# Patient Record
Sex: Male | Born: 1965 | Race: White | Hispanic: No | Marital: Married | Smoking: Never smoker
Health system: Southern US, Community
[De-identification: ages and names within clinical notes are randomized; demographics above are authoritative.]

## PROBLEM LIST (undated history)

## (undated) DIAGNOSIS — Z9842 Cataract extraction status, left eye: Secondary | ICD-10-CM

## (undated) DIAGNOSIS — F329 Major depressive disorder, single episode, unspecified: Secondary | ICD-10-CM

## (undated) DIAGNOSIS — M545 Low back pain, unspecified: Secondary | ICD-10-CM

## (undated) DIAGNOSIS — F419 Anxiety disorder, unspecified: Secondary | ICD-10-CM

## (undated) DIAGNOSIS — H3320 Serous retinal detachment, unspecified eye: Secondary | ICD-10-CM

## (undated) DIAGNOSIS — T7840XA Allergy, unspecified, initial encounter: Secondary | ICD-10-CM

## (undated) DIAGNOSIS — E785 Hyperlipidemia, unspecified: Secondary | ICD-10-CM

## (undated) DIAGNOSIS — F32A Depression, unspecified: Secondary | ICD-10-CM

## (undated) DIAGNOSIS — E039 Hypothyroidism, unspecified: Secondary | ICD-10-CM

## (undated) DIAGNOSIS — F410 Panic disorder [episodic paroxysmal anxiety] without agoraphobia: Secondary | ICD-10-CM

## (undated) DIAGNOSIS — Z9841 Cataract extraction status, right eye: Secondary | ICD-10-CM

## (undated) DIAGNOSIS — H269 Unspecified cataract: Secondary | ICD-10-CM

## (undated) DIAGNOSIS — M1990 Unspecified osteoarthritis, unspecified site: Secondary | ICD-10-CM

## (undated) DIAGNOSIS — R011 Cardiac murmur, unspecified: Secondary | ICD-10-CM

## (undated) HISTORY — DX: Allergy, unspecified, initial encounter: T78.40XA

## (undated) HISTORY — DX: Unspecified osteoarthritis, unspecified site: M19.90

## (undated) HISTORY — DX: Major depressive disorder, single episode, unspecified: F32.9

## (undated) HISTORY — DX: Anxiety disorder, unspecified: F41.9

## (undated) HISTORY — DX: Depression, unspecified: F32.A

## (undated) HISTORY — PX: CATARACT EXTRACTION: SUR2

## (undated) HISTORY — DX: Hyperlipidemia, unspecified: E78.5

## (undated) HISTORY — DX: Low back pain: M54.5

## (undated) HISTORY — PX: EYE SURGERY: SHX253

## (undated) HISTORY — DX: Cardiac murmur, unspecified: R01.1

## (undated) HISTORY — DX: Cataract extraction status, right eye: Z98.41

## (undated) HISTORY — DX: Unspecified cataract: H26.9

## (undated) HISTORY — DX: Hypothyroidism, unspecified: E03.9

## (undated) HISTORY — DX: Low back pain, unspecified: M54.50

## (undated) HISTORY — DX: Serous retinal detachment, unspecified eye: H33.20

## (undated) HISTORY — DX: Panic disorder (episodic paroxysmal anxiety): F41.0

## (undated) HISTORY — DX: Cataract extraction status, right eye: Z98.42

---

## 1998-05-22 ENCOUNTER — Emergency Department (HOSPITAL_COMMUNITY): Admission: EM | Admit: 1998-05-22 | Discharge: 1998-05-23 | Payer: Self-pay | Admitting: Emergency Medicine

## 2000-04-28 ENCOUNTER — Ambulatory Visit (HOSPITAL_COMMUNITY): Admission: RE | Admit: 2000-04-28 | Discharge: 2000-04-28 | Payer: Self-pay | Admitting: Internal Medicine

## 2000-04-28 ENCOUNTER — Encounter: Payer: Self-pay | Admitting: Internal Medicine

## 2005-06-12 ENCOUNTER — Ambulatory Visit: Payer: Self-pay | Admitting: Family Medicine

## 2006-07-09 ENCOUNTER — Ambulatory Visit: Payer: Self-pay | Admitting: Family Medicine

## 2007-04-13 ENCOUNTER — Ambulatory Visit: Payer: Self-pay | Admitting: Family Medicine

## 2007-04-13 LAB — CONVERTED CEMR LAB
AST: 23 units/L (ref 0–37)
Albumin: 4.3 g/dL (ref 3.5–5.2)
Alkaline Phosphatase: 55 units/L (ref 39–117)
BUN: 14 mg/dL (ref 6–23)
Basophils Absolute: 0.1 10*3/uL (ref 0.0–0.1)
Basophils Relative: 1.2 % — ABNORMAL HIGH (ref 0.0–1.0)
CO2: 28 meq/L (ref 19–32)
Chloride: 112 meq/L (ref 96–112)
Creatinine, Ser: 1.2 mg/dL (ref 0.4–1.5)
HCT: 45.7 % (ref 39.0–52.0)
Hemoglobin: 15.8 g/dL (ref 13.0–17.0)
Monocytes Absolute: 0.7 10*3/uL (ref 0.2–0.7)
Neutrophils Relative %: 59.6 % (ref 43.0–77.0)
Potassium: 4 meq/L (ref 3.5–5.1)
RBC: 4.95 M/uL (ref 4.22–5.81)
RDW: 12.3 % (ref 11.5–14.6)
Sodium: 142 meq/L (ref 135–145)
TSH: 15.05 microintl units/mL — ABNORMAL HIGH (ref 0.35–5.50)
Total Bilirubin: 0.9 mg/dL (ref 0.3–1.2)
Total CHOL/HDL Ratio: 5.5
Total Protein: 7.6 g/dL (ref 6.0–8.3)
Triglycerides: 92 mg/dL (ref 0–149)
VLDL: 18 mg/dL (ref 0–40)

## 2007-04-14 ENCOUNTER — Telehealth: Payer: Self-pay | Admitting: Family Medicine

## 2007-04-15 ENCOUNTER — Observation Stay (HOSPITAL_COMMUNITY): Admission: EM | Admit: 2007-04-15 | Discharge: 2007-04-16 | Payer: Self-pay | Admitting: Emergency Medicine

## 2007-04-20 ENCOUNTER — Telehealth (INDEPENDENT_AMBULATORY_CARE_PROVIDER_SITE_OTHER): Payer: Self-pay | Admitting: *Deleted

## 2007-04-29 ENCOUNTER — Encounter: Payer: Self-pay | Admitting: Family Medicine

## 2007-04-29 ENCOUNTER — Ambulatory Visit: Payer: Self-pay | Admitting: Family Medicine

## 2007-04-29 DIAGNOSIS — F411 Generalized anxiety disorder: Secondary | ICD-10-CM | POA: Insufficient documentation

## 2007-05-11 ENCOUNTER — Ambulatory Visit: Payer: Self-pay | Admitting: Family Medicine

## 2007-05-25 ENCOUNTER — Ambulatory Visit: Payer: Self-pay | Admitting: Family Medicine

## 2007-07-01 ENCOUNTER — Telehealth (INDEPENDENT_AMBULATORY_CARE_PROVIDER_SITE_OTHER): Payer: Self-pay | Admitting: *Deleted

## 2007-07-08 ENCOUNTER — Ambulatory Visit: Payer: Self-pay | Admitting: Family Medicine

## 2007-07-12 ENCOUNTER — Telehealth: Payer: Self-pay | Admitting: Family Medicine

## 2007-07-14 ENCOUNTER — Encounter: Payer: Self-pay | Admitting: Family Medicine

## 2007-07-14 LAB — CONVERTED CEMR LAB
Direct LDL: 156.1 mg/dL
TSH: 7.33 microintl units/mL — ABNORMAL HIGH (ref 0.35–5.50)
Total CHOL/HDL Ratio: 6.2
Triglycerides: 130 mg/dL (ref 0–149)

## 2008-01-11 ENCOUNTER — Telehealth: Payer: Self-pay | Admitting: Family Medicine

## 2008-02-07 ENCOUNTER — Telehealth: Payer: Self-pay | Admitting: Family Medicine

## 2008-07-26 ENCOUNTER — Telehealth: Payer: Self-pay | Admitting: Family Medicine

## 2008-08-16 ENCOUNTER — Ambulatory Visit: Payer: Self-pay | Admitting: Family Medicine

## 2008-08-16 LAB — CONVERTED CEMR LAB
Glucose, Urine, Semiquant: NEGATIVE
Nitrite: NEGATIVE
Specific Gravity, Urine: 1.015
WBC Urine, dipstick: NEGATIVE
pH: 8.5

## 2008-08-23 ENCOUNTER — Ambulatory Visit: Payer: Self-pay | Admitting: Family Medicine

## 2008-08-23 DIAGNOSIS — F419 Anxiety disorder, unspecified: Secondary | ICD-10-CM | POA: Insufficient documentation

## 2008-08-23 DIAGNOSIS — F32A Depression, unspecified: Secondary | ICD-10-CM | POA: Insufficient documentation

## 2008-08-23 DIAGNOSIS — F329 Major depressive disorder, single episode, unspecified: Secondary | ICD-10-CM

## 2008-08-23 LAB — CONVERTED CEMR LAB
AST: 22 units/L (ref 0–37)
Albumin: 4.4 g/dL (ref 3.5–5.2)
Alkaline Phosphatase: 48 units/L (ref 39–117)
BUN: 11 mg/dL (ref 6–23)
Basophils Relative: 0.1 % (ref 0.0–3.0)
Creatinine, Ser: 1.1 mg/dL (ref 0.4–1.5)
Eosinophils Relative: 1.9 % (ref 0.0–5.0)
GFR calc Af Amer: 94 mL/min
Glucose, Bld: 89 mg/dL (ref 70–99)
HCT: 45.1 % (ref 39.0–52.0)
HDL: 39 mg/dL (ref >39.0)
Hemoglobin: 16 g/dL (ref 13.0–17.0)
MCV: 91.4 fL (ref 78.0–100.0)
Monocytes Absolute: 0.6 10*3/uL (ref 0.1–1.0)
Monocytes Relative: 8.2 % (ref 3.0–12.0)
Platelets: 249 10*3/uL (ref 150–400)
Potassium: 4.2 meq/L (ref 3.5–5.1)
RBC: 4.94 M/uL (ref 4.22–5.81)
TSH: 6.94 microintl units/mL — ABNORMAL HIGH (ref 0.35–5.50)
Total CHOL/HDL Ratio: 5.1
Total Protein: 7.6 g/dL (ref 6.0–8.3)
WBC: 7.1 10*3/uL (ref 4.5–10.5)

## 2008-10-31 ENCOUNTER — Telehealth: Payer: Self-pay | Admitting: Family Medicine

## 2009-05-02 ENCOUNTER — Telehealth: Payer: Self-pay | Admitting: Family Medicine

## 2009-05-03 ENCOUNTER — Ambulatory Visit: Payer: Self-pay | Admitting: Family Medicine

## 2009-05-03 DIAGNOSIS — E785 Hyperlipidemia, unspecified: Secondary | ICD-10-CM | POA: Insufficient documentation

## 2009-05-03 DIAGNOSIS — F410 Panic disorder [episodic paroxysmal anxiety] without agoraphobia: Secondary | ICD-10-CM | POA: Insufficient documentation

## 2009-05-03 DIAGNOSIS — E039 Hypothyroidism, unspecified: Secondary | ICD-10-CM | POA: Insufficient documentation

## 2009-05-04 ENCOUNTER — Telehealth: Payer: Self-pay | Admitting: Family Medicine

## 2009-05-16 ENCOUNTER — Ambulatory Visit: Payer: Self-pay | Admitting: Family Medicine

## 2009-05-16 DIAGNOSIS — R519 Headache, unspecified: Secondary | ICD-10-CM | POA: Insufficient documentation

## 2009-05-16 DIAGNOSIS — R51 Headache: Secondary | ICD-10-CM | POA: Insufficient documentation

## 2009-05-16 DIAGNOSIS — S139XXA Sprain of joints and ligaments of unspecified parts of neck, initial encounter: Secondary | ICD-10-CM | POA: Insufficient documentation

## 2009-05-16 LAB — CONVERTED CEMR LAB: TSH: 2.69 microintl units/mL (ref 0.35–5.50)

## 2009-10-11 ENCOUNTER — Telehealth: Payer: Self-pay | Admitting: Family Medicine

## 2009-10-11 ENCOUNTER — Ambulatory Visit: Payer: Self-pay | Admitting: Family Medicine

## 2009-10-11 DIAGNOSIS — R413 Other amnesia: Secondary | ICD-10-CM | POA: Insufficient documentation

## 2009-11-13 ENCOUNTER — Telehealth: Payer: Self-pay | Admitting: Family Medicine

## 2009-12-19 ENCOUNTER — Telehealth: Payer: Self-pay | Admitting: Family Medicine

## 2010-03-28 ENCOUNTER — Telehealth: Payer: Self-pay | Admitting: Family Medicine

## 2010-04-02 ENCOUNTER — Ambulatory Visit: Payer: Self-pay | Admitting: Family Medicine

## 2010-04-09 ENCOUNTER — Ambulatory Visit: Payer: Self-pay | Admitting: Family Medicine

## 2010-04-09 DIAGNOSIS — M5136 Other intervertebral disc degeneration, lumbar region: Secondary | ICD-10-CM | POA: Insufficient documentation

## 2010-04-09 LAB — CONVERTED CEMR LAB
Albumin: 4.1 g/dL (ref 3.5–5.2)
Alkaline Phosphatase: 47 units/L (ref 39–117)
Basophils Absolute: 0 10*3/uL (ref 0.0–0.1)
Bilirubin, Direct: 0.1 mg/dL (ref 0.0–0.3)
CO2: 26 meq/L (ref 19–32)
Calcium: 9.2 mg/dL (ref 8.4–10.5)
Creatinine, Ser: 0.9 mg/dL (ref 0.4–1.5)
Eosinophils Absolute: 0.1 10*3/uL (ref 0.0–0.7)
Glucose, Bld: 86 mg/dL (ref 70–99)
Lymphocytes Relative: 26.3 % (ref 12.0–46.0)
MCHC: 34.4 g/dL (ref 30.0–36.0)
Neutrophils Relative %: 63.1 % (ref 43.0–77.0)
Platelets: 247 10*3/uL (ref 150.0–400.0)
RDW: 12.5 % (ref 11.5–14.6)
Total Bilirubin: 0.8 mg/dL (ref 0.3–1.2)

## 2010-04-11 ENCOUNTER — Telehealth: Payer: Self-pay | Admitting: Family Medicine

## 2010-04-18 ENCOUNTER — Ambulatory Visit: Payer: Self-pay | Admitting: Internal Medicine

## 2010-05-10 LAB — CONVERTED CEMR LAB
Glucose, Urine, Semiquant: NEGATIVE
Urobilinogen, UA: 0.2
WBC Urine, dipstick: NEGATIVE

## 2010-10-04 ENCOUNTER — Telehealth: Payer: Self-pay | Admitting: Family Medicine

## 2010-10-16 ENCOUNTER — Ambulatory Visit
Admission: RE | Admit: 2010-10-16 | Discharge: 2010-10-16 | Payer: Self-pay | Source: Home / Self Care | Attending: Family Medicine | Admitting: Family Medicine

## 2010-10-16 ENCOUNTER — Other Ambulatory Visit: Payer: Self-pay | Admitting: Family Medicine

## 2010-10-16 DIAGNOSIS — K589 Irritable bowel syndrome without diarrhea: Secondary | ICD-10-CM | POA: Insufficient documentation

## 2010-10-16 LAB — TSH: TSH: 11.67 u[IU]/mL — ABNORMAL HIGH (ref 0.35–5.50)

## 2010-10-25 ENCOUNTER — Telehealth: Payer: Self-pay | Admitting: Family Medicine

## 2010-11-14 NOTE — Progress Notes (Signed)
Summary: ? about labs  Phone Note Call from Patient Call back at Home Phone [phone]   Caller: Patient--live call Summary of Call: wants nurse to return call about his labwork. has questions. Initial call taken by: Warnell Forester,  October 25, 2010 4:16 PM  Follow-up for Phone Call        Will call results to patient Follow-up by: Judithann Sheen MD,  October 25, 2010 5:38 PM

## 2010-11-14 NOTE — Progress Notes (Signed)
Summary: [facility] CT SCAN OF HEAD  Phone Note Call from Patient Call back at Home Phone [phone]   Caller: Patient Call For: Judithann Sheen MD Summary of Call: PT STATED HE WAS SUPPOSE TO BE [facility] FOR   CT SCAN OF HEAD  DX INCREASE HEADACHES. Initial call taken by: Heron Sabins,  April 11, 2010 10:56 AM  Follow-up for Phone Call        Pt called and said that they just missed a call from someone at [facility], so he was returning the call.  Follow-up by: Lucy Antigua,  April 11, 2010 11:08 AM  Additional Follow-up for Phone Call Additional follow up Details #1::        Pt called and back and Almira Coaster said to tell pt to come by and pick up the script so that pt can mail it himself.  Additional Follow-up by: Lucy Antigua,  April 11, 2010 1:23 PM

## 2010-11-14 NOTE — Assessment & Plan Note (Signed)
Summary: med check/ok per Dr. Marlon Pel   Vital Signs:  Patient profile:   45 year old male Weight:      192 pounds O2 Sat:      96 % on Room air Temp:     100.4 degrees F oral Pulse rate:   92 / minute Pulse rhythm:   regular BP sitting:   140 / 98  (left arm) Cuff size:   regular  Vitals Entered By: Kern Reap CMA Duncan Dull) (October 16, 2010 1:04 PM)  O2 Flow:  Room air CC: follow-up visit, depression Is Patient Diabetic? No   History of Present Illness: repeat blood pressure 18/43 This 45 year old white ORIF male is in today very depressed also anxiety. 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 Would like to have his medications refilled that her due He continues to have back and leg pain and needs his Percocet refilled Spent approximately 30 minutes talking to him about his life and job situation as well as his girlfriend the patient over the last 2 weeks has had some increase bowel activity no true diarrhea but some discomfort over the lower abdomen  Allergies: 1)  ! Prozac 2)  ! * Lexpro 3)  ! * Antidepressants 4)  ! Effexor  Past History:  Past Surgical History: Last updated: 04/29/2007 Cataract extraction  Risk Factors: Smoking Status: never (04/09/2010)  Past Medical History: Anxiety panic attacks Low back pain Past retinal left eye at age 51 History of bilateral cataract hypothyroidism  Review of Systems      See HPI  The patient denies anorexia, fever, weight loss, weight gain, vision loss, decreased hearing, hoarseness, chest pain, syncope, dyspnea on exertion, peripheral edema, prolonged cough, headaches, hemoptysis, abdominal pain, melena, hematochezia, severe indigestion/heartburn, hematuria, incontinence, genital sores, muscle weakness, suspicious  skin lesions, transient blindness, difficulty walking, depression, unusual weight change, abnormal bleeding, enlarged lymph nodes, angioedema, breast masses, and testicular masses.    Physical Exam  General:  Well-developed,well-nourished,in no acute distress; alert,appropriate and cooperative throughout examination Head:  Normocephalic and atraumatic without obvious abnormalities. No apparent alopecia or balding. Eyes:  has had bilateral cataracts removed does continue to have some visual problem of long-standing Ears:  External ear exam shows no significant lesions or deformities.  Otoscopic examination reveals clear canals, tympanic membranes are intact bilaterally without bulging, retraction, inflammation or discharge. Hearing is grossly normal bilaterally. Nose:  External nasal examination shows no deformity or inflammation. Nasal mucosa are pink and moist without lesions or exudates. Mouth:  Oral mucosa and oropharynx without lesions or exudates.  Teeth in good repair. Neck:  No deformities, masses, or tenderness noted. Chest Wall:  No deformities, masses, tenderness or gynecomastia noted. Lungs:  Normal respiratory effort, chest expands symmetrically. Lungs are clear to auscultation, no crackles or wheezes. Heart:  Normal rate and regular rhythm. S1 and S2 normal without gallop, murmur, click, rub or other extra sounds. Abdomen:  increased bowel sounds minimal abdominal tenderness no masses liver spleen and kidneys are normal   Impression & Recommendations:  Problem # 1:  BACK PAIN, LUMBAR, CHRONIC (ICD-724.2) Assessment Unchanged  His updated medication list for this problem includes:    Adult Aspirin Ec Low Strength 81 Mg Tbec (Aspirin)    Endocet  10-650 Mg Tabs (Oxycodone-acetaminophen) .Marland Kitchen... 1 by mouth every 4-6 hrs as needed pain not to exceed 4 per day for back pain.  Problem # 2:  MEMORY LOSS (ICD-780.93) Assessment: Unchanged  Problem # 3:  HEADACHE  (ICD-784.0) Assessment: Unchanged  His updated medication list for this problem includes:    Adult Aspirin Ec Low Strength 81 Mg Tbec (Aspirin)    Endocet 10-650 Mg Tabs (Oxycodone-acetaminophen) .Marland Kitchen... 1 by mouth every 4-6 hrs as needed pain not to exceed 4 per day for back pain.  Problem # 4:  PANIC DISORDER (ICD-300.01) Assessment: Improved  His updated medication list for this problem includes:    Clonazepam 2 Mg Tabs (Clonazepam) .Marland Kitchen... 1 three times a day  Problem # 5:  HYPOTHYROIDISM (ICD-244.9) Assessment: Deteriorated  The following medications were removed from the medication list:    Synthroid 150 Mcg Tabs (Levothyroxine sodium) .Marland Kitchen... 1 qd His updated medication list for this problem includes:    Synthroid 175 Mcg Tabs (Levothyroxine sodium) ..... One tablet q.d.  Orders: Venipuncture (16109) Specimen Handling (60454) Venipuncture (09811) TLB-TSH (Thyroid Stimulating Hormone) (84443-TSH)  Problem # 6:  HYPERLIPIDEMIA (ICD-272.4) Assessment: Improved  His updated medication list for this problem includes:    Pravastatin Sodium 40 Mg Tabs (Pravastatin sodium) .Marland Kitchen... Take 1 tablet by mouth once a day  Problem # 7:  DEPRESSION (ICD-311) Assessment: Deteriorated  His updated medication list for this problem includes:    Clonazepam 2 Mg Tabs (Clonazepam) .Marland Kitchen... 1 three times a day  Problem # 8:  ANXIETY (ICD-300.00) Assessment: Unchanged  His updated medication list for this problem includes:    Clonazepam 2 Mg Tabs (Clonazepam) .Marland Kitchen... 1 three times a day  Problem # 9:  IRRITABLE BOWEL SYNDROME (ICD-564.1) Assessment: New Align 1 qd  Complete Medication List: 1)  Pravastatin Sodium 40 Mg Tabs (Pravastatin sodium) .... Take 1 tablet by mouth once a day 2)  Adult Aspirin Ec Low Strength 81 Mg Tbec (Aspirin) 3)  Clonazepam 2 Mg Tabs (Clonazepam) .Marland Kitchen.. 1 three times a day 4)  Endocet 10-650 Mg Tabs (Oxycodone-acetaminophen) .Marland Kitchen.. 1 by mouth every 4-6 hrs as needed pain  not to exceed 4 per day for back pain. 5)  Daily-vitamin Tabs (Multiple vitamin) .... Once daily 6)  Cialis 5 Mg Tabs (Tadalafil) .Marland Kitchen.. 1 tab qd 7)  Viagra 100 Mg Tabs (Sildenafil citrate) .Marland Kitchen.. 1  tab 1 hr before loving 8)  Synthroid 175 Mcg Tabs (Levothyroxine sodium) .... One tablet q.d.  Patient Instructions: 1)  2 increase Synthroid 175 mark round q.d. 2)  4 IVS decreased roughage in diet as well as start taking a lign 3)  Continue regular medications Prescriptions: SYNTHROID 175 MCG TABS (LEVOTHYROXINE SODIUM) one tablet q.d.  #90 x 3   Entered and Authorized by:   Judithann Sheen MD   Signed by:   Judithann Sheen MD on 10/25/2010   Method used:   Faxed to ...       [facility] (mail-order)             , [place]         Ph:        Fax: [phone]   RxID:   [id number] VIAGRA 100 MG TABS (SILDENAFIL CITRATE) 1  tab 1 hr before loving  #15 x 3   Entered and Authorized by:   Judithann Sheen MD   Signed by:   Judithann Sheen MD on 10/16/2010   Method  used:   Faxed to ...       [facility] (mail-order)             , [place]         Ph:        Fax: [phone]   RxID:   [id number] CIALIS 5 MG TABS (TADALAFIL) 1 tab qd  #30 x 11   Entered and Authorized by:   Judithann Sheen MD   Signed by:   Judithann Sheen MD on 10/16/2010   Method used:   Print then Give to Patient   RxID:   [id number] ENDOCET 10-650 MG TABS (OXYCODONE-ACETAMINOPHEN) 1 by mouth every 4-6 hrs as needed pain not to exceed 4 per day for back pain.  #360 x 0   Entered and Authorized by:   Judithann Sheen MD   Signed by:   Judithann Sheen MD on 10/16/2010   Method used:   Print then Give to Patient   RxID:   [id number] CLONAZEPAM 2 MG TABS (CLONAZEPAM) 1 three times a day  #270 x 0   Entered and Authorized by:   Judithann Sheen MD   Signed by:   Judithann Sheen MD on 10/16/2010   Method used:   Print then Give to Patient   RxID:    [id number]    Orders Added: 1)  Venipuncture [64332] 2)  Specimen Handling [99000] 3)  Venipuncture [36415] 4)  TLB-TSH (Thyroid Stimulating Hormone) [84443-TSH] 5)  Est. Patient Level IV [95188]

## 2010-11-14 NOTE — Progress Notes (Signed)
Summary: [facility] RX MAILED  Phone Note Call from Patient Call back at Home Phone [phone]   Caller: Patient Call For: Judithann Sheen MD Summary of Call: pt states endocet must be mail to [facility]  address [address] [place] , [place] [address]. PT WOULD LIKE A CALL BACK ONCE RX HAS BEEN MAILED.  Initial call taken by: Heron Sabins,  April 11, 2010 10:49 AM  Follow-up for Phone Call        ok pt to pick up rx  Follow-up by: Pura Spice, RN,  April 11, 2010 1:22 PM

## 2010-11-14 NOTE — Progress Notes (Signed)
Summary: new med  endocet x 1 only  Phone Note Call from Patient Call back at Home Phone [phone]   Caller: Patient Call For: Judithann Sheen MD Summary of Call: pt no longer want lorcet 10-650. Pt would  like endocet 10-650 same directions  as lorcet #300 for 90 day supply for [facility]. Pt will mail rx also pt is requesting to pick up rx today,pt is aware it may not be ready today. Initial call taken by: Heron Sabins,  December 19, 2009 2:40 PM  Follow-up for Phone Call        was the lorcet mail order for him and if so [facility] or [facility]  Follow-up by: Pura Spice, RN,  December 19, 2009 3:06 PM  Additional Follow-up for Phone Call Additional follow up Details #1::        The lorcet, was [facility], is not taking away the pain.  Is requesting endocet instead, which would also be [facility].  Says had hard copy for the lorcet, but he never got it filled, and when he was here, Dr. Scotty Court tried to send, but may have had wrong address.  He will come by for Rx or send to [facility], [address], [place], [place] [address].  Member ID [id number], his address.  [address] [place], [place] [address], DOB May 19, 2066, Medical Reason Back Pain must all be on Rx Oxycodone generic endocet 10-650mg  #300 for 90 day supply.  It will take him about 25 minutes to get here if you want him to send the Rx himself.  Rx cannot be faxed, it has to be mailed. Additional Follow-up by: Rudy Jew, RN,  December 19, 2009 3:30 PM    Additional Follow-up for Phone Call Additional follow up Details #2::    ok and can pick up today  Follow-up by: Pura Spice, RN,  December 20, 2009 8:10 AM  New/Updated Medications: CYMBALTA 60 MG CPEP (DULOXETINE HCL) 2 per day for depression ENDOCET 10-650 MG TABS (OXYCODONE-ACETAMINOPHEN) 1 by mouth every 4-6 hrs as needed pain not to exceed 4 per day Prescriptions: ENDOCET 10-650 MG TABS (OXYCODONE-ACETAMINOPHEN) 1 by mouth every 4-6 hrs as needed pain not to exceed 4  per day  #120 x 0   Entered by:   Pura Spice, RN   Authorized by:   Judithann Sheen MD   Signed by:   Pura Spice, RN on 12/20/2009   Method used:   Print then Give to Patient   RxID:   [id number]   Appended Document: new med  endocet x 1 only pt requested that this needs to be 90 day supply  rx reprinted for quanity 360.

## 2010-11-14 NOTE — Assessment & Plan Note (Signed)
Summary: CPX/CJR   Vital Signs:  Patient profile:   45 year old male Weight:      191 pounds O2 Sat:      98 % Temp:     98.8 degrees F Pulse rate:   95 / minute Pulse rhythm:   regular BP sitting:   120 / 80  (left arm) Cuff size:   large  Vitals Entered By: Pura Spice, RN (April 09, 2010 4:44 PM) CC: cpx bleeds with BM's    History of Present Illness: This 45 year old white male was in for complete physical examination planes of a stressful job\par Plan his main complaint is short-term memory loss which has increased in severity in addition he has had headaches for some time but they have HEENT crease in frequency and severity needing oxycodone to relieve his pain Past pain no lower back is stiff in the morning requiring him to set to function later passed out lifting heavy weights which he did in the past year he occasionally goes to a chiropractor who realized his spine or pops veins back in place according to the patient Clonazepam helps him very much as far as his stress since he is a very anxious and stressful individual He has continued to take his pravastatin and Synthroid but stopped Cymbalta  Preventive Screening-Counseling & Management  Alcohol-Tobacco     Smoking Status: never  Allergies: 1)  ! Prozac 2)  ! * Lexpro 3)  ! * Antidepressants 4)  ! Effexor  Past History:  Past Surgical History: Last updated: 04/29/2007 Cataract extraction  Risk Factors: Smoking Status: never (04/09/2010)  Past Medical History: Anxiety panic attacks Low back pain Past retinal left eye at age 79 History of bilateral cataract  Social History: Smoking Status:  never  Review of Systems      See HPI  The patient denies anorexia, fever, weight loss, weight gain, vision loss, decreased hearing, hoarseness, chest pain, syncope, dyspnea on exertion, peripheral edema, prolonged cough, headaches, hemoptysis, abdominal pain, melena, hematochezia, severe indigestion/heartburn,  hematuria, incontinence, genital sores, muscle weakness, suspicious skin lesions, transient blindness, difficulty walking, depression, unusual weight change, abnormal bleeding, enlarged lymph nodes, angioedema, breast masses, and testicular masses.    Physical Exam  General:  Well-developed,well-nourished,in no acute distress; alert,appropriate and cooperative throughout examination Head:  Normocephalic and atraumatic without obvious abnormalities. No apparent alopecia or balding. Eyes:  slight strabismus left pupil irregular due to previous surgery and also cataract removal years ago resulting from injury at one time Ears:  External ear exam shows no significant lesions or deformities.  Otoscopic examination reveals clear canals, tympanic membranes are intact bilaterally without bulging, retraction, inflammation or discharge. Hearing is grossly normal bilaterally. Nose:  External nasal examination shows no deformity or inflammation. Nasal mucosa are pink and moist without lesions or exudates. Mouth:  Oral mucosa and oropharynx without lesions or exudates.  Teeth in good repair. Neck:  No deformities, masses, or tenderness noted. Chest Wall:  No deformities, masses, tenderness or gynecomastia noted. Breasts:  No masses or gynecomastia noted Lungs:  Normal respiratory effort, chest expands symmetrically. Lungs are clear to auscultation, no crackles or wheezes. Heart:  Normal rate and regular rhythm. S1 and S2 normal without gallop, murmur, click, rub or other extra sounds. Abdomen:  Bowel sounds positive,abdomen soft and non-tender without masses, organomegaly or hernias noted. Rectal:  No external abnormalities noted. Normal sphincter tone. No rectal masses or tenderness. Genitalia:  Testes bilaterally descended without nodularity, tenderness or masses. No  scrotal masses or lesions. No penis lesions or urethral discharge. Prostate:  Prostate gland firm and smooth, no enlargement, nodularity,  tenderness, mass, asymmetry or induration. Msk:  No deformity or scoliosis noted of thoracic or lumbar spine.   Pulses:  R and L carotid,radial,femoral,dorsalis pedis and posterior tibial pulses are full and equal bilaterally Extremities:  No clubbing, cyanosis, edema, or deformity noted with normal full range of motion of all joints.   Neurologic:  No cranial nerve deficits noted. Station and gait are normal. Plantar reflexes are down-going bilaterally. DTRs are symmetrical throughout. Sensory, motor and coordinative functions appear intact. Skin:  Intact without suspicious lesions or rashes Cervical Nodes:  No lymphadenopathy noted Axillary Nodes:  No palpable lymphadenopathy Inguinal Nodes:  No significant adenopathy Psych:  Cognition and judgment appear intact. Alert and cooperative with normal attention span and concentration. No apparent delusions, illusions, hallucinations   Impression & Recommendations:  Problem # 1:  MEMORY LOSS (ICD-780.93) Assessment Deteriorated  Orders: Radiology Referral (Radiology)  Problem # 2:  HEADACHE (ICD-784.0) Assessment: Deteriorated  His updated medication list for this problem includes:    Adult Aspirin Ec Low Strength 81 Mg Tbec (Aspirin)    Endocet 10-650 Mg Tabs (Oxycodone-acetaminophen) .Marland Kitchen... 1 by mouth every 4-6 hrs as needed pain not to exceed 4 per day for back pain.  Orders: Radiology Referral (Radiology)  Problem # 3:  HYPERLIPIDEMIA (ICD-272.4) Assessment: Improved  His updated medication list for this problem includes:    Pravastatin Sodium 40 Mg Tabs (Pravastatin sodium) .Marland Kitchen... Take 1 tablet by mouth once a day  Problem # 4:  PANIC DISORDER (ICD-300.01) Assessment: Improved  The following medications were removed from the medication list:    Cymbalta 60 Mg Cpep (Duloxetine hcl) .Marland Kitchen... 2 per day for depression His updated medication list for this problem includes:    Clonazepam 2 Mg Tabs (Clonazepam) .Marland Kitchen... 1 three times a  day  Problem # 5:  PHYSICAL EXAMINATION (ICD-V70.0) Assessment: Unchanged  Problem # 6:  BACK PAIN, LUMBAR, CHRONIC (ICD-724.2) Assessment: Unchanged  His updated medication list for this problem includes:    Adult Aspirin Ec Low Strength 81 Mg Tbec (Aspirin)    Endocet 10-650 Mg Tabs (Oxycodone-acetaminophen) .Marland Kitchen... 1 by mouth every 4-6 hrs as needed pain not to exceed 4 per day for back pain.  Complete Medication List: 1)  Pravastatin Sodium 40 Mg Tabs (Pravastatin sodium) .... Take 1 tablet by mouth once a day 2)  Adult Aspirin Ec Low Strength 81 Mg Tbec (Aspirin) 3)  Synthroid 150 Mcg Tabs (Levothyroxine sodium) .Marland Kitchen.. 1 qd 4)  Clonazepam 2 Mg Tabs (Clonazepam) .Marland Kitchen.. 1 three times a day 5)  Endocet 10-650 Mg Tabs (Oxycodone-acetaminophen) .Marland Kitchen.. 1 by mouth every 4-6 hrs as needed pain not to exceed 4 per day for back pain.  Patient Instructions: 1)  impression is that since you're having increased memory loss and increased her headaches we should do it CT scan for full evaluation 2)  Continue her other medications as prescribed Prescriptions: ENDOCET 10-650 MG TABS (OXYCODONE-ACETAMINOPHEN) 1 by mouth every 4-6 hrs as needed pain not to exceed 4 per day for back pain.  #360 x 0   Entered by:   Pura Spice, RN   Authorized by:   Judithann Sheen MD   Signed by:   Pura Spice, RN on 04/11/2010   Method used:   Print then Give to Patient   RxID:   [id number] PRAVASTATIN SODIUM 40 MG TABS (PRAVASTATIN  SODIUM) Take 1 tablet by mouth once a day  #90 x 3   Entered and Authorized by:   Judithann Sheen MD   Signed by:   Judithann Sheen MD on 04/09/2010   Method used:   Electronically to        [facility] [address] (retail)       [phone]       [place], [place]  [address]       Ph: [phone]       Fax: [phone]   RxID:   [id number] SYNTHROID 150 MCG TABS (LEVOTHYROXINE SODIUM) 1 QD  #90 x 3   Entered and  Authorized by:   Judithann Sheen MD   Signed by:   Judithann Sheen MD on 04/09/2010   Method used:   Printed then faxed to ...       [facility] (mail-order)             , [place]         Ph:        Fax: [phone]   RxID:   [id number] CLONAZEPAM 2 MG TABS (CLONAZEPAM) 1 three times a day  #270 x 3   Entered and Authorized by:   Judithann Sheen MD   Signed by:   Judithann Sheen MD on 04/09/2010   Method used:   Printed then faxed to ...       [facility] (mail-order)             , [place]         Ph:        Fax: [phone]   RxID:   [id number] ENDOCET 10-650 MG TABS (OXYCODONE-ACETAMINOPHEN) 1 by mouth every 4-6 hrs as needed pain not to exceed 4 per day for back pain.  #360 x 0   Entered and Authorized by:   Judithann Sheen MD   Signed by:   Judithann Sheen MD on 04/09/2010   Method used:   Printed then faxed to ...       [facility] (mail-order)             , [place]         Ph:        Fax: [phone]   RxID:   [id number] CLONAZEPAM 2 MG TABS (CLONAZEPAM) 1 three times a day  #270 x 3   Entered and Authorized by:   Judithann Sheen MD   Signed by:   Judithann Sheen MD on 04/09/2010   Method used:   Printed then faxed to ...       [facility] (mail-order)             , [place]         Ph:        Fax: [phone]   RxID:   [id number] SYNTHROID 150 MCG TABS (LEVOTHYROXINE SODIUM) 1 QD  #90 x 3   Entered and Authorized by:   Judithann Sheen MD   Signed by:   Judithann Sheen MD on 04/09/2010   Method used:   F[facility]ed to ...       [facility]             , [place]         Ph:        Fax:  [phone]   RxID:   [id number]

## 2010-11-14 NOTE — Progress Notes (Signed)
Summary: Pt req Prevastatin-[facility],Endocet,Clonazepam,Synthroid-[facility]  Phone Note Call from Patient Call back at Home Phone [phone]   Caller: Patient Summary of Call: Pt called is req script for Prevastatin to [facility] on [address], then Edocet ,Clonazepam,  and Synthroid to [facility]. Pls notify pt when this has been done.  Initial call taken by: Lucy Antigua,  March 28, 2010 1:28 PM  Follow-up for Phone Call        this will need to be done by Dr. Scotty Court next week Follow-up by: Nelwyn Salisbury MD,  March 29, 2010 8:51 AM  Additional Follow-up for Phone Call Additional follow up Details #1::        jpt needs cpx in july yearly with lab studies to be done so we will do his rx at time of  cpx  if need them now will  do 1 month refill Additional Follow-up by: Pura Spice, RN,  April 01, 2010 9:39 AM    Additional Follow-up for Phone Call Additional follow up Details #2::    Pt has sch cpx labs for 04/02/10 and cpx on 04/09/10. Pt req a 3 month refill on these meds because just getting 1 month supply is too expensive. Getting 3 months saves money.    Follow-up by: Lucy Antigua,  April 01, 2010 10:24 AM  Additional Follow-up for Phone Call Additional follow up Details #3:: Details for Additional Follow-up Action Taken: spoke with pt having labs in am.  pt thinks has enough pills to last til cpx on june 28 11 and will call in pravastatin to [facility].  Additional Follow-up by: Pura Spice, RN,  April 01, 2010 10:59 AM  New/Updated Medications: PRAVASTATIN SODIUM 40 MG TABS (PRAVASTATIN SODIUM) Take 1 tablet by mouth once a day Prescriptions: PRAVASTATIN SODIUM 40 MG TABS (PRAVASTATIN SODIUM) Take 1 tablet by mouth once a day  #90 x 3   Entered by:   Pura Spice, RN   Authorized by:   Judithann Sheen MD   Signed by:   Pura Spice, RN on 04/01/2010   Method used:   Electronically to        [facility] [address] (retail)       [phone]       [place], [place]  [address]       Ph: [phone]       Fax: [phone]   RxID:   [id number]

## 2010-11-14 NOTE — Progress Notes (Signed)
Summary: Pt req higher dose of Cymbalta. Pt wants a return call asap  Phone Note Call from Patient Call back at Home Phone [phone]   Caller: Patient Summary of Call: Pt said that he would like a higher dose of Cymbalta. Pt would like Dr. Scotty Court to call him.  Initial call taken by: Lucy Antigua,  November 13, 2009 3:08 PM  Follow-up for Phone Call        Pt called to ck on status of phone call he made to [facility] on Tuesday, 11/13/2009..... Pt now advising that he would like to have some Cymbalta samples left up front for him because he is currently out of medicine..... Pt adv that he is currently OOT and would like to have Dr Scotty Court call him if possible.  Follow-up by: Debbra Riding,  November 15, 2009 2:19 PM     Appended Document: Pt req higher dose of Cymbalta. Pt wants a return call asap increase Cymbalta 120 mg per day, will fax a prescription to [facility]

## 2010-11-14 NOTE — Progress Notes (Signed)
Summary: REQUEST FOR APPT / PLEASE RETURN CALL  Phone Note Call from Patient Call back at Home Phone [phone]   Caller: Patient  [phone] Summary of Call: Pt called to adv that he needs to come in for a med ck /  refill appt before Jan. 20, 2012 (that is when his insurance will expire due to being layed off of work).... Pt would like to be worked into schedule before 11/01/10.... Pt would also like a return call to  #  [phone] - adv he needs to speak with Dr Scotty Court, would not elaborate further... Declined OV with any other physician for any acute needs?   Initial call taken by: Debbra Riding,  October 04, 2010 3:05 PM  Follow-up for Phone Call        ok to work in but he will need to ask questions at OV Follow-up by: Alfred Levins, CMA,  October 09, 2010 11:36 AM  Additional Follow-up for Phone Call Additional follow up Details #1::        I called and lft vm for pt to cb to sch work in ov with Dr Scotty Court. Waiting on call back.  Additional Follow-up by: Lucy Antigua,  October 09, 2010 12:00 PM    Additional Follow-up for Phone Call Additional follow up Details #2::    Pt returned my call and has been sch for rov med check appt for 10/16/10 at 1pm, as noted.  Follow-up by: Lucy Antigua,  October 09, 2010 1:22 PM

## 2010-12-11 ENCOUNTER — Telehealth: Payer: Self-pay | Admitting: Family Medicine

## 2010-12-11 NOTE — Telephone Encounter (Signed)
Triage vm-----checking status of forms to be completed for [facility] [employer]. Wants Dr Scotty Court to retun call.

## 2010-12-17 ENCOUNTER — Telehealth: Payer: Self-pay | Admitting: Family Medicine

## 2010-12-17 NOTE — Telephone Encounter (Signed)
Pt called to ck on paperwork ([employer]) that he dropped off week before last.... He called to ck on paperwork last week but no one returned his call .... Wants to know if it is ready and when he can come by to p/u the paperwork...Marland KitchenMarland Kitchen pts # [phone].

## 2011-02-13 ENCOUNTER — Other Ambulatory Visit: Payer: Self-pay

## 2011-02-13 MED ORDER — CLONAZEPAM 2 MG PO TABS: ORAL_TABLET | ORAL | Status: DC

## 2011-02-13 MED ORDER — LEVOTHYROXINE SODIUM 175 MCG PO TABS: 175.0000 ug | ORAL_TABLET | Freq: Every day | ORAL | Status: DC

## 2011-02-13 NOTE — Telephone Encounter (Signed)
Pt called and stated that he does not have insurance any more and that he needed for synthroid 175 #120 and Clonazepam 2mg  #270 ok per Dr. Scotty Court to call in to [facility]

## 2011-02-22 LAB — HM DIABETES EYE EXAM
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
## 2011-04-14 ENCOUNTER — Other Ambulatory Visit: Payer: Self-pay | Admitting: Family Medicine

## 2011-04-14 NOTE — Telephone Encounter (Signed)
Pt would like a call when his refill from [facility] is done.

## 2011-07-30 ENCOUNTER — Other Ambulatory Visit: Payer: Self-pay | Admitting: Family Medicine

## 2011-10-08 ENCOUNTER — Ambulatory Visit (INDEPENDENT_AMBULATORY_CARE_PROVIDER_SITE_OTHER): Payer: BC Managed Care – PPO | Admitting: Internal Medicine

## 2011-10-08 ENCOUNTER — Other Ambulatory Visit (INDEPENDENT_AMBULATORY_CARE_PROVIDER_SITE_OTHER): Payer: BC Managed Care – PPO

## 2011-10-08 ENCOUNTER — Encounter: Payer: Self-pay | Admitting: Internal Medicine

## 2011-10-08 DIAGNOSIS — M545 Low back pain, unspecified: Secondary | ICD-10-CM

## 2011-10-08 DIAGNOSIS — F410 Panic disorder [episodic paroxysmal anxiety] without agoraphobia: Secondary | ICD-10-CM

## 2011-10-08 DIAGNOSIS — E785 Hyperlipidemia, unspecified: Secondary | ICD-10-CM

## 2011-10-08 DIAGNOSIS — Z0000 Encounter for general adult medical examination without abnormal findings: Secondary | ICD-10-CM

## 2011-10-08 LAB — URINALYSIS, ROUTINE W REFLEX MICROSCOPIC
Leukocytes, UA: NEGATIVE
Nitrite: NEGATIVE
Specific Gravity, Urine: 1.025 (ref 1.000–1.030)
pH: 6 (ref 5.0–8.0)

## 2011-10-08 LAB — COMPREHENSIVE METABOLIC PANEL
Alkaline Phosphatase: 54 U/L (ref 39–117)
BUN: 13 mg/dL (ref 6–23)
Creatinine, Ser: 0.9 mg/dL (ref 0.4–1.5)
Glucose, Bld: 93 mg/dL (ref 70–99)
Sodium: 141 mEq/L (ref 135–145)
Total Bilirubin: 0.6 mg/dL (ref 0.3–1.2)

## 2011-10-08 LAB — TSH: TSH: 0.18 u[IU]/mL — ABNORMAL LOW (ref 0.35–5.50)

## 2011-10-08 LAB — HIV ANTIBODY (ROUTINE TESTING W REFLEX): HIV: NONREACTIVE

## 2011-10-08 LAB — CBC WITH DIFFERENTIAL/PLATELET
Basophils Relative: 0.3 % (ref 0.0–3.0)
Eosinophils Relative: 1.5 % (ref 0.0–5.0)
HCT: 44.5 % (ref 39.0–52.0)
Lymphs Abs: 2.1 10*3/uL (ref 0.7–4.0)
MCV: 91.6 fl (ref 78.0–100.0)
Monocytes Absolute: 0.6 10*3/uL (ref 0.1–1.0)
Platelets: 292 10*3/uL (ref 150.0–400.0)
WBC: 8.8 10*3/uL (ref 4.5–10.5)

## 2011-10-08 LAB — LIPID PANEL: HDL: 43.4 mg/dL (ref >39.00)

## 2011-10-08 MED ORDER — CLONAZEPAM 2 MG PO TABS: ORAL_TABLET | ORAL | Status: DC

## 2011-10-08 MED ORDER — PRAVASTATIN SODIUM 40 MG PO TABS: 40.0000 mg | ORAL_TABLET | Freq: Every day | ORAL | Status: DC

## 2011-10-08 MED ORDER — OXYCODONE-ACETAMINOPHEN 10-650 MG PO TABS: 1.0000 | ORAL_TABLET | Freq: Four times a day (QID) | ORAL | Status: DC | PRN

## 2011-10-08 NOTE — Patient Instructions (Signed)
Health Maintenance, Males A healthy lifestyle and preventative care can promote health and wellness.  Maintain regular health, dental, and eye exams.   Eat a healthy diet. Foods like vegetables, fruits, whole grains, low-fat dairy products, and lean protein foods contain the nutrients you need without too many calories. Decrease your intake of foods high in solid fats, added sugars, and salt. Get information about a proper diet from your caregiver, if necessary.   Regular physical exercise is one of the most important things you can do for your health. Most adults should get at least 150 minutes of moderate-intensity exercise (any activity that increases your heart rate and causes you to sweat) each week. In addition, most adults need muscle-strengthening exercises on 2 or more days a week.    Maintain a healthy weight. The body mass index (BMI) is a screening tool to identify possible weight problems. It provides an estimate of body fat based on height and weight. Your caregiver can help determine your BMI, and can help you achieve or maintain a healthy weight. For adults 20 years and older:   A BMI below 18.5 is considered underweight.   A BMI of 18.5 to 24.9 is normal.   A BMI of 25 to 29.9 is considered overweight.   A BMI of 30 and above is considered obese.   Maintain normal blood lipids and cholesterol by exercising and minimizing your intake of saturated fat. Eat a balanced diet with plenty of fruits and vegetables. Blood tests for lipids and cholesterol should begin at age 20 and be repeated every 5 years. If your lipid or cholesterol levels are high, you are over 50, or you are a high risk for heart disease, you may need your cholesterol levels checked more frequently.Ongoing high lipid and cholesterol levels should be treated with medicines, if diet and exercise are not effective.   If you smoke, find out from your caregiver how to quit. If you do not use tobacco, do not start.    If you choose to drink alcohol, do not exceed 2 drinks per day. One drink is considered to be 12 ounces (355 mL) of beer, 5 ounces (148 mL) of wine, or 1.5 ounces (44 mL) of liquor.   Avoid use of street drugs. Do not share needles with anyone. Ask for help if you need support or instructions about stopping the use of drugs.   High blood pressure causes heart disease and increases the risk of stroke. Blood pressure should be checked at least every 1 to 2 years. Ongoing high blood pressure should be treated with medicines if weight loss and exercise are not effective.   If you are 45 to 45 years old, ask your caregiver if you should take aspirin to prevent heart disease.   Diabetes screening involves taking a blood sample to check your fasting blood sugar level. This should be done once every 3 years, after age 45, if you are within normal weight and without risk factors for diabetes. Testing should be considered at a younger age or be carried out more frequently if you are overweight and have at least 1 risk factor for diabetes.   Colorectal cancer can be detected and often prevented. Most routine colorectal cancer screening begins at the age of 50 and continues through age 75. However, your caregiver may recommend screening at an earlier age if you have risk factors for colon cancer. On a yearly basis, your caregiver may provide home test kits to check for hidden   blood in the stool. Use of a small camera at the end of a tube, to directly examine the colon (sigmoidoscopy or colonoscopy), can detect the earliest forms of colorectal cancer. Talk to your caregiver about this at age 50, when routine screening begins. Direct examination of the colon should be repeated every 5 to 10 years through age 75, unless early forms of pre-cancerous polyps or small growths are found.   Healthy men should no longer receive prostate-specific antigen (PSA) blood tests as part of routine cancer screening. Consult with  your caregiver about prostate cancer screening.   Practice safe sex. Use condoms and avoid high-risk sexual practices to reduce the spread of sexually transmitted infections (STIs).   Use sunscreen with a sun protection factor (SPF) of 30 or greater. Apply sunscreen liberally and repeatedly throughout the day. You should seek shade when your shadow is shorter than you. Protect yourself by wearing long sleeves, pants, a wide-brimmed hat, and sunglasses year round, whenever you are outdoors.   Notify your caregiver of new moles or changes in moles, especially if there is a change in shape or color. Also notify your caregiver if a mole is larger than the size of a pencil eraser.   A one-time screening for abdominal aortic aneurysm (AAA) and surgical repair of large AAAs by sound wave imaging (ultrasonography) is recommended for ages 65 to 75 years who are current or former smokers.   Stay current with your immunizations.  Document Released: 03/27/2008 Document Revised: 06/11/2011 Document Reviewed: 02/24/2011 [employer] Patient Information 2012 [employer]. 

## 2011-10-08 NOTE — Progress Notes (Signed)
Subjective:    Patient ID: Jonathan Luna, male    DOB: 1966/08/18, 45 y.o.   MRN: [id number]  HPI  New to me for a complete physical, he wants an HIV test done. He needs meds refilled but states his medical issues are stable with no recent changes or worsening. He does not want any vaccines today b/c his insurance does not cover them.  Review of Systems  Constitutional: Negative for fever, chills, diaphoresis, activity change, appetite change, fatigue and unexpected weight change.  HENT: Negative.   Eyes: Negative.   Respiratory: Negative for cough, chest tightness, shortness of breath, wheezing and stridor.   Cardiovascular: Negative for chest pain, palpitations and leg swelling.  Gastrointestinal: Negative.   Genitourinary: Negative for dysuria, urgency, frequency, hematuria, flank pain, decreased urine volume, discharge, penile swelling, scrotal swelling, enuresis, difficulty urinating, genital sores, penile pain and testicular pain.  Musculoskeletal: Positive for back pain (chronic, unchanged). Negative for myalgias, joint swelling, arthralgias and gait problem.  Skin: Negative for color change, pallor, rash and wound.  Neurological: Negative.   Hematological: Negative for adenopathy. Does not bruise/bleed easily.  Psychiatric/Behavioral: Negative for suicidal ideas, hallucinations, behavioral problems, confusion, sleep disturbance, self-injury, dysphoric mood, decreased concentration and agitation. The patient is nervous/anxious (chronic anxiety and panic, no recent changes). The patient is not hyperactive.        Objective:   Physical Exam  Vitals reviewed. Constitutional: He appears well-developed and well-nourished. No distress.  HENT:  Head: Normocephalic and atraumatic.  Mouth/Throat: Oropharynx is clear and moist. No oropharyngeal exudate.  Eyes: Conjunctivae are normal. Right eye exhibits no discharge. Left eye exhibits no discharge. No scleral icterus.  Neck: Normal range of  motion. Neck supple. No JVD present. No tracheal deviation present. No thyromegaly present.  Cardiovascular: Normal rate, regular rhythm, normal heart sounds and intact distal pulses.  Exam reveals no gallop and no friction rub.   No murmur heard. Pulmonary/Chest: Effort normal and breath sounds normal. No stridor. No respiratory distress. He has no wheezes. He has no rales. He exhibits no tenderness.  Abdominal: Soft. Bowel sounds are normal. He exhibits no distension and no mass. There is no tenderness. There is no rebound and no guarding. Hernia confirmed negative in the right inguinal area and confirmed negative in the left inguinal area.  Genitourinary: Testes normal and penis normal. Right testis shows no mass, no swelling and no tenderness. Right testis is descended. Left testis shows no mass, no swelling and no tenderness. Left testis is descended. Circumcised. No penile tenderness. No discharge found.  Musculoskeletal: Normal range of motion. He exhibits no edema and no tenderness.       Lumbar back: Normal. He exhibits normal range of motion, no tenderness, no bony tenderness, no swelling, no edema, no deformity, no laceration, no pain, no spasm and normal pulse.  Lymphadenopathy:    He has no cervical adenopathy.       Right: No inguinal adenopathy present.       Left: No inguinal adenopathy present.  Neurological: He is alert. He has normal strength. He displays no atrophy, no tremor and normal reflexes. No cranial nerve deficit or sensory deficit. He exhibits normal muscle tone. He displays a negative Romberg sign. He displays no seizure activity. Coordination and gait normal. He displays no Babinski's sign on the right side. He displays no Babinski's sign on the left side.  Reflex Scores:      Tricep reflexes are 1+ on the right side and 1+ on  the left side.      Bicep reflexes are 1+ on the right side and 1+ on the left side.      Brachioradialis reflexes are 1+ on the right side and  1+ on the left side.      Patellar reflexes are 1+ on the right side and 1+ on the left side.      Achilles reflexes are 1+ on the right side and 1+ on the left side. Skin: Skin is warm and dry. No rash noted. He is not diaphoretic. No erythema. No pallor.  Psychiatric: He has a normal mood and affect. His behavior is normal. Judgment and thought content normal.      Lab Results  Component Value Date   WBC 8.8 10/08/2011   HGB 15.3 10/08/2011   HCT 44.5 10/08/2011   PLT 292.0 10/08/2011   GLUCOSE 93 10/08/2011   CHOL 181 10/08/2011   TRIG 109.0 10/08/2011   HDL 43.40 10/08/2011   LDLDIRECT 156.1 07/08/2007   LDLCALC 116* 10/08/2011   ALT 35 10/08/2011   AST 24 10/08/2011   NA 141 10/08/2011   K 4.6 10/08/2011   CL 104 10/08/2011   CREATININE 0.9 10/08/2011   BUN 13 10/08/2011   CO2 30 10/08/2011   TSH 0.18* 10/08/2011     Assessment & Plan:

## 2011-10-09 ENCOUNTER — Encounter: Payer: Self-pay | Admitting: Internal Medicine

## 2011-10-09 ENCOUNTER — Other Ambulatory Visit: Payer: Self-pay | Admitting: Internal Medicine

## 2011-10-09 DIAGNOSIS — E039 Hypothyroidism, unspecified: Secondary | ICD-10-CM

## 2011-10-09 MED ORDER — LEVOTHYROXINE SODIUM 125 MCG PO TABS: 125.0000 ug | ORAL_TABLET | Freq: Every day | ORAL | Status: DC

## 2011-10-09 NOTE — Assessment & Plan Note (Signed)
Exam done, vaccines not given at his request due to $ concerns, labs ordered, pt ed material was given

## 2012-01-15 ENCOUNTER — Encounter: Payer: Self-pay | Admitting: Internal Medicine

## 2012-01-15 ENCOUNTER — Other Ambulatory Visit (INDEPENDENT_AMBULATORY_CARE_PROVIDER_SITE_OTHER): Payer: BC Managed Care – PPO

## 2012-01-15 ENCOUNTER — Ambulatory Visit (INDEPENDENT_AMBULATORY_CARE_PROVIDER_SITE_OTHER): Payer: BC Managed Care – PPO | Admitting: Internal Medicine

## 2012-01-15 VITALS — BP 112/72 | HR 76 | Temp 98.2°F | Resp 16 | Ht 67.0 in | Wt 177.8 lb

## 2012-01-15 DIAGNOSIS — E039 Hypothyroidism, unspecified: Secondary | ICD-10-CM

## 2012-01-15 DIAGNOSIS — E785 Hyperlipidemia, unspecified: Secondary | ICD-10-CM

## 2012-01-15 DIAGNOSIS — M545 Low back pain, unspecified: Secondary | ICD-10-CM

## 2012-01-15 DIAGNOSIS — F410 Panic disorder [episodic paroxysmal anxiety] without agoraphobia: Secondary | ICD-10-CM

## 2012-01-15 DIAGNOSIS — F411 Generalized anxiety disorder: Secondary | ICD-10-CM

## 2012-01-15 LAB — COMPREHENSIVE METABOLIC PANEL
AST: 19 U/L (ref 0–37)
Alkaline Phosphatase: 50 U/L (ref 39–117)
BUN: 16 mg/dL (ref 6–23)
Calcium: 9.4 mg/dL (ref 8.4–10.5)
Creatinine, Ser: 0.9 mg/dL (ref 0.4–1.5)
Glucose, Bld: 87 mg/dL (ref 70–99)

## 2012-01-15 MED ORDER — LEVOTHYROXINE SODIUM 137 MCG PO TABS: 137.0000 ug | ORAL_TABLET | Freq: Every day | ORAL | Status: DC

## 2012-01-15 MED ORDER — OXYCODONE-ACETAMINOPHEN 10-650 MG PO TABS: 1.0000 | ORAL_TABLET | Freq: Four times a day (QID) | ORAL | Status: DC | PRN

## 2012-01-15 MED ORDER — CLONAZEPAM 2 MG PO TABS: ORAL_TABLET | ORAL | Status: DC

## 2012-01-15 MED ORDER — PRAVASTATIN SODIUM 40 MG PO TABS: 40.0000 mg | ORAL_TABLET | Freq: Every day | ORAL | Status: DC

## 2012-01-15 MED ORDER — LEVOTHYROXINE SODIUM 125 MCG PO TABS: 125.0000 ug | ORAL_TABLET | Freq: Every day | ORAL | Status: DC

## 2012-01-15 NOTE — Assessment & Plan Note (Signed)
Continue current meds 

## 2012-01-15 NOTE — Assessment & Plan Note (Signed)
Continue percocet as needed 

## 2012-01-15 NOTE — Assessment & Plan Note (Signed)
He is doing well on pravachol 

## 2012-01-15 NOTE — Patient Instructions (Signed)

## 2012-01-15 NOTE — Assessment & Plan Note (Signed)
His TSH is a little high so I made an increase in his synthroid dose
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
## 2012-01-22 ENCOUNTER — Telehealth: Payer: Self-pay

## 2012-01-22 DIAGNOSIS — F410 Panic disorder [episodic paroxysmal anxiety] without agoraphobia: Secondary | ICD-10-CM

## 2012-01-22 MED ORDER — CLONAZEPAM 2 MG PO TABS: ORAL_TABLET | ORAL | Status: DC

## 2012-01-22 NOTE — Telephone Encounter (Signed)
[facility] Triage Call Report Triage Record Num: [id number] Operator: Remonia Richter Patient Name: Jonathan Luna Call Date & Time: 01/21/2012 5:19:28PM Patient Phone: [phone] PCP: Sanda Linger Patient Gender: Male PCP Fax : Patient DOB: [birth year] Practice Name: [facility] Reason for Call: Caller: Maralyn Sago; PCP: Sanda Linger; CB#: [phone]; Call regarding ; [name] was written for 60 and he has been getting 90, she also has a question on his Percocet script and asking for clarification on the wording of the Percocet script,this was gone over with pharmacist per Epic record with voiced understanding, they will contact the office in am about the amount on the Klonapin Protocol(s) Used: Medication Questions - Adult Recommended Outcome per Protocol: Provided Health Information Reason for Outcome: Caller has medication question(s) that was answered with available resources Care Advice: ~ 01/21/2012 5:32:12PM Page 1 of 1 [email or web address]

## 2012-01-22 NOTE — Telephone Encounter (Signed)
Jonathan Luna w/ [facility] called regarding clonazepam  TID #60. Need to know if ok for 90tabs, per MD ok to dispense 90 tabs

## 2012-04-12 ENCOUNTER — Other Ambulatory Visit (INDEPENDENT_AMBULATORY_CARE_PROVIDER_SITE_OTHER): Payer: BC Managed Care – PPO

## 2012-04-12 ENCOUNTER — Ambulatory Visit (INDEPENDENT_AMBULATORY_CARE_PROVIDER_SITE_OTHER): Payer: BC Managed Care – PPO | Admitting: Internal Medicine

## 2012-04-12 ENCOUNTER — Encounter: Payer: Self-pay | Admitting: Internal Medicine

## 2012-04-12 VITALS — BP 122/72 | HR 76 | Temp 98.1°F | Resp 16 | Wt 177.0 lb

## 2012-04-12 DIAGNOSIS — F329 Major depressive disorder, single episode, unspecified: Secondary | ICD-10-CM

## 2012-04-12 DIAGNOSIS — R5383 Other fatigue: Secondary | ICD-10-CM

## 2012-04-12 DIAGNOSIS — E039 Hypothyroidism, unspecified: Secondary | ICD-10-CM

## 2012-04-12 DIAGNOSIS — E785 Hyperlipidemia, unspecified: Secondary | ICD-10-CM

## 2012-04-12 DIAGNOSIS — M545 Low back pain, unspecified: Secondary | ICD-10-CM

## 2012-04-12 DIAGNOSIS — F410 Panic disorder [episodic paroxysmal anxiety] without agoraphobia: Secondary | ICD-10-CM

## 2012-04-12 DIAGNOSIS — F3289 Other specified depressive episodes: Secondary | ICD-10-CM

## 2012-04-12 DIAGNOSIS — R5381 Other malaise: Secondary | ICD-10-CM

## 2012-04-12 DIAGNOSIS — F411 Generalized anxiety disorder: Secondary | ICD-10-CM

## 2012-04-12 LAB — CBC WITH DIFFERENTIAL/PLATELET
Basophils Absolute: 0 10*3/uL (ref 0.0–0.1)
Eosinophils Absolute: 0.1 10*3/uL (ref 0.0–0.7)
Hemoglobin: 14.7 g/dL (ref 13.0–17.0)
Lymphocytes Relative: 17.1 % (ref 12.0–46.0)
MCHC: 34 g/dL (ref 30.0–36.0)
Neutro Abs: 8.2 10*3/uL — ABNORMAL HIGH (ref 1.4–7.7)
Neutrophils Relative %: 76.2 % (ref 43.0–77.0)
RDW: 13.4 % (ref 11.5–14.6)

## 2012-04-12 LAB — COMPREHENSIVE METABOLIC PANEL
ALT: 16 U/L (ref 0–53)
AST: 19 U/L (ref 0–37)
Albumin: 4.2 g/dL (ref 3.5–5.2)
Calcium: 9.4 mg/dL (ref 8.4–10.5)
Chloride: 103 mEq/L (ref 96–112)
Potassium: 5.1 mEq/L (ref 3.5–5.1)
Sodium: 139 mEq/L (ref 135–145)

## 2012-04-12 LAB — LIPID PANEL
Total CHOL/HDL Ratio: 3
Triglycerides: 78 mg/dL (ref 0.0–149.0)

## 2012-04-12 MED ORDER — LEVOTHYROXINE SODIUM 137 MCG PO TABS: 137.0000 ug | ORAL_TABLET | Freq: Every day | ORAL | Status: DC

## 2012-04-12 MED ORDER — CLONAZEPAM 2 MG PO TABS: ORAL_TABLET | ORAL | Status: DC

## 2012-04-12 MED ORDER — OXYCODONE-ACETAMINOPHEN 10-650 MG PO TABS: ORAL_TABLET | ORAL | Status: DC

## 2012-04-12 MED ORDER — VILAZODONE HCL 10 & 20 & 40 MG PO KIT: 1.0000 | PACK | Freq: Every day | ORAL | Status: DC

## 2012-04-12 MED ORDER — OXYCODONE-ACETAMINOPHEN 10-650 MG PO TABS: 1.0000 | ORAL_TABLET | Freq: Four times a day (QID) | ORAL | Status: DC | PRN

## 2012-04-12 MED ORDER — PRAVASTATIN SODIUM 40 MG PO TABS: 40.0000 mg | ORAL_TABLET | Freq: Every day | ORAL | Status: DC

## 2012-04-12 NOTE — Progress Notes (Signed)
Subjective:    Patient ID: Jonathan Luna, male    DOB: 01/25/1966, 46 y.o.   MRN: [id number]  Thyroid Problem Presents for follow-up visit. Symptoms include anxiety, depressed mood and fatigue. Patient reports no cold intolerance, constipation, diaphoresis, diarrhea, dry skin, hair loss, heat intolerance, hoarse voice, leg swelling, nail problem, palpitations, tremors, visual change, weight gain or weight loss. The symptoms have been worsening.      Review of Systems  Constitutional: Positive for fatigue. Negative for fever, chills, weight loss, weight gain, diaphoresis, activity change, appetite change and unexpected weight change.  HENT: Negative.  Negative for hoarse voice.   Eyes: Negative.   Respiratory: Negative for cough, chest tightness, shortness of breath, wheezing and stridor.   Cardiovascular: Negative for chest pain, palpitations and leg swelling.  Gastrointestinal: Negative for nausea, vomiting, abdominal pain, diarrhea, constipation, blood in stool and anal bleeding.  Genitourinary: Negative for dysuria, urgency, frequency, hematuria, flank pain, decreased urine volume, enuresis and difficulty urinating.  Musculoskeletal: Positive for back pain (chronic, unchanged). Negative for myalgias, joint swelling, arthralgias and gait problem.  Skin: Negative for color change, pallor, rash and wound.  Neurological: Negative for tremors.  Hematological: Negative for cold intolerance, heat intolerance and adenopathy. Does not bruise/bleed easily.  Psychiatric/Behavioral: Positive for disturbed wake/sleep cycle (DFA and FA's) and dysphoric mood (sadness, anhedonia, ruminations). Negative for suicidal ideas, hallucinations, behavioral problems, confusion, self-injury, decreased concentration and agitation. The patient is nervous/anxious. The patient is not hyperactive.        Objective:   Physical Exam  Vitals reviewed. Constitutional: He is oriented to person, place, and time. He appears  well-developed and well-nourished. No distress.  HENT:  Head: Normocephalic and atraumatic.  Mouth/Throat: Oropharynx is clear and moist. No oropharyngeal exudate.  Eyes: Conjunctivae are normal. Right eye exhibits no discharge. Left eye exhibits no discharge. No scleral icterus.  Neck: Normal range of motion. Neck supple. No JVD present. No tracheal deviation present. No thyromegaly present.  Cardiovascular: Normal rate, regular rhythm, normal heart sounds and intact distal pulses.  Exam reveals no gallop and no friction rub.   No murmur heard. Pulmonary/Chest: Effort normal and breath sounds normal. No stridor. No respiratory distress. He has no wheezes. He has no rales. He exhibits no tenderness.  Abdominal: Soft. Bowel sounds are normal. He exhibits no distension and no mass. There is no tenderness. There is no rebound and no guarding.  Musculoskeletal: Normal range of motion. He exhibits no edema and no tenderness.  Lymphadenopathy:    He has no cervical adenopathy.  Neurological: He is alert and oriented to person, place, and time. He has normal strength. He is not disoriented. He displays no atrophy, no tremor and normal reflexes. No cranial nerve deficit or sensory deficit. He exhibits normal muscle tone. He displays a negative Romberg sign. He displays no seizure activity. Coordination and gait normal. He displays no Babinski's sign on the right side. He displays no Babinski's sign on the left side.  Reflex Scores:      Tricep reflexes are 1+ on the right side and 1+ on the left side.      Bicep reflexes are 1+ on the right side and 1+ on the left side.      Brachioradialis reflexes are 1+ on the right side and 1+ on the left side.      Patellar reflexes are 1+ on the right side and 1+ on the left side.      Achilles reflexes are 1+ on  the right side and 1+ on the left side. Skin: Skin is warm and dry. No rash noted. He is not diaphoretic. No erythema. No pallor.  Psychiatric: His  speech is normal and behavior is normal. Judgment and thought content normal. His mood appears anxious. His affect is not angry, not blunt, not labile and not inappropriate. Cognition and memory are normal. He exhibits a depressed mood.     Lab Results  Component Value Date   WBC 8.8 10/08/2011   HGB 15.3 10/08/2011   HCT 44.5 10/08/2011   PLT 292.0 10/08/2011   GLUCOSE 87 01/15/2012   CHOL 181 10/08/2011   TRIG 109.0 10/08/2011   HDL 43.40 10/08/2011   LDLDIRECT 156.1 07/08/2007   LDLCALC 116* 10/08/2011   ALT 19 01/15/2012   AST 19 01/15/2012   NA 139 01/15/2012   K 4.6 01/15/2012   CL 103 01/15/2012   CREATININE 0.9 01/15/2012   BUN 16 01/15/2012   CO2 29 01/15/2012   TSH 6.79* 01/15/2012       Assessment & Plan:

## 2012-04-12 NOTE — Patient Instructions (Signed)

## 2012-04-12 NOTE — Assessment & Plan Note (Signed)
I will check his labs today to look for secondary causes 

## 2012-04-12 NOTE — Assessment & Plan Note (Signed)
No changes noted today, he will continue taking Oxycodone as needed

## 2012-04-12 NOTE — Assessment & Plan Note (Signed)
I will check his TSH today and will adjust the dose if needed 

## 2012-04-12 NOTE — Assessment & Plan Note (Signed)
Start viibryd, he will continue klonopin as needed

## 2012-04-12 NOTE — Assessment & Plan Note (Signed)
He will try viibryd and if that does not help I will refer to psych

## 2012-04-12 NOTE — Assessment & Plan Note (Signed)
He is doing well on pravastatin, I will check his FLP today 

## 2012-04-13 ENCOUNTER — Encounter: Payer: Self-pay | Admitting: Internal Medicine

## 2012-04-13 LAB — TESTOSTERONE, FREE, TOTAL, SHBG
Sex Hormone Binding: 27 nmol/L (ref 13–71)
Testosterone, Free: 121.6 pg/mL (ref 47.0–244.0)
Testosterone-% Free: 2.4 % (ref 1.6–2.9)
Testosterone: 516.2 ng/dL (ref 300–890)

## 2012-05-11 ENCOUNTER — Telehealth: Payer: Self-pay

## 2012-05-11 ENCOUNTER — Other Ambulatory Visit: Payer: Self-pay

## 2012-05-11 DIAGNOSIS — F411 Generalized anxiety disorder: Secondary | ICD-10-CM

## 2012-05-11 DIAGNOSIS — F329 Major depressive disorder, single episode, unspecified: Secondary | ICD-10-CM

## 2012-05-11 DIAGNOSIS — F410 Panic disorder [episodic paroxysmal anxiety] without agoraphobia: Secondary | ICD-10-CM

## 2012-05-11 MED ORDER — VILAZODONE HCL 40 MG PO TABS: 40.0000 mg | ORAL_TABLET | Freq: Every day | ORAL | Status: DC

## 2012-05-11 NOTE — Telephone Encounter (Signed)
Pt called requesting Rx to pharmacy for Viibryd. Pt is requesting a 90 day supply, please verify dosage.

## 2012-05-11 NOTE — Telephone Encounter (Signed)
done

## 2012-05-12 ENCOUNTER — Telehealth: Payer: Self-pay

## 2012-05-12 DIAGNOSIS — F329 Major depressive disorder, single episode, unspecified: Secondary | ICD-10-CM

## 2012-05-12 MED ORDER — SERTRALINE HCL 50 MG PO TABS: 50.0000 mg | ORAL_TABLET | Freq: Every day | ORAL | Status: DC

## 2012-05-12 NOTE — Telephone Encounter (Signed)
Pt advised about referral and is requesting advisement about medication until appt date.

## 2012-05-12 NOTE — Telephone Encounter (Signed)
He has tried so many in the past that I do not know what to try so I have referred him to psychiatry

## 2012-05-12 NOTE — Telephone Encounter (Signed)
Try zoloft 

## 2012-05-12 NOTE — Telephone Encounter (Signed)
Pt called stating that Viibryd is too expensive $150/month. Pt is requesting alternative medication.

## 2012-05-12 NOTE — Telephone Encounter (Signed)
Patient notified

## 2012-07-28 ENCOUNTER — Other Ambulatory Visit (INDEPENDENT_AMBULATORY_CARE_PROVIDER_SITE_OTHER): Payer: BC Managed Care – PPO

## 2012-07-28 ENCOUNTER — Ambulatory Visit (INDEPENDENT_AMBULATORY_CARE_PROVIDER_SITE_OTHER): Payer: BC Managed Care – PPO | Admitting: Internal Medicine

## 2012-07-28 ENCOUNTER — Encounter: Payer: Self-pay | Admitting: Internal Medicine

## 2012-07-28 VITALS — BP 126/84 | HR 84 | Temp 98.5°F | Resp 16 | Ht 67.0 in | Wt 187.8 lb

## 2012-07-28 DIAGNOSIS — N529 Male erectile dysfunction, unspecified: Secondary | ICD-10-CM | POA: Insufficient documentation

## 2012-07-28 DIAGNOSIS — M545 Low back pain, unspecified: Secondary | ICD-10-CM

## 2012-07-28 DIAGNOSIS — R7309 Other abnormal glucose: Secondary | ICD-10-CM

## 2012-07-28 DIAGNOSIS — E785 Hyperlipidemia, unspecified: Secondary | ICD-10-CM

## 2012-07-28 DIAGNOSIS — F410 Panic disorder [episodic paroxysmal anxiety] without agoraphobia: Secondary | ICD-10-CM

## 2012-07-28 DIAGNOSIS — F411 Generalized anxiety disorder: Secondary | ICD-10-CM

## 2012-07-28 DIAGNOSIS — E039 Hypothyroidism, unspecified: Secondary | ICD-10-CM

## 2012-07-28 DIAGNOSIS — F329 Major depressive disorder, single episode, unspecified: Secondary | ICD-10-CM

## 2012-07-28 LAB — CBC WITH DIFFERENTIAL/PLATELET
Basophils Relative: 0.2 % (ref 0.0–3.0)
Eosinophils Relative: 1.2 % (ref 0.0–5.0)
Hemoglobin: 13.3 g/dL (ref 13.0–17.0)
Lymphocytes Relative: 25.4 % (ref 12.0–46.0)
MCV: 94.3 fl (ref 78.0–100.0)
Neutro Abs: 4.9 10*3/uL (ref 1.4–7.7)
Neutrophils Relative %: 64.1 % (ref 43.0–77.0)
RBC: 4.24 Mil/uL (ref 4.22–5.81)
WBC: 7.7 10*3/uL (ref 4.5–10.5)

## 2012-07-28 LAB — COMPREHENSIVE METABOLIC PANEL
Albumin: 3.8 g/dL (ref 3.5–5.2)
BUN: 17 mg/dL (ref 6–23)
CO2: 27 mEq/L (ref 19–32)
Calcium: 9.1 mg/dL (ref 8.4–10.5)
Chloride: 107 mEq/L (ref 96–112)
GFR: 101.64 mL/min (ref >60.00)
Glucose, Bld: 89 mg/dL (ref 70–99)
Potassium: 4.9 mEq/L (ref 3.5–5.1)

## 2012-07-28 LAB — URINALYSIS, ROUTINE W REFLEX MICROSCOPIC
Hgb urine dipstick: NEGATIVE
Leukocytes, UA: NEGATIVE
Specific Gravity, Urine: 1.02 (ref 1.000–1.030)
Urine Glucose: NEGATIVE
Urobilinogen, UA: 0.2 (ref 0.0–1.0)
WBC, UA: NONE SEEN (ref 0–?)

## 2012-07-28 LAB — TSH: TSH: 5.52 u[IU]/mL — ABNORMAL HIGH (ref 0.35–5.50)

## 2012-07-28 LAB — HEMOGLOBIN A1C: Hgb A1c MFr Bld: 5.2 % (ref 4.6–6.5)

## 2012-07-28 MED ORDER — TRAZODONE HCL 100 MG PO TABS: 100.0000 mg | ORAL_TABLET | Freq: Every day | ORAL | Status: DC

## 2012-07-28 MED ORDER — OXYCODONE-ACETAMINOPHEN 10-650 MG PO TABS: ORAL_TABLET | ORAL | Status: DC

## 2012-07-28 MED ORDER — LEVOTHYROXINE SODIUM 150 MCG PO TABS: 150.0000 ug | ORAL_TABLET | Freq: Every day | ORAL | Status: DC

## 2012-07-28 MED ORDER — TADALAFIL 20 MG PO TABS: 20.0000 mg | ORAL_TABLET | Freq: Every day | ORAL | Status: DC | PRN

## 2012-07-28 MED ORDER — CLONAZEPAM 2 MG PO TABS: ORAL_TABLET | ORAL | Status: DC

## 2012-07-28 MED ORDER — LEVOTHYROXINE SODIUM 137 MCG PO TABS: 137.0000 ug | ORAL_TABLET | Freq: Every day | ORAL | Status: DC

## 2012-07-28 NOTE — Progress Notes (Signed)
Subjective:    Patient ID: Jonathan Luna, male    DOB: 10/02/1966, 46 y.o.   MRN: [id number]  Erectile Dysfunction This is a new problem. The current episode started more than 1 year ago. The problem is unchanged. The nature of his difficulty is maintaining erection and penetration. Non-physiologic factors contributing to erectile dysfunction are anxiety and performance anxiety. He reports no decreased libido. He reports his erection duration to be 1 to 5 minutes. Irritative symptoms do not include frequency, nocturia or urgency. Obstructive symptoms do not include dribbling, incomplete emptying, an intermittent stream, a slower stream, straining or a weak stream. Pertinent negatives include no chills, dysuria, genital pain, hematuria, hesitancy or inability to urinate. The symptoms are aggravated by medications. Past treatments include tadalafil. The treatment provided mild relief. He has been using treatment for 1 to 2 years. He has had no adverse reactions caused by medications.      Review of Systems  Constitutional: Negative.  Negative for chills.  HENT: Negative.   Eyes: Negative.   Respiratory: Negative for cough, choking, chest tightness, shortness of breath, wheezing and stridor.   Cardiovascular: Negative for chest pain, palpitations and leg swelling.  Gastrointestinal: Negative for nausea, abdominal pain, diarrhea, constipation, anal bleeding and rectal pain.  Genitourinary: Negative.  Negative for dysuria, hesitancy, urgency, frequency, hematuria, flank pain, decreased urine volume, discharge, penile swelling, scrotal swelling, enuresis, difficulty urinating, genital sores, penile pain, testicular pain, decreased libido, incomplete emptying and nocturia.  Musculoskeletal: Positive for back pain (chronic, unchanged). Negative for myalgias, joint swelling, arthralgias and gait problem.  Skin: Negative for color change, pallor, rash and wound.  Neurological: Negative.   Hematological:  Negative for adenopathy. Does not bruise/bleed easily.  Psychiatric/Behavioral: Positive for disturbed wake/sleep cycle and dysphoric mood. Negative for suicidal ideas, hallucinations, behavioral problems, confusion, self-injury, decreased concentration and agitation. The patient is nervous/anxious. The patient is not hyperactive.        Objective:   Physical Exam  Vitals reviewed. Constitutional: He is oriented to person, place, and time. He appears well-developed and well-nourished. No distress.  HENT:  Head: Normocephalic and atraumatic.  Mouth/Throat: Oropharynx is clear and moist. No oropharyngeal exudate.  Eyes: Conjunctivae normal are normal. Right eye exhibits no discharge. Left eye exhibits no discharge. No scleral icterus.  Neck: Normal range of motion. Neck supple. No JVD present. No tracheal deviation present. No thyromegaly present.  Cardiovascular: Normal rate, regular rhythm, normal heart sounds and intact distal pulses.  Exam reveals no gallop and no friction rub.   No murmur heard. Pulmonary/Chest: Effort normal and breath sounds normal. No stridor. No respiratory distress. He has no wheezes. He has no rales. He exhibits no tenderness.  Abdominal: Soft. Bowel sounds are normal. He exhibits no distension and no mass. There is no tenderness. There is no rebound and no guarding. Hernia confirmed negative in the right inguinal area and confirmed negative in the left inguinal area.  Genitourinary: Rectum normal, prostate normal, testes normal and penis normal. Rectal exam shows no external hemorrhoid, no internal hemorrhoid, no fissure, no mass, no tenderness and anal tone normal. Guaiac negative stool. Prostate is not enlarged and not tender. Right testis shows no mass, no swelling and no tenderness. Right testis is descended. Left testis shows no mass, no swelling and no tenderness. Left testis is descended. Circumcised. No penile erythema or penile tenderness. No discharge found.    Musculoskeletal: Normal range of motion. He exhibits no edema and no tenderness.  Lymphadenopathy:  He has no cervical adenopathy.       Right: No inguinal adenopathy present.       Left: No inguinal adenopathy present.  Neurological: He is oriented to person, place, and time.  Skin: Skin is warm and dry. No rash noted. He is not diaphoretic. No erythema. No pallor.  Psychiatric: He has a normal mood and affect. His behavior is normal. Judgment and thought content normal.      Lab Results  Component Value Date   WBC 10.7* 04/12/2012   HGB 14.7 04/12/2012   HCT 43.4 04/12/2012   PLT 255.0 04/12/2012   GLUCOSE 107* 04/12/2012   CHOL 151 04/12/2012   TRIG 78.0 04/12/2012   HDL 57.80 04/12/2012   LDLDIRECT 156.1 07/08/2007   LDLCALC 78 04/12/2012   ALT 16 04/12/2012   AST 19 04/12/2012   NA 139 04/12/2012   K 5.1 04/12/2012   CL 103 04/12/2012   CREATININE 1.0 04/12/2012   BUN 12 04/12/2012   CO2 27 04/12/2012   TSH 1.24 04/12/2012      Assessment & Plan:

## 2012-07-28 NOTE — Assessment & Plan Note (Addendum)
Continue klonopin as needed, start trazodone

## 2012-07-28 NOTE — Assessment & Plan Note (Signed)
I will check his a1c to see if he has developed DM II 

## 2012-07-28 NOTE — Addendum Note (Signed)
Addended by: [name] on: 07/28/2012 12:57 PM   Modules accepted: Orders

## 2012-07-28 NOTE — Assessment & Plan Note (Signed)
I will check his TSH and will adjust the dose if needed 

## 2012-07-28 NOTE — Patient Instructions (Signed)

## 2012-07-28 NOTE — Assessment & Plan Note (Signed)
He is doing well on pravachol 

## 2012-07-28 NOTE — Assessment & Plan Note (Addendum)
Continue klonopin as needed, start trazodone 

## 2012-07-28 NOTE — Assessment & Plan Note (Signed)
He will try cialis, I will check his labs today to look for secondary causes

## 2012-07-28 NOTE — Assessment & Plan Note (Addendum)
He will not take an SSRI, will try trazodone

## 2012-07-28 NOTE — Assessment & Plan Note (Signed)
He is getting relief from his pain with percocet, there are no warning signs wrt his low back pain

## 2012-07-29 ENCOUNTER — Encounter: Payer: Self-pay | Admitting: Internal Medicine

## 2012-07-29 LAB — TESTOSTERONE, FREE, TOTAL, SHBG
Testosterone, Free: 105.5 pg/mL (ref 47.0–244.0)
Testosterone-% Free: 2.4 % (ref 1.6–2.9)

## 2012-09-29 ENCOUNTER — Encounter: Payer: Self-pay | Admitting: Internal Medicine

## 2012-09-29 ENCOUNTER — Ambulatory Visit (INDEPENDENT_AMBULATORY_CARE_PROVIDER_SITE_OTHER): Payer: BC Managed Care – PPO | Admitting: Internal Medicine

## 2012-09-29 ENCOUNTER — Other Ambulatory Visit (INDEPENDENT_AMBULATORY_CARE_PROVIDER_SITE_OTHER): Payer: BC Managed Care – PPO

## 2012-09-29 VITALS — BP 114/76 | HR 73 | Temp 98.1°F | Resp 16 | Wt 192.0 lb

## 2012-09-29 DIAGNOSIS — G894 Chronic pain syndrome: Secondary | ICD-10-CM

## 2012-09-29 DIAGNOSIS — F410 Panic disorder [episodic paroxysmal anxiety] without agoraphobia: Secondary | ICD-10-CM

## 2012-09-29 DIAGNOSIS — E039 Hypothyroidism, unspecified: Secondary | ICD-10-CM

## 2012-09-29 DIAGNOSIS — M545 Low back pain, unspecified: Secondary | ICD-10-CM

## 2012-09-29 DIAGNOSIS — F411 Generalized anxiety disorder: Secondary | ICD-10-CM

## 2012-09-29 MED ORDER — CLONAZEPAM 2 MG PO TABS: ORAL_TABLET | ORAL | Status: DC

## 2012-09-29 MED ORDER — OXYCODONE-ACETAMINOPHEN 10-650 MG PO TABS: ORAL_TABLET | ORAL | Status: DC

## 2012-09-29 NOTE — Assessment & Plan Note (Signed)
Continue percocet as needed I will check his UDS today

## 2012-09-29 NOTE — Assessment & Plan Note (Signed)
Continue klonopin as needed I will check his UDS today to see that he is compliant with the listed meds

## 2012-09-29 NOTE — Patient Instructions (Addendum)

## 2012-09-29 NOTE — Assessment & Plan Note (Signed)
He will continue percocet as needed I will check his UDS today to see that he is compliant with the stated meds

## 2012-09-29 NOTE — Assessment & Plan Note (Signed)
I will recheck his TSH today and will adjust his dose if needed 

## 2012-09-29 NOTE — Progress Notes (Signed)
Subjective:    Patient ID: Jonathan Luna, male    DOB: 1966-04-29, 46 y.o.   MRN: [id number]  Thyroid Problem Presents for follow-up visit. Symptoms include anxiety, fatigue and weight gain. Patient reports no cold intolerance, constipation, depressed mood, diaphoresis, diarrhea, dry skin, hair loss, heat intolerance, hoarse voice, leg swelling, menstrual problem, nail problem, palpitations, tremors, visual change or weight loss. The symptoms have been stable.      Review of Systems  Constitutional: Positive for weight gain and fatigue. Negative for fever, chills, weight loss, diaphoresis, activity change, appetite change and unexpected weight change.  HENT: Negative.  Negative for hoarse voice.   Eyes: Negative.   Respiratory: Negative for cough, chest tightness, shortness of breath, wheezing and stridor.   Cardiovascular: Negative for chest pain, palpitations and leg swelling.  Gastrointestinal: Negative for nausea, vomiting, abdominal pain, diarrhea, constipation and abdominal distention.  Genitourinary: Negative.  Negative for menstrual problem.  Musculoskeletal: Positive for back pain (chronic,unchanged). Negative for myalgias, joint swelling and gait problem.  Skin: Negative for color change, pallor, rash and wound.  Neurological: Negative.  Negative for tremors.  Hematological: Negative for cold intolerance, heat intolerance and adenopathy. Does not bruise/bleed easily.  Psychiatric/Behavioral: Positive for sleep disturbance. Negative for suicidal ideas, hallucinations, behavioral problems, confusion, self-injury, dysphoric mood, decreased concentration and agitation. The patient is nervous/anxious. The patient is not hyperactive.        Objective:   Physical Exam  Vitals reviewed. Constitutional: He is oriented to person, place, and time. He appears well-developed and well-nourished. No distress.  HENT:  Head: Normocephalic and atraumatic.  Mouth/Throat: Oropharynx is clear and  moist. No oropharyngeal exudate.  Eyes: Conjunctivae normal are normal. Right eye exhibits no discharge. Left eye exhibits no discharge. No scleral icterus.  Neck: Normal range of motion. Neck supple. No JVD present. No tracheal deviation present. No thyromegaly present.  Cardiovascular: Normal rate, regular rhythm and intact distal pulses.  Exam reveals no gallop and no friction rub.   No murmur heard. Pulmonary/Chest: Effort normal and breath sounds normal. No stridor. No respiratory distress. He has no wheezes. He has no rales. He exhibits no tenderness.  Abdominal: Soft. Bowel sounds are normal. He exhibits no distension and no mass. There is no tenderness. There is no rebound and no guarding.  Musculoskeletal: Normal range of motion. He exhibits no edema and no tenderness.  Lymphadenopathy:    He has no cervical adenopathy.  Neurological: He is oriented to person, place, and time.  Skin: Skin is warm and dry. No rash noted. He is not diaphoretic. No erythema. No pallor.  Psychiatric: He has a normal mood and affect. His behavior is normal. Judgment and thought content normal. His mood appears not anxious. His affect is not angry, not blunt, not labile and not inappropriate. His speech is not rapid and/or pressured, not delayed, not tangential and not slurred. He is not agitated, not aggressive, is not hyperactive, not slowed, not withdrawn, not actively hallucinating and not combative. Thought content is not paranoid and not delusional. Cognition and memory are normal. Cognition and memory are not impaired. He does not express impulsivity or inappropriate judgment. He does not exhibit a depressed mood. He expresses no homicidal and no suicidal ideation. He expresses no suicidal plans and no homicidal plans. He is communicative. He exhibits normal recent memory and normal remote memory. He is attentive.      Lab Results  Component Value Date   WBC 7.7 07/28/2012   HGB 13.3  07/28/2012   HCT  40.0 07/28/2012   PLT 275.0 07/28/2012   GLUCOSE 89 07/28/2012   CHOL 151 04/12/2012   TRIG 78.0 04/12/2012   HDL 57.80 04/12/2012   LDLDIRECT 156.1 07/08/2007   LDLCALC 78 04/12/2012   ALT 19 07/28/2012   AST 19 07/28/2012   NA 139 07/28/2012   K 4.9 07/28/2012   CL 107 07/28/2012   CREATININE 0.9 07/28/2012   BUN 17 07/28/2012   CO2 27 07/28/2012   TSH 5.52* 07/28/2012   PSA 0.85 07/28/2012   HGBA1C 5.2 07/28/2012      Assessment & Plan:

## 2012-09-30 LAB — DRUGS OF ABUSE SCREEN W/O ALC, ROUTINE URINE
Barbiturate Quant, Ur: NEGATIVE
Marijuana Metabolite: NEGATIVE
Methadone: NEGATIVE
Opiate Screen, Urine: NEGATIVE
Propoxyphene: NEGATIVE

## 2012-10-01 LAB — BENZODIAZEPINES (GC/LC/MS), URINE
Alprazolam (GC/LC/MS), ur confirm: 97 ng/mL
Alprazolam metabolite (GC/LC/MS), ur confirm: 347 ng/mL
Clonazepam metabolite (GC/LC/MS), ur confirm: 143 ng/mL
Estazolam (GC/LC/MS), ur confirm: NEGATIVE ng/mL
Flunitrazepam metabolite (GC/LC/MS), ur confirm: NEGATIVE ng/mL
Lorazepam (GC/LC/MS), ur confirm: NEGATIVE ng/mL

## 2012-10-01 LAB — OPIATES/OPIOIDS (LC/MS-MS)
Heroin (6-AM), UR: NEGATIVE ng/mL
Morphine Urine: NEGATIVE ng/mL
Oxycodone, ur: 889 ng/mL
Oxymorphone: 298 ng/mL

## 2012-11-16 ENCOUNTER — Ambulatory Visit: Payer: BC Managed Care – PPO

## 2012-11-18 ENCOUNTER — Ambulatory Visit (INDEPENDENT_AMBULATORY_CARE_PROVIDER_SITE_OTHER): Payer: BC Managed Care – PPO | Admitting: Emergency Medicine

## 2012-11-18 VITALS — BP 145/99 | HR 72 | Temp 97.5°F | Resp 16 | Ht 68.38 in | Wt 189.6 lb

## 2012-11-18 DIAGNOSIS — J018 Other acute sinusitis: Secondary | ICD-10-CM

## 2012-11-18 DIAGNOSIS — J209 Acute bronchitis, unspecified: Secondary | ICD-10-CM

## 2012-11-18 MED ORDER — PSEUDOEPHEDRINE-GUAIFENESIN ER 60-600 MG PO TB12: 1.0000 | ORAL_TABLET | Freq: Two times a day (BID) | ORAL | Status: DC

## 2012-11-18 MED ORDER — HYDROCOD POLST-CHLORPHEN POLST 10-8 MG/5ML PO LQCR: 5.0000 mL | Freq: Two times a day (BID) | ORAL | Status: DC | PRN

## 2012-11-18 MED ORDER — AMOXICILLIN-POT CLAVULANATE 875-125 MG PO TABS: 1.0000 | ORAL_TABLET | Freq: Two times a day (BID) | ORAL | Status: DC

## 2012-11-18 NOTE — Patient Instructions (Addendum)

## 2012-11-18 NOTE — Progress Notes (Signed)
[facility] [address], [place] [address] [phone]  Date:  11/18/2012   Name:  Jonathan Luna   DOB:  [birth year]   MRN:  [id number]  PCP:  Sanda Linger, MD    Chief Complaint: Sinusitis   History of Present Illness:  Jonathan Luna is a 47 y.o. very pleasant male patient who presents with the following:  Ill with nasal congestion and drainage on Sunday.  Worse on Monday with malaise, fatigue and chills. Has a dark purulent nasal drainage. Felt hot but no documented temperature elevation.   Sore throat.  Now has a cough started today.  Worse at night.  Scant sputum production.  No wheezing or shortness of breath.  Patient Active Problem List  Diagnosis  . HYPOTHYROIDISM  . HYPERLIPIDEMIA  . ANXIETY  . PANIC DISORDER  . DEPRESSION  . BACK PAIN, LUMBAR, CHRONIC  . Irritable bowel syndrome  . Routine general medical examination at a health care facility  . ED (erectile dysfunction)  . Chronic pain disorder    Past Medical History  Diagnosis Date  . Anxiety   . Panic attacks   . Low back pain   . History of bilateral cataract extraction   . Hypothyroidism   . Depression   . Hyperlipidemia   . Panic attack   . Heart murmur   . Allergy   . Cataract     Past Surgical History  Procedure Date  . Cataract extraction   . Eye surgery     History  Substance Use Topics  . Smoking status: Never Smoker   . Smokeless tobacco: Not on file  . Alcohol Use: 1.2 oz/week    2 Cans of beer per week    Family History  Problem Relation Age of Onset  . Hypertension Father   . Breast cancer Mother     Allergies  Allergen Reactions  . Fluoxetine Hcl   . Venlafaxine     Medication list has been reviewed and updated.  Current Outpatient Prescriptions on File Prior to Visit  Medication Sig Dispense Refill  . aspirin 81 MG tablet Take 81 mg by mouth daily.      . clonazePAM (KLONOPIN) 2 MG tablet 2 (two) times daily as needed. Take 1 tablet tid as  needed for anxiety      . Multiple Vitamin (MULTIVITAMIN) tablet Take 1 tablet by mouth daily.        Marland Kitchen oxyCODONE-acetaminophen (PERCOCET) 10-650 MG per tablet Take 1 tab every 4-6hrs as needed, not to exceed 4 per day  100 tablet  0  . pravastatin (PRAVACHOL) 40 MG tablet Take 1 tablet (40 mg total) by mouth daily.  90 tablet  3  . tadalafil (CIALIS) 20 MG tablet Take 1 tablet (20 mg total) by mouth daily as needed for erectile dysfunction.  6 tablet  11  . traZODone (DESYREL) 100 MG tablet Take 1 tablet (100 mg total) by mouth at bedtime.  90 tablet  3    Review of Systems:  As per HPI, otherwise negative.   Physical Examination: Filed Vitals:   11/18/12 1636  BP: 145/99  Pulse: 72  Temp: 97.5 F (36.4 C)  Resp: 16   Filed Vitals:   11/18/12 1636  Height: 5' 8.38" (1.737 m)  Weight: 189 lb 9.6 oz (86.002 kg)   Body mass index is 28.51 kg/(m^2). Ideal Body Weight: Weight in (lb) to have BMI = 25: 165.9   GEN: WDWN,  NAD, Non-toxic, A & O x 3.  No rash HEENT: Atraumatic, Normocephalic. Neck supple. No masses, No LAD. Ears and Nose: No external deformity.  TM negative.  Green nasal drainage CV: RRR, No M/G/R. No JVD. No thrill. No extra heart sounds. PULM: CTA B, no wheezes, crackles, rhonchi. No retractions. No resp. distress. No accessory muscle use. ABD: S, NT, ND, +BS. No rebound. No HSM. EXTR: No c/c/e NEURO Normal gait.  PSYCH: Normally interactive. Conversant. Not depressed or anxious appearing.  Calm demeanor.    Assessment and Plan: Sinusitis Bronchitis mucinex tussionex augmentin Follow up as needed  Carmelina Dane, MD

## 2012-12-13 ENCOUNTER — Ambulatory Visit (INDEPENDENT_AMBULATORY_CARE_PROVIDER_SITE_OTHER): Payer: Self-pay | Admitting: Internal Medicine

## 2012-12-13 ENCOUNTER — Encounter: Payer: Self-pay | Admitting: Internal Medicine

## 2012-12-13 ENCOUNTER — Other Ambulatory Visit (INDEPENDENT_AMBULATORY_CARE_PROVIDER_SITE_OTHER): Payer: BC Managed Care – PPO

## 2012-12-13 VITALS — BP 120/82 | HR 102 | Temp 97.3°F | Resp 16 | Wt 187.0 lb

## 2012-12-13 DIAGNOSIS — M545 Low back pain, unspecified: Secondary | ICD-10-CM

## 2012-12-13 DIAGNOSIS — G894 Chronic pain syndrome: Secondary | ICD-10-CM

## 2012-12-13 DIAGNOSIS — J309 Allergic rhinitis, unspecified: Secondary | ICD-10-CM

## 2012-12-13 DIAGNOSIS — E039 Hypothyroidism, unspecified: Secondary | ICD-10-CM

## 2012-12-13 DIAGNOSIS — F411 Generalized anxiety disorder: Secondary | ICD-10-CM

## 2012-12-13 DIAGNOSIS — E785 Hyperlipidemia, unspecified: Secondary | ICD-10-CM

## 2012-12-13 DIAGNOSIS — R059 Cough, unspecified: Secondary | ICD-10-CM | POA: Insufficient documentation

## 2012-12-13 DIAGNOSIS — R05 Cough: Secondary | ICD-10-CM

## 2012-12-13 LAB — TSH: TSH: 0.81 u[IU]/mL (ref 0.35–5.50)

## 2012-12-13 MED ORDER — METHYLPREDNISOLONE ACETATE 40 MG/ML IJ SUSP
40.0000 mg | Freq: Once | INTRAMUSCULAR | Status: AC
  Administered 2012-12-13: 40 mg via INTRAMUSCULAR

## 2012-12-13 MED ORDER — PRAVASTATIN SODIUM 40 MG PO TABS: 40.0000 mg | ORAL_TABLET | Freq: Every day | ORAL | Status: DC

## 2012-12-13 MED ORDER — CETIRIZINE HCL 10 MG PO TABS: 10.0000 mg | ORAL_TABLET | Freq: Every day | ORAL | Status: DC

## 2012-12-13 MED ORDER — OXYCODONE-ACETAMINOPHEN 10-650 MG PO TABS: ORAL_TABLET | ORAL | Status: DC

## 2012-12-13 MED ORDER — MOMETASONE FUROATE 50 MCG/ACT NA SUSP: 2.0000 | Freq: Every day | NASAL | Status: DC

## 2012-12-13 MED ORDER — CLONAZEPAM 2 MG PO TABS: ORAL_TABLET | ORAL | Status: DC

## 2012-12-13 MED ORDER — METHYLPREDNISOLONE ACETATE 80 MG/ML IJ SUSP
80.0000 mg | Freq: Once | INTRAMUSCULAR | Status: AC
  Administered 2012-12-13: 80 mg via INTRAMUSCULAR

## 2012-12-13 NOTE — Progress Notes (Signed)
Subjective:    Patient ID: Jonathan Luna, male    DOB: 1966-03-06, 47 y.o.   MRN: [id number]  Cough This is a recurrent problem. The current episode started more than 1 month ago. The problem has been unchanged. The problem occurs every few hours. The cough is productive of sputum. Associated symptoms include nasal congestion, postnasal drip and rhinorrhea. Pertinent negatives include no chest pain, chills, ear congestion, ear pain, fever, headaches, heartburn, hemoptysis, myalgias, rash, sore throat, shortness of breath, sweats, weight loss or wheezing. Nothing aggravates the symptoms. Treatments tried: antibiotics. The treatment provided mild relief.      Review of Systems  Constitutional: Negative for fever, chills, weight loss, diaphoresis, activity change, appetite change, fatigue and unexpected weight change.  HENT: Positive for congestion, rhinorrhea and postnasal drip. Negative for ear pain, nosebleeds, sore throat, facial swelling, sneezing, trouble swallowing, neck pain, dental problem, voice change, sinus pressure and tinnitus.   Eyes: Negative.   Respiratory: Positive for cough. Negative for apnea, hemoptysis, choking, chest tightness, shortness of breath, wheezing and stridor.   Cardiovascular: Negative for chest pain, palpitations and leg swelling.  Gastrointestinal: Negative for heartburn, nausea, vomiting, abdominal pain, diarrhea, constipation and blood in stool.  Endocrine: Negative.   Genitourinary: Negative.   Musculoskeletal: Positive for back pain (chronic,unchanged). Negative for myalgias, joint swelling, arthralgias and gait problem.  Skin: Negative for color change, pallor, rash and wound.  Allergic/Immunologic: Negative.   Neurological: Negative for dizziness, tremors, weakness, light-headedness and headaches.  Hematological: Negative for adenopathy. Does not bruise/bleed easily.  Psychiatric/Behavioral: Negative for suicidal ideas, hallucinations, behavioral problems,  confusion, sleep disturbance, self-injury, dysphoric mood, decreased concentration and agitation. The patient is nervous/anxious. The patient is not hyperactive.        Objective:   Physical Exam  Vitals reviewed. Constitutional: He is oriented to person, place, and time. He appears well-developed and well-nourished.  Non-toxic appearance. He does not have a sickly appearance. He does not appear ill. No distress.  HENT:  Head: Normocephalic and atraumatic. No trismus in the jaw.  Right Ear: Hearing, tympanic membrane, external ear and ear canal normal.  Left Ear: Hearing, tympanic membrane, external ear and ear canal normal.  Nose: Mucosal edema present. No rhinorrhea, sinus tenderness or nasal deformity. No epistaxis.  No foreign bodies. Right sinus exhibits no maxillary sinus tenderness and no frontal sinus tenderness. Left sinus exhibits no maxillary sinus tenderness and no frontal sinus tenderness.  Mouth/Throat: Oropharynx is clear and moist and mucous membranes are normal. Mucous membranes are not pale, not dry and not cyanotic. No oral lesions. No edematous. No oropharyngeal exudate, posterior oropharyngeal edema, posterior oropharyngeal erythema or tonsillar abscesses.  Eyes: Conjunctivae are normal. Right eye exhibits no discharge. Left eye exhibits no discharge. No scleral icterus.  Neck: Normal range of motion. Neck supple. No JVD present. No tracheal deviation present. No thyromegaly present.  Cardiovascular: Normal rate, regular rhythm, normal heart sounds and intact distal pulses.  Exam reveals no gallop and no friction rub.   No murmur heard. Pulmonary/Chest: Effort normal and breath sounds normal. No stridor. No respiratory distress. He has no wheezes. He has no rales. He exhibits no tenderness.  Abdominal: Soft. Bowel sounds are normal. He exhibits no distension and no mass. There is no tenderness. There is no rebound and no guarding.  Musculoskeletal: Normal range of motion. He  exhibits no edema and no tenderness.  Lymphadenopathy:    He has no cervical adenopathy.  Neurological: He is oriented to person,  place, and time.  Skin: Skin is warm and dry. No rash noted. He is not diaphoretic. No erythema. No pallor.  Psychiatric: He has a normal mood and affect. His behavior is normal. Judgment and thought content normal.     Lab Results  Component Value Date   WBC 7.7 07/28/2012   HGB 13.3 07/28/2012   HCT 40.0 07/28/2012   PLT 275.0 07/28/2012   GLUCOSE 89 07/28/2012   CHOL 151 04/12/2012   TRIG 78.0 04/12/2012   HDL 57.80 04/12/2012   LDLDIRECT 156.1 07/08/2007   LDLCALC 78 04/12/2012   ALT 19 07/28/2012   AST 19 07/28/2012   NA 139 07/28/2012   K 4.9 07/28/2012   CL 107 07/28/2012   CREATININE 0.9 07/28/2012   BUN 17 07/28/2012   CO2 27 07/28/2012   TSH 2.01 09/29/2012   PSA 0.85 07/28/2012   HGBA1C 5.2 07/28/2012       Assessment & Plan:

## 2012-12-13 NOTE — Patient Instructions (Signed)

## 2012-12-14 ENCOUNTER — Telehealth: Payer: Self-pay | Admitting: Internal Medicine

## 2012-12-14 NOTE — Assessment & Plan Note (Signed)
He is having a flare so I gave him an injection of depo-medrol IM and I have asked him to start nasonex ns

## 2012-12-14 NOTE — Assessment & Plan Note (Signed)
He will continue percocet as needed

## 2012-12-14 NOTE — Assessment & Plan Note (Signed)
He will continue klonopin as needed

## 2012-12-14 NOTE — Assessment & Plan Note (Signed)
I will check his TSH today 

## 2012-12-14 NOTE — Assessment & Plan Note (Signed)
I will check his CXR to see if he has PNA, I am also concerned about asthma so I gave him an injection of depo-medrol IM

## 2012-12-14 NOTE — Telephone Encounter (Signed)
[facility] Triage Call Report Triage Record Num: [id number] Operator: [employer] Patient Name: Jonathan Luna Call Date & Time: 12/13/2012 9:35:36PM Patient Phone: [phone] PCP: Sanda Linger Patient Gender: Male PCP Fax : Patient DOB: 01-29-1966 Practice Name: [facility] Reason for Call: Caller: [name]/Patient; PCP: Sanda Linger (Adults only); CB#: [phone]; Call regarding sinus pressure with onset 12/11/12; Pt was seen in office today (12/13/12) and diagnosed with Sinusitis and was given a Steroid shot and Nasonex; cough is better, but sinus pressure is not better; ears are popping; Afebrile; Pt states that the mucous he blows out is clear; All emergent sx r/o per Upper Respiratory Infection Protocol; Home care advice given and caller advised to call back with any further conerns or worsening sx. Protocol(s) Used: Upper Respiratory Infection (URI) Recommended Outcome per Protocol: Provide Home/Self Care Reason for Outcome: Ear congestion, pressure, fullness Dry cough following a cold Care Advice: ~ Use a cool mist humidifier to moisten air. Be sure to clean according to manufacturer's instructions. ~ Call provider if symptoms do not resolve in 3 days or if new symptoms develop. Resting or sleeping with head elevated, such as semi-reclining in a recliner, may help reduce inner ear pressure and discomfort. ~ Drink more fluids -- water, low-sugar juices, tea and warm soup, especially chicken broth, are options. Avoid caffeinated or alcoholic beverages because they can increase the chance of dehydration. ~ ~ Call provider if symptoms become severe, or if treatment that relieved the symptoms in the past is no longer working. Dry Cough: Most cough medicines, especially those containing dextromethorphan (DM) should be avoided, as they are not effective. Drinking lots of fluids may help, especially warm fluids. Try a warm mixture of lemon juice and honey in warm  water. ~Analgesic/Antipyretic Advice - NSAIDs: Consider aspirin, ibuprofen, naproxen or ketoprofen for pain or fever as directed on label or by pharmacist/provider. PRECAUTIONS: - If over 91 years of age, should not take longer than 1 week without consulting provider. EXCEPTIONS: - Should not be used if taking blood thinners or have bleeding problems. - Do not use if have history of sensitivity/allergy to any of these medications; or history of cardiovascular, ulcer, kidney, liver disease or diabetes unless approved by provider. - Do not exceed recommended dose or frequency.

## 2012-12-15 ENCOUNTER — Other Ambulatory Visit: Payer: Self-pay | Admitting: Internal Medicine

## 2012-12-15 ENCOUNTER — Telehealth: Payer: Self-pay

## 2012-12-15 DIAGNOSIS — J019 Acute sinusitis, unspecified: Secondary | ICD-10-CM

## 2012-12-15 MED ORDER — AMOXICILLIN 875 MG PO TABS: 875.0000 mg | ORAL_TABLET | Freq: Two times a day (BID) | ORAL | Status: DC

## 2012-12-15 MED ORDER — OXYCODONE-ACETAMINOPHEN 10-325 MG PO TABS: 1.0000 | ORAL_TABLET | ORAL | Status: DC | PRN

## 2012-12-15 NOTE — Telephone Encounter (Signed)
Caller: Jonathan Luna/Patient; PCP: Sanda Linger (Adults only); CB#: [phone]; Call regarding  sinus pressure with onset 12/11/12; Pt was seen in office today (12/13/12) and diagnosed with  Sinusitis and was given a Steroid shot and Nasonex; cough is better, but sinus pressure is not  better; ears are popping; Afebrile; Pt states that the mucous he blows out is clear; All  emergent sx r/o per Upper Respiratory Infection Protocol; Home care advice given and caller  advised to call back with any further conerns or worsening sx.  Protocol(s) Used: Upper Respiratory Infection (URI)  Recommended Outcome per Protocol: Provide Home/Self Care  Reason for Outcome: Ear congestion, pressure, fullness  Dry cough following a cold

## 2012-12-15 NOTE — Telephone Encounter (Signed)
Patient states that he does not feel any better since depo injection and nasonex. 1) Would like to know if abx is needed or what else to try. 2) Also pt given rx for oxy 10-650 but due to the acetaminophen warning he has not been able to get fill. He is here to turn in old script for a new one that can be filled. Please advise Thanks

## 2012-12-15 NOTE — Addendum Note (Signed)
Addended by: [name] on: 12/15/2012 04:43 PM   Modules accepted: Orders, Medications

## 2012-12-16 ENCOUNTER — Encounter: Payer: Self-pay | Admitting: Internal Medicine

## 2012-12-16 LAB — ~~LOC~~ ALLERGY PANEL
Allergen, Mulberry, t76: <0.1 kU/l
Bahia Grass: <0.1 kU/L
Cladosporium Herbarum: <0.1 kU/L
Cockroach: <0.1 kU/L
Dog Dander: <0.1 kU/L
Elm IgE: <0.1 kU/L
Johnson Grass: <0.1 kU/L
Mugwort: <0.1 kU/L
Oak: <0.1 kU/L
Pecan/Hickory Tree IgE: <0.1 kU/L
Penicillium Notatum: <0.1 kU/L
Rough Pigweed  IgE: <0.1 kU/L
Stemphylium Botryosum: <0.1 kU/L

## 2013-03-08 ENCOUNTER — Telehealth: Payer: Self-pay | Admitting: Internal Medicine

## 2013-03-08 DIAGNOSIS — M545 Low back pain, unspecified: Secondary | ICD-10-CM

## 2013-03-08 DIAGNOSIS — G894 Chronic pain syndrome: Secondary | ICD-10-CM

## 2013-03-08 MED ORDER — OXYCODONE-ACETAMINOPHEN 10-325 MG PO TABS: 1.0000 | ORAL_TABLET | ORAL | Status: DC | PRN

## 2013-03-08 NOTE — Telephone Encounter (Signed)
Pt notified to pick up 

## 2013-03-08 NOTE — Telephone Encounter (Signed)
Pt called req refill for Oxycodone 10/325mg . Please advise

## 2013-03-08 NOTE — Telephone Encounter (Signed)
done

## 2013-03-19 ENCOUNTER — Other Ambulatory Visit: Payer: Self-pay | Admitting: Internal Medicine

## 2013-05-17 ENCOUNTER — Telehealth: Payer: Self-pay

## 2013-05-17 NOTE — Telephone Encounter (Signed)
Please advise if ok to refill oxy for this pt. Thanks

## 2013-05-18 NOTE — Telephone Encounter (Signed)
Message routed to scheduler to set up Thanks

## 2013-05-18 NOTE — Telephone Encounter (Signed)
He needs to be seen

## 2013-05-18 NOTE — Telephone Encounter (Signed)
Called pt, left vm for pt to call back and schedule an appt with Dr. Yetta Barre.

## 2013-05-20 ENCOUNTER — Encounter: Payer: Self-pay | Admitting: Internal Medicine

## 2013-05-20 ENCOUNTER — Other Ambulatory Visit (INDEPENDENT_AMBULATORY_CARE_PROVIDER_SITE_OTHER): Payer: BC Managed Care – PPO

## 2013-05-20 ENCOUNTER — Ambulatory Visit (INDEPENDENT_AMBULATORY_CARE_PROVIDER_SITE_OTHER): Payer: BC Managed Care – PPO | Admitting: Internal Medicine

## 2013-05-20 VITALS — BP 120/80 | HR 78 | Temp 97.5°F | Resp 16 | Wt 206.0 lb

## 2013-05-20 DIAGNOSIS — M545 Low back pain, unspecified: Secondary | ICD-10-CM

## 2013-05-20 DIAGNOSIS — G894 Chronic pain syndrome: Secondary | ICD-10-CM

## 2013-05-20 DIAGNOSIS — E785 Hyperlipidemia, unspecified: Secondary | ICD-10-CM

## 2013-05-20 DIAGNOSIS — E039 Hypothyroidism, unspecified: Secondary | ICD-10-CM

## 2013-05-20 DIAGNOSIS — F329 Major depressive disorder, single episode, unspecified: Secondary | ICD-10-CM

## 2013-05-20 DIAGNOSIS — F410 Panic disorder [episodic paroxysmal anxiety] without agoraphobia: Secondary | ICD-10-CM

## 2013-05-20 DIAGNOSIS — F411 Generalized anxiety disorder: Secondary | ICD-10-CM

## 2013-05-20 LAB — LIPID PANEL
Cholesterol: 150 mg/dL (ref 0–200)
HDL: 39.3 mg/dL (ref >39.00)
LDL Cholesterol: 99 mg/dL (ref 0–99)
Triglycerides: 60 mg/dL (ref 0.0–149.0)
VLDL: 12 mg/dL (ref 0.0–40.0)

## 2013-05-20 MED ORDER — TRAZODONE HCL 100 MG PO TABS: 100.0000 mg | ORAL_TABLET | Freq: Every day | ORAL | Status: DC

## 2013-05-20 MED ORDER — LEVOTHYROXINE SODIUM 150 MCG PO TABS: 137.0000 ug | ORAL_TABLET | Freq: Every day | ORAL | Status: DC

## 2013-05-20 MED ORDER — OXYCODONE-ACETAMINOPHEN 10-325 MG PO TABS: 1.0000 | ORAL_TABLET | ORAL | Status: DC | PRN

## 2013-05-20 MED ORDER — CLONAZEPAM 2 MG PO TABS: ORAL_TABLET | ORAL | Status: DC

## 2013-05-20 NOTE — Patient Instructions (Signed)

## 2013-05-20 NOTE — Progress Notes (Signed)
Subjective:    Patient ID: Jonathan Luna, male    DOB: 05/22/1966, 47 y.o.   MRN: [id number]  Thyroid Problem Presents for follow-up visit. Symptoms include weight gain. Patient reports no anxiety, cold intolerance, constipation, depressed mood, diaphoresis, diarrhea, dry skin, fatigue, hair loss, heat intolerance, hoarse voice, leg swelling, nail problem, palpitations, tremors, visual change or weight loss. The symptoms have been stable.      Review of Systems  Constitutional: Positive for weight gain and unexpected weight change (weight gain). Negative for fever, chills, weight loss, diaphoresis, activity change, appetite change and fatigue.  HENT: Negative.  Negative for hoarse voice.   Eyes: Negative.   Respiratory: Negative.  Negative for apnea, cough, choking, shortness of breath, wheezing and stridor.   Cardiovascular: Negative.  Negative for chest pain, palpitations and leg swelling.  Gastrointestinal: Negative.  Negative for nausea, vomiting, abdominal pain, diarrhea, constipation and blood in stool.  Endocrine: Negative.  Negative for cold intolerance and heat intolerance.  Genitourinary: Negative.   Musculoskeletal: Positive for back pain (chronic, unchanged, controlled by percocet 1-2 dose per day). Negative for myalgias, joint swelling, arthralgias and gait problem.  Skin: Negative for color change, pallor, rash and wound.  Allergic/Immunologic: Negative.   Neurological: Negative.  Negative for tremors.  Hematological: Negative.  Negative for adenopathy. Does not bruise/bleed easily.  Psychiatric/Behavioral: Positive for sleep disturbance. Negative for suicidal ideas, hallucinations, behavioral problems, confusion, self-injury, dysphoric mood, decreased concentration and agitation. The patient is nervous/anxious. The patient is not hyperactive.        Objective:   Physical Exam  Vitals reviewed. Constitutional: He is oriented to person, place, and time. He appears  well-developed and well-nourished. No distress.  HENT:  Head: Normocephalic and atraumatic.  Mouth/Throat: Oropharynx is clear and moist. No oropharyngeal exudate.  Eyes: Conjunctivae are normal. Right eye exhibits no discharge. Left eye exhibits no discharge. No scleral icterus.  Neck: Normal range of motion. Neck supple. No JVD present. No tracheal deviation present. No thyromegaly present.  Cardiovascular: Normal rate, regular rhythm, normal heart sounds and intact distal pulses.  Exam reveals no gallop and no friction rub.   No murmur heard. Pulmonary/Chest: Effort normal and breath sounds normal. No stridor. No respiratory distress. He has no wheezes. He has no rales. He exhibits no tenderness.  Abdominal: Soft. Bowel sounds are normal. He exhibits no distension and no mass. There is no tenderness. There is no rebound and no guarding.  Musculoskeletal: Normal range of motion. He exhibits no edema and no tenderness.       Lumbar back: Normal. He exhibits normal range of motion, no tenderness, no bony tenderness, no swelling, no edema, no deformity, no laceration, no pain, no spasm and normal pulse.  Lymphadenopathy:    He has no cervical adenopathy.  Neurological: He is alert and oriented to person, place, and time. He has normal strength. He displays no atrophy, no tremor and normal reflexes. No cranial nerve deficit or sensory deficit. He exhibits normal muscle tone. He displays a negative Romberg sign. He displays no seizure activity. Coordination and gait normal.  Reflex Scores:      Tricep reflexes are 1+ on the right side and 1+ on the left side.      Bicep reflexes are 1+ on the right side and 1+ on the left side.      Brachioradialis reflexes are 1+ on the right side.      Patellar reflexes are 1+ on the right side and 1+ on  the left side.      Achilles reflexes are 1+ on the right side and 1+ on the left side. Neg SLR in BLE  Skin: Skin is warm and dry. No rash noted. He is not  diaphoretic. No erythema. No pallor.  Psychiatric: He has a normal mood and affect. His behavior is normal. Judgment and thought content normal.     Lab Results  Component Value Date   WBC 7.7 07/28/2012   HGB 13.3 07/28/2012   HCT 40.0 07/28/2012   PLT 275.0 07/28/2012   GLUCOSE 89 07/28/2012   CHOL 151 04/12/2012   TRIG 78.0 04/12/2012   HDL 57.80 04/12/2012   LDLDIRECT 156.1 07/08/2007   LDLCALC 78 04/12/2012   ALT 19 07/28/2012   AST 19 07/28/2012   NA 139 07/28/2012   K 4.9 07/28/2012   CL 107 07/28/2012   CREATININE 0.9 07/28/2012   BUN 17 07/28/2012   CO2 27 07/28/2012   TSH 0.81 12/13/2012   PSA 0.85 07/28/2012   HGBA1C 5.2 07/28/2012        Assessment & Plan:

## 2013-05-22 NOTE — Assessment & Plan Note (Signed)
There is no change in his clinical findings today He will continue percocet as needed

## 2013-05-22 NOTE — Assessment & Plan Note (Signed)
Will continue clonazepm as needed

## 2013-05-22 NOTE — Assessment & Plan Note (Signed)
His TSH is in the normal range No change in the dose

## 2013-05-22 NOTE — Assessment & Plan Note (Signed)
continue clonazepam as needed

## 2013-05-22 NOTE — Assessment & Plan Note (Signed)
FLP today 

## 2013-06-30 ENCOUNTER — Other Ambulatory Visit: Payer: Self-pay | Admitting: Internal Medicine

## 2013-07-01 ENCOUNTER — Telehealth: Payer: Self-pay

## 2013-07-01 DIAGNOSIS — M545 Low back pain, unspecified: Secondary | ICD-10-CM

## 2013-07-01 DIAGNOSIS — G894 Chronic pain syndrome: Secondary | ICD-10-CM

## 2013-07-01 MED ORDER — OXYCODONE-ACETAMINOPHEN 10-325 MG PO TABS: 1.0000 | ORAL_TABLET | ORAL | Status: DC | PRN

## 2013-07-01 NOTE — Telephone Encounter (Signed)
Pt called [facility] request rx for Oxy, last filled 05/20/13 and pt last seen 05/20/13. Thanks

## 2013-07-01 NOTE — Telephone Encounter (Signed)
Pt picked up.

## 2013-08-19 ENCOUNTER — Other Ambulatory Visit: Payer: Self-pay

## 2013-08-19 NOTE — Telephone Encounter (Signed)
Please advise if ok to refill Oxy for pt, time for monthly RX   

## 2013-08-22 ENCOUNTER — Telehealth: Payer: Self-pay

## 2013-08-22 DIAGNOSIS — G894 Chronic pain syndrome: Secondary | ICD-10-CM

## 2013-08-22 DIAGNOSIS — M545 Low back pain, unspecified: Secondary | ICD-10-CM

## 2013-08-22 NOTE — Telephone Encounter (Signed)
ok 

## 2013-08-22 NOTE — Telephone Encounter (Signed)
Please advise if ok to refill Oxy for pt, time for monthly RX   

## 2013-08-23 MED ORDER — OXYCODONE-ACETAMINOPHEN 10-325 MG PO TABS: 1.0000 | ORAL_TABLET | ORAL | Status: DC | PRN

## 2013-08-23 NOTE — Telephone Encounter (Signed)
Returned call to patient//[facility] to stop by office and pick up Rx this morning around 10 am.

## 2013-10-05 ENCOUNTER — Telehealth: Payer: Self-pay

## 2013-10-05 DIAGNOSIS — G894 Chronic pain syndrome: Secondary | ICD-10-CM

## 2013-10-05 DIAGNOSIS — M545 Low back pain, unspecified: Secondary | ICD-10-CM

## 2013-10-05 MED ORDER — OXYCODONE-ACETAMINOPHEN 10-325 MG PO TABS: 1.0000 | ORAL_TABLET | ORAL | Status: DC | PRN

## 2013-10-05 NOTE — Telephone Encounter (Signed)
Please advise if ok to refill Oxy for pt, time for monthly RX

## 2013-10-05 NOTE — Telephone Encounter (Signed)
done

## 2013-11-17 ENCOUNTER — Telehealth: Payer: Self-pay

## 2013-11-17 NOTE — Telephone Encounter (Signed)
Patient transferred [facility] requesting medication refill for Oxycodone, he requesting to pick up today. Pt has not been seen since 05/20/13, must be seen every three months for controlled.

## 2013-11-17 NOTE — Telephone Encounter (Signed)
Appt Monday for a follow up and refills.

## 2013-11-21 ENCOUNTER — Ambulatory Visit (INDEPENDENT_AMBULATORY_CARE_PROVIDER_SITE_OTHER): Payer: BC Managed Care – PPO | Admitting: Internal Medicine

## 2013-11-21 ENCOUNTER — Other Ambulatory Visit (INDEPENDENT_AMBULATORY_CARE_PROVIDER_SITE_OTHER): Payer: BC Managed Care – PPO

## 2013-11-21 ENCOUNTER — Encounter: Payer: Self-pay | Admitting: Internal Medicine

## 2013-11-21 VITALS — BP 120/68 | HR 84 | Temp 97.6°F | Resp 16 | Ht 67.0 in | Wt 220.0 lb

## 2013-11-21 DIAGNOSIS — M545 Low back pain, unspecified: Secondary | ICD-10-CM

## 2013-11-21 DIAGNOSIS — E039 Hypothyroidism, unspecified: Secondary | ICD-10-CM

## 2013-11-21 DIAGNOSIS — L301 Dyshidrosis [pompholyx]: Secondary | ICD-10-CM

## 2013-11-21 DIAGNOSIS — G894 Chronic pain syndrome: Secondary | ICD-10-CM

## 2013-11-21 LAB — TSH: TSH: 1.06 u[IU]/mL (ref 0.35–5.50)

## 2013-11-21 MED ORDER — TRIAMCINOLONE ACETONIDE 0.5 % EX CREA: 1.0000 "application " | TOPICAL_CREAM | Freq: Three times a day (TID) | CUTANEOUS | Status: DC

## 2013-11-21 MED ORDER — OXYCODONE-ACETAMINOPHEN 10-325 MG PO TABS: 1.0000 | ORAL_TABLET | ORAL | Status: DC | PRN

## 2013-11-21 NOTE — Patient Instructions (Signed)

## 2013-11-21 NOTE — Assessment & Plan Note (Signed)
Will treat with TAC cream 

## 2013-11-21 NOTE — Assessment & Plan Note (Signed)
I will recheck his TSH and will adjust his dose if needed 

## 2013-11-21 NOTE — Progress Notes (Signed)
Subjective:    Patient ID: Jonathan Luna, male    DOB: July 21, 1966, 48 y.o.   MRN: [id number]  Thyroid Problem Presents for follow-up visit. Symptoms include anxiety, dry skin, fatigue and weight gain. Patient reports no cold intolerance, constipation, depressed mood, diaphoresis, diarrhea, hair loss, heat intolerance, hoarse voice, leg swelling, nail problem, palpitations, tremors, visual change or weight loss. The symptoms have been worsening. Past treatments include levothyroxine. The treatment provided mild relief.      Review of Systems  Constitutional: Positive for weight gain, fatigue and unexpected weight change. Negative for fever, chills, weight loss, diaphoresis, activity change and appetite change.  HENT: Negative.  Negative for hoarse voice.   Eyes: Negative.   Respiratory: Negative.  Negative for apnea, cough, choking, chest tightness, shortness of breath, wheezing and stridor.   Cardiovascular: Negative.  Negative for chest pain, palpitations and leg swelling.  Gastrointestinal: Negative.  Negative for nausea, vomiting, abdominal pain, diarrhea and constipation.  Endocrine: Negative.  Negative for cold intolerance and heat intolerance.  Genitourinary: Negative.   Musculoskeletal: Positive for back pain. Negative for arthralgias, gait problem, joint swelling, myalgias, neck pain and neck stiffness.       He has chronic/stable aching low back pain that responds well to the current meds for pain.  Skin: Positive for rash. Negative for color change, pallor and wound.       He has scattered areas of dry/itchy skin over his legs and arms.  Allergic/Immunologic: Negative.   Neurological: Negative.  Negative for tremors.  Hematological: Negative.  Negative for adenopathy. Does not bruise/bleed easily.  Psychiatric/Behavioral: Negative for suicidal ideas, hallucinations, behavioral problems, confusion, sleep disturbance, self-injury, dysphoric mood, decreased concentration and  agitation. The patient is nervous/anxious. The patient is not hyperactive.        Objective:   Physical Exam  Vitals reviewed. Constitutional: He is oriented to person, place, and time. He appears well-developed and well-nourished. No distress.  HENT:  Head: Normocephalic and atraumatic.  Mouth/Throat: Oropharynx is clear and moist. No oropharyngeal exudate.  Eyes: Conjunctivae are normal. Right eye exhibits no discharge. Left eye exhibits no discharge. No scleral icterus.  Neck: Normal range of motion. Neck supple. No JVD present. No tracheal deviation present. No thyromegaly present.  Cardiovascular: Normal rate, regular rhythm, normal heart sounds and intact distal pulses.  Exam reveals no gallop and no friction rub.   No murmur heard. Pulmonary/Chest: Effort normal and breath sounds normal. No stridor. No respiratory distress. He has no wheezes. He has no rales. He exhibits no tenderness.  Abdominal: Soft. Bowel sounds are normal. He exhibits no distension and no mass. There is no tenderness. There is no rebound and no guarding.  Musculoskeletal: Normal range of motion. He exhibits no edema and no tenderness.       Lumbar back: Normal. He exhibits normal range of motion, no tenderness, no bony tenderness, no swelling, no edema, no deformity, no laceration, no pain, no spasm and normal pulse.  Lymphadenopathy:    He has no cervical adenopathy.  Neurological: He is alert and oriented to person, place, and time. He has normal strength. He displays no atrophy, no tremor and normal reflexes. No cranial nerve deficit or sensory deficit. He exhibits normal muscle tone. He displays a negative Romberg sign. He displays no seizure activity. Coordination and gait normal.  Reflex Scores:      Tricep reflexes are 1+ on the right side and 1+ on the left side.  Bicep reflexes are 1+ on the right side and 1+ on the left side.      Brachioradialis reflexes are 1+ on the right side and 1+ on the left  side.      Patellar reflexes are 1+ on the right side and 1+ on the left side.      Achilles reflexes are 1+ on the right side and 1+ on the left side. Skin: Skin is warm, dry and intact. Rash noted. No purpura noted. Rash is papular. Rash is not macular, not maculopapular, not nodular, not pustular, not vesicular and not urticarial. He is not diaphoretic. No erythema. No pallor.  There are scattered patches of severe xerosis with slight erythema and scale.  Psychiatric: He has a normal mood and affect. His behavior is normal. Judgment and thought content normal.      Lab Results  Component Value Date   WBC 7.7 07/28/2012   HGB 13.3 07/28/2012   HCT 40.0 07/28/2012   PLT 275.0 07/28/2012   GLUCOSE 89 07/28/2012   CHOL 150 05/20/2013   TRIG 60.0 05/20/2013   HDL 39.30 05/20/2013   LDLDIRECT 156.1 07/08/2007   LDLCALC 99 05/20/2013   ALT 19 07/28/2012   AST 19 07/28/2012   NA 139 07/28/2012   K 4.9 07/28/2012   CL 107 07/28/2012   CREATININE 0.9 07/28/2012   BUN 17 07/28/2012   CO2 27 07/28/2012   TSH 2.80 05/20/2013   PSA 0.85 07/28/2012   HGBA1C 5.2 07/28/2012      Assessment & Plan:

## 2013-11-21 NOTE — Progress Notes (Signed)
Pre visit review using our clinic review tool, if applicable. No additional management support is needed unless otherwise documented below in the visit note. 

## 2013-11-21 NOTE — Assessment & Plan Note (Signed)
He will cont the current meds for pain 

## 2013-11-30 ENCOUNTER — Telehealth: Payer: Self-pay

## 2013-11-30 NOTE — Telephone Encounter (Signed)
RX called in to [facility]

## 2013-11-30 NOTE — Telephone Encounter (Signed)
yes

## 2013-11-30 NOTE — Telephone Encounter (Signed)
Please advise if ok to refill clonazepam for this pt. Thanks

## 2013-12-01 ENCOUNTER — Other Ambulatory Visit: Payer: Self-pay | Admitting: *Deleted

## 2013-12-01 DIAGNOSIS — F411 Generalized anxiety disorder: Secondary | ICD-10-CM

## 2013-12-01 DIAGNOSIS — F410 Panic disorder [episodic paroxysmal anxiety] without agoraphobia: Secondary | ICD-10-CM

## 2013-12-01 MED ORDER — CLONAZEPAM 2 MG PO TABS: ORAL_TABLET | ORAL | Status: DC

## 2013-12-15 ENCOUNTER — Encounter: Payer: Self-pay | Admitting: Internal Medicine

## 2014-02-08 ENCOUNTER — Telehealth: Payer: Self-pay | Admitting: Internal Medicine

## 2014-02-08 DIAGNOSIS — F410 Panic disorder [episodic paroxysmal anxiety] without agoraphobia: Secondary | ICD-10-CM

## 2014-02-08 DIAGNOSIS — F411 Generalized anxiety disorder: Secondary | ICD-10-CM

## 2014-02-08 MED ORDER — CLONAZEPAM 2 MG PO TABS: ORAL_TABLET | ORAL | Status: DC

## 2014-02-08 NOTE — Telephone Encounter (Signed)
Refill request for Clonazepam 2 mg tablet. Last OV 11/21/13. Please advise.

## 2014-02-08 NOTE — Telephone Encounter (Signed)
ok 

## 2014-02-08 NOTE — Addendum Note (Signed)
Addended by: Estell Harpin T on: 02/08/2014 02:39 PM   Modules accepted: Orders

## 2014-02-08 NOTE — Telephone Encounter (Signed)
Dr. Ronnald Ramp said OK to refill.

## 2014-02-10 ENCOUNTER — Other Ambulatory Visit: Payer: Self-pay | Admitting: Internal Medicine

## 2014-02-22 ENCOUNTER — Ambulatory Visit (INDEPENDENT_AMBULATORY_CARE_PROVIDER_SITE_OTHER)
Admission: RE | Admit: 2014-02-22 | Discharge: 2014-02-22 | Disposition: A | Discharge status: Home / Self Care | Payer: BC Managed Care – PPO | Source: Ambulatory Visit | Attending: Internal Medicine | Admitting: Internal Medicine

## 2014-02-22 ENCOUNTER — Telehealth: Payer: Self-pay

## 2014-02-22 ENCOUNTER — Ambulatory Visit (INDEPENDENT_AMBULATORY_CARE_PROVIDER_SITE_OTHER): Payer: BC Managed Care – PPO | Admitting: Internal Medicine

## 2014-02-22 ENCOUNTER — Encounter: Payer: Self-pay | Admitting: Internal Medicine

## 2014-02-22 ENCOUNTER — Telehealth: Payer: Self-pay | Admitting: Internal Medicine

## 2014-02-22 VITALS — BP 132/86 | HR 93 | Temp 98.1°F | Resp 16 | Ht 67.0 in | Wt 224.8 lb

## 2014-02-22 DIAGNOSIS — R059 Cough, unspecified: Secondary | ICD-10-CM

## 2014-02-22 DIAGNOSIS — G894 Chronic pain syndrome: Secondary | ICD-10-CM

## 2014-02-22 DIAGNOSIS — R05 Cough: Secondary | ICD-10-CM

## 2014-02-22 DIAGNOSIS — J189 Pneumonia, unspecified organism: Secondary | ICD-10-CM

## 2014-02-22 DIAGNOSIS — M545 Low back pain, unspecified: Secondary | ICD-10-CM

## 2014-02-22 DIAGNOSIS — K589 Irritable bowel syndrome without diarrhea: Secondary | ICD-10-CM

## 2014-02-22 DIAGNOSIS — F411 Generalized anxiety disorder: Secondary | ICD-10-CM

## 2014-02-22 DIAGNOSIS — E039 Hypothyroidism, unspecified: Secondary | ICD-10-CM

## 2014-02-22 DIAGNOSIS — F410 Panic disorder [episodic paroxysmal anxiety] without agoraphobia: Secondary | ICD-10-CM

## 2014-02-22 DIAGNOSIS — R052 Subacute cough: Secondary | ICD-10-CM | POA: Insufficient documentation

## 2014-02-22 MED ORDER — OXYCODONE-ACETAMINOPHEN 10-325 MG PO TABS: 1.0000 | ORAL_TABLET | ORAL | Status: DC | PRN

## 2014-02-22 MED ORDER — MOXIFLOXACIN HCL 400 MG PO TABS: 400.0000 mg | ORAL_TABLET | Freq: Every day | ORAL | Status: AC

## 2014-02-22 MED ORDER — PROMETHAZINE-DM 6.25-15 MG/5ML PO SYRP: 5.0000 mL | ORAL_SOLUTION | Freq: Four times a day (QID) | ORAL | Status: DC | PRN

## 2014-02-22 NOTE — Assessment & Plan Note (Signed)
I will treat the infection with avelox and will control the cough with phenergan-dm

## 2014-02-22 NOTE — Telephone Encounter (Signed)
The patient called and is hoping to get a different cough medicine called in. He states the one he was prescribed makes him too sleepy to work.   Thanks!

## 2014-02-22 NOTE — Telephone Encounter (Signed)
Then he should only take it when he can rest

## 2014-02-22 NOTE — Patient Instructions (Signed)

## 2014-02-22 NOTE — Assessment & Plan Note (Signed)
He will cont klonopin as needed

## 2014-02-22 NOTE — Telephone Encounter (Signed)
Called in 90 day supply of rx Provastatin to [facility].

## 2014-02-22 NOTE — Progress Notes (Signed)
Pre visit review using our clinic review tool, if applicable. No additional management support is needed unless otherwise documented below in the visit note. 

## 2014-02-22 NOTE — Assessment & Plan Note (Signed)
He is doing on percocet for low back pain Will cont that for now

## 2014-02-22 NOTE — Progress Notes (Signed)
Subjective:    Patient ID: Jonathan Luna, male    DOB: August 22, 1966, 48 y.o.   MRN: [id number]  Cough This is a new problem. The current episode started in the past 7 days. The problem has been gradually worsening. The problem occurs every few hours. The cough is productive of purulent sputum. Associated symptoms include chills, a sore throat and sweats. Pertinent negatives include no chest pain, ear congestion, ear pain, fever, headaches, heartburn, hemoptysis, myalgias, nasal congestion, postnasal drip, rash, rhinorrhea, shortness of breath, weight loss or wheezing. He has tried OTC cough suppressant for the symptoms. The treatment provided mild relief. There is no history of asthma, bronchiectasis, bronchitis, COPD, emphysema, environmental allergies or pneumonia.      Review of Systems  Constitutional: Positive for chills. Negative for fever, weight loss, diaphoresis, activity change, appetite change, fatigue and unexpected weight change.  HENT: Positive for sinus pressure and sore throat. Negative for congestion, ear pain, facial swelling, postnasal drip, rhinorrhea, sneezing, tinnitus and trouble swallowing.   Eyes: Negative.   Respiratory: Positive for cough. Negative for apnea, hemoptysis, choking, chest tightness, shortness of breath, wheezing and stridor.   Cardiovascular: Negative.  Negative for chest pain.  Gastrointestinal: Negative.  Negative for heartburn, nausea, vomiting, abdominal pain, diarrhea, constipation and blood in stool.  Endocrine: Negative.   Genitourinary: Negative.   Musculoskeletal: Positive for back pain. Negative for arthralgias, gait problem, joint swelling, myalgias, neck pain and neck stiffness.  Skin: Negative.  Negative for rash.  Allergic/Immunologic: Negative.  Negative for environmental allergies.  Neurological: Negative.  Negative for dizziness, tremors, weakness and headaches.  Hematological: Negative.  Negative for adenopathy. Does not bruise/bleed  easily.  Psychiatric/Behavioral: Negative for suicidal ideas, hallucinations, behavioral problems, confusion, sleep disturbance, self-injury, dysphoric mood, decreased concentration and agitation. The patient is nervous/anxious. The patient is not hyperactive.        Objective:   Physical Exam  Vitals reviewed. Constitutional: He is oriented to person, place, and time. He appears well-developed and well-nourished.  Non-toxic appearance. He does not have a sickly appearance. He does not appear ill. No distress.  HENT:  Head: Normocephalic and atraumatic.  Mouth/Throat: Oropharynx is clear and moist. No oropharyngeal exudate.  Eyes: Conjunctivae are normal. Right eye exhibits no discharge. Left eye exhibits no discharge. No scleral icterus.  Neck: Normal range of motion. Neck supple. No JVD present. No tracheal deviation present. No thyromegaly present.  Cardiovascular: Normal rate, regular rhythm, normal heart sounds and intact distal pulses.  Exam reveals no gallop and no friction rub.   No murmur heard. Pulmonary/Chest: Effort normal and breath sounds normal. No stridor. No respiratory distress. He has no wheezes. He has no rales. He exhibits no tenderness.  Abdominal: Soft. Bowel sounds are normal. He exhibits no distension and no mass. There is no tenderness. There is no rebound and no guarding.  Musculoskeletal: Normal range of motion. He exhibits no edema and no tenderness.       Lumbar back: Normal. He exhibits normal range of motion, no tenderness, no bony tenderness, no swelling, no edema, no deformity, no laceration, no pain, no spasm and normal pulse.  Lymphadenopathy:    He has no cervical adenopathy.  Neurological: He is oriented to person, place, and time.  Skin: Skin is warm and dry. No rash noted. He is not diaphoretic. No erythema. No pallor.  Psychiatric: He has a normal mood and affect. His behavior is normal. Judgment and thought content normal.     Lab  Results    Component Value Date   WBC 7.7 07/28/2012   HGB 13.3 07/28/2012   HCT 40.0 07/28/2012   PLT 275.0 07/28/2012   GLUCOSE 89 07/28/2012   CHOL 150 05/20/2013   TRIG 60.0 05/20/2013   HDL 39.30 05/20/2013   LDLDIRECT 156.1 07/08/2007   LDLCALC 99 05/20/2013   ALT 19 07/28/2012   AST 19 07/28/2012   NA 139 07/28/2012   K 4.9 07/28/2012   CL 107 07/28/2012   CREATININE 0.9 07/28/2012   BUN 17 07/28/2012   CO2 27 07/28/2012   TSH 1.06 11/21/2013   PSA 0.85 07/28/2012   HGBA1C 5.2 07/28/2012       Assessment & Plan:

## 2014-02-22 NOTE — Assessment & Plan Note (Signed)
I will check his CXR to look for mass, edema, PNA

## 2014-02-22 NOTE — Assessment & Plan Note (Signed)
I will check his TSH and will adjust his dose if needed 

## 2014-02-23 NOTE — Telephone Encounter (Signed)
Called and voicemail was full and not able to LVMM

## 2014-03-05 ENCOUNTER — Ambulatory Visit (INDEPENDENT_AMBULATORY_CARE_PROVIDER_SITE_OTHER): Payer: BC Managed Care – PPO | Admitting: Emergency Medicine

## 2014-03-05 ENCOUNTER — Encounter: Payer: Self-pay | Admitting: Emergency Medicine

## 2014-03-05 VITALS — BP 134/84 | HR 130 | Resp 18

## 2014-03-05 DIAGNOSIS — S61209A Unspecified open wound of unspecified finger without damage to nail, initial encounter: Secondary | ICD-10-CM

## 2014-03-05 DIAGNOSIS — S61218A Laceration without foreign body of other finger without damage to nail, initial encounter: Secondary | ICD-10-CM

## 2014-03-05 DIAGNOSIS — J329 Chronic sinusitis, unspecified: Secondary | ICD-10-CM

## 2014-03-05 MED ORDER — MOXIFLOXACIN HCL 400 MG PO TABS
400.0000 mg | ORAL_TABLET | Freq: Every day | ORAL | Status: DC
Start: 2014-03-05 — End: 2014-05-24

## 2014-03-05 NOTE — Progress Notes (Signed)
Procedure Note: Verbal consent obtained from the patient.  Metacarpal block with 2 cc Lidocaine 2% without epinephrine.  Wound scrubbed with soap and water.  Wound explored.  No foreign bodies or deep structure injury noted.  Wound closed with #3 simple interrupted sutures of 5-0 ethilon.  Area cleansed and dressed.  Pt tolerated very well.  Discussed wound care.  Anticipate suture removal in 7-10 days

## 2014-03-05 NOTE — Patient Instructions (Signed)

## 2014-03-05 NOTE — Progress Notes (Signed)
   Subjective:    Patient ID: Jonathan Luna, male    DOB: 09-10-1966, 48 y.o.   MRN: [id number]  HPI patient states he was using his hedge trimmer and a twig became lodged in the blades. He removed it but the hedge trimmer was still running and he suffered a laceration of his right index finger. His tetanus immunizations are up-to-date    Review of Systems patient is also currently under treatment for sinus infection. He is currently on Avelox one a day.     Objective:   Physical Exam there is a 1 cm flap-like laceration over the flexor surface middle phalanx radial side right hand. Neurovascular is intact. Sensation is intact. He has normal flexion and extension of the finger        Assessment & Plan:  Patient here with a laceration to his right index finger. He has normal neurovascular function. He is up-to-date on tetanus. 1 repair will be performed . Have added Avelox for 3 more days to finish a course for sinusitis

## 2014-03-17 ENCOUNTER — Encounter: Payer: Self-pay | Admitting: Physician Assistant

## 2014-03-17 ENCOUNTER — Ambulatory Visit (INDEPENDENT_AMBULATORY_CARE_PROVIDER_SITE_OTHER): Payer: BC Managed Care – PPO | Admitting: Physician Assistant

## 2014-03-17 VITALS — BP 118/80 | HR 106 | Temp 98.3°F | Resp 16 | Ht 67.5 in | Wt 220.0 lb

## 2014-03-17 DIAGNOSIS — L237 Allergic contact dermatitis due to plants, except food: Secondary | ICD-10-CM

## 2014-03-17 DIAGNOSIS — S61219A Laceration without foreign body of unspecified finger without damage to nail, initial encounter: Secondary | ICD-10-CM

## 2014-03-17 DIAGNOSIS — Z4802 Encounter for removal of sutures: Secondary | ICD-10-CM

## 2014-03-17 DIAGNOSIS — L255 Unspecified contact dermatitis due to plants, except food: Secondary | ICD-10-CM

## 2014-03-17 MED ORDER — MUPIROCIN 2 % EX OINT: 1.0000 "application " | TOPICAL_OINTMENT | Freq: Two times a day (BID) | CUTANEOUS | Status: DC

## 2014-03-17 MED ORDER — PREDNISONE 20 MG PO TABS: ORAL_TABLET | ORAL | Status: DC

## 2014-03-17 NOTE — Progress Notes (Signed)
Patient ID: Jonathan Luna MRN: [id number], DOB: [birth year] 48 y.o. Date of Encounter: 03/17/2014, 8:35 AM  Primary Physician: Scarlette Calico, MD  Chief Complaint: Suture removal    See note from 03/05/14  HPI: 48 y.o. male with injury to right 2nd digit. Here for suture removal s/p placement on 03/05/14 Had 3 simple interrupted sutures of 5-0 Ethilon placed.  Doing well No issues/complaints Afebrile/ No chills No erythema Minimal pain along medial aspect of wound Able to move without difficulty Normal sensation  He also mentions poison ivy along his bilateral forearms since his initial visit. Rash has spread to his abdomen. Has tried hydrocortisone cream with minimal relief.   Past Medical History  Diagnosis Date  . Anxiety   . Panic attacks   . Low back pain   . History of bilateral cataract extraction   . Hypothyroidism   . Depression   . Hyperlipidemia   . Panic attack   . Heart murmur   . Allergy   . Cataract      Home Meds: Prior to Admission medications   Medication Sig Start Date End Date Taking? Authorizing Provider  aspirin 81 MG tablet Take 81 mg by mouth daily.    Historical Provider, MD  clonazePAM Bobbye Charleston) 2 MG tablet TID as needed for anxiety 02/08/14   Janith Lima, MD  levothyroxine (SYNTHROID, LEVOTHROID) 150 MCG tablet TAKE 1 TABLET BY MOUTH ONCE DAILY 02/10/14   Janith Lima, MD  mometasone (NASONEX) 50 MCG/ACT nasal spray Place 2 sprays into the nose daily. 12/13/12   Janith Lima, MD  moxifloxacin (AVELOX) 400 MG tablet Take 1 tablet (400 mg total) by mouth daily. 03/05/14   Darlyne Russian, MD  Multiple Vitamin (MULTIVITAMIN) tablet Take 1 tablet by mouth daily.      Historical Provider, MD  oxyCODONE-acetaminophen (PERCOCET) 10-325 MG per tablet Take 1 tablet by mouth every 4 (four) hours as needed for pain. 02/22/14   Janith Lima, MD  pravastatin (PRAVACHOL) 40 MG tablet TAKE ONE TABLET BY MOUTH ONCE DAILY 06/30/13   Janith Lima, MD  tadalafil  (CIALIS) 20 MG tablet Take 1 tablet (20 mg total) by mouth daily as needed for erectile dysfunction. 07/28/12   Janith Lima, MD  traZODone (DESYREL) 100 MG tablet Take 1 tablet (100 mg total) by mouth at bedtime. 05/20/13   Janith Lima, MD    Allergies:  Allergies  Allergen Reactions  . Fluoxetine Hcl   . Venlafaxine     Physical Exam: Blood pressure 118/80, pulse 106, temperature 98.3 F (36.8 C), temperature source Oral, resp. rate 16, height 5' 7.5" (1.715 m), weight 220 lb (99.791 kg), SpO2 96.00%., Body mass index is 33.93 kg/(m^2). General: Well developed, well nourished, in no acute distress. Head: Normocephalic, atraumatic, sclera non-icteric, no xanthomas, nares are without discharge.  Neck: Supple. Lungs: Breathing is unlabored. Heart: Normal rate. Msk:  Strength and tone appear normal for age. Wound: Wound well healed without erythema, or swelling. Mild TTP along the medial aspect. FROM and 5/5 strength with normal sensation throughout including 2 point discrimination. No drainage.  Skin: See above, Mildly vesicular rash along the bilateral forearms, left greater than right. Rash along extends to the abdomen. No secondary infection.  Extremities: No clubbing or cyanosis. No edema. Neuro: Alert and oriented X 3. Moves all extremities spontaneously.  Psych:  Responds to questions appropriately with a normal affect.   PROCEDURE: Verbal consent obtained. 3 sutures removed without  difficulty.  Assessment and Plan: 48 y.o. male here for suture removal for wound described above and poison ivy  1) Suture removal -Sutures removed per above -Wound resolved -Bactroban 2% ointment Apply bid #15 grams no RF -RTC if with discomfort first of the week  2) Poison ivy -Prednisone 20 mg #12 3x2, 2x2, 1x2 no RF   Signed, Christell Faith, MHS, PA-C [facility] [place], [place] [id number] [facility] 03/17/2014 8:35 AM

## 2014-05-24 ENCOUNTER — Encounter: Payer: Self-pay | Admitting: Internal Medicine

## 2014-05-24 ENCOUNTER — Ambulatory Visit (INDEPENDENT_AMBULATORY_CARE_PROVIDER_SITE_OTHER): Payer: BC Managed Care – PPO | Admitting: Internal Medicine

## 2014-05-24 ENCOUNTER — Other Ambulatory Visit (INDEPENDENT_AMBULATORY_CARE_PROVIDER_SITE_OTHER): Payer: BC Managed Care – PPO

## 2014-05-24 VITALS — BP 130/80 | HR 70 | Temp 98.3°F | Resp 16 | Ht 67.0 in | Wt 202.0 lb

## 2014-05-24 DIAGNOSIS — E039 Hypothyroidism, unspecified: Secondary | ICD-10-CM

## 2014-05-24 DIAGNOSIS — J309 Allergic rhinitis, unspecified: Secondary | ICD-10-CM

## 2014-05-24 DIAGNOSIS — E785 Hyperlipidemia, unspecified: Secondary | ICD-10-CM

## 2014-05-24 DIAGNOSIS — G894 Chronic pain syndrome: Secondary | ICD-10-CM

## 2014-05-24 DIAGNOSIS — M5442 Lumbago with sciatica, left side: Secondary | ICD-10-CM

## 2014-05-24 DIAGNOSIS — M5430 Sciatica, unspecified side: Secondary | ICD-10-CM

## 2014-05-24 DIAGNOSIS — F410 Panic disorder [episodic paroxysmal anxiety] without agoraphobia: Secondary | ICD-10-CM

## 2014-05-24 DIAGNOSIS — F411 Generalized anxiety disorder: Secondary | ICD-10-CM

## 2014-05-24 LAB — LIPID PANEL
CHOL/HDL RATIO: 5
Cholesterol: 199 mg/dL (ref 0–200)
HDL: 39.6 mg/dL (ref >39.00)
LDL CALC: 125 mg/dL — AB (ref 0–99)
NONHDL: 159.4
Triglycerides: 173 mg/dL — ABNORMAL HIGH (ref 0.0–149.0)
VLDL: 34.6 mg/dL (ref 0.0–40.0)

## 2014-05-24 LAB — COMPREHENSIVE METABOLIC PANEL
ALBUMIN: 4.5 g/dL (ref 3.5–5.2)
ALT: 26 U/L (ref 0–53)
AST: 20 U/L (ref 0–37)
Alkaline Phosphatase: 56 U/L (ref 39–117)
BUN: 15 mg/dL (ref 6–23)
CALCIUM: 9.6 mg/dL (ref 8.4–10.5)
CHLORIDE: 99 meq/L (ref 96–112)
CO2: 24 mEq/L (ref 19–32)
Creatinine, Ser: 1 mg/dL (ref 0.4–1.5)
GFR: 82.82 mL/min (ref >60.00)
Glucose, Bld: 91 mg/dL (ref 70–99)
Potassium: 4.3 mEq/L (ref 3.5–5.1)
SODIUM: 134 meq/L — AB (ref 135–145)
Total Bilirubin: 1.2 mg/dL (ref 0.2–1.2)
Total Protein: 7.8 g/dL (ref 6.0–8.3)

## 2014-05-24 LAB — CBC WITH DIFFERENTIAL/PLATELET
BASOS PCT: 0.6 % (ref 0.0–3.0)
Basophils Absolute: 0 10*3/uL (ref 0.0–0.1)
EOS ABS: 0.2 10*3/uL (ref 0.0–0.7)
Eosinophils Relative: 2 % (ref 0.0–5.0)
HCT: 44.8 % (ref 39.0–52.0)
Hemoglobin: 14.9 g/dL (ref 13.0–17.0)
LYMPHS PCT: 32.7 % (ref 12.0–46.0)
Lymphs Abs: 2.5 10*3/uL (ref 0.7–4.0)
MCHC: 33.4 g/dL (ref 30.0–36.0)
MCV: 93.8 fl (ref 78.0–100.0)
Monocytes Absolute: 0.9 10*3/uL (ref 0.1–1.0)
Monocytes Relative: 11.5 % (ref 3.0–12.0)
Neutro Abs: 4 10*3/uL (ref 1.4–7.7)
Neutrophils Relative %: 53.2 % (ref 43.0–77.0)
PLATELETS: 273 10*3/uL (ref 150.0–400.0)
RBC: 4.77 Mil/uL (ref 4.22–5.81)
RDW: 13.1 % (ref 11.5–15.5)
WBC: 7.6 10*3/uL (ref 4.0–10.5)

## 2014-05-24 LAB — TSH: TSH: 3.7 u[IU]/mL (ref 0.35–4.50)

## 2014-05-24 MED ORDER — CLONAZEPAM 2 MG PO TABS: ORAL_TABLET | ORAL | Status: DC

## 2014-05-24 MED ORDER — OXYCODONE-ACETAMINOPHEN 10-325 MG PO TABS: 1.0000 | ORAL_TABLET | ORAL | Status: DC | PRN

## 2014-05-24 NOTE — Patient Instructions (Signed)
Back Pain, Adult Low back pain is very common. About 1 in 5 people have back pain.The cause of low back pain is rarely dangerous. The pain often gets better over time.About half of people with a sudden onset of back pain feel better in just 2 weeks. About 8 in 10 people feel better by 6 weeks.  CAUSES Some common causes of back pain include:  Strain of the muscles or ligaments supporting the spine.  Wear and tear (degeneration) of the spinal discs.  Arthritis.  Direct injury to the back. DIAGNOSIS Most of the time, the direct cause of low back pain is not known.However, back pain can be treated effectively even when the exact cause of the pain is unknown.Answering your caregiver's questions about your overall health and symptoms is one of the most accurate ways to make sure the cause of your pain is not dangerous. If your caregiver needs more information, he or she may order lab work or imaging tests (X-rays or MRIs).However, even if imaging tests show changes in your back, this usually does not require surgery. HOME CARE INSTRUCTIONS For many people, back pain returns.Since low back pain is rarely dangerous, it is often a condition that people can learn to manageon their own.   Remain active. It is stressful on the back to sit or stand in one place. Do not sit, drive, or stand in one place for more than 30 minutes at a time. Take short walks on level surfaces as soon as pain allows.Try to increase the length of time you walk each day.  Do not stay in bed.Resting more than 1 or 2 days can delay your recovery.  Do not avoid exercise or work.Your body is made to move.It is not dangerous to be active, even though your back may hurt.Your back will likely heal faster if you return to being active before your pain is gone.  Pay attention to your body when you bend and lift. Many people have less discomfortwhen lifting if they bend their knees, keep the load close to their bodies,and  avoid twisting. Often, the most comfortable positions are those that put less stress on your recovering back.  Find a comfortable position to sleep. Use a firm mattress and lie on your side with your knees slightly bent. If you lie on your back, put a pillow under your knees.  Only take over-the-counter or prescription medicines as directed by your caregiver. Over-the-counter medicines to reduce pain and inflammation are often the most helpful.Your caregiver may prescribe muscle relaxant drugs.These medicines help dull your pain so you can more quickly return to your normal activities and healthy exercise.  Put ice on the injured area.  Put ice in a plastic bag.  Place a towel between your skin and the bag.  Leave the ice on for 15-20 minutes, 03-04 times a day for the first 2 to 3 days. After that, ice and heat may be alternated to reduce pain and spasms.  Ask your caregiver about trying back exercises and gentle massage. This may be of some benefit.  Avoid feeling anxious or stressed.Stress increases muscle tension and can worsen back pain.It is important to recognize when you are anxious or stressed and learn ways to manage it.Exercise is a great option. SEEK MEDICAL CARE IF:  You have pain that is not relieved with rest or medicine.  You have pain that does not improve in 1 week.  You have new symptoms.  You are generally not feeling well. SEEK   IMMEDIATE MEDICAL CARE IF:   You have pain that radiates from your back into your legs.  You develop new bowel or bladder control problems.  You have unusual weakness or numbness in your arms or legs.  You develop nausea or vomiting.  You develop abdominal pain.  You feel faint. Document Released: 09/29/2005 Document Revised: 03/30/2012 Document Reviewed: 01/31/2014 Exit[employer] Patient Information 2015 [employer]. This information is not intended to replace advice given to you by your health care provider. Make sure you  discuss any questions you have with your health care provider.  

## 2014-05-24 NOTE — Progress Notes (Signed)
Subjective:    Patient ID: Jonathan Luna, male    DOB: 1965-11-18, 48 y.o.   MRN: [id number]  Thyroid Problem Presents for follow-up visit. Symptoms include anxiety, fatigue and weight loss (20# intentional weight loss). Patient reports no cold intolerance, constipation, depressed mood, diaphoresis, diarrhea, dry skin, hair loss, heat intolerance, hoarse voice, leg swelling, palpitations, tremors, visual change or weight gain. Past treatments include levothyroxine. The treatment provided moderate relief.      Review of Systems  Constitutional: Positive for weight loss (20# intentional weight loss) and fatigue. Negative for fever, chills, weight gain, diaphoresis, activity change, appetite change and unexpected weight change.  HENT: Negative.  Negative for hoarse voice.   Eyes: Negative.   Respiratory: Negative.  Negative for apnea, cough, choking, chest tightness, shortness of breath, wheezing and stridor.   Cardiovascular: Negative.  Negative for chest pain, palpitations and leg swelling.  Gastrointestinal: Negative.  Negative for vomiting, abdominal pain, diarrhea, constipation and blood in stool.  Endocrine: Negative.  Negative for cold intolerance and heat intolerance.  Genitourinary: Negative.   Musculoskeletal: Positive for back pain (he has chronic, unchanged low back pain that occasionally radiates into his LLE). Negative for arthralgias, joint swelling, myalgias, neck pain and neck stiffness.  Skin: Negative.  Negative for rash.  Allergic/Immunologic: Negative.   Neurological: Negative.  Negative for tremors.  Hematological: Negative.  Negative for adenopathy. Does not bruise/bleed easily.  Psychiatric/Behavioral: Negative.        Objective:   Physical Exam  Vitals reviewed. Constitutional: He is oriented to person, place, and time. He appears well-developed and well-nourished. No distress.  HENT:  Head: Normocephalic and atraumatic.  Mouth/Throat: Oropharynx is clear and  moist. No oropharyngeal exudate.  Eyes: Conjunctivae are normal. Right eye exhibits no discharge. Left eye exhibits no discharge. No scleral icterus.  Neck: Normal range of motion. Neck supple. No JVD present. No tracheal deviation present. No thyromegaly present.  Cardiovascular: Normal rate, regular rhythm, normal heart sounds and intact distal pulses.  Exam reveals no gallop and no friction rub.   No murmur heard. Pulmonary/Chest: Effort normal and breath sounds normal. No stridor. No respiratory distress. He has no wheezes. He has no rales. He exhibits no tenderness.  Abdominal: Soft. Bowel sounds are normal. He exhibits no distension and no mass. There is no tenderness. There is no rebound and no guarding.  Musculoskeletal: Normal range of motion. He exhibits no edema and no tenderness.  Lymphadenopathy:    He has no cervical adenopathy.  Neurological: He is alert and oriented to person, place, and time. He has normal strength. He displays no atrophy and no tremor. No cranial nerve deficit or sensory deficit. He exhibits normal muscle tone. He displays a negative Romberg sign. He displays no seizure activity. Coordination and gait normal.  Neg SLR in BLE  Skin: Skin is warm and dry. No rash noted. He is not diaphoretic. No erythema. No pallor.  Psychiatric: He has a normal mood and affect. His behavior is normal. Judgment and thought content normal.     Lab Results  Component Value Date   WBC 7.7 07/28/2012   HGB 13.3 07/28/2012   HCT 40.0 07/28/2012   PLT 275.0 07/28/2012   GLUCOSE 89 07/28/2012   CHOL 150 05/20/2013   TRIG 60.0 05/20/2013   HDL 39.30 05/20/2013   LDLDIRECT 156.1 07/08/2007   LDLCALC 99 05/20/2013   ALT 19 07/28/2012   AST 19 07/28/2012   NA 139 07/28/2012   K 4.9  07/28/2012   CL 107 07/28/2012   CREATININE 0.9 07/28/2012   BUN 17 07/28/2012   CO2 27 07/28/2012   TSH 1.06 11/21/2013   PSA 0.85 07/28/2012   HGBA1C 5.2 07/28/2012       Assessment & Plan:

## 2014-05-24 NOTE — Progress Notes (Signed)
Pre visit review using our clinic review tool, if applicable. No additional management support is needed unless otherwise documented below in the visit note. 

## 2014-05-25 ENCOUNTER — Encounter: Payer: Self-pay | Admitting: Internal Medicine

## 2014-05-27 ENCOUNTER — Encounter: Payer: Self-pay | Admitting: Internal Medicine

## 2014-05-27 NOTE — Assessment & Plan Note (Signed)
His TSH is on the normal range He will stay on the current dose 

## 2014-05-27 NOTE — Assessment & Plan Note (Signed)
There is no clinical change in his low back pain There are no warning s/s He is getting relief with percocet Will cont the same for now

## 2014-05-27 NOTE — Assessment & Plan Note (Signed)
Will cont klonopin as needed 

## 2014-07-26 ENCOUNTER — Ambulatory Visit (INDEPENDENT_AMBULATORY_CARE_PROVIDER_SITE_OTHER): Payer: BC Managed Care – PPO | Admitting: Emergency Medicine

## 2014-07-26 VITALS — BP 118/68 | HR 111 | Temp 98.1°F | Resp 18 | Ht 68.0 in | Wt 203.0 lb

## 2014-07-26 DIAGNOSIS — J209 Acute bronchitis, unspecified: Secondary | ICD-10-CM

## 2014-07-26 DIAGNOSIS — J018 Other acute sinusitis: Secondary | ICD-10-CM

## 2014-07-26 MED ORDER — AMOXICILLIN-POT CLAVULANATE 875-125 MG PO TABS: 1.0000 | ORAL_TABLET | Freq: Two times a day (BID) | ORAL | Status: DC

## 2014-07-26 MED ORDER — HYDROCOD POLST-CHLORPHEN POLST 10-8 MG/5ML PO LQCR: 5.0000 mL | Freq: Two times a day (BID) | ORAL | Status: DC | PRN

## 2014-07-26 MED ORDER — PSEUDOEPHEDRINE-GUAIFENESIN ER 60-600 MG PO TB12: 1.0000 | ORAL_TABLET | Freq: Two times a day (BID) | ORAL | Status: DC

## 2014-07-26 NOTE — Progress Notes (Signed)
[facility] [address] [place] [address] [phone]  Date:  07/26/2014   Name:  Jonathan Luna   DOB:  Jan 01, 1966   MRN:  [id number]  PCP:  Scarlette Calico, MD    Chief Complaint: Sinus Problem, Nasal Congestion and Cough   History of Present Illness:  Jonathan Luna is a 48 y.o. very pleasant male patient who presents with the following:  Ill this week with nasal congestion and post nasal drainage that is purulent in nature.  No fever or chills Has a cough productive of purulent sputum.  No wheezing or shortness of breath No nausea or vomiting no stool change or rash. History of prior sinusitis No improvement with over the counter medications or other home remedies.  Denies other complaint or health concern today.   Patient Active Problem List   Diagnosis Date Noted  . Eczema, dyshidrotic 11/21/2013  . Allergic rhinitis, cause unspecified 12/13/2012  . Chronic pain disorder 09/29/2012  . ED (erectile dysfunction) 07/28/2012  . Routine general medical examination at a health care facility 10/08/2011  . Irritable bowel syndrome 10/16/2010  . BACK PAIN, LUMBAR, CHRONIC 04/09/2010  . HYPOTHYROIDISM 05/03/2009  . Hyperlipidemia with target LDL less than 130 05/03/2009  . PANIC DISORDER 05/03/2009  . DEPRESSION 08/23/2008  . ANXIETY 04/29/2007    Past Medical History  Diagnosis Date  . Anxiety   . Panic attacks   . Low back pain   . History of bilateral cataract extraction   . Hypothyroidism   . Depression   . Hyperlipidemia   . Panic attack   . Heart murmur   . Allergy   . Cataract     Past Surgical History  Procedure Laterality Date  . Cataract extraction    . Eye surgery      History  Substance Use Topics  . Smoking status: Never Smoker   . Smokeless tobacco: Never Used  . Alcohol Use: 1.2 oz/week    2 Cans of beer per week    Family History  Problem Relation Age of Onset  . Hypertension Father   . Breast cancer Mother      Allergies  Allergen Reactions  . Fluoxetine Hcl   . Venlafaxine     Medication list has been reviewed and updated.  Current Outpatient Prescriptions on File Prior to Visit  Medication Sig Dispense Refill  . aspirin 81 MG tablet Take 81 mg by mouth daily.      . clonazePAM (KLONOPIN) 2 MG tablet TID as needed for anxiety  90 tablet  5  . levothyroxine (SYNTHROID, LEVOTHROID) 150 MCG tablet TAKE 1 TABLET BY MOUTH ONCE DAILY  90 tablet  1  . Multiple Vitamin (MULTIVITAMIN) tablet Take 1 tablet by mouth daily.        Marland Kitchen oxyCODONE-acetaminophen (PERCOCET) 10-325 MG per tablet Take 1 tablet by mouth every 4 (four) hours as needed for pain.  100 tablet  0  . mometasone (NASONEX) 50 MCG/ACT nasal spray Place 2 sprays into the nose daily.  17 g  12  . pravastatin (PRAVACHOL) 40 MG tablet TAKE ONE TABLET BY MOUTH ONCE DAILY  90 tablet  3  . traZODone (DESYREL) 100 MG tablet Take 1 tablet (100 mg total) by mouth at bedtime.  90 tablet  3   No current facility-administered medications on file prior to visit.    Review of Systems:  As per HPI, otherwise negative.    Physical Examination: Filed Vitals:  07/26/14 1907  BP: 118/68  Pulse: 111  Temp: 98.1 F (36.7 C)  Resp: 18   Filed Vitals:   07/26/14 1907  Height: 5\' 8"  (1.727 m)  Weight: 203 lb (92.08 kg)   Body mass index is 30.87 kg/(m^2). Ideal Body Weight: Weight in (lb) to have BMI = 25: 164.1  GEN: WDWN, NAD, Non-toxic, A & O x 3 HEENT: Atraumatic, Normocephalic. Neck supple. No masses, No LAD. Ears and Nose: No external deformity. CV: RRR, No M/G/R. No JVD. No thrill. No extra heart sounds. PULM: CTA B, no wheezes, crackles, rhonchi. No retractions. No resp. distress. No accessory muscle use. ABD: S, NT, ND, +BS. No rebound. No HSM. EXTR: No c/c/e NEURO Normal gait.  PSYCH: Normally interactive. Conversant. Not depressed or anxious appearing.  Calm demeanor.     Assessment and Plan: Sinusitis   Bronchitis   Signed,  Ellison Carwin, MD

## 2014-07-26 NOTE — Patient Instructions (Signed)

## 2014-08-21 ENCOUNTER — Other Ambulatory Visit: Payer: Self-pay | Admitting: Internal Medicine

## 2014-08-22 ENCOUNTER — Telehealth: Payer: Self-pay | Admitting: Internal Medicine

## 2014-08-22 ENCOUNTER — Telehealth: Payer: Self-pay

## 2014-08-22 DIAGNOSIS — F411 Generalized anxiety disorder: Secondary | ICD-10-CM

## 2014-08-22 DIAGNOSIS — F410 Panic disorder [episodic paroxysmal anxiety] without agoraphobia: Secondary | ICD-10-CM

## 2014-08-22 MED ORDER — CLONAZEPAM 2 MG PO TABS: ORAL_TABLET | ORAL | Status: DC

## 2014-08-22 NOTE — Telephone Encounter (Signed)
Pt called requesting a return phone call  See previous note

## 2014-08-22 NOTE — Telephone Encounter (Signed)
ok 

## 2014-08-22 NOTE — Telephone Encounter (Signed)
Please advise if ok to refill clonazepam 2 mg. Patient last seen and medication last filled 8.12.15 #[id number]. Thanks

## 2014-08-22 NOTE — Telephone Encounter (Signed)
Patient would like for you to call him concerning his medications. Please Advise

## 2014-08-22 NOTE — Telephone Encounter (Signed)
RX faxed to pharmacy.

## 2014-08-23 ENCOUNTER — Encounter: Payer: Self-pay | Admitting: Internal Medicine

## 2014-08-23 ENCOUNTER — Ambulatory Visit (INDEPENDENT_AMBULATORY_CARE_PROVIDER_SITE_OTHER): Payer: BC Managed Care – PPO | Admitting: Internal Medicine

## 2014-08-23 VITALS — BP 124/80 | HR 81 | Temp 97.9°F | Resp 16 | Ht 68.0 in | Wt 209.0 lb

## 2014-08-23 DIAGNOSIS — G894 Chronic pain syndrome: Secondary | ICD-10-CM

## 2014-08-23 DIAGNOSIS — M5442 Lumbago with sciatica, left side: Secondary | ICD-10-CM

## 2014-08-23 DIAGNOSIS — E038 Other specified hypothyroidism: Secondary | ICD-10-CM

## 2014-08-23 MED ORDER — OXYCODONE-ACETAMINOPHEN 10-325 MG PO TABS: 1.0000 | ORAL_TABLET | ORAL | Status: DC | PRN

## 2014-08-23 NOTE — Progress Notes (Signed)
Pre visit review using our clinic review tool, if applicable. No additional management support is needed unless otherwise documented below in the visit note. 

## 2014-08-23 NOTE — Assessment & Plan Note (Signed)
He will cont percocet as needed for pain

## 2014-08-23 NOTE — Patient Instructions (Signed)
Hypothyroidism The thyroid is a large gland located in the lower front of your neck. The thyroid gland helps control metabolism. Metabolism is how your body handles food. It controls metabolism with the hormone thyroxine. When this gland is underactive (hypothyroid), it produces too little hormone.  CAUSES These include:   Absence or destruction of thyroid tissue.  Goiter due to iodine deficiency.  Goiter due to medications.  Congenital defects (since birth).  Problems with the pituitary. This causes a lack of TSH (thyroid stimulating hormone). This hormone tells the thyroid to turn out more hormone. SYMPTOMS  Lethargy (feeling as though you have no energy)  Cold intolerance  Weight gain (in spite of normal food intake)  Dry skin  Coarse hair  Menstrual irregularity (if severe, may lead to infertility)  Slowing of thought processes Cardiac problems are also caused by insufficient amounts of thyroid hormone. Hypothyroidism in the newborn is cretinism, and is an extreme form. It is important that this form be treated adequately and immediately or it will lead rapidly to retarded physical and mental development. DIAGNOSIS  To prove hypothyroidism, your caregiver may do blood tests and ultrasound tests. Sometimes the signs are hidden. It may be necessary for your caregiver to watch this illness with blood tests either before or after diagnosis and treatment. TREATMENT  Low levels of thyroid hormone are increased by using synthetic thyroid hormone. This is a safe, effective treatment. It usually takes about four weeks to gain the full effects of the medication. After you have the full effect of the medication, it will generally take another four weeks for problems to leave. Your caregiver may start you on low doses. If you have had heart problems the dose may be gradually increased. It is generally not an emergency to get rapidly to normal. HOME CARE INSTRUCTIONS   Take your  medications as your caregiver suggests. Let your caregiver know of any medications you are taking or start taking. Your caregiver will help you with dosage schedules.  As your condition improves, your dosage needs may increase. It will be necessary to have continuing blood tests as suggested by your caregiver.  Report all suspected medication side effects to your caregiver. SEEK MEDICAL CARE IF: Seek medical care if you develop:  Sweating.  Tremulousness (tremors).  Anxiety.  Rapid weight loss.  Heat intolerance.  Emotional swings.  Diarrhea.  Weakness. SEEK IMMEDIATE MEDICAL CARE IF:  You develop chest pain, an irregular heart beat (palpitations), or a rapid heart beat. MAKE SURE YOU:   Understand these instructions.  Will watch your condition.  Will get help right away if you are not doing well or get worse. Document Released: 09/29/2005 Document Revised: 12/22/2011 Document Reviewed: 05/19/2008 [employer] Patient Information 2015 [employer]. This information is not intended to replace advice given to you by your health care provider. Make sure you discuss any questions you have with your health care provider.  

## 2014-08-23 NOTE — Progress Notes (Signed)
Subjective:    Patient ID: Jonathan Luna, male    DOB: 27-Feb-1966, 48 y.o.   MRN: [id number]  Back Pain This is a chronic problem. The current episode started more than 1 year ago. The problem occurs intermittently. The problem is unchanged. The pain is present in the lumbar spine. The quality of the pain is described as aching and burning. The pain does not radiate. The pain is at a severity of 5/10. The pain is moderate. The pain is worse during the day. The symptoms are aggravated by position and bending. Pertinent negatives include no abdominal pain, bladder incontinence, bowel incontinence, chest pain, dysuria, fever, headaches, leg pain, numbness, paresis, paresthesias, pelvic pain, perianal numbness, tingling, weakness or weight loss. He has tried analgesics for the symptoms. The treatment provided significant relief.      Review of Systems  Constitutional: Negative.  Negative for fever, chills, weight loss, diaphoresis, appetite change and fatigue.  HENT: Negative.   Eyes: Negative.   Respiratory: Negative.  Negative for cough, choking, chest tightness, shortness of breath and stridor.   Cardiovascular: Negative.  Negative for chest pain, palpitations and leg swelling.  Gastrointestinal: Negative.  Negative for nausea, vomiting, abdominal pain, diarrhea, constipation, blood in stool and bowel incontinence.  Endocrine: Negative.   Genitourinary: Negative.  Negative for bladder incontinence, dysuria and pelvic pain.  Musculoskeletal: Positive for back pain. Negative for myalgias, joint swelling, arthralgias, gait problem, neck pain and neck stiffness.  Skin: Negative.   Allergic/Immunologic: Negative.   Neurological: Negative.  Negative for tingling, weakness, numbness, headaches and paresthesias.  Hematological: Negative.  Negative for adenopathy. Does not bruise/bleed easily.  Psychiatric/Behavioral: Positive for sleep disturbance. Negative for suicidal ideas, hallucinations,  behavioral problems, confusion, self-injury, dysphoric mood, decreased concentration and agitation. The patient is nervous/anxious. The patient is not hyperactive.        Objective:   Physical Exam  Constitutional: He is oriented to person, place, and time. He appears well-developed and well-nourished. No distress.  HENT:  Head: Normocephalic and atraumatic.  Mouth/Throat: Oropharynx is clear and moist. No oropharyngeal exudate.  Eyes: Conjunctivae are normal. Right eye exhibits no discharge. Left eye exhibits no discharge. No scleral icterus.  Neck: Normal range of motion. Neck supple. No JVD present. No tracheal deviation present. No thyromegaly present.  Cardiovascular: Normal rate, regular rhythm, normal heart sounds and intact distal pulses.  Exam reveals no gallop and no friction rub.   No murmur heard. Pulmonary/Chest: Effort normal and breath sounds normal. No stridor. No respiratory distress. He has no wheezes. He has no rales. He exhibits no tenderness.  Abdominal: Soft. Bowel sounds are normal. He exhibits no distension and no mass. There is no tenderness. There is no rebound and no guarding.  Musculoskeletal: Normal range of motion. He exhibits no edema or tenderness.  Lymphadenopathy:    He has no cervical adenopathy.  Neurological: He is oriented to person, place, and time.  Skin: Skin is warm and dry. No rash noted. He is not diaphoretic. No erythema. No pallor.  Psychiatric: He has a normal mood and affect. His behavior is normal. Judgment and thought content normal.  Vitals reviewed.    Lab Results  Component Value Date   WBC 7.6 05/24/2014   HGB 14.9 05/24/2014   HCT 44.8 05/24/2014   PLT 273.0 05/24/2014   GLUCOSE 91 05/24/2014   CHOL 199 05/24/2014   TRIG 173.0* 05/24/2014   HDL 39.60 05/24/2014   LDLDIRECT 156.1 07/08/2007   LDLCALC 125*  05/24/2014   ALT 26 05/24/2014   AST 20 05/24/2014   NA 134* 05/24/2014   K 4.3 05/24/2014   CL 99 05/24/2014    CREATININE 1.0 05/24/2014   BUN 15 05/24/2014   CO2 24 05/24/2014   TSH 3.70 05/24/2014   PSA 0.85 07/28/2012   HGBA1C 5.2 07/28/2012       Assessment & Plan:

## 2014-08-23 NOTE — Progress Notes (Signed)
   Subjective:    Patient ID: Jonathan Luna, male    DOB: 12-12-1965, 48 y.o.   MRN: [id number]  Thyroid Problem Presents for follow-up visit.      Review of Systems     Objective:   Physical Exam   Lab Results  Component Value Date   WBC 7.6 05/24/2014   HGB 14.9 05/24/2014   HCT 44.8 05/24/2014   PLT 273.0 05/24/2014   GLUCOSE 91 05/24/2014   CHOL 199 05/24/2014   TRIG 173.0* 05/24/2014   HDL 39.60 05/24/2014   LDLDIRECT 156.1 07/08/2007   LDLCALC 125* 05/24/2014   ALT 26 05/24/2014   AST 20 05/24/2014   NA 134* 05/24/2014   K 4.3 05/24/2014   CL 99 05/24/2014   CREATININE 1.0 05/24/2014   BUN 15 05/24/2014   CO2 24 05/24/2014   TSH 3.70 05/24/2014   PSA 0.85 07/28/2012   HGBA1C 5.2 07/28/2012       Assessment & Plan:

## 2014-08-23 NOTE — Assessment & Plan Note (Signed)
His last TSH was in the normal range He will stay on the current dose

## 2014-10-20 ENCOUNTER — Telehealth: Payer: Self-pay | Admitting: Internal Medicine

## 2014-10-20 NOTE — Telephone Encounter (Signed)
°  Needs directions for Clonazapam, is it twice dailyr or three times daily?  Chloe [email or web address]  [phone]

## 2014-10-23 ENCOUNTER — Other Ambulatory Visit: Payer: Self-pay | Admitting: Internal Medicine

## 2014-10-23 ENCOUNTER — Encounter: Payer: Self-pay | Admitting: Internal Medicine

## 2014-10-23 ENCOUNTER — Ambulatory Visit (INDEPENDENT_AMBULATORY_CARE_PROVIDER_SITE_OTHER): Payer: BLUE CROSS/BLUE SHIELD | Admitting: Internal Medicine

## 2014-10-23 ENCOUNTER — Other Ambulatory Visit (INDEPENDENT_AMBULATORY_CARE_PROVIDER_SITE_OTHER): Payer: BLUE CROSS/BLUE SHIELD

## 2014-10-23 VITALS — BP 138/90 | HR 86 | Temp 97.6°F | Resp 16 | Wt 212.0 lb

## 2014-10-23 DIAGNOSIS — F410 Panic disorder [episodic paroxysmal anxiety] without agoraphobia: Secondary | ICD-10-CM

## 2014-10-23 DIAGNOSIS — F411 Generalized anxiety disorder: Secondary | ICD-10-CM

## 2014-10-23 DIAGNOSIS — J0120 Acute ethmoidal sinusitis, unspecified: Secondary | ICD-10-CM

## 2014-10-23 DIAGNOSIS — J322 Chronic ethmoidal sinusitis: Secondary | ICD-10-CM | POA: Insufficient documentation

## 2014-10-23 DIAGNOSIS — J301 Allergic rhinitis due to pollen: Secondary | ICD-10-CM

## 2014-10-23 DIAGNOSIS — E038 Other specified hypothyroidism: Secondary | ICD-10-CM

## 2014-10-23 DIAGNOSIS — G894 Chronic pain syndrome: Secondary | ICD-10-CM

## 2014-10-23 DIAGNOSIS — M5442 Lumbago with sciatica, left side: Secondary | ICD-10-CM

## 2014-10-23 DIAGNOSIS — N528 Other male erectile dysfunction: Secondary | ICD-10-CM

## 2014-10-23 LAB — TSH: TSH: 10.01 u[IU]/mL — AB (ref 0.35–4.50)

## 2014-10-23 MED ORDER — OXYCODONE-ACETAMINOPHEN 10-325 MG PO TABS: 1.0000 | ORAL_TABLET | ORAL | Status: DC | PRN

## 2014-10-23 MED ORDER — PRAVASTATIN SODIUM 40 MG PO TABS: 40.0000 mg | ORAL_TABLET | Freq: Every day | ORAL | Status: DC

## 2014-10-23 MED ORDER — AMOXICILLIN 875 MG PO TABS: 875.0000 mg | ORAL_TABLET | Freq: Two times a day (BID) | ORAL | Status: DC

## 2014-10-23 MED ORDER — LEVOTHYROXINE SODIUM 175 MCG PO TABS
175.0000 ug | ORAL_TABLET | Freq: Every day | ORAL | Status: DC
Start: 2014-10-23 — End: 2015-02-16

## 2014-10-23 NOTE — Progress Notes (Signed)
Pre visit review using our clinic review tool, if applicable. No additional management support is needed unless otherwise documented below in the visit note. 

## 2014-10-23 NOTE — Assessment & Plan Note (Signed)
I will treat the infection with amoxil

## 2014-10-23 NOTE — Assessment & Plan Note (Signed)
I will recheck his TSH and will adjust his dose if needed 

## 2014-10-23 NOTE — Progress Notes (Signed)
Subjective:    Patient ID: Jonathan Luna, male    DOB: [birth year], 49 y.o.   MRN: [id number]  Back Pain This is a recurrent problem. The current episode started more than 1 year ago. The problem occurs intermittently. The problem is unchanged. The pain is present in the lumbar spine. The quality of the pain is described as aching and shooting. The pain radiates to the left thigh. The pain is at a severity of 6/10. The pain is moderate. The pain is worse during the day. The symptoms are aggravated by bending, position and standing. Pertinent negatives include no abdominal pain, bladder incontinence, bowel incontinence, chest pain, dysuria, fever, headaches, leg pain, numbness, paresis, paresthesias, pelvic pain, perianal numbness, tingling, weakness or weight loss. He has tried analgesics for the symptoms. The treatment provided significant relief.      Review of Systems  Constitutional: Negative.  Negative for fever, chills, weight loss, diaphoresis, appetite change and fatigue.  HENT: Positive for congestion, postnasal drip, rhinorrhea, sinus pressure and sneezing. Negative for facial swelling, nosebleeds, sore throat, tinnitus and voice change.   Eyes: Negative.   Respiratory: Negative.   Cardiovascular: Negative.  Negative for chest pain, palpitations and leg swelling.  Gastrointestinal: Negative.  Negative for abdominal pain and bowel incontinence.  Endocrine: Negative.   Genitourinary: Negative.  Negative for bladder incontinence, dysuria and pelvic pain.  Musculoskeletal: Positive for back pain. Negative for myalgias, joint swelling, arthralgias, gait problem, neck pain and neck stiffness.  Skin: Negative.  Negative for rash.  Allergic/Immunologic: Negative.   Neurological: Negative.  Negative for tingling, weakness, numbness, headaches and paresthesias.  Hematological: Negative.  Negative for adenopathy. Does not bruise/bleed easily.  Psychiatric/Behavioral: Negative.          Objective:   Physical Exam  Constitutional: He is oriented to person, place, and time. He appears well-developed and well-nourished.  Non-toxic appearance. He does not have a sickly appearance. He does not appear ill. No distress.  HENT:  Head: Normocephalic and atraumatic.  Right Ear: Hearing, tympanic membrane, external ear and ear canal normal.  Left Ear: Hearing, tympanic membrane, external ear and ear canal normal.  Nose: No mucosal edema, rhinorrhea or sinus tenderness. Right sinus exhibits no maxillary sinus tenderness and no frontal sinus tenderness. Left sinus exhibits no maxillary sinus tenderness and no frontal sinus tenderness.  Mouth/Throat: Oropharynx is clear and moist and mucous membranes are normal. Mucous membranes are not pale, not dry and not cyanotic. No oral lesions. No trismus in the jaw. No uvula swelling. No oropharyngeal exudate, posterior oropharyngeal edema, posterior oropharyngeal erythema or tonsillar abscesses.  Eyes: Conjunctivae are normal. Right eye exhibits no discharge. Left eye exhibits no discharge. No scleral icterus.  Neck: Normal range of motion. Neck supple. No JVD present. No tracheal deviation present. No thyromegaly present.  Cardiovascular: Normal rate, regular rhythm, normal heart sounds and intact distal pulses.  Exam reveals no gallop and no friction rub.   No murmur heard. Pulmonary/Chest: Effort normal and breath sounds normal. No stridor. No respiratory distress. He has no wheezes. He has no rales. He exhibits no tenderness.  Abdominal: Soft. Bowel sounds are normal. He exhibits no distension and no mass. There is no tenderness. There is no rebound and no guarding.  Musculoskeletal: Normal range of motion. He exhibits no edema or tenderness.  Lymphadenopathy:    He has no cervical adenopathy.  Neurological: He is oriented to person, place, and time.  Skin: Skin is warm and dry. No rash  noted. He is not diaphoretic. No erythema. No pallor.   Psychiatric: He has a normal mood and affect. His behavior is normal. Judgment and thought content normal.  Vitals reviewed.    Lab Results  Component Value Date   WBC 7.6 05/24/2014   HGB 14.9 05/24/2014   HCT 44.8 05/24/2014   PLT 273.0 05/24/2014   GLUCOSE 91 05/24/2014   CHOL 199 05/24/2014   TRIG 173.0* 05/24/2014   HDL 39.60 05/24/2014   LDLDIRECT 156.1 07/08/2007   LDLCALC 125* 05/24/2014   ALT 26 05/24/2014   AST 20 05/24/2014   NA 134* 05/24/2014   K 4.3 05/24/2014   CL 99 05/24/2014   CREATININE 1.0 05/24/2014   BUN 15 05/24/2014   CO2 24 05/24/2014   TSH 3.70 05/24/2014   PSA 0.85 07/28/2012   HGBA1C 5.2 07/28/2012       Assessment & Plan:

## 2014-10-23 NOTE — Assessment & Plan Note (Signed)
Will cont percocet as needed for pain

## 2014-10-23 NOTE — Patient Instructions (Signed)
Back Pain, Adult Low back pain is very common. About 1 in 5 people have back pain.The cause of low back pain is rarely dangerous. The pain often gets better over time.About half of people with a sudden onset of back pain feel better in just 2 weeks. About 8 in 10 people feel better by 6 weeks.  CAUSES Some common causes of back pain include:  Strain of the muscles or ligaments supporting the spine.  Wear and tear (degeneration) of the spinal discs.  Arthritis.  Direct injury to the back. DIAGNOSIS Most of the time, the direct cause of low back pain is not known.However, back pain can be treated effectively even when the exact cause of the pain is unknown.Answering your caregiver's questions about your overall health and symptoms is one of the most accurate ways to make sure the cause of your pain is not dangerous. If your caregiver needs more information, he or she may order lab work or imaging tests (X-rays or MRIs).However, even if imaging tests show changes in your back, this usually does not require surgery. HOME CARE INSTRUCTIONS For many people, back pain returns.Since low back pain is rarely dangerous, it is often a condition that people can learn to manageon their own.   Remain active. It is stressful on the back to sit or stand in one place. Do not sit, drive, or stand in one place for more than 30 minutes at a time. Take short walks on level surfaces as soon as pain allows.Try to increase the length of time you walk each day.  Do not stay in bed.Resting more than 1 or 2 days can delay your recovery.  Do not avoid exercise or work.Your body is made to move.It is not dangerous to be active, even though your back may hurt.Your back will likely heal faster if you return to being active before your pain is gone.  Pay attention to your body when you bend and lift. Many people have less discomfortwhen lifting if they bend their knees, keep the load close to their bodies,and  avoid twisting. Often, the most comfortable positions are those that put less stress on your recovering back.  Find a comfortable position to sleep. Use a firm mattress and lie on your side with your knees slightly bent. If you lie on your back, put a pillow under your knees.  Only take over-the-counter or prescription medicines as directed by your caregiver. Over-the-counter medicines to reduce pain and inflammation are often the most helpful.Your caregiver may prescribe muscle relaxant drugs.These medicines help dull your pain so you can more quickly return to your normal activities and healthy exercise.  Put ice on the injured area.  Put ice in a plastic bag.  Place a towel between your skin and the bag.  Leave the ice on for 15-20 minutes, 03-04 times a day for the first 2 to 3 days. After that, ice and heat may be alternated to reduce pain and spasms.  Ask your caregiver about trying back exercises and gentle massage. This may be of some benefit.  Avoid feeling anxious or stressed.Stress increases muscle tension and can worsen back pain.It is important to recognize when you are anxious or stressed and learn ways to manage it.Exercise is a great option. SEEK MEDICAL CARE IF:  You have pain that is not relieved with rest or medicine.  You have pain that does not improve in 1 week.  You have new symptoms.  You are generally not feeling well. SEEK   IMMEDIATE MEDICAL CARE IF:   You have pain that radiates from your back into your legs.  You develop new bowel or bladder control problems.  You have unusual weakness or numbness in your arms or legs.  You develop nausea or vomiting.  You develop abdominal pain.  You feel faint. Document Released: 09/29/2005 Document Revised: 03/30/2012 Document Reviewed: 01/31/2014 Exit[employer] Patient Information 2015 [employer]. This information is not intended to replace advice given to you by your health care provider. Make sure you  discuss any questions you have with your health care provider.  

## 2014-12-27 ENCOUNTER — Other Ambulatory Visit: Payer: Self-pay | Admitting: Internal Medicine

## 2015-02-15 ENCOUNTER — Ambulatory Visit (INDEPENDENT_AMBULATORY_CARE_PROVIDER_SITE_OTHER): Payer: BLUE CROSS/BLUE SHIELD | Admitting: Internal Medicine

## 2015-02-15 ENCOUNTER — Other Ambulatory Visit (INDEPENDENT_AMBULATORY_CARE_PROVIDER_SITE_OTHER): Payer: BLUE CROSS/BLUE SHIELD

## 2015-02-15 ENCOUNTER — Encounter: Payer: Self-pay | Admitting: Internal Medicine

## 2015-02-15 VITALS — BP 118/72 | HR 77 | Temp 97.9°F | Resp 16 | Ht 68.0 in | Wt 212.0 lb

## 2015-02-15 DIAGNOSIS — M545 Low back pain: Secondary | ICD-10-CM

## 2015-02-15 DIAGNOSIS — F410 Panic disorder [episodic paroxysmal anxiety] without agoraphobia: Secondary | ICD-10-CM

## 2015-02-15 DIAGNOSIS — J301 Allergic rhinitis due to pollen: Secondary | ICD-10-CM

## 2015-02-15 DIAGNOSIS — E038 Other specified hypothyroidism: Secondary | ICD-10-CM

## 2015-02-15 DIAGNOSIS — G894 Chronic pain syndrome: Secondary | ICD-10-CM

## 2015-02-15 DIAGNOSIS — F411 Generalized anxiety disorder: Secondary | ICD-10-CM | POA: Diagnosis not present

## 2015-02-15 DIAGNOSIS — R748 Abnormal levels of other serum enzymes: Secondary | ICD-10-CM

## 2015-02-15 DIAGNOSIS — E785 Hyperlipidemia, unspecified: Secondary | ICD-10-CM | POA: Diagnosis not present

## 2015-02-15 DIAGNOSIS — N528 Other male erectile dysfunction: Secondary | ICD-10-CM

## 2015-02-15 DIAGNOSIS — M5442 Lumbago with sciatica, left side: Secondary | ICD-10-CM

## 2015-02-15 LAB — TSH: TSH: 5.3 u[IU]/mL — ABNORMAL HIGH (ref 0.35–4.50)

## 2015-02-15 MED ORDER — OXYCODONE-ACETAMINOPHEN 10-325 MG PO TABS: 1.0000 | ORAL_TABLET | ORAL | Status: DC | PRN

## 2015-02-15 MED ORDER — CLONAZEPAM 2 MG PO TABS: ORAL_TABLET | ORAL | Status: DC

## 2015-02-15 NOTE — Progress Notes (Signed)
Pre visit review using our clinic review tool, if applicable. No additional management support is needed unless otherwise documented below in the visit note. 

## 2015-02-15 NOTE — Progress Notes (Signed)
   Subjective:    Patient ID: Jonathan Luna, male    DOB: 1966-07-30, 49 y.o.   MRN: [id number]  Thyroid Problem Presents for follow-up visit. Symptoms include anxiety. Patient reports no cold intolerance, constipation, depressed mood, diaphoresis, diarrhea, dry skin, fatigue, hair loss, heat intolerance, hoarse voice, leg swelling, nail problem, palpitations, tremors, visual change, weight gain or weight loss. The symptoms have been improving. Past treatments include levothyroxine. The treatment provided moderate relief.      Review of Systems  Constitutional: Negative.  Negative for chills, weight loss, weight gain, diaphoresis, appetite change and fatigue.  HENT: Negative.  Negative for hoarse voice.   Eyes: Negative.   Respiratory: Negative.  Negative for cough, choking, chest tightness, shortness of breath and stridor.   Cardiovascular: Negative.  Negative for chest pain, palpitations and leg swelling.  Gastrointestinal: Negative.  Negative for nausea, abdominal pain, diarrhea and constipation.  Endocrine: Negative.  Negative for cold intolerance and heat intolerance.  Genitourinary: Negative.  Negative for dysuria, hematuria and difficulty urinating.  Musculoskeletal: Positive for back pain. Negative for arthralgias and neck pain.  Skin: Negative.  Negative for color change, pallor and rash.  Allergic/Immunologic: Negative.   Neurological: Negative.  Negative for dizziness and tremors.  Hematological: Negative.  Negative for adenopathy. Does not bruise/bleed easily.  Psychiatric/Behavioral: Positive for sleep disturbance. Negative for suicidal ideas, confusion, self-injury, dysphoric mood, decreased concentration and agitation. The patient is nervous/anxious.        Objective:   Physical Exam  Constitutional: He is oriented to person, place, and time. He appears well-developed and well-nourished. No distress.  HENT:  Head: Normocephalic and atraumatic.  Mouth/Throat: Oropharynx is  clear and moist. No oropharyngeal exudate.  Eyes: Conjunctivae are normal. Right eye exhibits no discharge. Left eye exhibits no discharge. No scleral icterus.  Neck: Normal range of motion. Neck supple. No JVD present. No tracheal deviation present.  Cardiovascular: Normal rate, regular rhythm, normal heart sounds and intact distal pulses.  Exam reveals no gallop and no friction rub.   No murmur heard. Pulmonary/Chest: Effort normal and breath sounds normal. No stridor. No respiratory distress. He has no wheezes. He has no rales. He exhibits no tenderness.  Abdominal: Soft. Bowel sounds are normal. He exhibits no distension and no mass. There is no tenderness. There is no rebound and no guarding.  Musculoskeletal: Normal range of motion. He exhibits no edema or tenderness.  Lymphadenopathy:    He has no cervical adenopathy.  Neurological: He is oriented to person, place, and time.  Skin: Skin is warm. No rash noted. He is not diaphoretic. No erythema. No pallor.  Psychiatric: He has a normal mood and affect. His behavior is normal. Judgment and thought content normal.     Lab Results  Component Value Date   WBC 7.6 05/24/2014   HGB 14.9 05/24/2014   HCT 44.8 05/24/2014   PLT 273.0 05/24/2014   GLUCOSE 91 05/24/2014   CHOL 199 05/24/2014   TRIG 173.0* 05/24/2014   HDL 39.60 05/24/2014   LDLDIRECT 156.1 07/08/2007   LDLCALC 125* 05/24/2014   ALT 26 05/24/2014   AST 20 05/24/2014   NA 134* 05/24/2014   K 4.3 05/24/2014   CL 99 05/24/2014   CREATININE 1.0 05/24/2014   BUN 15 05/24/2014   CO2 24 05/24/2014   TSH 10.01* 10/23/2014   PSA 0.85 07/28/2012   HGBA1C 5.2 07/28/2012       Assessment & Plan:

## 2015-02-15 NOTE — Patient Instructions (Signed)
Hypothyroidism The thyroid is a large gland located in the lower front of your neck. The thyroid gland helps control metabolism. Metabolism is how your body handles food. It controls metabolism with the hormone thyroxine. When this gland is underactive (hypothyroid), it produces too little hormone.  CAUSES These include:   Absence or destruction of thyroid tissue.  Goiter due to iodine deficiency.  Goiter due to medications.  Congenital defects (since birth).  Problems with the pituitary. This causes a lack of TSH (thyroid stimulating hormone). This hormone tells the thyroid to turn out more hormone. SYMPTOMS  Lethargy (feeling as though you have no energy)  Cold intolerance  Weight gain (in spite of normal food intake)  Dry skin  Coarse hair  Menstrual irregularity (if severe, may lead to infertility)  Slowing of thought processes Cardiac problems are also caused by insufficient amounts of thyroid hormone. Hypothyroidism in the newborn is cretinism, and is an extreme form. It is important that this form be treated adequately and immediately or it will lead rapidly to retarded physical and mental development. DIAGNOSIS  To prove hypothyroidism, your caregiver may do blood tests and ultrasound tests. Sometimes the signs are hidden. It may be necessary for your caregiver to watch this illness with blood tests either before or after diagnosis and treatment. TREATMENT  Low levels of thyroid hormone are increased by using synthetic thyroid hormone. This is a safe, effective treatment. It usually takes about four weeks to gain the full effects of the medication. After you have the full effect of the medication, it will generally take another four weeks for problems to leave. Your caregiver may start you on low doses. If you have had heart problems the dose may be gradually increased. It is generally not an emergency to get rapidly to normal. HOME CARE INSTRUCTIONS   Take your  medications as your caregiver suggests. Let your caregiver know of any medications you are taking or start taking. Your caregiver will help you with dosage schedules.  As your condition improves, your dosage needs may increase. It will be necessary to have continuing blood tests as suggested by your caregiver.  Report all suspected medication side effects to your caregiver. SEEK MEDICAL CARE IF: Seek medical care if you develop:  Sweating.  Tremulousness (tremors).  Anxiety.  Rapid weight loss.  Heat intolerance.  Emotional swings.  Diarrhea.  Weakness. SEEK IMMEDIATE MEDICAL CARE IF:  You develop chest pain, an irregular heart beat (palpitations), or a rapid heart beat. MAKE SURE YOU:   Understand these instructions.  Will watch your condition.  Will get help right away if you are not doing well or get worse. Document Released: 09/29/2005 Document Revised: 12/22/2011 Document Reviewed: 05/19/2008 [employer] Patient Information 2015 [employer]. This information is not intended to replace advice given to you by your health care provider. Make sure you discuss any questions you have with your health care provider.  

## 2015-02-16 ENCOUNTER — Encounter: Payer: Self-pay | Admitting: Internal Medicine

## 2015-02-16 DIAGNOSIS — R748 Abnormal levels of other serum enzymes: Secondary | ICD-10-CM | POA: Insufficient documentation

## 2015-02-16 LAB — LDL CHOLESTEROL, DIRECT: Direct LDL: 124 mg/dL

## 2015-02-16 LAB — LIPID PANEL
Cholesterol: 202 mg/dL — ABNORMAL HIGH (ref 0–200)
HDL: 38.1 mg/dL — ABNORMAL LOW (ref >39.00)
NONHDL: 163.9
Total CHOL/HDL Ratio: 5
Triglycerides: 277 mg/dL — ABNORMAL HIGH (ref 0.0–149.0)
VLDL: 55.4 mg/dL — AB (ref 0.0–40.0)

## 2015-02-16 LAB — CK: CK TOTAL: 2613 U/L — AB (ref 7–232)

## 2015-02-16 MED ORDER — TADALAFIL 20 MG PO TABS: 20.0000 mg | ORAL_TABLET | Freq: Every day | ORAL | Status: DC | PRN

## 2015-02-16 MED ORDER — LEVOTHYROXINE SODIUM 200 MCG PO TABS: 200.0000 ug | ORAL_TABLET | Freq: Every day | ORAL | Status: DC

## 2015-02-16 NOTE — Assessment & Plan Note (Signed)
He gets relief from his pain with percocet - will continue He does not want to have any xrays done and does not want to do PT or other modalities to relieve the pain

## 2015-02-16 NOTE — Assessment & Plan Note (Signed)
His TSH is still slightly elevated, will increase his dose

## 2015-02-16 NOTE — Assessment & Plan Note (Signed)
I checked a CPK level today because he takes a statin and has chronic pain It is elevated and he has been very active working out at the gym recently - he does not have muscle pain so I don't think he has rhabdo - I think this is an elevation caused be the workouts but have asked him to return soon to recheck this

## 2015-02-16 NOTE — Assessment & Plan Note (Signed)
He has achieved his LDL goal  His CPK is elevated, will recheck and consider discontinuation of the statin if indicated

## 2015-02-16 NOTE — Assessment & Plan Note (Signed)
He will cony klonopin as needed

## 2015-02-16 NOTE — Assessment & Plan Note (Signed)
Will treat this with cialis

## 2015-02-27 ENCOUNTER — Other Ambulatory Visit: Payer: Self-pay | Admitting: Internal Medicine

## 2015-02-27 DIAGNOSIS — R748 Abnormal levels of other serum enzymes: Secondary | ICD-10-CM

## 2015-03-01 ENCOUNTER — Other Ambulatory Visit (INDEPENDENT_AMBULATORY_CARE_PROVIDER_SITE_OTHER): Payer: BLUE CROSS/BLUE SHIELD

## 2015-03-01 ENCOUNTER — Encounter: Payer: Self-pay | Admitting: Internal Medicine

## 2015-03-01 ENCOUNTER — Ambulatory Visit (INDEPENDENT_AMBULATORY_CARE_PROVIDER_SITE_OTHER): Payer: BLUE CROSS/BLUE SHIELD | Admitting: Internal Medicine

## 2015-03-01 VITALS — BP 110/80 | HR 78 | Temp 98.6°F | Ht 68.0 in | Wt 208.0 lb

## 2015-03-01 DIAGNOSIS — R748 Abnormal levels of other serum enzymes: Secondary | ICD-10-CM

## 2015-03-01 DIAGNOSIS — L247 Irritant contact dermatitis due to plants, except food: Secondary | ICD-10-CM | POA: Diagnosis not present

## 2015-03-01 LAB — CK: CK TOTAL: 50 U/L (ref 7–232)

## 2015-03-01 MED ORDER — CLOBETASOL PROPIONATE 0.05 % EX OINT: 1.0000 "application " | TOPICAL_OINTMENT | Freq: Two times a day (BID) | CUTANEOUS | Status: DC

## 2015-03-01 MED ORDER — HYDROXYZINE HCL 25 MG PO TABS: 25.0000 mg | ORAL_TABLET | Freq: Three times a day (TID) | ORAL | Status: DC | PRN

## 2015-03-01 MED ORDER — METHYLPREDNISOLONE 4 MG PO TBPK: ORAL_TABLET | ORAL | Status: DC

## 2015-03-01 NOTE — Progress Notes (Signed)
Pre visit review using our clinic review tool, if applicable. No additional management support is needed unless otherwise documented below in the visit note. 

## 2015-03-01 NOTE — Patient Instructions (Signed)

## 2015-03-03 ENCOUNTER — Encounter: Payer: Self-pay | Admitting: Internal Medicine

## 2015-03-03 NOTE — Progress Notes (Signed)
Subjective:  Patient ID: Jonathan Luna, male    DOB: 1965/11/19  Age: 49 y.o. MRN: [id number]  CC: Rash   HPI Jonathan Luna presents for itchy rash over the arms, torso, and legs for several days after a known exposure to poison ivy. He has not treated this in any way. He also returns to recheck his CPK level, he has not worked out in the last few days and denies any muscle aches but has his usual low back pain that is unchanged.  Outpatient Prescriptions Prior to Visit  Medication Sig Dispense Refill  . aspirin 81 MG tablet Take 81 mg by mouth daily.    . clonazePAM (KLONOPIN) 2 MG tablet TID as needed for anxiety 90 tablet 5  . levothyroxine (SYNTHROID) 200 MCG tablet Take 1 tablet (200 mcg total) by mouth daily before breakfast. 90 tablet 1  . mometasone (NASONEX) 50 MCG/ACT nasal spray Place 2 sprays into the nose daily. 17 g 12  . Multiple Vitamin (MULTIVITAMIN) tablet Take 1 tablet by mouth daily.      Marland Kitchen oxyCODONE-acetaminophen (PERCOCET) 10-325 MG per tablet Take 1 tablet by mouth every 4 (four) hours as needed for pain. 100 tablet 0  . tadalafil (CIALIS) 20 MG tablet Take 1 tablet (20 mg total) by mouth daily as needed for erectile dysfunction. 10 tablet 11  . pravastatin (PRAVACHOL) 40 MG tablet TAKE ONE TABLET BY MOUTH ONCE DAILY 90 tablet 3   No facility-administered medications prior to visit.    ROS Review of Systems  Constitutional: Negative.  Negative for fever, chills, diaphoresis, appetite change and fatigue.  HENT: Negative.  Negative for trouble swallowing.   Eyes: Negative.   Respiratory: Negative.  Negative for cough, choking, chest tightness, shortness of breath and stridor.   Cardiovascular: Negative.  Negative for chest pain, palpitations and leg swelling.  Gastrointestinal: Negative.  Negative for nausea, vomiting, abdominal pain, diarrhea, constipation and blood in stool.  Endocrine: Negative.   Genitourinary: Negative.  Negative for hematuria, flank pain and  difficulty urinating.  Musculoskeletal: Positive for back pain. Negative for myalgias, joint swelling, arthralgias, gait problem, neck pain and neck stiffness.  Skin: Positive for rash. Negative for pallor and wound.  Allergic/Immunologic: Negative.   Neurological: Negative.   Hematological: Negative.  Negative for adenopathy. Does not bruise/bleed easily.  Psychiatric/Behavioral: Negative for sleep disturbance and dysphoric mood. The patient is nervous/anxious.     Objective:  BP 110/80 mmHg  Pulse 78  Temp(Src) 98.6 F (37 C) (Oral)  Ht 5\' 8"  (1.727 m)  Wt 208 lb (94.348 kg)  BMI 31.63 kg/m2  SpO2 97%  BP Readings from Last 3 Encounters:  03/01/15 110/80  02/15/15 118/72  10/23/14 138/90    Wt Readings from Last 3 Encounters:  03/01/15 208 lb (94.348 kg)  02/15/15 212 lb (96.163 kg)  10/23/14 212 lb (96.163 kg)    Physical Exam  Constitutional: He is oriented to person, place, and time. He appears well-developed and well-nourished. No distress.  HENT:  Head: Normocephalic and atraumatic.  Mouth/Throat: Oropharynx is clear and moist. No oropharyngeal exudate.  Eyes: Conjunctivae are normal. Right eye exhibits no discharge. Left eye exhibits no discharge. No scleral icterus.  Neck: Normal range of motion. Neck supple. No JVD present. No tracheal deviation present. No thyromegaly present.  Cardiovascular: Normal rate, regular rhythm, normal heart sounds and intact distal pulses.  Exam reveals no gallop and no friction rub.   No murmur heard. Pulmonary/Chest: Effort normal and  breath sounds normal. No stridor. No respiratory distress. He has no wheezes. He has no rales. He exhibits no tenderness.  Abdominal: Soft. Bowel sounds are normal. He exhibits no distension and no mass. There is no tenderness. There is no rebound and no guarding.  Musculoskeletal: Normal range of motion. He exhibits no edema or tenderness.  Lymphadenopathy:    He has no cervical adenopathy.    Neurological: He is alert and oriented to person, place, and time. He has normal reflexes.  Skin: Skin is warm and dry. Rash noted. He is not diaphoretic. No erythema. No pallor.     There are linear erythematous streaks over the exposed surfaces of the arms and legs and a few lesions on the torso.  Psychiatric: He has a normal mood and affect. His behavior is normal. Judgment and thought content normal.  Vitals reviewed.   Lab Results  Component Value Date   WBC 7.6 05/24/2014   HGB 14.9 05/24/2014   HCT 44.8 05/24/2014   PLT 273.0 05/24/2014   GLUCOSE 91 05/24/2014   CHOL 202* 02/15/2015   TRIG 277.0* 02/15/2015   HDL 38.10* 02/15/2015   LDLDIRECT 124.0 02/15/2015   LDLCALC 125* 05/24/2014   ALT 26 05/24/2014   AST 20 05/24/2014   NA 134* 05/24/2014   K 4.3 05/24/2014   CL 99 05/24/2014   CREATININE 1.0 05/24/2014   BUN 15 05/24/2014   CO2 24 05/24/2014   TSH 5.30* 02/15/2015   PSA 0.85 07/28/2012   HGBA1C 5.2 07/28/2012    Dg Chest 2 View  02/22/2014   CLINICAL DATA:  Cough and sore throat for 5 days.  EXAM: CHEST  2 VIEW  COMPARISON:  PA and lateral chest 04/16/2007.  FINDINGS: The lungs are clear. Heart size is normal. No pneumothorax or pleural effusion.  IMPRESSION: Negative chest.   Electronically Signed   By: Inge Rise M.D.   On: 02/22/2014 13:47    Assessment & Plan:   Jonathan Luna was seen today for rash.  Diagnoses and all orders for this visit:  Contact dermatitis and eczema due to plant Orders: -     methylPREDNISolone (MEDROL DOSEPAK) 4 MG TBPK tablet; TAKE AS DIRECTED -     hydrOXYzine (ATARAX/VISTARIL) 25 MG tablet; Take 1 tablet (25 mg total) by mouth 3 (three) times daily as needed. -     clobetasol ointment (TEMOVATE) 0.05 %; Apply 1 application topically 2 (two) times daily.  Elevated CPK - his CPK level is normal now, will hold the statin for now, I have asked him to avoid heavy workouts, he will stay well hydrated, will stop the creatine  supplements. Orders: -     CK; Future   I have discontinued Jonathan Luna pravastatin. I am also having him start on methylPREDNISolone, hydrOXYzine, and clobetasol ointment. Additionally, I am having him maintain his multivitamin, aspirin, mometasone, oxyCODONE-acetaminophen, clonazePAM, levothyroxine, and tadalafil.  Meds ordered this encounter  Medications  . methylPREDNISolone (MEDROL DOSEPAK) 4 MG TBPK tablet    Sig: TAKE AS DIRECTED    Dispense:  21 tablet    Refill:  0  . hydrOXYzine (ATARAX/VISTARIL) 25 MG tablet    Sig: Take 1 tablet (25 mg total) by mouth 3 (three) times daily as needed.    Dispense:  30 tablet    Refill:  0  . clobetasol ointment (TEMOVATE) 0.05 %    Sig: Apply 1 application topically 2 (two) times daily.    Dispense:  60 g  Refill:  1     Follow-up: Return in about 3 weeks (around 03/22/2015).  Scarlette Calico, MD

## 2015-03-06 ENCOUNTER — Ambulatory Visit: Payer: BLUE CROSS/BLUE SHIELD | Admitting: Internal Medicine

## 2015-04-30 ENCOUNTER — Telehealth: Payer: Self-pay | Admitting: Internal Medicine

## 2015-04-30 NOTE — Telephone Encounter (Signed)
It is too early for a refill.

## 2015-04-30 NOTE — Telephone Encounter (Signed)
Pt called in and needs refills on his oxyCODONE-acetaminophen (PERCOCET) 10-325 MG per tablet [[id number]]

## 2015-05-02 NOTE — Telephone Encounter (Signed)
Patient notified via mychart

## 2015-05-17 ENCOUNTER — Encounter: Payer: Self-pay | Admitting: Internal Medicine

## 2015-05-17 ENCOUNTER — Ambulatory Visit (INDEPENDENT_AMBULATORY_CARE_PROVIDER_SITE_OTHER): Payer: BLUE CROSS/BLUE SHIELD | Admitting: Internal Medicine

## 2015-05-17 ENCOUNTER — Other Ambulatory Visit (INDEPENDENT_AMBULATORY_CARE_PROVIDER_SITE_OTHER): Payer: BLUE CROSS/BLUE SHIELD

## 2015-05-17 VITALS — BP 140/89 | HR 79 | Temp 97.6°F | Resp 16 | Ht 68.0 in | Wt 204.0 lb

## 2015-05-17 DIAGNOSIS — J302 Other seasonal allergic rhinitis: Secondary | ICD-10-CM | POA: Diagnosis not present

## 2015-05-17 DIAGNOSIS — L247 Irritant contact dermatitis due to plants, except food: Secondary | ICD-10-CM

## 2015-05-17 DIAGNOSIS — M545 Low back pain: Secondary | ICD-10-CM | POA: Diagnosis not present

## 2015-05-17 DIAGNOSIS — E038 Other specified hypothyroidism: Secondary | ICD-10-CM | POA: Diagnosis not present

## 2015-05-17 DIAGNOSIS — M5442 Lumbago with sciatica, left side: Secondary | ICD-10-CM

## 2015-05-17 DIAGNOSIS — G894 Chronic pain syndrome: Secondary | ICD-10-CM | POA: Diagnosis not present

## 2015-05-17 LAB — TSH: TSH: 0.12 u[IU]/mL — ABNORMAL LOW (ref 0.35–4.50)

## 2015-05-17 MED ORDER — MOMETASONE FUROATE 50 MCG/ACT NA SUSP: 2.0000 | Freq: Every day | NASAL | Status: DC

## 2015-05-17 MED ORDER — OXYCODONE-ACETAMINOPHEN 10-325 MG PO TABS: 1.0000 | ORAL_TABLET | ORAL | Status: DC | PRN

## 2015-05-17 NOTE — Progress Notes (Signed)
Pre visit review using our clinic review tool, if applicable. No additional management support is needed unless otherwise documented below in the visit note. 

## 2015-05-17 NOTE — Progress Notes (Signed)
Subjective:  Patient ID: Jonathan Luna, male    DOB: 09-28-1966  Age: 49 y.o. MRN: [id number]  CC: Hypothyroidism; Back Pain; and Allergic Rhinitis    HPI Jonathan Luna presents for routine follow-up. He offers no new or different symptoms today. He has persistent, chronic low back pain that occasionally radiates into his left lower extremity. He does not wish to pursue any other treatment options such as x-rays, physical therapy, or surgery.  Outpatient Prescriptions Prior to Visit  Medication Sig Dispense Refill  . aspirin 81 MG tablet Take 81 mg by mouth daily.    . clonazePAM (KLONOPIN) 2 MG tablet TID as needed for anxiety 90 tablet 5  . Multiple Vitamin (MULTIVITAMIN) tablet Take 1 tablet by mouth daily.      . tadalafil (CIALIS) 20 MG tablet Take 1 tablet (20 mg total) by mouth daily as needed for erectile dysfunction. 10 tablet 11  . clobetasol ointment (TEMOVATE) 6.55 % Apply 1 application topically 2 (two) times daily. 60 g 1  . hydrOXYzine (ATARAX/VISTARIL) 25 MG tablet Take 1 tablet (25 mg total) by mouth 3 (three) times daily as needed. 30 tablet 0  . levothyroxine (SYNTHROID) 200 MCG tablet Take 1 tablet (200 mcg total) by mouth daily before breakfast. 90 tablet 1  . methylPREDNISolone (MEDROL DOSEPAK) 4 MG TBPK tablet TAKE AS DIRECTED 21 tablet 0  . mometasone (NASONEX) 50 MCG/ACT nasal spray Place 2 sprays into the nose daily. 17 g 12  . oxyCODONE-acetaminophen (PERCOCET) 10-325 MG per tablet Take 1 tablet by mouth every 4 (four) hours as needed for pain. 100 tablet 0   No facility-administered medications prior to visit.    ROS Review of Systems  Constitutional: Negative.  Negative for fever, chills, diaphoresis, appetite change and fatigue.  HENT: Positive for congestion, postnasal drip and rhinorrhea. Negative for sinus pressure, sore throat and trouble swallowing.   Eyes: Negative.   Respiratory: Negative.  Negative for cough, choking, chest tightness, shortness of  breath and stridor.   Cardiovascular: Negative.  Negative for chest pain, palpitations and leg swelling.  Gastrointestinal: Negative.  Negative for nausea, vomiting, abdominal pain, diarrhea, constipation and blood in stool.  Endocrine: Negative.   Genitourinary: Negative.   Musculoskeletal: Positive for back pain. Negative for myalgias, joint swelling, arthralgias, gait problem, neck pain and neck stiffness.  Skin: Negative.  Negative for rash.  Allergic/Immunologic: Negative.   Neurological: Negative.  Negative for dizziness, tremors, syncope, light-headedness and numbness.  Hematological: Negative.  Negative for adenopathy. Does not bruise/bleed easily.  Psychiatric/Behavioral: Positive for sleep disturbance. Negative for suicidal ideas, hallucinations, behavioral problems, confusion, self-injury, dysphoric mood, decreased concentration and agitation. The patient is nervous/anxious. The patient is not hyperactive.     Objective:  BP 140/89 mmHg  Pulse 79  Temp(Src) 97.6 F (36.4 C) (Oral)  Resp 16  Ht 5\' 8"  (1.727 m)  Wt 204 lb (92.534 kg)  BMI 31.03 kg/m2  SpO2 97%  BP Readings from Last 3 Encounters:  05/17/15 140/89  03/01/15 110/80  02/15/15 118/72    Wt Readings from Last 3 Encounters:  05/17/15 204 lb (92.534 kg)  03/01/15 208 lb (94.348 kg)  02/15/15 212 lb (96.163 kg)    Physical Exam  Constitutional: He is oriented to person, place, and time. No distress.  HENT:  Nose: Mucosal edema and rhinorrhea present. Right sinus exhibits no maxillary sinus tenderness and no frontal sinus tenderness. Left sinus exhibits no maxillary sinus tenderness and no frontal sinus tenderness.  Mouth/Throat: Oropharynx is clear and moist. No oropharyngeal exudate.  Eyes: Right eye exhibits no discharge. Left eye exhibits no discharge. No scleral icterus.  Neck: Normal range of motion. Neck supple. No JVD present. No tracheal deviation present. No thyromegaly present.  Cardiovascular:  Normal rate, regular rhythm, normal heart sounds and intact distal pulses.  Exam reveals no gallop and no friction rub.   No murmur heard. Pulmonary/Chest: Effort normal and breath sounds normal. No stridor. No respiratory distress. He has no wheezes. He has no rales. He exhibits no tenderness.  Abdominal: Soft. Bowel sounds are normal. He exhibits no distension and no mass. There is no tenderness. There is no rebound and no guarding.  Musculoskeletal: Normal range of motion. He exhibits no edema or tenderness.       Lumbar back: Normal. He exhibits normal range of motion, no tenderness, no bony tenderness, no swelling, no edema, no laceration, no pain, no spasm and normal pulse.  Lymphadenopathy:    He has no cervical adenopathy.  Neurological: He is oriented to person, place, and time. He has normal strength. He displays no atrophy, no tremor and normal reflexes. No cranial nerve deficit or sensory deficit. He exhibits normal muscle tone. He displays a negative Romberg sign. He displays no seizure activity. Coordination and gait normal. He displays no Babinski's sign on the right side. He displays no Babinski's sign on the left side.  Reflex Scores:      Tricep reflexes are 1+ on the right side and 1+ on the left side.      Bicep reflexes are 1+ on the right side and 1+ on the left side.      Brachioradialis reflexes are 1+ on the right side and 1+ on the left side.      Patellar reflexes are 1+ on the right side and 1+ on the left side.      Achilles reflexes are 1+ on the right side and 1+ on the left side. Negative straight leg raise in bilateral lower extremity  Skin: Skin is warm and dry. No rash noted. He is not diaphoretic. No erythema. No pallor.  Psychiatric: He has a normal mood and affect. His behavior is normal. Judgment and thought content normal.  Vitals reviewed.   Lab Results  Component Value Date   WBC 7.6 05/24/2014   HGB 14.9 05/24/2014   HCT 44.8 05/24/2014   PLT 273.0  05/24/2014   GLUCOSE 85 05/17/2015   CHOL 202* 02/15/2015   TRIG 277.0* 02/15/2015   HDL 38.10* 02/15/2015   LDLDIRECT 124.0 02/15/2015   LDLCALC 125* 05/24/2014   ALT 26 05/24/2014   AST 20 05/24/2014   NA 136 05/17/2015   K 4.5 05/17/2015   CL 101 05/17/2015   CREATININE 1.04 05/17/2015   BUN 15 05/17/2015   CO2 27 05/17/2015   TSH 0.12* 05/17/2015   PSA 0.85 07/28/2012   HGBA1C 5.2 07/28/2012    Dg Chest 2 View  02/22/2014   CLINICAL DATA:  Cough and sore throat for 5 days.  EXAM: CHEST  2 VIEW  COMPARISON:  PA and lateral chest 04/16/2007.  FINDINGS: The lungs are clear. Heart size is normal. No pneumothorax or pleural effusion.  IMPRESSION: Negative chest.   Electronically Signed   By: Inge Rise M.D.   On: 02/22/2014 13:47    Assessment & Plan:   Tyshaun was seen today for hypothyroidism, back pain and allergic rhinitis .  Diagnoses and all orders for this visit:  Other seasonal allergic rhinitis- will restart Nasonex Orders: -     mometasone (NASONEX) 50 MCG/ACT nasal spray; Place 2 sprays into the nose daily.  Midline low back pain with left-sided sciatica- will continue Percocet as needed for pain. Orders: -     Discontinue: oxyCODONE-acetaminophen (PERCOCET) 10-325 MG per tablet; Take 1 tablet by mouth every 4 (four) hours as needed for pain. -     Discontinue: oxyCODONE-acetaminophen (PERCOCET) 10-325 MG per tablet; Take 1 tablet by mouth every 4 (four) hours as needed for pain. -     oxyCODONE-acetaminophen (PERCOCET) 10-325 MG per tablet; Take 1 tablet by mouth every 4 (four) hours as needed for pain.  Chronic pain disorder Orders: -     Discontinue: oxyCODONE-acetaminophen (PERCOCET) 10-325 MG per tablet; Take 1 tablet by mouth every 4 (four) hours as needed for pain. -     Discontinue: oxyCODONE-acetaminophen (PERCOCET) 10-325 MG per tablet; Take 1 tablet by mouth every 4 (four) hours as needed for pain. -     oxyCODONE-acetaminophen (PERCOCET) 10-325 MG  per tablet; Take 1 tablet by mouth every 4 (four) hours as needed for pain.  Low back pain without sciatica, unspecified back pain laterality Orders: -     Discontinue: oxyCODONE-acetaminophen (PERCOCET) 10-325 MG per tablet; Take 1 tablet by mouth every 4 (four) hours as needed for pain. -     Discontinue: oxyCODONE-acetaminophen (PERCOCET) 10-325 MG per tablet; Take 1 tablet by mouth every 4 (four) hours as needed for pain. -     oxyCODONE-acetaminophen (PERCOCET) 10-325 MG per tablet; Take 1 tablet by mouth every 4 (four) hours as needed for pain.  Contact dermatitis and eczema due to plant  Other specified hypothyroidism- his TSH is suppressed so I have lowered his levothyroxine dose. Orders: -     Basic metabolic panel; Future -     TSH; Future -     levothyroxine (SYNTHROID, LEVOTHROID) 150 MCG tablet; Take 1 tablet (150 mcg total) by mouth daily.  Other orders -     Cancel: hydrOXYzine (ATARAX/VISTARIL) 25 MG tablet; Take 1 tablet (25 mg total) by mouth 3 (three) times daily as needed.   I have discontinued Mr. Mixson levothyroxine, methylPREDNISolone, hydrOXYzine, and clobetasol ointment. I am also having him start on levothyroxine. Additionally, I am having him maintain his multivitamin, aspirin, clonazePAM, tadalafil, mometasone, and oxyCODONE-acetaminophen.  Meds ordered this encounter  Medications  . mometasone (NASONEX) 50 MCG/ACT nasal spray    Sig: Place 2 sprays into the nose daily.    Dispense:  17 g    Refill:  12  . DISCONTD: oxyCODONE-acetaminophen (PERCOCET) 10-325 MG per tablet    Sig: Take 1 tablet by mouth every 4 (four) hours as needed for pain.    Dispense:  100 tablet    Refill:  0    Fill on or after 05/17/15  . DISCONTD: oxyCODONE-acetaminophen (PERCOCET) 10-325 MG per tablet    Sig: Take 1 tablet by mouth every 4 (four) hours as needed for pain.    Dispense:  100 tablet    Refill:  0    Fill on or after 06/17/15  . oxyCODONE-acetaminophen (PERCOCET)  10-325 MG per tablet    Sig: Take 1 tablet by mouth every 4 (four) hours as needed for pain.    Dispense:  100 tablet    Refill:  0    Fill on or after 07/17/15  . levothyroxine (SYNTHROID, LEVOTHROID) 150 MCG tablet    Sig: Take 1 tablet (150  mcg total) by mouth daily.    Dispense:  90 tablet    Refill:  1     Follow-up: Return in about 3 months (around 08/17/2015).  Scarlette Calico, MD

## 2015-05-17 NOTE — Patient Instructions (Signed)
Hypothyroidism The thyroid is a large gland located in the lower front of your neck. The thyroid gland helps control metabolism. Metabolism is how your body handles food. It controls metabolism with the hormone thyroxine. When this gland is underactive (hypothyroid), it produces too little hormone.  CAUSES These include:   Absence or destruction of thyroid tissue.  Goiter due to iodine deficiency.  Goiter due to medications.  Congenital defects (since birth).  Problems with the pituitary. This causes a lack of TSH (thyroid stimulating hormone). This hormone tells the thyroid to turn out more hormone. SYMPTOMS  Lethargy (feeling as though you have no energy)  Cold intolerance  Weight gain (in spite of normal food intake)  Dry skin  Coarse hair  Menstrual irregularity (if severe, may lead to infertility)  Slowing of thought processes Cardiac problems are also caused by insufficient amounts of thyroid hormone. Hypothyroidism in the newborn is cretinism, and is an extreme form. It is important that this form be treated adequately and immediately or it will lead rapidly to retarded physical and mental development. DIAGNOSIS  To prove hypothyroidism, your caregiver may do blood tests and ultrasound tests. Sometimes the signs are hidden. It may be necessary for your caregiver to watch this illness with blood tests either before or after diagnosis and treatment. TREATMENT  Low levels of thyroid hormone are increased by using synthetic thyroid hormone. This is a safe, effective treatment. It usually takes about four weeks to gain the full effects of the medication. After you have the full effect of the medication, it will generally take another four weeks for problems to leave. Your caregiver may start you on low doses. If you have had heart problems the dose may be gradually increased. It is generally not an emergency to get rapidly to normal. HOME CARE INSTRUCTIONS   Take your  medications as your caregiver suggests. Let your caregiver know of any medications you are taking or start taking. Your caregiver will help you with dosage schedules.  As your condition improves, your dosage needs may increase. It will be necessary to have continuing blood tests as suggested by your caregiver.  Report all suspected medication side effects to your caregiver. SEEK MEDICAL CARE IF: Seek medical care if you develop:  Sweating.  Tremulousness (tremors).  Anxiety.  Rapid weight loss.  Heat intolerance.  Emotional swings.  Diarrhea.  Weakness. SEEK IMMEDIATE MEDICAL CARE IF:  You develop chest pain, an irregular heart beat (palpitations), or a rapid heart beat. MAKE SURE YOU:   Understand these instructions.  Will watch your condition.  Will get help right away if you are not doing well or get worse. Document Released: 09/29/2005 Document Revised: 12/22/2011 Document Reviewed: 05/19/2008 [employer] Patient Information 2015 [employer]. This information is not intended to replace advice given to you by your health care provider. Make sure you discuss any questions you have with your health care provider.  

## 2015-05-18 ENCOUNTER — Encounter: Payer: Self-pay | Admitting: Internal Medicine

## 2015-05-18 LAB — BASIC METABOLIC PANEL
BUN: 15 mg/dL (ref 6–23)
CO2: 27 meq/L (ref 19–32)
CREATININE: 1.04 mg/dL (ref 0.40–1.50)
Calcium: 9.2 mg/dL (ref 8.4–10.5)
Chloride: 101 mEq/L (ref 96–112)
GFR: 80.66 mL/min (ref >60.00)
Glucose, Bld: 85 mg/dL (ref 70–99)
Potassium: 4.5 mEq/L (ref 3.5–5.1)
Sodium: 136 mEq/L (ref 135–145)

## 2015-05-18 MED ORDER — LEVOTHYROXINE SODIUM 150 MCG PO TABS: 150.0000 ug | ORAL_TABLET | Freq: Every day | ORAL | Status: DC

## 2015-08-14 ENCOUNTER — Other Ambulatory Visit (INDEPENDENT_AMBULATORY_CARE_PROVIDER_SITE_OTHER): Payer: BLUE CROSS/BLUE SHIELD

## 2015-08-14 ENCOUNTER — Encounter: Payer: Self-pay | Admitting: Internal Medicine

## 2015-08-14 ENCOUNTER — Ambulatory Visit (INDEPENDENT_AMBULATORY_CARE_PROVIDER_SITE_OTHER): Payer: BLUE CROSS/BLUE SHIELD | Admitting: Internal Medicine

## 2015-08-14 VITALS — BP 130/84 | HR 80 | Temp 98.6°F | Resp 16 | Ht 68.0 in | Wt 214.0 lb

## 2015-08-14 DIAGNOSIS — E038 Other specified hypothyroidism: Secondary | ICD-10-CM | POA: Diagnosis not present

## 2015-08-14 DIAGNOSIS — F411 Generalized anxiety disorder: Secondary | ICD-10-CM | POA: Diagnosis not present

## 2015-08-14 DIAGNOSIS — J302 Other seasonal allergic rhinitis: Secondary | ICD-10-CM

## 2015-08-14 DIAGNOSIS — M5442 Lumbago with sciatica, left side: Secondary | ICD-10-CM

## 2015-08-14 DIAGNOSIS — G894 Chronic pain syndrome: Secondary | ICD-10-CM

## 2015-08-14 DIAGNOSIS — F410 Panic disorder [episodic paroxysmal anxiety] without agoraphobia: Secondary | ICD-10-CM

## 2015-08-14 DIAGNOSIS — M545 Low back pain: Secondary | ICD-10-CM

## 2015-08-14 LAB — TSH: TSH: 13.93 u[IU]/mL — ABNORMAL HIGH (ref 0.35–4.50)

## 2015-08-14 MED ORDER — CLONAZEPAM 2 MG PO TABS: ORAL_TABLET | ORAL | Status: DC

## 2015-08-14 MED ORDER — OXYCODONE-ACETAMINOPHEN 10-325 MG PO TABS: 1.0000 | ORAL_TABLET | ORAL | Status: DC | PRN

## 2015-08-14 MED ORDER — MOMETASONE FUROATE 50 MCG/ACT NA SUSP: 2.0000 | Freq: Every day | NASAL | Status: DC

## 2015-08-14 MED ORDER — LEVOTHYROXINE SODIUM 175 MCG PO TABS: 175.0000 ug | ORAL_TABLET | Freq: Every day | ORAL | Status: DC

## 2015-08-14 NOTE — Progress Notes (Signed)
Subjective:  Patient ID: Jonathan Luna, male    DOB: Feb 25, 1966  Age: 49 y.o. MRN: [id number]  CC: Hypothyroidism; Hyperlipidemia; and Back Pain   HPI Jonathan MAQUEDA presents for follow-up on hypothyroidism and refill on medications for anxiety and chronic low back pain. There has been no change in his back pain, he describes as an intermittent achy sensation that worsens with activity. The pain does not radiate into his lower extremities.  Outpatient Prescriptions Prior to Visit  Medication Sig Dispense Refill  . aspirin 81 MG tablet Take 81 mg by mouth daily.    . Multiple Vitamin (MULTIVITAMIN) tablet Take 1 tablet by mouth daily.      . clonazePAM (KLONOPIN) 2 MG tablet TID as needed for anxiety 90 tablet 5  . levothyroxine (SYNTHROID, LEVOTHROID) 150 MCG tablet Take 1 tablet (150 mcg total) by mouth daily. 90 tablet 1  . mometasone (NASONEX) 50 MCG/ACT nasal spray Place 2 sprays into the nose daily. 17 g 12  . oxyCODONE-acetaminophen (PERCOCET) 10-325 MG per tablet Take 1 tablet by mouth every 4 (four) hours as needed for pain. 100 tablet 0  . tadalafil (CIALIS) 20 MG tablet Take 1 tablet (20 mg total) by mouth daily as needed for erectile dysfunction. 10 tablet 11   No facility-administered medications prior to visit.    ROS Review of Systems  Constitutional: Positive for unexpected weight change (wt gain). Negative for fever, chills, diaphoresis, appetite change and fatigue.  HENT: Negative.   Eyes: Negative.   Respiratory: Negative.  Negative for cough, choking, chest tightness, shortness of breath and stridor.   Cardiovascular: Negative.  Negative for chest pain, palpitations and leg swelling.  Gastrointestinal: Negative.  Negative for nausea, vomiting, abdominal pain, diarrhea and constipation.  Endocrine: Negative.   Genitourinary: Negative.  Negative for difficulty urinating.  Musculoskeletal: Positive for back pain and arthralgias. Negative for myalgias, joint swelling and  neck pain.  Skin: Negative.  Negative for color change and rash.  Allergic/Immunologic: Negative.   Neurological: Negative.  Negative for dizziness.  Hematological: Negative.  Negative for adenopathy. Does not bruise/bleed easily.  Psychiatric/Behavioral: Negative for suicidal ideas, sleep disturbance, dysphoric mood, decreased concentration and agitation. The patient is nervous/anxious (and occasional panic).     Objective:  BP 130/84 mmHg  Pulse 80  Temp(Src) 98.6 F (37 C) (Oral)  Resp 16  Ht 5\' 8"  (1.727 m)  Wt 214 lb (97.07 kg)  BMI 32.55 kg/m2  SpO2 96%  BP Readings from Last 3 Encounters:  08/14/15 130/84  05/17/15 140/89  03/01/15 110/80    Wt Readings from Last 3 Encounters:  08/14/15 214 lb (97.07 kg)  05/17/15 204 lb (92.534 kg)  03/01/15 208 lb (94.348 kg)    Physical Exam  Constitutional: He is oriented to person, place, and time. No distress.  HENT:  Head: Normocephalic and atraumatic.  Mouth/Throat: Oropharynx is clear and moist. No oropharyngeal exudate.  Eyes: Conjunctivae are normal. Right eye exhibits no discharge. Left eye exhibits no discharge. No scleral icterus.  Neck: Normal range of motion. Neck supple. No JVD present. No tracheal deviation present. No thyromegaly present.  Cardiovascular: Normal rate, regular rhythm, normal heart sounds and intact distal pulses.  Exam reveals no gallop and no friction rub.   No murmur heard. Pulmonary/Chest: Effort normal and breath sounds normal. No stridor. No respiratory distress. He has no wheezes. He has no rales. He exhibits no tenderness.  Abdominal: Soft. Bowel sounds are normal. He exhibits no distension  and no mass. There is no tenderness. There is no rebound and no guarding.  Musculoskeletal: Normal range of motion. He exhibits no edema or tenderness.  Lymphadenopathy:    He has no cervical adenopathy.  Neurological: He is alert and oriented to person, place, and time. He has normal strength. He  displays no atrophy, no tremor and normal reflexes. No cranial nerve deficit or sensory deficit. He exhibits normal muscle tone. He displays no seizure activity. Coordination and gait normal.  Reflex Scores:      Brachioradialis reflexes are 1+ on the right side and 1+ on the left side.      Patellar reflexes are 2+ on the right side and 2+ on the left side.      Achilles reflexes are 1+ on the right side and 1+ on the left side. Neg SLR in BLE  Skin: Skin is warm and dry. No rash noted. He is not diaphoretic. No erythema. No pallor.  Psychiatric: His behavior is normal. Judgment and thought content normal. His mood appears anxious. His affect is not angry, not blunt, not labile and not inappropriate. His speech is not rapid and/or pressured. Cognition and memory are normal. He does not exhibit a depressed mood.  Vitals reviewed.   Lab Results  Component Value Date   WBC 7.6 05/24/2014   HGB 14.9 05/24/2014   HCT 44.8 05/24/2014   PLT 273.0 05/24/2014   GLUCOSE 85 05/17/2015   CHOL 202* 02/15/2015   TRIG 277.0* 02/15/2015   HDL 38.10* 02/15/2015   LDLDIRECT 124.0 02/15/2015   LDLCALC 125* 05/24/2014   ALT 26 05/24/2014   AST 20 05/24/2014   NA 136 05/17/2015   K 4.5 05/17/2015   CL 101 05/17/2015   CREATININE 1.04 05/17/2015   BUN 15 05/17/2015   CO2 27 05/17/2015   TSH 13.93* 08/14/2015   PSA 0.85 07/28/2012   HGBA1C 5.2 07/28/2012    Dg Chest 2 View  02/22/2014  CLINICAL DATA:  Cough and sore throat for 5 days. EXAM: CHEST  2 VIEW COMPARISON:  PA and lateral chest 04/16/2007. FINDINGS: The lungs are clear. Heart size is normal. No pneumothorax or pleural effusion. IMPRESSION: Negative chest. Electronically Signed   By: Inge Rise M.D.   On: 02/22/2014 13:47    Assessment & Plan:   Jonathan Luna was seen today for hypothyroidism, hyperlipidemia and back pain.  Diagnoses and all orders for this visit:  Anxiety state -     clonazePAM (KLONOPIN) 2 MG tablet; TID as needed for  anxiety  PANIC DISORDER -     clonazePAM (KLONOPIN) 2 MG tablet; TID as needed for anxiety  Midline low back pain with left-sided sciatica- no changes noted wrt his lower back pain, will cont percocet as needed -     Discontinue: oxyCODONE-acetaminophen (PERCOCET) 10-325 MG tablet; Take 1 tablet by mouth every 4 (four) hours as needed for pain. -     Discontinue: oxyCODONE-acetaminophen (PERCOCET) 10-325 MG tablet; Take 1 tablet by mouth every 4 (four) hours as needed for pain. -     oxyCODONE-acetaminophen (PERCOCET) 10-325 MG tablet; Take 1 tablet by mouth every 4 (four) hours as needed for pain.  Chronic pain disorder -     Discontinue: oxyCODONE-acetaminophen (PERCOCET) 10-325 MG tablet; Take 1 tablet by mouth every 4 (four) hours as needed for pain. -     Discontinue: oxyCODONE-acetaminophen (PERCOCET) 10-325 MG tablet; Take 1 tablet by mouth every 4 (four) hours as needed for pain. -  oxyCODONE-acetaminophen (PERCOCET) 10-325 MG tablet; Take 1 tablet by mouth every 4 (four) hours as needed for pain.  Low back pain without sciatica, unspecified back pain laterality -     Discontinue: oxyCODONE-acetaminophen (PERCOCET) 10-325 MG tablet; Take 1 tablet by mouth every 4 (four) hours as needed for pain. -     Discontinue: oxyCODONE-acetaminophen (PERCOCET) 10-325 MG tablet; Take 1 tablet by mouth every 4 (four) hours as needed for pain. -     oxyCODONE-acetaminophen (PERCOCET) 10-325 MG tablet; Take 1 tablet by mouth every 4 (four) hours as needed for pain.  Other specified hypothyroidism- his TSh is elevated and he has gained weight so I have increased his synthroid dose -     TSH; Future -     levothyroxine (SYNTHROID) 175 MCG tablet; Take 1 tablet (175 mcg total) by mouth daily before breakfast.  Other seasonal allergic rhinitis -     mometasone (NASONEX) 50 MCG/ACT nasal spray; Place 2 sprays into the nose daily.   I have discontinued Mr. Limehouse tadalafil and levothyroxine. I am  also having him start on levothyroxine. Additionally, I am having him maintain his multivitamin, aspirin, clonazePAM, oxyCODONE-acetaminophen, and mometasone.  Meds ordered this encounter  Medications  . clonazePAM (KLONOPIN) 2 MG tablet    Sig: TID as needed for anxiety    Dispense:  90 tablet    Refill:  5  . DISCONTD: oxyCODONE-acetaminophen (PERCOCET) 10-325 MG tablet    Sig: Take 1 tablet by mouth every 4 (four) hours as needed for pain.    Dispense:  100 tablet    Refill:  0    Fill on or after 08/14/15  . DISCONTD: oxyCODONE-acetaminophen (PERCOCET) 10-325 MG tablet    Sig: Take 1 tablet by mouth every 4 (four) hours as needed for pain.    Dispense:  100 tablet    Refill:  0    Fill on or after 09/13/15  . oxyCODONE-acetaminophen (PERCOCET) 10-325 MG tablet    Sig: Take 1 tablet by mouth every 4 (four) hours as needed for pain.    Dispense:  100 tablet    Refill:  0    Fill on or after 10/14/15  . mometasone (NASONEX) 50 MCG/ACT nasal spray    Sig: Place 2 sprays into the nose daily.    Dispense:  17 g    Refill:  12  . levothyroxine (SYNTHROID) 175 MCG tablet    Sig: Take 1 tablet (175 mcg total) by mouth daily before breakfast.    Dispense:  90 tablet    Refill:  1     Follow-up: Return in about 4 months (around 12/12/2015).  Scarlette Calico, MD

## 2015-08-14 NOTE — Progress Notes (Signed)
Pre visit review using our clinic review tool, if applicable. No additional management support is needed unless otherwise documented below in the visit note. 

## 2015-08-14 NOTE — Patient Instructions (Signed)

## 2015-11-14 ENCOUNTER — Encounter: Payer: Self-pay | Admitting: Internal Medicine

## 2015-11-14 ENCOUNTER — Ambulatory Visit (INDEPENDENT_AMBULATORY_CARE_PROVIDER_SITE_OTHER): Payer: BLUE CROSS/BLUE SHIELD | Admitting: Internal Medicine

## 2015-11-14 ENCOUNTER — Other Ambulatory Visit (INDEPENDENT_AMBULATORY_CARE_PROVIDER_SITE_OTHER): Payer: BLUE CROSS/BLUE SHIELD

## 2015-11-14 VITALS — BP 120/86 | HR 87 | Temp 97.8°F | Resp 16 | Ht 68.0 in | Wt 214.0 lb

## 2015-11-14 DIAGNOSIS — E785 Hyperlipidemia, unspecified: Secondary | ICD-10-CM | POA: Diagnosis not present

## 2015-11-14 DIAGNOSIS — E038 Other specified hypothyroidism: Secondary | ICD-10-CM

## 2015-11-14 DIAGNOSIS — F411 Generalized anxiety disorder: Secondary | ICD-10-CM | POA: Diagnosis not present

## 2015-11-14 DIAGNOSIS — M5442 Lumbago with sciatica, left side: Secondary | ICD-10-CM

## 2015-11-14 DIAGNOSIS — F410 Panic disorder [episodic paroxysmal anxiety] without agoraphobia: Secondary | ICD-10-CM | POA: Diagnosis not present

## 2015-11-14 DIAGNOSIS — G8929 Other chronic pain: Secondary | ICD-10-CM

## 2015-11-14 DIAGNOSIS — G894 Chronic pain syndrome: Secondary | ICD-10-CM

## 2015-11-14 DIAGNOSIS — M545 Low back pain: Secondary | ICD-10-CM

## 2015-11-14 LAB — LIPID PANEL
CHOLESTEROL: 150 mg/dL (ref 0–200)
HDL: 35.8 mg/dL — ABNORMAL LOW (ref >39.00)
LDL CALC: 93 mg/dL (ref 0–99)
NonHDL: 114.13
TRIGLYCERIDES: 107 mg/dL (ref 0.0–149.0)
Total CHOL/HDL Ratio: 4
VLDL: 21.4 mg/dL (ref 0.0–40.0)

## 2015-11-14 LAB — CBC WITH DIFFERENTIAL/PLATELET
BASOS ABS: 0 10*3/uL (ref 0.0–0.1)
Basophils Relative: 0.3 % (ref 0.0–3.0)
EOS ABS: 0.2 10*3/uL (ref 0.0–0.7)
EOS PCT: 2.8 % (ref 0.0–5.0)
HCT: 44.2 % (ref 39.0–52.0)
Hemoglobin: 14.8 g/dL (ref 13.0–17.0)
LYMPHS ABS: 2.9 10*3/uL (ref 0.7–4.0)
LYMPHS PCT: 35 % (ref 12.0–46.0)
MCHC: 33.5 g/dL (ref 30.0–36.0)
MCV: 92.2 fl (ref 78.0–100.0)
Monocytes Absolute: 1 10*3/uL (ref 0.1–1.0)
Monocytes Relative: 11.6 % (ref 3.0–12.0)
NEUTROS ABS: 4.1 10*3/uL (ref 1.4–7.7)
NEUTROS PCT: 50.3 % (ref 43.0–77.0)
PLATELETS: 275 10*3/uL (ref 150.0–400.0)
RBC: 4.79 Mil/uL (ref 4.22–5.81)
RDW: 12.9 % (ref 11.5–15.5)
WBC: 8.2 10*3/uL (ref 4.0–10.5)

## 2015-11-14 LAB — TSH: TSH: 0.99 u[IU]/mL (ref 0.35–4.50)

## 2015-11-14 MED ORDER — OXYCODONE-ACETAMINOPHEN 10-325 MG PO TABS: 1.0000 | ORAL_TABLET | ORAL | Status: DC | PRN

## 2015-11-14 MED ORDER — CLONAZEPAM 2 MG PO TABS: ORAL_TABLET | ORAL | Status: DC

## 2015-11-14 NOTE — Patient Instructions (Signed)

## 2015-11-14 NOTE — Progress Notes (Signed)
Pre visit review using our clinic review tool, if applicable. No additional management support is needed unless otherwise documented below in the visit note. 

## 2015-11-14 NOTE — Progress Notes (Signed)
Subjective:  Patient ID: Jonathan Luna, male    DOB: [birth year]  Age: 50 y.o. MRN: [id number]  CC: Hypothyroidism and Back Pain   HPI Jonathan Luna presents for follow-up, he offers no new symptoms today, he requests refills on clonazepam and Percocet for anxiety and low back pain respectively.  Outpatient Prescriptions Prior to Visit  Medication Sig Dispense Refill  . aspirin 81 MG tablet Take 81 mg by mouth daily.    Marland Kitchen levothyroxine (SYNTHROID) 175 MCG tablet Take 1 tablet (175 mcg total) by mouth daily before breakfast. 90 tablet 1  . mometasone (NASONEX) 50 MCG/ACT nasal spray Place 2 sprays into the nose daily. 17 g 12  . Multiple Vitamin (MULTIVITAMIN) tablet Take 1 tablet by mouth daily.      . clonazePAM (KLONOPIN) 2 MG tablet TID as needed for anxiety 90 tablet 5  . oxyCODONE-acetaminophen (PERCOCET) 10-325 MG tablet Take 1 tablet by mouth every 4 (four) hours as needed for pain. 100 tablet 0   No facility-administered medications prior to visit.    ROS Review of Systems  Constitutional: Negative.  Negative for fever, chills, diaphoresis, appetite change and fatigue.  HENT: Negative.   Eyes: Negative.   Respiratory: Negative.  Negative for cough, choking, chest tightness, shortness of breath and stridor.   Cardiovascular: Negative.  Negative for chest pain, palpitations and leg swelling.  Gastrointestinal: Negative.  Negative for nausea, vomiting, abdominal pain, diarrhea, constipation and blood in stool.  Endocrine: Negative.   Genitourinary: Negative.  Negative for difficulty urinating.  Musculoskeletal: Positive for back pain. Negative for myalgias, joint swelling and arthralgias.       He has chronic, intermittent aching in his lower back with no radiation. He denies numbness weakness or tingling in his legs or feet.  Skin: Negative.  Negative for color change and rash.  Allergic/Immunologic: Negative.   Neurological: Negative.   Hematological: Negative.  Negative for  adenopathy. Does not bruise/bleed easily.  Psychiatric/Behavioral: Negative for suicidal ideas, behavioral problems, sleep disturbance and dysphoric mood. The patient is nervous/anxious.     Objective:  BP 120/86 mmHg  Pulse 87  Temp(Src) 97.8 F (36.6 C) (Oral)  Ht 5\' 8"  (1.727 m)  Wt 214 lb (97.07 kg)  BMI 32.55 kg/m2  SpO2 99%  BP Readings from Last 3 Encounters:  11/14/15 120/86  08/14/15 130/84  05/17/15 140/89    Wt Readings from Last 3 Encounters:  11/14/15 214 lb (97.07 kg)  08/14/15 214 lb (97.07 kg)  05/17/15 204 lb (92.534 kg)    Physical Exam  Constitutional: He is oriented to person, place, and time. No distress.  HENT:  Nose: Nose normal.  Mouth/Throat: Oropharynx is clear and moist. No oropharyngeal exudate.  Eyes: Conjunctivae are normal. Right eye exhibits no discharge. Left eye exhibits no discharge. No scleral icterus.  Neck: Normal range of motion. Neck supple. No JVD present. No tracheal deviation present. No thyromegaly present.  Cardiovascular: Normal rate, regular rhythm, normal heart sounds and intact distal pulses.  Exam reveals no gallop and no friction rub.   No murmur heard. Pulmonary/Chest: Effort normal and breath sounds normal. No stridor. No respiratory distress. He has no wheezes. He has no rales. He exhibits no tenderness.  Abdominal: Soft. Bowel sounds are normal. He exhibits no distension and no mass. There is no tenderness. There is no rebound and no guarding.  Musculoskeletal: Normal range of motion. He exhibits no edema or tenderness.  Lymphadenopathy:    He has no  cervical adenopathy.  Neurological: He is alert and oriented to person, place, and time. He displays no atrophy, no tremor and normal reflexes. No cranial nerve deficit or sensory deficit. He exhibits normal muscle tone. He displays no seizure activity. Coordination normal.  Reflex Scores:      Tricep reflexes are 1+ on the right side and 1+ on the left side.      Bicep  reflexes are 1+ on the right side and 1+ on the left side.      Brachioradialis reflexes are 1+ on the right side and 1+ on the left side.      Patellar reflexes are 1+ on the right side and 1+ on the left side.      Achilles reflexes are 1+ on the right side and 1+ on the left side. Neg SLR in BLE  Skin: Skin is warm and dry. No rash noted. He is not diaphoretic. No erythema. No pallor.  Vitals reviewed.   Lab Results  Component Value Date   WBC 8.2 11/14/2015   HGB 14.8 11/14/2015   HCT 44.2 11/14/2015   PLT 275.0 11/14/2015   GLUCOSE 85 05/17/2015   CHOL 150 11/14/2015   TRIG 107.0 11/14/2015   HDL 35.80* 11/14/2015   LDLDIRECT 124.0 02/15/2015   LDLCALC 93 11/14/2015   ALT 26 05/24/2014   AST 20 05/24/2014   NA 136 05/17/2015   K 4.5 05/17/2015   CL 101 05/17/2015   CREATININE 1.04 05/17/2015   BUN 15 05/17/2015   CO2 27 05/17/2015   TSH 0.99 11/14/2015   PSA 0.85 07/28/2012   HGBA1C 5.2 07/28/2012    Dg Chest 2 View  02/22/2014  CLINICAL DATA:  Cough and sore throat for 5 days. EXAM: CHEST  2 VIEW COMPARISON:  PA and lateral chest 04/16/2007. FINDINGS: The lungs are clear. Heart size is normal. No pneumothorax or pleural effusion. IMPRESSION: Negative chest. Electronically Signed   By: Inge Rise M.D.   On: 02/22/2014 13:47    Assessment & Plan:   Jonathan Luna was seen today for hypothyroidism and back pain.  Diagnoses and all orders for this visit:  Other specified hypothyroidism- his TSH is in the normal range, he will remain on the current dose -     Lipid panel; Future -     TSH; Future -     CBC with Differential/Platelet; Future  Hyperlipidemia with target LDL less than 130- he is achieved his LDL goal is doing well on the statin. -     Lipid panel; Future -     TSH; Future  Anxiety state -     clonazePAM (KLONOPIN) 2 MG tablet; TID as needed for anxiety  PANIC DISORDER -     clonazePAM (KLONOPIN) 2 MG tablet; TID as needed for anxiety  Midline low  back pain with left-sided sciatica -     Discontinue: oxyCODONE-acetaminophen (PERCOCET) 10-325 MG tablet; Take 1 tablet by mouth every 4 (four) hours as needed for pain. -     Discontinue: oxyCODONE-acetaminophen (PERCOCET) 10-325 MG tablet; Take 1 tablet by mouth every 4 (four) hours as needed for pain. -     oxyCODONE-acetaminophen (PERCOCET) 10-325 MG tablet; Take 1 tablet by mouth every 4 (four) hours as needed for pain.  Chronic pain disorder -     Discontinue: oxyCODONE-acetaminophen (PERCOCET) 10-325 MG tablet; Take 1 tablet by mouth every 4 (four) hours as needed for pain. -     Discontinue: oxyCODONE-acetaminophen (PERCOCET) 10-325 MG tablet; Take  1 tablet by mouth every 4 (four) hours as needed for pain. -     oxyCODONE-acetaminophen (PERCOCET) 10-325 MG tablet; Take 1 tablet by mouth every 4 (four) hours as needed for pain.  Low back pain without sciatica, unspecified back pain laterality- based on his symptoms there are no alarm features and his neurologic exam is normal, will continue Percocet as needed for pain. -     Discontinue: oxyCODONE-acetaminophen (PERCOCET) 10-325 MG tablet; Take 1 tablet by mouth every 4 (four) hours as needed for pain. -     Discontinue: oxyCODONE-acetaminophen (PERCOCET) 10-325 MG tablet; Take 1 tablet by mouth every 4 (four) hours as needed for pain. -     oxyCODONE-acetaminophen (PERCOCET) 10-325 MG tablet; Take 1 tablet by mouth every 4 (four) hours as needed for pain.  Chronic bilateral low back pain without sciatica  I am having Mr. Riessen maintain his multivitamin, aspirin, mometasone, levothyroxine, clonazePAM, and oxyCODONE-acetaminophen.  Meds ordered this encounter  Medications  . clonazePAM (KLONOPIN) 2 MG tablet    Sig: TID as needed for anxiety    Dispense:  90 tablet    Refill:  5  . DISCONTD: oxyCODONE-acetaminophen (PERCOCET) 10-325 MG tablet    Sig: Take 1 tablet by mouth every 4 (four) hours as needed for pain.    Dispense:  100  tablet    Refill:  0    Fill on or after 11/14/15  . DISCONTD: oxyCODONE-acetaminophen (PERCOCET) 10-325 MG tablet    Sig: Take 1 tablet by mouth every 4 (four) hours as needed for pain.    Dispense:  100 tablet    Refill:  0    Fill on or after 12/12/15  . oxyCODONE-acetaminophen (PERCOCET) 10-325 MG tablet    Sig: Take 1 tablet by mouth every 4 (four) hours as needed for pain.    Dispense:  100 tablet    Refill:  0    Fill on or after 01/12/16     Follow-up: Return in about 4 months (around 03/13/2016).  Scarlette Calico, MD

## 2015-12-07 ENCOUNTER — Encounter: Payer: Self-pay | Admitting: Internal Medicine

## 2016-02-05 ENCOUNTER — Other Ambulatory Visit: Payer: Self-pay | Admitting: Internal Medicine

## 2016-02-07 ENCOUNTER — Ambulatory Visit (INDEPENDENT_AMBULATORY_CARE_PROVIDER_SITE_OTHER): Payer: BLUE CROSS/BLUE SHIELD | Admitting: Internal Medicine

## 2016-02-07 ENCOUNTER — Encounter: Payer: Self-pay | Admitting: Internal Medicine

## 2016-02-07 VITALS — BP 118/80 | HR 69 | Temp 97.8°F | Resp 16 | Ht 68.0 in | Wt 201.0 lb

## 2016-02-07 DIAGNOSIS — J301 Allergic rhinitis due to pollen: Secondary | ICD-10-CM | POA: Diagnosis not present

## 2016-02-07 DIAGNOSIS — G894 Chronic pain syndrome: Secondary | ICD-10-CM

## 2016-02-07 DIAGNOSIS — M545 Low back pain: Secondary | ICD-10-CM

## 2016-02-07 DIAGNOSIS — M5442 Lumbago with sciatica, left side: Secondary | ICD-10-CM | POA: Diagnosis not present

## 2016-02-07 MED ORDER — OXYCODONE-ACETAMINOPHEN 10-325 MG PO TABS: 1.0000 | ORAL_TABLET | ORAL | Status: DC | PRN

## 2016-02-07 MED ORDER — FLUTICASONE PROPIONATE 50 MCG/ACT NA SUSP: 2.0000 | Freq: Every day | NASAL | Status: DC

## 2016-02-07 MED ORDER — CETIRIZINE-PSEUDOEPHEDRINE ER 5-120 MG PO TB12: 1.0000 | ORAL_TABLET | Freq: Two times a day (BID) | ORAL | Status: DC

## 2016-02-07 NOTE — Progress Notes (Signed)
Subjective:  Patient ID: Jonathan Luna, male    DOB: 1966-05-19  Age: 50 y.o. MRN: [id number]  CC: Back Pain and Allergic Rhinitis    HPI Jonathan Luna presents for follow-up on low back pain. He tells me that he takes a Percocet in the morning about 30 minutes before he is able to get up and do his activities of daily living and go to work. He describes a severe aching sensation in his lower back that has worsened over the last few months. He tells me the pain intermittently radiates into his left thigh and he has had a few episodes of numbness in his left foot. He says the pain is exacerbated by any bending or activity. He takes additional doses of Percocet to get through the rest of the day.  He has intentionally lost weight recently with diet and exercise. He also complains of recurrence of nasal allergy symptoms with congestion, sneezing, and runny nose. He wants to restart his prior medications.  Outpatient Prescriptions Prior to Visit  Medication Sig Dispense Refill  . aspirin 81 MG tablet Take 81 mg by mouth daily.    . clonazePAM (KLONOPIN) 2 MG tablet TID as needed for anxiety 90 tablet 5  . levothyroxine (SYNTHROID, LEVOTHROID) 175 MCG tablet TAKE ONE TABLET BY MOUTH ONCE DAILY BEFORE BREAKFAST 90 tablet 3  . Multiple Vitamin (MULTIVITAMIN) tablet Take 1 tablet by mouth daily.      . mometasone (NASONEX) 50 MCG/ACT nasal spray Place 2 sprays into the nose daily. 17 g 12  . oxyCODONE-acetaminophen (PERCOCET) 10-325 MG tablet Take 1 tablet by mouth every 4 (four) hours as needed for pain. 100 tablet 0   No facility-administered medications prior to visit.    ROS Review of Systems  Constitutional: Negative.   HENT: Positive for congestion, postnasal drip and rhinorrhea. Negative for nosebleeds, sinus pressure, sneezing, sore throat, tinnitus, trouble swallowing and voice change.   Eyes: Negative.   Respiratory: Negative.  Negative for cough, choking, chest tightness, shortness of  breath and stridor.   Cardiovascular: Negative.  Negative for chest pain, palpitations and leg swelling.  Gastrointestinal: Negative.  Negative for nausea, vomiting, abdominal pain, diarrhea and constipation.  Endocrine: Negative.   Genitourinary: Negative.   Musculoskeletal: Positive for back pain. Negative for myalgias, joint swelling and arthralgias.  Skin: Negative.   Allergic/Immunologic: Negative.   Neurological: Positive for numbness. Negative for dizziness, tremors, seizures, syncope, facial asymmetry, speech difficulty, weakness, light-headedness and headaches.  Hematological: Negative.  Negative for adenopathy. Does not bruise/bleed easily.  Psychiatric/Behavioral: Negative.     Objective:  BP 118/80 mmHg  Pulse 69  Temp(Src) 97.8 F (36.6 C) (Oral)  Resp 16  Ht 5\' 8"  (1.727 m)  Wt 201 lb (91.173 kg)  BMI 30.57 kg/m2  SpO2 98%  BP Readings from Last 3 Encounters:  02/07/16 118/80  11/14/15 120/86  08/14/15 130/84    Wt Readings from Last 3 Encounters:  02/07/16 201 lb (91.173 kg)  11/14/15 214 lb (97.07 kg)  08/14/15 214 lb (97.07 kg)    Physical Exam  Constitutional: He is oriented to person, place, and time. He appears well-developed and well-nourished. No distress.  HENT:  Head: Normocephalic and atraumatic.  Nose: Mucosal edema present. No rhinorrhea or sinus tenderness. No epistaxis. Right sinus exhibits no maxillary sinus tenderness and no frontal sinus tenderness. Left sinus exhibits no maxillary sinus tenderness and no frontal sinus tenderness.  Mouth/Throat: Oropharynx is clear and moist. No oropharyngeal exudate.  Eyes: Conjunctivae are normal. Right eye exhibits no discharge. Left eye exhibits no discharge. No scleral icterus.  Neck: Normal range of motion. Neck supple. No JVD present. No tracheal deviation present. No thyromegaly present.  Cardiovascular: Normal rate, regular rhythm, normal heart sounds and intact distal pulses.  Exam reveals no  gallop and no friction rub.   No murmur heard. Pulmonary/Chest: Effort normal and breath sounds normal. No stridor. No respiratory distress. He has no wheezes. He has no rales. He exhibits no tenderness.  Abdominal: Soft. Bowel sounds are normal. He exhibits no distension and no mass. There is no tenderness. There is no rebound and no guarding.  Musculoskeletal: Normal range of motion. He exhibits no edema or tenderness.  Lymphadenopathy:    He has no cervical adenopathy.  Neurological: He is alert and oriented to person, place, and time. He has normal strength. He displays no atrophy, no tremor and normal reflexes. No cranial nerve deficit or sensory deficit. He exhibits normal muscle tone. He displays a negative Romberg sign. He displays no seizure activity. Coordination and gait normal. He displays no Babinski's sign on the right side. He displays no Babinski's sign on the left side.  Reflex Scores:      Tricep reflexes are 0 on the right side and 0 on the left side.      Bicep reflexes are 0 on the right side and 0 on the left side.      Brachioradialis reflexes are 0 on the right side and 0 on the left side.      Patellar reflexes are 0 on the right side and 0 on the left side.      Achilles reflexes are 0 on the right side and 0 on the left side. + SLR in LLE  Skin: Skin is warm and dry. No rash noted. He is not diaphoretic. No erythema. No pallor.  Vitals reviewed.   Lab Results  Component Value Date   WBC 8.2 11/14/2015   HGB 14.8 11/14/2015   HCT 44.2 11/14/2015   PLT 275.0 11/14/2015   GLUCOSE 85 05/17/2015   CHOL 150 11/14/2015   TRIG 107.0 11/14/2015   HDL 35.80* 11/14/2015   LDLDIRECT 124.0 02/15/2015   LDLCALC 93 11/14/2015   ALT 26 05/24/2014   AST 20 05/24/2014   NA 136 05/17/2015   K 4.5 05/17/2015   CL 101 05/17/2015   CREATININE 1.04 05/17/2015   BUN 15 05/17/2015   CO2 27 05/17/2015   TSH 0.99 11/14/2015   PSA 0.85 07/28/2012   HGBA1C 5.2 07/28/2012     Dg Chest 2 View  02/22/2014  CLINICAL DATA:  Cough and sore throat for 5 days. EXAM: CHEST  2 VIEW COMPARISON:  PA and lateral chest 04/16/2007. FINDINGS: The lungs are clear. Heart size is normal. No pneumothorax or pleural effusion. IMPRESSION: Negative chest. Electronically Signed   By: Inge Rise M.D.   On: 02/22/2014 13:47    Assessment & Plan:   Jonathan Luna was seen today for back pain and allergic rhinitis .  Diagnoses and all orders for this visit:  Left low back pain, unspecified chronicity, with sciatica presence unspecified- I have ordered an MRI of his lumbosacral spine to screen for spinal stenosis, disc herniation, or nerve impingement, I anticipate referring him to surgery if I discover any of these findings. -     [name]; Future -     Discontinue: oxyCODONE-acetaminophen (PERCOCET) 10-325 MG tablet; Take 1 tablet by  mouth every 4 (four) hours as needed for pain. -     Discontinue: oxyCODONE-acetaminophen (PERCOCET) 10-325 MG tablet; Take 1 tablet by mouth every 4 (four) hours as needed for pain. -     oxyCODONE-acetaminophen (PERCOCET) 10-325 MG tablet; Take 1 tablet by mouth every 4 (four) hours as needed for pain.  Allergic rhinitis due to pollen- will restart his usual meds -     fluticasone (FLONASE) 50 MCG/ACT nasal spray; Place 2 sprays into both nostrils daily. -     cetirizine-pseudoephedrine (ZYRTEC-D ALLERGY & CONGESTION) 5-120 MG tablet; Take 1 tablet by mouth 2 (two) times daily.  Midline low back pain with left-sided sciatica- we will continue Percocet as needed for pain relief.  Chronic pain disorder -     Discontinue: oxyCODONE-acetaminophen (PERCOCET) 10-325 MG tablet; Take 1 tablet by mouth every 4 (four) hours as needed for pain. -     Discontinue: oxyCODONE-acetaminophen (PERCOCET) 10-325 MG tablet; Take 1 tablet by mouth every 4 (four) hours as needed for pain. -     oxyCODONE-acetaminophen (PERCOCET) 10-325 MG tablet; Take 1  tablet by mouth every 4 (four) hours as needed for pain.  Low back pain without sciatica, unspecified back pain laterality  I have discontinued Jonathan Luna mometasone. I am also having him start on fluticasone and cetirizine-pseudoephedrine. Additionally, I am having him maintain his multivitamin, aspirin, clonazePAM, levothyroxine, and oxyCODONE-acetaminophen.  Meds ordered this encounter  Medications  . fluticasone (FLONASE) 50 MCG/ACT nasal spray    Sig: Place 2 sprays into both nostrils daily.    Dispense:  16 g    Refill:  11  . cetirizine-pseudoephedrine (ZYRTEC-D ALLERGY & CONGESTION) 5-120 MG tablet    Sig: Take 1 tablet by mouth 2 (two) times daily.    Dispense:  60 tablet    Refill:  11  . DISCONTD: oxyCODONE-acetaminophen (PERCOCET) 10-325 MG tablet    Sig: Take 1 tablet by mouth every 4 (four) hours as needed for pain.    Dispense:  100 tablet    Refill:  0    Fill on or after 02/07/16  . DISCONTD: oxyCODONE-acetaminophen (PERCOCET) 10-325 MG tablet    Sig: Take 1 tablet by mouth every 4 (four) hours as needed for pain.    Dispense:  100 tablet    Refill:  0    Fill on or after 03/08/16  . oxyCODONE-acetaminophen (PERCOCET) 10-325 MG tablet    Sig: Take 1 tablet by mouth every 4 (four) hours as needed for pain.    Dispense:  100 tablet    Refill:  0    Fill on or after 04/08/16     Follow-up: Return in about 3 months (around 05/08/2016).  Scarlette Calico, MD

## 2016-02-07 NOTE — Progress Notes (Signed)
Pre visit review using our clinic review tool, if applicable. No additional management support is needed unless otherwise documented below in the visit note. 

## 2016-02-07 NOTE — Patient Instructions (Signed)

## 2016-02-11 ENCOUNTER — Ambulatory Visit: Payer: BLUE CROSS/BLUE SHIELD | Admitting: Internal Medicine

## 2016-02-15 ENCOUNTER — Ambulatory Visit
Admission: RE | Admit: 2016-02-15 | Discharge: 2016-02-15 | Disposition: A | Discharge status: Home / Self Care | Payer: BLUE CROSS/BLUE SHIELD | Source: Ambulatory Visit | Attending: Internal Medicine | Admitting: Internal Medicine

## 2016-02-15 DIAGNOSIS — M545 Low back pain: Secondary | ICD-10-CM

## 2016-02-15 DIAGNOSIS — M5126 Other intervertebral disc displacement, lumbar region: Secondary | ICD-10-CM | POA: Diagnosis not present

## 2016-02-16 ENCOUNTER — Encounter: Payer: Self-pay | Admitting: Internal Medicine

## 2016-05-08 ENCOUNTER — Ambulatory Visit (INDEPENDENT_AMBULATORY_CARE_PROVIDER_SITE_OTHER): Payer: BLUE CROSS/BLUE SHIELD | Admitting: Internal Medicine

## 2016-05-08 ENCOUNTER — Other Ambulatory Visit (INDEPENDENT_AMBULATORY_CARE_PROVIDER_SITE_OTHER): Payer: BLUE CROSS/BLUE SHIELD

## 2016-05-08 ENCOUNTER — Encounter: Payer: Self-pay | Admitting: Internal Medicine

## 2016-05-08 VITALS — BP 142/88 | HR 96 | Temp 98.6°F | Resp 16 | Wt 205.0 lb

## 2016-05-08 DIAGNOSIS — M545 Low back pain, unspecified: Secondary | ICD-10-CM | POA: Insufficient documentation

## 2016-05-08 DIAGNOSIS — M5136 Other intervertebral disc degeneration, lumbar region: Secondary | ICD-10-CM

## 2016-05-08 DIAGNOSIS — E039 Hypothyroidism, unspecified: Secondary | ICD-10-CM

## 2016-05-08 DIAGNOSIS — F410 Panic disorder [episodic paroxysmal anxiety] without agoraphobia: Secondary | ICD-10-CM

## 2016-05-08 DIAGNOSIS — G894 Chronic pain syndrome: Secondary | ICD-10-CM

## 2016-05-08 DIAGNOSIS — J301 Allergic rhinitis due to pollen: Secondary | ICD-10-CM

## 2016-05-08 LAB — TSH: TSH: 6.3 u[IU]/mL — AB (ref 0.35–4.50)

## 2016-05-08 MED ORDER — OXYCODONE-ACETAMINOPHEN 10-325 MG PO TABS: 1.0000 | ORAL_TABLET | ORAL | 0 refills | Status: DC | PRN

## 2016-05-08 MED ORDER — FLUTICASONE PROPIONATE 50 MCG/ACT NA SUSP: 2.0000 | Freq: Every day | NASAL | 11 refills | Status: DC

## 2016-05-08 NOTE — Progress Notes (Signed)
Subjective:  Patient ID: Jonathan Luna, male    DOB: [birth year]  Age: 50 y.o. MRN: [id number]  CC: Hypothyroidism and Back Pain   HPI NAZIM SAFKO presents for checkup on hypothyroidism and follow-up on low back pain. He is taking Percocet and is getting adequate relief from his back pain. He had an MRI done a few months ago that showed degenerative disc disease but no other complications. The back pain does not radiate into his lower extremities and he denies paresthesias.  Regarding of hypothyroidism he complains of slight weight gain but otherwise denies any suspicious symptoms such as constipation, edema, or fatigue.  Outpatient Medications Prior to Visit  Medication Sig Dispense Refill  . aspirin 81 MG tablet Take 81 mg by mouth daily.    . cetirizine-pseudoephedrine (ZYRTEC-D ALLERGY & CONGESTION) 5-120 MG tablet Take 1 tablet by mouth 2 (two) times daily. 60 tablet 11  . clonazePAM (KLONOPIN) 2 MG tablet TID as needed for anxiety 90 tablet 5  . Multiple Vitamin (MULTIVITAMIN) tablet Take 1 tablet by mouth daily.      . fluticasone (FLONASE) 50 MCG/ACT nasal spray Place 2 sprays into both nostrils daily. 16 g 11  . levothyroxine (SYNTHROID, LEVOTHROID) 175 MCG tablet TAKE ONE TABLET BY MOUTH ONCE DAILY BEFORE BREAKFAST 90 tablet 3  . oxyCODONE-acetaminophen (PERCOCET) 10-325 MG tablet Take 1 tablet by mouth every 4 (four) hours as needed for pain. 100 tablet 0   No facility-administered medications prior to visit.     ROS Review of Systems  Constitutional: Positive for unexpected weight change. Negative for chills, diaphoresis and fatigue.  HENT: Negative.   Eyes: Negative.  Negative for visual disturbance.  Respiratory: Negative for cough, choking, shortness of breath and stridor.   Cardiovascular: Negative.  Negative for chest pain, palpitations and leg swelling.  Gastrointestinal: Negative for abdominal pain.  Endocrine: Negative.   Genitourinary: Negative.  Negative for  difficulty urinating.  Musculoskeletal: Positive for back pain. Negative for arthralgias, joint swelling, myalgias and neck pain.  Skin: Negative.  Negative for color change and rash.  Allergic/Immunologic: Negative.   Neurological: Negative.  Negative for facial asymmetry, weakness, numbness and headaches.  Hematological: Negative.  Negative for adenopathy. Does not bruise/bleed easily.  Psychiatric/Behavioral: Negative for decreased concentration, dysphoric mood and sleep disturbance. The patient is nervous/anxious.     Objective:  BP (!) 142/88   Pulse 96   Temp 98.6 F (37 C) (Oral)   Resp 16   Wt 205 lb (93 kg)   SpO2 96%   BMI 31.17 kg/m   BP Readings from Last 3 Encounters:  05/08/16 (!) 142/88  02/07/16 118/80  11/14/15 120/86    Wt Readings from Last 3 Encounters:  05/08/16 205 lb (93 kg)  02/07/16 201 lb (91.2 kg)  11/14/15 214 lb (97.1 kg)    Physical Exam  Constitutional: He is oriented to person, place, and time. No distress.  HENT:  Head: Normocephalic and atraumatic.  Mouth/Throat: Oropharynx is clear and moist. No oropharyngeal exudate.  Eyes: Right eye exhibits no discharge. Left eye exhibits no discharge. No scleral icterus.  Neck: Normal range of motion. Neck supple. No JVD present. No tracheal deviation present. No thyromegaly present.  Cardiovascular: Normal rate, regular rhythm, normal heart sounds and intact distal pulses.  Exam reveals no gallop and no friction rub.   No murmur heard. Pulmonary/Chest: Effort normal and breath sounds normal. No stridor. No respiratory distress. He has no wheezes. He has no rales.  He exhibits no tenderness.  Abdominal: Soft. Bowel sounds are normal. He exhibits no distension and no mass. There is no tenderness. There is no rebound and no guarding.  Musculoskeletal: Normal range of motion. He exhibits no edema or tenderness.  Lymphadenopathy:    He has no cervical adenopathy.  Neurological: He is oriented to person,  place, and time. He displays normal reflexes.  Skin: Skin is warm and dry. No rash noted. He is not diaphoretic. No erythema. No pallor.  Vitals reviewed.   Lab Results  Component Value Date   WBC 8.2 11/14/2015   HGB 14.8 11/14/2015   HCT 44.2 11/14/2015   PLT 275.0 11/14/2015   GLUCOSE 85 05/17/2015   CHOL 150 11/14/2015   TRIG 107.0 11/14/2015   HDL 35.80 (L) 11/14/2015   LDLDIRECT 124.0 02/15/2015   LDLCALC 93 11/14/2015   ALT 26 05/24/2014   AST 20 05/24/2014   NA 136 05/17/2015   K 4.5 05/17/2015   CL 101 05/17/2015   CREATININE 1.04 05/17/2015   BUN 15 05/17/2015   CO2 27 05/17/2015   TSH 6.30 (H) 05/08/2016   PSA 0.85 07/28/2012   HGBA1C 5.2 07/28/2012    [name]  Result Date: 02/15/2016 CLINICAL DATA:  50 year old male with chronic symptoms getting worse. Bilateral lower back pain restricting motion. Burning and anterior left high. Initial encounter. EXAM: MRI LUMBAR SPINE WITHOUT CONTRAST TECHNIQUE: Multiplanar, multisequence MR imaging of the lumbar spine was performed. No intravenous contrast was administered. COMPARISON:  No comparison MR. 04/15/2007 CT abdomen and pelvis. FINDINGS: Last fully open disk space is labeled L5-S1. Present examination incorporates from T11 through upper S3 level. Conus upper L1 level. Remote Schmorl's node deformity L4 superior endplate. Questioned left T11 1 cm nonspecific osseous lesion. It is possible this is related to the rib articulation as versus primary osseous lesion. No other worrisome bony lesions or history of cancer to suggest this is a significant finding. Minimal bulge T10-11 level suspected. T11-12 and T12-L1 unremarkable. L1-2:  Minimal retrolisthesis L1.  Minimal bulge. L2-3: Disc degeneration with mild adjacent endplate reactive changes. Bulge. No significant spinal stenosis or foraminal narrowing. L3-4: Minimal to mild bulge. No significant spinal stenosis or foraminal narrowing. L4-5: Moderate facet  degenerative changes. Bulge. Very mild foraminal narrowing without nerve root compression. No significant spinal stenosis. L5-S1:  Mild facet degenerative changes. IMPRESSION: Degenerative changes throughout the lumbar spine most notable L4-5 level however, no significant spinal stenosis, foraminal narrowing or nerve root compression detected. Please see above for further detail. Electronically Signed   By: Genia Del M.D.   On: 02/15/2016 21:35    Assessment & Plan:   Raven was seen today for hypothyroidism and back pain.  Diagnoses and all orders for this visit:  Hypothyroidism, unspecified hypothyroidism type- his TSH is up to 6.3 so I've made an increase in his Synthroid dose. -     TSH; Future -     levothyroxine (SYNTHROID) 200 MCG tablet; Take 1 tablet (200 mcg total) by mouth daily before breakfast.  PANIC DISORDER  DDD (degenerative disc disease), lumbar -     Discontinue: oxyCODONE-acetaminophen (PERCOCET) 10-325 MG tablet; Take 1 tablet by mouth every 4 (four) hours as needed for pain. -     Discontinue: oxyCODONE-acetaminophen (PERCOCET) 10-325 MG tablet; Take 1 tablet by mouth every 4 (four) hours as needed for pain. -     Discontinue: oxyCODONE-acetaminophen (PERCOCET) 10-325 MG tablet; Take 1 tablet by mouth every 4 (four)  hours as needed for pain. -     oxyCODONE-acetaminophen (PERCOCET) 10-325 MG tablet; Take 1 tablet by mouth every 4 (four) hours as needed for pain.  Low back pain, unspecified back pain laterality, with sciatica presence unspecified -     Discontinue: oxyCODONE-acetaminophen (PERCOCET) 10-325 MG tablet; Take 1 tablet by mouth every 4 (four) hours as needed for pain. -     Discontinue: oxyCODONE-acetaminophen (PERCOCET) 10-325 MG tablet; Take 1 tablet by mouth every 4 (four) hours as needed for pain. -     Discontinue: oxyCODONE-acetaminophen (PERCOCET) 10-325 MG tablet; Take 1 tablet by mouth every 4 (four) hours as needed for pain. -      oxyCODONE-acetaminophen (PERCOCET) 10-325 MG tablet; Take 1 tablet by mouth every 4 (four) hours as needed for pain.  Seasonal allergic rhinitis due to pollen -     fluticasone (FLONASE) 50 MCG/ACT nasal spray; Place 2 sprays into both nostrils daily.  Chronic pain disorder  Left low back pain, unspecified chronicity, with sciatica presence unspecified   I have discontinued Mr. Whitehorn levothyroxine. I am also having him start on levothyroxine. Additionally, I am having him maintain his multivitamin, aspirin, clonazePAM, cetirizine-pseudoephedrine, fluticasone, and oxyCODONE-acetaminophen.  Meds ordered this encounter  Medications  . fluticasone (FLONASE) 50 MCG/ACT nasal spray    Sig: Place 2 sprays into both nostrils daily.    Dispense:  16 g    Refill:  11  . DISCONTD: oxyCODONE-acetaminophen (PERCOCET) 10-325 MG tablet    Sig: Take 1 tablet by mouth every 4 (four) hours as needed for pain.    Dispense:  100 tablet    Refill:  0    Fill on or after 05/08/16  . DISCONTD: oxyCODONE-acetaminophen (PERCOCET) 10-325 MG tablet    Sig: Take 1 tablet by mouth every 4 (four) hours as needed for pain.    Dispense:  100 tablet    Refill:  0    Fill on or after 06/08/16  . DISCONTD: oxyCODONE-acetaminophen (PERCOCET) 10-325 MG tablet    Sig: Take 1 tablet by mouth every 4 (four) hours as needed for pain.    Dispense:  100 tablet    Refill:  0    Fill on or after 07/09/16  . oxyCODONE-acetaminophen (PERCOCET) 10-325 MG tablet    Sig: Take 1 tablet by mouth every 4 (four) hours as needed for pain.    Dispense:  100 tablet    Refill:  0    Fill on or after 08/08/16  . levothyroxine (SYNTHROID) 200 MCG tablet    Sig: Take 1 tablet (200 mcg total) by mouth daily before breakfast.    Dispense:  90 tablet    Refill:  1     Follow-up: Return in about 4 months (around 09/08/2016).  Scarlette Calico, MD

## 2016-05-08 NOTE — Patient Instructions (Signed)

## 2016-05-08 NOTE — Progress Notes (Signed)
Pre visit review using our clinic review tool, if applicable. No additional management support is needed unless otherwise documented below in the visit note. 

## 2016-05-09 ENCOUNTER — Encounter: Payer: Self-pay | Admitting: Internal Medicine

## 2016-05-09 MED ORDER — LEVOTHYROXINE SODIUM 200 MCG PO TABS: 200.0000 ug | ORAL_TABLET | Freq: Every day | ORAL | 1 refills | Status: DC

## 2016-05-21 ENCOUNTER — Encounter: Payer: Self-pay | Admitting: Internal Medicine

## 2016-06-23 DIAGNOSIS — Z23 Encounter for immunization: Secondary | ICD-10-CM | POA: Diagnosis not present

## 2016-07-14 ENCOUNTER — Other Ambulatory Visit: Payer: Self-pay | Admitting: Internal Medicine

## 2016-07-14 ENCOUNTER — Other Ambulatory Visit: Payer: Self-pay

## 2016-07-14 DIAGNOSIS — F410 Panic disorder [episodic paroxysmal anxiety] without agoraphobia: Secondary | ICD-10-CM

## 2016-07-14 DIAGNOSIS — F411 Generalized anxiety disorder: Secondary | ICD-10-CM

## 2016-07-14 MED ORDER — CLONAZEPAM 2 MG PO TABS: ORAL_TABLET | ORAL | 2 refills | Status: DC

## 2016-07-14 NOTE — Telephone Encounter (Signed)
RX written 

## 2016-07-14 NOTE — Telephone Encounter (Signed)
Faxed script to [facility].../lmb

## 2016-07-14 NOTE — Telephone Encounter (Signed)
[facility] faxed rq for refill of clonazepam 2 mg tablets.  Please advise.

## 2016-07-14 NOTE — Progress Notes (Signed)
RX written 

## 2016-07-22 ENCOUNTER — Encounter: Payer: Self-pay | Admitting: Internal Medicine

## 2016-07-23 NOTE — Telephone Encounter (Signed)
Cologuard ordered: Order# [id number]

## 2016-09-10 ENCOUNTER — Ambulatory Visit (INDEPENDENT_AMBULATORY_CARE_PROVIDER_SITE_OTHER): Payer: BLUE CROSS/BLUE SHIELD | Admitting: Internal Medicine

## 2016-09-10 ENCOUNTER — Encounter: Payer: Self-pay | Admitting: Internal Medicine

## 2016-09-10 ENCOUNTER — Other Ambulatory Visit (INDEPENDENT_AMBULATORY_CARE_PROVIDER_SITE_OTHER): Payer: BLUE CROSS/BLUE SHIELD

## 2016-09-10 VITALS — BP 140/88 | HR 80 | Temp 98.5°F | Resp 16 | Ht 68.0 in | Wt 214.1 lb

## 2016-09-10 DIAGNOSIS — E039 Hypothyroidism, unspecified: Secondary | ICD-10-CM

## 2016-09-10 DIAGNOSIS — R7989 Other specified abnormal findings of blood chemistry: Secondary | ICD-10-CM | POA: Diagnosis not present

## 2016-09-10 DIAGNOSIS — Z0000 Encounter for general adult medical examination without abnormal findings: Secondary | ICD-10-CM | POA: Diagnosis not present

## 2016-09-10 DIAGNOSIS — E785 Hyperlipidemia, unspecified: Secondary | ICD-10-CM | POA: Diagnosis not present

## 2016-09-10 DIAGNOSIS — M5136 Other intervertebral disc degeneration, lumbar region: Secondary | ICD-10-CM

## 2016-09-10 DIAGNOSIS — E781 Pure hyperglyceridemia: Secondary | ICD-10-CM

## 2016-09-10 LAB — LIPID PANEL
Cholesterol: 245 mg/dL — ABNORMAL HIGH (ref 0–200)
HDL: 50.5 mg/dL (ref >39.00)
NONHDL: 194.14
TRIGLYCERIDES: 365 mg/dL — AB (ref 0.0–149.0)
Total CHOL/HDL Ratio: 5
VLDL: 73 mg/dL — ABNORMAL HIGH (ref 0.0–40.0)

## 2016-09-10 LAB — LDL CHOLESTEROL, DIRECT: LDL DIRECT: 146 mg/dL

## 2016-09-10 LAB — CBC WITH DIFFERENTIAL/PLATELET
BASOS ABS: 0 10*3/uL (ref 0.0–0.1)
Basophils Relative: 0.4 % (ref 0.0–3.0)
Eosinophils Absolute: 0.3 10*3/uL (ref 0.0–0.7)
Eosinophils Relative: 2.5 % (ref 0.0–5.0)
HCT: 41.8 % (ref 39.0–52.0)
Hemoglobin: 14.4 g/dL (ref 13.0–17.0)
LYMPHS ABS: 3.8 10*3/uL (ref 0.7–4.0)
Lymphocytes Relative: 34.5 % (ref 12.0–46.0)
MCHC: 34.3 g/dL (ref 30.0–36.0)
MCV: 89.9 fl (ref 78.0–100.0)
MONO ABS: 0.8 10*3/uL (ref 0.1–1.0)
MONOS PCT: 7.7 % (ref 3.0–12.0)
NEUTROS PCT: 54.9 % (ref 43.0–77.0)
Neutro Abs: 6.1 10*3/uL (ref 1.4–7.7)
Platelets: 293 10*3/uL (ref 150.0–400.0)
RBC: 4.66 Mil/uL (ref 4.22–5.81)
RDW: 12.8 % (ref 11.5–15.5)
WBC: 11.1 10*3/uL — ABNORMAL HIGH (ref 4.0–10.5)

## 2016-09-10 LAB — COMPREHENSIVE METABOLIC PANEL
ALK PHOS: 55 U/L (ref 39–117)
ALT: 56 U/L — AB (ref 0–53)
AST: 29 U/L (ref 0–37)
Albumin: 4.6 g/dL (ref 3.5–5.2)
BILIRUBIN TOTAL: 0.6 mg/dL (ref 0.2–1.2)
BUN: 15 mg/dL (ref 6–23)
CO2: 27 mEq/L (ref 19–32)
Calcium: 9.7 mg/dL (ref 8.4–10.5)
Chloride: 101 mEq/L (ref 96–112)
Creatinine, Ser: 0.96 mg/dL (ref 0.40–1.50)
GFR: 87.99 mL/min (ref >60.00)
GLUCOSE: 89 mg/dL (ref 70–99)
Potassium: 4.2 mEq/L (ref 3.5–5.1)
Sodium: 137 mEq/L (ref 135–145)
TOTAL PROTEIN: 7.6 g/dL (ref 6.0–8.3)

## 2016-09-10 LAB — PSA: PSA: 0.9 ng/mL (ref 0.10–4.00)

## 2016-09-10 LAB — TSH: TSH: 1.05 u[IU]/mL (ref 0.35–4.50)

## 2016-09-10 MED ORDER — OXYCODONE-ACETAMINOPHEN 10-325 MG PO TABS: 1.0000 | ORAL_TABLET | ORAL | 0 refills | Status: DC | PRN

## 2016-09-10 NOTE — Patient Instructions (Signed)

## 2016-09-10 NOTE — Progress Notes (Signed)
Pre visit review using our clinic review tool, if applicable. No additional management support is needed unless otherwise documented below in the visit note. 

## 2016-09-10 NOTE — Progress Notes (Signed)
Subjective:  Patient ID: Jonathan Luna, male    DOB: 12/16/1965  Age: 50 y.o. MRN: [id number]  CC: Annual Exam and Hypothyroidism   HPI Jonathan Luna presents for a CPX.  He complains of persistent aching in his lower back with no radiation into his lower extremities. He gets symptom relief with Percocet. He denies paresthesias in his lower extremities.  He complains of weight gain and is concerned that his thyroid dose may be too low. He denies edema, constipation, changes in his skin or voice.  Outpatient Medications Prior to Visit  Medication Sig Dispense Refill  . aspirin 81 MG tablet Take 81 mg by mouth daily.    . cetirizine-pseudoephedrine (ZYRTEC-D ALLERGY & CONGESTION) 5-120 MG tablet Take 1 tablet by mouth 2 (two) times daily. 60 tablet 11  . clonazePAM (KLONOPIN) 2 MG tablet TID as needed for anxiety 90 tablet 2  . fluticasone (FLONASE) 50 MCG/ACT nasal spray Place 2 sprays into both nostrils daily. 16 g 11  . levothyroxine (SYNTHROID) 200 MCG tablet Take 1 tablet (200 mcg total) by mouth daily before breakfast. 90 tablet 1  . Multiple Vitamin (MULTIVITAMIN) tablet Take 1 tablet by mouth daily.      Marland Kitchen oxyCODONE-acetaminophen (PERCOCET) 10-325 MG tablet Take 1 tablet by mouth every 4 (four) hours as needed for pain. 100 tablet 0   No facility-administered medications prior to visit.     ROS Review of Systems  Constitutional: Positive for unexpected weight change. Negative for appetite change, chills, diaphoresis and fatigue.  HENT: Negative.   Eyes: Negative for visual disturbance.  Respiratory: Negative for cough, chest tightness, shortness of breath and stridor.   Cardiovascular: Negative.  Negative for chest pain, palpitations and leg swelling.  Gastrointestinal: Negative.  Negative for abdominal pain, constipation, diarrhea, nausea and vomiting.  Endocrine: Negative.   Genitourinary: Negative.  Negative for difficulty urinating.  Musculoskeletal: Positive for back pain.  Negative for arthralgias, myalgias and neck pain.  Skin: Negative.  Negative for color change.  Allergic/Immunologic: Negative.   Neurological: Negative.  Negative for dizziness, weakness, numbness and headaches.  Hematological: Negative.  Negative for adenopathy. Does not bruise/bleed easily.  Psychiatric/Behavioral: Negative.     Objective:  BP 140/88 (BP Location: Left Arm, Patient Position: Sitting, Cuff Size: Large)   Pulse 80   Temp 98.5 F (36.9 C) (Oral)   Resp 16   Ht _0  (1.727 m)   Wt 214 lb 1 oz (97.1 kg)   SpO2 95%   BMI 32.55 kg/m   BP Readings from Last 3 Encounters:  09/10/16 140/88  05/08/16 (!) 142/88  02/07/16 118/80    Wt Readings from Last 3 Encounters:  09/10/16 214 lb 1 oz (97.1 kg)  05/08/16 205 lb (93 kg)  02/07/16 201 lb (91.2 kg)    Physical Exam  Constitutional: He is oriented to person, place, and time. No distress.  HENT:  Mouth/Throat: Oropharynx is clear and moist. No oropharyngeal exudate.  Eyes: Conjunctivae are normal. Right eye exhibits no discharge. Left eye exhibits no discharge. No scleral icterus.  Neck: Normal range of motion. Neck supple. No JVD present. No tracheal deviation present. No thyromegaly present.  Cardiovascular: Normal rate, regular rhythm, normal heart sounds and intact distal pulses.  Exam reveals no gallop and no friction rub.   No murmur heard. Pulmonary/Chest: Effort normal and breath sounds normal. No stridor. No respiratory distress. He has no wheezes. He has no rales.  Abdominal: Soft. Bowel sounds are normal.  He exhibits no distension and no mass. There is no tenderness. There is no rebound and no guarding. Hernia confirmed negative in the right inguinal area and confirmed negative in the left inguinal area.  Genitourinary: Rectum normal, prostate normal, testes normal and penis normal. Rectal exam shows no external hemorrhoid, no internal hemorrhoid, no fissure, no mass, no tenderness, anal tone normal and  guaiac negative stool. Prostate is not enlarged and not tender. Right testis shows no mass, no swelling and no tenderness. Right testis is descended. Left testis shows no mass, no swelling and no tenderness. Left testis is descended. Circumcised. No penile erythema or penile tenderness. No discharge found.  Musculoskeletal: Normal range of motion. He exhibits no edema, tenderness or deformity.  Lymphadenopathy:    He has no cervical adenopathy.       Right: No inguinal adenopathy present.       Left: No inguinal adenopathy present.  Neurological: He is oriented to person, place, and time.  Skin: Skin is warm and dry. No rash noted. He is not diaphoretic. No erythema.  Psychiatric: He has a normal mood and affect. His behavior is normal. Judgment and thought content normal.  Vitals reviewed.   Lab Results  Component Value Date   WBC 11.1 (H) 09/10/2016   HGB 14.4 09/10/2016   HCT 41.8 09/10/2016   PLT 293.0 09/10/2016   GLUCOSE 89 09/10/2016   CHOL 245 (H) 09/10/2016   TRIG 365.0 (H) 09/10/2016   HDL 50.50 09/10/2016   LDLDIRECT 146.0 09/10/2016   LDLCALC 93 11/14/2015   ALT 56 (H) 09/10/2016   AST 29 09/10/2016   NA 137 09/10/2016   K 4.2 09/10/2016   CL 101 09/10/2016   CREATININE 0.96 09/10/2016   BUN 15 09/10/2016   CO2 27 09/10/2016   TSH 1.05 09/10/2016   PSA 0.90 09/10/2016   HGBA1C 5.2 07/28/2012    [name]  Result Date: 02/15/2016 CLINICAL DATA:  50 year old male with chronic symptoms getting worse. Bilateral lower back pain restricting motion. Burning and anterior left high. Initial encounter. EXAM: MRI LUMBAR SPINE WITHOUT CONTRAST TECHNIQUE: Multiplanar, multisequence MR imaging of the lumbar spine was performed. No intravenous contrast was administered. COMPARISON:  No comparison MR. 04/15/2007 CT abdomen and pelvis. FINDINGS: Last fully open disk space is labeled L5-S1. Present examination incorporates from T11 through upper S3 level. Conus upper  L1 level. Remote Schmorl's node deformity L4 superior endplate. Questioned left T11 1 cm nonspecific osseous lesion. It is possible this is related to the rib articulation as versus primary osseous lesion. No other worrisome bony lesions or history of cancer to suggest this is a significant finding. Minimal bulge T10-11 level suspected. T11-12 and T12-L1 unremarkable. L1-2:  Minimal retrolisthesis L1.  Minimal bulge. L2-3: Disc degeneration with mild adjacent endplate reactive changes. Bulge. No significant spinal stenosis or foraminal narrowing. L3-4: Minimal to mild bulge. No significant spinal stenosis or foraminal narrowing. L4-5: Moderate facet degenerative changes. Bulge. Very mild foraminal narrowing without nerve root compression. No significant spinal stenosis. L5-S1:  Mild facet degenerative changes. IMPRESSION: Degenerative changes throughout the lumbar spine most notable L4-5 level however, no significant spinal stenosis, foraminal narrowing or nerve root compression detected. Please see above for further detail. Electronically Signed   By: Genia Del M.D.   On: 02/15/2016 21:35    Assessment & Plan:   Jonathan Luna was seen today for annual exam and hypothyroidism.  Diagnoses and all orders for this visit:  Acquired hypothyroidism- his  TSH is in the normal range, he will maintain the current dose of levothyroxine. -     levothyroxine (SYNTHROID) 200 MCG tablet; Take 1 tablet (200 mcg total) by mouth daily before breakfast.  Routine general medical examination at a health care facility- exam completed, labs ordered and reviewed, vaccines reviewed and updated, he has a cologuard kit at home to screen for colon cancer - I have asked him to complete the process, patient education material was given. -     Lipid panel; Future -     Comprehensive metabolic panel; Future -     CBC with Differential/Platelet; Future -     PSA; Future -     TSH; Future  DDD (degenerative disc disease), lumbar -      Discontinue: oxyCODONE-acetaminophen (PERCOCET) 10-325 MG tablet; Take 1 tablet by mouth every 4 (four) hours as needed for pain. -     Discontinue: oxyCODONE-acetaminophen (PERCOCET) 10-325 MG tablet; Take 1 tablet by mouth every 4 (four) hours as needed for pain. -     oxyCODONE-acetaminophen (PERCOCET) 10-325 MG tablet; Take 1 tablet by mouth every 4 (four) hours as needed for pain.  Pure hyperglyceridemia- his triglycerides are over 350, I've asked him to start treating this with omega-3 fish oils. -     Discontinue: Icosapent Ethyl (VASCEPA) 1 g CAPS; Take 2 capsules by mouth 2 (two) times daily.  Hyperlipidemia with target LDL less than 130- his Framingham risk score is slightly elevated at 8%, he is not willing to start a statin.   I have discontinued Mr. Mooneyhan multivitamin. I am also having him maintain his aspirin, cetirizine-pseudoephedrine, fluticasone, clonazePAM, oxyCODONE-acetaminophen, and levothyroxine.  Meds ordered this encounter  Medications  . DISCONTD: oxyCODONE-acetaminophen (PERCOCET) 10-325 MG tablet    Sig: Take 1 tablet by mouth every 4 (four) hours as needed for pain.    Dispense:  100 tablet    Refill:  0    Fill on or after 09/10/16  . DISCONTD: oxyCODONE-acetaminophen (PERCOCET) 10-325 MG tablet    Sig: Take 1 tablet by mouth every 4 (four) hours as needed for pain.    Dispense:  100 tablet    Refill:  0    Fill on or after 10/10/16  . oxyCODONE-acetaminophen (PERCOCET) 10-325 MG tablet    Sig: Take 1 tablet by mouth every 4 (four) hours as needed for pain.    Dispense:  100 tablet    Refill:  0    Fill on or after 11/10/16  . DISCONTD: Icosapent Ethyl (VASCEPA) 1 g CAPS    Sig: Take 2 capsules by mouth 2 (two) times daily.    Dispense:  120 capsule    Refill:  11  . levothyroxine (SYNTHROID) 200 MCG tablet    Sig: Take 1 tablet (200 mcg total) by mouth daily before breakfast.    Dispense:  90 tablet    Refill:  1     Follow-up: Return in about 4  months (around 01/08/2017).  Scarlette Calico, MD

## 2016-09-11 ENCOUNTER — Other Ambulatory Visit: Payer: Self-pay | Admitting: Internal Medicine

## 2016-09-11 ENCOUNTER — Encounter: Payer: Self-pay | Admitting: Internal Medicine

## 2016-09-11 DIAGNOSIS — E781 Pure hyperglyceridemia: Secondary | ICD-10-CM

## 2016-09-11 MED ORDER — ICOSAPENT ETHYL 1 G PO CAPS: 2.0000 | ORAL_CAPSULE | Freq: Two times a day (BID) | ORAL | 11 refills | Status: DC

## 2016-09-11 MED ORDER — LEVOTHYROXINE SODIUM 200 MCG PO TABS: 200.0000 ug | ORAL_TABLET | Freq: Every day | ORAL | 1 refills | Status: DC

## 2016-11-13 ENCOUNTER — Other Ambulatory Visit: Payer: Self-pay | Admitting: Internal Medicine

## 2016-11-13 DIAGNOSIS — E039 Hypothyroidism, unspecified: Secondary | ICD-10-CM

## 2016-12-09 ENCOUNTER — Ambulatory Visit (INDEPENDENT_AMBULATORY_CARE_PROVIDER_SITE_OTHER): Payer: BLUE CROSS/BLUE SHIELD | Admitting: Internal Medicine

## 2016-12-09 ENCOUNTER — Other Ambulatory Visit (INDEPENDENT_AMBULATORY_CARE_PROVIDER_SITE_OTHER): Payer: BLUE CROSS/BLUE SHIELD

## 2016-12-09 ENCOUNTER — Encounter: Payer: Self-pay | Admitting: Internal Medicine

## 2016-12-09 VITALS — BP 138/78 | HR 98 | Temp 98.1°F | Resp 16 | Ht 68.0 in | Wt 214.1 lb

## 2016-12-09 DIAGNOSIS — E039 Hypothyroidism, unspecified: Secondary | ICD-10-CM | POA: Diagnosis not present

## 2016-12-09 DIAGNOSIS — Z1211 Encounter for screening for malignant neoplasm of colon: Secondary | ICD-10-CM

## 2016-12-09 DIAGNOSIS — M5136 Other intervertebral disc degeneration, lumbar region: Secondary | ICD-10-CM

## 2016-12-09 DIAGNOSIS — J301 Allergic rhinitis due to pollen: Secondary | ICD-10-CM | POA: Diagnosis not present

## 2016-12-09 LAB — TSH: TSH: 0.56 u[IU]/mL (ref 0.35–4.50)

## 2016-12-09 MED ORDER — CETIRIZINE-PSEUDOEPHEDRINE ER 5-120 MG PO TB12: 1.0000 | ORAL_TABLET | Freq: Two times a day (BID) | ORAL | 11 refills | Status: DC

## 2016-12-09 MED ORDER — OXYCODONE-ACETAMINOPHEN 10-325 MG PO TABS: 1.0000 | ORAL_TABLET | ORAL | 0 refills | Status: DC | PRN

## 2016-12-09 NOTE — Progress Notes (Signed)
Subjective:  Patient ID: Jonathan Luna, male    DOB: [birth year]  Age: 51 y.o. MRN: [id number]  CC: Hypothyroidism   HPI KRISTIN ROSSITER presents for f/up on HypoT - he has no new or worsening complaints today.  Outpatient Medications Prior to Visit  Medication Sig Dispense Refill  . aspirin 81 MG tablet Take 81 mg by mouth daily.    . clonazePAM (KLONOPIN) 2 MG tablet TID as needed for anxiety 90 tablet 2  . fluticasone (FLONASE) 50 MCG/ACT nasal spray Place 2 sprays into both nostrils daily. 16 g 11  . levothyroxine (SYNTHROID) 200 MCG tablet Take 1 tablet (200 mcg total) by mouth daily before breakfast. 90 tablet 1  . cetirizine-pseudoephedrine (ZYRTEC-D ALLERGY & CONGESTION) 5-120 MG tablet Take 1 tablet by mouth 2 (two) times daily. 60 tablet 11  . oxyCODONE-acetaminophen (PERCOCET) 10-325 MG tablet Take 1 tablet by mouth every 4 (four) hours as needed for pain. 100 tablet 0  . levothyroxine (SYNTHROID, LEVOTHROID) 200 MCG tablet TAKE ONE TABLET BY MOUTH ONCE DAILY BEFORE BREAKFAST 90 tablet 1  . Icosapent Ethyl (VASCEPA) 1 g CAPS Take 2 capsules by mouth 2 (two) times daily. (Patient not taking: Reported on 12/09/2016) 120 capsule 11   No facility-administered medications prior to visit.     ROS Review of Systems  Constitutional: Negative for appetite change, diaphoresis, fatigue and unexpected weight change.  HENT: Positive for congestion, postnasal drip, rhinorrhea and sneezing. Negative for sinus pain, sinus pressure, sore throat, tinnitus and trouble swallowing.   Eyes: Negative.   Respiratory: Negative.  Negative for cough, chest tightness and shortness of breath.   Cardiovascular: Negative for chest pain, palpitations and leg swelling.  Gastrointestinal: Negative.  Negative for abdominal pain, constipation, diarrhea, nausea and vomiting.  Endocrine: Negative.  Negative for cold intolerance and heat intolerance.  Genitourinary: Negative.   Musculoskeletal: Positive for back pain.  Negative for joint swelling, myalgias and neck pain.  Skin: Negative.   Allergic/Immunologic: Negative.   Neurological: Negative.  Negative for dizziness, weakness, numbness and headaches.  Hematological: Negative.  Negative for adenopathy. Does not bruise/bleed easily.  Psychiatric/Behavioral: Negative.     Objective:  BP 138/78 (BP Location: Left Arm, Patient Position: Sitting, Cuff Size: Large)   Pulse 98   Temp 98.1 F (36.7 C) (Oral)   Resp 16   Ht 5\' 8"  (1.727 m)   Wt 214 lb 1.9 oz (97.1 kg)   SpO2 94%   BMI 32.56 kg/m   BP Readings from Last 3 Encounters:  12/09/16 138/78  09/10/16 140/88  05/08/16 (!) 142/88    Wt Readings from Last 3 Encounters:  12/09/16 214 lb 1.9 oz (97.1 kg)  09/10/16 214 lb 1 oz (97.1 kg)  05/08/16 205 lb (93 kg)    Physical Exam  Constitutional: He is oriented to person, place, and time. No distress.  HENT:  Mouth/Throat: Oropharynx is clear and moist. No oropharyngeal exudate.  Eyes: Conjunctivae are normal. Right eye exhibits no discharge. Left eye exhibits no discharge. No scleral icterus.  Neck: Normal range of motion. Neck supple. No JVD present. No tracheal deviation present. No thyromegaly present.  Cardiovascular: Normal rate, regular rhythm, normal heart sounds and intact distal pulses.  Exam reveals no gallop and no friction rub.   No murmur heard. Pulmonary/Chest: Effort normal and breath sounds normal. No stridor. No respiratory distress. He has no wheezes. He has no rales. He exhibits no tenderness.  Abdominal: Soft. Bowel sounds are normal. He  exhibits no distension and no mass. There is no tenderness. There is no rebound and no guarding.  Musculoskeletal: Normal range of motion. He exhibits no edema, tenderness or deformity.  Lymphadenopathy:    He has no cervical adenopathy.  Neurological: He is oriented to person, place, and time.  Skin: Skin is warm and dry. No rash noted. He is not diaphoretic. No erythema. No pallor.    Vitals reviewed.   Lab Results  Component Value Date   WBC 11.1 (H) 09/10/2016   HGB 14.4 09/10/2016   HCT 41.8 09/10/2016   PLT 293.0 09/10/2016   GLUCOSE 89 09/10/2016   CHOL 245 (H) 09/10/2016   TRIG 365.0 (H) 09/10/2016   HDL 50.50 09/10/2016   LDLDIRECT 146.0 09/10/2016   LDLCALC 93 11/14/2015   ALT 56 (H) 09/10/2016   AST 29 09/10/2016   NA 137 09/10/2016   K 4.2 09/10/2016   CL 101 09/10/2016   CREATININE 0.96 09/10/2016   BUN 15 09/10/2016   CO2 27 09/10/2016   TSH 0.56 12/09/2016   PSA 0.90 09/10/2016   HGBA1C 5.2 07/28/2012    [name]  Result Date: 02/15/2016 CLINICAL DATA:  51 year old male with chronic symptoms getting worse. Bilateral lower back pain restricting motion. Burning and anterior left high. Initial encounter. EXAM: MRI LUMBAR SPINE WITHOUT CONTRAST TECHNIQUE: Multiplanar, multisequence MR imaging of the lumbar spine was performed. No intravenous contrast was administered. COMPARISON:  No comparison MR. 04/15/2007 CT abdomen and pelvis. FINDINGS: Last fully open disk space is labeled L5-S1. Present examination incorporates from T11 through upper S3 level. Conus upper L1 level. Remote Schmorl's node deformity L4 superior endplate. Questioned left T11 1 cm nonspecific osseous lesion. It is possible this is related to the rib articulation as versus primary osseous lesion. No other worrisome bony lesions or history of cancer to suggest this is a significant finding. Minimal bulge T10-11 level suspected. T11-12 and T12-L1 unremarkable. L1-2:  Minimal retrolisthesis L1.  Minimal bulge. L2-3: Disc degeneration with mild adjacent endplate reactive changes. Bulge. No significant spinal stenosis or foraminal narrowing. L3-4: Minimal to mild bulge. No significant spinal stenosis or foraminal narrowing. L4-5: Moderate facet degenerative changes. Bulge. Very mild foraminal narrowing without nerve root compression. No significant spinal stenosis. L5-S1:   Mild facet degenerative changes. IMPRESSION: Degenerative changes throughout the lumbar spine most notable L4-5 level however, no significant spinal stenosis, foraminal narrowing or nerve root compression detected. Please see above for further detail. Electronically Signed   By: Genia Del M.D.   On: 02/15/2016 21:35    Assessment & Plan:   Alfred was seen today for hypothyroidism.  Diagnoses and all orders for this visit:  Acquired hypothyroidism- his TSH is in the normal range, will stay on the current dose -     TSH; Future  Acute nonseasonal allergic rhinitis due to pollen -     cetirizine-pseudoephedrine (ZYRTEC-D ALLERGY & CONGESTION) 5-120 MG tablet; Take 1 tablet by mouth 2 (two) times daily.  DDD (degenerative disc disease), lumbar -     Discontinue: oxyCODONE-acetaminophen (PERCOCET) 10-325 MG tablet; Take 1 tablet by mouth every 4 (four) hours as needed for pain. -     Discontinue: oxyCODONE-acetaminophen (PERCOCET) 10-325 MG tablet; Take 1 tablet by mouth every 4 (four) hours as needed for pain. -     Discontinue: oxyCODONE-acetaminophen (PERCOCET) 10-325 MG tablet; Take 1 tablet by mouth every 4 (four) hours as needed for pain. -     oxyCODONE-acetaminophen (PERCOCET) 10-325  MG tablet; Take 1 tablet by mouth every 4 (four) hours as needed for pain.   I have discontinued Mr. Graig Icosapent Ethyl. I am also having him maintain his aspirin, fluticasone, clonazePAM, levothyroxine, levothyroxine, cetirizine-pseudoephedrine, and oxyCODONE-acetaminophen.  Meds ordered this encounter  Medications  . cetirizine-pseudoephedrine (ZYRTEC-D ALLERGY & CONGESTION) 5-120 MG tablet    Sig: Take 1 tablet by mouth 2 (two) times daily.    Dispense:  60 tablet    Refill:  11  . DISCONTD: oxyCODONE-acetaminophen (PERCOCET) 10-325 MG tablet    Sig: Take 1 tablet by mouth every 4 (four) hours as needed for pain.    Dispense:  100 tablet    Refill:  0    Fill on or after 12/09/16  .  DISCONTD: oxyCODONE-acetaminophen (PERCOCET) 10-325 MG tablet    Sig: Take 1 tablet by mouth every 4 (four) hours as needed for pain.    Dispense:  100 tablet    Refill:  0    Fill on or after 01/06/17  . DISCONTD: oxyCODONE-acetaminophen (PERCOCET) 10-325 MG tablet    Sig: Take 1 tablet by mouth every 4 (four) hours as needed for pain.    Dispense:  100 tablet    Refill:  0    Fill on or after 02/06/17  . oxyCODONE-acetaminophen (PERCOCET) 10-325 MG tablet    Sig: Take 1 tablet by mouth every 4 (four) hours as needed for pain.    Dispense:  100 tablet    Refill:  0    Fill on or after 03/08/17     Follow-up: Return in about 4 months (around 04/08/2017).  Scarlette Calico, MD

## 2016-12-09 NOTE — Progress Notes (Signed)
Pre visit review using our clinic review tool, if applicable. No additional management support is needed unless otherwise documented below in the visit note. 

## 2016-12-09 NOTE — Patient Instructions (Signed)

## 2016-12-10 ENCOUNTER — Other Ambulatory Visit: Payer: Self-pay | Admitting: Internal Medicine

## 2016-12-10 ENCOUNTER — Encounter: Payer: Self-pay | Admitting: Internal Medicine

## 2016-12-10 DIAGNOSIS — E781 Pure hyperglyceridemia: Secondary | ICD-10-CM

## 2016-12-10 DIAGNOSIS — Z1211 Encounter for screening for malignant neoplasm of colon: Secondary | ICD-10-CM | POA: Insufficient documentation

## 2016-12-10 MED ORDER — OMEGA-3-ACID ETHYL ESTERS 1 G PO CAPS: 2.0000 g | ORAL_CAPSULE | Freq: Two times a day (BID) | ORAL | 3 refills | Status: DC

## 2017-01-08 ENCOUNTER — Other Ambulatory Visit: Payer: Self-pay | Admitting: Internal Medicine

## 2017-01-08 DIAGNOSIS — F410 Panic disorder [episodic paroxysmal anxiety] without agoraphobia: Secondary | ICD-10-CM

## 2017-01-08 DIAGNOSIS — F411 Generalized anxiety disorder: Secondary | ICD-10-CM

## 2017-01-15 ENCOUNTER — Telehealth: Payer: Self-pay | Admitting: Internal Medicine

## 2017-01-15 NOTE — Telephone Encounter (Signed)
Referral was sent to old office new office is located  [employer] [address], floats to both locations   [employer] [phone] Romelle Starcher [phone]  Call for fax number.

## 2017-01-15 NOTE — Telephone Encounter (Signed)
Is there anything that I need to do?

## 2017-02-05 DIAGNOSIS — Z8371 Family history of colonic polyps: Secondary | ICD-10-CM | POA: Diagnosis not present

## 2017-02-05 DIAGNOSIS — Z1211 Encounter for screening for malignant neoplasm of colon: Secondary | ICD-10-CM | POA: Diagnosis not present

## 2017-03-09 DIAGNOSIS — K635 Polyp of colon: Secondary | ICD-10-CM | POA: Diagnosis not present

## 2017-03-09 DIAGNOSIS — Z1211 Encounter for screening for malignant neoplasm of colon: Secondary | ICD-10-CM | POA: Diagnosis not present

## 2017-03-09 DIAGNOSIS — L237 Allergic contact dermatitis due to plants, except food: Secondary | ICD-10-CM | POA: Diagnosis not present

## 2017-03-10 LAB — HM COLONOSCOPY

## 2017-03-15 DIAGNOSIS — D123 Benign neoplasm of transverse colon: Secondary | ICD-10-CM | POA: Diagnosis not present

## 2017-03-18 DIAGNOSIS — K635 Polyp of colon: Secondary | ICD-10-CM | POA: Diagnosis not present

## 2017-03-19 DIAGNOSIS — K635 Polyp of colon: Secondary | ICD-10-CM | POA: Diagnosis not present

## 2017-04-07 ENCOUNTER — Other Ambulatory Visit (INDEPENDENT_AMBULATORY_CARE_PROVIDER_SITE_OTHER): Payer: BLUE CROSS/BLUE SHIELD

## 2017-04-07 ENCOUNTER — Encounter: Payer: Self-pay | Admitting: Internal Medicine

## 2017-04-07 ENCOUNTER — Ambulatory Visit (INDEPENDENT_AMBULATORY_CARE_PROVIDER_SITE_OTHER): Payer: BLUE CROSS/BLUE SHIELD | Admitting: Internal Medicine

## 2017-04-07 VITALS — BP 120/68 | HR 70 | Temp 98.8°F | Resp 16 | Ht 68.0 in | Wt 199.5 lb

## 2017-04-07 DIAGNOSIS — E781 Pure hyperglyceridemia: Secondary | ICD-10-CM

## 2017-04-07 DIAGNOSIS — M5136 Other intervertebral disc degeneration, lumbar region: Secondary | ICD-10-CM

## 2017-04-07 DIAGNOSIS — Z79899 Other long term (current) drug therapy: Secondary | ICD-10-CM | POA: Diagnosis not present

## 2017-04-07 DIAGNOSIS — E785 Hyperlipidemia, unspecified: Secondary | ICD-10-CM

## 2017-04-07 DIAGNOSIS — E039 Hypothyroidism, unspecified: Secondary | ICD-10-CM | POA: Diagnosis not present

## 2017-04-07 DIAGNOSIS — Z79891 Long term (current) use of opiate analgesic: Secondary | ICD-10-CM | POA: Diagnosis not present

## 2017-04-07 LAB — CBC WITH DIFFERENTIAL/PLATELET
BASOS PCT: 1 % (ref 0.0–3.0)
Basophils Absolute: 0.1 10*3/uL (ref 0.0–0.1)
EOS PCT: 2.5 % (ref 0.0–5.0)
Eosinophils Absolute: 0.2 10*3/uL (ref 0.0–0.7)
HEMATOCRIT: 43.8 % (ref 39.0–52.0)
Hemoglobin: 14.7 g/dL (ref 13.0–17.0)
Lymphocytes Relative: 40.5 % (ref 12.0–46.0)
Lymphs Abs: 3.1 10*3/uL (ref 0.7–4.0)
MCHC: 33.6 g/dL (ref 30.0–36.0)
MCV: 92.1 fl (ref 78.0–100.0)
MONO ABS: 0.8 10*3/uL (ref 0.1–1.0)
Monocytes Relative: 10 % (ref 3.0–12.0)
Neutro Abs: 3.5 10*3/uL (ref 1.4–7.7)
Neutrophils Relative %: 46 % (ref 43.0–77.0)
Platelets: 269 10*3/uL (ref 150.0–400.0)
RBC: 4.75 Mil/uL (ref 4.22–5.81)
RDW: 13.1 % (ref 11.5–15.5)
WBC: 7.7 10*3/uL (ref 4.0–10.5)

## 2017-04-07 MED ORDER — OXYCODONE-ACETAMINOPHEN 10-325 MG PO TABS: 1.0000 | ORAL_TABLET | ORAL | 0 refills | Status: DC | PRN

## 2017-04-07 NOTE — Progress Notes (Signed)
Subjective:  Patient ID: Jonathan Luna, male    DOB: [birth year]  Age: 51 y.o. MRN: [id number]  CC: Hypothyroidism and Hyperlipidemia   HPI Jonathan Luna presents for f/up - He has intentionally lost weight since I last saw him. He has his usual level of nonradiating low back pain and gets symptom relief with Percocet. He also complains of ongoing anxiety that is well-controlled with clonazepam. He is not willing to take an antidepressant such as an SSRI. He offers no new or different complaints today.  Outpatient Medications Prior to Visit  Medication Sig Dispense Refill  . aspirin 81 MG tablet Take 81 mg by mouth daily.    . cetirizine-pseudoephedrine (ZYRTEC-D ALLERGY & CONGESTION) 5-120 MG tablet Take 1 tablet by mouth 2 (two) times daily. 60 tablet 11  . clonazePAM (KLONOPIN) 2 MG tablet TAKE 1 TABLET BY MOUTH 3 TIMES A DAY AS NEEDED FOR ANXIETY 90 tablet 1  . fluticasone (FLONASE) 50 MCG/ACT nasal spray Place 2 sprays into both nostrils daily. 16 g 11  . levothyroxine (SYNTHROID) 200 MCG tablet Take 1 tablet (200 mcg total) by mouth daily before breakfast. 90 tablet 1  . omega-3 acid ethyl esters (LOVAZA) 1 g capsule Take 2 capsules (2 g total) by mouth 2 (two) times daily. 360 capsule 3  . oxyCODONE-acetaminophen (PERCOCET) 10-325 MG tablet Take 1 tablet by mouth every 4 (four) hours as needed for pain. 100 tablet 0  . levothyroxine (SYNTHROID, LEVOTHROID) 200 MCG tablet TAKE ONE TABLET BY MOUTH ONCE DAILY BEFORE BREAKFAST 90 tablet 1   No facility-administered medications prior to visit.     ROS Review of Systems  Constitutional: Negative.  Negative for activity change, appetite change, diaphoresis, fatigue and unexpected weight change.  HENT: Negative.   Eyes: Negative.   Respiratory: Negative.  Negative for cough, chest tightness, shortness of breath and wheezing.   Cardiovascular: Negative.  Negative for chest pain, palpitations and leg swelling.  Gastrointestinal: Negative for  abdominal pain, constipation, diarrhea, nausea and vomiting.  Endocrine: Negative.  Negative for cold intolerance and heat intolerance.  Genitourinary: Negative.  Negative for difficulty urinating and dysuria.  Musculoskeletal: Positive for back pain. Negative for joint swelling, myalgias and neck pain.  Skin: Negative.  Negative for color change.  Allergic/Immunologic: Negative.   Neurological: Negative.  Negative for dizziness, weakness, light-headedness and numbness.  Hematological: Negative for adenopathy. Does not bruise/bleed easily.  Psychiatric/Behavioral: Negative for behavioral problems, confusion, decreased concentration, dysphoric mood, sleep disturbance and suicidal ideas. The patient is nervous/anxious.     Objective:  BP 120/68 (BP Location: Left Arm, Patient Position: Sitting, Cuff Size: Large)   Pulse 70   Temp 98.8 F (37.1 C) (Oral)   Resp 16   Ht 5\' 8"  (1.727 m)   Wt 199 lb 8 oz (90.5 kg)   SpO2 99%   BMI 30.33 kg/m   BP Readings from Last 3 Encounters:  04/07/17 120/68  12/09/16 138/78  09/10/16 140/88    Wt Readings from Last 3 Encounters:  04/07/17 199 lb 8 oz (90.5 kg)  12/09/16 214 lb 1.9 oz (97.1 kg)  09/10/16 214 lb 1 oz (97.1 kg)    Physical Exam  Constitutional: He is oriented to person, place, and time. No distress.  HENT:  Mouth/Throat: Oropharynx is clear and moist. No oropharyngeal exudate.  Eyes: Conjunctivae are normal. Right eye exhibits no discharge. Left eye exhibits no discharge. No scleral icterus.  Neck: Normal range of motion. Neck supple. No  JVD present. No thyromegaly present.  Cardiovascular: Normal rate, regular rhythm and intact distal pulses.  Exam reveals no gallop and no friction rub.   No murmur heard. Pulmonary/Chest: Effort normal and breath sounds normal. No respiratory distress. He has no wheezes. He has no rales. He exhibits no tenderness.  Abdominal: Soft. Bowel sounds are normal. He exhibits no distension and no  mass. There is no tenderness. There is no rebound and no guarding.  Musculoskeletal: Normal range of motion. He exhibits no edema or tenderness.  Lymphadenopathy:    He has no cervical adenopathy.  Neurological: He is alert and oriented to person, place, and time. He has normal reflexes. He displays no atrophy and no tremor. No cranial nerve deficit or sensory deficit. He exhibits normal muscle tone. He displays a negative Romberg sign. He displays no seizure activity. Coordination and gait normal.  Neg SLR in BLE  Skin: Skin is warm and dry. No rash noted. He is not diaphoretic. No erythema. No pallor.  Vitals reviewed.   Lab Results  Component Value Date   WBC 11.1 (H) 09/10/2016   HGB 14.4 09/10/2016   HCT 41.8 09/10/2016   PLT 293.0 09/10/2016   GLUCOSE 89 09/10/2016   CHOL 245 (H) 09/10/2016   TRIG 365.0 (H) 09/10/2016   HDL 50.50 09/10/2016   LDLDIRECT 146.0 09/10/2016   LDLCALC 93 11/14/2015   ALT 56 (H) 09/10/2016   AST 29 09/10/2016   NA 137 09/10/2016   K 4.2 09/10/2016   CL 101 09/10/2016   CREATININE 0.96 09/10/2016   BUN 15 09/10/2016   CO2 27 09/10/2016   TSH 0.56 12/09/2016   PSA 0.90 09/10/2016   HGBA1C 5.2 07/28/2012    [name]  Result Date: 02/15/2016 CLINICAL DATA:  51 year old male with chronic symptoms getting worse. Bilateral lower back pain restricting motion. Burning and anterior left high. Initial encounter. EXAM: MRI LUMBAR SPINE WITHOUT CONTRAST TECHNIQUE: Multiplanar, multisequence MR imaging of the lumbar spine was performed. No intravenous contrast was administered. COMPARISON:  No comparison MR. 04/15/2007 CT abdomen and pelvis. FINDINGS: Last fully open disk space is labeled L5-S1. Present examination incorporates from T11 through upper S3 level. Conus upper L1 level. Remote Schmorl's node deformity L4 superior endplate. Questioned left T11 1 cm nonspecific osseous lesion. It is possible this is related to the rib articulation as  versus primary osseous lesion. No other worrisome bony lesions or history of cancer to suggest this is a significant finding. Minimal bulge T10-11 level suspected. T11-12 and T12-L1 unremarkable. L1-2:  Minimal retrolisthesis L1.  Minimal bulge. L2-3: Disc degeneration with mild adjacent endplate reactive changes. Bulge. No significant spinal stenosis or foraminal narrowing. L3-4: Minimal to mild bulge. No significant spinal stenosis or foraminal narrowing. L4-5: Moderate facet degenerative changes. Bulge. Very mild foraminal narrowing without nerve root compression. No significant spinal stenosis. L5-S1:  Mild facet degenerative changes. IMPRESSION: Degenerative changes throughout the lumbar spine most notable L4-5 level however, no significant spinal stenosis, foraminal narrowing or nerve root compression detected. Please see above for further detail. Electronically Signed   By: Genia Del M.D.   On: 02/15/2016 21:35    Assessment & Plan:   Belen was seen today for hypothyroidism and hyperlipidemia.  Diagnoses and all orders for this visit:  Acquired hypothyroidism- I will recheck his TSH and will adjust his dose if indicated. -     CBC with Differential/Platelet; Future -     TSH; Future  Pure hyperglyceridemia- I  praised him for his lifestyle modifications, I will recheck his triglycerides to see if he is achieved his goal of less than 200 -     Lipid panel; Future  Hyperlipidemia with target LDL less than 130- I will recheck his LDL to see if statin therapy is indicated for CV risk reduction. -     Lipid panel; Future  DDD (degenerative disc disease), lumbar -     Discontinue: oxyCODONE-acetaminophen (PERCOCET) 10-325 MG tablet; Take 1 tablet by mouth every 4 (four) hours as needed for pain. -     Discontinue: oxyCODONE-acetaminophen (PERCOCET) 10-325 MG tablet; Take 1 tablet by mouth every 4 (four) hours as needed for pain. -     Discontinue: oxyCODONE-acetaminophen (PERCOCET) 10-325 MG  tablet; Take 1 tablet by mouth every 4 (four) hours as needed for pain. -     Discontinue: oxyCODONE-acetaminophen (PERCOCET) 10-325 MG tablet; Take 1 tablet by mouth every 4 (four) hours as needed for pain. -     oxyCODONE-acetaminophen (PERCOCET) 10-325 MG tablet; Take 1 tablet by mouth every 4 (four) hours as needed for pain.   I am having Mr. Ulibarri maintain his aspirin, fluticasone, levothyroxine, cetirizine-pseudoephedrine, omega-3 acid ethyl esters, clonazePAM, and oxyCODONE-acetaminophen.  Meds ordered this encounter  Medications  . DISCONTD: oxyCODONE-acetaminophen (PERCOCET) 10-325 MG tablet    Sig: Take 1 tablet by mouth every 4 (four) hours as needed for pain.    Dispense:  100 tablet    Refill:  0    Fill on or after 04/08/17  . DISCONTD: oxyCODONE-acetaminophen (PERCOCET) 10-325 MG tablet    Sig: Take 1 tablet by mouth every 4 (four) hours as needed for pain.    Dispense:  100 tablet    Refill:  0    Fill on or after 03/08/17  . DISCONTD: oxyCODONE-acetaminophen (PERCOCET) 10-325 MG tablet    Sig: Take 1 tablet by mouth every 4 (four) hours as needed for pain.    Dispense:  100 tablet    Refill:  0    Fill on or after 05/08/17  . DISCONTD: oxyCODONE-acetaminophen (PERCOCET) 10-325 MG tablet    Sig: Take 1 tablet by mouth every 4 (four) hours as needed for pain.    Dispense:  100 tablet    Refill:  0    Fill on or after 06/08/17  . oxyCODONE-acetaminophen (PERCOCET) 10-325 MG tablet    Sig: Take 1 tablet by mouth every 4 (four) hours as needed for pain.    Dispense:  100 tablet    Refill:  0    Fill on or after 07/09/17     Follow-up: Return in about 4 months (around 08/07/2017).  Scarlette Calico, MD

## 2017-04-07 NOTE — Patient Instructions (Signed)
Hypothyroidism Hypothyroidism is a disorder of the thyroid. The thyroid is a large gland that is located in the lower front of the neck. The thyroid releases hormones that control how the body works. With hypothyroidism, the thyroid does not make enough of these hormones. What are the causes? Causes of hypothyroidism may include:  Viral infections.  Pregnancy.  Your own defense system (immune system) attacking your thyroid.  Certain medicines.  Birth defects.  Past radiation treatments to your head or neck.  Past treatment with radioactive iodine.  Past surgical removal of part or all of your thyroid.  Problems with the gland that is located in the center of your brain (pituitary).  What are the signs or symptoms? Signs and symptoms of hypothyroidism may include:  Feeling as though you have no energy (lethargy).  Inability to tolerate cold.  Weight gain that is not explained by a change in diet or exercise habits.  Dry skin.  Coarse hair.  Menstrual irregularity.  Slowing of thought processes.  Constipation.  Sadness or depression.  How is this diagnosed? Your health care provider may diagnose hypothyroidism with blood tests and ultrasound tests. How is this treated? Hypothyroidism is treated with medicine that replaces the hormones that your body does not make. After you begin treatment, it may take several weeks for symptoms to go away. Follow these instructions at home:  Take medicines only as directed by your health care provider.  If you start taking any new medicines, tell your health care provider.  Keep all follow-up visits as directed by your health care provider. This is important. As your condition improves, your dosage needs may change. You will need to have blood tests regularly so that your health care provider can watch your condition. Contact a health care provider if:  Your symptoms do not get better with treatment.  You are taking thyroid  replacement medicine and: ? You sweat excessively. ? You have tremors. ? You feel anxious. ? You lose weight rapidly. ? You cannot tolerate heat. ? You have emotional swings. ? You have diarrhea. ? You feel weak. Get help right away if:  You develop chest pain.  You develop an irregular heartbeat.  You develop a rapid heartbeat. This information is not intended to replace advice given to you by your health care provider. Make sure you discuss any questions you have with your health care provider. Document Released: 09/29/2005 Document Revised: 03/06/2016 Document Reviewed: 02/14/2014 Elsevier Interactive Patient Education  2017 [employer]  

## 2017-04-08 ENCOUNTER — Encounter: Payer: Self-pay | Admitting: Internal Medicine

## 2017-04-08 LAB — LIPID PANEL
CHOL/HDL RATIO: 5
Cholesterol: 164 mg/dL (ref 0–200)
HDL: 36.1 mg/dL — ABNORMAL LOW (ref >39.00)
LDL CALC: 106 mg/dL — AB (ref 0–99)
NONHDL: 128.38
Triglycerides: 114 mg/dL (ref 0.0–149.0)
VLDL: 22.8 mg/dL (ref 0.0–40.0)

## 2017-04-08 LAB — TSH: TSH: 0.69 u[IU]/mL (ref 0.35–4.50)

## 2017-04-18 ENCOUNTER — Other Ambulatory Visit: Payer: Self-pay | Admitting: Internal Medicine

## 2017-04-21 ENCOUNTER — Telehealth: Payer: Self-pay

## 2017-04-21 NOTE — Telephone Encounter (Signed)
Per toxicology and PCP instructions - UDS negative for clonzepam so rx dc'ed and will not be restarted.

## 2017-05-05 ENCOUNTER — Other Ambulatory Visit: Payer: Self-pay | Admitting: Internal Medicine

## 2017-05-08 ENCOUNTER — Other Ambulatory Visit: Payer: Self-pay | Admitting: Internal Medicine

## 2017-05-08 NOTE — Telephone Encounter (Signed)
Informed patient that rx was discontinued due to negative for clonazepam. Pt stated that he did not take but only once per day and occasionally twice.   Pt stated that he would send an email regarding same.

## 2017-05-10 ENCOUNTER — Encounter: Payer: Self-pay | Admitting: Internal Medicine

## 2017-05-15 ENCOUNTER — Other Ambulatory Visit: Payer: Self-pay | Admitting: Internal Medicine

## 2017-05-15 DIAGNOSIS — F411 Generalized anxiety disorder: Secondary | ICD-10-CM

## 2017-05-15 MED ORDER — CLONAZEPAM 1 MG PO TABS: 1.0000 mg | ORAL_TABLET | Freq: Two times a day (BID) | ORAL | 3 refills | Status: DC | PRN

## 2017-07-01 DIAGNOSIS — Z23 Encounter for immunization: Secondary | ICD-10-CM | POA: Diagnosis not present

## 2017-08-03 ENCOUNTER — Ambulatory Visit (INDEPENDENT_AMBULATORY_CARE_PROVIDER_SITE_OTHER): Payer: BLUE CROSS/BLUE SHIELD | Admitting: Internal Medicine

## 2017-08-03 ENCOUNTER — Other Ambulatory Visit (INDEPENDENT_AMBULATORY_CARE_PROVIDER_SITE_OTHER): Payer: BLUE CROSS/BLUE SHIELD

## 2017-08-03 ENCOUNTER — Encounter: Payer: Self-pay | Admitting: Internal Medicine

## 2017-08-03 VITALS — BP 136/84 | HR 84 | Temp 97.9°F | Resp 16 | Ht 68.0 in | Wt 202.0 lb

## 2017-08-03 DIAGNOSIS — E039 Hypothyroidism, unspecified: Secondary | ICD-10-CM

## 2017-08-03 DIAGNOSIS — M5136 Other intervertebral disc degeneration, lumbar region: Secondary | ICD-10-CM

## 2017-08-03 DIAGNOSIS — F419 Anxiety disorder, unspecified: Secondary | ICD-10-CM

## 2017-08-03 DIAGNOSIS — J301 Allergic rhinitis due to pollen: Secondary | ICD-10-CM | POA: Diagnosis not present

## 2017-08-03 DIAGNOSIS — F32A Depression, unspecified: Secondary | ICD-10-CM

## 2017-08-03 DIAGNOSIS — Z79891 Long term (current) use of opiate analgesic: Secondary | ICD-10-CM

## 2017-08-03 DIAGNOSIS — F329 Major depressive disorder, single episode, unspecified: Secondary | ICD-10-CM | POA: Diagnosis not present

## 2017-08-03 DIAGNOSIS — Z0001 Encounter for general adult medical examination with abnormal findings: Secondary | ICD-10-CM | POA: Insufficient documentation

## 2017-08-03 LAB — TSH: TSH: 1.43 u[IU]/mL (ref 0.35–4.50)

## 2017-08-03 MED ORDER — OXYCODONE-ACETAMINOPHEN 10-325 MG PO TABS: 1.0000 | ORAL_TABLET | ORAL | 0 refills | Status: DC | PRN

## 2017-08-03 MED ORDER — LEVOTHYROXINE SODIUM 200 MCG PO TABS: 200.0000 ug | ORAL_TABLET | Freq: Every day | ORAL | 1 refills | Status: DC

## 2017-08-03 MED ORDER — LEVOCETIRIZINE DIHYDROCHLORIDE 5 MG PO TABS: 5.0000 mg | ORAL_TABLET | Freq: Every evening | ORAL | 1 refills | Status: DC

## 2017-08-03 MED ORDER — MOMETASONE FUROATE 50 MCG/ACT NA SUSP: 2.0000 | Freq: Every day | NASAL | 12 refills | Status: DC

## 2017-08-03 MED ORDER — VILAZODONE HCL 10 & 20 & 40 MG PO KIT: 1.0000 | PACK | Freq: Every day | ORAL | 0 refills | Status: DC

## 2017-08-03 NOTE — Progress Notes (Signed)
Subjective:  Patient ID: Jonathan Luna, male    DOB: 1965/12/30  Age: 51 y.o. MRN: [id number]  CC: Back Pain and Hypothyroidism   HPI KASEN ADDUCI presents for f/up - he complains of worsening anxiety and panicky feeling and wants to know if he can increase his dose of Klonopin.  He has persistent low back pain that does not radiate into his lower extremities and he denies numbness/weakness/tingling.  He also complains of nasal congestion, sneezing, postnasal drip, and runny nose.  Outpatient Medications Prior to Visit  Medication Sig Dispense Refill  . aspirin 81 MG tablet Take 81 mg by mouth daily.    . clonazePAM (KLONOPIN) 1 MG tablet Take 1 tablet (1 mg total) by mouth 2 (two) times daily as needed for anxiety. 60 tablet 3  . omega-3 acid ethyl esters (LOVAZA) 1 g capsule Take 2 capsules (2 g total) by mouth 2 (two) times daily. 360 capsule 3  . cetirizine-pseudoephedrine (ZYRTEC-D ALLERGY & CONGESTION) 5-120 MG tablet Take 1 tablet by mouth 2 (two) times daily. 60 tablet 11  . fluticasone (FLONASE) 50 MCG/ACT nasal spray Place 2 sprays into both nostrils daily. 16 g 11  . levothyroxine (SYNTHROID) 200 MCG tablet Take 1 tablet (200 mcg total) by mouth daily before breakfast. 90 tablet 1  . oxyCODONE-acetaminophen (PERCOCET) 10-325 MG tablet Take 1 tablet by mouth every 4 (four) hours as needed for pain. 100 tablet 0   No facility-administered medications prior to visit.     ROS Review of Systems  Constitutional: Positive for unexpected weight change (some wt gain). Negative for appetite change, chills, diaphoresis and fatigue.  HENT: Positive for congestion, postnasal drip and rhinorrhea. Negative for facial swelling, nosebleeds, sinus pain, sinus pressure, sore throat and trouble swallowing.   Eyes: Negative.   Respiratory: Negative.  Negative for cough, chest tightness, shortness of breath and wheezing.   Cardiovascular: Negative for chest pain, palpitations and leg swelling.    Gastrointestinal: Negative for abdominal pain, blood in stool, constipation, diarrhea, nausea and vomiting.  Endocrine: Negative.  Negative for cold intolerance and heat intolerance.  Genitourinary: Negative.  Negative for urgency.  Musculoskeletal: Positive for back pain. Negative for arthralgias, joint swelling and myalgias.  Skin: Negative.  Negative for color change and rash.  Allergic/Immunologic: Negative.   Neurological: Negative.  Negative for dizziness, weakness, light-headedness and numbness.  Hematological: Negative for adenopathy. Does not bruise/bleed easily.  Psychiatric/Behavioral: Positive for dysphoric mood. Negative for agitation, confusion, decreased concentration, sleep disturbance and suicidal ideas. The patient is nervous/anxious.     Objective:  BP 136/84 (BP Location: Left Arm, Patient Position: Sitting, Cuff Size: Normal)   Pulse 84   Temp 97.9 F (36.6 C) (Oral)   Ht 5' 8"  (1.727 m)   Wt 202 lb (91.6 kg)   SpO2 97%   BMI 30.71 kg/m   BP Readings from Last 3 Encounters:  08/03/17 136/84  04/07/17 120/68  12/09/16 138/78    Wt Readings from Last 3 Encounters:  08/03/17 202 lb (91.6 kg)  04/07/17 199 lb 8 oz (90.5 kg)  12/09/16 214 lb 1.9 oz (97.1 kg)    Physical Exam  Constitutional: He is oriented to person, place, and time. No distress.  HENT:  Nose: Mucosal edema present. No rhinorrhea. No epistaxis.  Mouth/Throat: Oropharynx is clear and moist. No oropharyngeal exudate.  Eyes: Conjunctivae are normal. Right eye exhibits no discharge. Left eye exhibits no discharge. No scleral icterus.  Neck: Normal range of motion.  Neck supple. No JVD present. No thyromegaly present.  Cardiovascular: Normal rate, regular rhythm and intact distal pulses.  Exam reveals no gallop and no friction rub.   No murmur heard. Pulmonary/Chest: Effort normal and breath sounds normal. No respiratory distress. He has no wheezes. He has no rales. He exhibits no tenderness.   Abdominal: Soft. Bowel sounds are normal. He exhibits no distension and no mass. There is no tenderness. There is no rebound and no guarding.  Musculoskeletal: Normal range of motion. He exhibits no edema, tenderness or deformity.  Lymphadenopathy:    He has no cervical adenopathy.  Neurological: He is alert and oriented to person, place, and time.  Skin: Skin is warm and dry. No rash noted. He is not diaphoretic. No erythema. No pallor.  Psychiatric: He has a normal mood and affect. His behavior is normal. Judgment and thought content normal.  Vitals reviewed.   Lab Results  Component Value Date   WBC 7.7 04/07/2017   HGB 14.7 04/07/2017   HCT 43.8 04/07/2017   PLT 269.0 04/07/2017   GLUCOSE 89 09/10/2016   CHOL 164 04/07/2017   TRIG 114.0 04/07/2017   HDL 36.10 (L) 04/07/2017   LDLDIRECT 146.0 09/10/2016   LDLCALC 106 (H) 04/07/2017   ALT 56 (H) 09/10/2016   AST 29 09/10/2016   NA 137 09/10/2016   K 4.2 09/10/2016   CL 101 09/10/2016   CREATININE 0.96 09/10/2016   BUN 15 09/10/2016   CO2 27 09/10/2016   TSH 1.43 08/03/2017   PSA 0.90 09/10/2016   HGBA1C 5.2 07/28/2012    [name]  Result Date: 02/15/2016 CLINICAL DATA:  51 year old male with chronic symptoms getting worse. Bilateral lower back pain restricting motion. Burning and anterior left high. Initial encounter. EXAM: MRI LUMBAR SPINE WITHOUT CONTRAST TECHNIQUE: Multiplanar, multisequence MR imaging of the lumbar spine was performed. No intravenous contrast was administered. COMPARISON:  No comparison MR. 04/15/2007 CT abdomen and pelvis. FINDINGS: Last fully open disk space is labeled L5-S1. Present examination incorporates from T11 through upper S3 level. Conus upper L1 level. Remote Schmorl's node deformity L4 superior endplate. Questioned left T11 1 cm nonspecific osseous lesion. It is possible this is related to the rib articulation as versus primary osseous lesion. No other worrisome bony lesions  or history of cancer to suggest this is a significant finding. Minimal bulge T10-11 level suspected. T11-12 and T12-L1 unremarkable. L1-2:  Minimal retrolisthesis L1.  Minimal bulge. L2-3: Disc degeneration with mild adjacent endplate reactive changes. Bulge. No significant spinal stenosis or foraminal narrowing. L3-4: Minimal to mild bulge. No significant spinal stenosis or foraminal narrowing. L4-5: Moderate facet degenerative changes. Bulge. Very mild foraminal narrowing without nerve root compression. No significant spinal stenosis. L5-S1:  Mild facet degenerative changes. IMPRESSION: Degenerative changes throughout the lumbar spine most notable L4-5 level however, no significant spinal stenosis, foraminal narrowing or nerve root compression detected. Please see above for further detail. Electronically Signed   By: Genia Del M.D.   On: 02/15/2016 21:35    Assessment & Plan:   Jerral was seen today for back pain and hypothyroidism.  Diagnoses and all orders for this visit:  Encounter for long-term opiate analgesic use-I will check a urine drug screen to confirm compliance with the high risk of opioid medication.  We will also screen for compliance with the benzodiazepine and will screen for substance abuse. -     Pain Mgmt, Profile 8 w/Conf, U; Future  Acquired hypothyroidism-his TSH is  in the normal range.  He will remain -     TSH; Future -     levothyroxine (SYNTHROID) 200 MCG tablet; Take 1 tablet (200 mcg total) by mouth daily before breakfast.  Seasonal allergic rhinitis due to pollen-we will start treating this with a steroid nasal spray and oral antihistamine. -     mometasone (NASONEX) 50 MCG/ACT nasal spray; Place 2 sprays into the nose daily. -     levocetirizine (XYZAL) 5 MG tablet; Take 1 tablet (5 mg total) by mouth every evening.  Anxiety and depression-I told him I am not comfortable increasing his dose of Klonopin but I have asked him to start a serotonin agent to treat the  anxiety and panic. -     Vilazodone HCl (VIIBRYD) 10 & 20 & 40 MG KIT; Take 1 tablet by mouth daily.  DDD (degenerative disc disease), lumbar -     Discontinue: oxyCODONE-acetaminophen (PERCOCET) 10-325 MG tablet; Take 1 tablet by mouth every 4 (four) hours as needed for pain. -     Discontinue: oxyCODONE-acetaminophen (PERCOCET) 10-325 MG tablet; Take 1 tablet by mouth every 4 (four) hours as needed for pain. -     Discontinue: oxyCODONE-acetaminophen (PERCOCET) 10-325 MG tablet; Take 1 tablet by mouth every 4 (four) hours as needed for pain. -     oxyCODONE-acetaminophen (PERCOCET) 10-325 MG tablet; Take 1 tablet by mouth every 4 (four) hours as needed for pain.   I have discontinued Mr. Lalla fluticasone and cetirizine-pseudoephedrine. I am also having him start on mometasone, levocetirizine, and Vilazodone HCl. Additionally, I am having him maintain his aspirin, omega-3 acid ethyl esters, clonazePAM, oxyCODONE-acetaminophen, and levothyroxine.  Meds ordered this encounter  Medications  . mometasone (NASONEX) 50 MCG/ACT nasal spray    Sig: Place 2 sprays into the nose daily.    Dispense:  17 g    Refill:  12  . levocetirizine (XYZAL) 5 MG tablet    Sig: Take 1 tablet (5 mg total) by mouth every evening.    Dispense:  90 tablet    Refill:  1  . Vilazodone HCl (VIIBRYD) 10 & 20 & 40 MG KIT    Sig: Take 1 tablet by mouth daily.    Dispense:  14 kit    Refill:  0  . DISCONTD: oxyCODONE-acetaminophen (PERCOCET) 10-325 MG tablet    Sig: Take 1 tablet by mouth every 4 (four) hours as needed for pain.    Dispense:  100 tablet    Refill:  0    Fill on or after 08/08/17  . DISCONTD: oxyCODONE-acetaminophen (PERCOCET) 10-325 MG tablet    Sig: Take 1 tablet by mouth every 4 (four) hours as needed for pain.    Dispense:  100 tablet    Refill:  0    Fill on or after 09/08/17  . DISCONTD: oxyCODONE-acetaminophen (PERCOCET) 10-325 MG tablet    Sig: Take 1 tablet by mouth every 4 (four) hours  as needed for pain.    Dispense:  100 tablet    Refill:  0    Fill on or after 10/08/17  . oxyCODONE-acetaminophen (PERCOCET) 10-325 MG tablet    Sig: Take 1 tablet by mouth every 4 (four) hours as needed for pain.    Dispense:  100 tablet    Refill:  0    Fill on or after 11/08/17  . levothyroxine (SYNTHROID) 200 MCG tablet    Sig: Take 1 tablet (200 mcg total) by mouth daily before breakfast.  Dispense:  90 tablet    Refill:  1     Follow-up: Return in about 4 months (around 12/04/2017).  Scarlette Calico, MD

## 2017-08-03 NOTE — Patient Instructions (Signed)
Hypothyroidism Hypothyroidism is a disorder of the thyroid. The thyroid is a large gland that is located in the lower front of the neck. The thyroid releases hormones that control how the body works. With hypothyroidism, the thyroid does not make enough of these hormones. What are the causes? Causes of hypothyroidism may include:  Viral infections.  Pregnancy.  Your own defense system (immune system) attacking your thyroid.  Certain medicines.  Birth defects.  Past radiation treatments to your head or neck.  Past treatment with radioactive iodine.  Past surgical removal of part or all of your thyroid.  Problems with the gland that is located in the center of your brain (pituitary).  What are the signs or symptoms? Signs and symptoms of hypothyroidism may include:  Feeling as though you have no energy (lethargy).  Inability to tolerate cold.  Weight gain that is not explained by a change in diet or exercise habits.  Dry skin.  Coarse hair.  Menstrual irregularity.  Slowing of thought processes.  Constipation.  Sadness or depression.  How is this diagnosed? Your health care provider may diagnose hypothyroidism with blood tests and ultrasound tests. How is this treated? Hypothyroidism is treated with medicine that replaces the hormones that your body does not make. After you begin treatment, it may take several weeks for symptoms to go away. Follow these instructions at home:  Take medicines only as directed by your health care provider.  If you start taking any new medicines, tell your health care provider.  Keep all follow-up visits as directed by your health care provider. This is important. As your condition improves, your dosage needs may change. You will need to have blood tests regularly so that your health care provider can watch your condition. Contact a health care provider if:  Your symptoms do not get better with treatment.  You are taking thyroid  replacement medicine and: ? You sweat excessively. ? You have tremors. ? You feel anxious. ? You lose weight rapidly. ? You cannot tolerate heat. ? You have emotional swings. ? You have diarrhea. ? You feel weak. Get help right away if:  You develop chest pain.  You develop an irregular heartbeat.  You develop a rapid heartbeat. This information is not intended to replace advice given to you by your health care provider. Make sure you discuss any questions you have with your health care provider. Document Released: 09/29/2005 Document Revised: 03/06/2016 Document Reviewed: 02/14/2014 Elsevier Interactive Patient Education  2017 [employer]  

## 2017-08-04 ENCOUNTER — Encounter: Payer: Self-pay | Admitting: Internal Medicine

## 2017-08-11 ENCOUNTER — Encounter: Payer: Self-pay | Admitting: Internal Medicine

## 2017-08-11 LAB — PAIN MGMT, PROFILE 8 W/CONF, U
6 ACETYLMORPHINE: NEGATIVE ng/mL (ref <10)
Alcohol Metabolites: POSITIVE ng/mL — AB (ref <500)
Alphahydroxyalprazolam: NEGATIVE ng/mL (ref <25)
Alphahydroxymidazolam: NEGATIVE ng/mL (ref <50)
Alphahydroxytriazolam: NEGATIVE ng/mL (ref <50)
Aminoclonazepam: 735 ng/mL — ABNORMAL HIGH (ref <25)
Amphetamines: NEGATIVE ng/mL (ref <500)
Benzodiazepines: POSITIVE ng/mL — AB (ref <100)
Buprenorphine, Urine: NEGATIVE ng/mL (ref <5)
Cocaine Metabolite: NEGATIVE ng/mL (ref <150)
Codeine: NEGATIVE ng/mL (ref <50)
Creatinine: 149.7 mg/dL
ETHYL GLUCURONIDE (ETG): 16318 ng/mL — AB (ref <500)
Ethyl Sulfate (ETS): 1930 ng/mL — ABNORMAL HIGH (ref <100)
HYDROCODONE: NEGATIVE ng/mL (ref <50)
HYDROXYETHYLFLURAZEPAM: NEGATIVE ng/mL (ref <50)
Hydromorphone: NEGATIVE ng/mL (ref <50)
LORAZEPAM: NEGATIVE ng/mL (ref <50)
MARIJUANA METABOLITE: NEGATIVE ng/mL (ref <20)
MDMA: NEGATIVE ng/mL (ref <500)
MORPHINE: NEGATIVE ng/mL (ref <50)
NORDIAZEPAM: NEGATIVE ng/mL (ref <50)
Norhydrocodone: NEGATIVE ng/mL (ref <50)
Noroxycodone: 13659 ng/mL — ABNORMAL HIGH (ref <50)
OPIATES: NEGATIVE ng/mL (ref <100)
OXYCODONE: 6436 ng/mL — AB (ref <50)
OXYMORPHONE: 2481 ng/mL — AB (ref <50)
Oxazepam: NEGATIVE ng/mL (ref <50)
Oxidant: NEGATIVE ug/mL (ref <200)
Oxycodone: POSITIVE ng/mL — AB (ref <100)
TEMAZEPAM: NEGATIVE ng/mL (ref <50)
pH: 5.84 (ref 4.5–9.0)

## 2017-08-11 NOTE — Telephone Encounter (Signed)
Pt called and would like a call back regarding this, please advise and call back

## 2017-08-11 NOTE — Telephone Encounter (Signed)
Pt is scheduled to see Dr Ronnald Ramp on Tuesday, 08/18/17.

## 2017-08-18 ENCOUNTER — Ambulatory Visit: Payer: BLUE CROSS/BLUE SHIELD | Admitting: Internal Medicine

## 2017-08-18 ENCOUNTER — Encounter: Payer: Self-pay | Admitting: Internal Medicine

## 2017-08-18 VITALS — BP 130/80 | HR 77 | Temp 98.2°F | Resp 16 | Ht 68.0 in | Wt 206.0 lb

## 2017-08-18 DIAGNOSIS — F1010 Alcohol abuse, uncomplicated: Secondary | ICD-10-CM

## 2017-08-18 DIAGNOSIS — F410 Panic disorder [episodic paroxysmal anxiety] without agoraphobia: Secondary | ICD-10-CM

## 2017-08-18 DIAGNOSIS — F419 Anxiety disorder, unspecified: Secondary | ICD-10-CM | POA: Diagnosis not present

## 2017-08-18 DIAGNOSIS — F329 Major depressive disorder, single episode, unspecified: Secondary | ICD-10-CM | POA: Diagnosis not present

## 2017-08-18 DIAGNOSIS — F32A Depression, unspecified: Secondary | ICD-10-CM

## 2017-08-18 NOTE — Patient Instructions (Signed)

## 2017-08-19 ENCOUNTER — Other Ambulatory Visit: Payer: Self-pay | Admitting: Internal Medicine

## 2017-08-19 DIAGNOSIS — F419 Anxiety disorder, unspecified: Principal | ICD-10-CM

## 2017-08-19 DIAGNOSIS — F32A Depression, unspecified: Secondary | ICD-10-CM

## 2017-08-19 DIAGNOSIS — F329 Major depressive disorder, single episode, unspecified: Secondary | ICD-10-CM

## 2017-08-19 MED ORDER — VILAZODONE HCL 40 MG PO TABS: 40.0000 mg | ORAL_TABLET | Freq: Every day | ORAL | 1 refills | Status: DC

## 2017-08-20 DIAGNOSIS — F1010 Alcohol abuse, uncomplicated: Secondary | ICD-10-CM | POA: Insufficient documentation

## 2017-08-20 NOTE — Progress Notes (Signed)
Subjective:  Patient ID: Jonathan Luna, male    DOB: 1966-08-17  Age: 51 y.o. MRN: [id number]  CC: Depression   HPI Jonathan Luna presents for f/up - he complains of persistent episodes of anxiety and panic.  He is taking the Viibryd as directed.  He comes in today because a recent urine drug screen was consistent with his prescription for Percocet and Klonopin but the alarming thing was that his ETG was elevated at 16,318 and his ETS was elevated at 1930.  He admitted to binging on alcohol the night before the test was done.  His wife admits today that she is concerned about his occasional binges on alcohol.  The patient is aware of the dangers of combining an opiate, benzo, and alcohol and that this could cause an accidental overdose.  He does not relate to being an alcoholic or an addict.  He is not willing to consider treatment for alcoholism such as AA.  He is petrified that I am going to take away his opiate and benzo.  He says that he was taught to have catastrophic and nervous thinking by his father who was a Advertising copywriter.  Outpatient Medications Prior to Visit  Medication Sig Dispense Refill  . aspirin 81 MG tablet Take 81 mg by mouth daily.    . clonazePAM (KLONOPIN) 1 MG tablet Take 1 tablet (1 mg total) by mouth 2 (two) times daily as needed for anxiety. 60 tablet 3  . levocetirizine (XYZAL) 5 MG tablet Take 1 tablet (5 mg total) by mouth every evening. 90 tablet 1  . levothyroxine (SYNTHROID) 200 MCG tablet Take 1 tablet (200 mcg total) by mouth daily before breakfast. 90 tablet 1  . mometasone (NASONEX) 50 MCG/ACT nasal spray Place 2 sprays into the nose daily. 17 g 12  . omega-3 acid ethyl esters (LOVAZA) 1 g capsule Take 2 capsules (2 g total) by mouth 2 (two) times daily. 360 capsule 3  . oxyCODONE-acetaminophen (PERCOCET) 10-325 MG tablet Take 1 tablet by mouth every 4 (four) hours as needed for pain. 100 tablet 0  . Vilazodone HCl (VIIBRYD) 10 & 20 & 40 MG KIT Take 1 tablet by  mouth daily. 14 kit 0   No facility-administered medications prior to visit.     ROS Review of Systems  Musculoskeletal: Positive for back pain.  Psychiatric/Behavioral: Positive for dysphoric mood. Negative for agitation, behavioral problems, confusion, decreased concentration, hallucinations, self-injury, sleep disturbance and suicidal ideas. The patient is nervous/anxious. The patient is not hyperactive.   All other systems reviewed and are negative.   Objective:  BP 130/80 (BP Location: Left Arm, Patient Position: Sitting, Cuff Size: Large)   Pulse 77   Temp 98.2 F (36.8 C) (Oral)   Resp 16   Ht 5' 8"  (1.727 m)   Wt 206 lb (93.4 kg)   SpO2 97%   BMI 31.32 kg/m   BP Readings from Last 3 Encounters:  08/18/17 130/80  08/03/17 136/84  04/07/17 120/68    Wt Readings from Last 3 Encounters:  08/18/17 206 lb (93.4 kg)  08/03/17 202 lb (91.6 kg)  04/07/17 199 lb 8 oz (90.5 kg)    Physical Exam  Psychiatric: Judgment normal. His mood appears anxious. His speech is not rapid and/or pressured, not delayed, not tangential and not slurred. He is not agitated, not aggressive, not slowed and not withdrawn. Thought content is not paranoid and not delusional. Cognition and memory are normal. He does not exhibit a  depressed mood. He expresses no homicidal and no suicidal ideation. He expresses no suicidal plans and no homicidal plans. He is communicative. He is attentive.    Lab Results  Component Value Date   WBC 7.7 04/07/2017   HGB 14.7 04/07/2017   HCT 43.8 04/07/2017   PLT 269.0 04/07/2017   GLUCOSE 89 09/10/2016   CHOL 164 04/07/2017   TRIG 114.0 04/07/2017   HDL 36.10 (L) 04/07/2017   LDLDIRECT 146.0 09/10/2016   LDLCALC 106 (H) 04/07/2017   ALT 56 (H) 09/10/2016   AST 29 09/10/2016   NA 137 09/10/2016   K 4.2 09/10/2016   CL 101 09/10/2016   CREATININE 0.96 09/10/2016   BUN 15 09/10/2016   CO2 27 09/10/2016   TSH 1.43 08/03/2017   PSA 0.90 09/10/2016   HGBA1C  5.2 07/28/2012    [name]  Result Date: 02/15/2016 CLINICAL DATA:  51 year old male with chronic symptoms getting worse. Bilateral lower back pain restricting motion. Burning and anterior left high. Initial encounter. EXAM: MRI LUMBAR SPINE WITHOUT CONTRAST TECHNIQUE: Multiplanar, multisequence MR imaging of the lumbar spine was performed. No intravenous contrast was administered. COMPARISON:  No comparison MR. 04/15/2007 CT abdomen and pelvis. FINDINGS: Last fully open disk space is labeled L5-S1. Present examination incorporates from T11 through upper S3 level. Conus upper L1 level. Remote Schmorl's node deformity L4 superior endplate. Questioned left T11 1 cm nonspecific osseous lesion. It is possible this is related to the rib articulation as versus primary osseous lesion. No other worrisome bony lesions or history of cancer to suggest this is a significant finding. Minimal bulge T10-11 level suspected. T11-12 and T12-L1 unremarkable. L1-2:  Minimal retrolisthesis L1.  Minimal bulge. L2-3: Disc degeneration with mild adjacent endplate reactive changes. Bulge. No significant spinal stenosis or foraminal narrowing. L3-4: Minimal to mild bulge. No significant spinal stenosis or foraminal narrowing. L4-5: Moderate facet degenerative changes. Bulge. Very mild foraminal narrowing without nerve root compression. No significant spinal stenosis. L5-S1:  Mild facet degenerative changes. IMPRESSION: Degenerative changes throughout the lumbar spine most notable L4-5 level however, no significant spinal stenosis, foraminal narrowing or nerve root compression detected. Please see above for further detail. Electronically Signed   By: Genia Del M.D.   On: 02/15/2016 21:35    Assessment & Plan:   [name] was seen today for depression.  Diagnoses and all orders for this visit:  Alcohol use disorder, mild, abuse- he agrees to abstain from alcohol intake and he is aware of the dangers of adding  alcohol to opiates and benzos.  He is not willing to attend an [facility] meeting.  Anxiety and depression- I have asked him to start doing some behavioral modification by avoiding catastrophic and nervous thinking.  I also gave him the number of Mardene Sayer - a local psychotherapist who specializes in anxiety and panic and he agrees to see him.  He also agrees to continue taking the Viibryd as directed, I have asked him to try to get up to the 40 mg dose.  PANIC DISORDER- as above   I am having Ruffin Frederick maintain his aspirin, omega-3 acid ethyl esters, clonazePAM, mometasone, levocetirizine, oxyCODONE-acetaminophen, and levothyroxine.  No orders of the defined types were placed in this encounter.    Follow-up: Return in about 3 months (around 11/18/2017).  Scarlette Calico, MD

## 2017-08-21 ENCOUNTER — Encounter: Payer: Self-pay | Admitting: Internal Medicine

## 2017-08-22 ENCOUNTER — Other Ambulatory Visit: Payer: Self-pay | Admitting: Internal Medicine

## 2017-08-24 ENCOUNTER — Other Ambulatory Visit: Payer: Self-pay | Admitting: Internal Medicine

## 2017-08-24 DIAGNOSIS — F32A Depression, unspecified: Secondary | ICD-10-CM

## 2017-08-24 DIAGNOSIS — F329 Major depressive disorder, single episode, unspecified: Secondary | ICD-10-CM

## 2017-08-24 DIAGNOSIS — F419 Anxiety disorder, unspecified: Principal | ICD-10-CM

## 2017-08-24 MED ORDER — ESCITALOPRAM OXALATE 10 MG PO TABS: 10.0000 mg | ORAL_TABLET | Freq: Every day | ORAL | 3 refills | Status: DC

## 2017-09-14 ENCOUNTER — Other Ambulatory Visit: Payer: Self-pay | Admitting: Internal Medicine

## 2017-09-14 DIAGNOSIS — F411 Generalized anxiety disorder: Secondary | ICD-10-CM

## 2017-10-26 ENCOUNTER — Encounter: Payer: Self-pay | Admitting: Internal Medicine

## 2017-10-30 NOTE — Telephone Encounter (Signed)
PA started - Key: Fara Olden

## 2017-12-07 ENCOUNTER — Other Ambulatory Visit (INDEPENDENT_AMBULATORY_CARE_PROVIDER_SITE_OTHER): Payer: BLUE CROSS/BLUE SHIELD

## 2017-12-07 ENCOUNTER — Ambulatory Visit: Payer: BLUE CROSS/BLUE SHIELD | Admitting: Internal Medicine

## 2017-12-07 ENCOUNTER — Encounter: Payer: Self-pay | Admitting: Internal Medicine

## 2017-12-07 ENCOUNTER — Telehealth: Payer: Self-pay

## 2017-12-07 VITALS — BP 118/70 | HR 91 | Temp 97.8°F | Resp 16 | Ht 68.0 in | Wt 209.0 lb

## 2017-12-07 DIAGNOSIS — R7989 Other specified abnormal findings of blood chemistry: Secondary | ICD-10-CM

## 2017-12-07 DIAGNOSIS — E039 Hypothyroidism, unspecified: Secondary | ICD-10-CM

## 2017-12-07 DIAGNOSIS — R945 Abnormal results of liver function studies: Secondary | ICD-10-CM

## 2017-12-07 DIAGNOSIS — Z0000 Encounter for general adult medical examination without abnormal findings: Secondary | ICD-10-CM

## 2017-12-07 DIAGNOSIS — J301 Allergic rhinitis due to pollen: Secondary | ICD-10-CM | POA: Diagnosis not present

## 2017-12-07 DIAGNOSIS — F329 Major depressive disorder, single episode, unspecified: Secondary | ICD-10-CM

## 2017-12-07 DIAGNOSIS — F32A Depression, unspecified: Secondary | ICD-10-CM

## 2017-12-07 DIAGNOSIS — F419 Anxiety disorder, unspecified: Secondary | ICD-10-CM

## 2017-12-07 DIAGNOSIS — M5136 Other intervertebral disc degeneration, lumbar region: Secondary | ICD-10-CM | POA: Diagnosis not present

## 2017-12-07 LAB — CBC WITH DIFFERENTIAL/PLATELET
BASOS PCT: 0.9 % (ref 0.0–3.0)
Basophils Absolute: 0.1 10*3/uL (ref 0.0–0.1)
EOS PCT: 1.6 % (ref 0.0–5.0)
Eosinophils Absolute: 0.1 10*3/uL (ref 0.0–0.7)
HEMATOCRIT: 43.3 % (ref 39.0–52.0)
HEMOGLOBIN: 15 g/dL (ref 13.0–17.0)
Lymphocytes Relative: 32.1 % (ref 12.0–46.0)
Lymphs Abs: 2.7 10*3/uL (ref 0.7–4.0)
MCHC: 34.6 g/dL (ref 30.0–36.0)
MCV: 89.9 fl (ref 78.0–100.0)
MONO ABS: 0.7 10*3/uL (ref 0.1–1.0)
MONOS PCT: 8.4 % (ref 3.0–12.0)
Neutro Abs: 4.9 10*3/uL (ref 1.4–7.7)
Neutrophils Relative %: 57 % (ref 43.0–77.0)
Platelets: 328 10*3/uL (ref 150.0–400.0)
RBC: 4.82 Mil/uL (ref 4.22–5.81)
RDW: 12.7 % (ref 11.5–15.5)
WBC: 8.5 10*3/uL (ref 4.0–10.5)

## 2017-12-07 LAB — COMPREHENSIVE METABOLIC PANEL
ALBUMIN: 4.6 g/dL (ref 3.5–5.2)
ALT: 30 U/L (ref 0–53)
AST: 21 U/L (ref 0–37)
Alkaline Phosphatase: 52 U/L (ref 39–117)
BUN: 12 mg/dL (ref 6–23)
CALCIUM: 10.2 mg/dL (ref 8.4–10.5)
CHLORIDE: 102 meq/L (ref 96–112)
CO2: 28 mEq/L (ref 19–32)
Creatinine, Ser: 0.99 mg/dL (ref 0.40–1.50)
GFR: 84.5 mL/min (ref >60.00)
Glucose, Bld: 89 mg/dL (ref 70–99)
POTASSIUM: 4.6 meq/L (ref 3.5–5.1)
SODIUM: 139 meq/L (ref 135–145)
TOTAL PROTEIN: 7.4 g/dL (ref 6.0–8.3)
Total Bilirubin: 0.5 mg/dL (ref 0.2–1.2)

## 2017-12-07 LAB — TSH: TSH: 0.04 u[IU]/mL — AB (ref 0.35–4.50)

## 2017-12-07 LAB — LDL CHOLESTEROL, DIRECT: Direct LDL: 122 mg/dL

## 2017-12-07 LAB — LIPID PANEL
Cholesterol: 186 mg/dL (ref 0–200)
HDL: 34.7 mg/dL — AB (ref >39.00)
NonHDL: 151.59
TRIGLYCERIDES: 270 mg/dL — AB (ref 0.0–149.0)
Total CHOL/HDL Ratio: 5
VLDL: 54 mg/dL — AB (ref 0.0–40.0)

## 2017-12-07 LAB — GAMMA GT: GGT: 19 U/L (ref 7–51)

## 2017-12-07 LAB — PSA: PSA: 2.36 ng/mL (ref 0.10–4.00)

## 2017-12-07 MED ORDER — OXYCODONE-ACETAMINOPHEN 10-325 MG PO TABS: 1.0000 | ORAL_TABLET | ORAL | 0 refills | Status: DC | PRN

## 2017-12-07 MED ORDER — LEVOTHYROXINE SODIUM 175 MCG PO TABS: 175.0000 ug | ORAL_TABLET | Freq: Every day | ORAL | 0 refills | Status: DC

## 2017-12-07 MED ORDER — VILAZODONE HCL 10 & 20 MG PO KIT: 1.0000 | PACK | Freq: Every day | ORAL | 0 refills | Status: DC

## 2017-12-07 NOTE — Telephone Encounter (Signed)
Viibryd 40 mg   Key: [id number]  Oxycodone 10-325 mg  Key: [employer]

## 2017-12-07 NOTE — Patient Instructions (Signed)

## 2017-12-07 NOTE — Progress Notes (Signed)
Subjective:  Patient ID: Jonathan Luna, male    DOB: 1966-09-23  Age: 52 y.o. MRN: [id number]  CC: Hypothyroidism; Annual Exam; Depression; and Hyperlipidemia   HPI Jonathan Luna presents for a CPX.  He complains that Lexapro caused headache, diarrhea, and jitteriness and wants to try Viibryd again.  Also, we have gotten his insurance company to approve Viibryd.  He complains of persistent episodes of anxiety and panic but he denies hopelessness or helplessness.  When I last saw him there was concern about alcohol abuse and he says he has quit drinking.  He complains of chronic runny nose, nasal congestion, and postnasal drip.  He is not taking Xyzal because he says it made him too sleepy.  He is using the steroid nasal spray.  He wants to be referred to an allergist.  He complains of persistent but unchanged low back pain that occasionally radiates into both thighs.  He denies any recent paresthesias.  He does get symptom relief with oxycodone.  Outpatient Medications Prior to Visit  Medication Sig Dispense Refill  . aspirin 81 MG tablet Take 81 mg by mouth daily.    . clonazePAM (KLONOPIN) 1 MG tablet TAKE ONE TABLET BY MOUTH TWICE DAILY AS NEEDED FOR ANXIETY 60 tablet 2  . levocetirizine (XYZAL) 5 MG tablet Take 1 tablet (5 mg total) by mouth every evening. 90 tablet 1  . mometasone (NASONEX) 50 MCG/ACT nasal spray Place 2 sprays into the nose daily. 17 g 12  . omega-3 acid ethyl esters (LOVAZA) 1 g capsule Take 2 capsules (2 g total) by mouth 2 (two) times daily. 360 capsule 3  . escitalopram (LEXAPRO) 10 MG tablet Take 1 tablet (10 mg total) daily by mouth. 30 tablet 3  . levothyroxine (SYNTHROID) 200 MCG tablet Take 1 tablet (200 mcg total) by mouth daily before breakfast. 90 tablet 1  . oxyCODONE-acetaminophen (PERCOCET) 10-325 MG tablet Take 1 tablet by mouth every 4 (four) hours as needed for pain. 100 tablet 0   No facility-administered medications prior to visit.     ROS Review  of Systems  Constitutional: Positive for unexpected weight change (wt gain). Negative for appetite change, chills, diaphoresis and fatigue.  HENT: Positive for congestion, postnasal drip and rhinorrhea. Negative for facial swelling, sinus pressure, sinus pain, sneezing, sore throat, trouble swallowing and voice change.   Eyes: Negative.  Negative for visual disturbance.  Respiratory: Negative for cough, chest tightness, shortness of breath and wheezing.   Cardiovascular: Negative for chest pain, palpitations and leg swelling.  Gastrointestinal: Negative.  Negative for abdominal pain, constipation, diarrhea, nausea and vomiting.  Endocrine: Negative.  Negative for cold intolerance and heat intolerance.  Genitourinary: Negative.  Negative for difficulty urinating, dysuria, frequency, hematuria and urgency.  Musculoskeletal: Positive for back pain. Negative for joint swelling, myalgias and neck pain.  Skin: Negative.  Negative for color change and pallor.  Allergic/Immunologic: Negative.   Neurological: Negative.  Negative for dizziness, weakness, light-headedness, numbness and headaches.  Hematological: Negative for adenopathy. Does not bruise/bleed easily.  Psychiatric/Behavioral: Positive for dysphoric mood. Negative for agitation, behavioral problems, confusion, decreased concentration, hallucinations, self-injury, sleep disturbance and suicidal ideas. The patient is nervous/anxious. The patient is not hyperactive.     Objective:  BP 118/70 (BP Location: Left Arm, Patient Position: Sitting, Cuff Size: Large)   Pulse 91   Temp 97.8 F (36.6 C) (Oral)   Resp 16   Ht _0  (1.727 m)   Wt 209 lb (94.8 kg)  SpO2 98%   BMI 31.78 kg/m   BP Readings from Last 3 Encounters:  12/07/17 118/70  08/18/17 130/80  08/03/17 136/84    Wt Readings from Last 3 Encounters:  12/07/17 209 lb (94.8 kg)  08/18/17 206 lb (93.4 kg)  08/03/17 202 lb (91.6 kg)    Physical Exam  Constitutional: He is  oriented to person, place, and time. No distress.  HENT:  Nose: Mucosal edema present. No rhinorrhea. No epistaxis. Right sinus exhibits no maxillary sinus tenderness and no frontal sinus tenderness. Left sinus exhibits no maxillary sinus tenderness and no frontal sinus tenderness.  Mouth/Throat: Oropharynx is clear and moist. No oropharyngeal exudate.  Eyes: Conjunctivae are normal. Right eye exhibits no discharge. Left eye exhibits no discharge. No scleral icterus.  Neck: Normal range of motion. Neck supple. No JVD present. No thyromegaly present.  Cardiovascular: Normal rate, regular rhythm and normal heart sounds. Exam reveals no gallop and no friction rub.  No murmur heard. Pulmonary/Chest: Effort normal and breath sounds normal. No stridor. No respiratory distress. He has no wheezes. He has no rales.  Abdominal: Soft. Bowel sounds are normal. He exhibits no distension and no mass. There is no rebound and no guarding.  Genitourinary:  Genitourinary Comments: He was not willing to undress for a GU or rectal exam  Musculoskeletal: Normal range of motion. He exhibits no edema, tenderness or deformity.  Lymphadenopathy:    He has no cervical adenopathy.  Neurological: He is alert and oriented to person, place, and time.  Skin: Skin is warm. No rash noted. He is not diaphoretic. No erythema. No pallor.  Psychiatric: He has a normal mood and affect. His behavior is normal. Judgment and thought content normal.  Vitals reviewed.   Lab Results  Component Value Date   WBC 8.5 12/07/2017   HGB 15.0 12/07/2017   HCT 43.3 12/07/2017   PLT 328.0 12/07/2017   GLUCOSE 89 12/07/2017   CHOL 186 12/07/2017   TRIG 270.0 (H) 12/07/2017   HDL 34.70 (L) 12/07/2017   LDLDIRECT 122.0 12/07/2017   LDLCALC 106 (H) 04/07/2017   ALT 30 12/07/2017   AST 21 12/07/2017   NA 139 12/07/2017   K 4.6 12/07/2017   CL 102 12/07/2017   CREATININE 0.99 12/07/2017   BUN 12 12/07/2017   CO2 28 12/07/2017   TSH  0.04 (L) 12/07/2017   PSA 2.36 12/07/2017   HGBA1C 5.2 07/28/2012    [name]  Result Date: 02/15/2016 CLINICAL DATA:  52 year old male with chronic symptoms getting worse. Bilateral lower back pain restricting motion. Burning and anterior left high. Initial encounter. EXAM: MRI LUMBAR SPINE WITHOUT CONTRAST TECHNIQUE: Multiplanar, multisequence MR imaging of the lumbar spine was performed. No intravenous contrast was administered. COMPARISON:  No comparison MR. 04/15/2007 CT abdomen and pelvis. FINDINGS: Last fully open disk space is labeled L5-S1. Present examination incorporates from T11 through upper S3 level. Conus upper L1 level. Remote Schmorl's node deformity L4 superior endplate. Questioned left T11 1 cm nonspecific osseous lesion. It is possible this is related to the rib articulation as versus primary osseous lesion. No other worrisome bony lesions or history of cancer to suggest this is a significant finding. Minimal bulge T10-11 level suspected. T11-12 and T12-L1 unremarkable. L1-2:  Minimal retrolisthesis L1.  Minimal bulge. L2-3: Disc degeneration with mild adjacent endplate reactive changes. Bulge. No significant spinal stenosis or foraminal narrowing. L3-4: Minimal to mild bulge. No significant spinal stenosis or foraminal narrowing. L4-5: Moderate facet  degenerative changes. Bulge. Very mild foraminal narrowing without nerve root compression. No significant spinal stenosis. L5-S1:  Mild facet degenerative changes. IMPRESSION: Degenerative changes throughout the lumbar spine most notable L4-5 level however, no significant spinal stenosis, foraminal narrowing or nerve root compression detected. Please see above for further detail. Electronically Signed   By: Genia Del M.D.   On: 02/15/2016 21:35    Assessment & Plan:   Isa was seen today for hypothyroidism, annual exam, depression and hyperlipidemia.  Diagnoses and all orders for this visit:  Acquired  hypothyroidism-his TSH is suppressed so I have decreased his dose of levothyroxine. -     TSH; Future -     levothyroxine (SYNTHROID) 175 MCG tablet; Take 1 tablet (175 mcg total) by mouth daily before breakfast.  Routine general medical examination at a health care facility- Exam limited due to his noncompliance but otherwise completed, labs reviewed, vaccines reviewed and updated, screening for colon cancer is up-to-date, patient education material was given. -     Lipid panel; Future -     PSA; Future -     HIV antibody; Future  LFT elevation- His LFTs are normal now.  I attribute this to the fact that he has stopped drinking alcohol.  He is negative for infection with viral hepatitis.  I have asked him to come in and be vaccinated against hepatitis A and B. -     Gamma GT; Future -     Comprehensive metabolic panel; Future -     CBC with Differential/Platelet; Future -     Hepatitis A antibody, total; Future -     Hepatitis B core antibody, total; Future -     Hepatitis B surface antibody; Future -     Hepatitis C antibody; Future  Non-seasonal allergic rhinitis due to pollen -     Ambulatory referral to Allergy  Anxiety and depression- He will restart Viibryd with the starter dose. -     Vilazodone HCl (VIIBRYD STARTER PACK) 10 & 20 MG KIT; Take 1 tablet by mouth daily.  DDD (degenerative disc disease), lumbar -     oxyCODONE-acetaminophen (PERCOCET) 10-325 MG tablet; Take 1 tablet by mouth every 4 (four) hours as needed for pain.   I have discontinued Monish L. Kurka's levothyroxine and escitalopram. I am also having him start on Vilazodone HCl and levothyroxine. Additionally, I am having him maintain his aspirin, omega-3 acid ethyl esters, mometasone, levocetirizine, clonazePAM, and oxyCODONE-acetaminophen.  Meds ordered this encounter  Medications  . Vilazodone HCl (VIIBRYD STARTER PACK) 10 & 20 MG KIT    Sig: Take 1 tablet by mouth daily.    Dispense:  1 kit    Refill:  0  .  oxyCODONE-acetaminophen (PERCOCET) 10-325 MG tablet    Sig: Take 1 tablet by mouth every 4 (four) hours as needed for pain.    Dispense:  100 tablet    Refill:  0  . levothyroxine (SYNTHROID) 175 MCG tablet    Sig: Take 1 tablet (175 mcg total) by mouth daily before breakfast.    Dispense:  90 tablet    Refill:  0     Follow-up: Return in about 4 months (around 04/06/2018).  Scarlette Calico, MD

## 2017-12-08 ENCOUNTER — Encounter: Payer: Self-pay | Admitting: Internal Medicine

## 2017-12-08 LAB — HEPATITIS C ANTIBODY
Hepatitis C Ab: NONREACTIVE
SIGNAL TO CUT-OFF: 0.19 (ref <1.00)

## 2017-12-08 LAB — HEPATITIS A ANTIBODY, TOTAL: Hepatitis A AB,Total: NONREACTIVE

## 2017-12-08 LAB — HEPATITIS B CORE ANTIBODY, TOTAL: Hep B Core Total Ab: NONREACTIVE

## 2017-12-08 LAB — HEPATITIS B SURFACE ANTIBODY,QUALITATIVE: HEP B S AB: REACTIVE — AB

## 2017-12-08 LAB — HIV ANTIBODY (ROUTINE TESTING W REFLEX): HIV: NONREACTIVE

## 2017-12-14 ENCOUNTER — Other Ambulatory Visit: Payer: Self-pay | Admitting: Internal Medicine

## 2017-12-14 DIAGNOSIS — F411 Generalized anxiety disorder: Secondary | ICD-10-CM

## 2017-12-18 ENCOUNTER — Encounter: Payer: Self-pay | Admitting: Internal Medicine

## 2017-12-18 MED ORDER — VILAZODONE HCL 40 MG PO TABS: 40.0000 mg | ORAL_TABLET | Freq: Every day | ORAL | 1 refills | Status: DC

## 2017-12-21 ENCOUNTER — Encounter: Payer: Self-pay | Admitting: Allergy & Immunology

## 2017-12-30 ENCOUNTER — Encounter: Payer: Self-pay | Admitting: Internal Medicine

## 2017-12-30 ENCOUNTER — Other Ambulatory Visit: Payer: Self-pay | Admitting: Internal Medicine

## 2017-12-30 DIAGNOSIS — E781 Pure hyperglyceridemia: Secondary | ICD-10-CM

## 2018-01-05 ENCOUNTER — Other Ambulatory Visit: Payer: Self-pay | Admitting: Internal Medicine

## 2018-01-05 DIAGNOSIS — M5136 Other intervertebral disc degeneration, lumbar region: Secondary | ICD-10-CM

## 2018-01-05 MED ORDER — OXYCODONE-ACETAMINOPHEN 10-325 MG PO TABS: 1.0000 | ORAL_TABLET | ORAL | 0 refills | Status: DC | PRN

## 2018-01-29 ENCOUNTER — Encounter: Payer: Self-pay | Admitting: Internal Medicine

## 2018-02-05 ENCOUNTER — Telehealth: Payer: Self-pay | Admitting: Internal Medicine

## 2018-02-05 ENCOUNTER — Encounter: Payer: Self-pay | Admitting: Internal Medicine

## 2018-02-05 DIAGNOSIS — M5136 Other intervertebral disc degeneration, lumbar region: Secondary | ICD-10-CM

## 2018-02-05 MED ORDER — OXYCODONE-ACETAMINOPHEN 10-325 MG PO TABS: 1.0000 | ORAL_TABLET | ORAL | 0 refills | Status: DC | PRN

## 2018-02-05 NOTE — Telephone Encounter (Signed)
Pt informed rx was sent in.  

## 2018-02-05 NOTE — Telephone Encounter (Signed)
Copied from [place] [id number]. Topic: Quick Communication - See Telephone Encounter >> Feb 05, 2018 11:06 AM Ether Griffins B wrote: CRM for notification. See Telephone encounter for: 02/05/18.  Pt is inquiring about the PA on the oxycodone. He has sent a [employer] as well. Pt states he is due for refill tomorrow and hasnt heard anything from the office in regards to the PA. He would like to know the status of it.

## 2018-02-05 NOTE — Telephone Encounter (Signed)
Key: [facility]  PA has been approved.

## 2018-02-05 NOTE — Telephone Encounter (Signed)
Pt requesting refill for Oxycodone. Please advise in PCP absence.

## 2018-02-05 NOTE — Telephone Encounter (Signed)
Ok Thx 

## 2018-02-07 ENCOUNTER — Other Ambulatory Visit: Payer: Self-pay | Admitting: Internal Medicine

## 2018-02-17 ENCOUNTER — Telehealth: Payer: Self-pay | Admitting: Internal Medicine

## 2018-02-17 NOTE — Telephone Encounter (Signed)
Pt just had a refill on 02/05/18 need to check w/[facility] was sent to them. No additional refills can be given at this time.Marland KitchenJohny Chess

## 2018-02-17 NOTE — Telephone Encounter (Signed)
Copied from [place] [id number]. Topic: Quick Communication - Rx Refill/Question >> Feb 17, 2018  3:00 PM Ether Griffins B wrote: Medication: oxyCODONE-acetaminophen (PERCOCET) 10-325 MG tablet   Pt was calling to get a follow up appt with Dr. Ronnald Ramp to get his refill. Pts refill is due 03/08/18. He has made an appt for 03/15/18 for the follow up.   Has the patient contacted their pharmacy? Yes.   (Agent: If no, request that the patient contact the pharmacy for the refill.) Preferred Pharmacy (with phone number or street name): [facility] # [place], [place] Agent: Please be advised that RX refills may take up to 3 business days. We ask that you follow-up with your pharmacy.

## 2018-03-02 ENCOUNTER — Telehealth: Payer: Self-pay | Admitting: Internal Medicine

## 2018-03-02 ENCOUNTER — Encounter: Payer: Self-pay | Admitting: Internal Medicine

## 2018-03-02 NOTE — Telephone Encounter (Signed)
Pt rx for oxy will be due on 03/08/2018 (office will be closed that day). Pt has an appt for 03/15/2018.  Will pt be able to have a refill before the end of the week? Please advise.

## 2018-03-02 NOTE — Telephone Encounter (Signed)
Copied from [place] [id number]. Topic: Quick Communication - See Telephone Encounter >> Mar 02, 2018  3:23 PM Bea Graff, NT wrote: CRM for notification. See Telephone encounter for: 03/02/18. Pt would like Stephanie to give him a call regarding his oxycodone.

## 2018-03-02 NOTE — Telephone Encounter (Signed)
Key: [id number]

## 2018-03-05 NOTE — Telephone Encounter (Signed)
Pt is calling about rx, he would like to know if he can get the rx today.  cb is [phone]

## 2018-03-08 ENCOUNTER — Other Ambulatory Visit: Payer: Self-pay | Admitting: Internal Medicine

## 2018-03-08 DIAGNOSIS — M5136 Other intervertebral disc degeneration, lumbar region: Secondary | ICD-10-CM

## 2018-03-08 MED ORDER — OXYCODONE-ACETAMINOPHEN 10-325 MG PO TABS: 1.0000 | ORAL_TABLET | ORAL | 0 refills | Status: DC | PRN

## 2018-03-09 ENCOUNTER — Other Ambulatory Visit: Payer: Self-pay | Admitting: Internal Medicine

## 2018-03-09 DIAGNOSIS — E039 Hypothyroidism, unspecified: Secondary | ICD-10-CM

## 2018-03-09 NOTE — Telephone Encounter (Signed)
Sent in on 03/08/2018.

## 2018-03-11 NOTE — Telephone Encounter (Signed)
LVM for pt to call back as soon as possible.   RE: pt to call back if he needs anything in addition

## 2018-03-15 ENCOUNTER — Encounter: Payer: Self-pay | Admitting: Internal Medicine

## 2018-03-15 ENCOUNTER — Other Ambulatory Visit (INDEPENDENT_AMBULATORY_CARE_PROVIDER_SITE_OTHER): Payer: BLUE CROSS/BLUE SHIELD

## 2018-03-15 ENCOUNTER — Ambulatory Visit: Payer: BLUE CROSS/BLUE SHIELD | Admitting: Internal Medicine

## 2018-03-15 VITALS — BP 130/80 | HR 76 | Temp 98.2°F | Resp 16 | Ht 68.0 in | Wt 210.0 lb

## 2018-03-15 DIAGNOSIS — Z79891 Long term (current) use of opiate analgesic: Secondary | ICD-10-CM | POA: Diagnosis not present

## 2018-03-15 DIAGNOSIS — E039 Hypothyroidism, unspecified: Secondary | ICD-10-CM

## 2018-03-15 DIAGNOSIS — F329 Major depressive disorder, single episode, unspecified: Secondary | ICD-10-CM

## 2018-03-15 DIAGNOSIS — F419 Anxiety disorder, unspecified: Secondary | ICD-10-CM

## 2018-03-15 DIAGNOSIS — F32A Depression, unspecified: Secondary | ICD-10-CM

## 2018-03-15 LAB — TSH: TSH: 3.87 u[IU]/mL (ref 0.35–4.50)

## 2018-03-15 MED ORDER — BUPROPION HCL ER (XL) 150 MG PO TB24: 150.0000 mg | ORAL_TABLET | Freq: Every day | ORAL | 1 refills | Status: DC

## 2018-03-15 MED ORDER — NALOXONE HCL 4 MG/0.1ML NA LIQD: 1.0000 | Freq: Once | NASAL | 2 refills | Status: AC

## 2018-03-15 NOTE — Patient Instructions (Signed)
Hypothyroidism Hypothyroidism is a disorder of the thyroid. The thyroid is a large gland that is located in the lower front of the neck. The thyroid releases hormones that control how the body works. With hypothyroidism, the thyroid does not make enough of these hormones. What are the causes? Causes of hypothyroidism may include:  Viral infections.  Pregnancy.  Your own defense system (immune system) attacking your thyroid.  Certain medicines.  Birth defects.  Past radiation treatments to your head or neck.  Past treatment with radioactive iodine.  Past surgical removal of part or all of your thyroid.  Problems with the gland that is located in the center of your brain (pituitary).  What are the signs or symptoms? Signs and symptoms of hypothyroidism may include:  Feeling as though you have no energy (lethargy).  Inability to tolerate cold.  Weight gain that is not explained by a change in diet or exercise habits.  Dry skin.  Coarse hair.  Menstrual irregularity.  Slowing of thought processes.  Constipation.  Sadness or depression.  How is this diagnosed? Your health care provider may diagnose hypothyroidism with blood tests and ultrasound tests. How is this treated? Hypothyroidism is treated with medicine that replaces the hormones that your body does not make. After you begin treatment, it may take several weeks for symptoms to go away. Follow these instructions at home:  Take medicines only as directed by your health care provider.  If you start taking any new medicines, tell your health care provider.  Keep all follow-up visits as directed by your health care provider. This is important. As your condition improves, your dosage needs may change. You will need to have blood tests regularly so that your health care provider can watch your condition. Contact a health care provider if:  Your symptoms do not get better with treatment.  You are taking thyroid  replacement medicine and: ? You sweat excessively. ? You have tremors. ? You feel anxious. ? You lose weight rapidly. ? You cannot tolerate heat. ? You have emotional swings. ? You have diarrhea. ? You feel weak. Get help right away if:  You develop chest pain.  You develop an irregular heartbeat.  You develop a rapid heartbeat. This information is not intended to replace advice given to you by your health care provider. Make sure you discuss any questions you have with your health care provider. Document Released: 09/29/2005 Document Revised: 03/06/2016 Document Reviewed: 02/14/2014 Elsevier Interactive Patient Education  2018 [employer]  

## 2018-03-15 NOTE — Progress Notes (Signed)
Subjective:  Patient ID: Jonathan Luna, male    DOB: 1966/03/05  Age: 52 y.o. MRN: [id number]  CC: Hypothyroidism; Back Pain; and Depression   HPI Jonathan Luna presents for f/up - he complains of anhedonia, fatigue, weight gain, and chronic low back pain.  He has gotten some relief with the anxiety, insomnia, and depression with Viibryd but he wants to know if he could add something else to help motivate him more.  He tells me the low back pain is unchanged.  He is getting adequate symptom relief with the occasional dose of Percocet.  Outpatient Medications Prior to Visit  Medication Sig Dispense Refill  . aspirin 81 MG tablet Take 81 mg by mouth daily.    . clonazePAM (KLONOPIN) 1 MG tablet TAKE 1 TABLET BY MOUTH TWICE DAILY AS NEEDED FOR ANXIETY 60 tablet 3  . mometasone (NASONEX) 50 MCG/ACT nasal spray Place 2 sprays into the nose daily. 17 g 12  . omega-3 acid ethyl esters (LOVAZA) 1 g capsule take 2 capsules by mouth 2 times daily  360 capsule 1  . oxyCODONE-acetaminophen (PERCOCET) 10-325 MG tablet Take 1 tablet by mouth every 4 (four) hours as needed for pain. 100 tablet 0  . Vilazodone HCl (VIIBRYD) 40 MG TABS Take 1 tablet (40 mg total) by mouth daily. 90 tablet 1  . levothyroxine (SYNTHROID, LEVOTHROID) 175 MCG tablet TAKE ONE TABLET BY MOUTH ONE TIME DAILY BEFORE BREAKFAST 90 tablet 0  . levocetirizine (XYZAL) 5 MG tablet Take 1 tablet (5 mg total) by mouth every evening. (Patient not taking: Reported on 03/15/2018) 90 tablet 1   No facility-administered medications prior to visit.     ROS Review of Systems  Constitutional: Positive for fatigue and unexpected weight change. Negative for activity change, appetite change and diaphoresis.  HENT: Negative.   Eyes: Negative.  Negative for visual disturbance.  Respiratory: Negative for cough, chest tightness, shortness of breath and wheezing.   Cardiovascular: Negative for chest pain, palpitations and leg swelling.  Gastrointestinal:  Negative for abdominal pain, constipation, diarrhea and nausea.  Endocrine: Negative for cold intolerance and heat intolerance.  Genitourinary: Negative.  Negative for difficulty urinating.  Musculoskeletal: Positive for back pain. Negative for arthralgias and myalgias.  Skin: Negative.  Negative for color change.  Neurological: Negative.  Negative for dizziness, numbness and headaches.  Hematological: Negative for adenopathy. Does not bruise/bleed easily.  Psychiatric/Behavioral: Positive for dysphoric mood. Negative for confusion, decreased concentration, self-injury, sleep disturbance and suicidal ideas. The patient is not nervous/anxious.     Objective:  BP 130/80 (BP Location: Left Arm, Patient Position: Sitting, Cuff Size: Large)   Pulse 76   Temp 98.2 F (36.8 C) (Oral)   Resp 16   Ht 5' 8"  (1.727 m)   Wt 210 lb (95.3 kg)   SpO2 96%   BMI 31.93 kg/m   BP Readings from Last 3 Encounters:  03/15/18 130/80  12/07/17 118/70  08/18/17 130/80    Wt Readings from Last 3 Encounters:  03/15/18 210 lb (95.3 kg)  12/07/17 209 lb (94.8 kg)  08/18/17 206 lb (93.4 kg)    Physical Exam  Constitutional: He is oriented to person, place, and time. No distress.  HENT:  Mouth/Throat: Oropharynx is clear and moist. No oropharyngeal exudate.  Eyes: Conjunctivae are normal. No scleral icterus.  Neck: Normal range of motion. Neck supple. No JVD present. No thyromegaly present.  Cardiovascular: Normal rate, regular rhythm and normal heart sounds.  No murmur heard.  Pulmonary/Chest: Effort normal and breath sounds normal. No respiratory distress. He has no wheezes. He exhibits no tenderness.  Abdominal: Soft. Bowel sounds are normal. He exhibits no mass. There is no hepatosplenomegaly. There is no tenderness.  Musculoskeletal: Normal range of motion. He exhibits no edema, tenderness or deformity.  Lymphadenopathy:    He has no cervical adenopathy.  Neurological: He is alert and oriented to  person, place, and time.  Skin: Skin is warm and dry. No rash noted. He is not diaphoretic.  Psychiatric: His behavior is normal. Judgment normal. His mood appears not anxious. His speech is not delayed and not tangential. He is not agitated, not slowed, not withdrawn and not actively hallucinating. Cognition and memory are normal. He exhibits a depressed mood. He expresses no homicidal ideation. He is attentive.  Vitals reviewed.   Lab Results  Component Value Date   WBC 8.5 12/07/2017   HGB 15.0 12/07/2017   HCT 43.3 12/07/2017   PLT 328.0 12/07/2017   GLUCOSE 89 12/07/2017   CHOL 186 12/07/2017   TRIG 270.0 (H) 12/07/2017   HDL 34.70 (L) 12/07/2017   LDLDIRECT 122.0 12/07/2017   LDLCALC 106 (H) 04/07/2017   ALT 30 12/07/2017   AST 21 12/07/2017   NA 139 12/07/2017   K 4.6 12/07/2017   CL 102 12/07/2017   CREATININE 0.99 12/07/2017   BUN 12 12/07/2017   CO2 28 12/07/2017   TSH 3.87 03/15/2018   PSA 2.36 12/07/2017   HGBA1C 5.2 07/28/2012    [name]  Result Date: 02/15/2016 CLINICAL DATA:  52 year old male with chronic symptoms getting worse. Bilateral lower back pain restricting motion. Burning and anterior left high. Initial encounter. EXAM: MRI LUMBAR SPINE WITHOUT CONTRAST TECHNIQUE: Multiplanar, multisequence MR imaging of the lumbar spine was performed. No intravenous contrast was administered. COMPARISON:  No comparison MR. 04/15/2007 CT abdomen and pelvis. FINDINGS: Last fully open disk space is labeled L5-S1. Present examination incorporates from T11 through upper S3 level. Conus upper L1 level. Remote Schmorl's node deformity L4 superior endplate. Questioned left T11 1 cm nonspecific osseous lesion. It is possible this is related to the rib articulation as versus primary osseous lesion. No other worrisome bony lesions or history of cancer to suggest this is a significant finding. Minimal bulge T10-11 level suspected. T11-12 and T12-L1 unremarkable. L1-2:   Minimal retrolisthesis L1.  Minimal bulge. L2-3: Disc degeneration with mild adjacent endplate reactive changes. Bulge. No significant spinal stenosis or foraminal narrowing. L3-4: Minimal to mild bulge. No significant spinal stenosis or foraminal narrowing. L4-5: Moderate facet degenerative changes. Bulge. Very mild foraminal narrowing without nerve root compression. No significant spinal stenosis. L5-S1:  Mild facet degenerative changes. IMPRESSION: Degenerative changes throughout the lumbar spine most notable L4-5 level however, no significant spinal stenosis, foraminal narrowing or nerve root compression detected. Please see above for further detail. Electronically Signed   By: Genia Del M.D.   On: 02/15/2016 21:35    Assessment & Plan:   Gjon was seen today for hypothyroidism, back pain and depression.  Diagnoses and all orders for this visit:  Acquired hypothyroidism- His TSH is up to 3.87.  He is symptomatic so I have recommended an increase in his levothyroxine dose. -     TSH; Future -     levothyroxine (SYNTHROID) 200 MCG tablet; Take 1 tablet (200 mcg total) by mouth daily before breakfast.  Encounter for long-term opiate analgesic use -     naloxone (NARCAN) nasal spray 4  mg/0.1 mL; Place 1 spray into the nose once for 1 dose.  Anxiety and depression- Will add bupropion to the Viibryd to get better resolution of his depressive symptoms. -     buPROPion (WELLBUTRIN XL) 150 MG 24 hr tablet; Take 1 tablet (150 mg total) by mouth daily.   I have discontinued Chaz L. Serpa's levocetirizine and levothyroxine. I am also having him start on naloxone, buPROPion, and levothyroxine. Additionally, I am having him maintain his aspirin, mometasone, clonazePAM, Vilazodone HCl, omega-3 acid ethyl esters, and oxyCODONE-acetaminophen.  Meds ordered this encounter  Medications  . naloxone (NARCAN) nasal spray 4 mg/0.1 mL    Sig: Place 1 spray into the nose once for 1 dose.    Dispense:  2 kit     Refill:  2  . buPROPion (WELLBUTRIN XL) 150 MG 24 hr tablet    Sig: Take 1 tablet (150 mg total) by mouth daily.    Dispense:  90 tablet    Refill:  1  . levothyroxine (SYNTHROID) 200 MCG tablet    Sig: Take 1 tablet (200 mcg total) by mouth daily before breakfast.    Dispense:  90 tablet    Refill:  0     Follow-up: Return in about 3 months (around 06/15/2018).  Scarlette Calico, MD

## 2018-03-16 ENCOUNTER — Encounter: Payer: Self-pay | Admitting: Internal Medicine

## 2018-03-16 MED ORDER — LEVOTHYROXINE SODIUM 200 MCG PO TABS
200.0000 ug | ORAL_TABLET | Freq: Every day | ORAL | 0 refills | Status: DC
Start: 2018-03-16 — End: 2018-06-15

## 2018-04-01 ENCOUNTER — Encounter: Payer: Self-pay | Admitting: Internal Medicine

## 2018-04-05 ENCOUNTER — Other Ambulatory Visit: Payer: Self-pay | Admitting: Internal Medicine

## 2018-04-06 ENCOUNTER — Other Ambulatory Visit: Payer: Self-pay | Admitting: Internal Medicine

## 2018-04-07 ENCOUNTER — Encounter: Payer: Self-pay | Admitting: Physician Assistant

## 2018-04-07 ENCOUNTER — Other Ambulatory Visit: Payer: Self-pay | Admitting: Internal Medicine

## 2018-04-07 ENCOUNTER — Ambulatory Visit: Payer: BLUE CROSS/BLUE SHIELD | Admitting: Physician Assistant

## 2018-04-07 VITALS — BP 136/90 | HR 84 | Temp 97.8°F | Resp 16 | Ht 68.0 in | Wt 205.6 lb

## 2018-04-07 DIAGNOSIS — J069 Acute upper respiratory infection, unspecified: Secondary | ICD-10-CM | POA: Diagnosis not present

## 2018-04-07 DIAGNOSIS — M5136 Other intervertebral disc degeneration, lumbar region: Secondary | ICD-10-CM

## 2018-04-07 DIAGNOSIS — Z79891 Long term (current) use of opiate analgesic: Secondary | ICD-10-CM | POA: Insufficient documentation

## 2018-04-07 MED ORDER — OXYCODONE-ACETAMINOPHEN 10-325 MG PO TABS: 1.0000 | ORAL_TABLET | ORAL | 0 refills | Status: DC | PRN

## 2018-04-07 MED ORDER — AZITHROMYCIN 250 MG PO TABS
ORAL_TABLET | ORAL | 0 refills | Status: DC
Start: 2018-04-07 — End: 2018-06-07

## 2018-04-07 MED ORDER — NALOXONE HCL 4 MG/0.1ML NA LIQD: 1.0000 | Freq: Once | NASAL | 2 refills | Status: AC

## 2018-04-07 MED ORDER — HYDROCODONE-HOMATROPINE 5-1.5 MG/5ML PO SYRP
2.5000 mL | ORAL_SOLUTION | Freq: Every evening | ORAL | 0 refills | Status: DC | PRN
Start: 2018-04-07 — End: 2018-05-06

## 2018-04-07 NOTE — Patient Instructions (Addendum)
  Take ibuprofen 600 mg every eight hour.  Take zyrtec-D one every 12 hours.    IF you received an x-ray today, you will receive an invoice from [facility]. Please contact [facility] at [phone] with questions or concerns regarding your invoice.   IF you received labwork today, you will receive an invoice from [facility]. Please contact [facility] at [phone] with questions or concerns regarding your invoice.   Our billing staff will not be able to assist you with questions regarding bills from these companies.  You will be contacted with the lab results as soon as they are available. The fastest way to get your results is to activate your My Chart account. Instructions are located on the last page of this paperwork. If you have not heard from [name] regarding the results in 2 weeks, please contact this office.

## 2018-04-07 NOTE — Progress Notes (Signed)
04/09/2018 1:45 PM   DOB: [birth year] / MRN: [id number]  SUBJECTIVE:  Jonathan Luna is a 52 y.o. male presenting for sinus pressure and drainage. Symptoms present for about 4 days.  Associates cough.  Denies fever, shortness of breath, chest pain.  The problem is worsening. He has tried nothing.  He normally sees Dr. Ronnald Luna for these problems and tells me that he typically gets an  antibiotic.  He is not willing to wait on taking that today.    He is allergic to fluoxetine hcl and venlafaxine.   He  has a past medical history of Allergy, Anxiety, Arthritis, Cataract, Depression, Heart murmur, History of bilateral cataract extraction, Hyperlipidemia, Hypothyroidism, Low back pain, Panic attack, and Panic attacks.    He  reports that he has never smoked. He has never used smokeless tobacco. He reports that he drinks about 1.2 oz of alcohol per week. He reports that he does not use drugs. He  reports that he currently engages in sexual activity. He reports using the following method of birth control/protection: Condom. The patient  has a past surgical history that includes Cataract extraction and Eye surgery.  His family history includes Breast cancer in his mother; Cancer in his mother; Diabetes in his brother; Heart disease in his father; Hyperlipidemia in his brother; Hypertension in his father.  Review of Systems  Respiratory: Negative for cough and shortness of breath.   Cardiovascular: Negative for chest pain.  Skin: Negative for itching and rash.  Neurological: Negative for dizziness.    The problem list and medications were reviewed and updated by myself where necessary and exist elsewhere in the encounter.   OBJECTIVE:  BP 136/90 (BP Location: Left Arm)   Pulse 84   Temp 97.8 F (36.6 C) (Oral)   Resp 16   Ht 5\' 8"  (1.727 m)   Wt 205 lb 9.6 oz (93.3 kg)   SpO2 95%   BMI 31.26 kg/m   Wt Readings from Last 3 Encounters:  04/07/18 205 lb 9.6 oz (93.3 kg)  03/15/18 210 lb (95.3  kg)  12/07/17 209 lb (94.8 kg)   Temp Readings from Last 3 Encounters:  04/07/18 97.8 F (36.6 C) (Oral)  03/15/18 98.2 F (36.8 C) (Oral)  12/07/17 97.8 F (36.6 C) (Oral)   BP Readings from Last 3 Encounters:  04/07/18 136/90  03/15/18 130/80  12/07/17 118/70   Pulse Readings from Last 3 Encounters:  04/07/18 84  03/15/18 76  12/07/17 91    Physical Exam  Constitutional: He is oriented to person, place, and time. He appears well-developed. He is active.  Non-toxic appearance. He does not appear ill.  Eyes: Pupils are equal, round, and reactive to light. Conjunctivae and EOM are normal.  Cardiovascular: Normal rate, regular rhythm, S1 normal, S2 normal, normal heart sounds, intact distal pulses and normal pulses. Exam reveals no gallop and no friction rub.  No murmur heard. Pulmonary/Chest: Effort normal. No stridor. No respiratory distress. He has no wheezes. He has no rales.  Abdominal: He exhibits no distension.  Musculoskeletal: Normal range of motion. He exhibits no edema.  Neurological: He is alert and oriented to person, place, and time. No cranial nerve deficit. Coordination normal.  Skin: Skin is warm and dry. He is not diaphoretic. No pallor.  Psychiatric: He has a normal mood and affect.  Nursing note and vitals reviewed.   Lab Results  Component Value Date   HGBA1C 5.2 07/28/2012    Lab Results  Component Value Date   WBC 8.5 12/07/2017   HGB 15.0 12/07/2017   HCT 43.3 12/07/2017   MCV 89.9 12/07/2017   PLT 328.0 12/07/2017    Lab Results  Component Value Date   CREATININE 0.99 12/07/2017   BUN 12 12/07/2017   NA 139 12/07/2017   K 4.6 12/07/2017   CL 102 12/07/2017   CO2 28 12/07/2017    Lab Results  Component Value Date   ALT 30 12/07/2017   AST 21 12/07/2017   ALKPHOS 52 12/07/2017   BILITOT 0.5 12/07/2017    Lab Results  Component Value Date   TSH 3.87 03/15/2018    Lab Results  Component Value Date   CHOL 186 12/07/2017    HDL 34.70 (L) 12/07/2017   LDLCALC 106 (H) 04/07/2017   LDLDIRECT 122.0 12/07/2017   TRIG 270.0 (H) 12/07/2017   CHOLHDL 5 12/07/2017     ASSESSMENT AND PLAN:  Jonathan Luna was seen today for sinus problem, cough and otalgia.  Diagnoses and all orders for this visit:  Acute URI: Patient prefers to be placed on antibiotics rather than waiting as he has had similar symptoms in the past and typically receives antibiotics.  He understands the risk associated with antibiotic overuse and has never had any side effects with azithromycin.  I tried to caution him to wait given his symptoms most likely represent a cold.  Patient tells me prefers treatment with antibiotics.  Despite this I am urging him to give his body and his immune system more time before starting antibiotics.  I have given him a paper prescription hopefully he will wait before filling this.  Advised that he can take Zyrtec daily along with an NSAID and the cough syrup. -     azithromycin (ZITHROMAX) 250 MG tablet; Take two tabs on day one and one daily thereafter. -     HYDROcodone-homatropine (HYCODAN) 5-1.5 MG/5ML syrup; Take 2.5-5 mLs by mouth at bedtime as needed.    The patient is advised to call or return to clinic if he does not see an improvement in symptoms, or to seek the care of the closest emergency department if he worsens with the above plan.   Jonathan Luna, MHS, PA-C [facility] 04/09/2018 1:45 PM

## 2018-04-12 ENCOUNTER — Other Ambulatory Visit: Payer: Self-pay | Admitting: Internal Medicine

## 2018-04-12 DIAGNOSIS — F411 Generalized anxiety disorder: Secondary | ICD-10-CM

## 2018-05-03 ENCOUNTER — Encounter: Payer: Self-pay | Admitting: Internal Medicine

## 2018-05-06 ENCOUNTER — Other Ambulatory Visit: Payer: Self-pay | Admitting: Internal Medicine

## 2018-05-06 DIAGNOSIS — M5136 Other intervertebral disc degeneration, lumbar region: Secondary | ICD-10-CM

## 2018-05-06 MED ORDER — OXYCODONE-ACETAMINOPHEN 10-325 MG PO TABS: 1.0000 | ORAL_TABLET | ORAL | 0 refills | Status: DC | PRN

## 2018-05-17 ENCOUNTER — Other Ambulatory Visit: Payer: Self-pay | Admitting: Internal Medicine

## 2018-05-17 DIAGNOSIS — E781 Pure hyperglyceridemia: Secondary | ICD-10-CM

## 2018-06-07 ENCOUNTER — Encounter: Payer: Self-pay | Admitting: Internal Medicine

## 2018-06-07 ENCOUNTER — Ambulatory Visit: Payer: BLUE CROSS/BLUE SHIELD | Admitting: Internal Medicine

## 2018-06-07 VITALS — BP 140/90 | HR 81 | Temp 98.1°F | Ht 68.0 in | Wt 210.0 lb

## 2018-06-07 DIAGNOSIS — G8929 Other chronic pain: Secondary | ICD-10-CM

## 2018-06-07 DIAGNOSIS — M545 Low back pain, unspecified: Secondary | ICD-10-CM

## 2018-06-07 DIAGNOSIS — F32A Depression, unspecified: Secondary | ICD-10-CM

## 2018-06-07 DIAGNOSIS — F329 Major depressive disorder, single episode, unspecified: Secondary | ICD-10-CM

## 2018-06-07 DIAGNOSIS — F419 Anxiety disorder, unspecified: Secondary | ICD-10-CM

## 2018-06-07 DIAGNOSIS — M5136 Other intervertebral disc degeneration, lumbar region: Secondary | ICD-10-CM

## 2018-06-07 MED ORDER — FLUOXETINE HCL 20 MG PO TABS: 20.0000 mg | ORAL_TABLET | Freq: Every day | ORAL | 1 refills | Status: DC

## 2018-06-07 MED ORDER — OXYCODONE-ACETAMINOPHEN 10-325 MG PO TABS: 1.0000 | ORAL_TABLET | ORAL | 0 refills | Status: DC | PRN

## 2018-06-07 NOTE — Progress Notes (Signed)
Subjective:  Patient ID: Jonathan Luna, male    DOB: 09-Nov-1965  Age: 52 y.o. MRN: [id number]  CC: Hypothyroidism; Back Pain; and Depression   HPI Jonathan Luna presents for f/up - He has stopped taking Wellbutrin and Viibryd because he felt like they hyped him up.  He wants to give Prozac a try.  He continues to struggle with anxiety and mild anhedonia.  He also struggles with chronic, nonradiating low back pain.  Denies any recent episodes of paresthesias.  He gets adequate control of the back pain with Percocet.  Outpatient Medications Prior to Visit  Medication Sig Dispense Refill  . aspirin 81 MG tablet Take 81 mg by mouth daily.    . clonazePAM (KLONOPIN) 1 MG tablet TAKE ONE TABLET BY MOUTH TWICE A DAY AS NEEDED FOR ANXIETY  60 tablet 2  . levothyroxine (SYNTHROID) 200 MCG tablet Take 1 tablet (200 mcg total) by mouth daily before breakfast. 90 tablet 0  . mometasone (NASONEX) 50 MCG/ACT nasal spray Place 2 sprays into the nose daily. 17 g 12  . omega-3 acid ethyl esters (LOVAZA) 1 g capsule TAKE TWO CAPSULES BY MOUTH TWICE DAILY  360 capsule 0  . oxyCODONE-acetaminophen (PERCOCET) 10-325 MG tablet Take 1 tablet by mouth every 4 (four) hours as needed for pain. 100 tablet 0  . azithromycin (ZITHROMAX) 250 MG tablet Take two tabs on day one and one daily thereafter. 6 tablet 0  . buPROPion (WELLBUTRIN XL) 150 MG 24 hr tablet Take 1 tablet (150 mg total) by mouth daily. (Patient not taking: Reported on 06/07/2018) 90 tablet 1  . Vilazodone HCl (VIIBRYD) 40 MG TABS Take 1 tablet (40 mg total) by mouth daily. 90 tablet 1   No facility-administered medications prior to visit.     ROS Review of Systems  Constitutional: Negative for appetite change, diaphoresis and fatigue.  HENT: Negative.   Eyes: Negative for visual disturbance.  Respiratory: Negative for cough, chest tightness, shortness of breath and wheezing.   Cardiovascular: Negative for chest pain, palpitations and leg swelling.    Gastrointestinal: Negative for abdominal pain, constipation, diarrhea, nausea and vomiting.  Endocrine: Negative.   Genitourinary: Negative.  Negative for difficulty urinating and dysuria.  Musculoskeletal: Positive for back pain. Negative for arthralgias, myalgias and neck pain.  Skin: Negative for color change and pallor.  Neurological: Negative.  Negative for dizziness, weakness, light-headedness and headaches.  Hematological: Negative for adenopathy. Does not bruise/bleed easily.  Psychiatric/Behavioral: Positive for dysphoric mood. Negative for behavioral problems, confusion, hallucinations, self-injury, sleep disturbance and suicidal ideas. The patient is nervous/anxious. The patient is not hyperactive.     Objective:  BP 140/90 (BP Location: Left Arm, Patient Position: Sitting, Cuff Size: Large)   Pulse 81   Temp 98.1 F (36.7 C) (Oral)   Ht 5\' 8"  (1.727 m)   Wt 210 lb (95.3 kg)   SpO2 93%   BMI 31.93 kg/m   BP Readings from Last 3 Encounters:  06/07/18 140/90  04/07/18 136/90  03/15/18 130/80    Wt Readings from Last 3 Encounters:  06/07/18 210 lb (95.3 kg)  04/07/18 205 lb 9.6 oz (93.3 kg)  03/15/18 210 lb (95.3 kg)    Physical Exam  Constitutional: He is oriented to person, place, and time. No distress.  HENT:  Mouth/Throat: Oropharynx is clear and moist. No oropharyngeal exudate.  Eyes: Conjunctivae are normal. No scleral icterus.  Neck: Normal range of motion. Neck supple. No JVD present. No thyromegaly present.  Cardiovascular: Normal rate, regular rhythm and normal heart sounds. Exam reveals no gallop and no friction rub.  No murmur heard. Pulmonary/Chest: Effort normal and breath sounds normal. He has no wheezes.  Abdominal: Soft. Normal appearance. There is no hepatosplenomegaly. There is no tenderness.  Musculoskeletal: Normal range of motion. He exhibits no edema, tenderness or deformity.       Lumbar back: Normal. He exhibits normal range of motion, no  tenderness, no bony tenderness, no swelling, no edema, no deformity and no pain.  Neurological: He is alert and oriented to person, place, and time. He has normal strength. He displays no atrophy and no tremor. He exhibits normal muscle tone. He displays a negative Romberg sign. He displays no seizure activity. Coordination and gait normal.  Reflex Scores:      Tricep reflexes are 1+ on the right side and 1+ on the left side.      Bicep reflexes are 1+ on the right side and 1+ on the left side.      Brachioradialis reflexes are 1+ on the right side and 1+ on the left side.      Patellar reflexes are 2+ on the right side and 2+ on the left side.      Achilles reflexes are 1+ on the right side and 1+ on the left side. Neg SLR in BLE  Skin: He is not diaphoretic.  Vitals reviewed.   Lab Results  Component Value Date   WBC 8.5 12/07/2017   HGB 15.0 12/07/2017   HCT 43.3 12/07/2017   PLT 328.0 12/07/2017   GLUCOSE 89 12/07/2017   CHOL 186 12/07/2017   TRIG 270.0 (H) 12/07/2017   HDL 34.70 (L) 12/07/2017   LDLDIRECT 122.0 12/07/2017   LDLCALC 106 (H) 04/07/2017   ALT 30 12/07/2017   AST 21 12/07/2017   NA 139 12/07/2017   K 4.6 12/07/2017   CL 102 12/07/2017   CREATININE 0.99 12/07/2017   BUN 12 12/07/2017   CO2 28 12/07/2017   TSH 3.87 03/15/2018   PSA 2.36 12/07/2017   HGBA1C 5.2 07/28/2012    [name]  Result Date: 02/15/2016 CLINICAL DATA:  52 year old male with chronic symptoms getting worse. Bilateral lower back pain restricting motion. Burning and anterior left high. Initial encounter. EXAM: MRI LUMBAR SPINE WITHOUT CONTRAST TECHNIQUE: Multiplanar, multisequence MR imaging of the lumbar spine was performed. No intravenous contrast was administered. COMPARISON:  No comparison MR. 04/15/2007 CT abdomen and pelvis. FINDINGS: Last fully open disk space is labeled L5-S1. Present examination incorporates from T11 through upper S3 level. Conus upper L1 level.  Remote Schmorl's node deformity L4 superior endplate. Questioned left T11 1 cm nonspecific osseous lesion. It is possible this is related to the rib articulation as versus primary osseous lesion. No other worrisome bony lesions or history of cancer to suggest this is a significant finding. Minimal bulge T10-11 level suspected. T11-12 and T12-L1 unremarkable. L1-2:  Minimal retrolisthesis L1.  Minimal bulge. L2-3: Disc degeneration with mild adjacent endplate reactive changes. Bulge. No significant spinal stenosis or foraminal narrowing. L3-4: Minimal to mild bulge. No significant spinal stenosis or foraminal narrowing. L4-5: Moderate facet degenerative changes. Bulge. Very mild foraminal narrowing without nerve root compression. No significant spinal stenosis. L5-S1:  Mild facet degenerative changes. IMPRESSION: Degenerative changes throughout the lumbar spine most notable L4-5 level however, no significant spinal stenosis, foraminal narrowing or nerve root compression detected. Please see above for further detail. Electronically Signed   By: Remo Lipps  Jeannine Kitten M.D.   On: 02/15/2016 21:35    Assessment & Plan:   Sarvesh was seen today for hypothyroidism, back pain and depression.  Diagnoses and all orders for this visit:  Anxiety and depression -     FLUoxetine (PROZAC) 20 MG tablet; Take 1 tablet (20 mg total) by mouth daily.  DDD (degenerative disc disease), lumbar- His description of the back pain has not changed.  He is neurologically intact.  Will continue Percocet as needed. -     oxyCODONE-acetaminophen (PERCOCET) 10-325 MG tablet; Take 1 tablet by mouth every 4 (four) hours as needed for pain.  Chronic low back pain without sciatica, unspecified back pain laterality -     oxyCODONE-acetaminophen (PERCOCET) 10-325 MG tablet; Take 1 tablet by mouth every 4 (four) hours as needed for pain.   I have discontinued Lena L. Gaby's Vilazodone HCl, buPROPion, and azithromycin. I am also having him start on  FLUoxetine. Additionally, I am having him maintain his aspirin, mometasone, levothyroxine, clonazePAM, omega-3 acid ethyl esters, and oxyCODONE-acetaminophen.  Meds ordered this encounter  Medications  . oxyCODONE-acetaminophen (PERCOCET) 10-325 MG tablet    Sig: Take 1 tablet by mouth every 4 (four) hours as needed for pain.    Dispense:  100 tablet    Refill:  0  . FLUoxetine (PROZAC) 20 MG tablet    Sig: Take 1 tablet (20 mg total) by mouth daily.    Dispense:  90 tablet    Refill:  1     Follow-up: Return in about 3 months (around 09/07/2018).  Scarlette Calico, MD

## 2018-06-07 NOTE — Patient Instructions (Signed)
Major Depressive Disorder, Adult Major depressive disorder (MDD) is a mental health condition. It may also be called clinical depression or unipolar depression. MDD usually causes feelings of sadness, hopelessness, or helplessness. MDD can also cause physical symptoms. It can interfere with work, school, relationships, and other everyday activities. MDD may be mild, moderate, or severe. It may occur once (single episode major depressive disorder) or it may occur multiple times (recurrent major depressive disorder). What are the causes? The exact cause of this condition is not known. MDD is most likely caused by a combination of things, which may include:  Genetic factors. These are traits that are passed along from parent to child.  Individual factors. Your personality, your behavior, and the way you handle your thoughts and feelings may contribute to MDD. This includes personality traits and behaviors learned from others.  Physical factors, such as: ? Differences in the part of your brain that controls emotion. This part of your brain may be different than it is in people who do not have MDD. ? Long-term (chronic) medical or psychiatric illnesses.  Social factors. Traumatic experiences or major life changes may play a role in the development of MDD.  What increases the risk? This condition is more likely to develop in women. The following factors may also make you more likely to develop MDD:  A family history of depression.  Troubled family relationships.  Abnormally low levels of certain brain chemicals.  Traumatic events in childhood, especially abuse or the loss of a parent.  Being under a lot of stress, or long-term stress, especially from upsetting life experiences or losses.  A history of: ? Chronic physical illness. ? Other mental health disorders. ? Substance abuse.  Poor living conditions.  Experiencing social exclusion or discrimination on a regular basis.  What are  the signs or symptoms? The main symptoms of MDD typically include:  Constant depressed or irritable mood.  Loss of interest in things and activities.  MDD symptoms may also include:  Sleeping or eating too much or too little.  Unexplained weight change.  Fatigue or low energy.  Feelings of worthlessness or guilt.  Difficulty thinking clearly or making decisions.  Thoughts of suicide or of harming others.  Physical agitation or weakness.  Isolation.  Severe cases of MDD may also occur with other symptoms, such as:  Delusions or hallucinations, in which you imagine things that are not real (psychotic depression).  Low-level depression that lasts at least a year (chronic depression or persistent depressive disorder).  Extreme sadness and hopelessness (melancholic depression).  Trouble speaking and moving (catatonic depression).  How is this diagnosed? This condition may be diagnosed based on:  Your symptoms.  Your medical history, including your mental health history. This may involve tests to evaluate your mental health. You may be asked questions about your lifestyle, including any drug and alcohol use, and how long you have had symptoms of MDD.  A physical exam.  Blood tests to rule out other conditions.  You must have a depressed mood and at least four other MDD symptoms most of the day, nearly every day in the same 2-week timeframe before your health care provider can confirm a diagnosis of MDD. How is this treated? This condition is usually treated by mental health professionals, such as psychologists, psychiatrists, and clinical social workers. You may need more than one type of treatment. Treatment may include:  Psychotherapy. This is also called talk therapy or counseling. Types of psychotherapy include: ? Cognitive behavioral   therapy (CBT). This type of therapy teaches you to recognize unhealthy feelings, thoughts, and behaviors, and replace them with  positive thoughts and actions. ? Interpersonal therapy (IPT). This helps you to improve the way you relate to and communicate with others. ? Family therapy. This treatment includes members of your family.  Medicine to treat anxiety and depression, or to help you control certain emotions and behaviors.  Lifestyle changes, such as: ? Limiting alcohol and drug use. ? Exercising regularly. ? Getting plenty of sleep. ? Making healthy eating choices. ? Spending more time outdoors.  Treatments involving stimulation of the brain can be used in situations with extremely severe symptoms, or when medicine or other therapies do not work over time. These treatments include electroconvulsive therapy, transcranial magnetic stimulation, and vagal nerve stimulation. Follow these instructions at home: Activity  Return to your normal activities as told by your health care provider.  Exercise regularly and spend time outdoors as told by your health care provider. General instructions  Take over-the-counter and prescription medicines only as told by your health care provider.  Do not drink alcohol. If you drink alcohol, limit your alcohol intake to no more than 1 drink a day for nonpregnant women and 2 drinks a day for men. One drink equals 12 oz of beer, 5 oz of wine, or 1 oz of hard liquor. Alcohol can affect any antidepressant medicines you are taking. Talk to your health care provider about your alcohol use.  Eat a healthy diet and get plenty of sleep.  Find activities that you enjoy doing, and make time to do them.  Consider joining a support group. Your health care provider may be able to recommend a support group.  Keep all follow-up visits as told by your health care provider. This is important. Where to find more information: National Alliance on Mental Illness  www.nami.org  U.[place]. National Institute of Mental Health  www.nimh.nih.gov  National Suicide Prevention  Lifeline  1-800-273-TALK (8255). This is free, 24-hour help.  Contact a health care provider if:  Your symptoms get worse.  You develop new symptoms. Get help right away if:  You self-harm.  You have serious thoughts about hurting yourself or others.  You see, hear, taste, smell, or feel things that are not present (hallucinate). This information is not intended to replace advice given to you by your health care provider. Make sure you discuss any questions you have with your health care provider. Document Released: 01/24/2013 Document Revised: 06/05/2016 Document Reviewed: 04/09/2016 Elsevier Interactive Patient Education  2018 Elsevier Inc.  

## 2018-06-15 ENCOUNTER — Other Ambulatory Visit: Payer: Self-pay | Admitting: Internal Medicine

## 2018-06-15 DIAGNOSIS — E039 Hypothyroidism, unspecified: Secondary | ICD-10-CM

## 2018-07-01 ENCOUNTER — Encounter: Payer: Self-pay | Admitting: Internal Medicine

## 2018-07-06 ENCOUNTER — Other Ambulatory Visit: Payer: Self-pay | Admitting: Internal Medicine

## 2018-07-06 DIAGNOSIS — G8929 Other chronic pain: Secondary | ICD-10-CM

## 2018-07-06 DIAGNOSIS — M545 Low back pain, unspecified: Secondary | ICD-10-CM

## 2018-07-06 DIAGNOSIS — M5136 Other intervertebral disc degeneration, lumbar region: Secondary | ICD-10-CM

## 2018-07-06 MED ORDER — OXYCODONE-ACETAMINOPHEN 10-325 MG PO TABS: 1.0000 | ORAL_TABLET | ORAL | 0 refills | Status: DC | PRN

## 2018-07-14 ENCOUNTER — Other Ambulatory Visit: Payer: Self-pay | Admitting: Internal Medicine

## 2018-07-14 DIAGNOSIS — F411 Generalized anxiety disorder: Secondary | ICD-10-CM

## 2018-07-15 ENCOUNTER — Encounter: Payer: Self-pay | Admitting: Internal Medicine

## 2018-07-16 NOTE — Telephone Encounter (Signed)
Controlled Substance Datatbase checked and last fill for clonazepam rx rq was 06/15/2018 #60 LOV was 06/07/2018 NOV is not scheduled but pt is compliant with visits.   Please advise in PCP absence?

## 2018-07-27 ENCOUNTER — Encounter: Payer: Self-pay | Admitting: Internal Medicine

## 2018-07-27 DIAGNOSIS — Z23 Encounter for immunization: Secondary | ICD-10-CM | POA: Diagnosis not present

## 2018-08-02 ENCOUNTER — Encounter: Payer: Self-pay | Admitting: Internal Medicine

## 2018-08-02 NOTE — Telephone Encounter (Signed)
Key: [id number]

## 2018-08-04 ENCOUNTER — Other Ambulatory Visit: Payer: Self-pay | Admitting: Internal Medicine

## 2018-08-04 DIAGNOSIS — G8929 Other chronic pain: Secondary | ICD-10-CM

## 2018-08-04 DIAGNOSIS — M5136 Other intervertebral disc degeneration, lumbar region: Secondary | ICD-10-CM

## 2018-08-04 DIAGNOSIS — M545 Low back pain, unspecified: Secondary | ICD-10-CM

## 2018-08-04 MED ORDER — OXYCODONE-ACETAMINOPHEN 10-325 MG PO TABS: 1.0000 | ORAL_TABLET | ORAL | 0 refills | Status: DC | PRN

## 2018-08-07 ENCOUNTER — Other Ambulatory Visit: Payer: Self-pay | Admitting: Internal Medicine

## 2018-08-07 DIAGNOSIS — J301 Allergic rhinitis due to pollen: Secondary | ICD-10-CM

## 2018-09-07 ENCOUNTER — Encounter: Payer: Self-pay | Admitting: Internal Medicine

## 2018-09-07 ENCOUNTER — Other Ambulatory Visit (INDEPENDENT_AMBULATORY_CARE_PROVIDER_SITE_OTHER): Payer: BLUE CROSS/BLUE SHIELD

## 2018-09-07 ENCOUNTER — Telehealth: Payer: Self-pay | Admitting: Internal Medicine

## 2018-09-07 ENCOUNTER — Ambulatory Visit: Payer: BLUE CROSS/BLUE SHIELD | Admitting: Internal Medicine

## 2018-09-07 VITALS — BP 142/94 | HR 80 | Temp 97.7°F | Resp 16 | Ht 68.0 in | Wt 216.0 lb

## 2018-09-07 DIAGNOSIS — I10 Essential (primary) hypertension: Secondary | ICD-10-CM

## 2018-09-07 DIAGNOSIS — M51369 Other intervertebral disc degeneration, lumbar region without mention of lumbar back pain or lower extremity pain: Secondary | ICD-10-CM

## 2018-09-07 DIAGNOSIS — F32A Depression, unspecified: Secondary | ICD-10-CM

## 2018-09-07 DIAGNOSIS — E781 Pure hyperglyceridemia: Secondary | ICD-10-CM | POA: Diagnosis not present

## 2018-09-07 DIAGNOSIS — M5136 Other intervertebral disc degeneration, lumbar region: Secondary | ICD-10-CM

## 2018-09-07 DIAGNOSIS — E039 Hypothyroidism, unspecified: Secondary | ICD-10-CM | POA: Diagnosis not present

## 2018-09-07 DIAGNOSIS — Z79891 Long term (current) use of opiate analgesic: Secondary | ICD-10-CM

## 2018-09-07 DIAGNOSIS — E785 Hyperlipidemia, unspecified: Secondary | ICD-10-CM | POA: Diagnosis not present

## 2018-09-07 DIAGNOSIS — F419 Anxiety disorder, unspecified: Secondary | ICD-10-CM

## 2018-09-07 DIAGNOSIS — G8929 Other chronic pain: Secondary | ICD-10-CM

## 2018-09-07 DIAGNOSIS — M545 Low back pain, unspecified: Secondary | ICD-10-CM

## 2018-09-07 DIAGNOSIS — F329 Major depressive disorder, single episode, unspecified: Secondary | ICD-10-CM

## 2018-09-07 DIAGNOSIS — E559 Vitamin D deficiency, unspecified: Secondary | ICD-10-CM

## 2018-09-07 LAB — LIPID PANEL
CHOL/HDL RATIO: 5
Cholesterol: 216 mg/dL — ABNORMAL HIGH (ref 0–200)
HDL: 42.2 mg/dL (ref >39.00)
NONHDL: 173.95
TRIGLYCERIDES: 249 mg/dL — AB (ref 0.0–149.0)
VLDL: 49.8 mg/dL — ABNORMAL HIGH (ref 0.0–40.0)

## 2018-09-07 LAB — TSH: TSH: 0.05 u[IU]/mL — AB (ref 0.35–4.50)

## 2018-09-07 LAB — URINALYSIS, ROUTINE W REFLEX MICROSCOPIC
BILIRUBIN URINE: NEGATIVE
Hgb urine dipstick: NEGATIVE
Ketones, ur: NEGATIVE
Leukocytes, UA: NEGATIVE
Nitrite: NEGATIVE
PH: 6 (ref 5.0–8.0)
RBC / HPF: NONE SEEN (ref 0–?)
Total Protein, Urine: NEGATIVE
Urine Glucose: NEGATIVE
Urobilinogen, UA: 0.2 (ref 0.0–1.0)

## 2018-09-07 LAB — BASIC METABOLIC PANEL
BUN: 12 mg/dL (ref 6–23)
CALCIUM: 9.6 mg/dL (ref 8.4–10.5)
CHLORIDE: 99 meq/L (ref 96–112)
CO2: 27 meq/L (ref 19–32)
Creatinine, Ser: 0.92 mg/dL (ref 0.40–1.50)
GFR: 91.69 mL/min (ref >60.00)
GLUCOSE: 98 mg/dL (ref 70–99)
POTASSIUM: 4.2 meq/L (ref 3.5–5.1)
SODIUM: 136 meq/L (ref 135–145)

## 2018-09-07 LAB — LDL CHOLESTEROL, DIRECT: Direct LDL: 151 mg/dL

## 2018-09-07 MED ORDER — AZILSARTAN MEDOXOMIL 80 MG PO TABS: 1.0000 | ORAL_TABLET | Freq: Every day | ORAL | 0 refills | Status: DC

## 2018-09-07 MED ORDER — DULOXETINE HCL 30 MG PO CPEP: 30.0000 mg | ORAL_CAPSULE | Freq: Every day | ORAL | 0 refills | Status: DC

## 2018-09-07 NOTE — Telephone Encounter (Signed)
Copied from [place] [id number]. Topic: Quick Communication - Rx Refill/Question >> Sep 07, 2018  5:58 PM Marin Olp L wrote: Medication: oxyCODONE-acetaminophen (PERCOCET) 10-325 MG tablet   Has the patient contacted their pharmacy? Yes.   (Agent: If no, request that the patient contact the pharmacy for the refill.) (Agent: If yes, when and what did the pharmacy advise?)  Preferred Pharmacy (with phone number or street name): [facility] [address] [place] [address] [place] [address] Phone: [phone] Fax: [phone]  Agent: Please be advised that RX refills may take up to 3 business days. We ask that you follow-up with your pharmacy.

## 2018-09-07 NOTE — Patient Instructions (Signed)

## 2018-09-07 NOTE — Progress Notes (Signed)
Subjective:  Patient ID: Jonathan Luna, male    DOB: [birth year]  Age: 52 y.o. MRN: [id number]  CC: Hypothyroidism; Hyperlipidemia; Hypertension; Back Pain; and Depression   HPI Jonathan Luna presents for f/up - He complains of chronic, unchanged, nonradiating low back pain.  He gets symptom relief with a combination of oxycodone and acetaminophen.  He requests a refill.    For the last 2 months he has not been performing any lifestyle modifications and is concerned that his blood pressure is too high.  Outpatient Medications Prior to Visit  Medication Sig Dispense Refill  . aspirin 81 MG tablet Take 81 mg by mouth daily.    . clonazePAM (KLONOPIN) 1 MG tablet TAKE ONE TABLET BY MOUTH TWICE DAILY as needed for anxiety 60 tablet 1  . mometasone (NASONEX) 50 MCG/ACT nasal spray PLACE TWO SPRAYS INTO THE NOSE DAILY 51 g 1  . omega-3 acid ethyl esters (LOVAZA) 1 g capsule TAKE TWO CAPSULES BY MOUTH TWICE DAILY  360 capsule 0  . FLUoxetine (PROZAC) 20 MG tablet Take 1 tablet (20 mg total) by mouth daily. 90 tablet 1  . levothyroxine (SYNTHROID, LEVOTHROID) 200 MCG tablet TAKE ONE TABLET BY MOUTH ONE TIME DAILY BEFORE BREAKFAST 90 tablet 0  . oxyCODONE-acetaminophen (PERCOCET) 10-325 MG tablet Take 1 tablet by mouth every 4 (four) hours as needed for pain. 100 tablet 0   No facility-administered medications prior to visit.     ROS Review of Systems  Constitutional: Positive for unexpected weight change. Negative for diaphoresis and fatigue.  HENT: Negative.   Eyes: Negative for visual disturbance.  Respiratory: Negative for apnea, cough, chest tightness, shortness of breath and wheezing.   Cardiovascular: Negative for chest pain, palpitations and leg swelling.  Gastrointestinal: Negative for abdominal pain, constipation, diarrhea, nausea and vomiting.  Endocrine: Negative.   Genitourinary: Negative.  Negative for difficulty urinating and dysuria.  Musculoskeletal: Positive for back pain.  Negative for arthralgias, myalgias and neck pain.  Skin: Negative.   Neurological: Negative.  Negative for dizziness, weakness, light-headedness, numbness and headaches.  Hematological: Negative for adenopathy. Does not bruise/bleed easily.  Psychiatric/Behavioral: Positive for dysphoric mood and sleep disturbance. Negative for agitation, behavioral problems, confusion, decreased concentration, self-injury and suicidal ideas. The patient is nervous/anxious.        Complains of apathy, irritability, anxiety, and fatigue.  He wants to start taking an antidepressant.    Objective:  BP (!) 142/94 (BP Location: Left Arm, Patient Position: Sitting, Cuff Size: Large)   Pulse 80   Temp 97.7 F (36.5 C) (Oral)   Resp 16   Ht 5\' 8"  (1.727 m)   Wt 216 lb (98 kg)   SpO2 95%   BMI 32.84 kg/m   BP Readings from Last 3 Encounters:  09/07/18 (!) 142/94  06/07/18 140/90  04/07/18 136/90    Wt Readings from Last 3 Encounters:  09/07/18 216 lb (98 kg)  06/07/18 210 lb (95.3 kg)  04/07/18 205 lb 9.6 oz (93.3 kg)    Physical Exam  Constitutional: No distress.  HENT:  Mouth/Throat: Oropharynx is clear and moist. No oropharyngeal exudate.  Eyes: Conjunctivae are normal. No scleral icterus.  Neck: Normal range of motion. Neck supple. No JVD present. No thyromegaly present.  Cardiovascular: Normal rate, regular rhythm and normal heart sounds.  No murmur heard. Pulmonary/Chest: Effort normal and breath sounds normal. No respiratory distress. He has no wheezes. He has no rales.  Abdominal: Soft. Bowel sounds are normal. He exhibits  no mass. There is no tenderness.  Lymphadenopathy:    He has no cervical adenopathy.  Neurological: He displays normal reflexes.  Reflex Scores:      Tricep reflexes are 1+ on the right side and 1+ on the left side.      Bicep reflexes are 1+ on the right side and 1+ on the left side.      Brachioradialis reflexes are 1+ on the right side and 1+ on the left side.       Patellar reflexes are 2+ on the right side and 2+ on the left side.      Achilles reflexes are 1+ on the right side and 1+ on the left side. Neg SLR in BLE  Skin: No rash noted. He is not diaphoretic.  Psychiatric: His behavior is normal. Judgment and thought content normal. His mood appears anxious. His speech is not rapid and/or pressured, not delayed, not tangential and not slurred. He is not aggressive, not slowed, not withdrawn and not actively hallucinating. Thought content is not paranoid. Cognition and memory are normal. He exhibits a depressed mood. He expresses no homicidal and no suicidal ideation. He is communicative. He is attentive.  Vitals reviewed.   Lab Results  Component Value Date   WBC 8.5 12/07/2017   HGB 15.0 12/07/2017   HCT 43.3 12/07/2017   PLT 328.0 12/07/2017   GLUCOSE 98 09/07/2018   CHOL 216 (H) 09/07/2018   TRIG 249.0 (H) 09/07/2018   HDL 42.20 09/07/2018   LDLDIRECT 151.0 09/07/2018   LDLCALC 106 (H) 04/07/2017   ALT 30 12/07/2017   AST 21 12/07/2017   NA 136 09/07/2018   K 4.2 09/07/2018   CL 99 09/07/2018   CREATININE 0.92 09/07/2018   BUN 12 09/07/2018   CO2 27 09/07/2018   TSH 0.05 (L) 09/07/2018   PSA 2.36 12/07/2017   HGBA1C 5.2 07/28/2012    [name]  Result Date: 02/15/2016 CLINICAL DATA:  52 year old male with chronic symptoms getting worse. Bilateral lower back pain restricting motion. Burning and anterior left high. Initial encounter. EXAM: MRI LUMBAR SPINE WITHOUT CONTRAST TECHNIQUE: Multiplanar, multisequence MR imaging of the lumbar spine was performed. No intravenous contrast was administered. COMPARISON:  No comparison MR. 04/15/2007 CT abdomen and pelvis. FINDINGS: Last fully open disk space is labeled L5-S1. Present examination incorporates from T11 through upper S3 level. Conus upper L1 level. Remote Schmorl's node deformity L4 superior endplate. Questioned left T11 1 cm nonspecific osseous lesion. It is possible  this is related to the rib articulation as versus primary osseous lesion. No other worrisome bony lesions or history of cancer to suggest this is a significant finding. Minimal bulge T10-11 level suspected. T11-12 and T12-L1 unremarkable. L1-2:  Minimal retrolisthesis L1.  Minimal bulge. L2-3: Disc degeneration with mild adjacent endplate reactive changes. Bulge. No significant spinal stenosis or foraminal narrowing. L3-4: Minimal to mild bulge. No significant spinal stenosis or foraminal narrowing. L4-5: Moderate facet degenerative changes. Bulge. Very mild foraminal narrowing without nerve root compression. No significant spinal stenosis. L5-S1:  Mild facet degenerative changes. IMPRESSION: Degenerative changes throughout the lumbar spine most notable L4-5 level however, no significant spinal stenosis, foraminal narrowing or nerve root compression detected. Please see above for further detail. Electronically Signed   By: Genia Del M.D.   On: 02/15/2016 21:35    Assessment & Plan:   Yaakov was seen today for hypothyroidism, hyperlipidemia, hypertension, back pain and depression.  Diagnoses and all orders for this  visit:  Acquired hypothyroidism- His TSH is suppressed so I have asked him to lower his dose of levothyroxine. -     Cancel: TSH; Future -     TSH; Future -     levothyroxine (SYNTHROID) 175 MCG tablet; Take 1 tablet (175 mcg total) by mouth daily before breakfast.  Pure hyperglyceridemia- His triglycerides are mildly elevated.  I have asked to improve his lifestyle modifications. -     Lipid panel; Future  Hyperlipidemia with target LDL less than 130- He does not have an elevated ASCVD risk score so I do not recommend that he take a statin for CV risk reduction. -     Lipid panel; Future  Encounter for long-term opiate analgesic use-we will monitor for compliance and will screen for substance and alcohol abuse. -     Pain Mgmt, Profile 8 w/Conf, U; Future  Essential hypertension-  He has developed stage I hypertension.  I will treat the vitamin D deficiency.  Otherwise his labs are negative for secondary causes or endorgan damage.  In addition to lifestyle modifications have asked him to start taking an ARB for this. -     Azilsartan Medoxomil (EDARBI) 80 MG TABS; Take 1 tablet (80 mg total) by mouth daily. -     Basic metabolic panel; Future -     VITAMIN D 25 Hydroxy (Vit-D Deficiency, Fractures); Future -     Urinalysis, Routine w reflex microscopic; Future  Anxiety and depression -     DULoxetine (CYMBALTA) 30 MG capsule; Take 1 capsule (30 mg total) by mouth daily.  DDD (degenerative disc disease), lumbar -     oxyCODONE-acetaminophen (PERCOCET) 10-325 MG tablet; Take 1 tablet by mouth every 4 (four) hours as needed for pain.  Chronic low back pain without sciatica, unspecified back pain laterality -     oxyCODONE-acetaminophen (PERCOCET) 10-325 MG tablet; Take 1 tablet by mouth every 4 (four) hours as needed for pain.  Vitamin D deficiency disease -     Cholecalciferol 50 MCG (2000 UT) TABS; Take 2 tablets (4,000 Units total) by mouth daily.   I have discontinued Phil L. Gallen's FLUoxetine and levothyroxine. I am also having him start on Azilsartan Medoxomil, DULoxetine, levothyroxine, and Cholecalciferol. Additionally, I am having him maintain his aspirin, omega-3 acid ethyl esters, clonazePAM, mometasone, and oxyCODONE-acetaminophen.  Meds ordered this encounter  Medications  . Azilsartan Medoxomil (EDARBI) 80 MG TABS    Sig: Take 1 tablet (80 mg total) by mouth daily.    Dispense:  70 tablet    Refill:  0  . DULoxetine (CYMBALTA) 30 MG capsule    Sig: Take 1 capsule (30 mg total) by mouth daily.    Dispense:  30 capsule    Refill:  0  . oxyCODONE-acetaminophen (PERCOCET) 10-325 MG tablet    Sig: Take 1 tablet by mouth every 4 (four) hours as needed for pain.    Dispense:  100 tablet    Refill:  0  . levothyroxine (SYNTHROID) 175 MCG tablet    Sig:  Take 1 tablet (175 mcg total) by mouth daily before breakfast.    Dispense:  90 tablet    Refill:  0  . Cholecalciferol 50 MCG (2000 UT) TABS    Sig: Take 2 tablets (4,000 Units total) by mouth daily.    Dispense:  180 tablet    Refill:  1     Follow-up: Return in about 3 months (around 12/08/2018).  Scarlette Calico, MD

## 2018-09-08 ENCOUNTER — Encounter: Payer: Self-pay | Admitting: Internal Medicine

## 2018-09-08 DIAGNOSIS — E559 Vitamin D deficiency, unspecified: Secondary | ICD-10-CM | POA: Insufficient documentation

## 2018-09-08 LAB — VITAMIN D 25 HYDROXY (VIT D DEFICIENCY, FRACTURES): VITD: 24.02 ng/mL — ABNORMAL LOW (ref 30.00–100.00)

## 2018-09-08 MED ORDER — CHOLECALCIFEROL 50 MCG (2000 UT) PO TABS: 2.0000 | ORAL_TABLET | Freq: Every day | ORAL | 1 refills | Status: DC

## 2018-09-08 MED ORDER — OXYCODONE-ACETAMINOPHEN 10-325 MG PO TABS: 1.0000 | ORAL_TABLET | ORAL | 0 refills | Status: DC | PRN

## 2018-09-08 MED ORDER — LEVOTHYROXINE SODIUM 175 MCG PO TABS: 175.0000 ug | ORAL_TABLET | Freq: Every day | ORAL | 0 refills | Status: DC

## 2018-09-11 LAB — PAIN MGMT, PROFILE 8 W/CONF, U
6 ACETYLMORPHINE: NEGATIVE ng/mL (ref <10)
ALPHAHYDROXYTRIAZOLAM: NEGATIVE ng/mL (ref <50)
Alcohol Metabolites: NEGATIVE ng/mL (ref <500)
Alphahydroxyalprazolam: NEGATIVE ng/mL (ref <25)
Alphahydroxymidazolam: NEGATIVE ng/mL (ref <50)
Aminoclonazepam: 302 ng/mL — ABNORMAL HIGH (ref <25)
Amphetamines: NEGATIVE ng/mL (ref <500)
Benzodiazepines: POSITIVE ng/mL — AB (ref <100)
Buprenorphine, Urine: NEGATIVE ng/mL (ref <5)
CREATININE: 47.7 mg/dL
Cocaine Metabolite: NEGATIVE ng/mL (ref <150)
Codeine: NEGATIVE ng/mL (ref <50)
HYDROCODONE: NEGATIVE ng/mL (ref <50)
HYDROMORPHONE: NEGATIVE ng/mL (ref <50)
HYDROXYETHYLFLURAZEPAM: NEGATIVE ng/mL (ref <50)
Lorazepam: NEGATIVE ng/mL (ref <50)
MARIJUANA METABOLITE: NEGATIVE ng/mL (ref <20)
MDMA: NEGATIVE ng/mL (ref <500)
Morphine: NEGATIVE ng/mL (ref <50)
NOROXYCODONE: 2883 ng/mL — AB (ref <50)
Nordiazepam: NEGATIVE ng/mL (ref <50)
Norhydrocodone: NEGATIVE ng/mL (ref <50)
OXAZEPAM: NEGATIVE ng/mL (ref <50)
OXYMORPHONE: 233 ng/mL — AB (ref <50)
Opiates: NEGATIVE ng/mL (ref <100)
Oxidant: NEGATIVE ug/mL (ref <200)
Oxycodone: 1642 ng/mL — ABNORMAL HIGH (ref <50)
Oxycodone: POSITIVE ng/mL — AB (ref <100)
PH: 6.53 (ref 4.5–9.0)
Temazepam: NEGATIVE ng/mL (ref <50)

## 2018-09-17 ENCOUNTER — Other Ambulatory Visit: Payer: Self-pay | Admitting: Internal Medicine

## 2018-09-17 DIAGNOSIS — F411 Generalized anxiety disorder: Secondary | ICD-10-CM

## 2018-10-01 ENCOUNTER — Encounter: Payer: Self-pay | Admitting: Internal Medicine

## 2018-10-05 ENCOUNTER — Other Ambulatory Visit: Payer: Self-pay | Admitting: Family

## 2018-10-05 DIAGNOSIS — M545 Low back pain, unspecified: Secondary | ICD-10-CM

## 2018-10-05 DIAGNOSIS — G8929 Other chronic pain: Secondary | ICD-10-CM

## 2018-10-05 DIAGNOSIS — M5136 Other intervertebral disc degeneration, lumbar region: Secondary | ICD-10-CM

## 2018-10-05 MED ORDER — OXYCODONE-ACETAMINOPHEN 10-325 MG PO TABS: 1.0000 | ORAL_TABLET | ORAL | 0 refills | Status: DC | PRN

## 2018-10-05 NOTE — Telephone Encounter (Signed)
Key: [id number]   PA has been approved.

## 2018-10-08 ENCOUNTER — Other Ambulatory Visit: Payer: Self-pay | Admitting: Internal Medicine

## 2018-10-08 DIAGNOSIS — F32A Depression, unspecified: Secondary | ICD-10-CM

## 2018-10-08 DIAGNOSIS — F419 Anxiety disorder, unspecified: Principal | ICD-10-CM

## 2018-10-08 DIAGNOSIS — F329 Major depressive disorder, single episode, unspecified: Secondary | ICD-10-CM

## 2018-11-03 ENCOUNTER — Encounter: Payer: Self-pay | Admitting: Internal Medicine

## 2018-11-08 ENCOUNTER — Other Ambulatory Visit: Payer: Self-pay | Admitting: Internal Medicine

## 2018-11-08 DIAGNOSIS — G894 Chronic pain syndrome: Secondary | ICD-10-CM

## 2018-11-08 DIAGNOSIS — M545 Low back pain, unspecified: Secondary | ICD-10-CM

## 2018-11-08 DIAGNOSIS — G8929 Other chronic pain: Secondary | ICD-10-CM

## 2018-11-08 DIAGNOSIS — F419 Anxiety disorder, unspecified: Principal | ICD-10-CM

## 2018-11-08 DIAGNOSIS — F329 Major depressive disorder, single episode, unspecified: Secondary | ICD-10-CM

## 2018-11-08 DIAGNOSIS — M5136 Other intervertebral disc degeneration, lumbar region: Secondary | ICD-10-CM

## 2018-11-08 DIAGNOSIS — F411 Generalized anxiety disorder: Secondary | ICD-10-CM

## 2018-11-08 DIAGNOSIS — F32A Depression, unspecified: Secondary | ICD-10-CM

## 2018-11-08 MED ORDER — DULOXETINE HCL 60 MG PO CPEP: 60.0000 mg | ORAL_CAPSULE | Freq: Every day | ORAL | 1 refills | Status: DC

## 2018-11-08 MED ORDER — OXYCODONE-ACETAMINOPHEN 10-325 MG PO TABS: 1.0000 | ORAL_TABLET | ORAL | 0 refills | Status: DC | PRN

## 2018-11-19 ENCOUNTER — Other Ambulatory Visit: Payer: Self-pay | Admitting: Internal Medicine

## 2018-11-19 DIAGNOSIS — E781 Pure hyperglyceridemia: Secondary | ICD-10-CM

## 2018-11-19 DIAGNOSIS — E039 Hypothyroidism, unspecified: Secondary | ICD-10-CM

## 2018-12-02 ENCOUNTER — Encounter: Payer: Self-pay | Admitting: Internal Medicine

## 2018-12-08 ENCOUNTER — Other Ambulatory Visit: Payer: Self-pay | Admitting: Internal Medicine

## 2018-12-08 DIAGNOSIS — M5136 Other intervertebral disc degeneration, lumbar region: Secondary | ICD-10-CM

## 2018-12-08 DIAGNOSIS — G8929 Other chronic pain: Secondary | ICD-10-CM

## 2018-12-08 DIAGNOSIS — M545 Low back pain, unspecified: Secondary | ICD-10-CM

## 2018-12-08 MED ORDER — OXYCODONE-ACETAMINOPHEN 10-325 MG PO TABS: 1.0000 | ORAL_TABLET | ORAL | 0 refills | Status: DC | PRN

## 2018-12-14 ENCOUNTER — Other Ambulatory Visit (INDEPENDENT_AMBULATORY_CARE_PROVIDER_SITE_OTHER): Payer: BLUE CROSS/BLUE SHIELD

## 2018-12-14 ENCOUNTER — Encounter: Payer: Self-pay | Admitting: Internal Medicine

## 2018-12-14 ENCOUNTER — Ambulatory Visit: Payer: BLUE CROSS/BLUE SHIELD | Admitting: Internal Medicine

## 2018-12-14 VITALS — BP 142/96 | HR 88 | Temp 97.8°F | Resp 16 | Ht 68.0 in | Wt 213.2 lb

## 2018-12-14 DIAGNOSIS — Z125 Encounter for screening for malignant neoplasm of prostate: Secondary | ICD-10-CM | POA: Diagnosis not present

## 2018-12-14 DIAGNOSIS — Z79891 Long term (current) use of opiate analgesic: Secondary | ICD-10-CM

## 2018-12-14 DIAGNOSIS — E039 Hypothyroidism, unspecified: Secondary | ICD-10-CM | POA: Diagnosis not present

## 2018-12-14 DIAGNOSIS — I10 Essential (primary) hypertension: Secondary | ICD-10-CM

## 2018-12-14 DIAGNOSIS — Z0000 Encounter for general adult medical examination without abnormal findings: Secondary | ICD-10-CM

## 2018-12-14 LAB — URINALYSIS, ROUTINE W REFLEX MICROSCOPIC
Bilirubin Urine: NEGATIVE
HGB URINE DIPSTICK: NEGATIVE
Ketones, ur: NEGATIVE
Leukocytes,Ua: NEGATIVE
Nitrite: NEGATIVE
RBC / HPF: NONE SEEN (ref 0–?)
Specific Gravity, Urine: <=1.005 — AB (ref 1.000–1.030)
TOTAL PROTEIN, URINE-UPE24: NEGATIVE
Urine Glucose: NEGATIVE
Urobilinogen, UA: 0.2 (ref 0.0–1.0)
WBC, UA: NONE SEEN (ref 0–?)
pH: 6 (ref 5.0–8.0)

## 2018-12-14 LAB — COMPREHENSIVE METABOLIC PANEL
ALT: 29 U/L (ref 0–53)
AST: 17 U/L (ref 0–37)
Albumin: 4.8 g/dL (ref 3.5–5.2)
Alkaline Phosphatase: 56 U/L (ref 39–117)
BILIRUBIN TOTAL: 0.7 mg/dL (ref 0.2–1.2)
BUN: 13 mg/dL (ref 6–23)
CO2: 27 meq/L (ref 19–32)
Calcium: 9.8 mg/dL (ref 8.4–10.5)
Chloride: 100 mEq/L (ref 96–112)
Creatinine, Ser: 0.97 mg/dL (ref 0.40–1.50)
GFR: 81.07 mL/min (ref >60.00)
Glucose, Bld: 84 mg/dL (ref 70–99)
Potassium: 4.4 mEq/L (ref 3.5–5.1)
Sodium: 135 mEq/L (ref 135–145)
Total Protein: 7.5 g/dL (ref 6.0–8.3)

## 2018-12-14 LAB — CBC WITH DIFFERENTIAL/PLATELET
Basophils Absolute: 0.1 10*3/uL (ref 0.0–0.1)
Basophils Relative: 0.8 % (ref 0.0–3.0)
Eosinophils Absolute: 0.2 10*3/uL (ref 0.0–0.7)
Eosinophils Relative: 1.8 % (ref 0.0–5.0)
HCT: 43.1 % (ref 39.0–52.0)
Hemoglobin: 14.6 g/dL (ref 13.0–17.0)
Lymphocytes Relative: 34.8 % (ref 12.0–46.0)
Lymphs Abs: 3.5 10*3/uL (ref 0.7–4.0)
MCHC: 33.8 g/dL (ref 30.0–36.0)
MCV: 91.7 fl (ref 78.0–100.0)
Monocytes Absolute: 0.9 10*3/uL (ref 0.1–1.0)
Monocytes Relative: 9.1 % (ref 3.0–12.0)
NEUTROS ABS: 5.4 10*3/uL (ref 1.4–7.7)
Neutrophils Relative %: 53.5 % (ref 43.0–77.0)
PLATELETS: 345 10*3/uL (ref 150.0–400.0)
RBC: 4.7 Mil/uL (ref 4.22–5.81)
RDW: 13.8 % (ref 11.5–15.5)
WBC: 10.2 10*3/uL (ref 4.0–10.5)

## 2018-12-14 LAB — PSA: PSA: 1.52 ng/mL (ref 0.10–4.00)

## 2018-12-14 LAB — TSH: TSH: 0.45 u[IU]/mL (ref 0.35–4.50)

## 2018-12-14 MED ORDER — AZILSARTAN MEDOXOMIL 80 MG PO TABS: 1.0000 | ORAL_TABLET | Freq: Every day | ORAL | 1 refills | Status: DC

## 2018-12-14 NOTE — Patient Instructions (Signed)

## 2018-12-14 NOTE — Progress Notes (Signed)
Subjective:  Patient ID: Jonathan Luna, male    DOB: Oct 17, 1965  Age: 53 y.o. MRN: [id number]  CC: Annual Exam; Hypothyroidism; Hypertension; and Back Pain   HPI [name] presents for a CPX.  He is not taking the ARB.  He says he ran out a few weeks ago.  He does not monitor his blood pressure.  He complains of weight gain but denies CP or DOE.  Outpatient Medications Prior to Visit  Medication Sig Dispense Refill  . aspirin 81 MG tablet Take 81 mg by mouth daily.    . Cholecalciferol 50 MCG (2000 UT) TABS Take 2 tablets (4,000 Units total) by mouth daily. 180 tablet 1  . clonazePAM (KLONOPIN) 1 MG tablet TAKE ONE TABLET BY MOUTH TWICE A DAY AS NEEDED FOR ANXIETY  60 tablet 3  . DULoxetine (CYMBALTA) 60 MG capsule Take 1 capsule (60 mg total) by mouth daily. 90 capsule 1  . levothyroxine (SYNTHROID, LEVOTHROID) 175 MCG tablet TAKE ONE TABLET BY MOUTH ONE TIME DAILY BEFORE BREAKFAST 90 tablet 0  . mometasone (NASONEX) 50 MCG/ACT nasal spray PLACE TWO SPRAYS INTO THE NOSE DAILY 51 g 1  . omega-3 acid ethyl esters (LOVAZA) 1 g capsule TAKE TWO CAPSULES BY MOUTH TWICE DAILY  360 capsule 0  . oxyCODONE-acetaminophen (PERCOCET) 10-325 MG tablet Take 1 tablet by mouth every 4 (four) hours as needed for pain. 100 tablet 0  . Azilsartan Medoxomil (EDARBI) 80 MG TABS Take 1 tablet (80 mg total) by mouth daily. (Patient not taking: Reported on 12/14/2018) 70 tablet 0   No facility-administered medications prior to visit.     ROS Review of Systems  Constitutional: Positive for unexpected weight change. Negative for appetite change, diaphoresis and fatigue.  HENT: Negative.  Negative for trouble swallowing.   Eyes: Negative for visual disturbance.  Respiratory: Negative for cough, chest tightness, shortness of breath and wheezing.   Cardiovascular: Negative for chest pain and palpitations.  Gastrointestinal: Negative for abdominal pain, constipation, diarrhea, nausea and vomiting.  Endocrine:  Negative.  Negative for cold intolerance and heat intolerance.  Genitourinary: Negative.  Negative for difficulty urinating, dysuria, scrotal swelling, testicular pain and urgency.  Musculoskeletal: Positive for back pain. Negative for arthralgias, myalgias and neck pain.       , Unchanged, nonradiating low back pain.  Skin: Negative for color change and rash.  Neurological: Negative.  Negative for dizziness, weakness, light-headedness, numbness and headaches.  Hematological: Negative for adenopathy. Does not bruise/bleed easily.  Psychiatric/Behavioral: Negative for behavioral problems, decreased concentration, dysphoric mood, sleep disturbance and suicidal ideas. The patient is nervous/anxious.     Objective:  BP (!) 142/96 (BP Location: Left Arm, Patient Position: Sitting, Cuff Size: Large)   Pulse 88   Temp 97.8 F (36.6 C) (Oral)   Resp 16   Ht 5\' 8"  (1.727 m)   Wt 213 lb 4 oz (96.7 kg)   SpO2 96%   BMI 32.42 kg/m   BP Readings from Last 3 Encounters:  12/14/18 (!) 142/96  09/07/18 (!) 142/94  06/07/18 140/90    Wt Readings from Last 3 Encounters:  12/14/18 213 lb 4 oz (96.7 kg)  09/07/18 216 lb (98 kg)  06/07/18 210 lb (95.3 kg)    Physical Exam Vitals signs reviewed.  Constitutional:      Appearance: He is obese. He is not ill-appearing.  HENT:     Nose: Nose normal. No congestion or rhinorrhea.     Mouth/Throat:  Mouth: Mucous membranes are moist.     Pharynx: Oropharynx is clear. No oropharyngeal exudate or posterior oropharyngeal erythema.  Eyes:     General: No scleral icterus.    Conjunctiva/sclera: Conjunctivae normal.  Neck:     Musculoskeletal: Normal range of motion and neck supple. No neck rigidity.  Cardiovascular:     Rate and Rhythm: Normal rate and regular rhythm.     Heart sounds: No murmur. No gallop.      Comments: EKG ---  Sinus  Rhythm  WITHIN NORMAL LIMITS Pulmonary:     Effort: Pulmonary effort is normal. No respiratory distress.       Breath sounds: No stridor. No wheezing or rales.  Abdominal:     General: Bowel sounds are normal.     Palpations: There is no hepatomegaly, splenomegaly or mass.     Tenderness: There is no abdominal tenderness. There is no guarding.     Hernia: There is no hernia in the right inguinal area or left inguinal area.  Genitourinary:    Pubic Area: No rash.      Penis: Normal. No discharge, swelling or lesions.      Scrotum/Testes: Normal.        Right: Mass or tenderness not present.        Left: Mass or tenderness not present.     Epididymis:     Right: Normal. No mass.     Left: Normal. No mass.     Prostate: Normal. Not enlarged, not tender and no nodules present.     Rectum: Normal. Guaiac result negative. No mass, tenderness, anal fissure, external hemorrhoid or internal hemorrhoid. Normal anal tone.  Musculoskeletal: Normal range of motion.  Lymphadenopathy:     Cervical: No cervical adenopathy.     Lower Body: No right inguinal adenopathy. No left inguinal adenopathy.  Skin:    General: Skin is warm and dry.  Neurological:     General: No focal deficit present.     Mental Status: He is oriented to person, place, and time. Mental status is at baseline.  Psychiatric:        Mood and Affect: Mood normal.        Behavior: Behavior normal.        Thought Content: Thought content normal.        Judgment: Judgment normal.     Lab Results  Component Value Date   WBC 10.2 12/14/2018   HGB 14.6 12/14/2018   HCT 43.1 12/14/2018   PLT 345.0 12/14/2018   GLUCOSE 84 12/14/2018   CHOL 216 (H) 09/07/2018   TRIG 249.0 (H) 09/07/2018   HDL 42.20 09/07/2018   LDLDIRECT 151.0 09/07/2018   LDLCALC 106 (H) 04/07/2017   ALT 29 12/14/2018   AST 17 12/14/2018   NA 135 12/14/2018   K 4.4 12/14/2018   CL 100 12/14/2018   CREATININE 0.97 12/14/2018   BUN 13 12/14/2018   CO2 27 12/14/2018   TSH 0.45 12/14/2018   PSA 1.52 12/14/2018   HGBA1C 5.2 07/28/2012    Mr Lumbar Spine Wo  Contrast  Result Date: 02/15/2016 CLINICAL DATA:  53 year old male with chronic symptoms getting worse. Bilateral lower back pain restricting motion. Burning and anterior left high. Initial encounter. EXAM: MRI LUMBAR SPINE WITHOUT CONTRAST TECHNIQUE: Multiplanar, multisequence MR imaging of the lumbar spine was performed. No intravenous contrast was administered. COMPARISON:  No comparison MR. 04/15/2007 CT abdomen and pelvis. FINDINGS: Last fully open disk space is labeled L5-S1. Present examination  incorporates from T11 through upper S3 level. Conus upper L1 level. Remote Schmorl's node deformity L4 superior endplate. Questioned left T11 1 cm nonspecific osseous lesion. It is possible this is related to the rib articulation as versus primary osseous lesion. No other worrisome bony lesions or history of cancer to suggest this is a significant finding. Minimal bulge T10-11 level suspected. T11-12 and T12-L1 unremarkable. L1-2:  Minimal retrolisthesis L1.  Minimal bulge. L2-3: Disc degeneration with mild adjacent endplate reactive changes. Bulge. No significant spinal stenosis or foraminal narrowing. L3-4: Minimal to mild bulge. No significant spinal stenosis or foraminal narrowing. L4-5: Moderate facet degenerative changes. Bulge. Very mild foraminal narrowing without nerve root compression. No significant spinal stenosis. L5-S1:  Mild facet degenerative changes. IMPRESSION: Degenerative changes throughout the lumbar spine most notable L4-5 level however, no significant spinal stenosis, foraminal narrowing or nerve root compression detected. Please see above for further detail. Electronically Signed   By: Genia Del M.D.   On: 02/15/2016 21:35    Assessment & Plan:   Thoams was seen today for annual exam, hypothyroidism, hypertension and back pain.  Diagnoses and all orders for this visit:  Acquired hypothyroidism- His TSH is in the normal range.  He will remain on the current dose of levothyroxine. -      TSH; Future  Essential hypertension- His BP is not adequately well controlled due to noncompliance.  His labs are negative for secondary causes or endorgan damage.  EKG is negative for LVH or ischemia.  I have asked him to restart the ARB. -     Comprehensive metabolic panel; Future -     CBC with Differential/Platelet; Future -     Azilsartan Medoxomil (EDARBI) 80 MG TABS; Take 1 tablet (80 mg total) by mouth daily. -     Urinalysis, Routine w reflex microscopic; Future -     EKG 12-Lead  Routine general medical examination at a health care facility- Exam completed, labs reviewed, vaccines reviewed and updated, screening for colon cancer is up-to-date, patient education material was given. -     Lipid panel; Future -     PSA; Future  Encounter for long-term opiate analgesic use- I will check a urine drug screen to monitor for compliance and to screen for alcohol or substance abuse. -     Pain Mgmt, Profile 8 w/Conf, U; Future   I am having Ruffin Frederick maintain his aspirin, mometasone, Cholecalciferol, clonazePAM, DULoxetine, omega-3 acid ethyl esters, levothyroxine, oxyCODONE-acetaminophen, and Azilsartan Medoxomil.  Meds ordered this encounter  Medications  . Azilsartan Medoxomil (EDARBI) 80 MG TABS    Sig: Take 1 tablet (80 mg total) by mouth daily.    Dispense:  90 tablet    Refill:  1     Follow-up: Return in about 3 months (around 03/16/2019).  Scarlette Calico, MD

## 2018-12-15 ENCOUNTER — Encounter: Payer: Self-pay | Admitting: Internal Medicine

## 2018-12-17 ENCOUNTER — Encounter: Payer: Self-pay | Admitting: Internal Medicine

## 2018-12-17 LAB — PAIN MGMT, PROFILE 8 W/CONF, U
6 ACETYLMORPHINE: NEGATIVE ng/mL (ref <10)
Alcohol Metabolites: NEGATIVE ng/mL (ref <500)
Amphetamines: NEGATIVE ng/mL (ref <500)
Benzodiazepines: NEGATIVE ng/mL (ref <100)
Buprenorphine, Urine: NEGATIVE ng/mL (ref <5)
Cocaine Metabolite: NEGATIVE ng/mL (ref <150)
Creatinine: 22.7 mg/dL
MDMA: NEGATIVE ng/mL (ref <500)
Marijuana Metabolite: NEGATIVE ng/mL (ref <20)
NOROXYCODONE: 961 ng/mL — AB (ref <50)
Opiates: NEGATIVE ng/mL (ref <100)
Oxidant: NEGATIVE ug/mL (ref <200)
Oxycodone: 502 ng/mL — ABNORMAL HIGH (ref <50)
Oxycodone: POSITIVE ng/mL — AB (ref <100)
Oxymorphone: 120 ng/mL — ABNORMAL HIGH (ref <50)
pH: 6.11 (ref 4.5–9.0)

## 2018-12-30 ENCOUNTER — Encounter: Payer: Self-pay | Admitting: Internal Medicine

## 2019-01-06 ENCOUNTER — Other Ambulatory Visit: Payer: Self-pay | Admitting: Internal Medicine

## 2019-01-06 DIAGNOSIS — M5136 Other intervertebral disc degeneration, lumbar region: Secondary | ICD-10-CM

## 2019-01-06 DIAGNOSIS — M545 Low back pain, unspecified: Secondary | ICD-10-CM

## 2019-01-06 DIAGNOSIS — G8929 Other chronic pain: Secondary | ICD-10-CM

## 2019-01-06 MED ORDER — OXYCODONE-ACETAMINOPHEN 10-325 MG PO TABS: 1.0000 | ORAL_TABLET | ORAL | 0 refills | Status: DC | PRN

## 2019-01-17 ENCOUNTER — Other Ambulatory Visit: Payer: Self-pay | Admitting: Internal Medicine

## 2019-01-17 DIAGNOSIS — F411 Generalized anxiety disorder: Secondary | ICD-10-CM

## 2019-01-31 ENCOUNTER — Encounter: Payer: Self-pay | Admitting: Internal Medicine

## 2019-02-03 ENCOUNTER — Other Ambulatory Visit: Payer: Self-pay | Admitting: Internal Medicine

## 2019-02-07 ENCOUNTER — Other Ambulatory Visit: Payer: Self-pay | Admitting: Internal Medicine

## 2019-02-07 DIAGNOSIS — M545 Low back pain, unspecified: Secondary | ICD-10-CM

## 2019-02-07 DIAGNOSIS — G8929 Other chronic pain: Secondary | ICD-10-CM

## 2019-02-07 DIAGNOSIS — M5136 Other intervertebral disc degeneration, lumbar region: Secondary | ICD-10-CM

## 2019-02-07 DIAGNOSIS — J301 Allergic rhinitis due to pollen: Secondary | ICD-10-CM

## 2019-02-07 MED ORDER — OXYCODONE-ACETAMINOPHEN 10-325 MG PO TABS: 1.0000 | ORAL_TABLET | ORAL | 0 refills | Status: DC | PRN

## 2019-02-25 ENCOUNTER — Other Ambulatory Visit: Payer: Self-pay | Admitting: Internal Medicine

## 2019-02-25 DIAGNOSIS — E039 Hypothyroidism, unspecified: Secondary | ICD-10-CM

## 2019-02-25 DIAGNOSIS — E781 Pure hyperglyceridemia: Secondary | ICD-10-CM

## 2019-02-28 MED ORDER — OMEGA-3-ACID ETHYL ESTERS 1 G PO CAPS: 2.0000 | ORAL_CAPSULE | Freq: Two times a day (BID) | ORAL | 0 refills | Status: DC

## 2019-02-28 MED ORDER — LEVOTHYROXINE SODIUM 175 MCG PO TABS: 175.0000 ug | ORAL_TABLET | Freq: Every day | ORAL | 0 refills | Status: DC

## 2019-03-01 ENCOUNTER — Encounter: Payer: Self-pay | Admitting: Internal Medicine

## 2019-03-08 ENCOUNTER — Other Ambulatory Visit: Payer: Self-pay

## 2019-03-08 ENCOUNTER — Encounter: Payer: Self-pay | Admitting: Internal Medicine

## 2019-03-08 ENCOUNTER — Ambulatory Visit (INDEPENDENT_AMBULATORY_CARE_PROVIDER_SITE_OTHER): Payer: BLUE CROSS/BLUE SHIELD | Admitting: Internal Medicine

## 2019-03-08 VITALS — BP 142/92 | HR 85 | Temp 98.2°F | Resp 16 | Ht 68.0 in | Wt 215.0 lb

## 2019-03-08 DIAGNOSIS — G8929 Other chronic pain: Secondary | ICD-10-CM

## 2019-03-08 DIAGNOSIS — E039 Hypothyroidism, unspecified: Secondary | ICD-10-CM | POA: Diagnosis not present

## 2019-03-08 DIAGNOSIS — I10 Essential (primary) hypertension: Secondary | ICD-10-CM | POA: Diagnosis not present

## 2019-03-08 DIAGNOSIS — M545 Low back pain, unspecified: Secondary | ICD-10-CM

## 2019-03-08 DIAGNOSIS — E781 Pure hyperglyceridemia: Secondary | ICD-10-CM

## 2019-03-08 DIAGNOSIS — E785 Hyperlipidemia, unspecified: Secondary | ICD-10-CM

## 2019-03-08 DIAGNOSIS — M5136 Other intervertebral disc degeneration, lumbar region: Secondary | ICD-10-CM

## 2019-03-08 DIAGNOSIS — E559 Vitamin D deficiency, unspecified: Secondary | ICD-10-CM

## 2019-03-08 MED ORDER — OXYCODONE-ACETAMINOPHEN 10-325 MG PO TABS: 1.0000 | ORAL_TABLET | ORAL | 0 refills | Status: DC | PRN

## 2019-03-08 NOTE — Progress Notes (Signed)
Subjective:  Patient ID: Jonathan Luna, male    DOB: July 21, 1966  Age: 53 y.o. MRN: [id number]  CC: Hypertension; Hypothyroidism; Hyperlipidemia; and Back Pain   HPI ADONIS YIM presents for f/up - He continues to complain of low back pain.  He is getting adequate control of the pain with the combination of oxycodone and acetaminophen. The back pain has not recently worsened and does not radiate into his lower extremities.  He denies lower extremities paresthesias.  For the most part his blood pressure has been well controlled.  He has not taken Edarbi over the last few days because he has neglected to pick it up from the pharmacy.  He is active and denies any recent episodes of CP, DOE, diaphoresis, palpitations, edema, or fatigue.  Outpatient Medications Prior to Visit  Medication Sig Dispense Refill  . aspirin 81 MG tablet Take 81 mg by mouth daily.    . Azilsartan Medoxomil (EDARBI) 80 MG TABS Take 1 tablet (80 mg total) by mouth daily. 90 tablet 1  . Cholecalciferol 50 MCG (2000 UT) TABS Take 2 tablets (4,000 Units total) by mouth daily. 180 tablet 1  . clonazePAM (KLONOPIN) 1 MG tablet TAKE 1 TABLET BY MOUTH TWICE A DAY AS NEEDED FOR ANXIETY  60 tablet 3  . DULoxetine (CYMBALTA) 60 MG capsule Take 1 capsule (60 mg total) by mouth daily. 90 capsule 1  . levothyroxine (SYNTHROID) 175 MCG tablet Take 1 tablet (175 mcg total) by mouth daily before breakfast. 90 tablet 0  . mometasone (NASONEX) 50 MCG/ACT nasal spray PLACE TWO SPRAYS INTO THE NOSE DAILY 17 g 11  . omega-3 acid ethyl esters (LOVAZA) 1 g capsule Take 2 capsules (2 g total) by mouth 2 (two) times daily. 360 capsule 0  . oxyCODONE-acetaminophen (PERCOCET) 10-325 MG tablet Take 1 tablet by mouth every 4 (four) hours as needed for pain. 100 tablet 0   No facility-administered medications prior to visit.     ROS Review of Systems  Constitutional: Positive for unexpected weight change (wt gain). Negative for diaphoresis and  fatigue.  HENT: Negative.   Eyes: Negative for visual disturbance.  Respiratory: Negative for cough, chest tightness, shortness of breath and wheezing.   Cardiovascular: Negative for chest pain, palpitations and leg swelling.  Gastrointestinal: Negative for abdominal pain, constipation, diarrhea, nausea and vomiting.  Endocrine: Negative.  Negative for cold intolerance and heat intolerance.  Genitourinary: Negative.  Negative for difficulty urinating, dysuria and hematuria.  Musculoskeletal: Positive for back pain. Negative for myalgias.  Skin: Negative.  Negative for pallor.  Neurological: Negative.  Negative for dizziness, weakness and headaches.  Hematological: Negative for adenopathy. Does not bruise/bleed easily.  Psychiatric/Behavioral: Negative.     Objective:  BP (!) 142/92 (BP Location: Left Arm, Patient Position: Sitting, Cuff Size: Large)   Pulse 85   Temp 98.2 F (36.8 C) (Oral)   Resp 16   Ht 5\' 8"  (1.727 m)   Wt 215 lb (97.5 kg)   SpO2 95%   BMI 32.69 kg/m   BP Readings from Last 3 Encounters:  03/08/19 (!) 142/92  12/14/18 (!) 142/96  09/07/18 (!) 142/94    Wt Readings from Last 3 Encounters:  03/08/19 215 lb (97.5 kg)  12/14/18 213 lb 4 oz (96.7 kg)  09/07/18 216 lb (98 kg)    Physical Exam Vitals signs reviewed.  Constitutional:      Appearance: He is obese. He is not ill-appearing or diaphoretic.  HENT:  Mouth/Throat:     Mouth: Mucous membranes are moist.  Eyes:     General: No scleral icterus.    Conjunctiva/sclera: Conjunctivae normal.  Neck:     Musculoskeletal: Normal range of motion. No neck rigidity.  Cardiovascular:     Rate and Rhythm: Normal rate and regular rhythm.     Heart sounds: No murmur. No gallop.   Pulmonary:     Effort: Pulmonary effort is normal.     Breath sounds: No stridor. No wheezing, rhonchi or rales.  Abdominal:     General: Abdomen is protuberant. Bowel sounds are normal.     Palpations: There is no  hepatomegaly or splenomegaly.     Tenderness: There is no abdominal tenderness.  Musculoskeletal: Normal range of motion.     Lumbar back: Normal. He exhibits normal range of motion and no tenderness.     Right lower leg: No edema.     Left lower leg: No edema.  Lymphadenopathy:     Cervical: No cervical adenopathy.  Skin:    General: Skin is warm and dry.  Neurological:     General: No focal deficit present.     Mental Status: He is alert.     Sensory: Sensation is intact. No sensory deficit.     Motor: Motor function is intact. No weakness.     Coordination: Coordination is intact.     Comments: Neg SLR in BLE  Psychiatric:        Mood and Affect: Mood normal.        Behavior: Behavior normal.     Lab Results  Component Value Date   WBC 10.2 12/14/2018   HGB 14.6 12/14/2018   HCT 43.1 12/14/2018   PLT 345.0 12/14/2018   GLUCOSE 84 12/14/2018   CHOL 216 (H) 09/07/2018   TRIG 249.0 (H) 09/07/2018   HDL 42.20 09/07/2018   LDLDIRECT 151.0 09/07/2018   LDLCALC 106 (H) 04/07/2017   ALT 29 12/14/2018   AST 17 12/14/2018   NA 135 12/14/2018   K 4.4 12/14/2018   CL 100 12/14/2018   CREATININE 0.97 12/14/2018   BUN 13 12/14/2018   CO2 27 12/14/2018   TSH 0.45 12/14/2018   PSA 1.52 12/14/2018   HGBA1C 5.2 07/28/2012    [name]  Result Date: 02/15/2016 CLINICAL DATA:  53 year old male with chronic symptoms getting worse. Bilateral lower back pain restricting motion. Burning and anterior left high. Initial encounter. EXAM: MRI LUMBAR SPINE WITHOUT CONTRAST TECHNIQUE: Multiplanar, multisequence MR imaging of the lumbar spine was performed. No intravenous contrast was administered. COMPARISON:  No comparison MR. 04/15/2007 CT abdomen and pelvis. FINDINGS: Last fully open disk space is labeled L5-S1. Present examination incorporates from T11 through upper S3 level. Conus upper L1 level. Remote Schmorl's node deformity L4 superior endplate. Questioned left T11 1  cm nonspecific osseous lesion. It is possible this is related to the rib articulation as versus primary osseous lesion. No other worrisome bony lesions or history of cancer to suggest this is a significant finding. Minimal bulge T10-11 level suspected. T11-12 and T12-L1 unremarkable. L1-2:  Minimal retrolisthesis L1.  Minimal bulge. L2-3: Disc degeneration with mild adjacent endplate reactive changes. Bulge. No significant spinal stenosis or foraminal narrowing. L3-4: Minimal to mild bulge. No significant spinal stenosis or foraminal narrowing. L4-5: Moderate facet degenerative changes. Bulge. Very mild foraminal narrowing without nerve root compression. No significant spinal stenosis. L5-S1:  Mild facet degenerative changes. IMPRESSION: Degenerative changes throughout the lumbar spine  most notable L4-5 level however, no significant spinal stenosis, foraminal narrowing or nerve root compression detected. Please see above for further detail. Electronically Signed   By: Genia Del M.D.   On: 02/15/2016 21:35    Assessment & Plan:   Reubin was seen today for hypertension, hypothyroidism, hyperlipidemia and back pain.  Diagnoses and all orders for this visit:  Essential hypertension- His blood pressure is not adequately well controlled.  He agrees to be more compliant with Edarbi and to improve his lifestyle modifications.  Pure hyperglyceridemia- I will monitor his triglycerides.  He agrees to improve his lifestyle modifications.  He agrees to continue taking Lovaza. -     Lipid panel; Future  Hyperlipidemia with target LDL less than 130- I will recheck his LDL and will treat with a statin if indicated. -     Lipid panel; Future  Acquired hypothyroidism- I will monitor his TSH and will adjust his T4 dose if indicated. -     TSH; Future  Vitamin D deficiency disease -     VITAMIN D 25 Hydroxy (Vit-D Deficiency, Fractures); Future  DDD (degenerative disc disease), lumbar -      oxyCODONE-acetaminophen (PERCOCET) 10-325 MG tablet; Take 1 tablet by mouth every 4 (four) hours as needed for pain.  Chronic low back pain without sciatica, unspecified back pain laterality -     oxyCODONE-acetaminophen (PERCOCET) 10-325 MG tablet; Take 1 tablet by mouth every 4 (four) hours as needed for pain.   I am having Ruffin Frederick maintain his aspirin, Cholecalciferol, DULoxetine, Azilsartan Medoxomil, clonazePAM, mometasone, omega-3 acid ethyl esters, levothyroxine, and oxyCODONE-acetaminophen.  Meds ordered this encounter  Medications  . oxyCODONE-acetaminophen (PERCOCET) 10-325 MG tablet    Sig: Take 1 tablet by mouth every 4 (four) hours as needed for pain.    Dispense:  100 tablet    Refill:  0     Follow-up: Return in about 4 months (around 07/09/2019).  Scarlette Calico, MD

## 2019-03-08 NOTE — Patient Instructions (Signed)

## 2019-03-09 ENCOUNTER — Encounter: Payer: Self-pay | Admitting: Internal Medicine

## 2019-03-14 ENCOUNTER — Encounter: Payer: Self-pay | Admitting: Internal Medicine

## 2019-03-14 ENCOUNTER — Other Ambulatory Visit (INDEPENDENT_AMBULATORY_CARE_PROVIDER_SITE_OTHER): Payer: BLUE CROSS/BLUE SHIELD

## 2019-03-14 DIAGNOSIS — E559 Vitamin D deficiency, unspecified: Secondary | ICD-10-CM | POA: Diagnosis not present

## 2019-03-14 DIAGNOSIS — E785 Hyperlipidemia, unspecified: Secondary | ICD-10-CM

## 2019-03-14 DIAGNOSIS — E039 Hypothyroidism, unspecified: Secondary | ICD-10-CM | POA: Diagnosis not present

## 2019-03-14 DIAGNOSIS — E781 Pure hyperglyceridemia: Secondary | ICD-10-CM

## 2019-03-15 ENCOUNTER — Encounter: Payer: Self-pay | Admitting: Internal Medicine

## 2019-03-15 ENCOUNTER — Other Ambulatory Visit: Payer: Self-pay | Admitting: Internal Medicine

## 2019-03-15 LAB — LIPID PANEL
Cholesterol: 239 mg/dL — ABNORMAL HIGH (ref 0–200)
HDL: 44.1 mg/dL (ref >39.00)
NonHDL: 195.37
Total CHOL/HDL Ratio: 5
Triglycerides: 325 mg/dL — ABNORMAL HIGH (ref 0.0–149.0)
VLDL: 65 mg/dL — ABNORMAL HIGH (ref 0.0–40.0)

## 2019-03-15 LAB — TSH: TSH: 1.32 u[IU]/mL (ref 0.35–4.50)

## 2019-03-15 LAB — LDL CHOLESTEROL, DIRECT: Direct LDL: 159 mg/dL

## 2019-03-15 LAB — VITAMIN D 25 HYDROXY (VIT D DEFICIENCY, FRACTURES): VITD: 32.65 ng/mL (ref 30.00–100.00)

## 2019-04-01 ENCOUNTER — Encounter: Payer: Self-pay | Admitting: Internal Medicine

## 2019-04-08 ENCOUNTER — Other Ambulatory Visit: Payer: Self-pay | Admitting: Internal Medicine

## 2019-04-08 DIAGNOSIS — M5136 Other intervertebral disc degeneration, lumbar region: Secondary | ICD-10-CM

## 2019-04-08 DIAGNOSIS — M545 Low back pain, unspecified: Secondary | ICD-10-CM

## 2019-04-08 DIAGNOSIS — G8929 Other chronic pain: Secondary | ICD-10-CM

## 2019-04-08 MED ORDER — OXYCODONE-ACETAMINOPHEN 10-325 MG PO TABS: 1.0000 | ORAL_TABLET | ORAL | 0 refills | Status: DC | PRN

## 2019-05-03 ENCOUNTER — Encounter: Payer: Self-pay | Admitting: Internal Medicine

## 2019-05-03 ENCOUNTER — Other Ambulatory Visit: Payer: Self-pay | Admitting: Internal Medicine

## 2019-05-03 DIAGNOSIS — F411 Generalized anxiety disorder: Secondary | ICD-10-CM

## 2019-05-03 DIAGNOSIS — M5136 Other intervertebral disc degeneration, lumbar region: Secondary | ICD-10-CM

## 2019-05-03 DIAGNOSIS — G8929 Other chronic pain: Secondary | ICD-10-CM

## 2019-05-03 DIAGNOSIS — M545 Low back pain, unspecified: Secondary | ICD-10-CM

## 2019-05-03 DIAGNOSIS — G894 Chronic pain syndrome: Secondary | ICD-10-CM

## 2019-05-05 ENCOUNTER — Other Ambulatory Visit: Payer: Self-pay | Admitting: Internal Medicine

## 2019-05-06 ENCOUNTER — Other Ambulatory Visit: Payer: Self-pay | Admitting: Internal Medicine

## 2019-05-06 DIAGNOSIS — M545 Low back pain, unspecified: Secondary | ICD-10-CM

## 2019-05-06 DIAGNOSIS — M5136 Other intervertebral disc degeneration, lumbar region: Secondary | ICD-10-CM

## 2019-05-06 DIAGNOSIS — G8929 Other chronic pain: Secondary | ICD-10-CM

## 2019-05-06 MED ORDER — OXYCODONE-ACETAMINOPHEN 10-325 MG PO TABS: 1.0000 | ORAL_TABLET | ORAL | 0 refills | Status: DC | PRN

## 2019-05-18 ENCOUNTER — Other Ambulatory Visit: Payer: Self-pay | Admitting: Internal Medicine

## 2019-05-18 DIAGNOSIS — F411 Generalized anxiety disorder: Secondary | ICD-10-CM

## 2019-06-03 ENCOUNTER — Encounter: Payer: Self-pay | Admitting: Internal Medicine

## 2019-06-07 ENCOUNTER — Other Ambulatory Visit: Payer: Self-pay | Admitting: Internal Medicine

## 2019-06-07 DIAGNOSIS — M5136 Other intervertebral disc degeneration, lumbar region: Secondary | ICD-10-CM

## 2019-06-07 DIAGNOSIS — M51369 Other intervertebral disc degeneration, lumbar region without mention of lumbar back pain or lower extremity pain: Secondary | ICD-10-CM

## 2019-06-07 DIAGNOSIS — M545 Low back pain, unspecified: Secondary | ICD-10-CM

## 2019-06-07 DIAGNOSIS — G8929 Other chronic pain: Secondary | ICD-10-CM

## 2019-06-07 MED ORDER — OXYCODONE-ACETAMINOPHEN 10-325 MG PO TABS: 1.0000 | ORAL_TABLET | ORAL | 0 refills | Status: DC | PRN

## 2019-06-08 ENCOUNTER — Ambulatory Visit (INDEPENDENT_AMBULATORY_CARE_PROVIDER_SITE_OTHER): Payer: BC Managed Care – PPO | Admitting: Internal Medicine

## 2019-06-08 ENCOUNTER — Encounter: Payer: Self-pay | Admitting: Internal Medicine

## 2019-06-08 ENCOUNTER — Other Ambulatory Visit (INDEPENDENT_AMBULATORY_CARE_PROVIDER_SITE_OTHER): Payer: BC Managed Care – PPO

## 2019-06-08 ENCOUNTER — Other Ambulatory Visit: Payer: Self-pay

## 2019-06-08 VITALS — BP 124/80 | HR 81 | Temp 98.0°F | Resp 16 | Ht 68.0 in | Wt 218.0 lb

## 2019-06-08 DIAGNOSIS — F410 Panic disorder [episodic paroxysmal anxiety] without agoraphobia: Secondary | ICD-10-CM

## 2019-06-08 DIAGNOSIS — Z79891 Long term (current) use of opiate analgesic: Secondary | ICD-10-CM

## 2019-06-08 DIAGNOSIS — E781 Pure hyperglyceridemia: Secondary | ICD-10-CM

## 2019-06-08 DIAGNOSIS — Z23 Encounter for immunization: Secondary | ICD-10-CM | POA: Diagnosis not present

## 2019-06-08 DIAGNOSIS — F411 Generalized anxiety disorder: Secondary | ICD-10-CM

## 2019-06-08 DIAGNOSIS — R6882 Decreased libido: Secondary | ICD-10-CM | POA: Insufficient documentation

## 2019-06-08 DIAGNOSIS — I10 Essential (primary) hypertension: Secondary | ICD-10-CM | POA: Diagnosis not present

## 2019-06-08 DIAGNOSIS — E039 Hypothyroidism, unspecified: Secondary | ICD-10-CM

## 2019-06-08 LAB — BASIC METABOLIC PANEL
BUN: 13 mg/dL (ref 6–23)
CO2: 23 mEq/L (ref 19–32)
Calcium: 9.2 mg/dL (ref 8.4–10.5)
Chloride: 102 mEq/L (ref 96–112)
Creatinine, Ser: 1.09 mg/dL (ref 0.40–1.50)
GFR: 70.73 mL/min (ref >60.00)
Glucose, Bld: 95 mg/dL (ref 70–99)
Potassium: 4.6 mEq/L (ref 3.5–5.1)
Sodium: 136 mEq/L (ref 135–145)

## 2019-06-08 LAB — TRIGLYCERIDES: Triglycerides: 326 mg/dL — ABNORMAL HIGH (ref 0.0–149.0)

## 2019-06-08 MED ORDER — CLONAZEPAM 1 MG PO TABS: 1.0000 mg | ORAL_TABLET | Freq: Three times a day (TID) | ORAL | 3 refills | Status: DC | PRN

## 2019-06-08 NOTE — Progress Notes (Signed)
Subjective:  Patient ID: Jonathan Luna, male    DOB: [birth year]  Age: 53 y.o. MRN: [id number]  CC: Hypothyroidism, Hyperlipidemia, and Hypertension   HPI OSHEN SERRATORE presents for f/up - He complains of worsening anxiety and wants to increase the Klonopin to 3 times a day.  He also complains of low libido and wants to have his testosterone level checked.  His low back pain is unchanged and he is getting adequate symptom relief with Percocet.  He tells me his blood pressure has been well controlled but he has not been working on his lifestyle modifications and has gained weight.  He denies any recent episodes of CP, DOE, palpitations, edema, or fatigue.  Outpatient Medications Prior to Visit  Medication Sig Dispense Refill  . aspirin 81 MG tablet Take 81 mg by mouth daily.    . Azilsartan Medoxomil (EDARBI) 80 MG TABS Take 1 tablet (80 mg total) by mouth daily. 90 tablet 1  . Cholecalciferol 50 MCG (2000 UT) TABS Take 2 tablets (4,000 Units total) by mouth daily. 180 tablet 1  . DULoxetine (CYMBALTA) 60 MG capsule TAKE ONE CAPSULE BY MOUTH ONE TIME DAILY  90 capsule 1  . mometasone (NASONEX) 50 MCG/ACT nasal spray PLACE TWO SPRAYS INTO THE NOSE DAILY 17 g 11  . omega-3 acid ethyl esters (LOVAZA) 1 g capsule Take 2 capsules (2 g total) by mouth 2 (two) times daily. 360 capsule 0  . oxyCODONE-acetaminophen (PERCOCET) 10-325 MG tablet Take 1 tablet by mouth every 4 (four) hours as needed for pain. 100 tablet 0  . clonazePAM (KLONOPIN) 1 MG tablet TAKE ONE TABLET BY MOUTH TWICE A DAY AS NEEDED FOR ANXIETY  60 tablet 3  . levothyroxine (SYNTHROID) 175 MCG tablet Take 1 tablet (175 mcg total) by mouth daily before breakfast. 90 tablet 0   No facility-administered medications prior to visit.     ROS Review of Systems  Constitutional: Positive for unexpected weight change (wt gain). Negative for diaphoresis and fatigue.  HENT: Negative.  Negative for sore throat.   Eyes: Negative.   Respiratory:  Negative for cough, chest tightness, shortness of breath and wheezing.   Cardiovascular: Negative for chest pain, palpitations and leg swelling.  Gastrointestinal: Negative for abdominal pain, constipation, diarrhea, nausea and vomiting.  Endocrine: Negative for cold intolerance and heat intolerance.  Genitourinary: Negative.  Negative for difficulty urinating and dysuria.  Musculoskeletal: Positive for back pain. Negative for myalgias.  Skin: Negative.   Neurological: Negative for dizziness, weakness, light-headedness and headaches.  Hematological: Negative for adenopathy. Does not bruise/bleed easily.  Psychiatric/Behavioral: Positive for dysphoric mood. Negative for agitation, behavioral problems, confusion, decreased concentration, hallucinations, self-injury, sleep disturbance and suicidal ideas. The patient is nervous/anxious. The patient is not hyperactive.     Objective:  BP 124/80 (BP Location: Left Arm, Patient Position: Sitting, Cuff Size: Large)   Pulse 81   Temp 98 F (36.7 C) (Oral)   Resp 16   Ht 5\' 8"  (1.727 m)   Wt 218 lb (98.9 kg)   SpO2 94%   BMI 33.15 kg/m   BP Readings from Last 3 Encounters:  06/08/19 124/80  03/08/19 (!) 142/92  12/14/18 (!) 142/96    Wt Readings from Last 3 Encounters:  06/08/19 218 lb (98.9 kg)  03/08/19 215 lb (97.5 kg)  12/14/18 213 lb 4 oz (96.7 kg)    Physical Exam Vitals signs reviewed.  Constitutional:      Appearance: He is obese. He is not  ill-appearing or diaphoretic.  HENT:     Nose: Nose normal.     Mouth/Throat:     Mouth: Mucous membranes are moist.  Eyes:     General: No scleral icterus.    Conjunctiva/sclera: Conjunctivae normal.  Neck:     Musculoskeletal: Normal range of motion and neck supple. No neck rigidity or muscular tenderness.  Cardiovascular:     Rate and Rhythm: Normal rate and regular rhythm.     Heart sounds: No murmur.  Pulmonary:     Effort: Pulmonary effort is normal.     Breath sounds: No  stridor. No wheezing, rhonchi or rales.  Abdominal:     General: Abdomen is protuberant. Bowel sounds are normal.     Palpations: There is no hepatomegaly or splenomegaly.     Tenderness: There is no abdominal tenderness.  Musculoskeletal: Normal range of motion.        General: No swelling or tenderness.  Skin:    General: Skin is warm and dry.     Coloration: Skin is not pale.  Neurological:     General: No focal deficit present.     Mental Status: He is alert and oriented to person, place, and time. Mental status is at baseline.  Psychiatric:        Attention and Perception: Attention normal.        Mood and Affect: Mood is anxious. Mood is not depressed or elated. Affect is not flat.        Speech: Speech normal.        Behavior: Behavior normal.        Thought Content: Thought content normal.        Judgment: Judgment normal.     Lab Results  Component Value Date   WBC 10.2 12/14/2018   HGB 14.6 12/14/2018   HCT 43.1 12/14/2018   PLT 345.0 12/14/2018   GLUCOSE 95 06/08/2019   CHOL 239 (H) 03/14/2019   TRIG 326.0 (H) 06/08/2019   HDL 44.10 03/14/2019   LDLDIRECT 159.0 03/14/2019   LDLCALC 106 (H) 04/07/2017   ALT 29 12/14/2018   AST 17 12/14/2018   NA 136 06/08/2019   K 4.6 06/08/2019   CL 102 06/08/2019   CREATININE 1.09 06/08/2019   BUN 13 06/08/2019   CO2 23 06/08/2019   TSH 0.27 (L) 06/08/2019   PSA 1.52 12/14/2018   HGBA1C 5.2 07/28/2012    [name]  Result Date: 02/15/2016 CLINICAL DATA:  53 year old male with chronic symptoms getting worse. Bilateral lower back pain restricting motion. Burning and anterior left high. Initial encounter. EXAM: MRI LUMBAR SPINE WITHOUT CONTRAST TECHNIQUE: Multiplanar, multisequence MR imaging of the lumbar spine was performed. No intravenous contrast was administered. COMPARISON:  No comparison MR. 04/15/2007 CT abdomen and pelvis. FINDINGS: Last fully open disk space is labeled L5-S1. Present examination  incorporates from T11 through upper S3 level. Conus upper L1 level. Remote Schmorl's node deformity L4 superior endplate. Questioned left T11 1 cm nonspecific osseous lesion. It is possible this is related to the rib articulation as versus primary osseous lesion. No other worrisome bony lesions or history of cancer to suggest this is a significant finding. Minimal bulge T10-11 level suspected. T11-12 and T12-L1 unremarkable. L1-2:  Minimal retrolisthesis L1.  Minimal bulge. L2-3: Disc degeneration with mild adjacent endplate reactive changes. Bulge. No significant spinal stenosis or foraminal narrowing. L3-4: Minimal to mild bulge. No significant spinal stenosis or foraminal narrowing. L4-5: Moderate facet degenerative changes. Bulge.  Very mild foraminal narrowing without nerve root compression. No significant spinal stenosis. L5-S1:  Mild facet degenerative changes. IMPRESSION: Degenerative changes throughout the lumbar spine most notable L4-5 level however, no significant spinal stenosis, foraminal narrowing or nerve root compression detected. Please see above for further detail. Electronically Signed   By: Genia Del M.D.   On: 02/15/2016 21:35    Assessment & Plan:   Jonathan Luna was seen today for hypothyroidism, hyperlipidemia and hypertension.  Diagnoses and all orders for this visit:  Need for influenza vaccination -     Flu Vaccine QUAD 36+ mos IM  Essential hypertension- His blood pressure is adequately well controlled. -     Basic metabolic panel; Future  Pure hyperglyceridemia- His triglycerides are elevated.  I have asked him to improve his lifestyle modifications. -     Triglycerides; Future  Acquired hypothyroidism- His TSH is suppressed so I have asked him to lower his dose of levothyroxine. -     TSH; Future  Low libido- His testosterone level is low.  I have asked him to let me know if he wants to start using a testosterone replacement therapy. -     Testosterone Total,Free,Bio,  Males; Future  Long-term current use of opiate analgesic- His urine drug screen is consistent with his medications and is negative for alcohol or substance abuse. -     Pain Mgmt, Profile 8 w/Conf, U; Future  PANIC DISORDER -     clonazePAM (KLONOPIN) 1 MG tablet; Take 1 tablet (1 mg total) by mouth 3 (three) times daily as needed for anxiety.  GAD (generalized anxiety disorder) -     clonazePAM (KLONOPIN) 1 MG tablet; Take 1 tablet (1 mg total) by mouth 3 (three) times daily as needed for anxiety.   I have discontinued Loyal L. Parrales's levothyroxine. I have also changed his clonazePAM. Additionally, I am having him start on levothyroxine. Lastly, I am having him maintain his aspirin, Cholecalciferol, Azilsartan Medoxomil, mometasone, omega-3 acid ethyl esters, DULoxetine, and oxyCODONE-acetaminophen.  Meds ordered this encounter  Medications  . clonazePAM (KLONOPIN) 1 MG tablet    Sig: Take 1 tablet (1 mg total) by mouth 3 (three) times daily as needed for anxiety.    Dispense:  90 tablet    Refill:  3  . levothyroxine (SYNTHROID) 150 MCG tablet    Sig: Take 1 tablet (150 mcg total) by mouth daily.    Dispense:  90 tablet    Refill:  0     Follow-up: Return in about 6 months (around 12/09/2019).  Scarlette Calico, MD

## 2019-06-08 NOTE — Patient Instructions (Signed)
Hypothyroidism  Hypothyroidism is when the thyroid gland does not make enough of certain hormones (it is underactive). The thyroid gland is a small gland located in the lower front part of the neck, just in front of the windpipe (trachea). This gland makes hormones that help control how the body uses food for energy (metabolism) as well as how the heart and brain function. These hormones also play a role in keeping your bones strong. When the thyroid is underactive, it produces too little of the hormones thyroxine (T4) and triiodothyronine (T3). What are the causes? This condition may be caused by:  Hashimoto's disease. This is a disease in which the body's disease-fighting system (immune system) attacks the thyroid gland. This is the most common cause.  Viral infections.  Pregnancy.  Certain medicines.  Birth defects.  Past radiation treatments to the head or neck for cancer.  Past treatment with radioactive iodine.  Past exposure to radiation in the environment.  Past surgical removal of part or all of the thyroid.  Problems with a gland in the center of the brain (pituitary gland).  Lack of enough iodine in the diet. What increases the risk? You are more likely to develop this condition if:  You are male.  You have a family history of thyroid conditions.  You use a medicine called lithium.  You take medicines that affect the immune system (immunosuppressants). What are the signs or symptoms? Symptoms of this condition include:  Feeling as though you have no energy (lethargy).  Not being able to tolerate cold.  Weight gain that is not explained by a change in diet or exercise habits.  Lack of appetite.  Dry skin.  Coarse hair.  Menstrual irregularity.  Slowing of thought processes.  Constipation.  Sadness or depression. How is this diagnosed? This condition may be diagnosed based on:  Your symptoms, your medical history, and a physical exam.  Blood  tests. You may also have imaging tests, such as an ultrasound or MRI. How is this treated? This condition is treated with medicine that replaces the thyroid hormones that your body does not make. After you begin treatment, it may take several weeks for symptoms to go away. Follow these instructions at home:  Take over-the-counter and prescription medicines only as told by your health care provider.  If you start taking any new medicines, tell your health care provider.  Keep all follow-up visits as told by your health care provider. This is important. ? As your condition improves, your dosage of thyroid hormone medicine may change. ? You will need to have blood tests regularly so that your health care provider can monitor your condition. Contact a health care provider if:  Your symptoms do not get better with treatment.  You are taking thyroid replacement medicine and you: ? Sweat a lot. ? Have tremors. ? Feel anxious. ? Lose weight rapidly. ? Cannot tolerate heat. ? Have emotional swings. ? Have diarrhea. ? Feel weak. Get help right away if you have:  Chest pain.  An irregular heartbeat.  A rapid heartbeat.  Difficulty breathing. Summary  Hypothyroidism is when the thyroid gland does not make enough of certain hormones (it is underactive).  When the thyroid is underactive, it produces too little of the hormones thyroxine (T4) and triiodothyronine (T3).  The most common cause is Hashimoto's disease, a disease in which the body's disease-fighting system (immune system) attacks the thyroid gland. The condition can also be caused by viral infections, medicine, pregnancy, or past   radiation treatment to the head or neck.  Symptoms may include weight gain, dry skin, constipation, feeling as though you do not have energy, and not being able to tolerate cold.  This condition is treated with medicine to replace the thyroid hormones that your body does not make. This information  is not intended to replace advice given to you by your health care provider. Make sure you discuss any questions you have with your health care provider. Document Released: 09/29/2005 Document Revised: 09/11/2017 Document Reviewed: 09/09/2017 Elsevier Patient Education  2020 [employer]  

## 2019-06-09 ENCOUNTER — Encounter: Payer: Self-pay | Admitting: Internal Medicine

## 2019-06-09 LAB — TSH: TSH: 0.27 u[IU]/mL — ABNORMAL LOW (ref 0.35–4.50)

## 2019-06-09 MED ORDER — LEVOTHYROXINE SODIUM 150 MCG PO TABS: 150.0000 ug | ORAL_TABLET | Freq: Every day | ORAL | 0 refills | Status: DC

## 2019-06-10 LAB — PAIN MGMT, PROFILE 8 W/CONF, U
6 Acetylmorphine: NEGATIVE ng/mL
Alcohol Metabolites: NEGATIVE ng/mL (ref <500)
Alphahydroxyalprazolam: NEGATIVE ng/mL
Alphahydroxymidazolam: NEGATIVE ng/mL
Alphahydroxytriazolam: NEGATIVE ng/mL
Aminoclonazepam: 367 ng/mL
Amphetamines: NEGATIVE ng/mL
Benzodiazepines: POSITIVE ng/mL
Buprenorphine, Urine: NEGATIVE ng/mL
Cocaine Metabolite: NEGATIVE ng/mL
Codeine: NEGATIVE ng/mL
Creatinine: 80.6 mg/dL
Hydrocodone: NEGATIVE ng/mL
Hydromorphone: NEGATIVE ng/mL
Hydroxyethylflurazepam: NEGATIVE ng/mL
Lorazepam: NEGATIVE ng/mL
MDMA: NEGATIVE ng/mL
Marijuana Metabolite: NEGATIVE ng/mL
Morphine: NEGATIVE ng/mL
Nordiazepam: NEGATIVE ng/mL
Norhydrocodone: NEGATIVE ng/mL
Noroxycodone: 3290 ng/mL
Opiates: NEGATIVE ng/mL
Oxazepam: NEGATIVE ng/mL
Oxidant: NEGATIVE ug/mL
Oxycodone: 1225 ng/mL
Oxycodone: POSITIVE ng/mL
Oxymorphone: 441 ng/mL
Temazepam: NEGATIVE ng/mL
pH: 6.7 (ref 4.5–9.0)

## 2019-06-10 LAB — TESTOSTERONE TOTAL,FREE,BIO, MALES
Albumin: 4.5 g/dL (ref 3.6–5.1)
Sex Hormone Binding: 27 nmol/L (ref 10–50)
Testosterone: 245 ng/dL — ABNORMAL LOW (ref 250–827)

## 2019-06-22 ENCOUNTER — Other Ambulatory Visit: Payer: Self-pay | Admitting: Internal Medicine

## 2019-06-22 DIAGNOSIS — E781 Pure hyperglyceridemia: Secondary | ICD-10-CM

## 2019-07-05 ENCOUNTER — Encounter: Payer: Self-pay | Admitting: Internal Medicine

## 2019-07-08 ENCOUNTER — Other Ambulatory Visit: Payer: Self-pay | Admitting: Internal Medicine

## 2019-07-08 ENCOUNTER — Encounter: Payer: Self-pay | Admitting: Internal Medicine

## 2019-07-08 DIAGNOSIS — M5136 Other intervertebral disc degeneration, lumbar region: Secondary | ICD-10-CM

## 2019-07-08 DIAGNOSIS — M545 Low back pain, unspecified: Secondary | ICD-10-CM

## 2019-07-08 DIAGNOSIS — G8929 Other chronic pain: Secondary | ICD-10-CM

## 2019-07-08 MED ORDER — OXYCODONE-ACETAMINOPHEN 10-325 MG PO TABS: 1.0000 | ORAL_TABLET | ORAL | 0 refills | Status: DC | PRN

## 2019-08-02 ENCOUNTER — Encounter: Payer: Self-pay | Admitting: Internal Medicine

## 2019-08-02 DIAGNOSIS — M5136 Other intervertebral disc degeneration, lumbar region: Secondary | ICD-10-CM

## 2019-08-02 DIAGNOSIS — M545 Low back pain, unspecified: Secondary | ICD-10-CM

## 2019-08-02 DIAGNOSIS — G8929 Other chronic pain: Secondary | ICD-10-CM

## 2019-08-04 MED ORDER — OXYCODONE-ACETAMINOPHEN 10-325 MG PO TABS: 1.0000 | ORAL_TABLET | ORAL | 0 refills | Status: DC | PRN

## 2019-08-04 NOTE — Telephone Encounter (Signed)
Done erx 

## 2019-09-02 ENCOUNTER — Encounter: Payer: Self-pay | Admitting: Internal Medicine

## 2019-09-02 NOTE — Telephone Encounter (Signed)
Key: [id number]

## 2019-09-05 ENCOUNTER — Other Ambulatory Visit: Payer: Self-pay | Admitting: Internal Medicine

## 2019-09-05 DIAGNOSIS — I10 Essential (primary) hypertension: Secondary | ICD-10-CM

## 2019-09-06 ENCOUNTER — Telehealth: Payer: Self-pay

## 2019-09-06 NOTE — Telephone Encounter (Signed)
Key: [id number]

## 2019-09-07 ENCOUNTER — Other Ambulatory Visit: Payer: Self-pay | Admitting: Internal Medicine

## 2019-09-07 ENCOUNTER — Ambulatory Visit: Payer: BC Managed Care – PPO | Admitting: Internal Medicine

## 2019-09-07 DIAGNOSIS — M5136 Other intervertebral disc degeneration, lumbar region: Secondary | ICD-10-CM

## 2019-09-07 DIAGNOSIS — M545 Low back pain, unspecified: Secondary | ICD-10-CM

## 2019-09-07 DIAGNOSIS — G8929 Other chronic pain: Secondary | ICD-10-CM

## 2019-09-07 MED ORDER — OXYCODONE-ACETAMINOPHEN 10-325 MG PO TABS: 1.0000 | ORAL_TABLET | ORAL | 0 refills | Status: DC | PRN

## 2019-09-13 DIAGNOSIS — Z20828 Contact with and (suspected) exposure to other viral communicable diseases: Secondary | ICD-10-CM | POA: Diagnosis not present

## 2019-09-23 ENCOUNTER — Other Ambulatory Visit: Payer: Self-pay | Admitting: Internal Medicine

## 2019-09-23 DIAGNOSIS — E039 Hypothyroidism, unspecified: Secondary | ICD-10-CM

## 2019-10-04 ENCOUNTER — Encounter: Payer: Self-pay | Admitting: Internal Medicine

## 2019-10-04 ENCOUNTER — Other Ambulatory Visit: Payer: Self-pay | Admitting: Internal Medicine

## 2019-10-04 DIAGNOSIS — M545 Low back pain, unspecified: Secondary | ICD-10-CM

## 2019-10-04 DIAGNOSIS — G8929 Other chronic pain: Secondary | ICD-10-CM

## 2019-10-04 DIAGNOSIS — M5136 Other intervertebral disc degeneration, lumbar region: Secondary | ICD-10-CM

## 2019-10-04 MED ORDER — OXYCODONE-ACETAMINOPHEN 10-325 MG PO TABS: 1.0000 | ORAL_TABLET | ORAL | 0 refills | Status: DC | PRN

## 2019-10-04 NOTE — Telephone Encounter (Signed)
Can refill for Oxy be sent in for pick up on 12/24? [facility] is closed on Christmas day.   PA has been submitted.  Key: [id number]

## 2019-10-25 ENCOUNTER — Other Ambulatory Visit: Payer: Self-pay | Admitting: Internal Medicine

## 2019-10-25 DIAGNOSIS — F410 Panic disorder [episodic paroxysmal anxiety] without agoraphobia: Secondary | ICD-10-CM

## 2019-10-25 DIAGNOSIS — F411 Generalized anxiety disorder: Secondary | ICD-10-CM

## 2019-11-03 ENCOUNTER — Encounter: Payer: Self-pay | Admitting: Internal Medicine

## 2019-11-07 ENCOUNTER — Other Ambulatory Visit: Payer: Self-pay | Admitting: Internal Medicine

## 2019-11-07 DIAGNOSIS — G8929 Other chronic pain: Secondary | ICD-10-CM

## 2019-11-07 DIAGNOSIS — M545 Low back pain, unspecified: Secondary | ICD-10-CM

## 2019-11-07 DIAGNOSIS — M5136 Other intervertebral disc degeneration, lumbar region: Secondary | ICD-10-CM

## 2019-11-07 NOTE — Telephone Encounter (Signed)
Need to do PA for rx

## 2019-11-08 MED ORDER — OXYCODONE-ACETAMINOPHEN 10-325 MG PO TABS: 1.0000 | ORAL_TABLET | ORAL | 0 refills | Status: DC | PRN

## 2019-11-08 NOTE — Telephone Encounter (Signed)
PA was approved. 

## 2019-11-08 NOTE — Telephone Encounter (Signed)
Key: [id number]

## 2019-11-21 DIAGNOSIS — Z20828 Contact with and (suspected) exposure to other viral communicable diseases: Secondary | ICD-10-CM | POA: Diagnosis not present

## 2019-11-29 DIAGNOSIS — Z20828 Contact with and (suspected) exposure to other viral communicable diseases: Secondary | ICD-10-CM | POA: Diagnosis not present

## 2019-12-05 ENCOUNTER — Encounter: Payer: Self-pay | Admitting: Internal Medicine

## 2019-12-05 ENCOUNTER — Other Ambulatory Visit: Payer: Self-pay

## 2019-12-05 ENCOUNTER — Ambulatory Visit: Payer: BC Managed Care – PPO | Admitting: Internal Medicine

## 2019-12-05 VITALS — BP 132/84 | HR 83 | Temp 98.4°F | Resp 16 | Ht 68.0 in | Wt 226.0 lb

## 2019-12-05 DIAGNOSIS — M5136 Other intervertebral disc degeneration, lumbar region: Secondary | ICD-10-CM

## 2019-12-05 DIAGNOSIS — Z79891 Long term (current) use of opiate analgesic: Secondary | ICD-10-CM

## 2019-12-05 DIAGNOSIS — E039 Hypothyroidism, unspecified: Secondary | ICD-10-CM | POA: Diagnosis not present

## 2019-12-05 DIAGNOSIS — E785 Hyperlipidemia, unspecified: Secondary | ICD-10-CM

## 2019-12-05 DIAGNOSIS — J301 Allergic rhinitis due to pollen: Secondary | ICD-10-CM

## 2019-12-05 DIAGNOSIS — I10 Essential (primary) hypertension: Secondary | ICD-10-CM

## 2019-12-05 DIAGNOSIS — M545 Low back pain, unspecified: Secondary | ICD-10-CM

## 2019-12-05 DIAGNOSIS — E781 Pure hyperglyceridemia: Secondary | ICD-10-CM | POA: Diagnosis not present

## 2019-12-05 DIAGNOSIS — M51369 Other intervertebral disc degeneration, lumbar region without mention of lumbar back pain or lower extremity pain: Secondary | ICD-10-CM

## 2019-12-05 DIAGNOSIS — H6982 Other specified disorders of Eustachian tube, left ear: Secondary | ICD-10-CM | POA: Insufficient documentation

## 2019-12-05 DIAGNOSIS — E559 Vitamin D deficiency, unspecified: Secondary | ICD-10-CM

## 2019-12-05 DIAGNOSIS — G8929 Other chronic pain: Secondary | ICD-10-CM

## 2019-12-05 DIAGNOSIS — H6992 Unspecified Eustachian tube disorder, left ear: Secondary | ICD-10-CM

## 2019-12-05 DIAGNOSIS — J0101 Acute recurrent maxillary sinusitis: Secondary | ICD-10-CM

## 2019-12-05 LAB — CBC WITH DIFFERENTIAL/PLATELET
Basophils Absolute: 0.1 10*3/uL (ref 0.0–0.1)
Basophils Relative: 0.8 % (ref 0.0–3.0)
Eosinophils Absolute: 0.2 10*3/uL (ref 0.0–0.7)
Eosinophils Relative: 1.8 % (ref 0.0–5.0)
HCT: 41.7 % (ref 39.0–52.0)
Hemoglobin: 14.5 g/dL (ref 13.0–17.0)
Lymphocytes Relative: 36.2 % (ref 12.0–46.0)
Lymphs Abs: 3.8 10*3/uL (ref 0.7–4.0)
MCHC: 34.6 g/dL (ref 30.0–36.0)
MCV: 92.9 fl (ref 78.0–100.0)
Monocytes Absolute: 0.8 10*3/uL (ref 0.1–1.0)
Monocytes Relative: 7.5 % (ref 3.0–12.0)
Neutro Abs: 5.6 10*3/uL (ref 1.4–7.7)
Neutrophils Relative %: 53.7 % (ref 43.0–77.0)
Platelets: 346 10*3/uL (ref 150.0–400.0)
RBC: 4.49 Mil/uL (ref 4.22–5.81)
RDW: 12.5 % (ref 11.5–15.5)
WBC: 10.4 10*3/uL (ref 4.0–10.5)

## 2019-12-05 LAB — TSH: TSH: 30.58 u[IU]/mL — ABNORMAL HIGH (ref 0.35–4.50)

## 2019-12-05 LAB — VITAMIN D 25 HYDROXY (VIT D DEFICIENCY, FRACTURES): VITD: 25.63 ng/mL — ABNORMAL LOW (ref 30.00–100.00)

## 2019-12-05 MED ORDER — METHYLPREDNISOLONE 4 MG PO TBPK: ORAL_TABLET | ORAL | 0 refills | Status: AC

## 2019-12-05 MED ORDER — OXYCODONE-ACETAMINOPHEN 10-325 MG PO TABS: 1.0000 | ORAL_TABLET | ORAL | 0 refills | Status: DC | PRN

## 2019-12-05 MED ORDER — AMOXICILLIN-POT CLAVULANATE 875-125 MG PO TABS: 1.0000 | ORAL_TABLET | Freq: Two times a day (BID) | ORAL | 0 refills | Status: AC

## 2019-12-05 MED ORDER — LEVOCETIRIZINE DIHYDROCHLORIDE 5 MG PO TABS: 5.0000 mg | ORAL_TABLET | Freq: Every evening | ORAL | 1 refills | Status: DC

## 2019-12-05 MED ORDER — FLUTICASONE PROPIONATE 50 MCG/ACT NA SUSP: 2.0000 | Freq: Every day | NASAL | 1 refills | Status: DC

## 2019-12-05 NOTE — Progress Notes (Signed)
Subjective:  Patient ID: Jonathan Luna, male    DOB: Mar 15, 1966  Age: 54 y.o. MRN: [id number]  CC: Allergic Rhinitis , Sinusitis, Hypothyroidism, and Back Pain   This visit occurred during the SARS-CoV-2 public health emergency.  Safety protocols were in place, including screening questions prior to the visit, additional usage of staff PPE, and extensive cleaning of exam room while observing appropriate contact time as indicated for disinfecting solutions.    HPI Jonathan Luna presents for f/up - He complains of a 10-day history of nasal congestion, runny nose, popping and pressure in both ears, thick bloody nasal phlegm, and facial pain.  He wants to see an ENT doctor.  He is not using Nasacort because the insurance company would not pay for it.  He complains of weight gain but tells me he is compliant with his current thyroid supplement.  He continues to complain of back pain and wants to continue taking oxycodone and acetaminophen.  He continues to complain of episodes of anxiety and panic and wants to stay on the current dose of clonazepam.  Outpatient Medications Prior to Visit  Medication Sig Dispense Refill  . aspirin 81 MG tablet Take 81 mg by mouth daily.    . clonazePAM (KLONOPIN) 1 MG tablet TAKE ONE TABLET BY MOUTH THREE TIMES A DAY AS NEEDED FOR ANXIETY  90 tablet 2  . DULoxetine (CYMBALTA) 60 MG capsule TAKE ONE CAPSULE BY MOUTH ONE TIME DAILY  90 capsule 1  . EDARBI 80 MG TABS TAKE ONE TABLET BY MOUTH ONE TIME DAILY  90 tablet 1  . Cholecalciferol 50 MCG (2000 UT) TABS Take 2 tablets (4,000 Units total) by mouth daily. 180 tablet 1  . levothyroxine (SYNTHROID) 150 MCG tablet TAKE ONE TABLET BY MOUTH ONE TIME DAILY  90 tablet 0  . omega-3 acid ethyl esters (LOVAZA) 1 g capsule TAKE TWO CAPSULES BY MOUTH TWICE DAILY  360 capsule 1  . oxyCODONE-acetaminophen (PERCOCET) 10-325 MG tablet Take 1 tablet by mouth every 4 (four) hours as needed for pain. 100 tablet 0  . mometasone (NASONEX)  50 MCG/ACT nasal spray PLACE TWO SPRAYS INTO THE NOSE DAILY (Patient not taking: Reported on 12/05/2019) 17 g 11   No facility-administered medications prior to visit.    ROS Review of Systems  Constitutional: Positive for unexpected weight change (wt gain). Negative for chills, diaphoresis, fatigue and fever.  HENT: Positive for congestion, ear pain, nosebleeds, postnasal drip, rhinorrhea, sinus pressure and sinus pain. Negative for facial swelling, hearing loss and sore throat.   Eyes: Negative.   Respiratory: Negative for cough, chest tightness, shortness of breath and wheezing.   Cardiovascular: Negative for chest pain, palpitations and leg swelling.  Gastrointestinal: Negative for abdominal pain, constipation, nausea and vomiting.  Endocrine: Negative.  Negative for cold intolerance and heat intolerance.  Genitourinary: Negative for difficulty urinating and dysuria.  Musculoskeletal: Positive for back pain. Negative for arthralgias, joint swelling, myalgias and neck pain.  Skin: Negative.  Negative for color change.  Neurological: Negative.  Negative for dizziness, weakness, light-headedness, numbness and headaches.  Hematological: Negative for adenopathy. Does not bruise/bleed easily.  Psychiatric/Behavioral: Negative for behavioral problems, confusion, dysphoric mood, sleep disturbance and suicidal ideas. The patient is nervous/anxious.     Objective:  BP 132/84 (BP Location: Left Arm, Patient Position: Sitting, Cuff Size: Normal)   Pulse 83   Temp 98.4 F (36.9 C) (Oral)   Resp 16   Ht 5\' 8"  (1.727 m)   Wt  226 lb (102.5 kg)   SpO2 96%   BMI 34.36 kg/m   BP Readings from Last 3 Encounters:  12/05/19 132/84  06/08/19 124/80  03/08/19 (!) 142/92    Wt Readings from Last 3 Encounters:  12/05/19 226 lb (102.5 kg)  06/08/19 218 lb (98.9 kg)  03/08/19 215 lb (97.5 kg)    Physical Exam Vitals reviewed.  Constitutional:      Appearance: Normal appearance.  HENT:      Right Ear: Hearing, tympanic membrane, ear canal and external ear normal.     Left Ear: Hearing, tympanic membrane, ear canal and external ear normal.     Nose: Mucosal edema, congestion and rhinorrhea present. Rhinorrhea is clear.     Left Nostril: No foreign body, epistaxis or occlusion.     Right Turbinates: Not enlarged, swollen or pale.     Left Turbinates: Not enlarged, swollen or pale.     Right Sinus: Maxillary sinus tenderness present. No frontal sinus tenderness.     Left Sinus: Maxillary sinus tenderness present. No frontal sinus tenderness.     Mouth/Throat:     Mouth: Mucous membranes are moist.  Eyes:     General: No scleral icterus.    Conjunctiva/sclera: Conjunctivae normal.  Cardiovascular:     Rate and Rhythm: Normal rate and regular rhythm.     Heart sounds: No murmur.  Pulmonary:     Effort: Pulmonary effort is normal.     Breath sounds: No stridor. No wheezing, rhonchi or rales.  Abdominal:     General: Abdomen is protuberant. Bowel sounds are normal. There is no distension.     Palpations: Abdomen is soft. There is no hepatomegaly, splenomegaly or mass.     Tenderness: There is no abdominal tenderness.  Musculoskeletal:        General: Normal range of motion.     Cervical back: Neck supple.     Right lower leg: No edema.     Left lower leg: No edema.  Lymphadenopathy:     Cervical: No cervical adenopathy.  Skin:    General: Skin is warm and dry.     Coloration: Skin is not pale.  Neurological:     General: No focal deficit present.     Mental Status: He is alert.  Psychiatric:        Mood and Affect: Mood normal.     Lab Results  Component Value Date   WBC 10.4 12/05/2019   HGB 14.5 12/05/2019   HCT 41.7 12/05/2019   PLT 346.0 12/05/2019   GLUCOSE 91 12/05/2019   CHOL 288 (H) 12/05/2019   TRIG (H) 12/05/2019    608.0 Triglyceride is over 400; calculations on Lipids are invalid.   HDL 40.00 12/05/2019   LDLDIRECT 179.0 12/05/2019   LDLCALC 106  (H) 04/07/2017   ALT 37 12/05/2019   AST 28 12/05/2019   NA 133 (L) 12/05/2019   K 4.3 12/05/2019   CL 98 12/05/2019   CREATININE 0.98 12/05/2019   BUN 15 12/05/2019   CO2 25 12/05/2019   TSH 30.58 (H) 12/05/2019   PSA 1.52 12/14/2018   HGBA1C 5.2 07/28/2012    [name]  Result Date: 02/15/2016 CLINICAL DATA:  54 year old male with chronic symptoms getting worse. Bilateral lower back pain restricting motion. Burning and anterior left high. Initial encounter. EXAM: MRI LUMBAR SPINE WITHOUT CONTRAST TECHNIQUE: Multiplanar, multisequence MR imaging of the lumbar spine was performed. No intravenous contrast was administered. COMPARISON:  No  comparison MR. 04/15/2007 CT abdomen and pelvis. FINDINGS: Last fully open disk space is labeled L5-S1. Present examination incorporates from T11 through upper S3 level. Conus upper L1 level. Remote Schmorl's node deformity L4 superior endplate. Questioned left T11 1 cm nonspecific osseous lesion. It is possible this is related to the rib articulation as versus primary osseous lesion. No other worrisome bony lesions or history of cancer to suggest this is a significant finding. Minimal bulge T10-11 level suspected. T11-12 and T12-L1 unremarkable. L1-2:  Minimal retrolisthesis L1.  Minimal bulge. L2-3: Disc degeneration with mild adjacent endplate reactive changes. Bulge. No significant spinal stenosis or foraminal narrowing. L3-4: Minimal to mild bulge. No significant spinal stenosis or foraminal narrowing. L4-5: Moderate facet degenerative changes. Bulge. Very mild foraminal narrowing without nerve root compression. No significant spinal stenosis. L5-S1:  Mild facet degenerative changes. IMPRESSION: Degenerative changes throughout the lumbar spine most notable L4-5 level however, no significant spinal stenosis, foraminal narrowing or nerve root compression detected. Please see above for further detail. Electronically Signed   By: Genia Del M.D.    On: 02/15/2016 21:35    Assessment & Plan:   Jonathan Luna was seen today for allergic rhinitis , sinusitis, hypothyroidism and back pain.  Diagnoses and all orders for this visit:  Essential hypertension- His blood pressure is adequately well controlled.  Electrolytes and renal function are ok. -     CBC with Differential/Platelet -     Basic metabolic panel  Acquired hypothyroidism- His TSH is elevated just above 30.  I recommended that he start using brand-name Synthroid and to increase the dose to 200 mcg a day. -     TSH -     levothyroxine (SYNTHROID) 200 MCG tablet; Take 1 tablet (200 mcg total) by mouth daily before breakfast.  Encounter for long-term opiate analgesic use- The oxycodone level in his urine is a bit high.  I have asked him to decrease his oxycodone dose to 2 or 3 times a day. -     Pain Mgmt, Profile 8 w/Conf, U  Hyperlipidemia with target LDL less than 130- He does not have an elevated ASCVD risk score so I did not recommend a statin for CV risk reduction. -     Lipid panel -     Hepatic function panel  Pure hyperglyceridemia- His triglycerides are elevated at 608.  I recommended that he be compliant with the fish oil supplement and to improve his lifestyle modifications. -     Lipid panel  Vitamin D deficiency disease -     VITAMIN D 25 Hydroxy (Vit-D Deficiency, Fractures) -     Cholecalciferol 50 MCG (2000 UT) TABS; Take 2 tablets (4,000 Units total) by mouth daily.  Non-seasonal allergic rhinitis due to pollen -     fluticasone (FLONASE) 50 MCG/ACT nasal spray; Place 2 sprays into both nostrils daily. -     levocetirizine (XYZAL) 5 MG tablet; Take 1 tablet (5 mg total) by mouth every evening. -     methylPREDNISolone (MEDROL DOSEPAK) 4 MG TBPK tablet; TAKE AS DIRECTED -     Ambulatory referral to ENT  DDD (degenerative disc disease), lumbar -     Discontinue: oxyCODONE-acetaminophen (PERCOCET) 10-325 MG tablet; Take 1 tablet by mouth every 4 (four) hours as  needed for pain. -     oxyCODONE-acetaminophen (PERCOCET) 10-325 MG tablet; Take 1 tablet by mouth every 8 (eight) hours as needed for pain.  Eustachian tube dysfunction, left -     methylPREDNISolone (  MEDROL DOSEPAK) 4 MG TBPK tablet; TAKE AS DIRECTED -     Ambulatory referral to ENT  Chronic low back pain without sciatica, unspecified back pain laterality -     Discontinue: oxyCODONE-acetaminophen (PERCOCET) 10-325 MG tablet; Take 1 tablet by mouth every 4 (four) hours as needed for pain. -     oxyCODONE-acetaminophen (PERCOCET) 10-325 MG tablet; Take 1 tablet by mouth every 8 (eight) hours as needed for pain.  Acute recurrent maxillary sinusitis -     amoxicillin-clavulanate (AUGMENTIN) 875-125 MG tablet; Take 1 tablet by mouth 2 (two) times daily for 10 days. -     Ambulatory referral to ENT  Other orders -     LDL cholesterol, direct   I have discontinued Tylin L. Reising's mometasone, levothyroxine, and oxyCODONE-acetaminophen. I have also changed his oxyCODONE-acetaminophen. Additionally, I am having him start on fluticasone, levocetirizine, methylPREDNISolone, amoxicillin-clavulanate, and levothyroxine. Lastly, I am having him maintain his aspirin, DULoxetine, Edarbi, clonazePAM, and Cholecalciferol.  Meds ordered this encounter  Medications  . fluticasone (FLONASE) 50 MCG/ACT nasal spray    Sig: Place 2 sprays into both nostrils daily.    Dispense:  48 g    Refill:  1  . levocetirizine (XYZAL) 5 MG tablet    Sig: Take 1 tablet (5 mg total) by mouth every evening.    Dispense:  90 tablet    Refill:  1  . methylPREDNISolone (MEDROL DOSEPAK) 4 MG TBPK tablet    Sig: TAKE AS DIRECTED    Dispense:  21 tablet    Refill:  0  . DISCONTD: oxyCODONE-acetaminophen (PERCOCET) 10-325 MG tablet    Sig: Take 1 tablet by mouth every 4 (four) hours as needed for pain.    Dispense:  100 tablet    Refill:  0  . amoxicillin-clavulanate (AUGMENTIN) 875-125 MG tablet    Sig: Take 1 tablet by  mouth 2 (two) times daily for 10 days.    Dispense:  20 tablet    Refill:  0  . Cholecalciferol 50 MCG (2000 UT) TABS    Sig: Take 2 tablets (4,000 Units total) by mouth daily.    Dispense:  180 tablet    Refill:  1  . levothyroxine (SYNTHROID) 200 MCG tablet    Sig: Take 1 tablet (200 mcg total) by mouth daily before breakfast.    Dispense:  90 tablet    Refill:  0  . oxyCODONE-acetaminophen (PERCOCET) 10-325 MG tablet    Sig: Take 1 tablet by mouth every 8 (eight) hours as needed for pain.    Dispense:  75 tablet    Refill:  0     Follow-up: Return in about 4 months (around 04/03/2020).  Scarlette Calico, MD

## 2019-12-05 NOTE — Patient Instructions (Signed)
Hypothyroidism  Hypothyroidism is when the thyroid gland does not make enough of certain hormones (it is underactive). The thyroid gland is a small gland located in the lower front part of the neck, just in front of the windpipe (trachea). This gland makes hormones that help control how the body uses food for energy (metabolism) as well as how the heart and brain function. These hormones also play a role in keeping your bones strong. When the thyroid is underactive, it produces too little of the hormones thyroxine (T4) and triiodothyronine (T3). What are the causes? This condition may be caused by:  Hashimoto's disease. This is a disease in which the body's disease-fighting system (immune system) attacks the thyroid gland. This is the most common cause.  Viral infections.  Pregnancy.  Certain medicines.  Birth defects.  Past radiation treatments to the head or neck for cancer.  Past treatment with radioactive iodine.  Past exposure to radiation in the environment.  Past surgical removal of part or all of the thyroid.  Problems with a gland in the center of the brain (pituitary gland).  Lack of enough iodine in the diet. What increases the risk? You are more likely to develop this condition if:  You are male.  You have a family history of thyroid conditions.  You use a medicine called lithium.  You take medicines that affect the immune system (immunosuppressants). What are the signs or symptoms? Symptoms of this condition include:  Feeling as though you have no energy (lethargy).  Not being able to tolerate cold.  Weight gain that is not explained by a change in diet or exercise habits.  Lack of appetite.  Dry skin.  Coarse hair.  Menstrual irregularity.  Slowing of thought processes.  Constipation.  Sadness or depression. How is this diagnosed? This condition may be diagnosed based on:  Your symptoms, your medical history, and a physical exam.  Blood  tests. You may also have imaging tests, such as an ultrasound or MRI. How is this treated? This condition is treated with medicine that replaces the thyroid hormones that your body does not make. After you begin treatment, it may take several weeks for symptoms to go away. Follow these instructions at home:  Take over-the-counter and prescription medicines only as told by your health care provider.  If you start taking any new medicines, tell your health care provider.  Keep all follow-up visits as told by your health care provider. This is important. ? As your condition improves, your dosage of thyroid hormone medicine may change. ? You will need to have blood tests regularly so that your health care provider can monitor your condition. Contact a health care provider if:  Your symptoms do not get better with treatment.  You are taking thyroid replacement medicine and you: ? Sweat a lot. ? Have tremors. ? Feel anxious. ? Lose weight rapidly. ? Cannot tolerate heat. ? Have emotional swings. ? Have diarrhea. ? Feel weak. Get help right away if you have:  Chest pain.  An irregular heartbeat.  A rapid heartbeat.  Difficulty breathing. Summary  Hypothyroidism is when the thyroid gland does not make enough of certain hormones (it is underactive).  When the thyroid is underactive, it produces too little of the hormones thyroxine (T4) and triiodothyronine (T3).  The most common cause is Hashimoto's disease, a disease in which the body's disease-fighting system (immune system) attacks the thyroid gland. The condition can also be caused by viral infections, medicine, pregnancy, or past   radiation treatment to the head or neck.  Symptoms may include weight gain, dry skin, constipation, feeling as though you do not have energy, and not being able to tolerate cold.  This condition is treated with medicine to replace the thyroid hormones that your body does not make. This information  is not intended to replace advice given to you by your health care provider. Make sure you discuss any questions you have with your health care provider. Document Revised: 09/11/2017 Document Reviewed: 09/09/2017 Elsevier Patient Education  2020 [employer]  

## 2019-12-06 LAB — BASIC METABOLIC PANEL
BUN: 15 mg/dL (ref 6–23)
CO2: 25 mEq/L (ref 19–32)
Calcium: 9.4 mg/dL (ref 8.4–10.5)
Chloride: 98 mEq/L (ref 96–112)
Creatinine, Ser: 0.98 mg/dL (ref 0.40–1.50)
GFR: 79.82 mL/min (ref >60.00)
Glucose, Bld: 91 mg/dL (ref 70–99)
Potassium: 4.3 mEq/L (ref 3.5–5.1)
Sodium: 133 mEq/L — ABNORMAL LOW (ref 135–145)

## 2019-12-06 LAB — HEPATIC FUNCTION PANEL
ALT: 37 U/L (ref 0–53)
AST: 28 U/L (ref 0–37)
Albumin: 4.8 g/dL (ref 3.5–5.2)
Alkaline Phosphatase: 51 U/L (ref 39–117)
Bilirubin, Direct: 0 mg/dL (ref 0.0–0.3)
Total Bilirubin: 0.4 mg/dL (ref 0.2–1.2)
Total Protein: 7.5 g/dL (ref 6.0–8.3)

## 2019-12-06 LAB — LIPID PANEL
Cholesterol: 288 mg/dL — ABNORMAL HIGH (ref 0–200)
HDL: 40 mg/dL (ref >39.00)
Total CHOL/HDL Ratio: 7
Triglycerides: 608 mg/dL — ABNORMAL HIGH (ref 0.0–149.0)

## 2019-12-06 LAB — LDL CHOLESTEROL, DIRECT: Direct LDL: 179 mg/dL

## 2019-12-07 ENCOUNTER — Other Ambulatory Visit: Payer: Self-pay | Admitting: Internal Medicine

## 2019-12-07 DIAGNOSIS — E781 Pure hyperglyceridemia: Secondary | ICD-10-CM

## 2019-12-07 LAB — PAIN MGMT, PROFILE 8 W/CONF, U
6 Acetylmorphine: NEGATIVE ng/mL
Alcohol Metabolites: NEGATIVE ng/mL (ref <500)
Alphahydroxyalprazolam: NEGATIVE ng/mL
Alphahydroxymidazolam: NEGATIVE ng/mL
Alphahydroxytriazolam: NEGATIVE ng/mL
Aminoclonazepam: 469 ng/mL
Amphetamines: NEGATIVE ng/mL
Benzodiazepines: POSITIVE ng/mL
Buprenorphine, Urine: NEGATIVE ng/mL
Cocaine Metabolite: NEGATIVE ng/mL
Codeine: NEGATIVE ng/mL
Creatinine: 146.6 mg/dL
Hydrocodone: NEGATIVE ng/mL
Hydromorphone: NEGATIVE ng/mL
Hydroxyethylflurazepam: NEGATIVE ng/mL
Lorazepam: NEGATIVE ng/mL
MDMA: NEGATIVE ng/mL
Marijuana Metabolite: NEGATIVE ng/mL
Morphine: NEGATIVE ng/mL
Nordiazepam: NEGATIVE ng/mL
Norhydrocodone: NEGATIVE ng/mL
Noroxycodone: 6480 ng/mL
Opiates: NEGATIVE ng/mL
Oxazepam: NEGATIVE ng/mL
Oxidant: NEGATIVE ug/mL
Oxycodone: 2815 ng/mL
Oxycodone: POSITIVE ng/mL
Oxymorphone: 819 ng/mL
Temazepam: NEGATIVE ng/mL
pH: 6.7 (ref 4.5–9.0)

## 2019-12-08 ENCOUNTER — Encounter: Payer: Self-pay | Admitting: Internal Medicine

## 2019-12-08 NOTE — Telephone Encounter (Signed)
Pt is requesting to titrate off of cymbalta.   Pt is aware PCP is out of office and agreed to wait. 30 day erx sent for cymbalta 60 mg.   Please advise patient on how to titrate off of cymbalta.

## 2019-12-10 ENCOUNTER — Encounter: Payer: Self-pay | Admitting: Internal Medicine

## 2019-12-10 MED ORDER — CHOLECALCIFEROL 50 MCG (2000 UT) PO TABS: 2.0000 | ORAL_TABLET | Freq: Every day | ORAL | 1 refills | Status: DC

## 2019-12-10 MED ORDER — OXYCODONE-ACETAMINOPHEN 10-325 MG PO TABS: 1.0000 | ORAL_TABLET | Freq: Three times a day (TID) | ORAL | 0 refills | Status: DC | PRN

## 2019-12-10 MED ORDER — LEVOTHYROXINE SODIUM 200 MCG PO TABS: 200.0000 ug | ORAL_TABLET | Freq: Every day | ORAL | 0 refills | Status: DC

## 2019-12-11 ENCOUNTER — Other Ambulatory Visit: Payer: Self-pay | Admitting: Internal Medicine

## 2019-12-15 ENCOUNTER — Encounter: Payer: Self-pay | Admitting: Internal Medicine

## 2019-12-20 DIAGNOSIS — Z20828 Contact with and (suspected) exposure to other viral communicable diseases: Secondary | ICD-10-CM | POA: Diagnosis not present

## 2019-12-23 DIAGNOSIS — Z20828 Contact with and (suspected) exposure to other viral communicable diseases: Secondary | ICD-10-CM | POA: Diagnosis not present

## 2019-12-30 ENCOUNTER — Telehealth: Payer: Self-pay

## 2019-12-30 NOTE — Telephone Encounter (Signed)
Key: [id number]

## 2020-01-02 ENCOUNTER — Ambulatory Visit: Payer: BC Managed Care – PPO | Attending: Internal Medicine

## 2020-01-02 ENCOUNTER — Encounter: Payer: Self-pay | Admitting: Internal Medicine

## 2020-01-02 DIAGNOSIS — Z23 Encounter for immunization: Secondary | ICD-10-CM

## 2020-01-02 NOTE — Progress Notes (Signed)
   Cov[facility] Vaccination [facility]  Name:  Jonathan Luna    MRN: [id number] DOB: 1966-07-24  01/02/2020  Jonathan Luna was observed post Covid-19 immunization for 15 minutes without incident. He was provided with Vaccine Information Sheet and instruction to access the V-Safe system.   Jonathan Luna was instructed to call 911 with any severe reactions post vaccine: Marland Kitchen Difficulty breathing  . Swelling of face and throat  . A fast heartbeat  . A bad rash all over body  . Dizziness and weakness   Immunizations Administered    Name Date Dose VIS Date Route   Pfizer COVID-19 Vaccine 01/02/2020 11:05 AM 0.3 mL 09/23/2019 Intramuscular   Manufacturer: Rosalie   Lot: [id number]   [place]: [id number]

## 2020-01-05 ENCOUNTER — Other Ambulatory Visit: Payer: Self-pay

## 2020-01-05 ENCOUNTER — Ambulatory Visit: Payer: BC Managed Care – PPO | Admitting: Internal Medicine

## 2020-01-05 ENCOUNTER — Encounter: Payer: Self-pay | Admitting: Internal Medicine

## 2020-01-05 VITALS — BP 130/76 | HR 101 | Temp 98.4°F | Ht 68.0 in | Wt 225.0 lb

## 2020-01-05 DIAGNOSIS — E781 Pure hyperglyceridemia: Secondary | ICD-10-CM

## 2020-01-05 DIAGNOSIS — E039 Hypothyroidism, unspecified: Secondary | ICD-10-CM | POA: Diagnosis not present

## 2020-01-05 DIAGNOSIS — I10 Essential (primary) hypertension: Secondary | ICD-10-CM | POA: Diagnosis not present

## 2020-01-05 DIAGNOSIS — E785 Hyperlipidemia, unspecified: Secondary | ICD-10-CM

## 2020-01-05 MED ORDER — DULOXETINE HCL 30 MG PO CPEP: 30.0000 mg | ORAL_CAPSULE | Freq: Every day | ORAL | 3 refills | Status: DC

## 2020-01-05 MED ORDER — IRBESARTAN 150 MG PO TABS: 150.0000 mg | ORAL_TABLET | Freq: Every day | ORAL | 1 refills | Status: DC

## 2020-01-05 NOTE — Progress Notes (Signed)
Subjective:  Patient ID: Jonathan Luna, male    DOB: 11-19-1965  Age: 54 y.o. MRN: [id number]  CC: Hypothyroidism and Hyperlipidemia  This visit occurred during the SARS-CoV-2 public health emergency.  Safety protocols were in place, including screening questions prior to the visit, additional usage of staff PPE, and extensive cleaning of exam room while observing appropriate contact time as indicated for disinfecting solutions.    HPI KIVEN SHERWIN presents for f/up - He was seen a month ago and was found to have an elevated TSH at about 30.  I have asked him to increase his levothyroxine dose to 200 mcg a day but instead he took 2 of the 150 mcgs.  He has been working on his lifestyle modifications to lower his triglycerides.  He has been walking and consuming fewer calories.  He denies any recent episodes of abdominal pain, loss of appetite, fatigue, edema, chest pain, shortness of breath, or palpitations.  He wants to lower the dose of duloxetine.  He wants to change Edarbi to something else because it is too expensive.  Outpatient Medications Prior to Visit  Medication Sig Dispense Refill  . aspirin 81 MG tablet Take 81 mg by mouth daily.    . Cholecalciferol 50 MCG (2000 UT) TABS Take 2 tablets (4,000 Units total) by mouth daily. 180 tablet 1  . clonazePAM (KLONOPIN) 1 MG tablet TAKE ONE TABLET BY MOUTH THREE TIMES A DAY AS NEEDED FOR ANXIETY  90 tablet 2  . fluticasone (FLONASE) 50 MCG/ACT nasal spray Place 2 sprays into both nostrils daily. 48 g 1  . levocetirizine (XYZAL) 5 MG tablet Take 1 tablet (5 mg total) by mouth every evening. 90 tablet 1  . levothyroxine (SYNTHROID) 200 MCG tablet Take 1 tablet (200 mcg total) by mouth daily before breakfast. 90 tablet 0  . omega-3 acid ethyl esters (LOVAZA) 1 g capsule TAKE TWO CAPSULES BY MOUTH TWICE DAILY  360 capsule 1  . oxyCODONE-acetaminophen (PERCOCET) 10-325 MG tablet Take 1 tablet by mouth every 8 (eight) hours as needed for pain. 75 tablet  0  . DULoxetine (CYMBALTA) 60 MG capsule TAKE ONE CAPSULE BY MOUTH ONE TIME DAILY  90 capsule 1  . EDARBI 80 MG TABS TAKE ONE TABLET BY MOUTH ONE TIME DAILY  90 tablet 1   No facility-administered medications prior to visit.    ROS Review of Systems  Constitutional: Negative for appetite change, diaphoresis, fatigue and unexpected weight change.  HENT: Negative.   Eyes: Negative.   Respiratory: Negative for cough, chest tightness, shortness of breath and wheezing.   Cardiovascular: Negative for chest pain, palpitations and leg swelling.  Gastrointestinal: Negative for abdominal pain, constipation, nausea and vomiting.  Endocrine: Negative for cold intolerance and heat intolerance.  Genitourinary: Negative.  Negative for difficulty urinating.  Musculoskeletal: Negative.  Negative for arthralgias and myalgias.  Skin: Negative.  Negative for color change and pallor.  Neurological: Negative.  Negative for dizziness and light-headedness.  Hematological: Negative for adenopathy. Does not bruise/bleed easily.  Psychiatric/Behavioral: Negative.     Objective:  BP 130/76 (BP Location: Left Arm, Patient Position: Sitting, Cuff Size: Large)   Pulse (!) 101   Temp 98.4 F (36.9 C) (Oral)   Ht 5\' 8"  (1.727 m)   Wt 225 lb (102.1 kg)   SpO2 98%   BMI 34.21 kg/m   BP Readings from Last 3 Encounters:  01/05/20 130/76  12/05/19 132/84  06/08/19 124/80    Wt Readings from Last  3 Encounters:  01/05/20 225 lb (102.1 kg)  12/05/19 226 lb (102.5 kg)  06/08/19 218 lb (98.9 kg)    Physical Exam Vitals reviewed.  Constitutional:      Appearance: He is obese.  HENT:     Nose: Nose normal.     Mouth/Throat:     Mouth: Mucous membranes are moist.  Eyes:     General: No scleral icterus.    Conjunctiva/sclera: Conjunctivae normal.  Cardiovascular:     Rate and Rhythm: Normal rate and regular rhythm.     Heart sounds: No murmur.  Pulmonary:     Effort: Pulmonary effort is normal.      Breath sounds: No stridor. No wheezing, rhonchi or rales.  Abdominal:     General: Abdomen is protuberant. Bowel sounds are normal. There is no distension.     Palpations: Abdomen is soft. There is no hepatomegaly, splenomegaly or mass.     Tenderness: There is no abdominal tenderness.  Musculoskeletal:        General: Normal range of motion.     Cervical back: Neck supple.     Right lower leg: No edema.     Left lower leg: No edema.  Lymphadenopathy:     Cervical: No cervical adenopathy.  Skin:    General: Skin is warm and dry.  Neurological:     General: No focal deficit present.     Mental Status: He is alert.  Psychiatric:        Mood and Affect: Mood normal.        Behavior: Behavior normal.     Lab Results  Component Value Date   WBC 10.4 12/05/2019   HGB 14.5 12/05/2019   HCT 41.7 12/05/2019   PLT 346.0 12/05/2019   GLUCOSE 91 12/05/2019   CHOL 210 (H) 01/05/2020   TRIG 329.0 (H) 01/05/2020   HDL 35.30 (L) 01/05/2020   LDLDIRECT 135.0 01/05/2020   LDLCALC 106 (H) 04/07/2017   ALT 37 12/05/2019   AST 28 12/05/2019   NA 133 (L) 12/05/2019   K 4.3 12/05/2019   CL 98 12/05/2019   CREATININE 0.98 12/05/2019   BUN 15 12/05/2019   CO2 25 12/05/2019   TSH 0.02 (L) 01/05/2020   PSA 1.52 12/14/2018   HGBA1C 5.2 07/28/2012    [name]  Result Date: 02/15/2016 CLINICAL DATA:  54 year old male with chronic symptoms getting worse. Bilateral lower back pain restricting motion. Burning and anterior left high. Initial encounter. EXAM: MRI LUMBAR SPINE WITHOUT CONTRAST TECHNIQUE: Multiplanar, multisequence MR imaging of the lumbar spine was performed. No intravenous contrast was administered. COMPARISON:  No comparison MR. 04/15/2007 CT abdomen and pelvis. FINDINGS: Last fully open disk space is labeled L5-S1. Present examination incorporates from T11 through upper S3 level. Conus upper L1 level. Remote Schmorl's node deformity L4 superior endplate. Questioned  left T11 1 cm nonspecific osseous lesion. It is possible this is related to the rib articulation as versus primary osseous lesion. No other worrisome bony lesions or history of cancer to suggest this is a significant finding. Minimal bulge T10-11 level suspected. T11-12 and T12-L1 unremarkable. L1-2:  Minimal retrolisthesis L1.  Minimal bulge. L2-3: Disc degeneration with mild adjacent endplate reactive changes. Bulge. No significant spinal stenosis or foraminal narrowing. L3-4: Minimal to mild bulge. No significant spinal stenosis or foraminal narrowing. L4-5: Moderate facet degenerative changes. Bulge. Very mild foraminal narrowing without nerve root compression. No significant spinal stenosis. L5-S1:  Mild facet degenerative changes. IMPRESSION:  Degenerative changes throughout the lumbar spine most notable L4-5 level however, no significant spinal stenosis, foraminal narrowing or nerve root compression detected. Please see above for further detail. Electronically Signed   By: Genia Del M.D.   On: 02/15/2016 21:35    Assessment & Plan:   Zytavious was seen today for hypothyroidism and hyperlipidemia.  Diagnoses and all orders for this visit:  Acquired hypothyroidism- His TSH is suppressed down to 0.02.  I have asked him to change to the dose that was prescribed, 200 mcg a day.  I have asked him to come back in 2 to 3 months to recheck his TSH. -     TSH; Future -     TSH  Pure hyperglyceridemia- Improvement noted -     Lipid panel; Future -     Lipid panel  Hyperlipidemia with target LDL less than 130 -     Lipid panel; Future -     Lipid panel  Essential hypertension -     irbesartan (AVAPRO) 150 MG tablet; Take 1 tablet (150 mg total) by mouth daily.  Other orders -     DULoxetine (CYMBALTA) 30 MG capsule; Take 1 capsule (30 mg total) by mouth daily. -     LDL cholesterol, direct   I have discontinued Izear [address]. Circle's DULoxetine and Edarbi. I am also having him start on DULoxetine and  irbesartan. Additionally, I am having him maintain his aspirin, clonazePAM, fluticasone, levocetirizine, omega-3 acid ethyl esters, Cholecalciferol, levothyroxine, and oxyCODONE-acetaminophen.  Meds ordered this encounter  Medications  . DULoxetine (CYMBALTA) 30 MG capsule    Sig: Take 1 capsule (30 mg total) by mouth daily.    Dispense:  30 capsule    Refill:  3  . irbesartan (AVAPRO) 150 MG tablet    Sig: Take 1 tablet (150 mg total) by mouth daily.    Dispense:  90 tablet    Refill:  1     Follow-up: Return in about 4 months (around 05/06/2020).  Scarlette Calico, MD

## 2020-01-05 NOTE — Patient Instructions (Signed)
Hypothyroidism  Hypothyroidism is when the thyroid gland does not make enough of certain hormones (it is underactive). The thyroid gland is a small gland located in the lower front part of the neck, just in front of the windpipe (trachea). This gland makes hormones that help control how the body uses food for energy (metabolism) as well as how the heart and brain function. These hormones also play a role in keeping your bones strong. When the thyroid is underactive, it produces too little of the hormones thyroxine (T4) and triiodothyronine (T3). What are the causes? This condition may be caused by:  Hashimoto's disease. This is a disease in which the body's disease-fighting system (immune system) attacks the thyroid gland. This is the most common cause.  Viral infections.  Pregnancy.  Certain medicines.  Birth defects.  Past radiation treatments to the head or neck for cancer.  Past treatment with radioactive iodine.  Past exposure to radiation in the environment.  Past surgical removal of part or all of the thyroid.  Problems with a gland in the center of the brain (pituitary gland).  Lack of enough iodine in the diet. What increases the risk? You are more likely to develop this condition if:  You are male.  You have a family history of thyroid conditions.  You use a medicine called lithium.  You take medicines that affect the immune system (immunosuppressants). What are the signs or symptoms? Symptoms of this condition include:  Feeling as though you have no energy (lethargy).  Not being able to tolerate cold.  Weight gain that is not explained by a change in diet or exercise habits.  Lack of appetite.  Dry skin.  Coarse hair.  Menstrual irregularity.  Slowing of thought processes.  Constipation.  Sadness or depression. How is this diagnosed? This condition may be diagnosed based on:  Your symptoms, your medical history, and a physical exam.  Blood  tests. You may also have imaging tests, such as an ultrasound or MRI. How is this treated? This condition is treated with medicine that replaces the thyroid hormones that your body does not make. After you begin treatment, it may take several weeks for symptoms to go away. Follow these instructions at home:  Take over-the-counter and prescription medicines only as told by your health care provider.  If you start taking any new medicines, tell your health care provider.  Keep all follow-up visits as told by your health care provider. This is important. ? As your condition improves, your dosage of thyroid hormone medicine may change. ? You will need to have blood tests regularly so that your health care provider can monitor your condition. Contact a health care provider if:  Your symptoms do not get better with treatment.  You are taking thyroid replacement medicine and you: ? Sweat a lot. ? Have tremors. ? Feel anxious. ? Lose weight rapidly. ? Cannot tolerate heat. ? Have emotional swings. ? Have diarrhea. ? Feel weak. Get help right away if you have:  Chest pain.  An irregular heartbeat.  A rapid heartbeat.  Difficulty breathing. Summary  Hypothyroidism is when the thyroid gland does not make enough of certain hormones (it is underactive).  When the thyroid is underactive, it produces too little of the hormones thyroxine (T4) and triiodothyronine (T3).  The most common cause is Hashimoto's disease, a disease in which the body's disease-fighting system (immune system) attacks the thyroid gland. The condition can also be caused by viral infections, medicine, pregnancy, or past   radiation treatment to the head or neck.  Symptoms may include weight gain, dry skin, constipation, feeling as though you do not have energy, and not being able to tolerate cold.  This condition is treated with medicine to replace the thyroid hormones that your body does not make. This information  is not intended to replace advice given to you by your health care provider. Make sure you discuss any questions you have with your health care provider. Document Revised: 09/11/2017 Document Reviewed: 09/09/2017 Elsevier Patient Education  2020 [employer]  

## 2020-01-06 ENCOUNTER — Encounter: Payer: Self-pay | Admitting: Internal Medicine

## 2020-01-06 DIAGNOSIS — Z20828 Contact with and (suspected) exposure to other viral communicable diseases: Secondary | ICD-10-CM | POA: Diagnosis not present

## 2020-01-06 LAB — LIPID PANEL
Cholesterol: 210 mg/dL — ABNORMAL HIGH (ref 0–200)
HDL: 35.3 mg/dL — ABNORMAL LOW (ref >39.00)
NonHDL: 175.14
Total CHOL/HDL Ratio: 6
Triglycerides: 329 mg/dL — ABNORMAL HIGH (ref 0.0–149.0)
VLDL: 65.8 mg/dL — ABNORMAL HIGH (ref 0.0–40.0)

## 2020-01-06 LAB — TSH: TSH: 0.02 u[IU]/mL — ABNORMAL LOW (ref 0.35–4.50)

## 2020-01-06 LAB — LDL CHOLESTEROL, DIRECT: Direct LDL: 135 mg/dL

## 2020-01-06 NOTE — Telephone Encounter (Signed)
This was changed yesterday at office visit.

## 2020-01-09 ENCOUNTER — Encounter: Payer: Self-pay | Admitting: Internal Medicine

## 2020-01-09 ENCOUNTER — Other Ambulatory Visit: Payer: Self-pay | Admitting: Internal Medicine

## 2020-01-09 DIAGNOSIS — G8929 Other chronic pain: Secondary | ICD-10-CM

## 2020-01-09 DIAGNOSIS — M51369 Other intervertebral disc degeneration, lumbar region without mention of lumbar back pain or lower extremity pain: Secondary | ICD-10-CM

## 2020-01-09 DIAGNOSIS — E039 Hypothyroidism, unspecified: Secondary | ICD-10-CM

## 2020-01-09 DIAGNOSIS — Z20828 Contact with and (suspected) exposure to other viral communicable diseases: Secondary | ICD-10-CM | POA: Diagnosis not present

## 2020-01-09 DIAGNOSIS — M545 Low back pain, unspecified: Secondary | ICD-10-CM

## 2020-01-09 DIAGNOSIS — M5136 Other intervertebral disc degeneration, lumbar region: Secondary | ICD-10-CM

## 2020-01-09 MED ORDER — LEVOTHYROXINE SODIUM 200 MCG PO TABS: 200.0000 ug | ORAL_TABLET | Freq: Every day | ORAL | 0 refills | Status: DC

## 2020-01-09 MED ORDER — OXYCODONE-ACETAMINOPHEN 10-325 MG PO TABS: 1.0000 | ORAL_TABLET | Freq: Three times a day (TID) | ORAL | 0 refills | Status: DC | PRN

## 2020-01-09 NOTE — Telephone Encounter (Signed)
    Patient calling to request refill   1.Medication Requested: levothyroxine (SYNTHROID) 200 MCG tablet oxyCODONE-acetaminophen (PERCOCET) 10-325 MG tablet  2. Pharmacy (Name, [facility], [place]): [facility]  3. On Med List: yes  4. Last Visit with PCP:  01/05/20  5. Next visit date with PCP:   Agent: Please be advised that RX refills may take up to 3 business days. We ask that you follow-up with your pharmacy.

## 2020-01-20 DIAGNOSIS — Z20828 Contact with and (suspected) exposure to other viral communicable diseases: Secondary | ICD-10-CM | POA: Diagnosis not present

## 2020-01-24 ENCOUNTER — Other Ambulatory Visit: Payer: Self-pay | Admitting: Internal Medicine

## 2020-01-24 DIAGNOSIS — F411 Generalized anxiety disorder: Secondary | ICD-10-CM

## 2020-01-24 DIAGNOSIS — F410 Panic disorder [episodic paroxysmal anxiety] without agoraphobia: Secondary | ICD-10-CM

## 2020-01-25 ENCOUNTER — Ambulatory Visit: Payer: BC Managed Care – PPO | Attending: Internal Medicine

## 2020-01-25 DIAGNOSIS — Z23 Encounter for immunization: Secondary | ICD-10-CM

## 2020-01-25 NOTE — Progress Notes (Signed)
   Covid-19 Vaccination [facility]  Name:  KAWIKA ZAFAR    MRN: [id number] DOB: 01-Sep-1966  01/25/2020  Mr. Nealy was observed post Covid-19 immunization for 15 minutes without incident. He was provided with Vaccine Information Sheet and instruction to access the V-Safe system.   Mr. Forshee was instructed to call 911 with any severe reactions post vaccine: Marland Kitchen Difficulty breathing  . Swelling of face and throat  . A fast heartbeat  . A bad rash all over body  . Dizziness and weakness   Immunizations Administered    Name Date Dose VIS Date Route   Pfizer COVID-19 Vaccine 01/25/2020 11:40 AM 0.3 mL 09/23/2019 Intramuscular   Manufacturer: [place]   Lot: [id number]   [place]: [id number]

## 2020-02-01 ENCOUNTER — Encounter: Payer: Self-pay | Admitting: Internal Medicine

## 2020-02-09 ENCOUNTER — Other Ambulatory Visit: Payer: Self-pay

## 2020-02-09 ENCOUNTER — Ambulatory Visit (INDEPENDENT_AMBULATORY_CARE_PROVIDER_SITE_OTHER): Payer: BC Managed Care – PPO | Admitting: Otolaryngology

## 2020-02-09 ENCOUNTER — Other Ambulatory Visit: Payer: Self-pay | Admitting: Internal Medicine

## 2020-02-09 ENCOUNTER — Encounter (INDEPENDENT_AMBULATORY_CARE_PROVIDER_SITE_OTHER): Payer: Self-pay | Admitting: Otolaryngology

## 2020-02-09 VITALS — Temp 97.5°F

## 2020-02-09 DIAGNOSIS — J3489 Other specified disorders of nose and nasal sinuses: Secondary | ICD-10-CM | POA: Diagnosis not present

## 2020-02-09 DIAGNOSIS — D385 Neoplasm of uncertain behavior of other respiratory organs: Secondary | ICD-10-CM | POA: Diagnosis not present

## 2020-02-09 DIAGNOSIS — M545 Low back pain, unspecified: Secondary | ICD-10-CM

## 2020-02-09 DIAGNOSIS — G8929 Other chronic pain: Secondary | ICD-10-CM

## 2020-02-09 DIAGNOSIS — M5136 Other intervertebral disc degeneration, lumbar region: Secondary | ICD-10-CM

## 2020-02-09 NOTE — Progress Notes (Signed)
HPI: Jonathan Luna is a 54 y.o. male who presents is referred by his PCP for evaluation of chronic nasal obstruction.  He has also noticed some popping and crackling in his ears.  He states that he gets 1-2 sinus infections per year.  He does have history of allergic rhinitis and uses mometasone nasal spray in the mornings. He has noticed a recurrent scab in the right nostril where he picks at his nose.Marland Kitchen  Past Medical History:  Diagnosis Date  . Allergy   . Anxiety   . Arthritis   . Cataract   . Depression   . Heart murmur   . History of bilateral cataract extraction   . Hyperlipidemia   . Hypothyroidism   . Low back pain   . Panic attack   . Panic attacks    Past Surgical History:  Procedure Laterality Date  . CATARACT EXTRACTION    . EYE SURGERY     Social History   Socioeconomic History  . Marital status: Married    Spouse name: Not on file  . Number of children: Not on file  . Years of education: Not on file  . Highest education level: Not on file  Occupational History  . Not on file  Tobacco Use  . Smoking status: Never Smoker  . Smokeless tobacco: Never Used  Substance and Sexual Activity  . Alcohol use: Yes    Alcohol/week: 2.0 standard drinks    Types: 2 Cans of beer per week  . Drug use: No  . Sexual activity: Yes    Birth control/protection: Condom  Other Topics Concern  . Not on file  Social History Narrative   Caffienated drinks-no   Seat belt use often-yes   Regular Exercise-yes   Smoke alarm in the home-yes   Firearms/guns in the home-no   History of physical abuse-no               Social Determinants of Health   Financial Resource Strain:   . Difficulty of Paying Living Expenses:   Food Insecurity:   . Worried About Charity fundraiser in the Last Year:   . Arboriculturist in the Last Year:   Transportation Needs:   . Film/video editor (Medical):   Marland Kitchen Lack of Transportation (Non-Medical):   Physical Activity:   . Days of Exercise  per Week:   . Minutes of Exercise per Session:   Stress:   . Feeling of Stress :   Social Connections:   . Frequency of Communication with Friends and Family:   . Frequency of Social Gatherings with Friends and Family:   . Attends Religious Services:   . Active Member of Clubs or Organizations:   . Attends Archivist Meetings:   Marland Kitchen Marital Status:    Family History  Problem Relation Age of Onset  . Hypertension Father   . Heart disease Father   . Breast cancer Mother   . Cancer Mother   . Diabetes Brother   . Hyperlipidemia Brother    Allergies  Allergen Reactions  . Fluoxetine Hcl   . Venlafaxine    Prior to Admission medications   Medication Sig Start Date End Date Taking? Authorizing Provider  aspirin 81 MG tablet Take 81 mg by mouth daily.   Yes [provider]  Cholecalciferol 50 MCG (2000 UT) TABS Take 2 tablets (4,000 Units total) by mouth daily. 12/10/19  Yes Janith Lima, MD  clonazePAM (KLONOPIN) 1 MG  tablet TAKE ONE TABLET BY MOUTH THREE TIMES A DAY AS NEEDED FOR ANXIETY  01/24/20  Yes Janith Lima, MD  DULoxetine (CYMBALTA) 30 MG capsule Take 1 capsule (30 mg total) by mouth daily. 01/05/20  Yes Janith Lima, MD  fluticasone (FLONASE) 50 MCG/ACT nasal spray Place 2 sprays into both nostrils daily. 12/05/19  Yes Janith Lima, MD  irbesartan (AVAPRO) 150 MG tablet Take 1 tablet (150 mg total) by mouth daily. 01/05/20  Yes Janith Lima, MD  levocetirizine (XYZAL) 5 MG tablet Take 1 tablet (5 mg total) by mouth every evening. 12/05/19  Yes Janith Lima, MD  levothyroxine (SYNTHROID) 200 MCG tablet Take 1 tablet (200 mcg total) by mouth daily before breakfast. 01/09/20  Yes Janith Lima, MD  omega-3 acid ethyl esters (LOVAZA) 1 g capsule TAKE TWO CAPSULES BY MOUTH TWICE DAILY  12/07/19  Yes Janith Lima, MD  oxyCODONE-acetaminophen (PERCOCET) 10-325 MG tablet Take 1 tablet by mouth every 8 hours as needed for pain 02/09/20  Yes Janith Lima, MD  omega-3 acid ethyl esters (LOVAZA) 1 g capsule TAKE TWO CAPSULES BY MOUTH TWICE DAILY  06/22/19   Janith Lima, MD     Positive ROS: Otherwise negative  All other systems have been reviewed and were otherwise negative with the exception of those mentioned in the HPI and as above.  Physical Exam: Constitutional: Alert, well-appearing, no acute distress Ears: External ears without lesions or tenderness. Ear canals are clear bilaterally.  He makes minimal wax.  TMs are clear bilaterally with good mobility on pneumatic otoscopy.  No middle ear effusion noted.  On hearing screening hears well in both ears with AC > BC bilaterally. Nasal: External nose without lesions. Septum is midline with mild rhinitis..  Of note he has some scabbing or crusting along the inferior septum on the right side.  This was cleaned in the office and I applied silver nitrate to burn this area since apparently gets recurrent scabbing or crusting in this area.  No polyps noted.  Both middle meatus regions are clear with no signs of active infection presently.  His nasal obstruction seems to be more related to his nasal valve collapse than any intranasal obstruction. Oral: Lips and gums without lesions. Tongue and palate mucosa without lesions. Posterior oropharynx clear. Neck: No palpable adenopathy or masses Respiratory: Breathing comfortably  Skin: No facial/neck lesions or rash noted.  I cauterized the right septal lesion in the office today using silver nitrate.  Patient tolerated this well.  Procedures  Assessment: Mild rhinitis. 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 Recommended using his nasal steroid spray 2 sprays each nostril at night instead of first thing in the morning. I cauterized the right inferior septum in the area where he states that he gets  recurrent scabbing.  Recommend he not pick at this area for the next few weeks as the scab will gradually heal and fall off within the next 2 to 3 weeks. He will follow-up if he has any recurrent scabbing on the right side intranasally.   Radene Journey, MD   CC:

## 2020-03-01 ENCOUNTER — Encounter: Payer: Self-pay | Admitting: Internal Medicine

## 2020-03-01 NOTE — Telephone Encounter (Signed)
Key: [id number]

## 2020-03-09 ENCOUNTER — Other Ambulatory Visit: Payer: Self-pay | Admitting: Internal Medicine

## 2020-03-09 DIAGNOSIS — M5136 Other intervertebral disc degeneration, lumbar region: Secondary | ICD-10-CM

## 2020-03-09 DIAGNOSIS — M545 Low back pain, unspecified: Secondary | ICD-10-CM

## 2020-03-09 MED ORDER — OXYCODONE-ACETAMINOPHEN 10-325 MG PO TABS: 1.0000 | ORAL_TABLET | Freq: Three times a day (TID) | ORAL | 0 refills | Status: DC | PRN

## 2020-04-03 ENCOUNTER — Encounter: Payer: Self-pay | Admitting: Internal Medicine

## 2020-04-09 ENCOUNTER — Other Ambulatory Visit: Payer: Self-pay | Admitting: Internal Medicine

## 2020-04-09 DIAGNOSIS — M545 Low back pain, unspecified: Secondary | ICD-10-CM

## 2020-04-09 DIAGNOSIS — E039 Hypothyroidism, unspecified: Secondary | ICD-10-CM

## 2020-04-09 DIAGNOSIS — M5136 Other intervertebral disc degeneration, lumbar region: Secondary | ICD-10-CM

## 2020-04-09 MED ORDER — OXYCODONE-ACETAMINOPHEN 10-325 MG PO TABS: 1.0000 | ORAL_TABLET | Freq: Three times a day (TID) | ORAL | 0 refills | Status: DC | PRN

## 2020-04-09 NOTE — Telephone Encounter (Signed)
Oxycodone refill due today.   PA has been approved.

## 2020-04-30 ENCOUNTER — Other Ambulatory Visit: Payer: Self-pay | Admitting: Internal Medicine

## 2020-04-30 DIAGNOSIS — F410 Panic disorder [episodic paroxysmal anxiety] without agoraphobia: Secondary | ICD-10-CM

## 2020-04-30 DIAGNOSIS — F411 Generalized anxiety disorder: Secondary | ICD-10-CM

## 2020-05-02 ENCOUNTER — Encounter: Payer: Self-pay | Admitting: Internal Medicine

## 2020-05-10 ENCOUNTER — Telehealth: Payer: Self-pay

## 2020-05-10 ENCOUNTER — Other Ambulatory Visit: Payer: Self-pay

## 2020-05-10 ENCOUNTER — Other Ambulatory Visit (INDEPENDENT_AMBULATORY_CARE_PROVIDER_SITE_OTHER): Payer: BC Managed Care – PPO

## 2020-05-10 ENCOUNTER — Encounter: Payer: Self-pay | Admitting: Internal Medicine

## 2020-05-10 ENCOUNTER — Ambulatory Visit: Payer: BC Managed Care – PPO | Admitting: Internal Medicine

## 2020-05-10 VITALS — BP 138/90 | HR 102 | Temp 98.1°F | Ht 68.0 in | Wt 222.0 lb

## 2020-05-10 DIAGNOSIS — G8929 Other chronic pain: Secondary | ICD-10-CM

## 2020-05-10 DIAGNOSIS — E039 Hypothyroidism, unspecified: Secondary | ICD-10-CM

## 2020-05-10 DIAGNOSIS — I10 Essential (primary) hypertension: Secondary | ICD-10-CM

## 2020-05-10 DIAGNOSIS — M5136 Other intervertebral disc degeneration, lumbar region: Secondary | ICD-10-CM

## 2020-05-10 DIAGNOSIS — E781 Pure hyperglyceridemia: Secondary | ICD-10-CM

## 2020-05-10 DIAGNOSIS — M545 Low back pain, unspecified: Secondary | ICD-10-CM

## 2020-05-10 LAB — BASIC METABOLIC PANEL
BUN: 12 mg/dL (ref 6–23)
CO2: 26 mEq/L (ref 19–32)
Calcium: 9.5 mg/dL (ref 8.4–10.5)
Chloride: 101 mEq/L (ref 96–112)
Creatinine, Ser: 0.94 mg/dL (ref 0.40–1.50)
GFR: 83.62 mL/min (ref >60.00)
Glucose, Bld: 95 mg/dL (ref 70–99)
Potassium: 4.2 mEq/L (ref 3.5–5.1)
Sodium: 136 mEq/L (ref 135–145)

## 2020-05-10 LAB — TRIGLYCERIDES: Triglycerides: 352 mg/dL — ABNORMAL HIGH (ref 0.0–149.0)

## 2020-05-10 MED ORDER — OXYCODONE-ACETAMINOPHEN 10-325 MG PO TABS: 1.0000 | ORAL_TABLET | Freq: Three times a day (TID) | ORAL | 0 refills | Status: DC | PRN

## 2020-05-10 NOTE — Progress Notes (Signed)
Subjective:  Patient ID: Jonathan Luna, male    DOB: 01-20-1966  Age: 54 y.o. MRN: [id number]  CC: Back Pain, Hypothyroidism, Hyperlipidemia, and Hypertension  This visit occurred during the SARS-CoV-2 public health emergency.  Safety protocols were in place, including screening questions prior to the visit, additional usage of staff PPE, and extensive cleaning of exam room while observing appropriate contact time as indicated for disinfecting solutions.    HPI DIAGO HAIK presents for f/up - He continues to complain of low back pain.  He is taking the Percocet about 2-3 times a day but he wants to increase the amount to 90 tablets/month.  He says he is not able to exercise or complete his work activities if he can take it 3 times a day consistently.  He does not monitor his blood pressure.  He says he has not been able to exercise recently because of musculoskeletal pain.  He has been able to lose a few pounds with dietary changes.  He denies any recent episodes of headache, blurred vision, chest pain, shortness of breath, edema, or fatigue.  Outpatient Medications Prior to Visit  Medication Sig Dispense Refill  . aspirin 81 MG tablet Take 81 mg by mouth daily.    . Cholecalciferol 50 MCG (2000 UT) TABS Take 2 tablets (4,000 Units total) by mouth daily. 180 tablet 1  . clonazePAM (KLONOPIN) 1 MG tablet TAKE ONE TABLET BY MOUTH THREE TIMES A DAY AS NEEDED FOR ANXIETY 90 tablet 0  . DULoxetine (CYMBALTA) 30 MG capsule Take 1 capsule (30 mg total) by mouth daily. 30 capsule 3  . fluticasone (FLONASE) 50 MCG/ACT nasal spray Place 2 sprays into both nostrils daily. 48 g 1  . levocetirizine (XYZAL) 5 MG tablet Take 1 tablet (5 mg total) by mouth every evening. 90 tablet 1  . omega-3 acid ethyl esters (LOVAZA) 1 g capsule TAKE TWO CAPSULES BY MOUTH TWICE DAILY  360 capsule 1  . irbesartan (AVAPRO) 150 MG tablet Take 1 tablet (150 mg total) by mouth daily. 90 tablet 1  . levothyroxine (SYNTHROID) 150  MCG tablet     . levothyroxine (SYNTHROID) 200 MCG tablet Take 1 tablet by mouth daily before breakfast 90 tablet 0  . oxyCODONE-acetaminophen (PERCOCET) 10-325 MG tablet Take 1 tablet by mouth every 8 (eight) hours as needed. for pain 75 tablet 0   No facility-administered medications prior to visit.    ROS Review of Systems  Constitutional: Negative for appetite change, diaphoresis, fatigue and unexpected weight change.  HENT: Negative.   Eyes: Negative for visual disturbance.  Respiratory: Negative for cough, chest tightness, shortness of breath and wheezing.   Cardiovascular: Negative for chest pain, palpitations and leg swelling.  Gastrointestinal: Negative for abdominal pain, constipation, diarrhea, nausea and vomiting.  Endocrine: Negative.  Negative for cold intolerance, heat intolerance and polydipsia.  Genitourinary: Negative.  Negative for difficulty urinating.  Musculoskeletal: Positive for back pain. Negative for arthralgias, myalgias and neck pain.  Skin: Negative.  Negative for color change and pallor.  Neurological: Negative.  Negative for dizziness, weakness, light-headedness and headaches.  Hematological: Negative for adenopathy. Does not bruise/bleed easily.  Psychiatric/Behavioral: Negative for decreased concentration, dysphoric mood, sleep disturbance and suicidal ideas. The patient is nervous/anxious.     Objective:  BP (!) 138/90 (BP Location: Left Arm, Patient Position: Sitting, Cuff Size: Large)   Pulse 102   Temp 98.1 F (36.7 C) (Oral)   Ht 5\' 8"  (1.727 m)   Wt Marland Kitchen)  222 lb (100.7 kg)   SpO2 97%   BMI 33.75 kg/m   BP Readings from Last 3 Encounters:  05/10/20 (!) 138/90  01/05/20 130/76  12/05/19 132/84    Wt Readings from Last 3 Encounters:  05/10/20 (!) 222 lb (100.7 kg)  01/05/20 225 lb (102.1 kg)  12/05/19 226 lb (102.5 kg)    Physical Exam Vitals reviewed.  Constitutional:      General: He is not in acute distress.    Appearance: Normal  appearance. He is not ill-appearing or toxic-appearing.  HENT:     Nose: Nose normal.     Mouth/Throat:     Mouth: Mucous membranes are moist.  Eyes:     General: No scleral icterus.    Conjunctiva/sclera: Conjunctivae normal.  Cardiovascular:     Rate and Rhythm: Normal rate and regular rhythm.     Heart sounds: No murmur heard.   Pulmonary:     Effort: Pulmonary effort is normal.     Breath sounds: No stridor. No wheezing, rhonchi or rales.  Abdominal:     General: Abdomen is protuberant. Bowel sounds are normal.     Palpations: There is no hepatomegaly, splenomegaly or mass.  Musculoskeletal:        General: Normal range of motion.     Cervical back: Neck supple.     Right lower leg: No edema.     Left lower leg: No edema.  Lymphadenopathy:     Cervical: No cervical adenopathy.  Skin:    General: Skin is warm.     Coloration: Skin is not pale.  Neurological:     General: No focal deficit present.     Mental Status: He is alert and oriented to person, place, and time. Mental status is at baseline.  Psychiatric:        Mood and Affect: Mood normal.        Behavior: Behavior normal.     Lab Results  Component Value Date   WBC 10.4 12/05/2019   HGB 14.5 12/05/2019   HCT 41.7 12/05/2019   PLT 346.0 12/05/2019   GLUCOSE 95 05/10/2020   CHOL 210 (H) 01/05/2020   TRIG 352.0 (H) 05/10/2020   HDL 35.30 (L) 01/05/2020   LDLDIRECT 135.0 01/05/2020   LDLCALC 106 (H) 04/07/2017   ALT 37 12/05/2019   AST 28 12/05/2019   NA 136 05/10/2020   K 4.2 05/10/2020   CL 101 05/10/2020   CREATININE 0.94 05/10/2020   BUN 12 05/10/2020   CO2 26 05/10/2020   TSH 0.02 (L) 05/10/2020   PSA 1.52 12/14/2018   HGBA1C 5.2 07/28/2012    [name]  Result Date: 02/15/2016 CLINICAL DATA:  54 year old male with chronic symptoms getting worse. Bilateral lower back pain restricting motion. Burning and anterior left high. Initial encounter. EXAM: MRI LUMBAR SPINE WITHOUT  CONTRAST TECHNIQUE: Multiplanar, multisequence MR imaging of the lumbar spine was performed. No intravenous contrast was administered. COMPARISON:  No comparison MR. 04/15/2007 CT abdomen and pelvis. FINDINGS: Last fully open disk space is labeled L5-S1. Present examination incorporates from T11 through upper S3 level. Conus upper L1 level. Remote Schmorl's node deformity L4 superior endplate. Questioned left T11 1 cm nonspecific osseous lesion. It is possible this is related to the rib articulation as versus primary osseous lesion. No other worrisome bony lesions or history of cancer to suggest this is a significant finding. Minimal bulge T10-11 level suspected. T11-12 and T12-L1 unremarkable. L1-2:  Minimal retrolisthesis L1.  Minimal bulge. L2-3: Disc degeneration with mild adjacent endplate reactive changes. Bulge. No significant spinal stenosis or foraminal narrowing. L3-4: Minimal to mild bulge. No significant spinal stenosis or foraminal narrowing. L4-5: Moderate facet degenerative changes. Bulge. Very mild foraminal narrowing without nerve root compression. No significant spinal stenosis. L5-S1:  Mild facet degenerative changes. IMPRESSION: Degenerative changes throughout the lumbar spine most notable L4-5 level however, no significant spinal stenosis, foraminal narrowing or nerve root compression detected. Please see above for further detail. Electronically Signed   By: Genia Del M.D.   On: 02/15/2016 21:35    Assessment & Plan:   Kuba was seen today for back pain, hypothyroidism, hyperlipidemia and hypertension.  Diagnoses and all orders for this visit:  Essential hypertension- His blood pressure is not adequately well controlled.  He agrees to improve his lifestyle modifications.  Will increase the dose of the ARB. -     BASIC METABOLIC PANEL WITH GFR; Future -     irbesartan (AVAPRO) 300 MG tablet; Take 1 tablet (300 mg total) by mouth daily.  Acquired hypothyroidism- His TSH is  suppressed.  I recommended a decrease in his levothyroxine dose. -     Cancel: TSH; Future -     TSH; Future -     levothyroxine (SYNTHROID) 150 MCG tablet; Take 1 tablet (150 mcg total) by mouth daily before breakfast.  Pure hyperglyceridemia- His triglycerides remain elevated.  Will continue the fish oil supplement.  He also agrees to improve his lifestyle modifications. -     Cancel: Triglycerides; Future -     Triglycerides; Future  DDD (degenerative disc disease), lumbar- Will increase the Percocet to 3 times a day in the hopes that he can complete his ADLs, exercise more, and complete his tasks at work. -     Discontinue: oxyCODONE-acetaminophen (PERCOCET) 10-325 MG tablet; Take 1 tablet by mouth every 8 (eight) hours as needed. for pain -     oxyCODONE-acetaminophen (PERCOCET) 10-325 MG tablet; Take 1 tablet by mouth every 8 (eight) hours as needed. for pain  Chronic low back pain without sciatica, unspecified back pain laterality -     Discontinue: oxyCODONE-acetaminophen (PERCOCET) 10-325 MG tablet; Take 1 tablet by mouth every 8 (eight) hours as needed. for pain -     oxyCODONE-acetaminophen (PERCOCET) 10-325 MG tablet; Take 1 tablet by mouth every 8 (eight) hours as needed. for pain   I have discontinued Kamaree L. Wuertz's irbesartan and levothyroxine. I have also changed his levothyroxine. Additionally, I am having him start on irbesartan. Lastly, I am having him maintain his aspirin, fluticasone, levocetirizine, omega-3 acid ethyl esters, Cholecalciferol, DULoxetine, clonazePAM, and oxyCODONE-acetaminophen.  Meds ordered this encounter  Medications  . DISCONTD: oxyCODONE-acetaminophen (PERCOCET) 10-325 MG tablet    Sig: Take 1 tablet by mouth every 8 (eight) hours as needed. for pain    Dispense:  75 tablet    Refill:  0  . oxyCODONE-acetaminophen (PERCOCET) 10-325 MG tablet    Sig: Take 1 tablet by mouth every 8 (eight) hours as needed. for pain    Dispense:  90 tablet     Refill:  0  . levothyroxine (SYNTHROID) 150 MCG tablet    Sig: Take 1 tablet (150 mcg total) by mouth daily before breakfast.    Dispense:  90 tablet    Refill:  0  . irbesartan (AVAPRO) 300 MG tablet    Sig: Take 1 tablet (300 mg total) by mouth daily.    Dispense:  90 tablet  Refill:  1   I spent 50 minutes in preparing to see the patient by review of recent labs, imaging and procedures, obtaining and reviewing separately obtained history, communicating with the patient and family or caregiver, ordering medications, tests or procedures, and documenting clinical information in the EHR including the differential Dx, treatment, and any further evaluation and other management of 1. Essential hypertension 2. Acquired hypothyroidism 3. Pure hyperglyceridemia 4. DDD (degenerative disc disease), lumbar 5. Chronic low back pain without sciatica, unspecified back pain laterality      Follow-up: No follow-ups on file.  Scarlette Calico, MD

## 2020-05-10 NOTE — Telephone Encounter (Signed)
Key: [id number]

## 2020-05-10 NOTE — Telephone Encounter (Signed)
Pt is due for a refill of oxycodone.   PA was completed and approved.

## 2020-05-11 LAB — TSH: TSH: 0.02 u[IU]/mL — ABNORMAL LOW (ref 0.35–4.50)

## 2020-05-12 ENCOUNTER — Encounter: Payer: Self-pay | Admitting: Internal Medicine

## 2020-05-14 MED ORDER — IRBESARTAN 300 MG PO TABS: 300.0000 mg | ORAL_TABLET | Freq: Every day | ORAL | 1 refills | Status: DC

## 2020-05-14 MED ORDER — LEVOTHYROXINE SODIUM 150 MCG PO TABS: 150.0000 ug | ORAL_TABLET | Freq: Every day | ORAL | 0 refills | Status: DC

## 2020-06-01 ENCOUNTER — Other Ambulatory Visit: Payer: Self-pay | Admitting: Internal Medicine

## 2020-06-01 DIAGNOSIS — F410 Panic disorder [episodic paroxysmal anxiety] without agoraphobia: Secondary | ICD-10-CM

## 2020-06-01 DIAGNOSIS — F411 Generalized anxiety disorder: Secondary | ICD-10-CM

## 2020-06-02 ENCOUNTER — Encounter: Payer: Self-pay | Admitting: Internal Medicine

## 2020-06-05 NOTE — Telephone Encounter (Signed)
PA for oxycodone has been started.   Key: Midge Aver

## 2020-06-06 NOTE — Telephone Encounter (Signed)
PA has been approved.   Refill is due on 06/10/2020.

## 2020-06-07 ENCOUNTER — Other Ambulatory Visit: Payer: Self-pay | Admitting: Internal Medicine

## 2020-06-07 DIAGNOSIS — M5136 Other intervertebral disc degeneration, lumbar region: Secondary | ICD-10-CM

## 2020-06-07 DIAGNOSIS — M545 Low back pain, unspecified: Secondary | ICD-10-CM

## 2020-06-07 MED ORDER — OXYCODONE-ACETAMINOPHEN 10-325 MG PO TABS: 1.0000 | ORAL_TABLET | Freq: Three times a day (TID) | ORAL | 0 refills | Status: DC | PRN

## 2020-07-02 ENCOUNTER — Encounter: Payer: Self-pay | Admitting: Internal Medicine

## 2020-07-03 NOTE — Telephone Encounter (Signed)
Key: [id number]  Effective from 07/03/2020 through 08/01/2020.

## 2020-07-09 ENCOUNTER — Telehealth: Payer: Self-pay | Admitting: Internal Medicine

## 2020-07-09 ENCOUNTER — Other Ambulatory Visit: Payer: Self-pay | Admitting: Internal Medicine

## 2020-07-09 DIAGNOSIS — M545 Low back pain, unspecified: Secondary | ICD-10-CM

## 2020-07-09 DIAGNOSIS — M5136 Other intervertebral disc degeneration, lumbar region: Secondary | ICD-10-CM

## 2020-07-09 MED ORDER — OXYCODONE-ACETAMINOPHEN 10-325 MG PO TABS: 1.0000 | ORAL_TABLET | Freq: Three times a day (TID) | ORAL | 0 refills | Status: DC | PRN

## 2020-07-09 NOTE — Telephone Encounter (Signed)
Patient is requesting a refill on the following medication : oxyCODONE-acetaminophen (PERCOCET) 10-325 MG tablet  [id number] - [place], [place] Phone:  [phone]  Fax:  [phone]

## 2020-07-23 ENCOUNTER — Encounter: Payer: Self-pay | Admitting: Internal Medicine

## 2020-07-23 ENCOUNTER — Other Ambulatory Visit: Payer: Self-pay | Admitting: Internal Medicine

## 2020-07-23 DIAGNOSIS — I10 Essential (primary) hypertension: Secondary | ICD-10-CM

## 2020-07-23 MED ORDER — IRBESARTAN 300 MG PO TABS: 300.0000 mg | ORAL_TABLET | Freq: Every day | ORAL | 1 refills | Status: DC

## 2020-08-02 ENCOUNTER — Encounter: Payer: Self-pay | Admitting: Internal Medicine

## 2020-08-03 NOTE — Telephone Encounter (Signed)
Key: [id number]  Approved

## 2020-08-07 ENCOUNTER — Other Ambulatory Visit: Payer: Self-pay | Admitting: Internal Medicine

## 2020-08-07 DIAGNOSIS — F419 Anxiety disorder, unspecified: Secondary | ICD-10-CM

## 2020-08-07 DIAGNOSIS — F32A Depression, unspecified: Secondary | ICD-10-CM

## 2020-08-07 DIAGNOSIS — G894 Chronic pain syndrome: Secondary | ICD-10-CM

## 2020-08-07 MED ORDER — DULOXETINE HCL 30 MG PO CPEP: 30.0000 mg | ORAL_CAPSULE | Freq: Every day | ORAL | 3 refills | Status: DC

## 2020-08-08 ENCOUNTER — Ambulatory Visit: Payer: BC Managed Care – PPO | Admitting: Internal Medicine

## 2020-08-08 ENCOUNTER — Other Ambulatory Visit (INDEPENDENT_AMBULATORY_CARE_PROVIDER_SITE_OTHER): Payer: BC Managed Care – PPO

## 2020-08-08 ENCOUNTER — Other Ambulatory Visit: Payer: Self-pay

## 2020-08-08 ENCOUNTER — Encounter: Payer: Self-pay | Admitting: Internal Medicine

## 2020-08-08 VITALS — BP 136/86 | HR 100 | Temp 98.6°F | Resp 16 | Ht 68.0 in | Wt 220.0 lb

## 2020-08-08 DIAGNOSIS — M545 Low back pain, unspecified: Secondary | ICD-10-CM

## 2020-08-08 DIAGNOSIS — E039 Hypothyroidism, unspecified: Secondary | ICD-10-CM

## 2020-08-08 DIAGNOSIS — Z0000 Encounter for general adult medical examination without abnormal findings: Secondary | ICD-10-CM | POA: Diagnosis not present

## 2020-08-08 DIAGNOSIS — Z125 Encounter for screening for malignant neoplasm of prostate: Secondary | ICD-10-CM

## 2020-08-08 DIAGNOSIS — E785 Hyperlipidemia, unspecified: Secondary | ICD-10-CM

## 2020-08-08 DIAGNOSIS — M5136 Other intervertebral disc degeneration, lumbar region: Secondary | ICD-10-CM

## 2020-08-08 DIAGNOSIS — E781 Pure hyperglyceridemia: Secondary | ICD-10-CM

## 2020-08-08 DIAGNOSIS — E559 Vitamin D deficiency, unspecified: Secondary | ICD-10-CM | POA: Diagnosis not present

## 2020-08-08 DIAGNOSIS — I10 Essential (primary) hypertension: Secondary | ICD-10-CM | POA: Diagnosis not present

## 2020-08-08 DIAGNOSIS — G8929 Other chronic pain: Secondary | ICD-10-CM

## 2020-08-08 LAB — TSH: TSH: 1.55 u[IU]/mL (ref 0.35–4.50)

## 2020-08-08 LAB — PSA: PSA: 1.19 ng/mL (ref 0.10–4.00)

## 2020-08-08 MED ORDER — OXYCODONE-ACETAMINOPHEN 10-325 MG PO TABS: 1.0000 | ORAL_TABLET | Freq: Three times a day (TID) | ORAL | 0 refills | Status: DC | PRN

## 2020-08-08 NOTE — Patient Instructions (Signed)

## 2020-08-08 NOTE — Progress Notes (Signed)
Subjective:  Patient ID: Jonathan Luna, male    DOB: 11/12/1965  Age: 54 y.o. MRN: [id number]  CC: Annual Exam, Hypertension, Hypothyroidism, and Hyperlipidemia  This visit occurred during the SARS-CoV-2 public health emergency.  Safety protocols were in place, including screening questions prior to the visit, additional usage of staff PPE, and extensive cleaning of exam room while observing appropriate contact time as indicated for disinfecting solutions.    HPI Jonathan Luna presents for a CPX.  1.  He continues to complain of nonradiating low back pain.  He denies lower extremity paresthesias.  He request to increase the Percocet dose to more than 3 times a day.  2.  Continues to complain of anxiety and panic attacks.  He tells me he is under a lot of stress at work.  He is getting adequate symptom relief with clonazepam.  He would like to stay on the current dose of duloxetine.  3.  He tells me his blood pressure has been well controlled.  He is compliant with the ARB.  He denies any recent episodes of headache, blurred vision, chest pain, shortness of breath.  He has not been working much on his lifestyle modifications.  Outpatient Medications Prior to Visit  Medication Sig Dispense Refill  . Ascorbic Acid (VITAMIN C PO) Take by mouth.    Marland Kitchen aspirin 81 MG tablet Take 81 mg by mouth daily.    . Cholecalciferol 50 MCG (2000 UT) TABS Take 2 tablets (4,000 Units total) by mouth daily. 180 tablet 1  . clonazePAM (KLONOPIN) 1 MG tablet TAKE ONE TABLET BY MOUTH THREE TIMES A DAY AS NEEDED FOR ANXIETY 90 tablet 3  . DULoxetine (CYMBALTA) 30 MG capsule Take 1 capsule (30 mg total) by mouth daily. 30 capsule 3  . fluticasone (FLONASE) 50 MCG/ACT nasal spray Place 2 sprays into both nostrils daily. 48 g 1  . irbesartan (AVAPRO) 300 MG tablet Take 1 tablet (300 mg total) by mouth daily. 90 tablet 1  . levocetirizine (XYZAL) 5 MG tablet Take 1 tablet (5 mg total) by mouth every evening. 90 tablet 1  .  levothyroxine (SYNTHROID) 150 MCG tablet Take 1 tablet (150 mcg total) by mouth daily before breakfast. 90 tablet 0  . Multiple Vitamins-Minerals (ZINC PO) Take by mouth.    . Turmeric (QC TUMERIC COMPLEX PO) Take by mouth.    . omega-3 acid ethyl esters (LOVAZA) 1 g capsule TAKE TWO CAPSULES BY MOUTH TWICE DAILY  360 capsule 1  . oxyCODONE-acetaminophen (PERCOCET) 10-325 MG tablet Take 1 tablet by mouth every 8 (eight) hours as needed. for pain 90 tablet 0   No facility-administered medications prior to visit.    ROS Review of Systems  Constitutional: Negative for appetite change, chills, diaphoresis, fatigue, fever and unexpected weight change.  HENT: Negative.   Eyes: Negative for visual disturbance.  Respiratory: Negative for cough, chest tightness, shortness of breath and wheezing.   Cardiovascular: Negative for chest pain, palpitations and leg swelling.  Gastrointestinal: Negative for abdominal pain, constipation, diarrhea, nausea and vomiting.  Endocrine: Negative for cold intolerance and heat intolerance.  Genitourinary: Negative.  Negative for difficulty urinating.  Musculoskeletal: Positive for back pain. Negative for arthralgias, joint swelling, myalgias and neck pain.  Skin: Negative for color change, pallor and rash.  Neurological: Negative.  Negative for dizziness, weakness, light-headedness, numbness and headaches.  Hematological: Negative for adenopathy. Does not bruise/bleed easily.  Psychiatric/Behavioral: Negative for agitation, behavioral problems, confusion, decreased concentration, dysphoric mood, hallucinations,  self-injury, sleep disturbance and suicidal ideas. The patient is nervous/anxious.     Objective:  BP 136/86   Pulse 100   Temp 98.6 F (37 C) (Oral)   Resp 16   Ht 5\' 8"  (1.727 m)   Wt 220 lb (99.8 kg)   SpO2 96%   BMI 33.45 kg/m   BP Readings from Last 3 Encounters:  08/08/20 136/86  05/10/20 (!) 138/90  01/05/20 130/76    Wt Readings from  Last 3 Encounters:  08/08/20 220 lb (99.8 kg)  05/10/20 (!) 222 lb (100.7 kg)  01/05/20 225 lb (102.1 kg)    Physical Exam Vitals reviewed.  Constitutional:      Appearance: Normal appearance.  HENT:     Nose: Nose normal.     Mouth/Throat:     Mouth: Mucous membranes are moist.  Eyes:     General: No scleral icterus.    Conjunctiva/sclera: Conjunctivae normal.  Cardiovascular:     Rate and Rhythm: Normal rate and regular rhythm.     Heart sounds: No murmur heard.      Comments: EKG- NSR, 95 bpm Normal EKG Pulmonary:     Effort: Pulmonary effort is normal.     Breath sounds: No stridor. No wheezing, rhonchi or rales.  Abdominal:     General: Abdomen is flat. Bowel sounds are normal. There is no distension.     Palpations: Abdomen is soft. There is no hepatomegaly, splenomegaly or mass.     Tenderness: There is no abdominal tenderness.     Hernia: There is no hernia in the right inguinal area.  Genitourinary:    Pubic Area: No rash.      Penis: Normal and circumcised. No discharge, swelling or lesions.      Testes: Normal.        Right: Mass not present.        Left: Mass not present.     Epididymis:     Right: Normal.     Left: Normal.     Prostate: Normal. Not enlarged, not tender and no nodules present.     Rectum: Normal. Guaiac result negative. No mass, tenderness, anal fissure, external hemorrhoid or internal hemorrhoid. Normal anal tone.  Musculoskeletal:        General: No swelling. Normal range of motion.     Cervical back: Neck supple.     Right lower leg: No edema.     Left lower leg: No edema.  Lymphadenopathy:     Cervical: No cervical adenopathy.     Lower Body: No right inguinal adenopathy. No left inguinal adenopathy.  Skin:    General: Skin is warm.     Coloration: Skin is not pale.     Findings: No rash.  Neurological:     General: No focal deficit present.     Mental Status: He is alert and oriented to person, place, and time. Mental status  is at baseline.  Psychiatric:        Attention and Perception: Attention and perception normal. He is attentive.        Mood and Affect: Mood is anxious. Mood is not depressed. Affect is not flat.        Speech: Speech is not delayed or tangential.        Behavior: Behavior normal.        Thought Content: Thought content normal.        Judgment: Judgment normal.     Lab Results  Component Value Date  WBC 9.2 08/08/2020   HGB 14.9 08/08/2020   HCT 43.2 08/08/2020   PLT 310.0 08/08/2020   GLUCOSE 93 08/08/2020   CHOL 246 (H) 08/08/2020   TRIG 340.0 (H) 08/08/2020   HDL 41.90 08/08/2020   LDLDIRECT 167.0 08/08/2020   LDLCALC 106 (H) 04/07/2017   ALT 44 08/08/2020   AST 25 08/08/2020   NA 137 08/08/2020   K 4.1 08/08/2020   CL 101 08/08/2020   CREATININE 0.90 08/08/2020   BUN 14 08/08/2020   CO2 23 08/08/2020   TSH 1.55 08/08/2020   PSA 1.19 08/08/2020   HGBA1C 5.2 07/28/2012    [name]  Result Date: 02/15/2016 CLINICAL DATA:  54 year old male with chronic symptoms getting worse. Bilateral lower back pain restricting motion. Burning and anterior left high. Initial encounter. EXAM: MRI LUMBAR SPINE WITHOUT CONTRAST TECHNIQUE: Multiplanar, multisequence MR imaging of the lumbar spine was performed. No intravenous contrast was administered. COMPARISON:  No comparison MR. 04/15/2007 CT abdomen and pelvis. FINDINGS: Last fully open disk space is labeled L5-S1. Present examination incorporates from T11 through upper S3 level. Conus upper L1 level. Remote Schmorl's node deformity L4 superior endplate. Questioned left T11 1 cm nonspecific osseous lesion. It is possible this is related to the rib articulation as versus primary osseous lesion. No other worrisome bony lesions or history of cancer to suggest this is a significant finding. Minimal bulge T10-11 level suspected. T11-12 and T12-L1 unremarkable. L1-2:  Minimal retrolisthesis L1.  Minimal bulge. L2-3: Disc  degeneration with mild adjacent endplate reactive changes. Bulge. No significant spinal stenosis or foraminal narrowing. L3-4: Minimal to mild bulge. No significant spinal stenosis or foraminal narrowing. L4-5: Moderate facet degenerative changes. Bulge. Very mild foraminal narrowing without nerve root compression. No significant spinal stenosis. L5-S1:  Mild facet degenerative changes. IMPRESSION: Degenerative changes throughout the lumbar spine most notable L4-5 level however, no significant spinal stenosis, foraminal narrowing or nerve root compression detected. Please see above for further detail. Electronically Signed   By: Genia Del M.D.   On: 02/15/2016 21:35    Assessment & Plan:   Lief was seen today for annual exam, hypertension, hypothyroidism and hyperlipidemia.  Diagnoses and all orders for this visit:  Essential hypertension- He has not achieved his blood pressure goal of 130/80.  Will continue the current dose of the ARB.  I recommended that he improve his lifestyle modifications. -     CBC with Differential/Platelet; Future -     Basic metabolic panel; Future -     TSH; Future -     EKG 12-Lead  Routine general medical examination at a health care facility- Exam completed, labs reviewed, vaccines reviewed - He deferred on a flu vaccine, cancer screenings are up-to-date, patient education was given. -     Lipid panel; Future -     PSA; Future  Vitamin D deficiency disease- His vitamin D level is normal now. -     VITAMIN D 25 Hydroxy (Vit-D Deficiency, Fractures); Future  Hyperlipidemia with target LDL less than 130- His ASCVD risk or is less than 10% so I did not recommend a statin for CV risk reduction. -     TSH; Future -     Hepatic function panel; Future  Acquired hypothyroidism- His TSH is in the normal range.  He will remain on the current dose of levothyroxine. -     TSH; Future  DDD (degenerative disc disease), lumbar -     oxyCODONE-acetaminophen (PERCOCET)  10-325 MG tablet; Take 1 tablet by mouth every 8 (eight) hours as needed. for pain  Chronic low back pain without sciatica, unspecified back pain laterality- He continues to complain of low back pain but is neurologically intact.  I have encouraged him to stay on the current dose of oxycodone/acetaminophen at 3 times a day. -     oxyCODONE-acetaminophen (PERCOCET) 10-325 MG tablet; Take 1 tablet by mouth every 8 (eight) hours as needed. for pain  Pure hyperglyceridemia- His triglycerides remain elevated.  Will restart the omega-3 fish oil supplement and I encouraged him to improve his lifestyle modifications. -     omega-3 acid ethyl esters (LOVAZA) 1 g capsule; Take 2 capsules (2 g total) by mouth 2 (two) times daily.   I have changed Shirl L. Crosson's omega-3 acid ethyl esters. I am also having him maintain his aspirin, fluticasone, levocetirizine, Cholecalciferol, levothyroxine, clonazePAM, irbesartan, DULoxetine, Multiple Vitamins-Minerals (ZINC PO), Turmeric (QC TUMERIC COMPLEX PO), Ascorbic Acid (VITAMIN C PO), and oxyCODONE-acetaminophen.  Meds ordered this encounter  Medications  . oxyCODONE-acetaminophen (PERCOCET) 10-325 MG tablet    Sig: Take 1 tablet by mouth every 8 (eight) hours as needed. for pain    Dispense:  90 tablet    Refill:  0  . omega-3 acid ethyl esters (LOVAZA) 1 g capsule    Sig: Take 2 capsules (2 g total) by mouth 2 (two) times daily.    Dispense:  360 capsule    Refill:  1   In addition to time spent on CPE, I spent 50 minutes in preparing to see the patient by review of recent labs, imaging and procedures, obtaining and reviewing separately obtained history, communicating with the patient and family or caregiver, ordering medications, tests or procedures, and documenting clinical information in the EHR including the differential Dx, treatment, and any further evaluation and other management of 1. Essential hypertension 2. Vitamin D deficiency disease 3.  Hyperlipidemia with target LDL less than 130 4. Acquired hypothyroidism 5. DDD (degenerative disc disease), lumbar 6. Chronic low back pain without sciatica, unspecified back pain laterality 7. Pure hyperglyceridemia     Follow-up: Return in about 6 months (around 02/06/2021).  Scarlette Calico, MD

## 2020-08-09 LAB — CBC WITH DIFFERENTIAL/PLATELET
Basophils Absolute: 0.1 10*3/uL (ref 0.0–0.1)
Basophils Relative: 1.3 % (ref 0.0–3.0)
Eosinophils Absolute: 0.2 10*3/uL (ref 0.0–0.7)
Eosinophils Relative: 2.2 % (ref 0.0–5.0)
HCT: 43.2 % (ref 39.0–52.0)
Hemoglobin: 14.9 g/dL (ref 13.0–17.0)
Lymphocytes Relative: 37.9 % (ref 12.0–46.0)
Lymphs Abs: 3.5 10*3/uL (ref 0.7–4.0)
MCHC: 34.4 g/dL (ref 30.0–36.0)
MCV: 93 fl (ref 78.0–100.0)
Monocytes Absolute: 0.9 10*3/uL (ref 0.1–1.0)
Monocytes Relative: 10 % (ref 3.0–12.0)
Neutro Abs: 4.5 10*3/uL (ref 1.4–7.7)
Neutrophils Relative %: 48.6 % (ref 43.0–77.0)
Platelets: 310 10*3/uL (ref 150.0–400.0)
RBC: 4.65 Mil/uL (ref 4.22–5.81)
RDW: 12.9 % (ref 11.5–15.5)
WBC: 9.2 10*3/uL (ref 4.0–10.5)

## 2020-08-09 LAB — HEPATIC FUNCTION PANEL
ALT: 44 U/L (ref 0–53)
AST: 25 U/L (ref 0–37)
Albumin: 4.7 g/dL (ref 3.5–5.2)
Alkaline Phosphatase: 56 U/L (ref 39–117)
Bilirubin, Direct: 0.1 mg/dL (ref 0.0–0.3)
Total Bilirubin: 0.4 mg/dL (ref 0.2–1.2)
Total Protein: 7 g/dL (ref 6.0–8.3)

## 2020-08-09 LAB — LIPID PANEL
Cholesterol: 246 mg/dL — ABNORMAL HIGH (ref 0–200)
HDL: 41.9 mg/dL (ref >39.00)
NonHDL: 204.35
Total CHOL/HDL Ratio: 6
Triglycerides: 340 mg/dL — ABNORMAL HIGH (ref 0.0–149.0)
VLDL: 68 mg/dL — ABNORMAL HIGH (ref 0.0–40.0)

## 2020-08-09 LAB — BASIC METABOLIC PANEL
BUN: 14 mg/dL (ref 6–23)
CO2: 23 mEq/L (ref 19–32)
Calcium: 9.7 mg/dL (ref 8.4–10.5)
Chloride: 101 mEq/L (ref 96–112)
Creatinine, Ser: 0.9 mg/dL (ref 0.40–1.50)
GFR: 96.97 mL/min (ref >60.00)
Glucose, Bld: 93 mg/dL (ref 70–99)
Potassium: 4.1 mEq/L (ref 3.5–5.1)
Sodium: 137 mEq/L (ref 135–145)

## 2020-08-09 LAB — VITAMIN D 25 HYDROXY (VIT D DEFICIENCY, FRACTURES): VITD: 43.36 ng/mL (ref 30.00–100.00)

## 2020-08-09 LAB — LDL CHOLESTEROL, DIRECT: Direct LDL: 167 mg/dL

## 2020-08-09 MED ORDER — OMEGA-3-ACID ETHYL ESTERS 1 G PO CAPS: 2.0000 | ORAL_CAPSULE | Freq: Two times a day (BID) | ORAL | 1 refills | Status: DC

## 2020-08-15 ENCOUNTER — Other Ambulatory Visit: Payer: Self-pay | Admitting: Internal Medicine

## 2020-08-15 DIAGNOSIS — E039 Hypothyroidism, unspecified: Secondary | ICD-10-CM

## 2020-08-31 DIAGNOSIS — Z20828 Contact with and (suspected) exposure to other viral communicable diseases: Secondary | ICD-10-CM | POA: Diagnosis not present

## 2020-08-31 DIAGNOSIS — R059 Cough, unspecified: Secondary | ICD-10-CM | POA: Diagnosis not present

## 2020-09-01 ENCOUNTER — Encounter: Payer: Self-pay | Admitting: Internal Medicine

## 2020-09-01 DIAGNOSIS — Z20828 Contact with and (suspected) exposure to other viral communicable diseases: Secondary | ICD-10-CM | POA: Diagnosis not present

## 2020-09-01 DIAGNOSIS — R059 Cough, unspecified: Secondary | ICD-10-CM | POA: Diagnosis not present

## 2020-09-03 NOTE — Telephone Encounter (Signed)
Approved. Effective from 09/03/2020 through 10/02/2020.

## 2020-09-03 NOTE — Telephone Encounter (Signed)
Key: [id number])

## 2020-09-07 ENCOUNTER — Other Ambulatory Visit: Payer: Self-pay | Admitting: Internal Medicine

## 2020-09-07 DIAGNOSIS — M5136 Other intervertebral disc degeneration, lumbar region: Secondary | ICD-10-CM

## 2020-09-07 DIAGNOSIS — G8929 Other chronic pain: Secondary | ICD-10-CM

## 2020-09-07 DIAGNOSIS — M545 Low back pain, unspecified: Secondary | ICD-10-CM

## 2020-09-10 ENCOUNTER — Other Ambulatory Visit: Payer: Self-pay | Admitting: Internal Medicine

## 2020-09-10 DIAGNOSIS — G8929 Other chronic pain: Secondary | ICD-10-CM

## 2020-09-10 DIAGNOSIS — M5136 Other intervertebral disc degeneration, lumbar region: Secondary | ICD-10-CM

## 2020-09-10 DIAGNOSIS — M545 Low back pain, unspecified: Secondary | ICD-10-CM

## 2020-09-10 MED ORDER — OXYCODONE-ACETAMINOPHEN 10-325 MG PO TABS: 1.0000 | ORAL_TABLET | Freq: Three times a day (TID) | ORAL | 0 refills | Status: DC | PRN

## 2020-10-01 ENCOUNTER — Other Ambulatory Visit: Payer: Self-pay | Admitting: Internal Medicine

## 2020-10-01 ENCOUNTER — Encounter: Payer: Self-pay | Admitting: Internal Medicine

## 2020-10-01 DIAGNOSIS — F411 Generalized anxiety disorder: Secondary | ICD-10-CM

## 2020-10-01 DIAGNOSIS — F410 Panic disorder [episodic paroxysmal anxiety] without agoraphobia: Secondary | ICD-10-CM

## 2020-10-01 MED ORDER — CLONAZEPAM 1 MG PO TABS: 1.0000 mg | ORAL_TABLET | Freq: Three times a day (TID) | ORAL | 3 refills | Status: DC | PRN

## 2020-10-02 NOTE — Telephone Encounter (Signed)
Approved  Effective from 10/02/2020 through 10/31/2020.

## 2020-10-08 ENCOUNTER — Other Ambulatory Visit: Payer: Self-pay | Admitting: Internal Medicine

## 2020-10-08 DIAGNOSIS — M5136 Other intervertebral disc degeneration, lumbar region: Secondary | ICD-10-CM

## 2020-10-08 DIAGNOSIS — M545 Low back pain, unspecified: Secondary | ICD-10-CM

## 2020-10-15 DIAGNOSIS — Z20828 Contact with and (suspected) exposure to other viral communicable diseases: Secondary | ICD-10-CM | POA: Diagnosis not present

## 2020-10-16 DIAGNOSIS — Z20828 Contact with and (suspected) exposure to other viral communicable diseases: Secondary | ICD-10-CM | POA: Diagnosis not present

## 2020-10-16 DIAGNOSIS — U071 COVID-19: Secondary | ICD-10-CM | POA: Diagnosis not present

## 2020-11-02 ENCOUNTER — Encounter: Payer: Self-pay | Admitting: Internal Medicine

## 2020-11-02 NOTE — Telephone Encounter (Signed)
Key: [id number]

## 2020-11-04 ENCOUNTER — Other Ambulatory Visit: Payer: Self-pay | Admitting: Internal Medicine

## 2020-11-06 ENCOUNTER — Other Ambulatory Visit: Payer: Self-pay | Admitting: Internal Medicine

## 2020-11-06 DIAGNOSIS — M545 Low back pain, unspecified: Secondary | ICD-10-CM

## 2020-11-06 DIAGNOSIS — M5136 Other intervertebral disc degeneration, lumbar region: Secondary | ICD-10-CM

## 2020-11-06 DIAGNOSIS — G8929 Other chronic pain: Secondary | ICD-10-CM

## 2020-11-06 MED ORDER — OXYCODONE-ACETAMINOPHEN 10-325 MG PO TABS: 1.0000 | ORAL_TABLET | Freq: Three times a day (TID) | ORAL | 0 refills | Status: DC | PRN

## 2020-12-03 ENCOUNTER — Encounter: Payer: Self-pay | Admitting: Internal Medicine

## 2020-12-07 ENCOUNTER — Other Ambulatory Visit: Payer: Self-pay | Admitting: Internal Medicine

## 2020-12-07 DIAGNOSIS — M5136 Other intervertebral disc degeneration, lumbar region: Secondary | ICD-10-CM

## 2020-12-07 DIAGNOSIS — M545 Low back pain, unspecified: Secondary | ICD-10-CM

## 2020-12-09 ENCOUNTER — Other Ambulatory Visit: Payer: Self-pay | Admitting: Internal Medicine

## 2020-12-09 DIAGNOSIS — G8929 Other chronic pain: Secondary | ICD-10-CM

## 2020-12-09 DIAGNOSIS — M5136 Other intervertebral disc degeneration, lumbar region: Secondary | ICD-10-CM

## 2020-12-09 DIAGNOSIS — M545 Low back pain, unspecified: Secondary | ICD-10-CM

## 2020-12-09 MED ORDER — OXYCODONE-ACETAMINOPHEN 10-325 MG PO TABS: 1.0000 | ORAL_TABLET | Freq: Three times a day (TID) | ORAL | 0 refills | Status: DC | PRN

## 2020-12-31 ENCOUNTER — Encounter: Payer: Self-pay | Admitting: Internal Medicine

## 2020-12-31 NOTE — Telephone Encounter (Signed)
Key: [id number]  Approved Effective from 12/31/2020 through 01/29/2021.

## 2021-01-03 ENCOUNTER — Other Ambulatory Visit: Payer: Self-pay | Admitting: Internal Medicine

## 2021-01-03 DIAGNOSIS — M5136 Other intervertebral disc degeneration, lumbar region: Secondary | ICD-10-CM

## 2021-01-03 DIAGNOSIS — G8929 Other chronic pain: Secondary | ICD-10-CM

## 2021-01-03 DIAGNOSIS — M545 Low back pain, unspecified: Secondary | ICD-10-CM

## 2021-01-05 MED ORDER — OXYCODONE-ACETAMINOPHEN 10-325 MG PO TABS: 1.0000 | ORAL_TABLET | Freq: Three times a day (TID) | ORAL | 0 refills | Status: DC | PRN

## 2021-01-31 ENCOUNTER — Encounter: Payer: Self-pay | Admitting: Internal Medicine

## 2021-01-31 NOTE — Telephone Encounter (Signed)
Key: [id number]  Approved 01/31/2021 through 03/01/2021.

## 2021-02-04 ENCOUNTER — Ambulatory Visit: Payer: BC Managed Care – PPO | Admitting: Internal Medicine

## 2021-02-04 ENCOUNTER — Encounter: Payer: Self-pay | Admitting: Internal Medicine

## 2021-02-04 ENCOUNTER — Other Ambulatory Visit: Payer: Self-pay

## 2021-02-04 ENCOUNTER — Other Ambulatory Visit: Payer: Self-pay | Admitting: Internal Medicine

## 2021-02-04 VITALS — BP 154/82 | HR 96 | Temp 98.0°F | Resp 16 | Ht 68.0 in | Wt 230.0 lb

## 2021-02-04 DIAGNOSIS — E781 Pure hyperglyceridemia: Secondary | ICD-10-CM

## 2021-02-04 DIAGNOSIS — I10 Essential (primary) hypertension: Secondary | ICD-10-CM | POA: Diagnosis not present

## 2021-02-04 DIAGNOSIS — M5136 Other intervertebral disc degeneration, lumbar region: Secondary | ICD-10-CM

## 2021-02-04 DIAGNOSIS — M545 Low back pain, unspecified: Secondary | ICD-10-CM

## 2021-02-04 DIAGNOSIS — G8929 Other chronic pain: Secondary | ICD-10-CM

## 2021-02-04 DIAGNOSIS — E039 Hypothyroidism, unspecified: Secondary | ICD-10-CM | POA: Diagnosis not present

## 2021-02-04 DIAGNOSIS — Z79891 Long term (current) use of opiate analgesic: Secondary | ICD-10-CM

## 2021-02-04 MED ORDER — INDAPAMIDE 1.25 MG PO TABS: 1.2500 mg | ORAL_TABLET | Freq: Every day | ORAL | 1 refills | Status: DC

## 2021-02-04 MED ORDER — IRBESARTAN 300 MG PO TABS: 300.0000 mg | ORAL_TABLET | Freq: Every day | ORAL | 1 refills | Status: DC

## 2021-02-04 MED ORDER — OXYCODONE-ACETAMINOPHEN 10-325 MG PO TABS: 1.0000 | ORAL_TABLET | Freq: Three times a day (TID) | ORAL | 0 refills | Status: DC | PRN

## 2021-02-04 NOTE — Progress Notes (Signed)
Subjective:  Patient ID: Jonathan Luna, male    DOB: 05-21-1966  Age: 55 y.o. MRN: [id number]  CC: Hypertension, Hypothyroidism, and Back Pain  This visit occurred during the SARS-CoV-2 public health emergency.  Safety protocols were in place, including screening questions prior to the visit, additional usage of staff PPE, and extensive cleaning of exam room while observing appropriate contact time as indicated for disinfecting solutions.    HPI Jonathan Luna presents for f/up -   He continues to c/o LBP and requests a refill of percocet - there have not been any recent changes in the severity or characteristics of the LBP. He does not monitor his BP but admits that he has not been exercising. He denies HA, BV, CP, DOE, fatigue, or LE edema.   Outpatient Medications Prior to Visit  Medication Sig Dispense Refill  . Ascorbic Acid (VITAMIN C PO) Take by mouth.    Marland Kitchen aspirin 81 MG tablet Take 81 mg by mouth daily.    . Cholecalciferol 50 MCG (2000 UT) TABS Take 2 tablets (4,000 Units total) by mouth daily. 180 tablet 1  . clonazePAM (KLONOPIN) 1 MG tablet Take 1 tablet (1 mg total) by mouth 3 (three) times daily as needed. for anxiety 90 tablet 3  . fluticasone (FLONASE) 50 MCG/ACT nasal spray Place 2 sprays into both nostrils daily. 48 g 1  . levocetirizine (XYZAL) 5 MG tablet Take 1 tablet (5 mg total) by mouth every evening. 90 tablet 1  . Multiple Vitamins-Minerals (ZINC PO) Take by mouth.    . omega-3 acid ethyl esters (LOVAZA) 1 g capsule Take 2 capsules (2 g total) by mouth 2 (two) times daily. 360 capsule 1  . Turmeric (QC TUMERIC COMPLEX PO) Take by mouth.    . DULoxetine (CYMBALTA) 30 MG capsule Take 1 capsule (30 mg total) by mouth daily. 30 capsule 3  . irbesartan (AVAPRO) 300 MG tablet Take 1 tablet (300 mg total) by mouth daily. 90 tablet 1  . levothyroxine (SYNTHROID) 150 MCG tablet TAKE ONE TABLET BY MOUTH ONE TIME DAILY  before breakfast 90 tablet 1  . oxyCODONE-acetaminophen  (PERCOCET) 10-325 MG tablet Take 1 tablet by mouth every 8 (eight) hours as needed. for pain 90 tablet 0   No facility-administered medications prior to visit.    ROS Review of Systems  Constitutional: Positive for unexpected weight change (wt gain). Negative for appetite change, chills, diaphoresis and fatigue.  HENT: Negative.   Eyes: Negative for visual disturbance.  Respiratory: Negative for cough, chest tightness, shortness of breath and wheezing.   Cardiovascular: Negative for chest pain, palpitations and leg swelling.  Gastrointestinal: Negative for abdominal pain, constipation, diarrhea, nausea and vomiting.  Endocrine: Negative.  Negative for cold intolerance and heat intolerance.  Genitourinary: Negative.  Negative for difficulty urinating.  Musculoskeletal: Positive for back pain. Negative for arthralgias and myalgias.  Skin: Negative.   Neurological: Negative.  Negative for dizziness, weakness and headaches.  Hematological: Negative for adenopathy. Does not bruise/bleed easily.  Psychiatric/Behavioral: Negative.     Objective:  BP (!) 154/82 (BP Location: Right Arm, Patient Position: Sitting, Cuff Size: Large)   Pulse 96   Temp 98 F (36.7 C) (Oral)   Resp 16   Ht 5\' 8"  (1.727 m)   Wt 230 lb (104.3 kg)   SpO2 96%   BMI 34.97 kg/m   BP Readings from Last 3 Encounters:  02/04/21 (!) 154/82  08/08/20 136/86  05/10/20 (!) 138/90  Wt Readings from Last 3 Encounters:  02/04/21 230 lb (104.3 kg)  08/08/20 220 lb (99.8 kg)  05/10/20 (!) 222 lb (100.7 kg)    Physical Exam Vitals reviewed.  Constitutional:      Appearance: Normal appearance. He is not ill-appearing.  HENT:     Nose: Nose normal.     Mouth/Throat:     Mouth: Mucous membranes are moist.  Eyes:     General: No scleral icterus.    Conjunctiva/sclera: Conjunctivae normal.  Cardiovascular:     Rate and Rhythm: Normal rate and regular rhythm.     Heart sounds: No murmur heard.   Pulmonary:      Effort: Pulmonary effort is normal.     Breath sounds: No stridor. No wheezing, rhonchi or rales.  Abdominal:     General: Abdomen is protuberant. Bowel sounds are normal. There is no distension.     Palpations: Abdomen is soft. There is no hepatomegaly, splenomegaly or mass.  Musculoskeletal:        General: Normal range of motion.     Cervical back: Normal and neck supple.     Thoracic back: Normal.     Lumbar back: Normal. No tenderness or bony tenderness. Normal range of motion. Negative right straight leg raise test and negative left straight leg raise test.     Right lower leg: Edema (trace pitting) present.     Left lower leg: Edema (trace pitting) present.  Lymphadenopathy:     Cervical: No cervical adenopathy.  Skin:    General: Skin is warm and dry.     Findings: No rash.  Neurological:     General: No focal deficit present.     Mental Status: He is alert.     Cranial Nerves: Cranial nerves are intact.     Sensory: Sensation is intact.     Motor: Motor function is intact.     Coordination: Coordination is intact.     Deep Tendon Reflexes: Reflexes normal.  Psychiatric:        Mood and Affect: Mood normal.        Behavior: Behavior normal.     Lab Results  Component Value Date   WBC 9.2 08/08/2020   HGB 14.9 08/08/2020   HCT 43.2 08/08/2020   PLT 310.0 08/08/2020   GLUCOSE 85 02/04/2021   CHOL 246 (H) 08/08/2020   TRIG 340.0 (H) 08/08/2020   HDL 41.90 08/08/2020   LDLDIRECT 167.0 08/08/2020   LDLCALC 106 (H) 04/07/2017   ALT 44 08/08/2020   AST 25 08/08/2020   NA 137 02/04/2021   K 4.2 02/04/2021   CL 102 02/04/2021   CREATININE 1.09 02/04/2021   BUN 17 02/04/2021   CO2 25 02/04/2021   TSH 0.21 (L) 02/04/2021   PSA 1.19 08/08/2020   HGBA1C 5.2 07/28/2012    [name]  Result Date: 02/15/2016 CLINICAL DATA:  55 year old male with chronic symptoms getting worse. Bilateral lower back pain restricting motion. Burning and anterior  left high. Initial encounter. EXAM: MRI LUMBAR SPINE WITHOUT CONTRAST TECHNIQUE: Multiplanar, multisequence MR imaging of the lumbar spine was performed. No intravenous contrast was administered. COMPARISON:  No comparison MR. 04/15/2007 CT abdomen and pelvis. FINDINGS: Last fully open disk space is labeled L5-S1. Present examination incorporates from T11 through upper S3 level. Conus upper L1 level. Remote Schmorl's node deformity L4 superior endplate. Questioned left T11 1 cm nonspecific osseous lesion. It is possible this is related to the rib articulation as  versus primary osseous lesion. No other worrisome bony lesions or history of cancer to suggest this is a significant finding. Minimal bulge T10-11 level suspected. T11-12 and T12-L1 unremarkable. L1-2:  Minimal retrolisthesis L1.  Minimal bulge. L2-3: Disc degeneration with mild adjacent endplate reactive changes. Bulge. No significant spinal stenosis or foraminal narrowing. L3-4: Minimal to mild bulge. No significant spinal stenosis or foraminal narrowing. L4-5: Moderate facet degenerative changes. Bulge. Very mild foraminal narrowing without nerve root compression. No significant spinal stenosis. L5-S1:  Mild facet degenerative changes. IMPRESSION: Degenerative changes throughout the lumbar spine most notable L4-5 level however, no significant spinal stenosis, foraminal narrowing or nerve root compression detected. Please see above for further detail. Electronically Signed   By: Genia Del M.D.   On: 02/15/2016 21:35    Assessment & Plan:   Zaxton was seen today for hypertension, hypothyroidism and back pain.  Diagnoses and all orders for this visit:  Long-term current use of opiate analgesic- I will monitor for compliance and will screen for substance abuse. -     Urine drugs of abuse scrn w alc, routine (Ref Lab); Future -     Oxycodone/Oxymorphone, Urine; Future -     Oxycodone/Oxymorphone, Urine -     Urine drugs of abuse scrn w alc,  routine (Ref Lab)  Essential hypertension- His blood pressure is not adequately well controlled.  He agrees to improve his lifestyle modifications.  Will add indapamide to the ARB. -     indapamide (LOZOL) 1.25 MG tablet; Take 1 tablet (1.25 mg total) by mouth daily. -     irbesartan (AVAPRO) 300 MG tablet; Take 1 tablet (300 mg total) by mouth daily. -     Basic metabolic panel; Future -     Basic metabolic panel  Acquired hypothyroidism- His TSH is suppressed down to 0.21.  I recommended that he decrease the dose of levothyroxine. -     TSH; Future -     TSH -     levothyroxine (SYNTHROID) 125 MCG tablet; Take 1 tablet (125 mcg total) by mouth daily.  Pure hyperglyceridemia  DDD (degenerative disc disease), lumbar -     oxyCODONE-acetaminophen (PERCOCET) 10-325 MG tablet; Take 1 tablet by mouth every 8 (eight) hours as needed. for pain  Chronic low back pain without sciatica, unspecified back pain laterality -     oxyCODONE-acetaminophen (PERCOCET) 10-325 MG tablet; Take 1 tablet by mouth every 8 (eight) hours as needed. for pain   I have discontinued [facility]'s DULoxetine and levothyroxine. I am also having him start on indapamide and levothyroxine. Additionally, I am having him maintain his aspirin, fluticasone, levocetirizine, Cholecalciferol, Multiple Vitamins-Minerals (ZINC PO), Turmeric (QC TUMERIC COMPLEX PO), Ascorbic Acid (VITAMIN C PO), omega-3 acid ethyl esters, clonazePAM, irbesartan, and oxyCODONE-acetaminophen.  Meds ordered this encounter  Medications  . indapamide (LOZOL) 1.25 MG tablet    Sig: Take 1 tablet (1.25 mg total) by mouth daily.    Dispense:  90 tablet    Refill:  1  . irbesartan (AVAPRO) 300 MG tablet    Sig: Take 1 tablet (300 mg total) by mouth daily.    Dispense:  90 tablet    Refill:  1  . oxyCODONE-acetaminophen (PERCOCET) 10-325 MG tablet    Sig: Take 1 tablet by mouth every 8 (eight) hours as needed. for pain    Dispense:  90 tablet     Refill:  0  . levothyroxine (SYNTHROID) 125 MCG tablet    Sig: Take  1 tablet (125 mcg total) by mouth daily.    Dispense:  90 tablet    Refill:  0     Follow-up: Return in about 6 months (around 08/06/2021).  Scarlette Calico, MD

## 2021-02-04 NOTE — Patient Instructions (Signed)

## 2021-02-05 LAB — TSH: TSH: 0.21 u[IU]/mL — ABNORMAL LOW (ref 0.35–4.50)

## 2021-02-05 LAB — BASIC METABOLIC PANEL
BUN: 17 mg/dL (ref 6–23)
CO2: 25 mEq/L (ref 19–32)
Calcium: 9.6 mg/dL (ref 8.4–10.5)
Chloride: 102 mEq/L (ref 96–112)
Creatinine, Ser: 1.09 mg/dL (ref 0.40–1.50)
GFR: 76.79 mL/min (ref >60.00)
Glucose, Bld: 85 mg/dL (ref 70–99)
Potassium: 4.2 mEq/L (ref 3.5–5.1)
Sodium: 137 mEq/L (ref 135–145)

## 2021-02-05 MED ORDER — LEVOTHYROXINE SODIUM 125 MCG PO TABS: 125.0000 ug | ORAL_TABLET | Freq: Every day | ORAL | 0 refills | Status: DC

## 2021-02-08 ENCOUNTER — Other Ambulatory Visit: Payer: Self-pay | Admitting: Internal Medicine

## 2021-02-08 DIAGNOSIS — F410 Panic disorder [episodic paroxysmal anxiety] without agoraphobia: Secondary | ICD-10-CM

## 2021-02-08 DIAGNOSIS — F411 Generalized anxiety disorder: Secondary | ICD-10-CM

## 2021-02-10 LAB — OXYCODONE/OXYMORPHONE, URINE: Oxycodone+Oxymorphone Ur Ql Scn: POSITIVE ng/mL — AB

## 2021-02-10 LAB — URINE DRUGS OF ABUSE SCREEN W ALC, ROUTINE (REF LAB)
Amphetamines, Urine: NEGATIVE ng/mL
Barbiturate Quant, Ur: NEGATIVE ng/mL
Benzodiazepine Quant, Ur: NEGATIVE ng/mL
Cannabinoid Quant, Ur: NEGATIVE ng/mL
Cocaine (Metab.): NEGATIVE ng/mL
Ethanol, Urine: NEGATIVE %
Methadone Screen, Urine: NEGATIVE ng/mL
PCP Quant, Ur: NEGATIVE ng/mL
Propoxyphene: NEGATIVE ng/mL

## 2021-02-10 LAB — OPIATES CONFIRMATION, URINE: OPIATES: NEGATIVE

## 2021-02-11 ENCOUNTER — Encounter: Payer: Self-pay | Admitting: Internal Medicine

## 2021-03-01 ENCOUNTER — Encounter: Payer: Self-pay | Admitting: Internal Medicine

## 2021-03-04 NOTE — Telephone Encounter (Addendum)
Key: [id number]  Approved Effective from 03/04/2021 through 04/02/2021.

## 2021-03-05 ENCOUNTER — Other Ambulatory Visit: Payer: Self-pay | Admitting: Internal Medicine

## 2021-03-05 DIAGNOSIS — M5136 Other intervertebral disc degeneration, lumbar region: Secondary | ICD-10-CM

## 2021-03-05 DIAGNOSIS — G8929 Other chronic pain: Secondary | ICD-10-CM

## 2021-03-05 DIAGNOSIS — M545 Low back pain, unspecified: Secondary | ICD-10-CM

## 2021-03-05 MED ORDER — OXYCODONE-ACETAMINOPHEN 10-325 MG PO TABS: 1.0000 | ORAL_TABLET | Freq: Three times a day (TID) | ORAL | 0 refills | Status: DC | PRN

## 2021-04-02 ENCOUNTER — Other Ambulatory Visit: Payer: Self-pay | Admitting: Internal Medicine

## 2021-04-02 ENCOUNTER — Encounter: Payer: Self-pay | Admitting: Internal Medicine

## 2021-04-02 DIAGNOSIS — M5136 Other intervertebral disc degeneration, lumbar region: Secondary | ICD-10-CM

## 2021-04-02 DIAGNOSIS — M545 Low back pain, unspecified: Secondary | ICD-10-CM

## 2021-04-02 DIAGNOSIS — G8929 Other chronic pain: Secondary | ICD-10-CM

## 2021-04-02 NOTE — Telephone Encounter (Signed)
Key: [id number]

## 2021-04-05 ENCOUNTER — Other Ambulatory Visit: Payer: Self-pay | Admitting: Internal Medicine

## 2021-04-05 DIAGNOSIS — M5136 Other intervertebral disc degeneration, lumbar region: Secondary | ICD-10-CM

## 2021-04-05 DIAGNOSIS — G8929 Other chronic pain: Secondary | ICD-10-CM

## 2021-04-05 DIAGNOSIS — M545 Low back pain, unspecified: Secondary | ICD-10-CM

## 2021-04-05 MED ORDER — OXYCODONE-ACETAMINOPHEN 10-325 MG PO TABS: 1.0000 | ORAL_TABLET | Freq: Three times a day (TID) | ORAL | 0 refills | Status: DC | PRN

## 2021-05-02 ENCOUNTER — Other Ambulatory Visit: Payer: Self-pay | Admitting: Internal Medicine

## 2021-05-02 ENCOUNTER — Encounter: Payer: Self-pay | Admitting: Internal Medicine

## 2021-05-02 DIAGNOSIS — M5136 Other intervertebral disc degeneration, lumbar region: Secondary | ICD-10-CM

## 2021-05-02 DIAGNOSIS — M545 Low back pain, unspecified: Secondary | ICD-10-CM

## 2021-05-02 DIAGNOSIS — G8929 Other chronic pain: Secondary | ICD-10-CM

## 2021-05-02 NOTE — Telephone Encounter (Signed)
Key: [facility]

## 2021-05-03 MED ORDER — OXYCODONE-ACETAMINOPHEN 10-325 MG PO TABS: 1.0000 | ORAL_TABLET | Freq: Three times a day (TID) | ORAL | 0 refills | Status: DC | PRN

## 2021-05-09 ENCOUNTER — Other Ambulatory Visit: Payer: Self-pay | Admitting: Internal Medicine

## 2021-05-09 DIAGNOSIS — F410 Panic disorder [episodic paroxysmal anxiety] without agoraphobia: Secondary | ICD-10-CM

## 2021-05-09 DIAGNOSIS — E039 Hypothyroidism, unspecified: Secondary | ICD-10-CM

## 2021-05-09 DIAGNOSIS — F411 Generalized anxiety disorder: Secondary | ICD-10-CM

## 2021-05-09 DIAGNOSIS — E781 Pure hyperglyceridemia: Secondary | ICD-10-CM

## 2021-05-31 ENCOUNTER — Encounter: Payer: Self-pay | Admitting: Internal Medicine

## 2021-06-04 ENCOUNTER — Other Ambulatory Visit: Payer: Self-pay | Admitting: Internal Medicine

## 2021-06-04 DIAGNOSIS — G8929 Other chronic pain: Secondary | ICD-10-CM

## 2021-06-04 DIAGNOSIS — M545 Low back pain, unspecified: Secondary | ICD-10-CM

## 2021-06-04 DIAGNOSIS — M5136 Other intervertebral disc degeneration, lumbar region: Secondary | ICD-10-CM

## 2021-06-06 ENCOUNTER — Other Ambulatory Visit: Payer: Self-pay | Admitting: Internal Medicine

## 2021-06-06 ENCOUNTER — Telehealth: Payer: Self-pay | Admitting: Internal Medicine

## 2021-06-06 DIAGNOSIS — M545 Low back pain, unspecified: Secondary | ICD-10-CM

## 2021-06-06 DIAGNOSIS — M5136 Other intervertebral disc degeneration, lumbar region: Secondary | ICD-10-CM

## 2021-06-06 DIAGNOSIS — G8929 Other chronic pain: Secondary | ICD-10-CM

## 2021-06-06 NOTE — Telephone Encounter (Signed)
Key: [id number]

## 2021-06-06 NOTE — Telephone Encounter (Signed)
Pt has been informed that OV needed for refill.   He asked if a refill could be granted until his scheduled OV. He stated that he was not aware of follow up's needed for controlled meds. 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 I did mention that I could have another conversation with PCP in regard for the refill but could not guarantee that it would be honored since it was previously denied due to [place] needed. Pt expressed understanding.   Please advise.

## 2021-06-06 NOTE — Telephone Encounter (Signed)
1.Medication Requested:oxyCODONE-acetaminophen (PERCOCET) 10-325 MG tablet  2. Pharmacy (Name, [place], [facility]): [place] [address] [place], [place]  [address] [place] [address]  Phone:  [phone]  Fax:  [phone]    3. On Med List: yes  4. Last Visit with PCP: 04.25.22  5. Next visit date with PCP: 10.25.22   Agent: Please be advised that RX refills may take up to 3 business days. We ask that you follow-up with your pharmacy.

## 2021-06-10 ENCOUNTER — Other Ambulatory Visit: Payer: Self-pay | Admitting: Internal Medicine

## 2021-06-10 DIAGNOSIS — F411 Generalized anxiety disorder: Secondary | ICD-10-CM

## 2021-06-10 DIAGNOSIS — F410 Panic disorder [episodic paroxysmal anxiety] without agoraphobia: Secondary | ICD-10-CM

## 2021-06-11 ENCOUNTER — Encounter: Payer: Self-pay | Admitting: Internal Medicine

## 2021-06-11 ENCOUNTER — Ambulatory Visit: Payer: BC Managed Care – PPO | Admitting: Internal Medicine

## 2021-06-11 ENCOUNTER — Other Ambulatory Visit: Payer: Self-pay

## 2021-06-11 VITALS — BP 150/92 | HR 99 | Temp 98.4°F | Ht 68.0 in | Wt 232.0 lb

## 2021-06-11 DIAGNOSIS — Z79891 Long term (current) use of opiate analgesic: Secondary | ICD-10-CM

## 2021-06-11 DIAGNOSIS — Z23 Encounter for immunization: Secondary | ICD-10-CM

## 2021-06-11 DIAGNOSIS — M545 Low back pain, unspecified: Secondary | ICD-10-CM

## 2021-06-11 DIAGNOSIS — F411 Generalized anxiety disorder: Secondary | ICD-10-CM

## 2021-06-11 DIAGNOSIS — G8929 Other chronic pain: Secondary | ICD-10-CM

## 2021-06-11 DIAGNOSIS — E781 Pure hyperglyceridemia: Secondary | ICD-10-CM | POA: Diagnosis not present

## 2021-06-11 DIAGNOSIS — F410 Panic disorder [episodic paroxysmal anxiety] without agoraphobia: Secondary | ICD-10-CM

## 2021-06-11 DIAGNOSIS — M5136 Other intervertebral disc degeneration, lumbar region: Secondary | ICD-10-CM

## 2021-06-11 DIAGNOSIS — E785 Hyperlipidemia, unspecified: Secondary | ICD-10-CM | POA: Diagnosis not present

## 2021-06-11 DIAGNOSIS — I10 Essential (primary) hypertension: Secondary | ICD-10-CM

## 2021-06-11 DIAGNOSIS — E039 Hypothyroidism, unspecified: Secondary | ICD-10-CM

## 2021-06-11 LAB — LIPID PANEL
Cholesterol: 221 mg/dL — ABNORMAL HIGH (ref 0–200)
HDL: 36.8 mg/dL — ABNORMAL LOW (ref >39.00)
NonHDL: 184.04
Total CHOL/HDL Ratio: 6
Triglycerides: 251 mg/dL — ABNORMAL HIGH (ref 0.0–149.0)
VLDL: 50.2 mg/dL — ABNORMAL HIGH (ref 0.0–40.0)

## 2021-06-11 LAB — TSH: TSH: 1.69 u[IU]/mL (ref 0.35–5.50)

## 2021-06-11 LAB — LDL CHOLESTEROL, DIRECT: Direct LDL: 156 mg/dL

## 2021-06-11 MED ORDER — CLONAZEPAM 1 MG PO TABS: ORAL_TABLET | ORAL | 2 refills | Status: DC

## 2021-06-11 MED ORDER — OXYCODONE-ACETAMINOPHEN 10-325 MG PO TABS: 1.0000 | ORAL_TABLET | Freq: Three times a day (TID) | ORAL | 0 refills | Status: DC | PRN

## 2021-06-11 NOTE — Patient Instructions (Signed)

## 2021-06-11 NOTE — Progress Notes (Signed)
Subjective:  Patient ID: Jonathan Luna, male    DOB: [birth year]  Age: 55 y.o. MRN: [id number]  CC: Hypertension, Hyperlipidemia, and Hypothyroidism  This visit occurred during the SARS-CoV-2 public health emergency.  Safety protocols were in place, including screening questions prior to the visit, additional usage of staff PPE, and extensive cleaning of exam room while observing appropriate contact time as indicated for disinfecting solutions.    HPI Jonathan Luna presents for f/up -  He is not taking indapamide.  He was aware that it was available at the pharmacy but he never picked it up.  He tells me he is taking irbesartan.  He works out at [employer] on an elliptical.  He can go for 30 minutes and does not experience headache, blurred vision, chest pain, shortness of breath, diaphoresis, dizziness, or lightheadedness.  He continues to complain of anxiety and panic and requests a refill on clonazepam.  He tells me his low back pain is adequately well controlled with the current dose of oxycodone and acetaminophen.  There is no back pain that radiates into his extremities and he denies lower extremity paresthesias.  Outpatient Medications Prior to Visit  Medication Sig Dispense Refill   Ascorbic Acid (VITAMIN C PO) Take by mouth.     aspirin 81 MG tablet Take 81 mg by mouth daily.     Cholecalciferol 50 MCG (2000 UT) TABS Take 2 tablets (4,000 Units total) by mouth daily. 180 tablet 1   fluticasone (FLONASE) 50 MCG/ACT nasal spray Place 2 sprays into both nostrils daily. 48 g 1   indapamide (LOZOL) 1.25 MG tablet Take 1 tablet (1.25 mg total) by mouth daily. 90 tablet 1   irbesartan (AVAPRO) 300 MG tablet Take 1 tablet (300 mg total) by mouth daily. 90 tablet 1   levocetirizine (XYZAL) 5 MG tablet Take 1 tablet (5 mg total) by mouth every evening. 90 tablet 1   levothyroxine (SYNTHROID) 125 MCG tablet TAKE ONE TABLET BY MOUTH ONE TIME DAILY 90 tablet 0   Multiple Vitamins-Minerals (ZINC PO) Take by  mouth.     omega-3 acid ethyl esters (LOVAZA) 1 g capsule TAKE TWO CAPSULES BY MOUTH TWICE DAILY 360 capsule 0   Turmeric (QC TUMERIC COMPLEX PO) Take by mouth.     clonazePAM (KLONOPIN) 1 MG tablet TAKE 1 TABLET BY MOUTH 3 TIMES A DAY AS NEEDED FOR ANXIETY 90 tablet 0   oxyCODONE-acetaminophen (PERCOCET) 10-325 MG tablet Take 1 tablet by mouth every 8 (eight) hours as needed. for pain 90 tablet 0   No facility-administered medications prior to visit.    ROS Review of Systems  Constitutional:  Negative for appetite change, diaphoresis, fatigue and unexpected weight change.  HENT: Negative.    Eyes: Negative.   Respiratory:  Negative for cough, chest tightness, shortness of breath and wheezing.   Cardiovascular:  Negative for chest pain, palpitations and leg swelling.  Gastrointestinal:  Negative for abdominal pain, constipation, diarrhea, nausea and vomiting.  Endocrine: Negative for cold intolerance and heat intolerance.  Genitourinary: Negative.  Negative for difficulty urinating.  Musculoskeletal:  Positive for back pain. Negative for myalgias.  Skin: Negative.  Negative for color change.  Neurological: Negative.  Negative for dizziness, weakness, light-headedness and headaches.  Hematological:  Negative for adenopathy. Does not bruise/bleed easily.  Psychiatric/Behavioral:  Negative for behavioral problems, decreased concentration, dysphoric mood, self-injury and suicidal ideas. The patient is nervous/anxious.    Objective:  BP (!) 150/92 (BP Location: Left Arm, Patient  Position: Sitting, Cuff Size: Large)   Pulse 99   Temp 98.4 F (36.9 C) (Oral)   Ht '5\' 8"'$  (1.727 m)   Wt 232 lb (105.2 kg)   SpO2 96%   BMI 35.28 kg/m   BP Readings from Last 3 Encounters:  06/11/21 (!) 150/92  02/04/21 (!) 154/82  08/08/20 136/86    Wt Readings from Last 3 Encounters:  06/11/21 232 lb (105.2 kg)  02/04/21 230 lb (104.3 kg)  08/08/20 220 lb (99.8 kg)    Physical Exam Vitals  reviewed.  HENT:     Mouth/Throat:     Mouth: Mucous membranes are moist.  Eyes:     General: No scleral icterus.    Conjunctiva/sclera: Conjunctivae normal.  Cardiovascular:     Rate and Rhythm: Normal rate and regular rhythm.     Heart sounds: No murmur heard. Pulmonary:     Effort: Pulmonary effort is normal.     Breath sounds: No stridor. No wheezing, rhonchi or rales.  Abdominal:     General: Abdomen is protuberant. Bowel sounds are normal. There is no distension.     Palpations: Abdomen is soft. There is no hepatomegaly, splenomegaly or mass.     Tenderness: There is no abdominal tenderness. There is no guarding.  Musculoskeletal:        General: Normal range of motion.     Cervical back: Normal and neck supple.     Thoracic back: Normal.     Lumbar back: Normal. No bony tenderness. Normal range of motion. Negative right straight leg raise test and negative left straight leg raise test.     Right lower leg: No edema.     Left lower leg: No edema.  Lymphadenopathy:     Cervical: No cervical adenopathy.  Skin:    General: Skin is warm and dry.  Neurological:     General: No focal deficit present.     Mental Status: He is alert. Mental status is at baseline.  Psychiatric:        Attention and Perception: Attention and perception normal.        Mood and Affect: Mood is anxious. Mood is not depressed.        Speech: Speech normal.        Behavior: Behavior normal. Behavior is cooperative.        Thought Content: Thought content normal. Thought content is not paranoid or delusional. Thought content does not include homicidal or suicidal ideation.        Cognition and Memory: Cognition normal.        Judgment: Judgment normal.    Lab Results  Component Value Date   WBC 9.2 08/08/2020   HGB 14.9 08/08/2020   HCT 43.2 08/08/2020   PLT 310.0 08/08/2020   GLUCOSE 85 02/04/2021   CHOL 221 (H) 06/11/2021   TRIG 251.0 (H) 06/11/2021   HDL 36.80 (L) 06/11/2021   LDLDIRECT  156.0 06/11/2021   LDLCALC 106 (H) 04/07/2017   ALT 44 08/08/2020   AST 25 08/08/2020   NA 137 02/04/2021   K 4.2 02/04/2021   CL 102 02/04/2021   CREATININE 1.09 02/04/2021   BUN 17 02/04/2021   CO2 25 02/04/2021   TSH 1.69 06/11/2021   PSA 1.19 08/08/2020   HGBA1C 5.2 07/28/2012    [name]  Result Date: 02/15/2016 CLINICAL DATA:  55 year old male with chronic symptoms getting worse. Bilateral lower back pain restricting motion. Burning and anterior left high. Initial  encounter. EXAM: MRI LUMBAR SPINE WITHOUT CONTRAST TECHNIQUE: Multiplanar, multisequence MR imaging of the lumbar spine was performed. No intravenous contrast was administered. COMPARISON:  No comparison MR. 04/15/2007 CT abdomen and pelvis. FINDINGS: Last fully open disk space is labeled L5-S1. Present examination incorporates from T11 through upper S3 level. Conus upper L1 level. Remote Schmorl's node deformity L4 superior endplate. Questioned left T11 1 cm nonspecific osseous lesion. It is possible this is related to the rib articulation as versus primary osseous lesion. No other worrisome bony lesions or history of cancer to suggest this is a significant finding. Minimal bulge T10-11 level suspected. T11-12 and T12-L1 unremarkable. L1-2:  Minimal retrolisthesis L1.  Minimal bulge. L2-3: Disc degeneration with mild adjacent endplate reactive changes. Bulge. No significant spinal stenosis or foraminal narrowing. L3-4: Minimal to mild bulge. No significant spinal stenosis or foraminal narrowing. L4-5: Moderate facet degenerative changes. Bulge. Very mild foraminal narrowing without nerve root compression. No significant spinal stenosis. L5-S1:  Mild facet degenerative changes. IMPRESSION: Degenerative changes throughout the lumbar spine most notable L4-5 level however, no significant spinal stenosis, foraminal narrowing or nerve root compression detected. Please see above for further detail. Electronically Signed    By: Genia Del M.D.   On: 02/15/2016 21:35    Assessment & Plan:   Jartavious was seen today for hypertension, hyperlipidemia and hypothyroidism.  Diagnoses and all orders for this visit:  Essential hypertension- His blood pressure is not adequately well controlled.  He agrees to add indapamide to his current regimen.  He will also improve his lifestyle modifications. -     TSH; Future -     TSH  Hyperlipidemia with target LDL less than 130- His ASCVD risk score is 12.5%.  Statin therapy is not indicated. -     Lipid panel; Future -     Lipid panel  Pure hyperglyceridemia- He will continue to work on his lifestyle modifications. -     Lipid panel; Future -     Lipid panel  Long-term current use of opiate analgesic- I will monitor for compliance and will screen for substance abuse. -     Oxycodone/Oxymorphone, Urine; Future -     Urine drugs of abuse scrn w alc, routine (Ref Lab); Future -     Urine drugs of abuse scrn w alc, routine (Ref Lab) -     Oxycodone/Oxymorphone, Urine  Acquired hypothyroidism- His TSH is in the normal range.  He will stay on the current dose of levothyroxine. -     TSH; Future -     TSH  GAD (generalized anxiety disorder) -     clonazePAM (KLONOPIN) 1 MG tablet; TAKE 1 TABLET BY MOUTH 3 TIMES A DAY AS NEEDED FOR ANXIETY  PANIC DISORDER -     clonazePAM (KLONOPIN) 1 MG tablet; TAKE 1 TABLET BY MOUTH 3 TIMES A DAY AS NEEDED FOR ANXIETY  DDD (degenerative disc disease), lumbar -     oxyCODONE-acetaminophen (PERCOCET) 10-325 MG tablet; Take 1 tablet by mouth every 8 (eight) hours as needed. for pain  Chronic low back pain without sciatica, unspecified back pain laterality -     oxyCODONE-acetaminophen (PERCOCET) 10-325 MG tablet; Take 1 tablet by mouth every 8 (eight) hours as needed. for pain  Other orders -     Flu Vaccine QUAD 6+ mos PF IM (Fluarix Quad PF) -     LDL cholesterol, direct  I am having Ruffin Frederick maintain his aspirin, fluticasone,  levocetirizine, Cholecalciferol, Multiple Vitamins-Minerals (  ZINC PO), Turmeric (QC TUMERIC COMPLEX PO), Ascorbic Acid (VITAMIN C PO), indapamide, irbesartan, omega-3 acid ethyl esters, levothyroxine, clonazePAM, and oxyCODONE-acetaminophen.  Meds ordered this encounter  Medications   clonazePAM (KLONOPIN) 1 MG tablet    Sig: TAKE 1 TABLET BY MOUTH 3 TIMES A DAY AS NEEDED FOR ANXIETY    Dispense:  90 tablet    Refill:  2   oxyCODONE-acetaminophen (PERCOCET) 10-325 MG tablet    Sig: Take 1 tablet by mouth every 8 (eight) hours as needed. for pain    Dispense:  90 tablet    Refill:  0      Follow-up: Return in about 3 months (around 09/11/2021).  Scarlette Calico, MD

## 2021-06-12 ENCOUNTER — Encounter: Payer: Self-pay | Admitting: Internal Medicine

## 2021-06-12 ENCOUNTER — Other Ambulatory Visit: Payer: Self-pay | Admitting: Internal Medicine

## 2021-06-12 LAB — URINE DRUGS OF ABUSE SCREEN W ALC, ROUTINE (REF LAB)
Amphetamines, Urine: NEGATIVE ng/mL
Barbiturate Quant, Ur: NEGATIVE ng/mL
Benzodiazepine Quant, Ur: NEGATIVE ng/mL
Cannabinoid Quant, Ur: NEGATIVE ng/mL
Cocaine (Metab.): NEGATIVE ng/mL
Ethanol, Urine: NEGATIVE %
Methadone Screen, Urine: NEGATIVE ng/mL
Opiate Quant, Ur: NEGATIVE ng/mL
PCP Quant, Ur: NEGATIVE ng/mL
Propoxyphene: NEGATIVE ng/mL

## 2021-06-12 LAB — OXYCODONE/OXYMORPHONE, URINE: Oxycodone+Oxymorphone Ur Ql Scn: POSITIVE ng/mL — AB

## 2021-07-05 ENCOUNTER — Encounter: Payer: Self-pay | Admitting: Internal Medicine

## 2021-07-05 NOTE — Telephone Encounter (Signed)
Key: [id number]

## 2021-07-09 ENCOUNTER — Other Ambulatory Visit: Payer: Self-pay | Admitting: Internal Medicine

## 2021-07-09 DIAGNOSIS — M545 Low back pain, unspecified: Secondary | ICD-10-CM

## 2021-07-09 DIAGNOSIS — M5136 Other intervertebral disc degeneration, lumbar region: Secondary | ICD-10-CM

## 2021-07-10 MED ORDER — OXYCODONE-ACETAMINOPHEN 10-325 MG PO TABS: 1.0000 | ORAL_TABLET | Freq: Three times a day (TID) | ORAL | 0 refills | Status: DC | PRN

## 2021-08-05 ENCOUNTER — Encounter: Payer: Self-pay | Admitting: Internal Medicine

## 2021-08-05 ENCOUNTER — Other Ambulatory Visit: Payer: Self-pay | Admitting: Internal Medicine

## 2021-08-05 DIAGNOSIS — M545 Low back pain, unspecified: Secondary | ICD-10-CM

## 2021-08-05 DIAGNOSIS — G8929 Other chronic pain: Secondary | ICD-10-CM

## 2021-08-05 DIAGNOSIS — M5136 Other intervertebral disc degeneration, lumbar region: Secondary | ICD-10-CM

## 2021-08-06 ENCOUNTER — Ambulatory Visit: Payer: BC Managed Care – PPO | Admitting: Internal Medicine

## 2021-08-07 MED ORDER — OXYCODONE-ACETAMINOPHEN 10-325 MG PO TABS: 1.0000 | ORAL_TABLET | Freq: Three times a day (TID) | ORAL | 0 refills | Status: DC | PRN

## 2021-08-07 NOTE — Telephone Encounter (Signed)
Key: [id number]

## 2021-08-19 ENCOUNTER — Other Ambulatory Visit: Payer: Self-pay | Admitting: Internal Medicine

## 2021-08-19 DIAGNOSIS — I10 Essential (primary) hypertension: Secondary | ICD-10-CM

## 2021-08-19 DIAGNOSIS — E039 Hypothyroidism, unspecified: Secondary | ICD-10-CM

## 2021-08-20 ENCOUNTER — Other Ambulatory Visit: Payer: Self-pay | Admitting: Internal Medicine

## 2021-08-20 DIAGNOSIS — E781 Pure hyperglyceridemia: Secondary | ICD-10-CM

## 2021-08-20 DIAGNOSIS — I10 Essential (primary) hypertension: Secondary | ICD-10-CM

## 2021-09-05 ENCOUNTER — Encounter: Payer: Self-pay | Admitting: Internal Medicine

## 2021-09-08 ENCOUNTER — Other Ambulatory Visit: Payer: Self-pay | Admitting: Internal Medicine

## 2021-09-08 DIAGNOSIS — M545 Low back pain, unspecified: Secondary | ICD-10-CM

## 2021-09-08 DIAGNOSIS — M5136 Other intervertebral disc degeneration, lumbar region: Secondary | ICD-10-CM

## 2021-09-09 MED ORDER — OXYCODONE-ACETAMINOPHEN 10-325 MG PO TABS: 1.0000 | ORAL_TABLET | Freq: Three times a day (TID) | ORAL | 0 refills | Status: DC | PRN

## 2021-09-09 NOTE — Telephone Encounter (Signed)
Key: [id number]

## 2021-09-20 DIAGNOSIS — H43813 Vitreous degeneration, bilateral: Secondary | ICD-10-CM | POA: Diagnosis not present

## 2021-09-20 DIAGNOSIS — H31011 Macula scars of posterior pole (postinflammatory) (post-traumatic), right eye: Secondary | ICD-10-CM | POA: Diagnosis not present

## 2021-09-20 DIAGNOSIS — H2703 Aphakia, bilateral: Secondary | ICD-10-CM | POA: Diagnosis not present

## 2021-09-20 DIAGNOSIS — H59812 Chorioretinal scars after surgery for detachment, left eye: Secondary | ICD-10-CM | POA: Diagnosis not present

## 2021-09-27 ENCOUNTER — Other Ambulatory Visit: Payer: Self-pay | Admitting: Internal Medicine

## 2021-09-27 DIAGNOSIS — I10 Essential (primary) hypertension: Secondary | ICD-10-CM

## 2021-10-23 ENCOUNTER — Other Ambulatory Visit: Payer: Self-pay

## 2021-10-23 ENCOUNTER — Ambulatory Visit: Payer: BC Managed Care – PPO | Admitting: Internal Medicine

## 2021-10-23 ENCOUNTER — Encounter: Payer: Self-pay | Admitting: Internal Medicine

## 2021-10-23 ENCOUNTER — Other Ambulatory Visit: Payer: Self-pay | Admitting: Internal Medicine

## 2021-10-23 VITALS — BP 128/84 | HR 68 | Temp 97.7°F | Resp 16 | Ht 68.0 in | Wt 216.0 lb

## 2021-10-23 DIAGNOSIS — M5136 Other intervertebral disc degeneration, lumbar region: Secondary | ICD-10-CM | POA: Diagnosis not present

## 2021-10-23 DIAGNOSIS — E785 Hyperlipidemia, unspecified: Secondary | ICD-10-CM

## 2021-10-23 DIAGNOSIS — M545 Low back pain, unspecified: Secondary | ICD-10-CM | POA: Diagnosis not present

## 2021-10-23 DIAGNOSIS — Z79891 Long term (current) use of opiate analgesic: Secondary | ICD-10-CM

## 2021-10-23 DIAGNOSIS — N522 Drug-induced erectile dysfunction: Secondary | ICD-10-CM | POA: Diagnosis not present

## 2021-10-23 DIAGNOSIS — Z0000 Encounter for general adult medical examination without abnormal findings: Secondary | ICD-10-CM

## 2021-10-23 DIAGNOSIS — I10 Essential (primary) hypertension: Secondary | ICD-10-CM

## 2021-10-23 DIAGNOSIS — F411 Generalized anxiety disorder: Secondary | ICD-10-CM

## 2021-10-23 DIAGNOSIS — E039 Hypothyroidism, unspecified: Secondary | ICD-10-CM | POA: Diagnosis not present

## 2021-10-23 DIAGNOSIS — M51369 Other intervertebral disc degeneration, lumbar region without mention of lumbar back pain or lower extremity pain: Secondary | ICD-10-CM

## 2021-10-23 DIAGNOSIS — E291 Testicular hypofunction: Secondary | ICD-10-CM

## 2021-10-23 DIAGNOSIS — G8929 Other chronic pain: Secondary | ICD-10-CM

## 2021-10-23 LAB — LIPID PANEL
Cholesterol: 214 mg/dL — ABNORMAL HIGH (ref 0–200)
HDL: 32.1 mg/dL — ABNORMAL LOW (ref >39.00)
NonHDL: 181.85
Total CHOL/HDL Ratio: 7
Triglycerides: 362 mg/dL — ABNORMAL HIGH (ref 0.0–149.0)
VLDL: 72.4 mg/dL — ABNORMAL HIGH (ref 0.0–40.0)

## 2021-10-23 LAB — HEPATIC FUNCTION PANEL
ALT: 41 U/L (ref 0–53)
AST: 27 U/L (ref 0–37)
Albumin: 4.5 g/dL (ref 3.5–5.2)
Alkaline Phosphatase: 72 U/L (ref 39–117)
Bilirubin, Direct: 0.1 mg/dL (ref 0.0–0.3)
Total Bilirubin: 0.5 mg/dL (ref 0.2–1.2)
Total Protein: 7.3 g/dL (ref 6.0–8.3)

## 2021-10-23 LAB — BASIC METABOLIC PANEL
BUN: 14 mg/dL (ref 6–23)
CO2: 27 mEq/L (ref 19–32)
Calcium: 9.5 mg/dL (ref 8.4–10.5)
Chloride: 98 mEq/L (ref 96–112)
Creatinine, Ser: 0.9 mg/dL (ref 0.40–1.50)
GFR: 96.16 mL/min (ref >60.00)
Glucose, Bld: 92 mg/dL (ref 70–99)
Potassium: 3.9 mEq/L (ref 3.5–5.1)
Sodium: 134 mEq/L — ABNORMAL LOW (ref 135–145)

## 2021-10-23 LAB — CBC WITH DIFFERENTIAL/PLATELET
Basophils Absolute: 0.1 10*3/uL (ref 0.0–0.1)
Basophils Relative: 1.1 % (ref 0.0–3.0)
Eosinophils Absolute: 0.2 10*3/uL (ref 0.0–0.7)
Eosinophils Relative: 2.2 % (ref 0.0–5.0)
HCT: 42.3 % (ref 39.0–52.0)
Hemoglobin: 14.3 g/dL (ref 13.0–17.0)
Lymphocytes Relative: 31.5 % (ref 12.0–46.0)
Lymphs Abs: 3.2 10*3/uL (ref 0.7–4.0)
MCHC: 33.9 g/dL (ref 30.0–36.0)
MCV: 90 fl (ref 78.0–100.0)
Monocytes Absolute: 0.8 10*3/uL (ref 0.1–1.0)
Monocytes Relative: 7.9 % (ref 3.0–12.0)
Neutro Abs: 5.8 10*3/uL (ref 1.4–7.7)
Neutrophils Relative %: 57.3 % (ref 43.0–77.0)
Platelets: 381 10*3/uL (ref 150.0–400.0)
RBC: 4.71 Mil/uL (ref 4.22–5.81)
RDW: 11.9 % (ref 11.5–15.5)
WBC: 10 10*3/uL (ref 4.0–10.5)

## 2021-10-23 LAB — PSA: PSA: 2.36 ng/mL (ref 0.10–4.00)

## 2021-10-23 LAB — TSH: TSH: 0.76 u[IU]/mL (ref 0.35–5.50)

## 2021-10-23 LAB — LDL CHOLESTEROL, DIRECT: Direct LDL: 155 mg/dL

## 2021-10-23 NOTE — Progress Notes (Signed)
Subjective:  Patient ID: Jonathan Luna, male    DOB: 12-01-1965  Age: 56 y.o. MRN: [id number]  CC: Annual Exam, Back Pain, and Hypertension  This visit occurred during the SARS-CoV-2 public health emergency.  Safety protocols were in place, including screening questions prior to the visit, additional usage of staff PPE, and extensive cleaning of exam room while observing appropriate contact time as indicated for disinfecting solutions.    HPI Jonathan Luna presents for a CPX and f/up -   He continues to complain of nonradiating low back pain.  He is getting adequate symptom relief with Percocet and requests a refill.  He continues to complain of anxiety.  I previously prescribed clonazepam but it was not present in his urine drug screen.  I have also previously tried antidepressants but he is not willing to take one.  I have told him that I am not comfortable with him taking the combination of an opiate and benzo.  He denies SI or HI.  He has lost weight with lifestyle modifications.  He is active and denies chest pain, shortness of breath, diaphoresis, dizziness, lightheadedness, palpitations, or edema.  He complains of low libido and erectile dysfunction.  Outpatient Medications Prior to Visit  Medication Sig Dispense Refill   Ascorbic Acid (VITAMIN C PO) Take by mouth.     Cholecalciferol 50 MCG (2000 UT) TABS Take 2 tablets (4,000 Units total) by mouth daily. 180 tablet 1   fluticasone (FLONASE) 50 MCG/ACT nasal spray Place 2 sprays into both nostrils daily. 48 g 1   levocetirizine (XYZAL) 5 MG tablet Take 1 tablet (5 mg total) by mouth every evening. 90 tablet 1   levothyroxine (SYNTHROID) 125 MCG tablet TAKE ONE TABLET BY MOUTH ONE TIME DAILY 90 tablet 0   Multiple Vitamins-Minerals (ZINC PO) Take by mouth.     omega-3 acid ethyl esters (LOVAZA) 1 g capsule TAKE TWO CAPSULES BY MOUTH TWICE DAILY 360 capsule 0   Turmeric (QC TUMERIC COMPLEX PO) Take by mouth.     aspirin 81 MG tablet Take 81  mg by mouth daily.     indapamide (LOZOL) 1.25 MG tablet TAKE ONE TABLET BY MOUTH ONE TIME DAILY 90 tablet 0   irbesartan (AVAPRO) 300 MG tablet TAKE ONE TABLET BY MOUTH ONE TIME DAILY 90 tablet 0   oxyCODONE-acetaminophen (PERCOCET) 10-325 MG tablet Take 1 tablet by mouth every 8 (eight) hours as needed. for pain 90 tablet 0   No facility-administered medications prior to visit.    ROS Review of Systems  Constitutional:  Negative for chills, diaphoresis, fatigue and fever.  HENT: Negative.    Eyes: Negative.   Respiratory:  Negative for cough, chest tightness, shortness of breath and wheezing.   Cardiovascular:  Negative for chest pain, palpitations and leg swelling.  Gastrointestinal:  Negative for abdominal pain, blood in stool, constipation, nausea and vomiting.  Endocrine: Negative.   Genitourinary: Negative.  Negative for difficulty urinating, dysuria, scrotal swelling and testicular pain.  Musculoskeletal:  Positive for back pain. Negative for myalgias.  Skin: Negative.   Neurological:  Negative for dizziness and weakness.  Hematological:  Negative for adenopathy. Does not bruise/bleed easily.  Psychiatric/Behavioral:  Negative for confusion, decreased concentration, dysphoric mood and suicidal ideas. The patient is nervous/anxious.    Objective:  BP 128/84 (BP Location: Right Arm, Patient Position: Sitting, Cuff Size: Large)    Pulse 68    Temp 97.7 F (36.5 C) (Oral)    Resp 16  Ht 5\' 8"  (1.727 m)    Wt 216 lb (98 kg)    SpO2 97%    BMI 32.84 kg/m   BP Readings from Last 3 Encounters:  10/23/21 128/84  06/11/21 (!) 150/92  02/04/21 (!) 154/82    Wt Readings from Last 3 Encounters:  10/23/21 216 lb (98 kg)  06/11/21 232 lb (105.2 kg)  02/04/21 230 lb (104.3 kg)    Physical Exam Vitals reviewed.  HENT:     Nose: Nose normal.     Mouth/Throat:     Mouth: Mucous membranes are moist.  Eyes:     General: No scleral icterus.    Conjunctiva/sclera: Conjunctivae  normal.  Cardiovascular:     Rate and Rhythm: Normal rate and regular rhythm.     Heart sounds: No murmur heard. Pulmonary:     Effort: Pulmonary effort is normal.     Breath sounds: No stridor. No wheezing, rhonchi or rales.  Abdominal:     General: Abdomen is flat. Bowel sounds are normal. There is no distension.     Palpations: Abdomen is soft. There is no hepatomegaly, splenomegaly or mass.     Tenderness: There is no abdominal tenderness.  Musculoskeletal:        General: Normal range of motion.     Cervical back: Neck supple.     Lumbar back: No spasms, tenderness or bony tenderness. Normal range of motion. Negative right straight leg raise test and negative left straight leg raise test.     Right lower leg: No edema.     Left lower leg: No edema.  Lymphadenopathy:     Cervical: No cervical adenopathy.  Skin:    General: Skin is warm and dry.     Findings: No rash.  Neurological:     General: No focal deficit present.     Mental Status: He is alert.  Psychiatric:        Mood and Affect: Mood normal.        Behavior: Behavior normal.    Lab Results  Component Value Date   WBC 10.0 10/23/2021   HGB 14.3 10/23/2021   HCT 42.3 10/23/2021   PLT 381.0 10/23/2021   GLUCOSE 92 10/23/2021   CHOL 214 (H) 10/23/2021   TRIG 362.0 (H) 10/23/2021   HDL 32.10 (L) 10/23/2021   LDLDIRECT 155.0 10/23/2021   LDLCALC 106 (H) 04/07/2017   ALT 41 10/23/2021   AST 27 10/23/2021   NA 134 (L) 10/23/2021   K 3.9 10/23/2021   CL 98 10/23/2021   CREATININE 0.90 10/23/2021   BUN 14 10/23/2021   CO2 27 10/23/2021   TSH 0.76 10/23/2021   PSA 2.36 10/23/2021   HGBA1C 5.2 07/28/2012    [name]  Result Date: 02/15/2016 CLINICAL DATA:  56 year old male with chronic symptoms getting worse. Bilateral lower back pain restricting motion. Burning and anterior left high. Initial encounter. EXAM: MRI LUMBAR SPINE WITHOUT CONTRAST TECHNIQUE: Multiplanar, multisequence MR  imaging of the lumbar spine was performed. No intravenous contrast was administered. COMPARISON:  No comparison MR. 04/15/2007 CT abdomen and pelvis. FINDINGS: Last fully open disk space is labeled L5-S1. Present examination incorporates from T11 through upper S3 level. Conus upper L1 level. Remote Schmorl's node deformity L4 superior endplate. Questioned left T11 1 cm nonspecific osseous lesion. It is possible this is related to the rib articulation as versus primary osseous lesion. No other worrisome bony lesions or history of cancer to suggest this is a  significant finding. Minimal bulge T10-11 level suspected. T11-12 and T12-L1 unremarkable. L1-2:  Minimal retrolisthesis L1.  Minimal bulge. L2-3: Disc degeneration with mild adjacent endplate reactive changes. Bulge. No significant spinal stenosis or foraminal narrowing. L3-4: Minimal to mild bulge. No significant spinal stenosis or foraminal narrowing. L4-5: Moderate facet degenerative changes. Bulge. Very mild foraminal narrowing without nerve root compression. No significant spinal stenosis. L5-S1:  Mild facet degenerative changes. IMPRESSION: Degenerative changes throughout the lumbar spine most notable L4-5 level however, no significant spinal stenosis, foraminal narrowing or nerve root compression detected. Please see above for further detail. Electronically Signed   By: Genia Del M.D.   On: 02/15/2016 21:35    Assessment & Plan:   Jonathan Luna was seen today for annual exam, back pain and hypertension.  Diagnoses and all orders for this visit:  Acquired hypothyroidism- His TSH is in the normal range.  He will stay on the current dose of levothyroxine. -     TSH; Future -     TSH  Essential hypertension- His blood pressure is adequately well controlled. -     Basic metabolic panel; Future -     Hepatic function panel; Future -     CBC with Differential/Platelet; Future -     Urinalysis, Routine w reflex microscopic; Future -     Urinalysis,  Routine w reflex microscopic -     CBC with Differential/Platelet -     Hepatic function panel -     Basic metabolic panel -     indapamide (LOZOL) 1.25 MG tablet; Take 1 tablet (1.25 mg total) by mouth daily. -     irbesartan (AVAPRO) 300 MG tablet; Take 1 tablet (300 mg total) by mouth daily.  Routine general medical examination at a health care facility- Exam completed, labs reviewed, vaccines reviewed and updated, cancer screenings are up-to-date, patient education material was given. -     Lipid panel; Future -     PSA; Future -     PSA -     Lipid panel  DDD (degenerative disc disease), lumbar -     oxyCODONE-acetaminophen (PERCOCET) 10-325 MG tablet; Take 1 tablet by mouth every 8 (eight) hours as needed. for pain  Chronic low back pain without sciatica, unspecified back pain laterality -     oxyCODONE-acetaminophen (PERCOCET) 10-325 MG tablet; Take 1 tablet by mouth every 8 (eight) hours as needed. for pain  Long-term current use of opiate analgesic- His UDS is consistent. -     Urine drugs of abuse scrn w alc, routine (Ref Lab); Future -     Oxycodone/Oxymorphone, Urine; Future -     Oxycodone/Oxymorphone, Urine -     Urine drugs of abuse scrn w alc, routine (Ref Lab)  Drug-induced erectile dysfunction -     Testosterone Total,Free,Bio, Males; Future -     Testosterone Total,Free,Bio, Males  GAD (generalized anxiety disorder) -     Ambulatory referral to Psychiatry  Hyperlipidemia with target LDL less than 130- Statin therapy is not indicated.  Hypogonadism male- He is symptomatic with this.  I recommended that he start testosterone replacement therapy. -     XYOSTED 75 MG/0.5ML SOAJ; Inject 75 mg into the skin once a week.  Other orders -     LDL cholesterol, direct   I have discontinued Lucious L. Kama's aspirin. I have also changed his indapamide and irbesartan. Additionally, I am having him start on Xyosted. Lastly, I am having him maintain his fluticasone,  levocetirizine,  Cholecalciferol, Multiple Vitamins-Minerals (ZINC PO), Turmeric (QC TUMERIC COMPLEX PO), Ascorbic Acid (VITAMIN C PO), levothyroxine, omega-3 acid ethyl esters, and oxyCODONE-acetaminophen.  Meds ordered this encounter  Medications   oxyCODONE-acetaminophen (PERCOCET) 10-325 MG tablet    Sig: Take 1 tablet by mouth every 8 (eight) hours as needed. for pain    Dispense:  90 tablet    Refill:  0   indapamide (LOZOL) 1.25 MG tablet    Sig: Take 1 tablet (1.25 mg total) by mouth daily.    Dispense:  90 tablet    Refill:  1   irbesartan (AVAPRO) 300 MG tablet    Sig: Take 1 tablet (300 mg total) by mouth daily.    Dispense:  90 tablet    Refill:  1   XYOSTED 75 MG/0.5ML SOAJ    Sig: Inject 75 mg into the skin once a week.    Dispense:  5.88 mL    Refill:  1     Follow-up: Return in about 6 months (around 04/22/2022).  Scarlette Calico, MD

## 2021-10-23 NOTE — Patient Instructions (Signed)

## 2021-10-24 ENCOUNTER — Encounter: Payer: Self-pay | Admitting: Internal Medicine

## 2021-10-24 DIAGNOSIS — E291 Testicular hypofunction: Secondary | ICD-10-CM | POA: Insufficient documentation

## 2021-10-24 LAB — URINALYSIS, ROUTINE W REFLEX MICROSCOPIC
Bilirubin Urine: NEGATIVE
Hgb urine dipstick: NEGATIVE
Ketones, ur: NEGATIVE
Leukocytes,Ua: NEGATIVE
Nitrite: NEGATIVE
RBC / HPF: NONE SEEN (ref 0–?)
Specific Gravity, Urine: 1.01 (ref 1.000–1.030)
Total Protein, Urine: NEGATIVE
Urine Glucose: NEGATIVE
Urobilinogen, UA: 0.2 (ref 0.0–1.0)
pH: 6.5 (ref 5.0–8.0)

## 2021-10-24 LAB — URINE DRUGS OF ABUSE SCREEN W ALC, ROUTINE (REF LAB)
Amphetamines, Urine: NEGATIVE ng/mL
Barbiturate Quant, Ur: NEGATIVE ng/mL
Benzodiazepine Quant, Ur: NEGATIVE ng/mL
Cannabinoid Quant, Ur: NEGATIVE ng/mL
Cocaine (Metab.): NEGATIVE ng/mL
Ethanol, Urine: NEGATIVE %
Methadone Screen, Urine: NEGATIVE ng/mL
Opiate Quant, Ur: NEGATIVE ng/mL
PCP Quant, Ur: NEGATIVE ng/mL
Propoxyphene: NEGATIVE ng/mL

## 2021-10-24 LAB — TESTOSTERONE TOTAL,FREE,BIO, MALES
Albumin: 4.4 g/dL (ref 3.6–5.1)
Sex Hormone Binding: 24 nmol/L (ref 10–50)
Testosterone: 170 ng/dL — ABNORMAL LOW (ref 250–827)

## 2021-10-24 LAB — OXYCODONE/OXYMORPHONE, URINE: Oxycodone+Oxymorphone Ur Ql Scn: POSITIVE ng/mL — AB

## 2021-10-24 MED ORDER — OXYCODONE-ACETAMINOPHEN 10-325 MG PO TABS: 1.0000 | ORAL_TABLET | Freq: Three times a day (TID) | ORAL | 0 refills | Status: DC | PRN

## 2021-10-24 MED ORDER — IRBESARTAN 300 MG PO TABS: 300.0000 mg | ORAL_TABLET | Freq: Every day | ORAL | 1 refills | Status: DC

## 2021-10-24 MED ORDER — XYOSTED 75 MG/0.5ML ~~LOC~~ SOAJ: 75.0000 mg | SUBCUTANEOUS | 1 refills | Status: DC

## 2021-10-24 MED ORDER — INDAPAMIDE 1.25 MG PO TABS: 1.2500 mg | ORAL_TABLET | Freq: Every day | ORAL | 1 refills | Status: DC

## 2021-11-04 DIAGNOSIS — H59812 Chorioretinal scars after surgery for detachment, left eye: Secondary | ICD-10-CM | POA: Diagnosis not present

## 2021-11-04 DIAGNOSIS — H31011 Macula scars of posterior pole (postinflammatory) (post-traumatic), right eye: Secondary | ICD-10-CM | POA: Diagnosis not present

## 2021-11-06 ENCOUNTER — Other Ambulatory Visit: Payer: Self-pay | Admitting: Internal Medicine

## 2021-11-06 ENCOUNTER — Telehealth: Payer: Self-pay

## 2021-11-06 DIAGNOSIS — E039 Hypothyroidism, unspecified: Secondary | ICD-10-CM

## 2021-11-06 DIAGNOSIS — E291 Testicular hypofunction: Secondary | ICD-10-CM

## 2021-11-06 MED ORDER — XYOSTED 75 MG/0.5ML ~~LOC~~ SOAJ: 75.0000 mg | SUBCUTANEOUS | 1 refills | Status: DC

## 2021-11-06 NOTE — Telephone Encounter (Signed)
Jonathan Luna is calling from the pharmacy stating that the refill request that was submitted is for XYOSTED 75 MG/0.5ML SOAJ 5.88 ml but the medication is dispensed in 2 ml increments and wants the approval to go dispense 6 ml.  Jonathan Luna CB is [phone].

## 2021-11-11 ENCOUNTER — Ambulatory Visit: Payer: BC Managed Care – PPO | Admitting: Internal Medicine

## 2021-11-14 ENCOUNTER — Encounter: Payer: Self-pay | Admitting: Internal Medicine

## 2021-11-15 NOTE — Telephone Encounter (Signed)
Key: [id number]  Approved Effective from 11/15/2021 through 12/14/2021.

## 2021-11-18 ENCOUNTER — Other Ambulatory Visit: Payer: Self-pay | Admitting: Internal Medicine

## 2021-11-18 DIAGNOSIS — M5136 Other intervertebral disc degeneration, lumbar region: Secondary | ICD-10-CM

## 2021-11-18 DIAGNOSIS — G8929 Other chronic pain: Secondary | ICD-10-CM

## 2021-11-18 DIAGNOSIS — M545 Low back pain, unspecified: Secondary | ICD-10-CM

## 2021-11-20 MED ORDER — OXYCODONE-ACETAMINOPHEN 10-325 MG PO TABS: 1.0000 | ORAL_TABLET | Freq: Three times a day (TID) | ORAL | 0 refills | Status: DC | PRN

## 2021-12-12 ENCOUNTER — Encounter: Payer: Self-pay | Admitting: Internal Medicine

## 2021-12-12 NOTE — Telephone Encounter (Signed)
Key: [id number] ?

## 2021-12-12 NOTE — Telephone Encounter (Signed)
Approved ?Effective from 12/12/2021 through 01/10/2022. ?

## 2021-12-18 ENCOUNTER — Other Ambulatory Visit: Payer: Self-pay | Admitting: Internal Medicine

## 2021-12-18 DIAGNOSIS — M545 Low back pain, unspecified: Secondary | ICD-10-CM

## 2021-12-18 DIAGNOSIS — M5136 Other intervertebral disc degeneration, lumbar region: Secondary | ICD-10-CM

## 2021-12-19 MED ORDER — OXYCODONE-ACETAMINOPHEN 10-325 MG PO TABS: 1.0000 | ORAL_TABLET | Freq: Three times a day (TID) | ORAL | 0 refills | Status: DC | PRN

## 2021-12-23 ENCOUNTER — Telehealth: Payer: Self-pay | Admitting: Internal Medicine

## 2021-12-23 NOTE — Telephone Encounter (Signed)
Rep w/ [facility] checking status of pa for XYOSTED 75 MG/0.5ML SOAJ ? ?Rep states request was last faxed 3-8 ? ?Informed rep I did npt locate fax, inquired about pa w/ cma, cma was not aware of pa ? ?Fax # [phone] was giving to rep to re-fax request ? ? ? ? ?

## 2021-12-24 NOTE — Telephone Encounter (Signed)
PA form has been received via pod fax, completed and sent back for determination. ?

## 2022-01-03 NOTE — Telephone Encounter (Signed)
PA for testosterone was denied.

## 2022-01-07 NOTE — Telephone Encounter (Signed)
Rep w/ [facility] states an appeal has been requested for xyosted ? ?Rep requesting verbal or faxed list of pts symptoms for the use of xyosted  ? ?Phone [phone] ext [phone] ? ?Fax [phone] ?

## 2022-01-09 NOTE — Telephone Encounter (Signed)
Info faxed

## 2022-01-15 NOTE — Telephone Encounter (Signed)
Noted  

## 2022-01-15 NOTE — Telephone Encounter (Signed)
New note not needed °

## 2022-01-15 NOTE — Telephone Encounter (Addendum)
Jonathan Luna called regarding the appeal. ? ?She is requesting supporting documentation of the Dx with signs and symptoms.  ? ?I confirmed which office note was needed and faxed it to [phone] Attn: Shelia.  Confirmation was received. ?

## 2022-01-17 ENCOUNTER — Other Ambulatory Visit: Payer: Self-pay | Admitting: Internal Medicine

## 2022-01-17 DIAGNOSIS — G8929 Other chronic pain: Secondary | ICD-10-CM

## 2022-01-17 DIAGNOSIS — M5136 Other intervertebral disc degeneration, lumbar region: Secondary | ICD-10-CM

## 2022-01-20 ENCOUNTER — Other Ambulatory Visit: Payer: Self-pay | Admitting: Internal Medicine

## 2022-01-20 DIAGNOSIS — M545 Low back pain, unspecified: Secondary | ICD-10-CM

## 2022-01-20 DIAGNOSIS — M5136 Other intervertebral disc degeneration, lumbar region: Secondary | ICD-10-CM

## 2022-01-20 MED ORDER — OXYCODONE-ACETAMINOPHEN 10-325 MG PO TABS: 1.0000 | ORAL_TABLET | Freq: Three times a day (TID) | ORAL | 0 refills | Status: DC | PRN

## 2022-02-06 ENCOUNTER — Other Ambulatory Visit: Payer: Self-pay | Admitting: Internal Medicine

## 2022-02-06 DIAGNOSIS — E039 Hypothyroidism, unspecified: Secondary | ICD-10-CM

## 2022-02-25 ENCOUNTER — Ambulatory Visit: Payer: BC Managed Care – PPO | Admitting: Internal Medicine

## 2022-02-25 ENCOUNTER — Ambulatory Visit (INDEPENDENT_AMBULATORY_CARE_PROVIDER_SITE_OTHER): Payer: BC Managed Care – PPO

## 2022-02-25 ENCOUNTER — Encounter: Payer: Self-pay | Admitting: Internal Medicine

## 2022-02-25 VITALS — BP 140/88 | HR 84 | Temp 97.9°F | Ht 68.0 in | Wt 212.0 lb

## 2022-02-25 DIAGNOSIS — E039 Hypothyroidism, unspecified: Secondary | ICD-10-CM

## 2022-02-25 DIAGNOSIS — M25562 Pain in left knee: Secondary | ICD-10-CM

## 2022-02-25 DIAGNOSIS — I10 Essential (primary) hypertension: Secondary | ICD-10-CM | POA: Diagnosis not present

## 2022-02-25 DIAGNOSIS — G8929 Other chronic pain: Secondary | ICD-10-CM

## 2022-02-25 DIAGNOSIS — Z23 Encounter for immunization: Secondary | ICD-10-CM

## 2022-02-25 DIAGNOSIS — M5136 Other intervertebral disc degeneration, lumbar region: Secondary | ICD-10-CM

## 2022-02-25 DIAGNOSIS — M545 Low back pain, unspecified: Secondary | ICD-10-CM | POA: Diagnosis not present

## 2022-02-25 MED ORDER — OXYCODONE-ACETAMINOPHEN 10-325 MG PO TABS: 1.0000 | ORAL_TABLET | Freq: Three times a day (TID) | ORAL | 0 refills | Status: DC | PRN

## 2022-02-25 NOTE — Patient Instructions (Signed)
Osteoarthritis  Osteoarthritis is a type of arthritis. It refers to joint pain or joint disease. Osteoarthritis affects tissue that covers the ends of bones in joints (cartilage). Cartilage acts as a cushion between the bones and helps them move smoothly. Osteoarthritis occurs when cartilage in the joints gets worn down. Osteoarthritis is sometimes called "wear and tear" arthritis. Osteoarthritis is the most common form of arthritis. It often occurs in older people. It is a condition that gets worse over time. The joints most often affected by this condition are in the fingers, toes, hips, knees, and spine, including the neck and lower back. What are the causes? This condition is caused by the wearing down of cartilage that covers the ends of bones. What increases the risk? The following factors may make you more likely to develop this condition: Being age 50 or older. Obesity. Overuse of joints. Past injury of a joint. Past surgery on a joint. Family history of osteoarthritis. What are the signs or symptoms? The main symptoms of this condition are pain, swelling, and stiffness in the joint. Other symptoms may include: An enlarged joint. More pain and further damage caused by small pieces of bone or cartilage that break off and float inside of the joint. Small deposits of bone (osteophytes) that grow on the edges of the joint. A grating or scraping feeling inside the joint when you move it. Popping or creaking sounds when you move. Difficulty walking or exercising. An inability to grip items, twist your hand(s), or control the movements of your hands and fingers. How is this diagnosed? This condition may be diagnosed based on: Your medical history. A physical exam. Your symptoms. X-rays of the affected joint(s). Blood tests to rule out other types of arthritis. How is this treated? There is no cure for this condition, but treatment can help control pain and improve joint function.  Treatment may include a combination of therapies, such as: Pain relief techniques, such as: Applying heat and cold to the joint. Massage. A form of talk therapy called cognitive behavioral therapy (CBT). This therapy helps you set goals and follow up on the changes that you make. Medicines for pain and inflammation. The medicines can be taken by mouth or applied to the skin. They include: NSAIDs, such as ibuprofen. Prescription medicines. Strong anti-inflammatory medicines (corticosteroids). Certain nutritional supplements. A prescribed exercise program. You may work with a physical therapist. Assistive devices, such as a brace, wrap, splint, specialized glove, or cane. A weight control plan. Surgery, such as: An osteotomy. This is done to reposition the bones and relieve pain or to remove loose pieces of bone and cartilage. Joint replacement surgery. You may need this surgery if you have advanced osteoarthritis. Follow these instructions at home: Activity Rest your affected joints as told by your health care provider. Exercise as told by your health care provider. He or she may recommend specific types of exercise, such as: Strengthening exercises. These are done to strengthen the muscles that support joints affected by arthritis. Aerobic activities. These are exercises, such as brisk walking or water aerobics, that increase your heart rate. Range-of-motion activities. These help your joints move more easily. Balance and agility exercises. Managing pain, stiffness, and swelling     If directed, apply heat to the affected area as often as told by your health care provider. Use the heat source that your health care provider recommends, such as a moist heat pack or a heating pad. If you have a removable assistive device, remove it   as told by your health care provider. Place a towel between your skin and the heat source. If your health care provider tells you to keep the assistive device  on while you apply heat, place a towel between the assistive device and the heat source. Leave the heat on for 20-30 minutes. Remove the heat if your skin turns bright red. This is especially important if you are unable to feel pain, heat, or cold. You may have a greater risk of getting burned. If directed, put ice on the affected area. To do this: If you have a removable assistive device, remove it as told by your health care provider. Put ice in a plastic bag. Place a towel between your skin and the bag. If your health care provider tells you to keep the assistive device on during icing, place a towel between the assistive device and the bag. Leave the ice on for 20 minutes, 2-3 times a day. Move your fingers or toes often to reduce stiffness and swelling. Raise (elevate) the injured area above the level of your heart while you are sitting or lying down. General instructions Take over-the-counter and prescription medicines only as told by your health care provider. Maintain a healthy weight. Follow instructions from your health care provider for weight control. Do not use any products that contain nicotine or tobacco, such as cigarettes, e-cigarettes, and chewing tobacco. If you need help quitting, ask your health care provider. Use assistive devices as told by your health care provider. Keep all follow-up visits as told by your health care provider. This is important. Where to find more information National Institute of Arthritis and Musculoskeletal and Skin Diseases: www.niams.nih.gov National Institute on Aging: www.nia.nih.gov American College of Rheumatology: www.rheumatology.org Contact a health care provider if: You have redness, swelling, or a feeling of warmth in a joint that gets worse. You have a fever along with joint or muscle aches. You develop a rash. You have trouble doing your normal activities. Get help right away if: You have pain that gets worse and is not relieved by  pain medicine. Summary Osteoarthritis is a type of arthritis that affects tissue covering the ends of bones in joints (cartilage). This condition is caused by the wearing down of cartilage that covers the ends of bones. The main symptom of this condition is pain, swelling, and stiffness in the joint. There is no cure for this condition, but treatment can help control pain and improve joint function. This information is not intended to replace advice given to you by your health care provider. Make sure you discuss any questions you have with your health care provider. Document Revised: 09/26/2019 Document Reviewed: 09/26/2019 Elsevier Patient Education  2023 [employer]  

## 2022-02-25 NOTE — Progress Notes (Signed)
Subjective:  Patient ID: Jonathan Luna, male    DOB: 1965/12/12  Age: 56 y.o. MRN: [id number]  CC: Hypertension and Hypothyroidism   HPI Jonathan Luna presents for f/up -  He walks several miles a day and does not experience chest pain, shortness of breath, diaphoresis, dizziness, lightheadedness, or edema.  He complains of a 1 month history of left knee pain.  He denies trauma or injury.  He initially noticed some swelling but the swelling has resolved.  He occasionally adds Aleve to the Percocet for pain.  He also complains of widespread musculoskeletal pain and low back pain.  The low back pain does not radiate into his extremities.  Outpatient Medications Prior to Visit  Medication Sig Dispense Refill  . Ascorbic Acid (VITAMIN C PO) Take by mouth.    . Cholecalciferol 50 MCG (2000 UT) TABS Take 2 tablets (4,000 Units total) by mouth daily. 180 tablet 1  . fluticasone (FLONASE) 50 MCG/ACT nasal spray Place 2 sprays into both nostrils daily. 48 g 1  . irbesartan (AVAPRO) 300 MG tablet Take 1 tablet (300 mg total) by mouth daily. 90 tablet 1  . levocetirizine (XYZAL) 5 MG tablet Take 1 tablet (5 mg total) by mouth every evening. 90 tablet 1  . levothyroxine (SYNTHROID) 125 MCG tablet TAKE ONE TABLET BY MOUTH ONE TIME DAILY 90 tablet 0  . Multiple Vitamins-Minerals (ZINC PO) Take by mouth.    . omega-3 acid ethyl esters (LOVAZA) 1 g capsule TAKE TWO CAPSULES BY MOUTH TWICE DAILY 360 capsule 0  . Turmeric (QC TUMERIC COMPLEX PO) Take by mouth.    . XYOSTED 75 MG/0.5ML SOAJ Inject 75 mg into the skin once a week. 6 mL 1  . indapamide (LOZOL) 1.25 MG tablet Take 1 tablet (1.25 mg total) by mouth daily. 90 tablet 1  . oxyCODONE-acetaminophen (PERCOCET) 10-325 MG tablet Take 1 tablet by mouth every 8 (eight) hours as needed. for pain 90 tablet 0   No facility-administered medications prior to visit.    ROS Review of Systems  Constitutional:  Negative for chills, diaphoresis, fatigue and fever.   HENT: Negative.    Eyes: Negative.   Respiratory:  Negative for cough, chest tightness, shortness of breath and wheezing.   Cardiovascular:  Negative for chest pain, palpitations and leg swelling.  Gastrointestinal:  Negative for abdominal pain, blood in stool, constipation, diarrhea, nausea and vomiting.  Endocrine: Negative.   Genitourinary: Negative.  Negative for difficulty urinating.  Musculoskeletal:  Positive for arthralgias and back pain. Negative for joint swelling and myalgias.  Skin: Negative.   Neurological:  Negative for dizziness, weakness, light-headedness, numbness and headaches.  Hematological:  Negative for adenopathy. Does not bruise/bleed easily.  Psychiatric/Behavioral: Negative.     Objective:  BP 140/88 (BP Location: Left Arm, Patient Position: Sitting, Cuff Size: Large)   Pulse 84   Temp 97.9 F (36.6 C) (Oral)   Ht '5\' 8"'$  (1.727 m)   Wt 212 lb (96.2 kg)   SpO2 99%   BMI 32.23 kg/m   BP Readings from Last 3 Encounters:  02/25/22 140/88  10/23/21 128/84  06/11/21 (!) 150/92    Wt Readings from Last 3 Encounters:  02/25/22 212 lb (96.2 kg)  10/23/21 216 lb (98 kg)  06/11/21 232 lb (105.2 kg)    Physical Exam Vitals reviewed.  HENT:     Nose: Nose normal.     Mouth/Throat:     Mouth: Mucous membranes are moist.  Eyes:  General: No scleral icterus.    Conjunctiva/sclera: Conjunctivae normal.  Cardiovascular:     Rate and Rhythm: Normal rate and regular rhythm.     Heart sounds: No murmur heard. Pulmonary:     Effort: Pulmonary effort is normal.     Breath sounds: No stridor. No wheezing, rhonchi or rales.  Abdominal:     General: Abdomen is flat.     Palpations: There is no mass.     Tenderness: There is no abdominal tenderness. There is no guarding.     Hernia: No hernia is present.  Musculoskeletal:        General: No swelling. Normal range of motion.     Cervical back: Neck supple.     Lumbar back: Normal.     Right knee: Normal.      Left knee: Normal. No swelling, deformity, effusion, erythema, bony tenderness or crepitus. Normal range of motion.     Right lower leg: No edema.     Left lower leg: No edema.  Lymphadenopathy:     Cervical: No cervical adenopathy.  Skin:    General: Skin is warm and dry.  Neurological:     General: No focal deficit present.     Mental Status: He is alert. Mental status is at baseline.  Psychiatric:        Mood and Affect: Mood normal.        Behavior: Behavior normal.    Lab Results  Component Value Date   WBC 11.0 (H) 02/25/2022   HGB 12.3 (L) 02/25/2022   HCT 36.9 (L) 02/25/2022   PLT 436.0 (H) 02/25/2022   GLUCOSE 89 02/25/2022   CHOL 214 (H) 10/23/2021   TRIG 362.0 (H) 10/23/2021   HDL 32.10 (L) 10/23/2021   LDLDIRECT 155.0 10/23/2021   LDLCALC 106 (H) 04/07/2017   ALT 41 10/23/2021   AST 27 10/23/2021   NA 125 (L) 02/25/2022   K 3.7 02/25/2022   CL 90 (L) 02/25/2022   CREATININE 1.42 02/25/2022   BUN 9 02/25/2022   CO2 25 02/25/2022   TSH 2.46 02/25/2022   PSA 2.36 10/23/2021   HGBA1C 5.2 07/28/2012    [name]  Result Date: 02/15/2016 CLINICAL DATA:  56 year old male with chronic symptoms getting worse. Bilateral lower back pain restricting motion. Burning and anterior left high. Initial encounter. EXAM: MRI LUMBAR SPINE WITHOUT CONTRAST TECHNIQUE: Multiplanar, multisequence MR imaging of the lumbar spine was performed. No intravenous contrast was administered. COMPARISON:  No comparison MR. 04/15/2007 CT abdomen and pelvis. FINDINGS: Last fully open disk space is labeled L5-S1. Present examination incorporates from T11 through upper S3 level. Conus upper L1 level. Remote Schmorl's node deformity L4 superior endplate. Questioned left T11 1 cm nonspecific osseous lesion. It is possible this is related to the rib articulation as versus primary osseous lesion. No other worrisome bony lesions or history of cancer to suggest this is a significant  finding. Minimal bulge T10-11 level suspected. T11-12 and T12-L1 unremarkable. L1-2:  Minimal retrolisthesis L1.  Minimal bulge. L2-3: Disc degeneration with mild adjacent endplate reactive changes. Bulge. No significant spinal stenosis or foraminal narrowing. L3-4: Minimal to mild bulge. No significant spinal stenosis or foraminal narrowing. L4-5: Moderate facet degenerative changes. Bulge. Very mild foraminal narrowing without nerve root compression. No significant spinal stenosis. L5-S1:  Mild facet degenerative changes. IMPRESSION: Degenerative changes throughout the lumbar spine most notable L4-5 level however, no significant spinal stenosis, foraminal narrowing or nerve root compression detected. Please see  above for further detail. Electronically Signed   By: Genia Del M.D.   On: 02/15/2016 21:35   DG Knee Complete 4 Views Left  Result Date: 02/26/2022 CLINICAL DATA:  Left knee pain x 2-3 months. EXAM: LEFT KNEE - COMPLETE 4+ VIEW COMPARISON:  None Available. FINDINGS: No evidence of fracture, dislocation, or joint effusion. No evidence of arthropathy or other focal bone abnormality. Soft tissues are unremarkable. IMPRESSION: Negative. Electronically Signed   By: Ronney Asters M.D.   On: 02/26/2022 22:23     Assessment & Plan:   Jonathan Luna was seen today for hypertension and hypothyroidism.  Diagnoses and all orders for this visit:  Acquired hypothyroidism- He is euthyroid. -     TSH; Future -     TSH  Essential hypertension- His blood pressure is adequately well controlled but his sodium and chloride are low.  I have asked him to stop taking indapamide. -     Basic metabolic panel; Future -     CBC with Differential/Platelet; Future -     TSH; Future -     TSH -     CBC with Differential/Platelet -     Basic metabolic panel  DDD (degenerative disc disease), lumbar -     oxyCODONE-acetaminophen (PERCOCET) 10-325 MG tablet; Take 1 tablet by mouth every 8 (eight) hours as needed. for  pain  Chronic low back pain without sciatica, unspecified back pain laterality -     oxyCODONE-acetaminophen (PERCOCET) 10-325 MG tablet; Take 1 tablet by mouth every 8 (eight) hours as needed. for pain  Acute pain of left knee- Will refer to orthopedics. -     DG Knee Complete 4 Views Left; Future -     CBC with Differential/Platelet; Future -     C-reactive protein; Future -     C-reactive protein -     CBC with Differential/Platelet  Other orders -     Tdap vaccine greater than or equal to 7yo IM -     Varicella-zoster vaccine IM (Shingrix)   I have discontinued Jonathan Luna's indapamide. I am also having him maintain his fluticasone, levocetirizine, Cholecalciferol, Multiple Vitamins-Minerals (ZINC PO), Turmeric (QC TUMERIC COMPLEX PO), Ascorbic Acid (VITAMIN C PO), omega-3 acid ethyl esters, irbesartan, Xyosted, levothyroxine, and oxyCODONE-acetaminophen.  Meds ordered this encounter  Medications  . oxyCODONE-acetaminophen (PERCOCET) 10-325 MG tablet    Sig: Take 1 tablet by mouth every 8 (eight) hours as needed. for pain    Dispense:  90 tablet    Refill:  0     Follow-up: Return in about 6 months (around 08/28/2022).  Scarlette Calico, MD

## 2022-02-26 ENCOUNTER — Encounter: Payer: Self-pay | Admitting: Internal Medicine

## 2022-02-26 LAB — BASIC METABOLIC PANEL
BUN: 9 mg/dL (ref 6–23)
CO2: 25 mEq/L (ref 19–32)
Calcium: 9.1 mg/dL (ref 8.4–10.5)
Chloride: 90 mEq/L — ABNORMAL LOW (ref 96–112)
Creatinine, Ser: 1.42 mg/dL (ref 0.40–1.50)
GFR: 55.5 mL/min — ABNORMAL LOW (ref >60.00)
Glucose, Bld: 89 mg/dL (ref 70–99)
Potassium: 3.7 mEq/L (ref 3.5–5.1)
Sodium: 125 mEq/L — ABNORMAL LOW (ref 135–145)

## 2022-02-26 LAB — CBC WITH DIFFERENTIAL/PLATELET
Basophils Absolute: 0.1 10*3/uL (ref 0.0–0.1)
Basophils Relative: 1.1 % (ref 0.0–3.0)
Eosinophils Absolute: 0.2 10*3/uL (ref 0.0–0.7)
Eosinophils Relative: 1.6 % (ref 0.0–5.0)
HCT: 36.9 % — ABNORMAL LOW (ref 39.0–52.0)
Hemoglobin: 12.3 g/dL — ABNORMAL LOW (ref 13.0–17.0)
Lymphocytes Relative: 29.3 % (ref 12.0–46.0)
Lymphs Abs: 3.2 10*3/uL (ref 0.7–4.0)
MCHC: 33.4 g/dL (ref 30.0–36.0)
MCV: 87.1 fl (ref 78.0–100.0)
Monocytes Absolute: 1 10*3/uL (ref 0.1–1.0)
Monocytes Relative: 8.7 % (ref 3.0–12.0)
Neutro Abs: 6.5 10*3/uL (ref 1.4–7.7)
Neutrophils Relative %: 59.3 % (ref 43.0–77.0)
Platelets: 436 10*3/uL — ABNORMAL HIGH (ref 150.0–400.0)
RBC: 4.24 Mil/uL (ref 4.22–5.81)
RDW: 13.2 % (ref 11.5–15.5)
WBC: 11 10*3/uL — ABNORMAL HIGH (ref 4.0–10.5)

## 2022-02-26 LAB — C-REACTIVE PROTEIN: CRP: 1.5 mg/dL (ref 0.5–20.0)

## 2022-02-26 LAB — TSH: TSH: 2.46 u[IU]/mL (ref 0.35–5.50)

## 2022-02-27 ENCOUNTER — Encounter: Payer: Self-pay | Admitting: Internal Medicine

## 2022-02-27 ENCOUNTER — Ambulatory Visit (INDEPENDENT_AMBULATORY_CARE_PROVIDER_SITE_OTHER): Payer: BC Managed Care – PPO

## 2022-02-27 ENCOUNTER — Ambulatory Visit: Payer: BC Managed Care – PPO | Admitting: Internal Medicine

## 2022-02-27 VITALS — BP 138/86 | HR 102 | Temp 98.3°F | Ht 68.0 in | Wt 212.0 lb

## 2022-02-27 DIAGNOSIS — I10 Essential (primary) hypertension: Secondary | ICD-10-CM | POA: Diagnosis not present

## 2022-02-27 DIAGNOSIS — D539 Nutritional anemia, unspecified: Secondary | ICD-10-CM | POA: Diagnosis not present

## 2022-02-27 DIAGNOSIS — R058 Other specified cough: Secondary | ICD-10-CM | POA: Diagnosis not present

## 2022-02-27 DIAGNOSIS — M25562 Pain in left knee: Secondary | ICD-10-CM

## 2022-02-27 DIAGNOSIS — D508 Other iron deficiency anemias: Secondary | ICD-10-CM | POA: Diagnosis not present

## 2022-02-27 DIAGNOSIS — R059 Cough, unspecified: Secondary | ICD-10-CM | POA: Diagnosis not present

## 2022-02-27 LAB — BASIC METABOLIC PANEL
BUN: 9 mg/dL (ref 6–23)
CO2: 28 mEq/L (ref 19–32)
Calcium: 10.1 mg/dL (ref 8.4–10.5)
Chloride: 97 mEq/L (ref 96–112)
Creatinine, Ser: 0.81 mg/dL (ref 0.40–1.50)
GFR: 99.02 mL/min (ref >60.00)
Glucose, Bld: 109 mg/dL — ABNORMAL HIGH (ref 70–99)
Potassium: 4.1 mEq/L (ref 3.5–5.1)
Sodium: 133 mEq/L — ABNORMAL LOW (ref 135–145)

## 2022-02-27 LAB — IBC + FERRITIN
Ferritin: 180.2 ng/mL (ref 22.0–322.0)
Iron: 35 ug/dL — ABNORMAL LOW (ref 42–165)
Saturation Ratios: 11.1 % — ABNORMAL LOW (ref 20.0–50.0)
TIBC: 316.4 ug/dL (ref 250.0–450.0)
Transferrin: 226 mg/dL (ref 212.0–360.0)

## 2022-02-27 LAB — CBC WITH DIFFERENTIAL/PLATELET
Basophils Absolute: 0.1 10*3/uL (ref 0.0–0.1)
Basophils Relative: 0.7 % (ref 0.0–3.0)
Eosinophils Absolute: 0.1 10*3/uL (ref 0.0–0.7)
Eosinophils Relative: 0.5 % (ref 0.0–5.0)
HCT: 40.5 % (ref 39.0–52.0)
Hemoglobin: 13.4 g/dL (ref 13.0–17.0)
Lymphocytes Relative: 17.1 % (ref 12.0–46.0)
Lymphs Abs: 2 10*3/uL (ref 0.7–4.0)
MCHC: 33.1 g/dL (ref 30.0–36.0)
MCV: 87.6 fl (ref 78.0–100.0)
Monocytes Absolute: 0.9 10*3/uL (ref 0.1–1.0)
Monocytes Relative: 7.8 % (ref 3.0–12.0)
Neutro Abs: 8.7 10*3/uL — ABNORMAL HIGH (ref 1.4–7.7)
Neutrophils Relative %: 73.9 % (ref 43.0–77.0)
Platelets: 455 10*3/uL — ABNORMAL HIGH (ref 150.0–400.0)
RBC: 4.62 Mil/uL (ref 4.22–5.81)
RDW: 13.2 % (ref 11.5–15.5)
WBC: 11.8 10*3/uL — ABNORMAL HIGH (ref 4.0–10.5)

## 2022-02-27 LAB — VITAMIN B12: Vitamin B-12: 370 pg/mL (ref 211–911)

## 2022-02-27 LAB — HEPATIC FUNCTION PANEL
ALT: 17 U/L (ref 0–53)
AST: 14 U/L (ref 0–37)
Albumin: 4.6 g/dL (ref 3.5–5.2)
Alkaline Phosphatase: 73 U/L (ref 39–117)
Bilirubin, Direct: 0.1 mg/dL (ref 0.0–0.3)
Total Bilirubin: 0.5 mg/dL (ref 0.2–1.2)
Total Protein: 8.1 g/dL (ref 6.0–8.3)

## 2022-02-27 LAB — FOLATE: Folate: 21 ng/mL (ref >5.9)

## 2022-02-27 MED ORDER — PROMETHAZINE-DM 6.25-15 MG/5ML PO SYRP: 5.0000 mL | ORAL_SOLUTION | Freq: Four times a day (QID) | ORAL | 0 refills | Status: AC | PRN

## 2022-02-27 MED ORDER — ACCRUFER 30 MG PO CAPS: 1.0000 | ORAL_CAPSULE | Freq: Two times a day (BID) | ORAL | 1 refills | Status: DC

## 2022-02-27 NOTE — Progress Notes (Unsigned)
Subjective:  Patient ID: Jonathan Luna, male    DOB: [birth year]  Age: 56 y.o. MRN: [id number]  CC: Cough and Anemia   HPI Jonathan Luna presents for f/up -  He complains of a 2-day history of cough productive of yellow phlegm with shortness of breath, chest tightness, low-grade fever, and night sweats.  He denies chills, pleuritic chest pain, or hemoptysis.  Outpatient Medications Prior to Visit  Medication Sig Dispense Refill   Ascorbic Acid (VITAMIN C PO) Take by mouth.     Cholecalciferol 50 MCG (2000 UT) TABS Take 2 tablets (4,000 Units total) by mouth daily. 180 tablet 1   fluticasone (FLONASE) 50 MCG/ACT nasal spray Place 2 sprays into both nostrils daily. 48 g 1   irbesartan (AVAPRO) 300 MG tablet Take 1 tablet (300 mg total) by mouth daily. 90 tablet 1   levocetirizine (XYZAL) 5 MG tablet Take 1 tablet (5 mg total) by mouth every evening. 90 tablet 1   levothyroxine (SYNTHROID) 125 MCG tablet TAKE ONE TABLET BY MOUTH ONE TIME DAILY 90 tablet 0   Multiple Vitamins-Minerals (ZINC PO) Take by mouth.     omega-3 acid ethyl esters (LOVAZA) 1 g capsule TAKE TWO CAPSULES BY MOUTH TWICE DAILY 360 capsule 0   oxyCODONE-acetaminophen (PERCOCET) 10-325 MG tablet Take 1 tablet by mouth every 8 (eight) hours as needed. for pain 90 tablet 0   Turmeric (QC TUMERIC COMPLEX PO) Take by mouth.     XYOSTED 75 MG/0.5ML SOAJ Inject 75 mg into the skin once a week. 6 mL 1   No facility-administered medications prior to visit.    ROS Review of Systems  Constitutional:  Positive for fever. Negative for appetite change, diaphoresis and fatigue.  HENT: Negative.  Negative for sore throat and trouble swallowing.   Eyes: Negative.   Respiratory:  Positive for cough, chest tightness and shortness of breath. Negative for wheezing.   Cardiovascular:  Negative for chest pain, palpitations and leg swelling.  Gastrointestinal:  Negative for abdominal pain, blood in stool, constipation, diarrhea, nausea and  vomiting.  Endocrine: Negative.   Genitourinary: Negative.  Negative for difficulty urinating and dysuria.  Musculoskeletal:  Positive for arthralgias and back pain. Negative for myalgias.  Skin: Negative.   Neurological:  Negative for dizziness, weakness, light-headedness and headaches.  Hematological:  Negative for adenopathy. Does not bruise/bleed easily.  Psychiatric/Behavioral: Negative.     Objective:  BP 138/86 (BP Location: Right Arm, Patient Position: Sitting, Cuff Size: Large)   Pulse (!) 102   Temp 98.3 F (36.8 C) (Oral)   Ht '5\' 8"'$  (1.727 m)   Wt 212 lb (96.2 kg)   SpO2 99%   BMI 32.23 kg/m   BP Readings from Last 3 Encounters:  02/27/22 138/86  02/25/22 140/88  10/23/21 128/84    Wt Readings from Last 3 Encounters:  02/27/22 212 lb (96.2 kg)  02/25/22 212 lb (96.2 kg)  10/23/21 216 lb (98 kg)    Physical Exam Vitals reviewed.  Constitutional:      General: He is not in acute distress.    Appearance: He is not ill-appearing, toxic-appearing or diaphoretic.  HENT:     Nose: Nose normal.     Mouth/Throat:     Mouth: Mucous membranes are moist.  Eyes:     General: No scleral icterus.    Conjunctiva/sclera: Conjunctivae normal.  Cardiovascular:     Rate and Rhythm: Regular rhythm. Tachycardia present.     Pulses: Normal pulses.  Heart sounds: No murmur heard.   No friction rub. No gallop.  Pulmonary:     Effort: Pulmonary effort is normal.     Breath sounds: No stridor. No wheezing, rhonchi or rales.  Abdominal:     General: Abdomen is flat.     Palpations: There is no mass.     Tenderness: There is no abdominal tenderness. There is no guarding.     Hernia: No hernia is present.  Musculoskeletal:        General: Normal range of motion.     Cervical back: Neck supple.     Right lower leg: No edema.     Left lower leg: No edema.  Lymphadenopathy:     Cervical: No cervical adenopathy.  Skin:    General: Skin is warm and dry.     Findings: No  rash.  Neurological:     General: No focal deficit present.     Mental Status: He is alert. Mental status is at baseline.    Lab Results  Component Value Date   WBC 11.8 (H) 02/27/2022   HGB 13.4 02/27/2022   HCT 40.5 02/27/2022   PLT 455.0 (H) 02/27/2022   GLUCOSE 109 (H) 02/27/2022   CHOL 214 (H) 10/23/2021   TRIG 362.0 (H) 10/23/2021   HDL 32.10 (L) 10/23/2021   LDLDIRECT 155.0 10/23/2021   LDLCALC 106 (H) 04/07/2017   ALT 17 02/27/2022   AST 14 02/27/2022   NA 133 (L) 02/27/2022   K 4.1 02/27/2022   CL 97 02/27/2022   CREATININE 0.81 02/27/2022   BUN 9 02/27/2022   CO2 28 02/27/2022   TSH 2.46 02/25/2022   PSA 2.36 10/23/2021   HGBA1C 5.2 07/28/2012    [name]  Result Date: 02/15/2016 CLINICAL DATA:  56 year old male with chronic symptoms getting worse. Bilateral lower back pain restricting motion. Burning and anterior left high. Initial encounter. EXAM: MRI LUMBAR SPINE WITHOUT CONTRAST TECHNIQUE: Multiplanar, multisequence MR imaging of the lumbar spine was performed. No intravenous contrast was administered. COMPARISON:  No comparison MR. 04/15/2007 CT abdomen and pelvis. FINDINGS: Last fully open disk space is labeled L5-S1. Present examination incorporates from T11 through upper S3 level. Conus upper L1 level. Remote Schmorl's node deformity L4 superior endplate. Questioned left T11 1 cm nonspecific osseous lesion. It is possible this is related to the rib articulation as versus primary osseous lesion. No other worrisome bony lesions or history of cancer to suggest this is a significant finding. Minimal bulge T10-11 level suspected. T11-12 and T12-L1 unremarkable. L1-2:  Minimal retrolisthesis L1.  Minimal bulge. L2-3: Disc degeneration with mild adjacent endplate reactive changes. Bulge. No significant spinal stenosis or foraminal narrowing. L3-4: Minimal to mild bulge. No significant spinal stenosis or foraminal narrowing. L4-5: Moderate facet  degenerative changes. Bulge. Very mild foraminal narrowing without nerve root compression. No significant spinal stenosis. L5-S1:  Mild facet degenerative changes. IMPRESSION: Degenerative changes throughout the lumbar spine most notable L4-5 level however, no significant spinal stenosis, foraminal narrowing or nerve root compression detected. Please see above for further detail. Electronically Signed   By: Genia Del M.D.   On: 02/15/2016 21:35   DG Chest 2 View  Result Date: 02/27/2022 CLINICAL DATA:  Cough EXAM: CHEST - 2 VIEW COMPARISON:  02/22/2014 FINDINGS: The heart size and mediastinal contours are within normal limits. Both lungs are clear. The visualized skeletal structures are unremarkable. IMPRESSION: No active cardiopulmonary disease. Electronically Signed   By: Prudy Feeler.D.  On: 02/27/2022 10:41     Assessment & Plan:   Jonathan Luna was seen today for cough and anemia.  Diagnoses and all orders for this visit:  Deficiency anemia -     Vitamin B12; Future -     CBC with Differential/Platelet; Future -     Vitamin B1; Future -     Folate; Future -     IBC + Ferritin; Future -     Basic metabolic panel; Future -     Hepatic function panel; Future -     Hepatic function panel -     Basic metabolic panel -     IBC + Ferritin -     Folate -     Vitamin B1 -     CBC with Differential/Platelet -     Vitamin B12  Cough productive of purulent sputum -     DG Chest 2 View; Future -     Novel Coronavirus, NAA ([facility]); Future -     promethazine-dextromethorphan (PROMETHAZINE-DM) 6.25-15 MG/5ML syrup; Take 5 mLs by mouth 4 (four) times daily as needed for up to 7 days for cough. -     Novel Coronavirus, NAA ([facility])  Essential hypertension -     Basic metabolic panel; Future -     Hepatic function panel; Future -     Hepatic function panel -     Basic metabolic panel  Iron deficiency anemia secondary to inadequate dietary iron intake -     Ferric Maltol (ACCRUFER)  30 MG CAPS; Take 1 capsule by mouth in the morning and at bedtime. -     Ambulatory referral to Gastroenterology  Acute pain of left knee -     Ambulatory referral to Orthopedic Surgery   I am having Ruffin Frederick start on promethazine-dextromethorphan and [place]. I am also having him maintain his fluticasone, levocetirizine, Cholecalciferol, Multiple Vitamins-Minerals (ZINC PO), Turmeric (QC TUMERIC COMPLEX PO), Ascorbic Acid (VITAMIN C PO), omega-3 acid ethyl esters, irbesartan, Xyosted, levothyroxine, and oxyCODONE-acetaminophen.  Meds ordered this encounter  Medications   promethazine-dextromethorphan (PROMETHAZINE-DM) 6.25-15 MG/5ML syrup    Sig: Take 5 mLs by mouth 4 (four) times daily as needed for up to 7 days for cough.    Dispense:  118 mL    Refill:  0   Ferric Maltol (ACCRUFER) 30 MG CAPS    Sig: Take 1 capsule by mouth in the morning and at bedtime.    Dispense:  180 capsule    Refill:  1     Follow-up: No follow-ups on file.  Scarlette Calico, MD

## 2022-02-28 ENCOUNTER — Ambulatory Visit: Payer: BC Managed Care – PPO | Admitting: Internal Medicine

## 2022-02-28 LAB — NOVEL CORONAVIRUS, NAA: SARS-CoV-2, NAA: NOT DETECTED

## 2022-03-01 ENCOUNTER — Encounter: Payer: Self-pay | Admitting: Internal Medicine

## 2022-03-03 ENCOUNTER — Encounter: Payer: Self-pay | Admitting: Physician Assistant

## 2022-03-05 ENCOUNTER — Ambulatory Visit: Payer: BC Managed Care – PPO | Admitting: Family

## 2022-03-08 ENCOUNTER — Encounter: Payer: Self-pay | Admitting: Internal Medicine

## 2022-03-08 LAB — VITAMIN B1: Vitamin B1 (Thiamine): 15 nmol/L (ref 8–30)

## 2022-03-09 ENCOUNTER — Encounter: Payer: Self-pay | Admitting: Internal Medicine

## 2022-03-10 ENCOUNTER — Encounter: Payer: Self-pay | Admitting: Internal Medicine

## 2022-03-11 ENCOUNTER — Encounter: Payer: Self-pay | Admitting: Internal Medicine

## 2022-03-18 ENCOUNTER — Ambulatory Visit (INDEPENDENT_AMBULATORY_CARE_PROVIDER_SITE_OTHER): Payer: BC Managed Care – PPO

## 2022-03-18 ENCOUNTER — Ambulatory Visit: Payer: BC Managed Care – PPO | Admitting: Family

## 2022-03-18 ENCOUNTER — Encounter: Payer: Self-pay | Admitting: Family

## 2022-03-18 DIAGNOSIS — M25562 Pain in left knee: Secondary | ICD-10-CM | POA: Diagnosis not present

## 2022-03-18 DIAGNOSIS — M5416 Radiculopathy, lumbar region: Secondary | ICD-10-CM

## 2022-03-18 DIAGNOSIS — M2550 Pain in unspecified joint: Secondary | ICD-10-CM | POA: Diagnosis not present

## 2022-03-18 DIAGNOSIS — M791 Myalgia, unspecified site: Secondary | ICD-10-CM

## 2022-03-18 MED ORDER — PREDNISONE 50 MG PO TABS: ORAL_TABLET | ORAL | 0 refills | Status: DC

## 2022-03-18 NOTE — Progress Notes (Signed)
Office Visit Note   Patient: Jonathan Luna           Date of Birth: 1966-07-26           MRN: [id number] Visit Date: 03/18/2022              Requested by: Janith Lima, MD [address] [place],  [place] [address] PCP: Janith Lima, MD  Chief Complaint  Patient presents with   Left Knee - Pain      HPI: The patient is a 56 year old gentleman who presents today complaining of a 27-monthhistory of left knee pain.  He states this swells off and on he is unsure of any aggravating or relieving factors.  He complains of tightness and fullness behind his knee states this radiates up into his thigh as well as down into his calf.  Denies any injury no mechanical symptoms.  Endorses chronic low back pain as well.  Having burning anterior thigh pain this is associated with some intermittent numbness and tingling as well  Difficulty bearing weight due to pain in the left thigh and knee  He is quite concerned about a remote history of an event that happened at work where he tripped over a faucet and water leaked he states that this went on to get mold he was not properly cleaned he is concerned that he may have bacterial or fungal infection in his knee that is causing all of his symptoms. Requesting aspiration of the left knee.  Complains also of night sweats, muscle aches in upper back, neck, low back, "pecks". States feels as if he has done a hard work out. Has had these symptoms for last 5 months. States has discussed with his primary care provider.  Is concerned about mold illness as primary concern.  Assessment & Plan: Visit Diagnoses:  1. Lumbar back pain with radiculopathy affecting left lower extremity   2. Acute pain of left knee     Plan: We will draw an arthritis panel as well is a CBC today.  We will call him with the results considering referral to rheumatology.  Discussed his concern for mold illness and I recommended he follow-up with his primary care provider for this.   Low suspicion for infectious process in his right knee unable to aspirate any fluid today for culture.    Impression is left-sided lumbar radiculopathy we will provide him with a prednisone burst.  We will speak with him next week and see how he's doing.   Follow-Up Instructions: No follow-ups on file.   Left Knee Exam   Muscle Strength  The patient has normal left knee strength.  Tenderness  The patient is experiencing tenderness in the lateral joint line.  Range of Motion  The patient has normal left knee ROM.  Tests  Varus: negative Valgus: negative  Other  Erythema: absent Sensation: normal Swelling: none Effusion: no effusion present   Back Exam   Tenderness  The patient is experiencing no tenderness.   Range of Motion  The patient has normal back ROM.  Muscle Strength  The patient has normal back strength.  Other  Gait: normal      Patient is alert, oriented, no adenopathy, well-dressed, normal affect, normal respiratory effort.   Imaging: No results found. No images are attached to the encounter.  Labs: Lab Results  Component Value Date   HGBA1C 5.2 07/28/2012   CRP 1.5 02/25/2022     Lab Results  Component Value  Date   ALBUMIN 4.6 02/27/2022   ALBUMIN 4.5 10/23/2021   ALBUMIN 4.7 08/08/2020    No results found for: MG Lab Results  Component Value Date   VD25OH 43.36 08/08/2020   VD25OH 25.63 (L) 12/05/2019   VD25OH 32.65 03/14/2019    No results found for: PREALBUMIN    Latest Ref Rng & Units 02/27/2022   10:42 AM 02/25/2022    4:36 PM 10/23/2021    3:47 PM  CBC EXTENDED  WBC 4.0 - 10.5 K/uL 11.8   11.0   10.0    RBC 4.22 - 5.81 Mil/uL 4.62   4.24   4.71    Hemoglobin 13.0 - 17.0 g/dL 13.4   12.3   14.3    HCT 39.0 - 52.0 % 40.5   36.9   42.3    Platelets 150.0 - 400.0 K/uL 455.0   436.0   381.0    NEUT# 1.4 - 7.7 K/uL 8.7   6.5   5.8    Lymph# 0.7 - 4.0 K/uL 2.0   3.2   3.2       There is no height or weight on file to  calculate BMI.  Orders:  Orders Placed This Encounter  Procedures   XR Lumbar Spine 2-3 Views   No orders of the defined types were placed in this encounter.    Procedures: No procedures performed  Clinical Data: No additional findings.  ROS:  All other systems negative, except as noted in the HPI. Review of Systems  Constitutional:  Negative for chills and fever.  Cardiovascular:  Negative for leg swelling.  Musculoskeletal:  Positive for arthralgias, back pain, joint swelling and myalgias.  Skin:  Negative for color change and wound.  Neurological:  Positive for numbness. Negative for weakness.   Objective: Vital Signs: There were no vitals taken for this visit.  Specialty Comments:  No specialty comments available.  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   Low back pain 05/08/2016   Eczema, dyshidrotic 11/21/2013   Allergic rhinitis 12/13/2012   Chronic pain disorder 09/29/2012   ED (erectile dysfunction) 07/28/2012   Routine general medical examination at a health care facility 10/08/2011   Irritable bowel syndrome 10/16/2010   DDD (degenerative disc disease), lumbar 04/09/2010   Hypothyroidism 05/03/2009   Hyperlipidemia with target LDL less than 130 05/03/2009   Past Medical History:  Diagnosis Date   Allergy    Anxiety    Arthritis    Cataract    Depression    Heart murmur    History of  bilateral cataract extraction    Hyperlipidemia    Hypothyroidism    Low back pain    Panic attack    Panic attacks     Family History  Problem Relation Age of Onset   Hypertension Father    Heart disease Father    Breast cancer Mother    Cancer Mother    Diabetes Brother  Hyperlipidemia Brother     Past Surgical History:  Procedure Laterality Date   CATARACT EXTRACTION     EYE SURGERY     Social History   Occupational History   Not on file  Tobacco Use   Smoking status: Never   Smokeless tobacco: Never  Substance and Sexual Activity   Alcohol use: Yes    Alcohol/week: 2.0 standard drinks    Types: 2 Cans of beer per week   Drug use: No   Sexual activity: Yes    Birth control/protection: Condom

## 2022-03-19 LAB — ANA: Anti Nuclear Antibody (ANA): NEGATIVE

## 2022-03-19 LAB — SEDIMENTATION RATE: Sed Rate: 31 mm/h — ABNORMAL HIGH (ref 0–20)

## 2022-03-19 LAB — URIC ACID: Uric Acid, Serum: 5.3 mg/dL (ref 4.0–8.0)

## 2022-03-19 LAB — RHEUMATOID FACTOR: Rheumatoid fact SerPl-aCnc: <14 IU/mL (ref <14)

## 2022-03-20 ENCOUNTER — Emergency Department (HOSPITAL_COMMUNITY): Payer: BC Managed Care – PPO

## 2022-03-20 ENCOUNTER — Emergency Department (HOSPITAL_COMMUNITY)
Admission: EM | Admit: 2022-03-20 | Discharge: 2022-03-21 | Disposition: A | Discharge status: Home / Self Care | Payer: BC Managed Care – PPO | Attending: Emergency Medicine | Admitting: Emergency Medicine

## 2022-03-20 ENCOUNTER — Encounter (HOSPITAL_COMMUNITY): Payer: Self-pay

## 2022-03-20 ENCOUNTER — Telehealth: Payer: Self-pay | Admitting: Family

## 2022-03-20 ENCOUNTER — Other Ambulatory Visit: Payer: Self-pay

## 2022-03-20 DIAGNOSIS — D72829 Elevated white blood cell count, unspecified: Secondary | ICD-10-CM | POA: Diagnosis not present

## 2022-03-20 DIAGNOSIS — Z79899 Other long term (current) drug therapy: Secondary | ICD-10-CM | POA: Diagnosis not present

## 2022-03-20 DIAGNOSIS — E039 Hypothyroidism, unspecified: Secondary | ICD-10-CM | POA: Insufficient documentation

## 2022-03-20 DIAGNOSIS — R0789 Other chest pain: Secondary | ICD-10-CM | POA: Insufficient documentation

## 2022-03-20 DIAGNOSIS — F419 Anxiety disorder, unspecified: Secondary | ICD-10-CM | POA: Diagnosis not present

## 2022-03-20 DIAGNOSIS — R079 Chest pain, unspecified: Secondary | ICD-10-CM | POA: Diagnosis not present

## 2022-03-20 DIAGNOSIS — M2550 Pain in unspecified joint: Secondary | ICD-10-CM

## 2022-03-20 LAB — BASIC METABOLIC PANEL
Anion gap: 9 (ref 5–15)
BUN: 7 mg/dL (ref 6–20)
CO2: 24 mmol/L (ref 22–32)
Calcium: 9.4 mg/dL (ref 8.9–10.3)
Chloride: 104 mmol/L (ref 98–111)
Creatinine, Ser: 0.87 mg/dL (ref 0.61–1.24)
GFR, Estimated: >60 mL/min (ref >60)
Glucose, Bld: 104 mg/dL — ABNORMAL HIGH (ref 70–99)
Potassium: 3.8 mmol/L (ref 3.5–5.1)
Sodium: 137 mmol/L (ref 135–145)

## 2022-03-20 LAB — CBC
HCT: 40.3 % (ref 39.0–52.0)
Hemoglobin: 13 g/dL (ref 13.0–17.0)
MCH: 29 pg (ref 26.0–34.0)
MCHC: 32.3 g/dL (ref 30.0–36.0)
MCV: 90 fL (ref 80.0–100.0)
Platelets: 467 10*3/uL — ABNORMAL HIGH (ref 150–400)
RBC: 4.48 MIL/uL (ref 4.22–5.81)
RDW: 13 % (ref 11.5–15.5)
WBC: 13.4 10*3/uL — ABNORMAL HIGH (ref 4.0–10.5)
nRBC: 0 % (ref 0.0–0.2)

## 2022-03-20 LAB — TROPONIN I (HIGH SENSITIVITY)
Troponin I (High Sensitivity): 3 ng/L (ref <18)
Troponin I (High Sensitivity): 3 ng/L (ref <18)

## 2022-03-20 MED ORDER — HYDROXYZINE HCL 25 MG PO TABS: 25.0000 mg | ORAL_TABLET | Freq: Three times a day (TID) | ORAL | 0 refills | Status: DC | PRN

## 2022-03-20 MED ORDER — KETOROLAC TROMETHAMINE 15 MG/ML IJ SOLN: 15.0000 mg | Freq: Once | INTRAMUSCULAR | Status: DC

## 2022-03-20 MED ORDER — KETOROLAC TROMETHAMINE 15 MG/ML IJ SOLN
15.0000 mg | Freq: Once | INTRAMUSCULAR | Status: AC
  Administered 2022-03-20: 15 mg via INTRAMUSCULAR
  Filled 2022-03-20: qty 1

## 2022-03-20 NOTE — ED Provider Notes (Signed)
[facility] Provider Note   CSN: [id number] Arrival date & time: 03/20/22  1709     History {Add pertinent medical, surgical, social history, OB history to HPI:1} Chief Complaint  Patient presents with   Chest Pain    Jonathan Luna is a 56 y.o. male.  56 year old male presents today for patient of left-sided chest pain.  States this has been ongoing for 3 weeks slightly worse today.  Denies associated shortness of breath, lightheadedness, palpitations.  Recently had an inflammatory work-up which was significant for elevated ESR at 31.  States he is due to see gastroenterology for further work-up of his elevated inflammatory markers and some GI symptoms.  Without previous cardiac history.  The history is provided by the patient. No language interpreter was used.       Home Medications Prior to Admission medications   Medication Sig Start Date End Date Taking? Authorizing Provider  Ascorbic Acid (VITAMIN C PO) Take by mouth.    [provider]  Cholecalciferol 50 MCG (2000 UT) TABS Take 2 tablets (4,000 Units total) by mouth daily. 12/10/19   Janith Lima, MD  Ferric Maltol (ACCRUFER) 30 MG CAPS Take 1 capsule by mouth in the morning and at bedtime. 02/27/22   Janith Lima, MD  fluticasone (FLONASE) 50 MCG/ACT nasal spray Place 2 sprays into both nostrils daily. 12/05/19   Janith Lima, MD  irbesartan (AVAPRO) 300 MG tablet Take 1 tablet (300 mg total) by mouth daily. 10/24/21   Janith Lima, MD  levocetirizine (XYZAL) 5 MG tablet Take 1 tablet (5 mg total) by mouth every evening. 12/05/19   Janith Lima, MD  levothyroxine (SYNTHROID) 125 MCG tablet TAKE ONE TABLET BY MOUTH ONE TIME DAILY 02/06/22   Janith Lima, MD  Multiple Vitamins-Minerals (ZINC PO) Take by mouth.    [provider]  omega-3 acid ethyl esters (LOVAZA) 1 g capsule TAKE TWO CAPSULES BY MOUTH TWICE DAILY 08/20/21   Janith Lima, MD   oxyCODONE-acetaminophen (PERCOCET) 10-325 MG tablet Take 1 tablet by mouth every 8 (eight) hours as needed. for pain 02/25/22   Janith Lima, MD  predniSONE (DELTASONE) 50 MG tablet Take one tablet by mouth once daily for 5 days. 03/18/22   Suzan Slick, NP  Turmeric (QC TUMERIC COMPLEX PO) Take by mouth.    [provider]  XYOSTED 75 MG/0.5ML SOAJ Inject 75 mg into the skin once a week. 11/06/21   Janith Lima, MD      Allergies    Fluoxetine hcl and Venlafaxine    Review of Systems   Review of Systems  Constitutional:  Positive for chills (Chronic). Negative for fever and unexpected weight change.  Respiratory:  Positive for cough (Dry cough ongoing for months). Negative for shortness of breath.   Cardiovascular:  Positive for chest pain. Negative for palpitations and leg swelling.  Gastrointestinal:  Negative for abdominal pain, nausea and vomiting.  Neurological:  Negative for light-headedness.  All other systems reviewed and are negative.   Physical Exam Updated Vital Signs BP (!) 151/102 (BP Location: Right Arm)   Pulse 83   Temp 98 F (36.7 C) (Oral)   Resp 17   SpO2 99%  Physical Exam Vitals and nursing note reviewed.  Constitutional:      General: He is not in acute distress.    Appearance: Normal appearance. He is not ill-appearing.  HENT:     Head: Normocephalic  and atraumatic.     Nose: Nose normal.  Eyes:     General: No scleral icterus.    Extraocular Movements: Extraocular movements intact.     Conjunctiva/sclera: Conjunctivae normal.  Cardiovascular:     Rate and Rhythm: Normal rate and regular rhythm.     Pulses: Normal pulses.     Heart sounds: Normal heart sounds.  Pulmonary:     Effort: Pulmonary effort is normal. No respiratory distress.     Breath sounds: Normal breath sounds. No wheezing or rales.  Abdominal:     General: There is no distension.     Tenderness: There is no abdominal tenderness.  Musculoskeletal:        General:  Normal range of motion.     Cervical back: Normal range of motion.  Skin:    General: Skin is warm and dry.  Neurological:     General: No focal deficit present.     Mental Status: He is alert. Mental status is at baseline.     ED Results / Procedures / Treatments   Labs (all labs ordered are listed, but only abnormal results are displayed) Labs Reviewed  BASIC METABOLIC PANEL - Abnormal; Notable for the following components:      Result Value   Glucose, Bld 104 (*)    All other components within normal limits  CBC - Abnormal; Notable for the following components:   WBC 13.4 (*)    Platelets 467 (*)    All other components within normal limits  TROPONIN I (HIGH SENSITIVITY)  TROPONIN I (HIGH SENSITIVITY)    EKG EKG Interpretation  Date/Time:  Thursday March 20 2022 17:21:29 EDT Ventricular Rate:  108 PR Interval:  154 QRS Duration: 84 QT Interval:  342 QTC Calculation: 458 R Axis:   77 Text Interpretation: Sinus tachycardia Otherwise normal ECG No previous ECGs available Confirmed by Lennice Sites ([id number]) on 03/20/2022 9:22:55 PM  Radiology DG Chest 2 View  Result Date: 03/20/2022 CLINICAL DATA:  Chest pain EXAM: CHEST - 2 VIEW COMPARISON:  Radiograph Feb 27, 2022 FINDINGS: The heart size and mediastinal contours are within normal limits. No focal airspace consolidation. No pleural effusion. No pneumothorax. Thoracic spondylosis. IMPRESSION: No acute cardiopulmonary disease. Electronically Signed   By: Dahlia Bailiff M.D.   On: 03/20/2022 18:56    Procedures Procedures  {Document cardiac monitor, telemetry assessment procedure when appropriate:1}  Medications Ordered in ED Medications - No data to display  ED Course/ Medical Decision Making/ A&P                           Medical Decision Making Amount and/or Complexity of Data Reviewed Labs: ordered. Radiology: ordered.  Risk Prescription drug management.   Medical Decision Making / ED Course   This patient  presents to the ED for concern of ***, this involves an extensive number of treatment options, and is a complaint that carries with it a high risk of complications and morbidity.  The differential diagnosis includes ***  MDM: 56 year old male presents today for evaluation of chest pain.  Ongoing for 3 weeks somewhat worse today.  States it feels like his muscles are sore and he does have a workout.  Denies shortness of breath, lightheadedness, palpitations, or other complaints.  He is resting comfortably without acute distress during my interview.  Chest x-ray without acute cardiopulmonary process.  EKG without acute ischemic changes.  Initial troponin of 4.  CBC with mild leukocytosis  of 13.4.  He does have a chronic dry cough.  This is nonproductive and no evidence of pneumonia on chest x-ray.  BMP without acute findings.  Repeat troponin of 3.  EKG without acute ischemic changes.   Additional history obtained: -Additional history obtained from *** -External records from outside source obtained and reviewed including: Chart review including previous notes, labs, imaging, consultation notes   Lab Tests: -I ordered, reviewed, and interpreted labs.   The pertinent results include:   Labs Reviewed  BASIC METABOLIC PANEL - Abnormal; Notable for the following components:      Result Value   Glucose, Bld 104 (*)    All other components within normal limits  CBC - Abnormal; Notable for the following components:   WBC 13.4 (*)    Platelets 467 (*)    All other components within normal limits  TROPONIN I (HIGH SENSITIVITY)  TROPONIN I (HIGH SENSITIVITY)      EKG  EKG Interpretation  Date/Time:  Thursday March 20 2022 17:21:29 EDT Ventricular Rate:  108 PR Interval:  154 QRS Duration: 84 QT Interval:  342 QTC Calculation: 458 R Axis:   77 Text Interpretation: Sinus tachycardia Otherwise normal ECG No previous ECGs available Confirmed by Lennice Sites ([id number]) on 03/20/2022 9:22:55 PM          Imaging Studies ordered: I ordered imaging studies including *** I independently visualized and interpreted imaging. I agree with the radiologist interpretation   Medicines ordered and prescription drug management: Meds ordered this encounter  Medications   ketorolac (TORADOL) 15 MG/ML injection 15 mg    -I have reviewed the patients home medicines and have made adjustments as needed  Critical interventions ***  Consultations Obtained: I requested consultation with the ***,  and discussed lab and imaging findings as well as pertinent plan - they recommend: ***   Cardiac Monitoring: The patient was maintained on a cardiac monitor.  I personally viewed and interpreted the cardiac monitored which showed an underlying rhythm of: ***  Social Determinants of Health:  Factors impacting patients care include: ***   Reevaluation: After the interventions noted above, I reevaluated the patient and found that they have :{resolved/improved/worsened:23923::"improved"}  Co morbidities that complicate the patient evaluation  Past Medical History:  Diagnosis Date   Allergy    Anxiety    Arthritis    Cataract    Depression    Heart murmur    History of bilateral cataract extraction    Hyperlipidemia    Hypothyroidism    Low back pain    Panic attack    Panic attacks       Dispostion: ***   Final Clinical Impression(s) / ED Diagnoses Final diagnoses:  Chest wall pain  Anxiety    Rx / DC Orders ED Discharge Orders          Ordered    hydrOXYzine (ATARAX) 25 MG tablet  Every 8 hours PRN        03/20/22 2354

## 2022-03-20 NOTE — Telephone Encounter (Signed)
Jonathan Luna has received his blood test results on MyChart. He is concerned & would like to speak with Junie Panning about the findings. Patient is having slight SOB, coughing and night sweats. Please advise!

## 2022-03-20 NOTE — Discharge Instructions (Addendum)
Your work-up today was reassuring.  Your heart enzymes and chest x-ray were normal.  You received a dose of Toradol in the emergency room.  I have sent antibiotics into the pharmacy for you after our discussion.  Follow-up with your primary care doctor regarding your anxiety and your chest pain.  Continue your outpatient work-up regarding the elevated inflammatory marker.  You requested you may want to switch your primary care provider.  I have attached [facility] if you so choose to establish a new primary care provider.

## 2022-03-20 NOTE — ED Notes (Signed)
Patient weight was 199.5 lbs

## 2022-03-20 NOTE — ED Triage Notes (Signed)
Complains of pain in chest for the past few days and reports it goes into his back.  Reports his inflammation marker is elevated.  Patient complains of SOB.  Patient reports he was exposed to mold and wants his blood checked for bacteria. Patient appears anxious.  Patient rambling on about urinating ok and diarrhea and more on inflammation all over his body.

## 2022-03-20 NOTE — ED Provider Triage Note (Signed)
Emergency Medicine Provider Triage Evaluation Note  AVEION NGUYEN , a 56 y.o. male  was evaluated in triage.  Pt complains of chest pain.  Is been going on since at least Tuesday but worsened today.  Left-sided initially but now right-sided and radiates to her back..  Review of Systems  Per HPI  Physical Exam  BP (!) 171/92 (BP Location: Right Arm)   Pulse 100   Temp 98.5 F (36.9 C) (Oral)   Resp 18   SpO2 97%  Gen:   Awake, no distress   Resp:  Normal effort  MSK:   Moves extremities without difficulty  Other:    Medical Decision Making  Medically screening exam initiated at 5:35 PM.  Appropriate orders placed.  ROMA BIERLEIN was informed that the remainder of the evaluation will be completed by another provider, this initial triage assessment does not replace that evaluation, and the importance of remaining in the ED until their evaluation is complete.     Sherrill Raring, PA-C 03/20/22 1736

## 2022-03-21 ENCOUNTER — Other Ambulatory Visit: Payer: Self-pay | Admitting: Internal Medicine

## 2022-03-21 DIAGNOSIS — R058 Other specified cough: Secondary | ICD-10-CM

## 2022-03-23 ENCOUNTER — Encounter: Payer: Self-pay | Admitting: Internal Medicine

## 2022-03-24 NOTE — Telephone Encounter (Signed)
Key: [id number]

## 2022-03-24 NOTE — Telephone Encounter (Signed)
Approved 03/24/2022 - 04/22/2022.

## 2022-03-25 ENCOUNTER — Encounter: Payer: Self-pay | Admitting: Physician Assistant

## 2022-03-25 ENCOUNTER — Ambulatory Visit: Payer: BC Managed Care – PPO | Admitting: Physician Assistant

## 2022-03-25 VITALS — BP 122/80 | HR 96 | Ht 66.0 in | Wt 198.5 lb

## 2022-03-25 DIAGNOSIS — K219 Gastro-esophageal reflux disease without esophagitis: Secondary | ICD-10-CM | POA: Diagnosis not present

## 2022-03-25 DIAGNOSIS — R194 Change in bowel habit: Secondary | ICD-10-CM | POA: Diagnosis not present

## 2022-03-25 DIAGNOSIS — D509 Iron deficiency anemia, unspecified: Secondary | ICD-10-CM

## 2022-03-25 MED ORDER — OMEPRAZOLE 40 MG PO CPDR: 40.0000 mg | DELAYED_RELEASE_CAPSULE | Freq: Every day | ORAL | 3 refills | Status: DC

## 2022-03-25 NOTE — Patient Instructions (Addendum)
If you are age 56 or younger, your body mass index should be between 19-25. Your Body mass index is 32.04 kg/m. If this is out of the aformentioned range listed, please consider follow up with your Primary Care Provider.  ________________________________________________________  The [facility] providers would like to encourage you to use [facility] to communicate with providers for non-urgent requests or questions.  Due to long hold times on the telephone, sending your provider a message by [facility] may be a faster and more efficient way to get a response.  Please allow 48 business hours for a response.  Please remember that this is for non-urgent requests.  _______________________________________________________  START Omeprazole 40 mg 1 capsule daily  Call the office and schedule 1 month follow up.  Thank you for entrusting me with your care and choosing [facility].  Ellouise Newer, PA-C

## 2022-03-25 NOTE — Progress Notes (Signed)
Chief Complaint: Discuss colonoscopy, diarrhea and abdominal discomfort  HPI:    Jonathan Luna is a 56 year old male with a past medical history as listed below, who was referred to me by Janith Lima, MD for a complaint of diarrhea and abdominal discomfort and also to discuss a colonoscopy.      03/10/2017 patient reported history of a colonoscopy.    02/27/2022 CBC with hemoglobin of 13.4.  Iron panel and iron low at 35% saturation low at 11.1.    03/18/2022 ESR elevated at 31, ANA negative.    03/20/2022 ER visit for atypical chest pain with a glucose of 104 and otherwise normal BMP and CBC with a white count elevated at 13.4 and a normal hemoglobin.  Troponins negative.  EKG normal.  Chest x-ray normal.  His symptoms improved with Toradol.    Today, the patient tells me that he has been all worked up over the past couple of months because various physicians have found things wrong with him.  Discusses that he was told that he was iron deficient but no longer anemic since his visit to the ER.  He has been on oral iron supplementation now for 3 weeks.  Tells me he has never seen any blood in his stool.  Also discusses some indigestion and this has increased over the past few weeks and he has some softer than normal stool off and on.  Also tells me that sometimes he feels like his food is just not digesting that well.  He does take over-the-counter Prilosec as needed which is not very often.  He is also on turmeric supplement and currently taking Prednisone.  Also describes a dry cough in the morning upon waking.    Also describes some swelling in his knees and has been following with his PCP about this, apparently testing for "arthritis" was negative.    Patient significant other tells me he has been having a hard time sleeping as he is very stressed about all the testing that has been done and has been googling things online.  Does describe some night sweats off-and-on.    Denies fever, chills, blood in  his stool, abdominal pain or symptoms that awaken him from sleep.  Past Medical History:  Diagnosis Date   Allergy    Anxiety    Arthritis    Cataract    Depression    Heart murmur    History of bilateral cataract extraction    Hyperlipidemia    Hypothyroidism    Low back pain    Panic attack    Panic attacks     Past Surgical History:  Procedure Laterality Date   CATARACT EXTRACTION     EYE SURGERY      Current Outpatient Medications  Medication Sig Dispense Refill   Ascorbic Acid (VITAMIN C PO) Take by mouth.     Cholecalciferol 50 MCG (2000 UT) TABS Take 2 tablets (4,000 Units total) by mouth daily. 180 tablet 1   Ferric Maltol (ACCRUFER) 30 MG CAPS Take 1 capsule by mouth in the morning and at bedtime. 180 capsule 1   fluticasone (FLONASE) 50 MCG/ACT nasal spray Place 2 sprays into both nostrils daily. 48 g 1   hydrOXYzine (ATARAX) 25 MG tablet Take 1 tablet (25 mg total) by mouth every 8 (eight) hours as needed. 30 tablet 0   irbesartan (AVAPRO) 300 MG tablet Take 1 tablet (300 mg total) by mouth daily. 90 tablet 1   levocetirizine (XYZAL) 5 MG  tablet Take 1 tablet (5 mg total) by mouth every evening. 90 tablet 1   levothyroxine (SYNTHROID) 125 MCG tablet TAKE ONE TABLET BY MOUTH ONE TIME DAILY 90 tablet 0   Multiple Vitamins-Minerals (ZINC PO) Take by mouth.     omega-3 acid ethyl esters (LOVAZA) 1 g capsule TAKE TWO CAPSULES BY MOUTH TWICE DAILY 360 capsule 0   oxyCODONE-acetaminophen (PERCOCET) 10-325 MG tablet Take 1 tablet by mouth every 8 (eight) hours as needed. for pain 90 tablet 0   predniSONE (DELTASONE) 50 MG tablet Take one tablet by mouth once daily for 5 days. 5 tablet 0   Turmeric (QC TUMERIC COMPLEX PO) Take by mouth.     XYOSTED 75 MG/0.5ML SOAJ Inject 75 mg into the skin once a week. 6 mL 1   No current facility-administered medications for this visit.    Allergies as of 03/25/2022 - Review Complete 03/25/2022  Allergen Reaction Noted   Fluoxetine  hcl  04/29/2007   Venlafaxine  04/29/2007    Family History  Problem Relation Age of Onset   Hypertension Father    Heart disease Father    Breast cancer Mother    Cancer Mother    Diabetes Brother    Hyperlipidemia Brother     Social History   Socioeconomic History   Marital status: Married    Spouse name: Not on file   Number of children: Not on file   Years of education: Not on file   Highest education level: Not on file  Occupational History   Not on file  Tobacco Use   Smoking status: Never   Smokeless tobacco: Never  Substance and Sexual Activity   Alcohol use: Yes    Alcohol/week: 2.0 standard drinks of alcohol    Types: 2 Cans of beer per week   Drug use: No   Sexual activity: Yes    Birth control/protection: Condom  Other Topics Concern   Not on file  Social History Narrative   Caffienated drinks-no   Seat belt use often-yes   Regular Exercise-yes   Smoke alarm in the home-yes   Firearms/guns in the home-no   History of physical abuse-no               Social Determinants of Health   Financial Resource Strain: Not on file  Food Insecurity: Not on file  Transportation Needs: Not on file  Physical Activity: Not on file  Stress: Not on file  Social Connections: Not on file  Intimate Partner Violence: Not on file    Review of Systems:    Constitutional: No fever or chills Skin: No rash Cardiovascular: No chest pain Respiratory: No SOB  Gastrointestinal: See HPI and otherwise negative Genitourinary: No dysuria  Neurological: No headache, dizziness or syncope Musculoskeletal: No new muscle or joint pain Hematologic: No bleeding  Psychiatric: +anxiety   Physical Exam:  Vital signs: BP 122/80 (BP Location: Left Arm, Patient Position: Sitting, Cuff Size: Normal)   Pulse 96   Ht 5' 6"  (1.676 m) Comment: height measured without shoes  Wt 198 lb 8 oz (90 kg)   BMI 32.04 kg/m    Constitutional:   Pleasant Caucasian male appears to be in NAD,  Well developed, Well nourished, alert and cooperative Head:  Normocephalic and atraumatic. Eyes:   PEERL, EOMI. No icterus. Conjunctiva pink. Ears:  Normal auditory acuity. Neck:  Supple Throat: Oral cavity and pharynx without inflammation, swelling or lesion.  Respiratory: Respirations even and unlabored. Lungs clear to  auscultation bilaterally.   No wheezes, crackles, or rhonchi.  Cardiovascular: Normal S1, S2. No MRG. Regular rate and rhythm. No peripheral edema, cyanosis or pallor.  Gastrointestinal:  Soft, nondistended, nontender. No rebound or guarding. Normal bowel sounds. No appreciable masses or hepatomegaly. Rectal:  Not performed.  Msk:  Symmetrical without gross deformities. Without edema, no deformity or joint abnormality.  Neurologic:  Alert and  oriented x4;  grossly normal neurologically.  Skin:   Dry and intact without significant lesions or rashes. Psychiatric: Demonstrates good judgement and reason without abnormal affect or behaviors.  RELEVANT LABS AND IMAGING: CBC    Component Value Date/Time   WBC 13.4 (H) 03/20/2022 1744   RBC 4.48 03/20/2022 1744   HGB 13.0 03/20/2022 1744   HCT 40.3 03/20/2022 1744   PLT 467 (H) 03/20/2022 1744   MCV 90.0 03/20/2022 1744   MCH 29.0 03/20/2022 1744   MCHC 32.3 03/20/2022 1744   RDW 13.0 03/20/2022 1744   LYMPHSABS 2.0 02/27/2022 1042   MONOABS 0.9 02/27/2022 1042   EOSABS 0.1 02/27/2022 1042   BASOSABS 0.1 02/27/2022 1042    CMP     Component Value Date/Time   NA 137 03/20/2022 1744   K 3.8 03/20/2022 1744   CL 104 03/20/2022 1744   CO2 24 03/20/2022 1744   GLUCOSE 104 (H) 03/20/2022 1744   BUN 7 03/20/2022 1744   CREATININE 0.87 03/20/2022 1744   CALCIUM 9.4 03/20/2022 1744   PROT 8.1 02/27/2022 1042   ALBUMIN 4.6 02/27/2022 1042   AST 14 02/27/2022 1042   ALT 17 02/27/2022 1042   ALKPHOS 73 02/27/2022 1042   BILITOT 0.5 02/27/2022 1042   GFRNONAA >60 03/20/2022 1744   GFRAA 94 08/16/2008 0844     Assessment: 1.  GERD: Most days experiences indigestion and a full feeling; likely gastritis +/- functional dyspepsia with excess stress and anxiety 2.  Iron deficiency: Following with PCP in regards to this, hemoglobin is now normal after 2 to 3 weeks of oral iron supplementation, patient tells me he was not eating well; consider relation to diet most likely but if does not improve will need further investigation 3.  Change in bowel habits: Some softer than normal stools and occasional loose stools off-and-on over the past few months with indigestion and excess stress; consider gastritis +/- IBS  Plan: 1.  Tried to reassure the patient.  He is no longer anemic after being on an iron supplement for only a few weeks.  This is a good sign.  He is not seeing any blood in his stool.  At this point would recommend rechecking his iron studies in a couple of months.  His PCP can continue to follow this as they are doing.  If he remains anemic or the iron supplementation is not working then may need to consider sooner endoscopic work-up. 2.  Discussed swelling in patient's knees.  He would likely benefit from referral to an orthopedic doctor for further evaluation.  He can continue to follow with his PCP about this. 3.  Discussed indigestion.  Will start Omeprazole 40 mg every morning, 30 minutes before breakfast #30 with 5 refills. 4.  We will request records from [place] in regards to patient's last colonoscopy.  Pending review of these patient may need repeat colonoscopy. 5.  Patient will follow in clinic with me in a month to see how he is doing.  Hopefully we will have his records by then, if no improvement will then need to  discuss EGD and colonoscopy.  He was assigned to Dr. Rush Landmark.  Jonathan Newer, PA-C [facility] 03/25/2022, 3:07 PM  Cc: Janith Lima, MD

## 2022-03-26 ENCOUNTER — Other Ambulatory Visit: Payer: Self-pay | Admitting: Internal Medicine

## 2022-03-26 DIAGNOSIS — G8929 Other chronic pain: Secondary | ICD-10-CM

## 2022-03-26 DIAGNOSIS — M5136 Other intervertebral disc degeneration, lumbar region: Secondary | ICD-10-CM

## 2022-03-26 NOTE — Progress Notes (Signed)
Attending Physician's Attestation   I have reviewed the chart.   I agree with the Advanced Practitioner's note, impression, and recommendations with any updates as below. Happy to hear iron deficiency has improved.  Close monitoring is reasonable and follow-up of the previously done work-up.  If symptoms persist or IDA recurs, EGD/colonoscopy at minimum.   Justice Britain, MD [facility] Advanced Endoscopy Office # [phone]

## 2022-03-27 MED ORDER — OXYCODONE-ACETAMINOPHEN 10-325 MG PO TABS: 1.0000 | ORAL_TABLET | Freq: Three times a day (TID) | ORAL | 0 refills | Status: DC | PRN

## 2022-04-03 ENCOUNTER — Encounter: Payer: Self-pay | Admitting: Internal Medicine

## 2022-04-04 ENCOUNTER — Ambulatory Visit: Payer: BC Managed Care – PPO | Admitting: Nurse Practitioner

## 2022-04-07 ENCOUNTER — Encounter: Payer: Self-pay | Admitting: Internal Medicine

## 2022-04-07 ENCOUNTER — Ambulatory Visit: Payer: BC Managed Care – PPO | Admitting: Internal Medicine

## 2022-04-07 VITALS — BP 128/74 | HR 100 | Temp 97.8°F | Ht 66.0 in | Wt 198.0 lb

## 2022-04-07 DIAGNOSIS — D508 Other iron deficiency anemias: Secondary | ICD-10-CM | POA: Diagnosis not present

## 2022-04-07 DIAGNOSIS — F410 Panic disorder [episodic paroxysmal anxiety] without agoraphobia: Secondary | ICD-10-CM

## 2022-04-07 DIAGNOSIS — Z79891 Long term (current) use of opiate analgesic: Secondary | ICD-10-CM

## 2022-04-07 DIAGNOSIS — M542 Cervicalgia: Secondary | ICD-10-CM

## 2022-04-07 DIAGNOSIS — E785 Hyperlipidemia, unspecified: Secondary | ICD-10-CM | POA: Diagnosis not present

## 2022-04-07 DIAGNOSIS — I10 Essential (primary) hypertension: Secondary | ICD-10-CM

## 2022-04-07 DIAGNOSIS — R0782 Intercostal pain: Secondary | ICD-10-CM

## 2022-04-07 DIAGNOSIS — F1010 Alcohol abuse, uncomplicated: Secondary | ICD-10-CM

## 2022-04-07 DIAGNOSIS — J4540 Moderate persistent asthma, uncomplicated: Secondary | ICD-10-CM

## 2022-04-07 DIAGNOSIS — R053 Chronic cough: Secondary | ICD-10-CM

## 2022-04-07 LAB — LIPID PANEL
Cholesterol: 203 mg/dL — ABNORMAL HIGH (ref 0–200)
HDL: 38.7 mg/dL — ABNORMAL LOW (ref >39.00)
NonHDL: 163.86
Total CHOL/HDL Ratio: 5
Triglycerides: 206 mg/dL — ABNORMAL HIGH (ref 0.0–149.0)
VLDL: 41.2 mg/dL — ABNORMAL HIGH (ref 0.0–40.0)

## 2022-04-07 LAB — CBC WITH DIFFERENTIAL/PLATELET
Basophils Absolute: 0.1 10*3/uL (ref 0.0–0.1)
Basophils Relative: 0.7 % (ref 0.0–3.0)
Eosinophils Absolute: 0.2 10*3/uL (ref 0.0–0.7)
Eosinophils Relative: 1.9 % (ref 0.0–5.0)
HCT: 42.8 % (ref 39.0–52.0)
Hemoglobin: 14 g/dL (ref 13.0–17.0)
Lymphocytes Relative: 28.1 % (ref 12.0–46.0)
Lymphs Abs: 3.2 10*3/uL (ref 0.7–4.0)
MCHC: 32.8 g/dL (ref 30.0–36.0)
MCV: 87.6 fl (ref 78.0–100.0)
Monocytes Absolute: 0.8 10*3/uL (ref 0.1–1.0)
Monocytes Relative: 7.4 % (ref 3.0–12.0)
Neutro Abs: 7 10*3/uL (ref 1.4–7.7)
Neutrophils Relative %: 61.9 % (ref 43.0–77.0)
Platelets: 403 10*3/uL — ABNORMAL HIGH (ref 150.0–400.0)
RBC: 4.88 Mil/uL (ref 4.22–5.81)
RDW: 13.8 % (ref 11.5–15.5)
WBC: 11.4 10*3/uL — ABNORMAL HIGH (ref 4.0–10.5)

## 2022-04-07 LAB — LDL CHOLESTEROL, DIRECT: Direct LDL: 150 mg/dL

## 2022-04-07 MED ORDER — CLONAZEPAM 0.5 MG PO TABS: 0.5000 mg | ORAL_TABLET | Freq: Three times a day (TID) | ORAL | 0 refills | Status: DC | PRN

## 2022-04-07 MED ORDER — TRAZODONE HCL 100 MG PO TABS: 100.0000 mg | ORAL_TABLET | Freq: Every evening | ORAL | 0 refills | Status: DC | PRN

## 2022-04-07 MED ORDER — TRELEGY ELLIPTA 100-62.5-25 MCG/ACT IN AEPB: 1.0000 | INHALATION_SPRAY | Freq: Every day | RESPIRATORY_TRACT | 0 refills | Status: DC

## 2022-04-07 MED ORDER — BUSPIRONE HCL 5 MG PO TABS: 5.0000 mg | ORAL_TABLET | Freq: Three times a day (TID) | ORAL | 0 refills | Status: DC

## 2022-04-09 LAB — URINE DRUGS OF ABUSE SCREEN W ALC, ROUTINE (REF LAB)
Amphetamines, Urine: NEGATIVE ng/mL
Barbiturate Quant, Ur: NEGATIVE ng/mL
Benzodiazepine Quant, Ur: NEGATIVE ng/mL
Cannabinoid Quant, Ur: NEGATIVE ng/mL
Cocaine (Metab.): NEGATIVE ng/mL
Ethanol, Urine: NEGATIVE %
Methadone Screen, Urine: NEGATIVE ng/mL
Opiate Quant, Ur: NEGATIVE ng/mL
PCP Quant, Ur: NEGATIVE ng/mL
Propoxyphene: NEGATIVE ng/mL

## 2022-04-11 ENCOUNTER — Encounter: Payer: Self-pay | Admitting: Internal Medicine

## 2022-04-20 ENCOUNTER — Encounter: Payer: Self-pay | Admitting: Internal Medicine

## 2022-04-24 NOTE — Telephone Encounter (Signed)
Not sure who done PA W/  (Key: [id number]). on MY COVER-MY-MEDS stating was APPROVED " oxyCODONE-Acetaminophen 10-'325MG'$ "  Effective from 03/24/2022 through 04/22/2022. Faxed approval to pof and sent pt msg../lmb

## 2022-04-24 NOTE — Telephone Encounter (Signed)
Pt sent msg about PA for pain medicine. I don't see any pain meds on  his list. Do u know which pain med??

## 2022-04-25 ENCOUNTER — Other Ambulatory Visit: Payer: Self-pay | Admitting: Internal Medicine

## 2022-04-25 DIAGNOSIS — M5136 Other intervertebral disc degeneration, lumbar region: Secondary | ICD-10-CM

## 2022-04-25 DIAGNOSIS — G8929 Other chronic pain: Secondary | ICD-10-CM

## 2022-04-25 DIAGNOSIS — G894 Chronic pain syndrome: Secondary | ICD-10-CM

## 2022-04-25 MED ORDER — OXYCODONE-ACETAMINOPHEN 5-325 MG PO TABS: 1.0000 | ORAL_TABLET | Freq: Three times a day (TID) | ORAL | 0 refills | Status: AC | PRN

## 2022-04-28 ENCOUNTER — Ambulatory Visit: Payer: BC Managed Care – PPO | Admitting: Internal Medicine

## 2022-04-30 ENCOUNTER — Encounter: Payer: Self-pay | Admitting: Internal Medicine

## 2022-04-30 ENCOUNTER — Ambulatory Visit: Payer: BC Managed Care – PPO | Admitting: Physician Assistant

## 2022-05-05 ENCOUNTER — Other Ambulatory Visit: Payer: Self-pay | Admitting: Internal Medicine

## 2022-05-05 DIAGNOSIS — F410 Panic disorder [episodic paroxysmal anxiety] without agoraphobia: Secondary | ICD-10-CM

## 2022-05-05 MED ORDER — CLONAZEPAM 0.5 MG PO TABS: 0.5000 mg | ORAL_TABLET | Freq: Three times a day (TID) | ORAL | 2 refills | Status: DC | PRN

## 2022-05-06 ENCOUNTER — Ambulatory Visit
Admission: RE | Admit: 2022-05-06 | Discharge: 2022-05-06 | Disposition: A | Discharge status: Home / Self Care | Payer: BC Managed Care – PPO | Source: Ambulatory Visit | Attending: Internal Medicine | Admitting: Internal Medicine

## 2022-05-06 DIAGNOSIS — M542 Cervicalgia: Secondary | ICD-10-CM

## 2022-05-06 DIAGNOSIS — R0782 Intercostal pain: Secondary | ICD-10-CM

## 2022-05-06 DIAGNOSIS — J341 Cyst and mucocele of nose and nasal sinus: Secondary | ICD-10-CM | POA: Diagnosis not present

## 2022-05-06 DIAGNOSIS — M5030 Other cervical disc degeneration, unspecified cervical region: Secondary | ICD-10-CM | POA: Diagnosis not present

## 2022-05-06 DIAGNOSIS — R053 Chronic cough: Secondary | ICD-10-CM

## 2022-05-06 DIAGNOSIS — R1310 Dysphagia, unspecified: Secondary | ICD-10-CM | POA: Diagnosis not present

## 2022-05-06 DIAGNOSIS — R590 Localized enlarged lymph nodes: Secondary | ICD-10-CM | POA: Diagnosis not present

## 2022-05-06 DIAGNOSIS — R062 Wheezing: Secondary | ICD-10-CM | POA: Diagnosis not present

## 2022-05-06 DIAGNOSIS — R059 Cough, unspecified: Secondary | ICD-10-CM | POA: Diagnosis not present

## 2022-05-06 DIAGNOSIS — R079 Chest pain, unspecified: Secondary | ICD-10-CM | POA: Diagnosis not present

## 2022-05-06 MED ORDER — IOPAMIDOL (ISOVUE-300) INJECTION 61%
100.0000 mL | Freq: Once | INTRAVENOUS | Status: AC | PRN
  Administered 2022-05-06: 100 mL via INTRAVENOUS

## 2022-05-14 ENCOUNTER — Other Ambulatory Visit: Payer: Self-pay | Admitting: Internal Medicine

## 2022-05-14 DIAGNOSIS — E039 Hypothyroidism, unspecified: Secondary | ICD-10-CM

## 2022-05-18 ENCOUNTER — Other Ambulatory Visit: Payer: Self-pay | Admitting: Internal Medicine

## 2022-05-18 DIAGNOSIS — E039 Hypothyroidism, unspecified: Secondary | ICD-10-CM

## 2022-05-20 ENCOUNTER — Other Ambulatory Visit: Payer: Self-pay | Admitting: Internal Medicine

## 2022-05-20 DIAGNOSIS — E039 Hypothyroidism, unspecified: Secondary | ICD-10-CM

## 2022-05-25 ENCOUNTER — Encounter: Payer: Self-pay | Admitting: Internal Medicine

## 2022-05-26 ENCOUNTER — Other Ambulatory Visit: Payer: Self-pay | Admitting: Internal Medicine

## 2022-05-26 DIAGNOSIS — G894 Chronic pain syndrome: Secondary | ICD-10-CM

## 2022-05-26 DIAGNOSIS — M5136 Other intervertebral disc degeneration, lumbar region: Secondary | ICD-10-CM

## 2022-05-26 DIAGNOSIS — G8929 Other chronic pain: Secondary | ICD-10-CM

## 2022-05-26 MED ORDER — OXYCODONE-ACETAMINOPHEN 5-325 MG PO TABS: 1.0000 | ORAL_TABLET | Freq: Three times a day (TID) | ORAL | 0 refills | Status: DC | PRN

## 2022-05-29 ENCOUNTER — Other Ambulatory Visit: Payer: Self-pay | Admitting: Internal Medicine

## 2022-05-29 DIAGNOSIS — I10 Essential (primary) hypertension: Secondary | ICD-10-CM

## 2022-05-29 DIAGNOSIS — G894 Chronic pain syndrome: Secondary | ICD-10-CM

## 2022-05-29 DIAGNOSIS — M5136 Other intervertebral disc degeneration, lumbar region: Secondary | ICD-10-CM

## 2022-05-29 DIAGNOSIS — G8929 Other chronic pain: Secondary | ICD-10-CM

## 2022-05-29 MED ORDER — OXYCODONE-ACETAMINOPHEN 5-325 MG PO TABS: 1.0000 | ORAL_TABLET | Freq: Three times a day (TID) | ORAL | 0 refills | Status: DC | PRN

## 2022-06-02 ENCOUNTER — Other Ambulatory Visit: Payer: Self-pay | Admitting: Internal Medicine

## 2022-06-02 DIAGNOSIS — E781 Pure hyperglyceridemia: Secondary | ICD-10-CM

## 2022-06-04 ENCOUNTER — Other Ambulatory Visit: Payer: Self-pay | Admitting: Internal Medicine

## 2022-06-04 DIAGNOSIS — E781 Pure hyperglyceridemia: Secondary | ICD-10-CM

## 2022-06-25 ENCOUNTER — Other Ambulatory Visit: Payer: Self-pay | Admitting: Internal Medicine

## 2022-06-25 DIAGNOSIS — G894 Chronic pain syndrome: Secondary | ICD-10-CM

## 2022-06-25 DIAGNOSIS — M545 Low back pain, unspecified: Secondary | ICD-10-CM

## 2022-06-25 DIAGNOSIS — M5136 Other intervertebral disc degeneration, lumbar region: Secondary | ICD-10-CM

## 2022-06-26 ENCOUNTER — Ambulatory Visit: Payer: BC Managed Care – PPO | Admitting: Internal Medicine

## 2022-06-26 ENCOUNTER — Encounter: Payer: Self-pay | Admitting: Internal Medicine

## 2022-06-26 VITALS — BP 152/92 | HR 82 | Temp 98.1°F | Resp 16 | Ht 66.0 in | Wt 205.0 lb

## 2022-06-26 DIAGNOSIS — G8929 Other chronic pain: Secondary | ICD-10-CM | POA: Diagnosis not present

## 2022-06-26 DIAGNOSIS — G894 Chronic pain syndrome: Secondary | ICD-10-CM | POA: Diagnosis not present

## 2022-06-26 DIAGNOSIS — M5136 Other intervertebral disc degeneration, lumbar region: Secondary | ICD-10-CM | POA: Diagnosis not present

## 2022-06-26 DIAGNOSIS — E781 Pure hyperglyceridemia: Secondary | ICD-10-CM

## 2022-06-26 DIAGNOSIS — Z23 Encounter for immunization: Secondary | ICD-10-CM

## 2022-06-26 DIAGNOSIS — Z79891 Long term (current) use of opiate analgesic: Secondary | ICD-10-CM

## 2022-06-26 DIAGNOSIS — M545 Low back pain, unspecified: Secondary | ICD-10-CM

## 2022-06-26 DIAGNOSIS — I10 Essential (primary) hypertension: Secondary | ICD-10-CM

## 2022-06-26 DIAGNOSIS — D508 Other iron deficiency anemias: Secondary | ICD-10-CM

## 2022-06-26 DIAGNOSIS — F410 Panic disorder [episodic paroxysmal anxiety] without agoraphobia: Secondary | ICD-10-CM

## 2022-06-26 LAB — BASIC METABOLIC PANEL
BUN: 11 mg/dL (ref 6–23)
CO2: 26 mEq/L (ref 19–32)
Calcium: 9.5 mg/dL (ref 8.4–10.5)
Chloride: 103 mEq/L (ref 96–112)
Creatinine, Ser: 0.86 mg/dL (ref 0.40–1.50)
GFR: 97.03 mL/min (ref >60.00)
Glucose, Bld: 100 mg/dL — ABNORMAL HIGH (ref 70–99)
Potassium: 3.9 mEq/L (ref 3.5–5.1)
Sodium: 139 mEq/L (ref 135–145)

## 2022-06-26 LAB — CBC WITH DIFFERENTIAL/PLATELET
Basophils Absolute: 0.1 10*3/uL (ref 0.0–0.1)
Basophils Relative: 0.8 % (ref 0.0–3.0)
Eosinophils Absolute: 0.1 10*3/uL (ref 0.0–0.7)
Eosinophils Relative: 1.4 % (ref 0.0–5.0)
HCT: 40.9 % (ref 39.0–52.0)
Hemoglobin: 13.9 g/dL (ref 13.0–17.0)
Lymphocytes Relative: 24.1 % (ref 12.0–46.0)
Lymphs Abs: 2.2 10*3/uL (ref 0.7–4.0)
MCHC: 33.9 g/dL (ref 30.0–36.0)
MCV: 86.4 fl (ref 78.0–100.0)
Monocytes Absolute: 0.8 10*3/uL (ref 0.1–1.0)
Monocytes Relative: 8.1 % (ref 3.0–12.0)
Neutro Abs: 6.1 10*3/uL (ref 1.4–7.7)
Neutrophils Relative %: 65.6 % (ref 43.0–77.0)
Platelets: 333 10*3/uL (ref 150.0–400.0)
RBC: 4.74 Mil/uL (ref 4.22–5.81)
RDW: 14.1 % (ref 11.5–15.5)
WBC: 9.3 10*3/uL (ref 4.0–10.5)

## 2022-06-26 LAB — C-REACTIVE PROTEIN: CRP: <1 mg/dL (ref 0.5–20.0)

## 2022-06-26 MED ORDER — OXYCODONE-ACETAMINOPHEN 5-325 MG PO TABS: 1.0000 | ORAL_TABLET | Freq: Three times a day (TID) | ORAL | 0 refills | Status: DC | PRN

## 2022-06-26 MED ORDER — OMEGA-3-ACID ETHYL ESTERS 1 G PO CAPS: 2.0000 | ORAL_CAPSULE | Freq: Two times a day (BID) | ORAL | 1 refills | Status: DC

## 2022-06-26 NOTE — Patient Instructions (Signed)
Hypertension, Adult High blood pressure (hypertension) is when the force of blood pumping through the arteries is too strong. The arteries are the blood vessels that carry blood from the heart throughout the body. Hypertension forces the heart to work harder to pump blood and may cause arteries to become narrow or stiff. Untreated or uncontrolled hypertension can lead to a heart attack, heart failure, a stroke, kidney disease, and other problems. A blood pressure reading consists of a higher number over a lower number. Ideally, your blood pressure should be below 120/80. The first ("top") number is called the systolic pressure. It is a measure of the pressure in your arteries as your heart beats. The second ("bottom") number is called the diastolic pressure. It is a measure of the pressure in your arteries as the heart relaxes. What are the causes? The exact cause of this condition is not known. There are some conditions that result in high blood pressure. What increases the risk? Certain factors may make you more likely to develop high blood pressure. Some of these risk factors are under your control, including: Smoking. Not getting enough exercise or physical activity. Being overweight. Having too much fat, sugar, calories, or salt (sodium) in your diet. Drinking too much alcohol. Other risk factors include: Having a personal history of heart disease, diabetes, high cholesterol, or kidney disease. Stress. Having a family history of high blood pressure and high cholesterol. Having obstructive sleep apnea. Age. The risk increases with age. What are the signs or symptoms? High blood pressure may not cause symptoms. Very high blood pressure (hypertensive crisis) may cause: Headache. Fast or irregular heartbeats (palpitations). Shortness of breath. Nosebleed. Nausea and vomiting. Vision changes. Severe chest pain, dizziness, and seizures. How is this diagnosed? This condition is diagnosed by  measuring your blood pressure while you are seated, with your arm resting on a flat surface, your legs uncrossed, and your feet flat on the floor. The cuff of the blood pressure monitor will be placed directly against the skin of your upper arm at the level of your heart. Blood pressure should be measured at least twice using the same arm. Certain conditions can cause a difference in blood pressure between your right and left arms. If you have a high blood pressure reading during one visit or you have normal blood pressure with other risk factors, you may be asked to: Return on a different day to have your blood pressure checked again. Monitor your blood pressure at home for 1 week or longer. If you are diagnosed with hypertension, you may have other blood or imaging tests to help your health care provider understand your overall risk for other conditions. How is this treated? This condition is treated by making healthy lifestyle changes, such as eating healthy foods, exercising more, and reducing your alcohol intake. You may be referred for counseling on a healthy diet and physical activity. Your health care provider may prescribe medicine if lifestyle changes are not enough to get your blood pressure under control and if: Your systolic blood pressure is above 130. Your diastolic blood pressure is above 80. Your personal target blood pressure may vary depending on your medical conditions, your age, and other factors. Follow these instructions at home: Eating and drinking  Eat a diet that is high in fiber and potassium, and low in sodium, added sugar, and fat. An example of this eating plan is called the DASH diet. DASH stands for Dietary Approaches to Stop Hypertension. To eat this way: Eat   plenty of fresh fruits and vegetables. Try to fill one half of your plate at each meal with fruits and vegetables. Eat whole grains, such as whole-wheat pasta, brown rice, or whole-grain bread. Fill about one  fourth of your plate with whole grains. Eat or drink low-fat dairy products, such as skim milk or low-fat yogurt. Avoid fatty cuts of meat, processed or cured meats, and poultry with skin. Fill about one fourth of your plate with lean proteins, such as fish, chicken without skin, beans, eggs, or tofu. Avoid pre-made and processed foods. These tend to be higher in sodium, added sugar, and fat. Reduce your daily sodium intake. Many people with hypertension should eat less than 1,500 mg of sodium a day. Do not drink alcohol if: Your health care provider tells you not to drink. You are pregnant, may be pregnant, or are planning to become pregnant. If you drink alcohol: Limit how much you have to: 0-1 drink a day for women. 0-2 drinks a day for men. Know how much alcohol is in your drink. In the [place] one drink equals one 12 oz bottle of beer (355 mL), one 5 oz glass of wine (148 mL), or one 1 oz glass of hard liquor (44 mL). Lifestyle  Work with your health care provider to maintain a healthy body weight or to lose weight. Ask what an ideal weight is for you. Get at least 30 minutes of exercise that causes your heart to beat faster (aerobic exercise) most days of the week. Activities may include walking, swimming, or biking. Include exercise to strengthen your muscles (resistance exercise), such as Pilates or lifting weights, as part of your weekly exercise routine. Try to do these types of exercises for 30 minutes at least 3 days a week. Do not use any products that contain nicotine or tobacco. These products include cigarettes, chewing tobacco, and vaping devices, such as e-cigarettes. If you need help quitting, ask your health care provider. Monitor your blood pressure at home as told by your health care provider. Keep all follow-up visits. This is important. Medicines Take over-the-counter and prescription medicines only as told by your health care provider. Follow directions carefully. Blood  pressure medicines must be taken as prescribed. Do not skip doses of blood pressure medicine. Doing this puts you at risk for problems and can make the medicine less effective. Ask your health care provider about side effects or reactions to medicines that you should watch for. Contact a health care provider if you: Think you are having a reaction to a medicine you are taking. Have headaches that keep coming back (recurring). Feel dizzy. Have swelling in your ankles. Have trouble with your vision. Get help right away if you: Develop a severe headache or confusion. Have unusual weakness or numbness. Feel faint. Have severe pain in your chest or abdomen. Vomit repeatedly. Have trouble breathing. These symptoms may be an emergency. Get help right away. Call 911. Do not wait to see if the symptoms will go away. Do not drive yourself to the hospital. Summary Hypertension is when the force of blood pumping through your arteries is too strong. If this condition is not controlled, it may put you at risk for serious complications. Your personal target blood pressure may vary depending on your medical conditions, your age, and other factors. For most people, a normal blood pressure is less than 120/80. Hypertension is treated with lifestyle changes, medicines, or a combination of both. Lifestyle changes include losing weight, eating a healthy,   low-sodium diet, exercising more, and limiting alcohol. This information is not intended to replace advice given to you by your health care provider. Make sure you discuss any questions you have with your health care provider. Document Revised: 08/06/2021 Document Reviewed: 08/06/2021 Elsevier Patient Education  2023 [employer]  

## 2022-06-26 NOTE — Progress Notes (Signed)
Subjective:  Patient ID: Jonathan Luna, male    DOB: [birth year]  Age: 56 y.o. MRN: [id number]  CC: Hypertension and Osteoarthritis   HPI Jonathan Luna presents for f/up -  He walks about a mile a day and does not experience chest pain, shortness of breath, diaphoresis, or edema.  Outpatient Medications Prior to Visit  Medication Sig Dispense Refill   Ascorbic Acid (VITAMIN C PO) Take by mouth.     Cholecalciferol (VITAMIN D-3) 25 MCG (1000 UT) CAPS Take 1 capsule by mouth daily.     clonazePAM (KLONOPIN) 0.5 MG tablet Take 1 tablet (0.5 mg total) by mouth 3 (three) times daily as needed for anxiety. 90 tablet 2   Ferric Maltol (ACCRUFER) 30 MG CAPS Take 1 capsule by mouth in the morning and at bedtime. 180 capsule 1   fluticasone (FLONASE) 50 MCG/ACT nasal spray Place 2 sprays into both nostrils daily. 48 g 1   irbesartan (AVAPRO) 300 MG tablet TAKE ONE TABLET BY MOUTH ONE TIME DAILY 90 tablet 0   levothyroxine (SYNTHROID) 125 MCG tablet TAKE ONE TABLET BY MOUTH ONE TIME DAILY 90 tablet 0   Multiple Vitamins-Minerals (ZINC PO) Take by mouth.     traZODone (DESYREL) 100 MG tablet Take 1 tablet (100 mg total) by mouth at bedtime as needed for sleep. 90 tablet 0   Turmeric (QC TUMERIC COMPLEX PO) Take by mouth as needed.     busPIRone (BUSPAR) 5 MG tablet TAKE ONE TABLET BY MOUTH THREE TIMES DAILY 90 tablet 0   omega-3 acid ethyl esters (LOVAZA) 1 g capsule TAKE TWO CAPSULES BY MOUTH TWICE DAILY 360 capsule 0   oxyCODONE-acetaminophen (PERCOCET/ROXICET) 5-325 MG tablet Take 1 tablet by mouth every 8 (eight) hours as needed for severe pain. 90 tablet 0   Fluticasone-Umeclidin-Vilant (TRELEGY ELLIPTA) 100-62.5-25 MCG/ACT AEPB Inhale 1 puff into the lungs daily. 120 each 0   hydrOXYzine (ATARAX) 25 MG tablet Take 1 tablet (25 mg total) by mouth every 8 (eight) hours as needed. 30 tablet 0   omeprazole (PRILOSEC) 40 MG capsule Take 1 capsule (40 mg total) by mouth daily. 30 capsule 3   No  facility-administered medications prior to visit.    ROS Review of Systems  Constitutional:  Positive for unexpected weight change (wt gain). Negative for chills, diaphoresis and fatigue.  HENT: Negative.    Eyes: Negative.   Respiratory:  Negative for cough, chest tightness, shortness of breath and wheezing.   Cardiovascular:  Negative for chest pain, palpitations and leg swelling.  Gastrointestinal:  Negative for abdominal pain, constipation, diarrhea, nausea and vomiting.  Endocrine: Negative.   Genitourinary: Negative.  Negative for difficulty urinating.  Musculoskeletal:  Positive for arthralgias, back pain and neck pain. Negative for joint swelling and myalgias.  Skin: Negative.   Neurological: Negative.  Negative for dizziness, weakness, light-headedness and headaches.  Hematological:  Negative for adenopathy. Does not bruise/bleed easily.  Psychiatric/Behavioral:  Negative for confusion, decreased concentration, dysphoric mood, hallucinations and sleep disturbance. The patient is nervous/anxious.     Objective:  BP (!) 152/92 (BP Location: Right Arm, Patient Position: Sitting, Cuff Size: Large)   Pulse 82   Temp 98.1 F (36.7 C) (Oral)   Resp 16   Ht '5\' 6"'$  (1.676 m)   Wt 205 lb (93 kg)   SpO2 97%   BMI 33.09 kg/m   BP Readings from Last 3 Encounters:  06/26/22 (!) 152/92  04/07/22 128/74  03/25/22 122/80    Wt  Readings from Last 3 Encounters:  06/26/22 205 lb (93 kg)  04/07/22 198 lb (89.8 kg)  03/25/22 198 lb 8 oz (90 kg)    Physical Exam Vitals reviewed.  HENT:     Nose: Nose normal.  Eyes:     General: No scleral icterus.    Conjunctiva/sclera: Conjunctivae normal.  Cardiovascular:     Rate and Rhythm: Normal rate and regular rhythm.     Heart sounds: No murmur heard. Pulmonary:     Effort: Pulmonary effort is normal.     Breath sounds: No stridor. No wheezing, rhonchi or rales.  Abdominal:     General: Abdomen is flat.     Palpations: There is no  mass.     Tenderness: There is no abdominal tenderness. There is no guarding.     Hernia: No hernia is present.  Musculoskeletal:        General: Normal range of motion.     Cervical back: Neck supple.     Right lower leg: No edema.     Left lower leg: No edema.  Lymphadenopathy:     Cervical: No cervical adenopathy.  Skin:    General: Skin is warm and dry.  Neurological:     General: No focal deficit present.     Mental Status: He is alert.  Psychiatric:        Mood and Affect: Mood normal.        Behavior: Behavior normal.     Lab Results  Component Value Date   WBC 9.3 06/26/2022   HGB 13.9 06/26/2022   HCT 40.9 06/26/2022   PLT 333.0 06/26/2022   GLUCOSE 100 (H) 06/26/2022   CHOL 203 (H) 04/07/2022   TRIG 206.0 (H) 04/07/2022   HDL 38.70 (L) 04/07/2022   LDLDIRECT 150.0 04/07/2022   LDLCALC 106 (H) 04/07/2017   ALT 17 02/27/2022   AST 14 02/27/2022   NA 139 06/26/2022   K 3.9 06/26/2022   CL 103 06/26/2022   CREATININE 0.86 06/26/2022   BUN 11 06/26/2022   CO2 26 06/26/2022   TSH 2.46 02/25/2022   PSA 2.36 10/23/2021   HGBA1C 5.2 07/28/2012    CT Chest W Contrast  Result Date: 05/07/2022 CLINICAL DATA:  Chest pain, nonspecific. Table formatting from the original note was not included. Chest pain, chronic cough, wheezing, feels sore on the inside since 12/2021 No hx of surgery No hx of cancer Hx of HTN Non smoker EXAM: CT CHEST WITH CONTRAST TECHNIQUE: Multidetector CT imaging of the chest was performed during intravenous contrast administration. RADIATION DOSE REDUCTION: This exam was performed according to the departmental dose-optimization program which includes automated exposure control, adjustment of the mA and/or kV according to patient size and/or use of iterative reconstruction technique. CONTRAST:  142m ISOVUE-300 IOPAMIDOL (ISOVUE-300) INJECTION 61% COMPARISON:  Chest x-ray 03/20/2022 FINDINGS: Cardiovascular: Normal heart size. No significant  pericardial effusion. The thoracic aorta is normal in caliber. No atherosclerotic plaque of the thoracic aorta. No coronary artery calcifications. Mediastinum/Nodes: No enlarged mediastinal, hilar, or axillary lymph nodes. Thyroid gland, trachea, and esophagus demonstrate no significant findings. Lungs/Pleura: No focal consolidation. Subpleural triangular micronodules likely intrapulmonary lymph nodes. No pulmonary mass. No pleural effusion. No pneumothorax. Upper Abdomen: No acute abnormality. Musculoskeletal: No chest wall abnormality. No suspicious lytic or blastic osseous lesions. No acute displaced fracture. Multilevel mild degenerative changes of the spine. Multilevel intervertebral disc space vacuum phenomenon. IMPRESSION: No acute intrathoracic abnormality. Electronically Signed   By: MClelia CroftD.  On: 05/07/2022 19:36   CT Soft Tissue Neck W Contrast  Result Date: 05/07/2022 CLINICAL DATA:  Left-sided neck pain with swallowing. EXAM: CT NECK WITH CONTRAST TECHNIQUE: Multidetector CT imaging of the neck was performed using the standard protocol following the bolus administration of intravenous contrast. RADIATION DOSE REDUCTION: This exam was performed according to the departmental dose-optimization program which includes automated exposure control, adjustment of the mA and/or kV according to patient size and/or use of iterative reconstruction technique. CONTRAST:  188m ISOVUE-300 IOPAMIDOL (ISOVUE-300) INJECTION 61% COMPARISON:  None Available. FINDINGS: Pharynx and larynx: Moderate motion artifact through the oropharynx. No mass identified within this limitation. Patent airway. No fluid collection or inflammatory changes in the parapharyngeal or retropharyngeal spaces. Salivary glands: Small, homogeneously enhancing nodules in the parotid glands measuring up to 10 x 7 mm on the left and 8 x 5 mm on the right favoring intraparotid lymph nodes. Unremarkable submandibular glands. No evidence of  acute inflammation or salivary stone. Thyroid: Diffusely small thyroid gland without a focal abnormality identified. Lymph nodes: Mildly prominent number and size of lymph nodes in the neck bilaterally which are not frankly enlarged (all subcentimeter in short axis) and are most notable in levels I and II, although a few mildly prominent level V nodes are also present. No abnormal lymph node density. Vascular: Major vascular structures of the neck are grossly patent. Limited intracranial: Unremarkable. Visualized orbits: Bilateral cataract extraction. Mastoids and visualized paranasal sinuses: Small mucous retention cyst in the left maxillary sinus. Clear mastoid air cells. Skeleton: Mild disc and facet degeneration in the cervical and upper thoracic spine. No suspicious osseous lesion. Upper chest: Reported separately. Other: None. IMPRESSION: Small but mildly prominent lymph nodes in the upper neck bilaterally, nonspecific but may be reactive/inflammatory in nature. No primary neck mass identified. Electronically Signed   By: ALogan BoresM.D.   On: 05/07/2022 09:47    Assessment & Plan:   TBrallanwas seen today for hypertension and osteoarthritis.  Diagnoses and all orders for this visit:  Essential hypertension- His blood pressure is not adequately well controlled.  Will add a thiazide to the ARB. -     Basic metabolic panel; Future -     Urine drugs of abuse scrn w alc, routine (Ref Lab); Future -     CBC with Differential/Platelet; Future -     Basic metabolic panel -     Urine drugs of abuse scrn w alc, routine (Ref Lab) -     CBC with Differential/Platelet -     indapamide (LOZOL) 1.25 MG tablet; Take 1 tablet (1.25 mg total) by mouth daily.  Pure hyperglyceridemia -     omega-3 acid ethyl esters (LOVAZA) 1 g capsule; Take 2 capsules (2 g total) by mouth 2 (two) times daily.  DDD (degenerative disc disease), lumbar -     oxyCODONE-acetaminophen (PERCOCET/ROXICET) 5-325 MG tablet; Take 1  tablet by mouth every 8 (eight) hours as needed for severe pain. -     C-reactive protein; Future -     C-reactive protein  Chronic pain disorder -     oxyCODONE-acetaminophen (PERCOCET/ROXICET) 5-325 MG tablet; Take 1 tablet by mouth every 8 (eight) hours as needed for severe pain. -     C-reactive protein; Future -     C-reactive protein  Chronic low back pain without sciatica, unspecified back pain laterality -     oxyCODONE-acetaminophen (PERCOCET/ROXICET) 5-325 MG tablet; Take 1 tablet by mouth every 8 (eight) hours as needed  for severe pain. -     C-reactive protein; Future -     C-reactive protein  Long-term current use of opiate analgesic -     Oxycodone/Oxymorphone, Urine; Future -     DRUG MONITOR, OPIATES,W/CONF, URINE; Future -     Urine drugs of abuse scrn w alc, routine (Ref Lab); Future -     Oxycodone/Oxymorphone, Urine -     DRUG MONITOR, OPIATES,W/CONF, URINE -     Urine drugs of abuse scrn w alc, routine (Ref Lab)  Iron deficiency anemia secondary to inadequate dietary iron intake -     CBC with Differential/Platelet; Future -     CBC with Differential/Platelet  Panic anxiety syndrome -     busPIRone (BUSPAR) 5 MG tablet; Take 1 tablet (5 mg total) by mouth 3 (three) times daily.  Other orders -     Flu Vaccine QUAD 6+ mos PF IM (Fluarix Quad PF) -     DM TEMPLATE   I have discontinued Rhydian L. Courington's hydrOXYzine, omeprazole, and Trelegy Ellipta. I have also changed his omega-3 acid ethyl esters and busPIRone. Additionally, I am having him start on indapamide. Lastly, I am having him maintain his fluticasone, Multiple Vitamins-Minerals (ZINC PO), Turmeric (QC TUMERIC COMPLEX PO), Ascorbic Acid (VITAMIN C PO), ACCRUFeR, Vitamin D-3, traZODone, clonazePAM, levothyroxine, irbesartan, and oxyCODONE-acetaminophen.  Meds ordered this encounter  Medications   omega-3 acid ethyl esters (LOVAZA) 1 g capsule    Sig: Take 2 capsules (2 g total) by mouth 2 (two) times  daily.    Dispense:  360 capsule    Refill:  1   oxyCODONE-acetaminophen (PERCOCET/ROXICET) 5-325 MG tablet    Sig: Take 1 tablet by mouth every 8 (eight) hours as needed for severe pain.    Dispense:  90 tablet    Refill:  0   busPIRone (BUSPAR) 5 MG tablet    Sig: Take 1 tablet (5 mg total) by mouth 3 (three) times daily.    Dispense:  270 tablet    Refill:  0   indapamide (LOZOL) 1.25 MG tablet    Sig: Take 1 tablet (1.25 mg total) by mouth daily.    Dispense:  90 tablet    Refill:  0     Follow-up: Return in about 3 months (around 09/25/2022).  Scarlette Calico, MD

## 2022-06-27 ENCOUNTER — Encounter: Payer: Self-pay | Admitting: Internal Medicine

## 2022-06-28 LAB — DRUG MONITOR, OPIATES,W/CONF, URINE
Codeine: NEGATIVE ng/mL (ref <50)
Hydrocodone: NEGATIVE ng/mL (ref <50)
Hydromorphone: NEGATIVE ng/mL (ref <50)
Morphine: NEGATIVE ng/mL (ref <50)
Norhydrocodone: NEGATIVE ng/mL (ref <50)
Opiates: NEGATIVE ng/mL (ref <100)

## 2022-06-28 LAB — DM TEMPLATE

## 2022-06-30 ENCOUNTER — Other Ambulatory Visit: Payer: Self-pay | Admitting: Internal Medicine

## 2022-06-30 ENCOUNTER — Encounter: Payer: Self-pay | Admitting: Internal Medicine

## 2022-06-30 DIAGNOSIS — F410 Panic disorder [episodic paroxysmal anxiety] without agoraphobia: Secondary | ICD-10-CM

## 2022-06-30 MED ORDER — BUSPIRONE HCL 5 MG PO TABS: 5.0000 mg | ORAL_TABLET | Freq: Three times a day (TID) | ORAL | 0 refills | Status: DC

## 2022-07-01 LAB — URINE DRUGS OF ABUSE SCREEN W ALC, ROUTINE (REF LAB)

## 2022-07-01 LAB — OXYCODONE/OXYMORPHONE, URINE

## 2022-07-04 ENCOUNTER — Encounter: Payer: Self-pay | Admitting: Internal Medicine

## 2022-07-04 MED ORDER — INDAPAMIDE 1.25 MG PO TABS: 1.2500 mg | ORAL_TABLET | Freq: Every day | ORAL | 0 refills | Status: DC

## 2022-07-07 ENCOUNTER — Other Ambulatory Visit: Payer: Self-pay | Admitting: Internal Medicine

## 2022-07-07 ENCOUNTER — Other Ambulatory Visit: Payer: BC Managed Care – PPO

## 2022-07-07 DIAGNOSIS — Z79891 Long term (current) use of opiate analgesic: Secondary | ICD-10-CM

## 2022-07-07 DIAGNOSIS — G894 Chronic pain syndrome: Secondary | ICD-10-CM

## 2022-07-07 DIAGNOSIS — F410 Panic disorder [episodic paroxysmal anxiety] without agoraphobia: Secondary | ICD-10-CM

## 2022-07-07 DIAGNOSIS — I10 Essential (primary) hypertension: Secondary | ICD-10-CM

## 2022-07-08 ENCOUNTER — Encounter: Payer: Self-pay | Admitting: Internal Medicine

## 2022-07-08 LAB — URINE DRUGS OF ABUSE SCREEN W ALC, ROUTINE (REF LAB)
Amphetamines, Urine: NEGATIVE ng/mL
Barbiturate Quant, Ur: NEGATIVE ng/mL
Benzodiazepine Quant, Ur: NEGATIVE ng/mL
Cannabinoid Quant, Ur: NEGATIVE ng/mL
Cocaine (Metab.): NEGATIVE ng/mL
Ethanol, Urine: NEGATIVE %
Methadone Screen, Urine: NEGATIVE ng/mL
Opiate Quant, Ur: NEGATIVE ng/mL
PCP Quant, Ur: NEGATIVE ng/mL
Propoxyphene: NEGATIVE ng/mL

## 2022-07-08 LAB — OXYCODONE/OXYMORPHONE, URINE: Oxycodone+Oxymorphone Ur Ql Scn: POSITIVE ng/mL — AB

## 2022-07-18 ENCOUNTER — Ambulatory Visit: Payer: BC Managed Care – PPO | Attending: Internal Medicine | Admitting: Internal Medicine

## 2022-07-18 ENCOUNTER — Encounter: Payer: Self-pay | Admitting: Internal Medicine

## 2022-07-18 VITALS — BP 136/85 | HR 76 | Resp 15 | Ht 66.5 in | Wt 210.4 lb

## 2022-07-18 DIAGNOSIS — M542 Cervicalgia: Secondary | ICD-10-CM | POA: Diagnosis not present

## 2022-07-18 DIAGNOSIS — L301 Dyshidrosis [pompholyx]: Secondary | ICD-10-CM

## 2022-07-18 DIAGNOSIS — G8929 Other chronic pain: Secondary | ICD-10-CM | POA: Diagnosis not present

## 2022-07-18 DIAGNOSIS — M25562 Pain in left knee: Secondary | ICD-10-CM | POA: Diagnosis not present

## 2022-07-18 DIAGNOSIS — R700 Elevated erythrocyte sedimentation rate: Secondary | ICD-10-CM | POA: Diagnosis not present

## 2022-07-18 NOTE — Patient Instructions (Signed)
I recommend trying to treat your skin rashes first with a moisturizing lotion or ointment such as cetaphil, cerave, or aquaphor. If these are not beneficial at all then topical hydrocortisone can be a next option for eczema.

## 2022-07-18 NOTE — Progress Notes (Signed)
Office Visit Note  Patient: Jonathan Luna             Date of Birth: 07-01-1966           MRN: [id number]             PCP: Janith Lima, MD Referring: Janith Lima, MD Visit Date: 07/18/2022   Subjective:  New Patient (Initial Visit) (Swelling in left knee and inflamation in back of knee. Inflammation and pain in right shoulder, hips, and back. )   History of Present Illness: Jonathan Luna is a 56 y.o. male here for evaluation of elevated sedimentation rate and multiple joint pains. He was evaluated at the time for knee pain and back pain and some constitutional symptoms with night sweats. Imaging has demonstrated some degenerative lumbar spine changes. Subsequent chest CT negative for any inflammatory process and knee xray without significant degenerative disease.  During few months since this initial evaluation most of his symptoms have decreased a lot over time.  He is not on any maintenance or anti-inflammatory medications.  He does take chronic low-dose pain medication for his degenerative disease in the back.  CT imaging of his cervical spine was checked this shows some degenerative changes but no acute abnormality.  He does have prominent lymph nodes noted bilaterally most suspected as reactive or inflammatory in nature.  Labs reviewed 03/2022 ANA neg RF neg ESR 31 Uric acid 5.3  Activities of Daily Living:  Patient reports morning stiffness for 30 min to 2 hours.   Patient Denies nocturnal pain.  Difficulty dressing/grooming: Denies Difficulty climbing stairs: Reports Difficulty getting out of chair: Reports Difficulty using hands for taps, buttons, cutlery, and/or writing: Denies  Review of Systems  Constitutional:  Positive for fatigue.  HENT:  Negative for mouth sores and mouth dryness.   Eyes:  Negative for dryness.  Respiratory:  Negative for shortness of breath.   Cardiovascular:  Negative for chest pain and palpitations.  Gastrointestinal:  Negative for blood in  stool, constipation and diarrhea.  Endocrine: Negative for increased urination.  Genitourinary:  Negative for involuntary urination.  Musculoskeletal:  Positive for joint pain, gait problem, joint pain, joint swelling, myalgias, morning stiffness, muscle tenderness and myalgias. Negative for muscle weakness.  Skin:  Negative for color change, rash, hair loss and sensitivity to sunlight.  Allergic/Immunologic: Negative for susceptible to infections.  Neurological:  Negative for dizziness and headaches.  Hematological:  Negative for swollen glands.  Psychiatric/Behavioral:  Negative for depressed mood and sleep disturbance. The patient is nervous/anxious.     PMFS History:  Patient Active Problem List   Diagnosis Date Noted   Neck pain 07/18/2022   Moderate persistent asthma without complication 53/61/4431   Iron deficiency anemia secondary to inadequate dietary iron intake 02/27/2022   Pain in left knee 02/25/2022   Hypogonadism male 10/24/2021   Vitamin D deficiency disease 09/08/2018   Essential hypertension 09/07/2018   Long-term current use of opiate analgesic 04/07/2018   Alcohol use disorder, mild, abuse 08/20/2017   Encounter for long-term opiate analgesic use 08/03/2017   Colon cancer screening 12/10/2016   Pure hyperglyceridemia 09/11/2016   Low back pain 05/08/2016   Eczema, dyshidrotic 11/21/2013   Allergic rhinitis 12/13/2012   Chronic pain disorder 09/29/2012   ED (erectile dysfunction) 07/28/2012   Routine general medical examination at a health care facility 10/08/2011   Irritable bowel syndrome 10/16/2010   DDD (degenerative disc disease), lumbar 04/09/2010   Hypothyroidism 05/03/2009  Hyperlipidemia with target LDL less than 130 05/03/2009   Panic anxiety syndrome 05/03/2009    Past Medical History:  Diagnosis Date   Allergy    Anxiety    Arthritis    Cataract    Depression    Heart murmur    History of bilateral cataract extraction    Hyperlipidemia     Hypothyroidism    Low back pain    Panic attack    Panic attacks     Family History  Problem Relation Age of Onset   Breast cancer Mother    Hypertension Father    Heart disease Father    Asthma Father    Emphysema Father        smoker for 10 years   Diabetes Brother    Hyperlipidemia Brother    Past Surgical History:  Procedure Laterality Date   CATARACT EXTRACTION     EYE SURGERY     Social History   Social History Narrative   Caffienated drinks-no   Seat belt use often-yes   Regular Exercise-yes   Smoke alarm in the home-yes   Firearms/guns in the home-no   History of physical abuse-no               Immunization History  Administered Date(s) Administered   Influenza Whole 07/22/2012   Influenza,inj,Quad PF,6+ Mos 06/13/2017, 07/27/2018, 06/08/2019, 06/11/2021, 06/26/2022   Influenza-Unspecified 07/13/2014, 07/16/2016   PFIZER(Purple Top)SARS-COV-2 Vaccination 01/02/2020, 01/25/2020   Tdap 11/20/2011, 02/25/2022   Zoster Recombinat (Shingrix) 02/25/2022     Objective: Vital Signs: BP 136/85 (BP Location: Left Arm, Patient Position: Sitting, Cuff Size: Normal)   Pulse 76   Resp 15   Ht 5' 6.5" (1.689 m)   Wt 210 lb 6.4 oz (95.4 kg)   BMI 33.45 kg/m    Physical Exam Cardiovascular:     Rate and Rhythm: Normal rate and regular rhythm.  Pulmonary:     Effort: Pulmonary effort is normal.     Breath sounds: Normal breath sounds.  Skin:    General: Skin is warm and dry.  Neurological:     Mental Status: He is alert.  Psychiatric:        Mood and Affect: Mood normal.      Musculoskeletal Exam:  Neck full ROM pain at base of neck with rotation and with pressure on paraspinal muscles  Shoulders full ROM no tenderness or swelling Elbows full ROM no tenderness or swelling Wrists full ROM no tenderness or swelling Fingers full ROM no tenderness or swelling Knees full ROM no tenderness or swelling Ankles full ROM no tenderness or  swelling   Investigation: No additional findings.  Imaging: No results found.  Recent Labs: Lab Results  Component Value Date   WBC 9.3 06/26/2022   HGB 13.9 06/26/2022   PLT 333.0 06/26/2022   NA 139 06/26/2022   K 3.9 06/26/2022   CL 103 06/26/2022   CO2 26 06/26/2022   GLUCOSE 100 (H) 06/26/2022   BUN 11 06/26/2022   CREATININE 0.86 06/26/2022   BILITOT 0.5 02/27/2022   ALKPHOS 73 02/27/2022   AST 14 02/27/2022   ALT 17 02/27/2022   PROT 8.1 02/27/2022   ALBUMIN 4.6 02/27/2022   CALCIUM 9.5 06/26/2022   GFRAA 94 08/16/2008    Speciality Comments: No specialty comments available.  Procedures:  No procedures performed Allergies: Effexor [venlafaxine], Fluoxetine hcl, and Prozac [fluoxetine hcl]   Assessment / Plan:     Visit Diagnoses: Elevated sedimentation rate  Chronic pain of left knee - Plan: Sedimentation rate, C-reactive protein, Cyclic citrul peptide antibody, IgG, B. burgdorfi antibodies  Elevated sed rate most symptoms are improved at this point still having some pain at the left knee.  Plan to recheck the inflammatory markers we will also check a CCP antibody titer.  Screening for Lyme disease but I think this is unlikely.  I recommend we can monitor with follow-up in a few weeks.  If symptoms continue to improve or stay minimal would not recommend any additional aggressive treatments.  If pain gets worse or other concerning findings consider left knee aspiration at follow-up for synovial fluid analysis.  Eczema, dyshidrotic  Skin rashes do look like atopic dermatitis or dyshidrotic eczema problem.  No evidence of psoriasis.  No EM no vasculitic rashes no photosensitive dermatitis.  Does not seem extensive enough to require systemic medication discussed using topical emollients first or would probably benefit with topical steroid treatment if not improving.  Neck pain - Plan: Ambulatory referral to Physical Therapy  Neck pain on exam today appears most  suggestive for being paraspinal muscle or other soft tissue pain.  He does have a very forward head posture at rest today could be contributing to some degree of strain or spasticity.  CT of the neck did not show any concerning findings I think he could benefit with physical therapy.  Orders: Orders Placed This Encounter  Procedures   Sedimentation rate   C-reactive protein   Cyclic citrul peptide antibody, IgG   B. burgdorfi antibodies   B. burgdorfi antibodies   Ambulatory referral to Physical Therapy   No orders of the defined types were placed in this encounter.    Follow-Up Instructions: Return in about 4 weeks (around 08/15/2022) for New pt arthritis ?US aspiration f/u ~4wks.   Collier Salina, MD  Note - This record has been created using [employer].  Chart creation errors have been sought, but may not always  have been located. Such creation errors do not reflect on  the standard of medical care.

## 2022-07-21 LAB — C-REACTIVE PROTEIN: CRP: 6.2 mg/L (ref <8.0)

## 2022-07-21 LAB — B. BURGDORFI ANTIBODIES: B burgdorferi Ab IgG+IgM: <0.9 index

## 2022-07-21 LAB — CYCLIC CITRUL PEPTIDE ANTIBODY, IGG: Cyclic Citrullin Peptide Ab: <16 UNITS

## 2022-07-21 LAB — SEDIMENTATION RATE: Sed Rate: 9 mm/h (ref 0–20)

## 2022-07-23 NOTE — Progress Notes (Signed)
Tests are negative for Lyme disease or rheumatoid arthritis antibodies. The sedimentation rate is down to normal from being elevated a few months earlier.

## 2022-07-24 ENCOUNTER — Other Ambulatory Visit: Payer: Self-pay | Admitting: Internal Medicine

## 2022-07-24 ENCOUNTER — Encounter: Payer: Self-pay | Admitting: Internal Medicine

## 2022-07-24 DIAGNOSIS — G8929 Other chronic pain: Secondary | ICD-10-CM

## 2022-07-24 DIAGNOSIS — M5136 Other intervertebral disc degeneration, lumbar region: Secondary | ICD-10-CM

## 2022-07-24 DIAGNOSIS — G894 Chronic pain syndrome: Secondary | ICD-10-CM

## 2022-07-25 NOTE — Telephone Encounter (Signed)
Key: [id number]  Approved Effective from 07/25/2022 through 08/23/2022.

## 2022-07-27 MED ORDER — OXYCODONE-ACETAMINOPHEN 5-325 MG PO TABS: 1.0000 | ORAL_TABLET | Freq: Three times a day (TID) | ORAL | 0 refills | Status: DC | PRN

## 2022-07-28 ENCOUNTER — Other Ambulatory Visit: Payer: Self-pay | Admitting: Internal Medicine

## 2022-07-28 DIAGNOSIS — F410 Panic disorder [episodic paroxysmal anxiety] without agoraphobia: Secondary | ICD-10-CM

## 2022-08-14 ENCOUNTER — Other Ambulatory Visit: Payer: Self-pay | Admitting: Internal Medicine

## 2022-08-14 DIAGNOSIS — E039 Hypothyroidism, unspecified: Secondary | ICD-10-CM

## 2022-08-18 ENCOUNTER — Other Ambulatory Visit: Payer: Self-pay | Admitting: Internal Medicine

## 2022-08-18 DIAGNOSIS — E039 Hypothyroidism, unspecified: Secondary | ICD-10-CM

## 2022-08-19 MED ORDER — LEVOTHYROXINE SODIUM 125 MCG PO TABS: 125.0000 ug | ORAL_TABLET | Freq: Every day | ORAL | 0 refills | Status: DC

## 2022-08-19 NOTE — Progress Notes (Signed)
Office Visit Note  Patient: Jonathan Luna             Date of Birth: 03-Nov-1965           MRN: [id number]             PCP: Janith Lima, MD Referring: Janith Lima, MD Visit Date: 09/01/2022   Subjective:  Follow-up (Right hand and elbow swelling, bil knee swelling)   History of Present Illness: Jonathan Luna is a 56 y.o. male here for follow up for evaluation of elevated sedimentation rate and multiple joint pains.  Work-up was negative for RA antibodies negative for Lyme disease and the previously elevated sedimentation rate was also decreased to normal.  However he continues to experience pain in the right elbow that is coming and going and pain in bilateral knees.  Knee swelling mostly posterior on both sides he sometimes does feel this extends slightly into his calf.   Previous HPI 07/18/2022 [name] is a 56 y.o. male here for evaluation of elevated sedimentation rate and multiple joint pains. He was evaluated at the time for knee pain and back pain and some constitutional symptoms with night sweats. Imaging has demonstrated some degenerative lumbar spine changes. Subsequent chest CT negative for any inflammatory process and knee xray without significant degenerative disease.  During few months since this initial evaluation most of his symptoms have decreased a lot over time.  He is not on any maintenance or anti-inflammatory medications.  He does take chronic low-dose pain medication for his degenerative disease in the back.  CT imaging of his cervical spine was checked this shows some degenerative changes but no acute abnormality.  He does have prominent lymph nodes noted bilaterally most suspected as reactive or inflammatory in nature.   Labs reviewed 03/2022 ANA neg RF neg ESR 31 Uric acid 5.3   Activities of Daily Living:  Patient reports morning stiffness for 30 min to 2 hours.   Patient Denies nocturnal pain.  Difficulty dressing/grooming: Denies Difficulty climbing  stairs: Reports Difficulty getting out of chair: Reports Difficulty using hands for taps, buttons, cutlery, and/or writing: Denies   Review of Systems  Constitutional:  Positive for fatigue.  HENT:  Positive for mouth sores. Negative for mouth dryness.   Eyes:  Negative for dryness.  Respiratory:  Negative for shortness of breath.   Cardiovascular:  Negative for chest pain and palpitations.  Gastrointestinal:  Positive for constipation. Negative for blood in stool and diarrhea.  Endocrine: Negative for increased urination.  Genitourinary:  Negative for involuntary urination.  Musculoskeletal:  Positive for joint pain, joint pain, joint swelling, myalgias, morning stiffness and myalgias. Negative for gait problem, muscle weakness and muscle tenderness.  Skin:  Positive for sensitivity to sunlight. Negative for color change, rash and hair loss.  Allergic/Immunologic: Negative for susceptible to infections.  Neurological:  Positive for headaches. Negative for dizziness.  Hematological:  Negative for swollen glands.  Psychiatric/Behavioral:  Negative for depressed mood and sleep disturbance. The patient is nervous/anxious.     PMFS History:  Patient Active Problem List   Diagnosis Date Noted   Neck pain 07/18/2022   Iron deficiency anemia secondary to inadequate dietary iron intake 02/27/2022   Pain in left knee 02/25/2022   Hypogonadism male 10/24/2021   Vitamin D deficiency disease 09/08/2018   Essential hypertension 09/07/2018   Long-term current use of opiate analgesic 04/07/2018   Encounter for long-term opiate analgesic use 08/03/2017   Colon cancer  screening 12/10/2016   Pure hyperglyceridemia 09/11/2016   Low back pain 05/08/2016   Allergic rhinitis 12/13/2012   Chronic pain disorder 09/29/2012   Routine general medical examination at a health care facility 10/08/2011   Irritable bowel syndrome 10/16/2010   DDD (degenerative disc disease), lumbar 04/09/2010    Hypothyroidism 05/03/2009   Hyperlipidemia with target LDL less than 130 05/03/2009   Panic anxiety syndrome 05/03/2009    Past Medical History:  Diagnosis Date   Allergy    Anxiety    Arthritis    Cataract    Depression    Detached retina    Heart murmur    History of bilateral cataract extraction    Hyperlipidemia    Hypothyroidism    Low back pain    Panic attack    Panic attacks     Family History  Problem Relation Age of Onset   Breast cancer Mother    Hypertension Father    Heart disease Father    Asthma Father    Emphysema Father        smoker for 55 years   Diabetes Brother    Hyperlipidemia Brother    Past Surgical History:  Procedure Laterality Date   CATARACT EXTRACTION     EYE SURGERY     Social History   Social History Narrative   Caffienated drinks-no   Seat belt use often-yes   Regular Exercise-yes   Smoke alarm in the home-yes   Firearms/guns in the home-no   History of physical abuse-no               Immunization History  Administered Date(s) Administered   Influenza Whole 07/22/2012   Influenza,inj,Quad PF,6+ Mos 06/13/2017, 07/27/2018, 06/08/2019, 06/11/2021, 06/26/2022   Influenza-Unspecified 07/13/2014, 07/16/2016   PFIZER(Purple Top)SARS-COV-2 Vaccination 01/02/2020, 01/25/2020   Tdap 11/20/2011, 02/25/2022   Zoster Recombinat (Shingrix) 02/25/2022     Objective: Vital Signs: BP 112/74 (BP Location: Left Arm, Patient Position: Sitting, Cuff Size: Normal)   Pulse 82   Resp 15   Ht _0  (1.702 m)   Wt 215 lb (97.5 kg)   BMI 33.67 kg/m    Physical Exam Cardiovascular:     Rate and Rhythm: Normal rate and regular rhythm.  Pulmonary:     Effort: Pulmonary effort is normal.     Breath sounds: Normal breath sounds.  Musculoskeletal:     Right lower leg: No edema.     Left lower leg: No edema.  Skin:    General: Skin is warm and dry.     Findings: No rash.  Neurological:     Mental Status: He is alert.  Psychiatric:         Mood and Affect: Mood normal.      Musculoskeletal Exam:  Shoulders full ROM no tenderness or swelling Elbows full ROM no tenderness or swelling Wrists full ROM no tenderness or swelling Fingers full ROM no tenderness or swelling Knees full ROM no tenderness or swelling Ankles full ROM no tenderness or swelling  Limited ultrasound examination of the right elbow and bilateral knees does not show any appreciable joint effusion.  No abnormal color Doppler enhancement seen.  Investigation: No additional findings.  Imaging: No results found.  Recent Labs: Lab Results  Component Value Date   WBC 9.3 06/26/2022   HGB 13.9 06/26/2022   PLT 333.0 06/26/2022   NA 139 06/26/2022   K 3.9 06/26/2022   CL 103 06/26/2022   CO2 26 06/26/2022  GLUCOSE 100 (H) 06/26/2022   BUN 11 06/26/2022   CREATININE 0.86 06/26/2022   BILITOT 0.5 02/27/2022   ALKPHOS 73 02/27/2022   AST 14 02/27/2022   ALT 17 02/27/2022   PROT 8.1 02/27/2022   ALBUMIN 4.6 02/27/2022   CALCIUM 9.5 06/26/2022   GFRAA 94 08/16/2008    Speciality Comments: No specialty comments available.  Procedures:  No procedures performed Allergies: Effexor [venlafaxine], Fluoxetine hcl, and Prozac [fluoxetine hcl]   Assessment / Plan:     Visit Diagnoses: Elevated sedimentation rate - Plan: Ambulatory referral to Infectious Disease  Autoantibody tests have been negative to date and sedimentation rate also normalized with no specific intervention since the time of his referral.  He is still very concerned about whether he might have a chronic infectious process due to the workplace mold and stagnant water exposure the experience due to sprinkler system dripping.  The only infectious work-up we checked was ruling out Lyme disease I discussed chronic infection seems less likely with normal labs no pulmonary involvement no skin involvement.  Will refer to infectious disease so he can discuss his history of exposure with them  and if there is any appropriate testing.  Chronic pain of left knee   At this point I see no evidence to support a inflammatory cause of joint symptoms there is no effusion amenable for aspiration for fluid analysis and serologic and x-ray findings have all been unremarkable.  I recommend he can continue use of NSAID medications provided printout with discussing other supplements and devices for management of noninflammatory joint pain.  If his symptoms progress may need to consider more advanced imaging such as knee MRI.    Orders: Orders Placed This Encounter  Procedures   Ambulatory referral to Infectious Disease   No orders of the defined types were placed in this encounter.    Follow-Up Instructions: No follow-ups on file.   Collier Salina, MD  Note - This record has been created using [employer].  Chart creation errors have been sought, but may not always  have been located. Such creation errors do not reflect on  the standard of medical care.

## 2022-08-24 ENCOUNTER — Encounter: Payer: Self-pay | Admitting: Internal Medicine

## 2022-08-24 ENCOUNTER — Other Ambulatory Visit: Payer: Self-pay | Admitting: Internal Medicine

## 2022-08-24 DIAGNOSIS — M5136 Other intervertebral disc degeneration, lumbar region: Secondary | ICD-10-CM

## 2022-08-24 DIAGNOSIS — G894 Chronic pain syndrome: Secondary | ICD-10-CM

## 2022-08-24 DIAGNOSIS — G8929 Other chronic pain: Secondary | ICD-10-CM

## 2022-08-25 ENCOUNTER — Other Ambulatory Visit: Payer: Self-pay | Admitting: Internal Medicine

## 2022-08-25 DIAGNOSIS — F410 Panic disorder [episodic paroxysmal anxiety] without agoraphobia: Secondary | ICD-10-CM

## 2022-08-25 DIAGNOSIS — I10 Essential (primary) hypertension: Secondary | ICD-10-CM

## 2022-08-25 MED ORDER — OXYCODONE-ACETAMINOPHEN 5-325 MG PO TABS: 1.0000 | ORAL_TABLET | Freq: Three times a day (TID) | ORAL | 0 refills | Status: DC | PRN

## 2022-08-25 NOTE — Telephone Encounter (Signed)
Key: [id number]   Approved Effective from 08/25/2022 through 09/23/2022.

## 2022-09-01 ENCOUNTER — Ambulatory Visit: Payer: BC Managed Care – PPO | Attending: Internal Medicine | Admitting: Internal Medicine

## 2022-09-01 ENCOUNTER — Encounter: Payer: Self-pay | Admitting: Internal Medicine

## 2022-09-01 VITALS — BP 112/74 | HR 82 | Resp 15 | Ht 67.0 in | Wt 215.0 lb

## 2022-09-01 DIAGNOSIS — M25562 Pain in left knee: Secondary | ICD-10-CM | POA: Diagnosis not present

## 2022-09-01 DIAGNOSIS — L301 Dyshidrosis [pompholyx]: Secondary | ICD-10-CM

## 2022-09-01 DIAGNOSIS — M542 Cervicalgia: Secondary | ICD-10-CM

## 2022-09-01 DIAGNOSIS — G8929 Other chronic pain: Secondary | ICD-10-CM

## 2022-09-01 DIAGNOSIS — R700 Elevated erythrocyte sedimentation rate: Secondary | ICD-10-CM | POA: Insufficient documentation

## 2022-09-01 NOTE — Patient Instructions (Signed)
For osteoarthritis several treatments may be beneficial:  - Topical antiinflammatory medicine such as diclofenac or Voltaren can be applied to  affected area as needed but may be less effective than oral antiinflammatory medicine. Topical analgesics containing CBD, menthol, or lidocaine can be tried.  - Oral nonsteroidal antiinflammatory drugs (NSAIDs) such as ibuprofen, aleve, celebrex, or mobic are very helpful for osteoarthritis or tendonitis. These should be taken intermittently or as needed, and always taken with food.  - Turmeric has some antiinflammatory effect similar to NSAIDs and may help, if taken as a supplement should not be taken above recommended doses.  - Flexible compressive gloves or wraps can be helpful to support the fingers and thumb joint or the knee joint especially if hurting with certain activities or if swelling overnight.  - Physical therapy referral can discuss exercises or activity modification to improve symptoms or strength if needed.

## 2022-09-23 ENCOUNTER — Encounter: Payer: Self-pay | Admitting: Internal Medicine

## 2022-09-23 ENCOUNTER — Other Ambulatory Visit: Payer: Self-pay | Admitting: Internal Medicine

## 2022-09-23 DIAGNOSIS — G8929 Other chronic pain: Secondary | ICD-10-CM

## 2022-09-23 DIAGNOSIS — G894 Chronic pain syndrome: Secondary | ICD-10-CM

## 2022-09-23 DIAGNOSIS — M5136 Other intervertebral disc degeneration, lumbar region: Secondary | ICD-10-CM

## 2022-09-23 NOTE — Telephone Encounter (Signed)
Key: [id number]  Approved Effective from 09/23/2022 through 10/22/2022.

## 2022-09-26 MED ORDER — OXYCODONE-ACETAMINOPHEN 5-325 MG PO TABS: 1.0000 | ORAL_TABLET | Freq: Three times a day (TID) | ORAL | 0 refills | Status: AC | PRN

## 2022-10-14 ENCOUNTER — Other Ambulatory Visit: Payer: Self-pay | Admitting: Internal Medicine

## 2022-10-14 DIAGNOSIS — I10 Essential (primary) hypertension: Secondary | ICD-10-CM

## 2022-10-21 ENCOUNTER — Ambulatory Visit: Payer: BC Managed Care – PPO | Admitting: Internal Medicine

## 2022-10-21 NOTE — Progress Notes (Incomplete)
[facility] for Infectious Disease  Reason for Consult: Workplace mold and stagnant water exposure  Referring Provider: Dr Jonathan Luna   HPI:    Jonathan Luna is a 57 y.o. male with PMHx as below who presents to the clinic for further evaluation of workplace mold and water exposure.   Jonathan Luna has been evaluated recently by Dr Jonathan Luna of rheumatology for an elevated ESR in October and December 2023 in the setting of multiple joint pains.  Work up was negative for rheumatologic process and Lyme disease.  Additionally his ESR normalized with no specific intervention.  His joint pains persist but have not been worsening.  He expressed concern about chronic infectious process due to work place mold and stagnant water exposure due to a sprinkler system dripping.  Dr Jonathan Luna discussed with patient that chronic infection seemed less likely in the setting of no skin or pulmonary involvement including a negative CT chest in July.   Patient's Medications  New Prescriptions   No medications on file  Previous Medications   ASCORBIC ACID (VITAMIN C PO)    Take by mouth.   BUSPIRONE (BUSPAR) 5 MG TABLET    TAKE ONE TABLET BY MOUTH THREE TIMES DAILY   CHOLECALCIFEROL (VITAMIN D-3) 25 MCG (1000 UT) CAPS    Take 1 capsule by mouth daily.   CLONAZEPAM (KLONOPIN) 0.5 MG TABLET    TAKE ONE TABLET BY MOUTH THREE TIMES DAILY AS NEEDED FOR ANXIETY   FERRIC MALTOL (ACCRUFER) 30 MG CAPS    Take 1 capsule by mouth in the morning and at bedtime.   FLUTICASONE (FLONASE) 50 MCG/ACT NASAL SPRAY    Place 2 sprays into both nostrils daily.   INDAPAMIDE (LOZOL) 1.25 MG TABLET    TAKE ONE TABLET BY MOUTH ONE TIME DAILY   IRBESARTAN (AVAPRO) 300 MG TABLET    TAKE ONE TABLET BY MOUTH ONE TIME DAILY   LEVOTHYROXINE (SYNTHROID) 125 MCG TABLET    Take 1 tablet (125 mcg total) by mouth daily.   MULTIPLE VITAMINS-MINERALS (ZINC PO)    Take by mouth.   OMEGA-3 ACID ETHYL ESTERS (LOVAZA) 1 G CAPSULE    Take 2 capsules (2 g total) by  mouth 2 (two) times daily.   OXYCODONE-ACETAMINOPHEN (PERCOCET/ROXICET) 5-325 MG TABLET    Take 1 tablet by mouth every 8 (eight) hours as needed for severe pain.   TRAZODONE (DESYREL) 100 MG TABLET    Take 1 tablet (100 mg total) by mouth at bedtime as needed for sleep.  Modified Medications   No medications on file  Discontinued Medications   No medications on file      Past Medical History:  Diagnosis Date  . Allergy   . Anxiety   . Arthritis   . Cataract   . Depression   . Detached retina   . Heart murmur   . History of bilateral cataract extraction   . Hyperlipidemia   . Hypothyroidism   . Low back pain   . Panic attack   . Panic attacks     Social History   Tobacco Use  . Smoking status: Never    Passive exposure: Past  . Smokeless tobacco: Never  . Tobacco comments:    As a teenager  Vaping Use  . Vaping Use: Never used  Substance Use Topics  . Alcohol use: Not Currently  . Drug use: No    Family History  Problem Relation Age of Onset  . Breast cancer  Mother   . Hypertension Father   . Heart disease Father   . Asthma Father   . Emphysema Father        smoker for 45 years  . Diabetes Brother   . Hyperlipidemia Brother     Allergies  Allergen Reactions  . Effexor [Venlafaxine] Diarrhea  . Fluoxetine Hcl   . Prozac [Fluoxetine Hcl] Diarrhea    ROS    OBJECTIVE:    There were no vitals filed for this visit.   There is no height or weight on file to calculate BMI.  Physical Exam   Labs and Microbiology:     Latest Ref Rng & Units 06/26/2022    1:37 PM 04/07/2022    3:19 PM 03/20/2022    5:44 PM  CBC  WBC 4.0 - 10.5 K/uL 9.3  11.4  13.4   Hemoglobin 13.0 - 17.0 g/dL 13.9  14.0  13.0   Hematocrit 39.0 - 52.0 % 40.9  42.8  40.3   Platelets 150.0 - 400.0 K/uL 333.0  403.0  467       Latest Ref Rng & Units 06/26/2022    1:37 PM 03/20/2022    5:44 PM 02/27/2022   10:42 AM  CMP  Glucose 70 - 99 mg/dL 100  104  109   BUN 6 - 23 mg/dL '11  7   9   '$ Creatinine 0.40 - 1.50 mg/dL 0.86  0.87  0.81   Sodium 135 - 145 mEq/L 139  137  133   Potassium 3.5 - 5.1 mEq/L 3.9  3.8  4.1   Chloride 96 - 112 mEq/L 103  104  97   CO2 19 - 32 mEq/L '26  24  28   '$ Calcium 8.4 - 10.5 mg/dL 9.5  9.4  10.1   Total Protein 6.0 - 8.3 g/dL   8.1   Total Bilirubin 0.2 - 1.2 mg/dL   0.5   Alkaline Phos 39 - 117 U/L   73   AST 0 - 37 U/L   14   ALT 0 - 53 U/L   17        ASSESSMENT & PLAN:    No problem-specific Assessment & Plan notes found for this encounter.   No orders of the defined types were placed in this encounter.     Attempted to reassure patient that his work exposure does not suggest a chronic infectious process and would not recommend further work up or testing at this time.  Prior HIV and hepatitis testing in 2019 was negative. Follow up PRN.   Raynelle [facility] for Infectious [facility] 10/21/2022, 7:55 AM

## 2022-10-27 ENCOUNTER — Ambulatory Visit: Payer: BC Managed Care – PPO | Admitting: Internal Medicine

## 2022-10-27 ENCOUNTER — Encounter: Payer: Self-pay | Admitting: Internal Medicine

## 2022-10-27 VITALS — BP 128/82 | HR 97 | Temp 98.2°F | Ht 67.0 in | Wt 213.0 lb

## 2022-10-27 DIAGNOSIS — M5136 Other intervertebral disc degeneration, lumbar region: Secondary | ICD-10-CM

## 2022-10-27 DIAGNOSIS — M51369 Other intervertebral disc degeneration, lumbar region without mention of lumbar back pain or lower extremity pain: Secondary | ICD-10-CM

## 2022-10-27 DIAGNOSIS — E781 Pure hyperglyceridemia: Secondary | ICD-10-CM

## 2022-10-27 DIAGNOSIS — R739 Hyperglycemia, unspecified: Secondary | ICD-10-CM

## 2022-10-27 DIAGNOSIS — Z0000 Encounter for general adult medical examination without abnormal findings: Secondary | ICD-10-CM | POA: Diagnosis not present

## 2022-10-27 DIAGNOSIS — I10 Essential (primary) hypertension: Secondary | ICD-10-CM | POA: Diagnosis not present

## 2022-10-27 DIAGNOSIS — F410 Panic disorder [episodic paroxysmal anxiety] without agoraphobia: Secondary | ICD-10-CM

## 2022-10-27 DIAGNOSIS — E039 Hypothyroidism, unspecified: Secondary | ICD-10-CM

## 2022-10-27 DIAGNOSIS — G894 Chronic pain syndrome: Secondary | ICD-10-CM

## 2022-10-27 DIAGNOSIS — M545 Low back pain, unspecified: Secondary | ICD-10-CM

## 2022-10-27 DIAGNOSIS — G8929 Other chronic pain: Secondary | ICD-10-CM

## 2022-10-27 MED ORDER — OXYCODONE-ACETAMINOPHEN 5-325 MG PO TABS: 1.0000 | ORAL_TABLET | Freq: Three times a day (TID) | ORAL | 0 refills | Status: DC | PRN

## 2022-10-27 MED ORDER — CLONAZEPAM 0.5 MG PO TABS: 0.5000 mg | ORAL_TABLET | Freq: Three times a day (TID) | ORAL | 2 refills | Status: DC | PRN

## 2022-10-27 NOTE — Progress Notes (Signed)
Subjective:  Patient ID: Jonathan Luna, male    DOB: 19-May-1966  Age: 57 y.o. MRN: [id number]  CC: Annual Exam and Hyperlipidemia   HPI DEANNA BOEHLKE presents for a CPX and f/up -   He walks about a mile every other day and denies chest pain, shortness of breath, diaphoresis, or edema.  Outpatient Medications Prior to Visit  Medication Sig Dispense Refill   busPIRone (BUSPAR) 5 MG tablet TAKE ONE TABLET BY MOUTH THREE TIMES DAILY 270 tablet 0   Cholecalciferol (VITAMIN D-3) 25 MCG (1000 UT) CAPS Take 1 capsule by mouth daily.     Ferric Maltol (ACCRUFER) 30 MG CAPS Take 1 capsule by mouth in the morning and at bedtime. 180 capsule 1   fluticasone (FLONASE) 50 MCG/ACT nasal spray Place 2 sprays into both nostrils daily. 48 g 1   indapamide (LOZOL) 1.25 MG tablet TAKE ONE TABLET BY MOUTH ONE TIME DAILY 90 tablet 0   irbesartan (AVAPRO) 300 MG tablet TAKE ONE TABLET BY MOUTH ONE TIME DAILY 90 tablet 0   Multiple Vitamins-Minerals (ZINC PO) Take by mouth.     omega-3 acid ethyl esters (LOVAZA) 1 g capsule Take 2 capsules (2 g total) by mouth 2 (two) times daily. 360 capsule 1   traZODone (DESYREL) 100 MG tablet Take 1 tablet (100 mg total) by mouth at bedtime as needed for sleep. 90 tablet 0   Ascorbic Acid (VITAMIN C PO) Take by mouth.     clonazePAM (KLONOPIN) 0.5 MG tablet TAKE ONE TABLET BY MOUTH THREE TIMES DAILY AS NEEDED FOR ANXIETY 90 tablet 2   levothyroxine (SYNTHROID) 125 MCG tablet Take 1 tablet (125 mcg total) by mouth daily. 90 tablet 0   No facility-administered medications prior to visit.    ROS Review of Systems  Constitutional: Negative.  Negative for diaphoresis and fatigue.  HENT: Negative.    Eyes: Negative.   Respiratory:  Negative for cough, chest tightness, shortness of breath and wheezing.   Cardiovascular:  Negative for chest pain, palpitations and leg swelling.  Gastrointestinal:  Negative for abdominal pain, blood in stool, constipation and diarrhea.   Endocrine: Negative.   Genitourinary: Negative.  Negative for difficulty urinating.  Musculoskeletal:  Positive for arthralgias and back pain. Negative for myalgias and neck pain.  Skin: Negative.  Negative for color change.  Neurological: Negative.  Negative for dizziness, weakness and headaches.  Hematological:  Negative for adenopathy. Does not bruise/bleed easily.  Psychiatric/Behavioral:  Negative for confusion, decreased concentration, dysphoric mood, sleep disturbance and suicidal ideas. The patient is nervous/anxious.     Objective:  BP 128/82 (BP Location: Left Arm, Patient Position: Sitting, Cuff Size: Large)   Pulse 97   Temp 98.2 F (36.8 C) (Oral)   Ht '5\' 7"'$  (1.702 m)   Wt 213 lb (96.6 kg)   SpO2 97%   BMI 33.36 kg/m   BP Readings from Last 3 Encounters:  10/27/22 128/82  09/01/22 112/74  07/18/22 136/85    Wt Readings from Last 3 Encounters:  10/27/22 213 lb (96.6 kg)  09/01/22 215 lb (97.5 kg)  07/18/22 210 lb 6.4 oz (95.4 kg)    Physical Exam Vitals reviewed.  HENT:     Nose: Nose normal.     Mouth/Throat:     Mouth: Mucous membranes are moist.  Eyes:     General: No scleral icterus.    Conjunctiva/sclera: Conjunctivae normal.  Cardiovascular:     Rate and Rhythm: Normal rate and regular rhythm.  Heart sounds: No murmur heard. Pulmonary:     Effort: Pulmonary effort is normal.     Breath sounds: No stridor. No wheezing, rhonchi or rales.  Abdominal:     General: Abdomen is flat.     Palpations: There is no mass.     Tenderness: There is no abdominal tenderness. There is no guarding.     Hernia: No hernia is present. There is no hernia in the left inguinal area or right inguinal area.  Genitourinary:    Pubic Area: No rash.      Penis: Normal and circumcised.      Testes: Normal.     Epididymis:     Right: Normal.     Left: Normal.     Prostate: Normal. Not enlarged, not tender and no nodules present.     Rectum: Normal. Guaiac result  negative. No mass, tenderness, anal fissure, external hemorrhoid or internal hemorrhoid. Normal anal tone.  Musculoskeletal:        General: Normal range of motion.     Cervical back: Neck supple.     Right lower leg: No edema.     Left lower leg: No edema.  Lymphadenopathy:     Cervical: No cervical adenopathy.     Lower Body: No right inguinal adenopathy. No left inguinal adenopathy.  Skin:    General: Skin is warm and dry.  Neurological:     General: No focal deficit present.     Mental Status: He is alert.  Psychiatric:        Mood and Affect: Mood normal.        Behavior: Behavior normal.     Lab Results  Component Value Date   WBC 9.3 06/26/2022   HGB 13.9 06/26/2022   HCT 40.9 06/26/2022   PLT 333.0 06/26/2022   GLUCOSE 96 10/27/2022   CHOL 203 (H) 04/07/2022   TRIG 456.0 (H) 10/27/2022   HDL 38.70 (L) 04/07/2022   LDLDIRECT 150.0 04/07/2022   LDLCALC 106 (H) 04/07/2017   ALT 18 10/27/2022   AST 16 10/27/2022   NA 136 10/27/2022   K 4.5 10/27/2022   CL 99 10/27/2022   CREATININE 0.94 10/27/2022   BUN 7 10/27/2022   CO2 28 10/27/2022   TSH 6.26 (H) 10/27/2022   PSA 1.99 10/27/2022   HGBA1C 5.5 10/27/2022    CT Chest W Contrast  Result Date: 05/07/2022 CLINICAL DATA:  Chest pain, nonspecific. Table formatting from the original note was not included. Chest pain, chronic cough, wheezing, feels sore on the inside since 12/2021 No hx of surgery No hx of cancer Hx of HTN Non smoker EXAM: CT CHEST WITH CONTRAST TECHNIQUE: Multidetector CT imaging of the chest was performed during intravenous contrast administration. RADIATION DOSE REDUCTION: This exam was performed according to the departmental dose-optimization program which includes automated exposure control, adjustment of the mA and/or kV according to patient size and/or use of iterative reconstruction technique. CONTRAST:  138m ISOVUE-300 IOPAMIDOL (ISOVUE-300) INJECTION 61% COMPARISON:  Chest x-ray 03/20/2022  FINDINGS: Cardiovascular: Normal heart size. No significant pericardial effusion. The thoracic aorta is normal in caliber. No atherosclerotic plaque of the thoracic aorta. No coronary artery calcifications. Mediastinum/Nodes: No enlarged mediastinal, hilar, or axillary lymph nodes. Thyroid gland, trachea, and esophagus demonstrate no significant findings. Lungs/Pleura: No focal consolidation. Subpleural triangular micronodules likely intrapulmonary lymph nodes. No pulmonary mass. No pleural effusion. No pneumothorax. Upper Abdomen: No acute abnormality. Musculoskeletal: No chest wall abnormality. No suspicious lytic or blastic osseous lesions. No  acute displaced fracture. Multilevel mild degenerative changes of the spine. Multilevel intervertebral disc space vacuum phenomenon. IMPRESSION: No acute intrathoracic abnormality. Electronically Signed   By: Iven Finn M.D.   On: 05/07/2022 19:36   CT Soft Tissue Neck W Contrast  Result Date: 05/07/2022 CLINICAL DATA:  Left-sided neck pain with swallowing. EXAM: CT NECK WITH CONTRAST TECHNIQUE: Multidetector CT imaging of the neck was performed using the standard protocol following the bolus administration of intravenous contrast. RADIATION DOSE REDUCTION: This exam was performed according to the departmental dose-optimization program which includes automated exposure control, adjustment of the mA and/or kV according to patient size and/or use of iterative reconstruction technique. CONTRAST:  167m ISOVUE-300 IOPAMIDOL (ISOVUE-300) INJECTION 61% COMPARISON:  None Available. FINDINGS: Pharynx and larynx: Moderate motion artifact through the oropharynx. No mass identified within this limitation. Patent airway. No fluid collection or inflammatory changes in the parapharyngeal or retropharyngeal spaces. Salivary glands: Small, homogeneously enhancing nodules in the parotid glands measuring up to 10 x 7 mm on the left and 8 x 5 mm on the right favoring intraparotid  lymph nodes. Unremarkable submandibular glands. No evidence of acute inflammation or salivary stone. Thyroid: Diffusely small thyroid gland without a focal abnormality identified. Lymph nodes: Mildly prominent number and size of lymph nodes in the neck bilaterally which are not frankly enlarged (all subcentimeter in short axis) and are most notable in levels I and II, although a few mildly prominent level V nodes are also present. No abnormal lymph node density. Vascular: Major vascular structures of the neck are grossly patent. Limited intracranial: Unremarkable. Visualized orbits: Bilateral cataract extraction. Mastoids and visualized paranasal sinuses: Small mucous retention cyst in the left maxillary sinus. Clear mastoid air cells. Skeleton: Mild disc and facet degeneration in the cervical and upper thoracic spine. No suspicious osseous lesion. Upper chest: Reported separately. Other: None. IMPRESSION: Small but mildly prominent lymph nodes in the upper neck bilaterally, nonspecific but may be reactive/inflammatory in nature. No primary neck mass identified. Electronically Signed   By: ALogan BoresM.D.   On: 05/07/2022 09:47    Assessment & Plan:   TGeronimowas seen today for annual exam and hyperlipidemia.  Diagnoses and all orders for this visit:  Essential hypertension- His blood pressure is well-controlled. -     TSH; Future -     Basic metabolic panel; Future -     Hepatic function panel; Future -     Hepatic function panel -     Basic metabolic panel -     TSH  Acquired hypothyroidism- His TSH is just above 6.  Will increase the T4 dosage. -     TSH; Future -     Hepatic function panel; Future -     Hepatic function panel -     TSH -     levothyroxine (SYNTHROID) 150 MCG tablet; Take 1 tablet (150 mcg total) by mouth daily before breakfast.  Pure hyperglyceridemia- I have encouraged him to continue working on his lifestyle modifications. -     Triglycerides; Future -     Hepatic  function panel; Future -     Hepatic function panel -     Triglycerides  Routine general medical examination at a health care facility- Exam completed, labs reviewed, vaccines reviewed and updated, cancer screenings are up-to-date, patient education was given. -     PSA; Future -     PSA  Panic anxiety syndrome -     clonazePAM (KLONOPIN) 0.5 MG tablet;  Take 1 tablet (0.5 mg total) by mouth 3 (three) times daily as needed. for anxiety  Chronic hyperglycemia -     Basic metabolic panel; Future -     Hemoglobin A1c; Future -     Hepatic function panel; Future -     Hepatic function panel -     Hemoglobin A1c -     Basic metabolic panel  Chronic pain disorder -     oxyCODONE-acetaminophen (PERCOCET/ROXICET) 5-325 MG tablet; Take 1 tablet by mouth every 8 (eight) hours as needed for severe pain.  DDD (degenerative disc disease), lumbar -     oxyCODONE-acetaminophen (PERCOCET/ROXICET) 5-325 MG tablet; Take 1 tablet by mouth every 8 (eight) hours as needed for severe pain.  Chronic low back pain without sciatica, unspecified back pain laterality -     oxyCODONE-acetaminophen (PERCOCET/ROXICET) 5-325 MG tablet; Take 1 tablet by mouth every 8 (eight) hours as needed for severe pain.   I have discontinued Harland L. Hochmuth's Ascorbic Acid (VITAMIN C PO) and levothyroxine. I have also changed his clonazePAM. Additionally, I am having him start on oxyCODONE-acetaminophen and levothyroxine. Lastly, I am having him maintain his fluticasone, Multiple Vitamins-Minerals (ZINC PO), ACCRUFeR, Vitamin D-3, traZODone, omega-3 acid ethyl esters, busPIRone, irbesartan, and indapamide.  Meds ordered this encounter  Medications   clonazePAM (KLONOPIN) 0.5 MG tablet    Sig: Take 1 tablet (0.5 mg total) by mouth 3 (three) times daily as needed. for anxiety    Dispense:  90 tablet    Refill:  2   oxyCODONE-acetaminophen (PERCOCET/ROXICET) 5-325 MG tablet    Sig: Take 1 tablet by mouth every 8 (eight) hours as  needed for severe pain.    Dispense:  90 tablet    Refill:  0   levothyroxine (SYNTHROID) 150 MCG tablet    Sig: Take 1 tablet (150 mcg total) by mouth daily before breakfast.    Dispense:  90 tablet    Refill:  0     Follow-up: Return in about 3 months (around 01/26/2023).  Scarlette Calico, MD

## 2022-10-27 NOTE — Patient Instructions (Signed)
Health Maintenance, Male Adopting a healthy lifestyle and getting preventive care are important in promoting health and wellness. Ask your health care provider about: The right schedule for you to have regular tests and exams. Things you can do on your own to prevent diseases and keep yourself healthy. What should I know about diet, weight, and exercise? Eat a healthy diet  Eat a diet that includes plenty of vegetables, fruits, low-fat dairy products, and lean protein. Do not eat a lot of foods that are high in solid fats, added sugars, or sodium. Maintain a healthy weight Body mass index (BMI) is a measurement that can be used to identify possible weight problems. It estimates body fat based on height and weight. Your health care provider can help determine your BMI and help you achieve or maintain a healthy weight. Get regular exercise Get regular exercise. This is one of the most important things you can do for your health. Most adults should: Exercise for at least 150 minutes each week. The exercise should increase your heart rate and make you sweat (moderate-intensity exercise). Do strengthening exercises at least twice a week. This is in addition to the moderate-intensity exercise. Spend less time sitting. Even light physical activity can be beneficial. Watch cholesterol and blood lipids Have your blood tested for lipids and cholesterol at 57 years of age, then have this test every 5 years. You may need to have your cholesterol levels checked more often if: Your lipid or cholesterol levels are high. You are older than 57 years of age. You are at high risk for heart disease. What should I know about cancer screening? Many types of cancers can be detected early and may often be prevented. Depending on your health history and family history, you may need to have cancer screening at various ages. This may include screening for: Colorectal cancer. Prostate cancer. Skin cancer. Lung  cancer. What should I know about heart disease, diabetes, and high blood pressure? Blood pressure and heart disease High blood pressure causes heart disease and increases the risk of stroke. This is more likely to develop in people who have high blood pressure readings or are overweight. Talk with your health care provider about your target blood pressure readings. Have your blood pressure checked: Every 3-5 years if you are 18-39 years of age. Every year if you are 40 years old or older. If you are between the ages of 65 and 75 and are a current or former smoker, ask your health care provider if you should have a one-time screening for abdominal aortic aneurysm (AAA). Diabetes Have regular diabetes screenings. This checks your fasting blood sugar level. Have the screening done: Once every three years after age 45 if you are at a normal weight and have a low risk for diabetes. More often and at a younger age if you are overweight or have a high risk for diabetes. What should I know about preventing infection? Hepatitis B If you have a higher risk for hepatitis B, you should be screened for this virus. Talk with your health care provider to find out if you are at risk for hepatitis B infection. Hepatitis C Blood testing is recommended for: Everyone born from 1945 through 1965. Anyone with known risk factors for hepatitis C. Sexually transmitted infections (STIs) You should be screened each year for STIs, including gonorrhea and chlamydia, if: You are sexually active and are younger than 57 years of age. You are older than 57 years of age and your   health care provider tells you that you are at risk for this type of infection. Your sexual activity has changed since you were last screened, and you are at increased risk for chlamydia or gonorrhea. Ask your health care provider if you are at risk. Ask your health care provider about whether you are at high risk for HIV. Your health care provider  may recommend a prescription medicine to help prevent HIV infection. If you choose to take medicine to prevent HIV, you should first get tested for HIV. You should then be tested every 3 months for as long as you are taking the medicine. Follow these instructions at home: Alcohol use Do not drink alcohol if your health care provider tells you not to drink. If you drink alcohol: Limit how much you have to 0-2 drinks a day. Know how much alcohol is in your drink. In the [place] one drink equals one 12 oz bottle of beer (355 mL), one 5 oz glass of wine (148 mL), or one 1 oz glass of hard liquor (44 mL). Lifestyle Do not use any products that contain nicotine or tobacco. These products include cigarettes, chewing tobacco, and vaping devices, such as e-cigarettes. If you need help quitting, ask your health care provider. Do not use street drugs. Do not share needles. Ask your health care provider for help if you need support or information about quitting drugs. General instructions Schedule regular health, dental, and eye exams. Stay current with your vaccines. Tell your health care provider if: You often feel depressed. You have ever been abused or do not feel safe at home. Summary Adopting a healthy lifestyle and getting preventive care are important in promoting health and wellness. Follow your health care provider's instructions about healthy diet, exercising, and getting tested or screened for diseases. Follow your health care provider's instructions on monitoring your cholesterol and blood pressure. This information is not intended to replace advice given to you by your health care provider. Make sure you discuss any questions you have with your health care provider. Document Revised: 02/18/2021 Document Reviewed: 02/18/2021 Elsevier Patient Education  2023 Elsevier Inc.  

## 2022-10-28 LAB — BASIC METABOLIC PANEL
BUN: 7 mg/dL (ref 6–23)
CO2: 28 mEq/L (ref 19–32)
Calcium: 9.8 mg/dL (ref 8.4–10.5)
Chloride: 99 mEq/L (ref 96–112)
Creatinine, Ser: 0.94 mg/dL (ref 0.40–1.50)
GFR: 90.62 mL/min (ref >60.00)
Glucose, Bld: 96 mg/dL (ref 70–99)
Potassium: 4.5 mEq/L (ref 3.5–5.1)
Sodium: 136 mEq/L (ref 135–145)

## 2022-10-28 LAB — HEPATIC FUNCTION PANEL
ALT: 18 U/L (ref 0–53)
AST: 16 U/L (ref 0–37)
Albumin: 4.7 g/dL (ref 3.5–5.2)
Alkaline Phosphatase: 50 U/L (ref 39–117)
Bilirubin, Direct: 0.1 mg/dL (ref 0.0–0.3)
Total Bilirubin: 0.4 mg/dL (ref 0.2–1.2)
Total Protein: 7.4 g/dL (ref 6.0–8.3)

## 2022-10-28 LAB — TSH: TSH: 6.26 u[IU]/mL — ABNORMAL HIGH (ref 0.35–5.50)

## 2022-10-28 LAB — TRIGLYCERIDES: Triglycerides: 456 mg/dL — ABNORMAL HIGH (ref 0.0–149.0)

## 2022-10-28 LAB — PSA: PSA: 1.99 ng/mL (ref 0.10–4.00)

## 2022-10-28 LAB — HEMOGLOBIN A1C: Hgb A1c MFr Bld: 5.5 % (ref 4.6–6.5)

## 2022-10-28 MED ORDER — LEVOTHYROXINE SODIUM 150 MCG PO TABS: 150.0000 ug | ORAL_TABLET | Freq: Every day | ORAL | 0 refills | Status: DC

## 2022-11-03 ENCOUNTER — Other Ambulatory Visit: Payer: Self-pay

## 2022-11-03 ENCOUNTER — Encounter: Payer: Self-pay | Admitting: Internal Medicine

## 2022-11-03 ENCOUNTER — Ambulatory Visit: Payer: BC Managed Care – PPO | Admitting: Internal Medicine

## 2022-11-03 VITALS — BP 123/84 | HR 111 | Temp 97.8°F | Wt 221.0 lb

## 2022-11-03 DIAGNOSIS — Z77120 Contact with and (suspected) exposure to mold (toxic): Secondary | ICD-10-CM | POA: Insufficient documentation

## 2022-11-03 NOTE — Progress Notes (Signed)
[facility] Luna for Infectious Disease  Reason for Consult: Workplace mold and stagnant water exposure  Referring Provider: Dr Benjamine Mola   HPI:    Jonathan Luna is a 57 y.o. male with PMHx as below who presents to the clinic for further evaluation of workplace mold and water exposure.   [name] has been evaluated recently by Dr Benjamine Mola of rheumatology for an elevated ESR in October and December 2023 in the setting of multiple joint pains.  Work up was negative for rheumatologic process and Lyme disease.  Additionally his ESR and CRP normalized with no specific intervention.  His joint pains persist but have not been worsening.  He expressed concern about chronic infectious process due to work place mold and stagnant water exposure due to a sprinkler system dripping.  Dr Benjamine Mola discussed with patient that chronic infection seemed less likely in the setting of no skin or pulmonary involvement including a negative CT chest in July.  He presents today with no chronic infectious symptoms and no skin manifestations.  He is anxious that this exposure led to a fungal infection and wants to make sure he does not have any fungal spores in his blood.  Patient's Medications  New Prescriptions   No medications on file  Previous Medications   BUSPIRONE (BUSPAR) 5 MG TABLET    TAKE ONE TABLET BY MOUTH THREE TIMES DAILY   CHOLECALCIFEROL (VITAMIN D-3) 25 MCG (1000 UT) CAPS    Take 1 capsule by mouth daily.   CLONAZEPAM (KLONOPIN) 0.5 MG TABLET    Take 1 tablet (0.5 mg total) by mouth 3 (three) times daily as needed. for anxiety   FERRIC MALTOL (ACCRUFER) 30 MG CAPS    Take 1 capsule by mouth in the morning and at bedtime.   FLUTICASONE (FLONASE) 50 MCG/ACT NASAL SPRAY    Place 2 sprays into both nostrils daily.   INDAPAMIDE (LOZOL) 1.25 MG TABLET    TAKE ONE TABLET BY MOUTH ONE TIME DAILY   IRBESARTAN (AVAPRO) 300 MG TABLET    TAKE ONE TABLET BY MOUTH ONE TIME DAILY   LEVOTHYROXINE (SYNTHROID) 150 MCG TABLET     Take 1 tablet (150 mcg total) by mouth daily before breakfast.   MULTIPLE VITAMINS-MINERALS (ZINC PO)    Take by mouth.   OMEGA-3 ACID ETHYL ESTERS (LOVAZA) 1 G CAPSULE    Take 2 capsules (2 g total) by mouth 2 (two) times daily.   OXYCODONE-ACETAMINOPHEN (PERCOCET/ROXICET) 5-325 MG TABLET    Take 1 tablet by mouth every 8 (eight) hours as needed for severe pain.   TRAZODONE (DESYREL) 100 MG TABLET    Take 1 tablet (100 mg total) by mouth at bedtime as needed for sleep.  Modified Medications   No medications on file  Discontinued Medications   No medications on file      Past Medical History:  Diagnosis Date   Allergy    Anxiety    Arthritis    Cataract    Depression    Detached retina    Heart murmur    History of bilateral cataract extraction    Hyperlipidemia    Hypothyroidism    Low back pain    Panic attack    Panic attacks     Social History   Tobacco Use   Smoking status: Never    Passive exposure: Past   Smokeless tobacco: Never   Tobacco comments:    As a teenager  Vaping Use  Vaping Use: Never used  Substance Use Topics   Alcohol use: Not Currently   Drug use: No    Family History  Problem Relation Age of Onset   Breast cancer Mother    Hypertension Father    Heart disease Father    Asthma Father    Emphysema Father        smoker for 45 years   Diabetes Brother    Hyperlipidemia Brother     Allergies  Allergen Reactions   Effexor [Venlafaxine] Diarrhea   Fluoxetine Hcl    Prozac [Fluoxetine Hcl] Diarrhea    Review of Systems  All other systems reviewed and are negative.     OBJECTIVE:    Vitals:   11/03/22 1515  Weight: 221 lb (100.2 kg)     Body mass index is 34.61 kg/m.  Physical Exam Constitutional:      Appearance: Normal appearance.  HENT:     Head: Normocephalic and atraumatic.  Eyes:     Extraocular Movements: Extraocular movements intact.     Conjunctiva/sclera: Conjunctivae normal.  Pulmonary:     Effort:  Pulmonary effort is normal. No respiratory distress.  Abdominal:     General: There is no distension.     Palpations: Abdomen is soft.  Musculoskeletal:        General: Normal range of motion.     Cervical back: Normal range of motion and neck supple.  Skin:    General: Skin is warm and dry.     Findings: No rash.  Neurological:     General: No focal deficit present.     Mental Status: He is alert and oriented to person, place, and time.  Psychiatric:        Mood and Affect: Mood normal.        Behavior: Behavior normal.      Labs and Microbiology:     Latest Ref Rng & Units 06/26/2022    1:37 PM 04/07/2022    3:19 PM 03/20/2022    5:44 PM  CBC  WBC 4.0 - 10.5 K/uL 9.3  11.4  13.4   Hemoglobin 13.0 - 17.0 g/dL 13.9  14.0  13.0   Hematocrit 39.0 - 52.0 % 40.9  42.8  40.3   Platelets 150.0 - 400.0 K/uL 333.0  403.0  467       Latest Ref Rng & Units 10/27/2022    4:27 PM 06/26/2022    1:37 PM 03/20/2022    5:44 PM  CMP  Glucose 70 - 99 mg/dL 96  100  104   BUN 6 - 23 mg/dL '7  11  7   '$ Creatinine 0.40 - 1.50 mg/dL 0.94  0.86  0.87   Sodium 135 - 145 mEq/L 136  139  137   Potassium 3.5 - 5.1 mEq/L 4.5  3.9  3.8   Chloride 96 - 112 mEq/L 99  103  104   CO2 19 - 32 mEq/L '28  26  24   '$ Calcium 8.4 - 10.5 mg/dL 9.8  9.5  9.4   Total Protein 6.0 - 8.3 g/dL 7.4     Total Bilirubin 0.2 - 1.2 mg/dL 0.4     Alkaline Phos 39 - 117 U/L 50     AST 0 - 37 U/L 16     ALT 0 - 53 U/L 18          ASSESSMENT & PLAN:    Mold exposure  Attempted to reassure patient that his work exposure  does not suggest a chronic infectious process and would not recommend further work up or testing at this time.  Prior HIV and hepatitis testing in 2019 was negative and he declines repeat testing today. Follow up PRN.    No orders of the defined types were placed in this encounter.     Raynelle Highland for Infectious Disease [facility] 11/03/2022, 3:25 PM

## 2022-11-03 NOTE — Assessment & Plan Note (Signed)
  Attempted to reassure patient that his work exposure does not suggest a chronic infectious process and would not recommend further work up or testing at this time.  Prior HIV and hepatitis testing in 2019 was negative and he declines repeat testing today. Follow up PRN.

## 2022-11-04 DIAGNOSIS — H43813 Vitreous degeneration, bilateral: Secondary | ICD-10-CM | POA: Diagnosis not present

## 2022-11-04 DIAGNOSIS — H2703 Aphakia, bilateral: Secondary | ICD-10-CM | POA: Diagnosis not present

## 2022-11-04 DIAGNOSIS — H59812 Chorioretinal scars after surgery for detachment, left eye: Secondary | ICD-10-CM | POA: Diagnosis not present

## 2022-11-20 ENCOUNTER — Other Ambulatory Visit: Payer: Self-pay | Admitting: Internal Medicine

## 2022-11-20 DIAGNOSIS — I10 Essential (primary) hypertension: Secondary | ICD-10-CM

## 2022-11-24 ENCOUNTER — Other Ambulatory Visit: Payer: Self-pay | Admitting: Internal Medicine

## 2022-11-24 ENCOUNTER — Encounter: Payer: Self-pay | Admitting: Internal Medicine

## 2022-11-24 DIAGNOSIS — G8929 Other chronic pain: Secondary | ICD-10-CM

## 2022-11-24 DIAGNOSIS — G894 Chronic pain syndrome: Secondary | ICD-10-CM

## 2022-11-24 DIAGNOSIS — M5136 Other intervertebral disc degeneration, lumbar region: Secondary | ICD-10-CM

## 2022-11-25 ENCOUNTER — Other Ambulatory Visit: Payer: Self-pay | Admitting: Internal Medicine

## 2022-11-25 DIAGNOSIS — F410 Panic disorder [episodic paroxysmal anxiety] without agoraphobia: Secondary | ICD-10-CM

## 2022-11-25 MED ORDER — OXYCODONE-ACETAMINOPHEN 5-325 MG PO TABS: 1.0000 | ORAL_TABLET | Freq: Three times a day (TID) | ORAL | 0 refills | Status: DC | PRN

## 2022-12-05 ENCOUNTER — Other Ambulatory Visit (HOSPITAL_COMMUNITY): Payer: Self-pay

## 2022-12-05 NOTE — Telephone Encounter (Signed)
Per test claim, pt has already filled medication through insurance for full quantity. No PA submitted at this time.

## 2022-12-23 ENCOUNTER — Other Ambulatory Visit: Payer: Self-pay | Admitting: Internal Medicine

## 2022-12-23 DIAGNOSIS — G894 Chronic pain syndrome: Secondary | ICD-10-CM

## 2022-12-23 DIAGNOSIS — M545 Low back pain, unspecified: Secondary | ICD-10-CM

## 2022-12-23 DIAGNOSIS — M5136 Other intervertebral disc degeneration, lumbar region: Secondary | ICD-10-CM

## 2022-12-23 NOTE — Telephone Encounter (Signed)
Patient has recently changed from [place] to [employer]. He wanted to make sure the PA was still needed due to the insurance change. Best callback is [phone].

## 2022-12-24 MED ORDER — OXYCODONE-ACETAMINOPHEN 5-325 MG PO TABS: 1.0000 | ORAL_TABLET | Freq: Three times a day (TID) | ORAL | 0 refills | Status: DC | PRN

## 2023-01-10 ENCOUNTER — Other Ambulatory Visit: Payer: Self-pay | Admitting: Internal Medicine

## 2023-01-10 DIAGNOSIS — I10 Essential (primary) hypertension: Secondary | ICD-10-CM

## 2023-01-14 ENCOUNTER — Other Ambulatory Visit: Payer: Self-pay | Admitting: Internal Medicine

## 2023-01-14 DIAGNOSIS — E781 Pure hyperglyceridemia: Secondary | ICD-10-CM

## 2023-01-26 ENCOUNTER — Ambulatory Visit: Payer: 59 | Admitting: Internal Medicine

## 2023-01-26 ENCOUNTER — Encounter: Payer: Self-pay | Admitting: Internal Medicine

## 2023-01-26 VITALS — BP 134/84 | HR 90 | Temp 98.4°F | Resp 16 | Ht 67.0 in | Wt 207.0 lb

## 2023-01-26 DIAGNOSIS — E781 Pure hyperglyceridemia: Secondary | ICD-10-CM | POA: Diagnosis not present

## 2023-01-26 DIAGNOSIS — E785 Hyperlipidemia, unspecified: Secondary | ICD-10-CM | POA: Diagnosis not present

## 2023-01-26 DIAGNOSIS — G894 Chronic pain syndrome: Secondary | ICD-10-CM

## 2023-01-26 DIAGNOSIS — M545 Low back pain, unspecified: Secondary | ICD-10-CM

## 2023-01-26 DIAGNOSIS — F410 Panic disorder [episodic paroxysmal anxiety] without agoraphobia: Secondary | ICD-10-CM

## 2023-01-26 DIAGNOSIS — M5136 Other intervertebral disc degeneration, lumbar region: Secondary | ICD-10-CM

## 2023-01-26 DIAGNOSIS — I10 Essential (primary) hypertension: Secondary | ICD-10-CM | POA: Diagnosis not present

## 2023-01-26 DIAGNOSIS — G8929 Other chronic pain: Secondary | ICD-10-CM

## 2023-01-26 DIAGNOSIS — E039 Hypothyroidism, unspecified: Secondary | ICD-10-CM

## 2023-01-26 DIAGNOSIS — M51369 Other intervertebral disc degeneration, lumbar region without mention of lumbar back pain or lower extremity pain: Secondary | ICD-10-CM

## 2023-01-26 DIAGNOSIS — M5030 Other cervical disc degeneration, unspecified cervical region: Secondary | ICD-10-CM

## 2023-01-26 LAB — TSH: TSH: 0.56 u[IU]/mL (ref 0.35–5.50)

## 2023-01-26 MED ORDER — CLONAZEPAM 0.5 MG PO TABS: 0.5000 mg | ORAL_TABLET | Freq: Three times a day (TID) | ORAL | 2 refills | Status: DC | PRN

## 2023-01-26 MED ORDER — OXYCODONE-ACETAMINOPHEN 5-325 MG PO TABS
1.0000 | ORAL_TABLET | Freq: Three times a day (TID) | ORAL | 0 refills | Status: DC | PRN
Start: 2023-01-26 — End: 2023-02-23

## 2023-01-26 NOTE — Progress Notes (Signed)
Subjective:  Patient ID: Jonathan Luna, male    DOB: 12-29-1965  Age: 57 y.o. MRN: [id number]  CC: Hyperlipidemia, Hypertension, and Hypothyroidism   HPI Jonathan Luna presents for f/up ---  He walks 1/2 mile every other day and says his endurance is good.  He denies chest pain, shortness of breath, diaphoresis, or edema.  He has intentionally lost weight with lifestyle modifications.  He complains of chronic, nonradiating neck pain.  Outpatient Medications Prior to Visit  Medication Sig Dispense Refill   Cholecalciferol (VITAMIN D-3) 25 MCG (1000 UT) CAPS Take 1 capsule by mouth daily.     Ferric Maltol (ACCRUFER) 30 MG CAPS Take 1 capsule by mouth in the morning and at bedtime. 180 capsule 1   fluticasone (FLONASE) 50 MCG/ACT nasal spray Place 2 sprays into both nostrils daily. 48 g 1   indapamide (LOZOL) 1.25 MG tablet TAKE ONE TABLET BY MOUTH ONE TIME DAILY 90 tablet 0   irbesartan (AVAPRO) 300 MG tablet TAKE ONE TABLET BY MOUTH ONE TIME DAILY 90 tablet 0   Multiple Vitamins-Minerals (ZINC PO) Take by mouth.     omega-3 acid ethyl esters (LOVAZA) 1 g capsule TAKE TWO CAPSULES BY MOUTH TWICE DAILY 360 capsule 0   traZODone (DESYREL) 100 MG tablet Take 1 tablet (100 mg total) by mouth at bedtime as needed for sleep. 90 tablet 0   clonazePAM (KLONOPIN) 0.5 MG tablet Take 1 tablet (0.5 mg total) by mouth 3 (three) times daily as needed. for anxiety 90 tablet 2   levothyroxine (SYNTHROID) 150 MCG tablet Take 1 tablet (150 mcg total) by mouth daily before breakfast. 90 tablet 0   oxyCODONE-acetaminophen (PERCOCET/ROXICET) 5-325 MG tablet Take 1 tablet by mouth every 8 (eight) hours as needed for severe pain. 90 tablet 0   busPIRone (BUSPAR) 5 MG tablet TAKE ONE TABLET BY MOUTH THREE TIMES DAILY (Patient not taking: Reported on 01/26/2023) 270 tablet 0   No facility-administered medications prior to visit.    ROS Review of Systems  Constitutional: Negative.  Negative for diaphoresis, fatigue  and unexpected weight change.  HENT: Negative.    Eyes: Negative.   Respiratory:  Negative for chest tightness, shortness of breath and wheezing.   Cardiovascular:  Negative for chest pain, palpitations and leg swelling.  Gastrointestinal:  Negative for abdominal pain, constipation, diarrhea, nausea and vomiting.  Endocrine: Negative.   Genitourinary: Negative.  Negative for difficulty urinating.  Musculoskeletal:  Positive for arthralgias, back pain and neck pain. Negative for joint swelling and myalgias.  Skin: Negative.   Neurological: Negative.  Negative for dizziness, weakness and light-headedness.  Hematological:  Negative for adenopathy. Does not bruise/bleed easily.  Psychiatric/Behavioral:  Negative for decreased concentration, dysphoric mood, hallucinations and suicidal ideas. The patient is nervous/anxious.     Objective:  BP 134/84 (BP Location: Left Arm, Patient Position: Sitting, Cuff Size: Normal)   Pulse 90   Temp 98.4 F (36.9 C) (Oral)   Resp 16   Ht 5\' 7"  (1.702 m)   Wt 207 lb (93.9 kg)   SpO2 98%   BMI 32.42 kg/m   BP Readings from Last 3 Encounters:  01/26/23 134/84  11/03/22 123/84  10/27/22 128/82    Wt Readings from Last 3 Encounters:  01/26/23 207 lb (93.9 kg)  11/03/22 221 lb (100.2 kg)  10/27/22 213 lb (96.6 kg)    Physical Exam Vitals reviewed.  Constitutional:      Appearance: He is not ill-appearing.  HENT:  Nose: Nose normal.     Mouth/Throat:     Mouth: Mucous membranes are moist.  Eyes:     General: No scleral icterus.    Conjunctiva/sclera: Conjunctivae normal.  Cardiovascular:     Rate and Rhythm: Normal rate and regular rhythm.     Heart sounds: No murmur heard. Pulmonary:     Effort: Pulmonary effort is normal.     Breath sounds: No stridor. No wheezing, rhonchi or rales.  Abdominal:     General: Abdomen is flat.     Palpations: There is no mass.     Tenderness: There is no abdominal tenderness. There is no guarding.      Hernia: No hernia is present.  Musculoskeletal:        General: Normal range of motion.     Cervical back: Neck supple.     Right lower leg: No edema.     Left lower leg: No edema.  Lymphadenopathy:     Cervical: No cervical adenopathy.  Skin:    General: Skin is warm and dry.  Neurological:     General: No focal deficit present.     Mental Status: He is alert.  Psychiatric:        Mood and Affect: Mood normal.        Behavior: Behavior normal.        Thought Content: Thought content normal.        Judgment: Judgment normal.     Lab Results  Component Value Date   WBC 9.3 06/26/2022   HGB 13.9 06/26/2022   HCT 40.9 06/26/2022   PLT 333.0 06/26/2022   GLUCOSE 85 01/26/2023   CHOL 178 01/26/2023   TRIG 170.0 (H) 01/26/2023   HDL 40.00 01/26/2023   LDLDIRECT 150.0 04/07/2022   LDLCALC 104 (H) 01/26/2023   ALT 18 10/27/2022   AST 16 10/27/2022   NA 132 (L) 01/26/2023   K 3.7 01/26/2023   CL 95 (L) 01/26/2023   CREATININE 0.76 01/26/2023   BUN 8 01/26/2023   CO2 25 01/26/2023   TSH 0.56 01/26/2023   PSA 1.99 10/27/2022   HGBA1C 5.5 10/27/2022    CT Chest W Contrast  Result Date: 05/07/2022 CLINICAL DATA:  Chest pain, nonspecific. Table formatting from the original note was not included. Chest pain, chronic cough, wheezing, feels sore on the inside since 12/2021 No hx of surgery No hx of cancer Hx of HTN Non smoker EXAM: CT CHEST WITH CONTRAST TECHNIQUE: Multidetector CT imaging of the chest was performed during intravenous contrast administration. RADIATION DOSE REDUCTION: This exam was performed according to the departmental dose-optimization program which includes automated exposure control, adjustment of the mA and/or kV according to patient size and/or use of iterative reconstruction technique. CONTRAST:  ISOVUE-300 IOPAMIDOL (ISOVUE-300) INJECTION 61% COMPARISON:  Chest x-ray 03/20/2022 FINDINGS: Cardiovascular: Normal heart size. No significant pericardial  effusion. The thoracic aorta is normal in caliber. No atherosclerotic plaque of the thoracic aorta. No coronary artery calcifications. Mediastinum/Nodes: No enlarged mediastinal, hilar, or axillary lymph nodes. Thyroid gland, trachea, and esophagus demonstrate no significant findings. Lungs/Pleura: No focal consolidation. Subpleural triangular micronodules likely intrapulmonary lymph nodes. No pulmonary mass. No pleural effusion. No pneumothorax. Upper Abdomen: No acute abnormality. Musculoskeletal: No chest wall abnormality. No suspicious lytic or blastic osseous lesions. No acute displaced fracture. Multilevel mild degenerative changes of the spine. Multilevel intervertebral disc space vacuum phenomenon. IMPRESSION: No acute intrathoracic abnormality. Electronically Signed   By: Tish Frederickson M.D.   On:  05/07/2022 19:36   CT Soft Tissue Neck W Contrast  Result Date: 05/07/2022 CLINICAL DATA:  Left-sided neck pain with swallowing. EXAM: CT NECK WITH CONTRAST TECHNIQUE: Multidetector CT imaging of the neck was performed using the standard protocol following the bolus administration of intravenous contrast. RADIATION DOSE REDUCTION: This exam was performed according to the departmental dose-optimization program which includes automated exposure control, adjustment of the mA and/or kV according to patient size and/or use of iterative reconstruction technique. CONTRAST:  ISOVUE-300 IOPAMIDOL (ISOVUE-300) INJECTION 61% COMPARISON:  None Available. FINDINGS: Pharynx and larynx: Moderate motion artifact through the oropharynx. No mass identified within this limitation. Patent airway. No fluid collection or inflammatory changes in the parapharyngeal or retropharyngeal spaces. Salivary glands: Small, homogeneously enhancing nodules in the parotid glands measuring up to 10 x 7 mm on the left and 8 x 5 mm on the right favoring intraparotid lymph nodes. Unremarkable submandibular glands. No evidence of acute  inflammation or salivary stone. Thyroid: Diffusely small thyroid gland without a focal abnormality identified. Lymph nodes: Mildly prominent number and size of lymph nodes in the neck bilaterally which are not frankly enlarged (all subcentimeter in short axis) and are most notable in levels I and II, although a few mildly prominent level V nodes are also present. No abnormal lymph node density. Vascular: Major vascular structures of the neck are grossly patent. Limited intracranial: Unremarkable. Visualized orbits: Bilateral cataract extraction. Mastoids and visualized paranasal sinuses: Small mucous retention cyst in the left maxillary sinus. Clear mastoid air cells. Skeleton: Mild disc and facet degeneration in the cervical and upper thoracic spine. No suspicious osseous lesion. Upper chest: Reported separately. Other: None. IMPRESSION: Small but mildly prominent lymph nodes in the upper neck bilaterally, nonspecific but may be reactive/inflammatory in nature. No primary neck mass identified. Electronically Signed   By: Sebastian Ache M.D.   On: 05/07/2022 09:47    Assessment & Plan:   Essential hypertension- His blood pressure is well-controlled. -     Basic metabolic panel; Future  Acquired hypothyroidism- He is euthyroid. -     TSH; Future -     Levothyroxine Sodium; Take 1 tablet (150 mcg total) by mouth daily before breakfast.  Dispense: 90 tablet; Refill: 0  Hyperlipidemia with target LDL less than 130 -     Lipid panel; Future  Pure hyperglyceridemia -     Lipid panel; Future  Panic anxiety syndrome -     clonazePAM; Take 1 tablet (0.5 mg total) by mouth 3 (three) times daily as needed. for anxiety  Dispense: 90 tablet; Refill: 2  Chronic pain disorder -     oxyCODONE-Acetaminophen; Take 1 tablet by mouth every 8 (eight) hours as needed for severe pain.  Dispense: 90 tablet; Refill: 0  DDD (degenerative disc disease), lumbar -     oxyCODONE-Acetaminophen; Take 1 tablet by mouth every  8 (eight) hours as needed for severe pain.  Dispense: 90 tablet; Refill: 0  Chronic low back pain without sciatica, unspecified back pain laterality -     oxyCODONE-Acetaminophen; Take 1 tablet by mouth every 8 (eight) hours as needed for severe pain.  Dispense: 90 tablet; Refill: 0  DDD (degenerative disc disease), cervical -     oxyCODONE-Acetaminophen; Take 1 tablet by mouth every 8 (eight) hours as needed for severe pain.  Dispense: 90 tablet; Refill: 0 -     Ambulatory referral to Physical Therapy     Follow-up: Return in about 4 months (around 05/28/2023).  Sanda Linger,  MD

## 2023-01-26 NOTE — Patient Instructions (Signed)

## 2023-01-27 LAB — BASIC METABOLIC PANEL
BUN: 8 mg/dL (ref 6–23)
CO2: 25 mEq/L (ref 19–32)
Calcium: 9.5 mg/dL (ref 8.4–10.5)
Chloride: 95 mEq/L — ABNORMAL LOW (ref 96–112)
Creatinine, Ser: 0.76 mg/dL (ref 0.40–1.50)
GFR: 100.3 mL/min (ref >60.00)
Glucose, Bld: 85 mg/dL (ref 70–99)
Potassium: 3.7 mEq/L (ref 3.5–5.1)
Sodium: 132 mEq/L — ABNORMAL LOW (ref 135–145)

## 2023-01-27 LAB — LIPID PANEL
Cholesterol: 178 mg/dL (ref 0–200)
HDL: 40 mg/dL (ref >39.00)
LDL Cholesterol: 104 mg/dL — ABNORMAL HIGH (ref 0–99)
NonHDL: 138.07
Total CHOL/HDL Ratio: 4
Triglycerides: 170 mg/dL — ABNORMAL HIGH (ref 0.0–149.0)
VLDL: 34 mg/dL (ref 0.0–40.0)

## 2023-01-28 ENCOUNTER — Encounter: Payer: Self-pay | Admitting: Internal Medicine

## 2023-01-28 ENCOUNTER — Other Ambulatory Visit: Payer: Self-pay | Admitting: Internal Medicine

## 2023-01-28 DIAGNOSIS — E039 Hypothyroidism, unspecified: Secondary | ICD-10-CM

## 2023-01-28 MED ORDER — LEVOTHYROXINE SODIUM 150 MCG PO TABS
150.0000 ug | ORAL_TABLET | Freq: Every day | ORAL | 0 refills | Status: DC
Start: 2023-01-28 — End: 2023-05-04

## 2023-02-09 ENCOUNTER — Other Ambulatory Visit: Payer: Self-pay | Admitting: Internal Medicine

## 2023-02-09 DIAGNOSIS — I10 Essential (primary) hypertension: Secondary | ICD-10-CM

## 2023-02-16 ENCOUNTER — Ambulatory Visit: Payer: 59 | Attending: Internal Medicine | Admitting: Physical Therapy

## 2023-02-16 ENCOUNTER — Encounter: Payer: Self-pay | Admitting: Physical Therapy

## 2023-02-16 ENCOUNTER — Other Ambulatory Visit: Payer: Self-pay

## 2023-02-16 DIAGNOSIS — R293 Abnormal posture: Secondary | ICD-10-CM

## 2023-02-16 DIAGNOSIS — M542 Cervicalgia: Secondary | ICD-10-CM | POA: Insufficient documentation

## 2023-02-16 DIAGNOSIS — M6281 Muscle weakness (generalized): Secondary | ICD-10-CM | POA: Insufficient documentation

## 2023-02-16 DIAGNOSIS — M5030 Other cervical disc degeneration, unspecified cervical region: Secondary | ICD-10-CM | POA: Diagnosis not present

## 2023-02-16 NOTE — Therapy (Signed)
OUTPATIENT PHYSICAL THERAPY CERVICAL EVALUATION   Patient Name: Jonathan Luna MRN: [id number] DOB:Aug 18, 1966, 57 y.o., male Today's Date: 02/17/2023  END OF SESSION:  PT End of Session - 02/16/23 1619     Visit Number 1    Number of Visits 6    Date for PT Re-Evaluation 03/31/23    Authorization Type [employer]    PT Start Time 1619    PT Stop Time 1710    PT Time Calculation (min) 51 min    Activity Tolerance Patient tolerated treatment well             Past Medical History:  Diagnosis Date   Allergy    Anxiety    Arthritis    Cataract    Depression    Detached retina    Heart murmur    History of bilateral cataract extraction    Hyperlipidemia    Hypothyroidism    Low back pain    Panic attack    Panic attacks    Past Surgical History:  Procedure Laterality Date   CATARACT EXTRACTION     EYE SURGERY     Patient Active Problem List   Diagnosis Date Noted   Mold exposure 11/03/2022   Hypogonadism male 10/24/2021   Vitamin D deficiency disease 09/08/2018   Essential hypertension 09/07/2018   Long-term current use of opiate analgesic 04/07/2018   Encounter for long-term opiate analgesic use 08/03/2017   Colon cancer screening 12/10/2016   Pure hyperglyceridemia 09/11/2016   Low back pain 05/08/2016   Allergic rhinitis 12/13/2012   Chronic pain disorder 09/29/2012   Routine general medical examination at a health care facility 10/08/2011   Irritable bowel syndrome 10/16/2010   DDD (degenerative disc disease), lumbar 04/09/2010   Hypothyroidism 05/03/2009   Hyperlipidemia with target LDL less than 130 05/03/2009   Panic anxiety syndrome 05/03/2009    PCP: [name], MD  REFERRING PROVIDER: [name], MD  REFERRING DIAG: M50.30 (ICD-10-CM) - DDD (degenerative disc disease), cervical  THERAPY DIAG:  Abnormal posture  Cervicalgia  Muscle weakness (generalized)  Rationale for Evaluation and Treatment: Rehabilitation  ONSET DATE: ~1 month  ago  SUBJECTIVE:                                                                                                                                                                                                         SUBJECTIVE STATEMENT: Pt reports he has had off/on neck pain for ~1 year. Pt states pain is straight down the middle. Goes between shoulder blades to base  of his neck. Pt reports he gets headaches. Pt states he is not lifting weights like he used to.  Hand dominance: Right  PERTINENT HISTORY:  No prior injuries, visual impairment (history of detached retina),   PAIN:  Are you having pain? Yes: NPRS scale: 0 at best, 4 currently, 7 at worst /10 Pain location: midline lower neck Pain description: "can feel like a knife" Aggravating factors: Driving, end of day after work Relieving factors: Icy-hot, leaning to the right while driving can ease tension  PRECAUTIONS: None  WEIGHT BEARING RESTRICTIONS: No  FALLS:  Has patient fallen in last 6 months? 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#)  PLOF: Independent  PATIENT GOALS: Ease up neck pain  NEXT MD VISIT: None on chart  OBJECTIVE:   DIAGNOSTIC FINDINGS:  No new imaging in 2024  PATIENT SURVEYS:  FOTO 96  COGNITION: Overall cognitive status: Within functional limits for tasks assessed  SENSATION: WFL  POSTURE: rounded shoulders and forward head  PALPATION: Multiple trigger points in L UT, mid trap, thoracic paraspinal   CERVICAL ROM:   Active ROM A/PROM (deg) eval  Flexion 30  Extension 35  Right lateral flexion 35 pulling  Left lateral flexion 35  Right rotation 70 pulling  Left rotation 65   (Blank rows = not tested)  UPPER EXTREMITY ROM:  Active ROM Right eval Left eval  Shoulder flexion WFL * WFL *  Shoulder extension    Shoulder abduction WFL * WFL *  Shoulder adduction     Shoulder extension    Shoulder internal rotation WFL * WFL *  Shoulder external rotation [facility] WFL  Elbow flexion    Elbow extension    Wrist flexion    Wrist extension    Wrist ulnar deviation    Wrist radial deviation    Wrist pronation    Wrist supination     (Blank rows = not tested) * = increases pain with prolonged hold  UPPER EXTREMITY MMT:  MMT Right eval Left eval  Shoulder flexion 5 5  Shoulder extension    Shoulder abduction 5 5  Shoulder adduction    Shoulder extension 5 5  Shoulder internal rotation 5 5  Shoulder external rotation 4+ 4+  Middle trapezius 3+ 3+  Lower trapezius 3+ 3  Elbow flexion    Elbow extension    Wrist flexion    Wrist extension    Wrist ulnar deviation    Wrist radial deviation    Wrist pronation    Wrist supination    Grip strength     (Blank rows = not tested)  CERVICAL SPECIAL TESTS:  Spurling's test: Negative and Distraction test: Negative  FUNCTIONAL TESTS:  Did not assess  TODAY'S TREATMENT:  DATE: 02/16/23  See HEP below  PATIENT EDUCATION:  Education details: Exam findings, office ergonomics, posture, initial HEP Person educated: Patient Education method: Explanation, Demonstration, and Handouts Education comprehension: verbalized understanding, returned demonstration, and needs further education  HOME EXERCISE PROGRAM: Access Code: [email or web address] URL: [email or web address] Date: 02/17/2023 Prepared by: Vernon Prey April Kirstie Peri  Exercises - Standing Upper Trapezius Mobilization with Small Ball  - 1 x daily - 7 x weekly - 2 sets - 30 sec hold - Seated Scapular Retraction  - 1 x daily - 7 x weekly - 2 sets - 10 reps - 3 sec hold - Seated Cervical Retraction  - 1 x daily - 7 x weekly - 2 sets - 10 reps - 3 sec hold - Seated Thoracic Lumbar Extension with Pectoralis Stretch  - 1 x  daily - 7 x weekly - 2 sets - 10 reps - Standing Shoulder Horizontal Abduction with Resistance  - 1 x daily - 7 x weekly - 2 sets - 10 reps - Low Trap Setting at Wall  - 1 x daily - 7 x weekly - 2 sets - 10 reps  Patient Education - Office Posture - Cervical Body Mechanics  ASSESSMENT:  CLINICAL IMPRESSION: Patient is a 57 y.o. M who was seen today for physical therapy evaluation and treatment for lower cervical/upper thoracic pain. Assessment significant for hypomobile upper thoracic spine with muscle tension notable in L>R UT and levator scapulae. Pt demos weak posterior shoulder girdle likely leading to postural dysfunction with work and driving tasks. Pt would benefit from PT to address these issues to decrease pain and improve comfort with all daily tasks.   OBJECTIVE IMPAIRMENTS: Abnormal gait, decreased activity tolerance, decreased endurance, decreased mobility, decreased ROM, decreased strength, hypomobility, increased fascial restrictions, increased muscle spasms, impaired UE functional use, improper body mechanics, postural dysfunction, and pain.   ACTIVITY LIMITATIONS: carrying, lifting, and reach over head  PARTICIPATION LIMITATIONS: meal prep, cleaning, driving, community activity, and occupation  PERSONAL FACTORS: Fitness, Past/current experiences, Profession, and Time since onset of injury/illness/exacerbation are also affecting patient's functional outcome.   REHAB POTENTIAL: Good  CLINICAL DECISION MAKING: Stable/uncomplicated  EVALUATION COMPLEXITY: Low   GOALS: Goals reviewed with patient? Yes  SHORT TERM GOALS: Target date: 03/10/2023   Pt will be ind with initial HEP Baseline:  Goal status: INITIAL  2.  Pt will have proper desk set up for improved office ergonomics  Baseline:  Goal status: INITIAL   LONG TERM GOALS: Target date: 03/31/2023   Pt will be ind with management and progression of HEP Baseline:  Goal status: INITIAL  2.  Pt will report  decrease in pain by >/=50% (frequency and intensity) Baseline:  Goal status: INITIAL  3.  Pt will demo at least 4/5 posterior shoulder girdle strength for improved postural stability with work tasks Baseline:  Goal status: INITIAL  4.  Pt FOTO score will not drop less than 84 Baseline: 96 Goal status: INITIAL  5.  Pt will be able to lift at least 10# overhead without increased pain or guarding Baseline:  Goal status: INITIAL    PLAN:  PT FREQUENCY: 1x/week  PT DURATION: 6 weeks  PLANNED INTERVENTIONS: Therapeutic exercises, Therapeutic activity, Neuromuscular re-education, Balance training, Gait training, Patient/Family education, Self Care, Joint mobilization, Dry Needling, Electrical stimulation, Spinal mobilization, Cryotherapy, Moist heat, Taping, Ionotophoresis 4mg /ml Dexamethasone, Manual therapy, and Re-evaluation  PLAN FOR NEXT SESSION: Assess response to HEP. Manual work/TPDN (will need education) as indicated. Pt was to bring in  picture of his desk/work set up to discuss ergonomics.    Derell Bruun April Ma L Nazair Fortenberry, PT 02/17/2023, 8:40 AM

## 2023-02-23 ENCOUNTER — Other Ambulatory Visit: Payer: Self-pay | Admitting: Internal Medicine

## 2023-02-23 DIAGNOSIS — G894 Chronic pain syndrome: Secondary | ICD-10-CM

## 2023-02-23 DIAGNOSIS — M545 Low back pain, unspecified: Secondary | ICD-10-CM

## 2023-02-23 DIAGNOSIS — M5136 Other intervertebral disc degeneration, lumbar region: Secondary | ICD-10-CM

## 2023-02-23 DIAGNOSIS — M5030 Other cervical disc degeneration, unspecified cervical region: Secondary | ICD-10-CM

## 2023-02-25 MED ORDER — OXYCODONE-ACETAMINOPHEN 5-325 MG PO TABS
1.0000 | ORAL_TABLET | Freq: Three times a day (TID) | ORAL | 0 refills | Status: DC | PRN
Start: 2023-02-25 — End: 2023-03-26

## 2023-03-26 ENCOUNTER — Other Ambulatory Visit: Payer: Self-pay | Admitting: Internal Medicine

## 2023-03-26 DIAGNOSIS — M5136 Other intervertebral disc degeneration, lumbar region: Secondary | ICD-10-CM

## 2023-03-26 DIAGNOSIS — M545 Low back pain, unspecified: Secondary | ICD-10-CM

## 2023-03-26 DIAGNOSIS — M5030 Other cervical disc degeneration, unspecified cervical region: Secondary | ICD-10-CM

## 2023-03-26 DIAGNOSIS — G894 Chronic pain syndrome: Secondary | ICD-10-CM

## 2023-03-26 MED ORDER — OXYCODONE-ACETAMINOPHEN 5-325 MG PO TABS
1.0000 | ORAL_TABLET | Freq: Three times a day (TID) | ORAL | 0 refills | Status: DC | PRN
Start: 2023-03-26 — End: 2023-04-23

## 2023-04-23 ENCOUNTER — Other Ambulatory Visit: Payer: Self-pay | Admitting: Internal Medicine

## 2023-04-23 DIAGNOSIS — M51369 Other intervertebral disc degeneration, lumbar region without mention of lumbar back pain or lower extremity pain: Secondary | ICD-10-CM

## 2023-04-23 DIAGNOSIS — G894 Chronic pain syndrome: Secondary | ICD-10-CM

## 2023-04-23 DIAGNOSIS — M5136 Other intervertebral disc degeneration, lumbar region: Secondary | ICD-10-CM

## 2023-04-23 DIAGNOSIS — G8929 Other chronic pain: Secondary | ICD-10-CM

## 2023-04-23 DIAGNOSIS — F410 Panic disorder [episodic paroxysmal anxiety] without agoraphobia: Secondary | ICD-10-CM

## 2023-04-23 DIAGNOSIS — M5030 Other cervical disc degeneration, unspecified cervical region: Secondary | ICD-10-CM

## 2023-04-23 DIAGNOSIS — M545 Low back pain, unspecified: Secondary | ICD-10-CM

## 2023-04-23 MED ORDER — OXYCODONE-ACETAMINOPHEN 5-325 MG PO TABS
1.0000 | ORAL_TABLET | Freq: Three times a day (TID) | ORAL | 0 refills | Status: DC | PRN
Start: 2023-04-23 — End: 2023-05-27

## 2023-05-04 ENCOUNTER — Other Ambulatory Visit: Payer: Self-pay | Admitting: Internal Medicine

## 2023-05-04 DIAGNOSIS — E039 Hypothyroidism, unspecified: Secondary | ICD-10-CM

## 2023-05-13 ENCOUNTER — Other Ambulatory Visit: Payer: Self-pay | Admitting: Internal Medicine

## 2023-05-13 ENCOUNTER — Encounter (INDEPENDENT_AMBULATORY_CARE_PROVIDER_SITE_OTHER): Payer: Self-pay

## 2023-05-13 DIAGNOSIS — I10 Essential (primary) hypertension: Secondary | ICD-10-CM

## 2023-05-27 ENCOUNTER — Ambulatory Visit: Payer: 59 | Admitting: Internal Medicine

## 2023-05-27 ENCOUNTER — Encounter: Payer: Self-pay | Admitting: Internal Medicine

## 2023-05-27 VITALS — BP 132/74 | HR 89 | Temp 98.5°F | Resp 16 | Ht 67.0 in | Wt 206.0 lb

## 2023-05-27 DIAGNOSIS — F410 Panic disorder [episodic paroxysmal anxiety] without agoraphobia: Secondary | ICD-10-CM | POA: Diagnosis not present

## 2023-05-27 DIAGNOSIS — K635 Polyp of colon: Secondary | ICD-10-CM

## 2023-05-27 DIAGNOSIS — I10 Essential (primary) hypertension: Secondary | ICD-10-CM

## 2023-05-27 DIAGNOSIS — D508 Other iron deficiency anemias: Secondary | ICD-10-CM

## 2023-05-27 DIAGNOSIS — M5136 Other intervertebral disc degeneration, lumbar region: Secondary | ICD-10-CM

## 2023-05-27 DIAGNOSIS — G894 Chronic pain syndrome: Secondary | ICD-10-CM

## 2023-05-27 DIAGNOSIS — M5030 Other cervical disc degeneration, unspecified cervical region: Secondary | ICD-10-CM

## 2023-05-27 DIAGNOSIS — M545 Low back pain, unspecified: Secondary | ICD-10-CM

## 2023-05-27 LAB — CBC WITH DIFFERENTIAL/PLATELET
Basophils Absolute: 0.1 10*3/uL (ref 0.0–0.1)
Basophils Relative: 0.8 % (ref 0.0–3.0)
Eosinophils Absolute: 0.1 10*3/uL (ref 0.0–0.7)
Eosinophils Relative: 1.7 % (ref 0.0–5.0)
HCT: 41.9 % (ref 39.0–52.0)
Hemoglobin: 14.1 g/dL (ref 13.0–17.0)
Lymphocytes Relative: 34.3 % (ref 12.0–46.0)
Lymphs Abs: 2.8 10*3/uL (ref 0.7–4.0)
MCHC: 33.5 g/dL (ref 30.0–36.0)
MCV: 92.6 fl (ref 78.0–100.0)
Monocytes Absolute: 1 10*3/uL (ref 0.1–1.0)
Monocytes Relative: 11.8 % (ref 3.0–12.0)
Neutro Abs: 4.2 10*3/uL (ref 1.4–7.7)
Neutrophils Relative %: 51.4 % (ref 43.0–77.0)
Platelets: 373 10*3/uL (ref 150.0–400.0)
RBC: 4.53 Mil/uL (ref 4.22–5.81)
RDW: 12.6 % (ref 11.5–15.5)
WBC: 8.1 10*3/uL (ref 4.0–10.5)

## 2023-05-27 LAB — BASIC METABOLIC PANEL
BUN: 6 mg/dL (ref 6–23)
CO2: 29 mEq/L (ref 19–32)
Calcium: 9.7 mg/dL (ref 8.4–10.5)
Chloride: 97 mEq/L (ref 96–112)
Creatinine, Ser: 0.82 mg/dL (ref 0.40–1.50)
GFR: 97.8 mL/min (ref >60.00)
Glucose, Bld: 109 mg/dL — ABNORMAL HIGH (ref 70–99)
Potassium: 4.2 mEq/L (ref 3.5–5.1)
Sodium: 133 mEq/L — ABNORMAL LOW (ref 135–145)

## 2023-05-27 MED ORDER — IRBESARTAN 300 MG PO TABS
300.0000 mg | ORAL_TABLET | Freq: Every day | ORAL | 0 refills | Status: DC
Start: 2023-05-27 — End: 2023-09-04

## 2023-05-27 MED ORDER — INDAPAMIDE 1.25 MG PO TABS
1.2500 mg | ORAL_TABLET | Freq: Every day | ORAL | 0 refills | Status: DC
Start: 2023-05-27 — End: 2023-10-05

## 2023-05-27 MED ORDER — CLONAZEPAM 0.5 MG PO TABS
0.5000 mg | ORAL_TABLET | Freq: Two times a day (BID) | ORAL | 0 refills | Status: DC | PRN
Start: 2023-05-27 — End: 2023-08-21

## 2023-05-27 MED ORDER — OXYCODONE-ACETAMINOPHEN 5-325 MG PO TABS
1.0000 | ORAL_TABLET | Freq: Three times a day (TID) | ORAL | 0 refills | Status: DC | PRN
Start: 2023-05-27 — End: 2023-06-24

## 2023-05-27 NOTE — Progress Notes (Signed)
Subjective:  Patient ID: Jonathan Luna, male    DOB: 10/03/1966  Age: 57 y.o. MRN: [id number]  CC: Hypertension, Back Pain, and Osteoarthritis   HPI Jonathan Luna presents for f/up -----  Discussed the use of AI scribe software for clinical note transcription with the patient, who gave verbal consent to proceed.  History of Present Illness   The patient has been feeling generally well, with the exception of some anxiety. He has been staying active and has lost a pound since his last visit. He denies experiencing any chest pain, shortness of breath, dizziness, lightheadedness, or swelling in his legs or feet. The patient's anxiety is being managed with Klonopin, which he reports is helping, but he requests an increase in dosage. He denies taking Buspar or Trazodone, citing past negative experiences with these medications.  The patient also reports persistent low back pain, which he describes as sporadic and accompanied by a burning sensation in his left thigh. He denies any numbness, weakness, or tingling, and does not believe the back pain and thigh pain are connected. He does not wish to consider surgery or an MRI at this time.  He has been engaging in regular exercise, including weightlifting at the gym and using a sauna. He reports no significant changes in his weight and believes his thyroid dose is adequate. He is taking iron supplements every other day and had a colonoscopy six years ago, which revealed a polyp. He is unsure of the type of polyp and expresses dissatisfaction with the provider who performed the procedure.  He is currently on medication for blood pressure control and expresses a desire to eventually discontinue this medication, with a goal of significant weight reduction.       Outpatient Medications Prior to Visit  Medication Sig Dispense Refill   Cholecalciferol (VITAMIN D-3) 25 MCG (1000 UT) CAPS Take 1 capsule by mouth daily.     Ferric Maltol (ACCRUFER) 30 MG CAPS Take 1  capsule by mouth in the morning and at bedtime. 180 capsule 1   fluticasone (FLONASE) 50 MCG/ACT nasal spray Place 2 sprays into both nostrils daily. 48 g 1   levothyroxine (SYNTHROID) 150 MCG tablet TAKE ONE TABLET BY MOUTH ONCE A DAY BEFORE BREAKFAST 90 tablet 0   Multiple Vitamins-Minerals (ZINC PO) Take by mouth.     omega-3 acid ethyl esters (LOVAZA) 1 g capsule TAKE TWO CAPSULES BY MOUTH TWICE DAILY 360 capsule 0   busPIRone (BUSPAR) 5 MG tablet TAKE ONE TABLET BY MOUTH THREE TIMES DAILY 270 tablet 0   clonazePAM (KLONOPIN) 0.5 MG tablet take 1 tablet by  mouth 3 times a a day as needed for anxiety 90 tablet 0   indapamide (LOZOL) 1.25 MG tablet TAKE ONE TABLET BY MOUTH ONE TIME DAILY 90 tablet 0   irbesartan (AVAPRO) 300 MG tablet TAKE ONE TABLET BY MOUTH ONCE A DAY 90 tablet 0   oxyCODONE-acetaminophen (PERCOCET/ROXICET) 5-325 MG tablet Take 1 tablet by mouth every 8 (eight) hours as needed for severe pain. 90 tablet 0   traZODone (DESYREL) 100 MG tablet Take 1 tablet (100 mg total) by mouth at bedtime as needed for sleep. 90 tablet 0   No facility-administered medications prior to visit.    ROS Review of Systems  Constitutional: Negative.  Negative for appetite change, diaphoresis and fatigue.  HENT: Negative.    Eyes: Negative.   Respiratory:  Negative for cough, chest tightness, shortness of breath and wheezing.   Cardiovascular:  Negative  for chest pain, palpitations and leg swelling.  Gastrointestinal:  Negative for abdominal pain, constipation, diarrhea, nausea and vomiting.  Endocrine: Negative.   Genitourinary: Negative.  Negative for difficulty urinating.  Musculoskeletal:  Positive for arthralgias, back pain and neck pain. Negative for myalgias.  Skin: Negative.   Neurological:  Negative for dizziness and weakness.  Hematological:  Negative for adenopathy. Does not bruise/bleed easily.  Psychiatric/Behavioral:  Negative for confusion, decreased concentration, dysphoric  mood, sleep disturbance and suicidal ideas. The patient is nervous/anxious.     Objective:  BP 132/74 (BP Location: Right Arm, Patient Position: Sitting, Cuff Size: Large)   Pulse 89   Temp 98.5 F (36.9 C) (Oral)   Resp 16   Ht 5\' 7"  (1.702 m)   Wt 206 lb (93.4 kg)   SpO2 95%   BMI 32.26 kg/m   BP Readings from Last 3 Encounters:  05/27/23 132/74  01/26/23 134/84  11/03/22 123/84    Wt Readings from Last 3 Encounters:  05/27/23 206 lb (93.4 kg)  01/26/23 207 lb (93.9 kg)  11/03/22 221 lb (100.2 kg)    Physical Exam Vitals reviewed.  Constitutional:      Appearance: Normal appearance. He is not ill-appearing.  HENT:     Nose: Nose normal.     Mouth/Throat:     Mouth: Mucous membranes are moist.  Eyes:     General: No scleral icterus.    Conjunctiva/sclera: Conjunctivae normal.  Cardiovascular:     Rate and Rhythm: Normal rate and regular rhythm.     Heart sounds: No murmur heard. Pulmonary:     Effort: Pulmonary effort is normal.     Breath sounds: No stridor. No wheezing, rhonchi or rales.  Abdominal:     General: Abdomen is flat.     Palpations: There is no mass.     Tenderness: There is no abdominal tenderness. There is no guarding.     Hernia: No hernia is present.  Musculoskeletal:        General: Normal range of motion.     Cervical back: Neck supple.     Right lower leg: No edema.     Left lower leg: No edema.  Lymphadenopathy:     Cervical: No cervical adenopathy.  Skin:    General: Skin is warm and dry.  Neurological:     General: No focal deficit present.     Mental Status: He is alert. Mental status is at baseline.  Psychiatric:        Mood and Affect: Mood normal.        Behavior: Behavior normal.     Lab Results  Component Value Date   WBC 8.1 05/27/2023   HGB 14.1 05/27/2023   HCT 41.9 05/27/2023   PLT 373.0 05/27/2023   GLUCOSE 109 (H) 05/27/2023   CHOL 178 01/26/2023   TRIG 170.0 (H) 01/26/2023   HDL 40.00 01/26/2023    LDLDIRECT 150.0 04/07/2022   LDLCALC 104 (H) 01/26/2023   ALT 18 10/27/2022   AST 16 10/27/2022   NA 133 (L) 05/27/2023   K 4.2 05/27/2023   CL 97 05/27/2023   CREATININE 0.82 05/27/2023   BUN 6 05/27/2023   CO2 29 05/27/2023   TSH 0.56 01/26/2023   PSA 1.99 10/27/2022   HGBA1C 5.5 10/27/2022    CT Chest W Contrast  Result Date: 05/07/2022 CLINICAL DATA:  Chest pain, nonspecific. Table formatting from the original note was not included. Chest pain, chronic cough, wheezing, feels sore on  the inside since 12/2021 No hx of surgery No hx of cancer Hx of HTN Non smoker EXAM: CT CHEST WITH CONTRAST TECHNIQUE: Multidetector CT imaging of the chest was performed during intravenous contrast administration. RADIATION DOSE REDUCTION: This exam was performed according to the departmental dose-optimization program which includes automated exposure control, adjustment of the mA and/or kV according to patient size and/or use of iterative reconstruction technique. CONTRAST:  ISOVUE-300 IOPAMIDOL (ISOVUE-300) INJECTION 61% COMPARISON:  Chest x-ray 03/20/2022 FINDINGS: Cardiovascular: Normal heart size. No significant pericardial effusion. The thoracic aorta is normal in caliber. No atherosclerotic plaque of the thoracic aorta. No coronary artery calcifications. Mediastinum/Nodes: No enlarged mediastinal, hilar, or axillary lymph nodes. Thyroid gland, trachea, and esophagus demonstrate no significant findings. Lungs/Pleura: No focal consolidation. Subpleural triangular micronodules likely intrapulmonary lymph nodes. No pulmonary mass. No pleural effusion. No pneumothorax. Upper Abdomen: No acute abnormality. Musculoskeletal: No chest wall abnormality. No suspicious lytic or blastic osseous lesions. No acute displaced fracture. Multilevel mild degenerative changes of the spine. Multilevel intervertebral disc space vacuum phenomenon. IMPRESSION: No acute intrathoracic abnormality. Electronically Signed   By:  Tish Frederickson M.D.   On: 05/07/2022 19:36   CT Soft Tissue Neck W Contrast  Result Date: 05/07/2022 CLINICAL DATA:  Left-sided neck pain with swallowing. EXAM: CT NECK WITH CONTRAST TECHNIQUE: Multidetector CT imaging of the neck was performed using the standard protocol following the bolus administration of intravenous contrast. RADIATION DOSE REDUCTION: This exam was performed according to the departmental dose-optimization program which includes automated exposure control, adjustment of the mA and/or kV according to patient size and/or use of iterative reconstruction technique. CONTRAST:  ISOVUE-300 IOPAMIDOL (ISOVUE-300) INJECTION 61% COMPARISON:  None Available. FINDINGS: Pharynx and larynx: Moderate motion artifact through the oropharynx. No mass identified within this limitation. Patent airway. No fluid collection or inflammatory changes in the parapharyngeal or retropharyngeal spaces. Salivary glands: Small, homogeneously enhancing nodules in the parotid glands measuring up to 10 x 7 mm on the left and 8 x 5 mm on the right favoring intraparotid lymph nodes. Unremarkable submandibular glands. No evidence of acute inflammation or salivary stone. Thyroid: Diffusely small thyroid gland without a focal abnormality identified. Lymph nodes: Mildly prominent number and size of lymph nodes in the neck bilaterally which are not frankly enlarged (all subcentimeter in short axis) and are most notable in levels I and II, although a few mildly prominent level V nodes are also present. No abnormal lymph node density. Vascular: Major vascular structures of the neck are grossly patent. Limited intracranial: Unremarkable. Visualized orbits: Bilateral cataract extraction. Mastoids and visualized paranasal sinuses: Small mucous retention cyst in the left maxillary sinus. Clear mastoid air cells. Skeleton: Mild disc and facet degeneration in the cervical and upper thoracic spine. No suspicious osseous lesion. Upper  chest: Reported separately. Other: None. IMPRESSION: Small but mildly prominent lymph nodes in the upper neck bilaterally, nonspecific but may be reactive/inflammatory in nature. No primary neck mass identified. Electronically Signed   By: Sebastian Ache M.D.   On: 05/07/2022 09:47    Assessment & Plan:   Essential hypertension- His BP is adequately well controlled. -     Basic metabolic panel; Future -     CBC with Differential/Platelet; Future -     Indapamide; Take 1 tablet (1.25 mg total) by mouth daily.  Dispense: 90 tablet; Refill: 0 -     Irbesartan; Take 1 tablet (300 mg total) by mouth daily.  Dispense: 90 tablet; Refill: 0  Panic anxiety  syndrome -     clonazePAM; Take 1 tablet (0.5 mg total) by mouth 2 (two) times daily as needed for anxiety.  Dispense: 180 tablet; Refill: 0  Iron deficiency anemia secondary to inadequate dietary iron intake -     CBC with Differential/Platelet; Future  Polyp of colon, unspecified part of colon, unspecified type -     Ambulatory referral to Gastroenterology  Chronic pain disorder -     oxyCODONE-Acetaminophen; Take 1 tablet by mouth every 8 (eight) hours as needed for severe pain.  Dispense: 90 tablet; Refill: 0  DDD (degenerative disc disease), lumbar -     oxyCODONE-Acetaminophen; Take 1 tablet by mouth every 8 (eight) hours as needed for severe pain.  Dispense: 90 tablet; Refill: 0  Chronic low back pain without sciatica, unspecified back pain laterality -     oxyCODONE-Acetaminophen; Take 1 tablet by mouth every 8 (eight) hours as needed for severe pain.  Dispense: 90 tablet; Refill: 0  DDD (degenerative disc disease), cervical -     oxyCODONE-Acetaminophen; Take 1 tablet by mouth every 8 (eight) hours as needed for severe pain.  Dispense: 90 tablet; Refill: 0     Follow-up: Return in about 4 months (around 09/26/2023).  Sanda Linger, MD

## 2023-05-27 NOTE — Patient Instructions (Signed)
Hypertension, Adult High blood pressure (hypertension) is when the force of blood pumping through the arteries is too strong. The arteries are the blood vessels that carry blood from the heart throughout the body. Hypertension forces the heart to work harder to pump blood and may cause arteries to become narrow or stiff. Untreated or uncontrolled hypertension can lead to a heart attack, heart failure, a stroke, kidney disease, and other problems. A blood pressure reading consists of a higher number over a lower number. Ideally, your blood pressure should be below 120/80. The first ("top") number is called the systolic pressure. It is a measure of the pressure in your arteries as your heart beats. The second ("bottom") number is called the diastolic pressure. It is a measure of the pressure in your arteries as the heart relaxes. What are the causes? The exact cause of this condition is not known. There are some conditions that result in high blood pressure. What increases the risk? Certain factors may make you more likely to develop high blood pressure. Some of these risk factors are under your control, including: Smoking. Not getting enough exercise or physical activity. Being overweight. Having too much fat, sugar, calories, or salt (sodium) in your diet. Drinking too much alcohol. Other risk factors include: Having a personal history of heart disease, diabetes, high cholesterol, or kidney disease. Stress. Having a family history of high blood pressure and high cholesterol. Having obstructive sleep apnea. Age. The risk increases with age. What are the signs or symptoms? High blood pressure may not cause symptoms. Very high blood pressure (hypertensive crisis) may cause: Headache. Fast or irregular heartbeats (palpitations). Shortness of breath. Nosebleed. Nausea and vomiting. Vision changes. Severe chest pain, dizziness, and seizures. How is this diagnosed? This condition is diagnosed by  measuring your blood pressure while you are seated, with your arm resting on a flat surface, your legs uncrossed, and your feet flat on the floor. The cuff of the blood pressure monitor will be placed directly against the skin of your upper arm at the level of your heart. Blood pressure should be measured at least twice using the same arm. Certain conditions can cause a difference in blood pressure between your right and left arms. If you have a high blood pressure reading during one visit or you have normal blood pressure with other risk factors, you may be asked to: Return on a different day to have your blood pressure checked again. Monitor your blood pressure at home for 1 week or longer. If you are diagnosed with hypertension, you may have other blood or imaging tests to help your health care provider understand your overall risk for other conditions. How is this treated? This condition is treated by making healthy lifestyle changes, such as eating healthy foods, exercising more, and reducing your alcohol intake. You may be referred for counseling on a healthy diet and physical activity. Your health care provider may prescribe medicine if lifestyle changes are not enough to get your blood pressure under control and if: Your systolic blood pressure is above 130. Your diastolic blood pressure is above 80. Your personal target blood pressure may vary depending on your medical conditions, your age, and other factors. Follow these instructions at home: Eating and drinking  Eat a diet that is high in fiber and potassium, and low in sodium, added sugar, and fat. An example of this eating plan is called the DASH diet. DASH stands for Dietary Approaches to Stop Hypertension. To eat this way: Eat   plenty of fresh fruits and vegetables. Try to fill one half of your plate at each meal with fruits and vegetables. Eat whole grains, such as whole-wheat pasta, brown rice, or whole-grain bread. Fill about one  fourth of your plate with whole grains. Eat or drink low-fat dairy products, such as skim milk or low-fat yogurt. Avoid fatty cuts of meat, processed or cured meats, and poultry with skin. Fill about one fourth of your plate with lean proteins, such as fish, chicken without skin, beans, eggs, or tofu. Avoid pre-made and processed foods. These tend to be higher in sodium, added sugar, and fat. Reduce your daily sodium intake. Many people with hypertension should eat less than 1,500 mg of sodium a day. Do not drink alcohol if: Your health care provider tells you not to drink. You are pregnant, may be pregnant, or are planning to become pregnant. If you drink alcohol: Limit how much you have to: 0-1 drink a day for women. 0-2 drinks a day for men. Know how much alcohol is in your drink. In the [place] one drink equals one 12 oz bottle of beer (355 mL), one 5 oz glass of wine (148 mL), or one 1 oz glass of hard liquor (44 mL). Lifestyle  Work with your health care provider to maintain a healthy body weight or to lose weight. Ask what an ideal weight is for you. Get at least 30 minutes of exercise that causes your heart to beat faster (aerobic exercise) most days of the week. Activities may include walking, swimming, or biking. Include exercise to strengthen your muscles (resistance exercise), such as Pilates or lifting weights, as part of your weekly exercise routine. Try to do these types of exercises for 30 minutes at least 3 days a week. Do not use any products that contain nicotine or tobacco. These products include cigarettes, chewing tobacco, and vaping devices, such as e-cigarettes. If you need help quitting, ask your health care provider. Monitor your blood pressure at home as told by your health care provider. Keep all follow-up visits. This is important. Medicines Take over-the-counter and prescription medicines only as told by your health care provider. Follow directions carefully. Blood  pressure medicines must be taken as prescribed. Do not skip doses of blood pressure medicine. Doing this puts you at risk for problems and can make the medicine less effective. Ask your health care provider about side effects or reactions to medicines that you should watch for. Contact a health care provider if you: Think you are having a reaction to a medicine you are taking. Have headaches that keep coming back (recurring). Feel dizzy. Have swelling in your ankles. Have trouble with your vision. Get help right away if you: Develop a severe headache or confusion. Have unusual weakness or numbness. Feel faint. Have severe pain in your chest or abdomen. Vomit repeatedly. Have trouble breathing. These symptoms may be an emergency. Get help right away. Call 911. Do not wait to see if the symptoms will go away. Do not drive yourself to the hospital. Summary Hypertension is when the force of blood pumping through your arteries is too strong. If this condition is not controlled, it may put you at risk for serious complications. Your personal target blood pressure may vary depending on your medical conditions, your age, and other factors. For most people, a normal blood pressure is less than 120/80. Hypertension is treated with lifestyle changes, medicines, or a combination of both. Lifestyle changes include losing weight, eating a healthy,   low-sodium diet, exercising more, and limiting alcohol. This information is not intended to replace advice given to you by your health care provider. Make sure you discuss any questions you have with your health care provider. Document Revised: 08/06/2021 Document Reviewed: 08/06/2021 Elsevier Patient Education  2024 [employer]  

## 2023-06-24 ENCOUNTER — Other Ambulatory Visit: Payer: Self-pay | Admitting: Internal Medicine

## 2023-06-24 DIAGNOSIS — M5136 Other intervertebral disc degeneration, lumbar region: Secondary | ICD-10-CM

## 2023-06-24 DIAGNOSIS — G894 Chronic pain syndrome: Secondary | ICD-10-CM

## 2023-06-24 DIAGNOSIS — M545 Low back pain, unspecified: Secondary | ICD-10-CM

## 2023-06-24 DIAGNOSIS — M5030 Other cervical disc degeneration, unspecified cervical region: Secondary | ICD-10-CM

## 2023-06-30 ENCOUNTER — Telehealth: Payer: Self-pay | Admitting: Physician Assistant

## 2023-06-30 NOTE — Telephone Encounter (Signed)
Patient called to follow up on referral for colonoscopy was asking if records from [facility] were ever obtained because he had not heard from anyone yet and is wanting to schedule the colonoscopy that is due in October. Please advise.

## 2023-06-30 NOTE — Telephone Encounter (Signed)
Sorry, I have not worked with [employer].

## 2023-06-30 NOTE — Telephone Encounter (Signed)
Patient called back stating that he did have a procedure in 2018. Patient was scheduled for OV 01/09 at 3:00

## 2023-06-30 NOTE — Telephone Encounter (Signed)
Called patient to follow up spoke with [facility] and they advise the colonoscopy that was scheduled in 2018 was canceled by patient. Called patient to advise and schedule an office visit so he can follow up with provider. Left voicemail

## 2023-07-01 MED ORDER — OXYCODONE-ACETAMINOPHEN 5-325 MG PO TABS: 1.0000 | ORAL_TABLET | Freq: Three times a day (TID) | ORAL | 0 refills | Status: DC | PRN

## 2023-07-29 ENCOUNTER — Other Ambulatory Visit: Payer: Self-pay | Admitting: Internal Medicine

## 2023-07-29 DIAGNOSIS — M51369 Other intervertebral disc degeneration, lumbar region without mention of lumbar back pain or lower extremity pain: Secondary | ICD-10-CM

## 2023-07-29 DIAGNOSIS — G894 Chronic pain syndrome: Secondary | ICD-10-CM

## 2023-07-29 DIAGNOSIS — M5030 Other cervical disc degeneration, unspecified cervical region: Secondary | ICD-10-CM

## 2023-07-29 DIAGNOSIS — M545 Low back pain, unspecified: Secondary | ICD-10-CM

## 2023-07-30 MED ORDER — OXYCODONE-ACETAMINOPHEN 5-325 MG PO TABS: 1.0000 | ORAL_TABLET | Freq: Three times a day (TID) | ORAL | 0 refills | Status: DC | PRN

## 2023-08-03 ENCOUNTER — Other Ambulatory Visit: Payer: Self-pay | Admitting: Internal Medicine

## 2023-08-03 DIAGNOSIS — E039 Hypothyroidism, unspecified: Secondary | ICD-10-CM

## 2023-08-10 ENCOUNTER — Encounter: Payer: Self-pay | Admitting: Family Medicine

## 2023-08-10 ENCOUNTER — Ambulatory Visit: Payer: 59 | Admitting: Family Medicine

## 2023-08-10 VITALS — BP 132/82 | HR 90 | Temp 97.8°F | Resp 20 | Ht 67.0 in | Wt 187.0 lb

## 2023-08-10 DIAGNOSIS — Z8701 Personal history of pneumonia (recurrent): Secondary | ICD-10-CM

## 2023-08-10 DIAGNOSIS — R200 Anesthesia of skin: Secondary | ICD-10-CM | POA: Diagnosis not present

## 2023-08-10 DIAGNOSIS — F339 Major depressive disorder, recurrent, unspecified: Secondary | ICD-10-CM | POA: Diagnosis not present

## 2023-08-10 DIAGNOSIS — F410 Panic disorder [episodic paroxysmal anxiety] without agoraphobia: Secondary | ICD-10-CM | POA: Diagnosis not present

## 2023-08-10 DIAGNOSIS — R202 Paresthesia of skin: Secondary | ICD-10-CM

## 2023-08-10 DIAGNOSIS — E039 Hypothyroidism, unspecified: Secondary | ICD-10-CM

## 2023-08-10 LAB — COMPREHENSIVE METABOLIC PANEL
ALT: 24 U/L (ref 0–53)
AST: 20 U/L (ref 0–37)
Albumin: 4.4 g/dL (ref 3.5–5.2)
Alkaline Phosphatase: 100 U/L (ref 39–117)
BUN: 7 mg/dL (ref 6–23)
CO2: 28 meq/L (ref 19–32)
Calcium: 10 mg/dL (ref 8.4–10.5)
Chloride: 95 meq/L — ABNORMAL LOW (ref 96–112)
Creatinine, Ser: 0.89 mg/dL (ref 0.40–1.50)
GFR: 95.27 mL/min (ref >60.00)
Glucose, Bld: 100 mg/dL — ABNORMAL HIGH (ref 70–99)
Potassium: 3.9 meq/L (ref 3.5–5.1)
Sodium: 134 meq/L — ABNORMAL LOW (ref 135–145)
Total Bilirubin: 0.6 mg/dL (ref 0.2–1.2)
Total Protein: 8.4 g/dL — ABNORMAL HIGH (ref 6.0–8.3)

## 2023-08-10 LAB — CBC WITH DIFFERENTIAL/PLATELET
Basophils Absolute: 0.1 10*3/uL (ref 0.0–0.1)
Basophils Relative: 1.2 % (ref 0.0–3.0)
Eosinophils Absolute: 0.5 10*3/uL (ref 0.0–0.7)
Eosinophils Relative: 5.4 % — ABNORMAL HIGH (ref 0.0–5.0)
HCT: 41.7 % (ref 39.0–52.0)
Hemoglobin: 14 g/dL (ref 13.0–17.0)
Lymphocytes Relative: 32.4 % (ref 12.0–46.0)
Lymphs Abs: 3.2 10*3/uL (ref 0.7–4.0)
MCHC: 33.7 g/dL (ref 30.0–36.0)
MCV: 90.5 fL (ref 78.0–100.0)
Monocytes Absolute: 0.8 10*3/uL (ref 0.1–1.0)
Monocytes Relative: 8.5 % (ref 3.0–12.0)
Neutro Abs: 5.2 10*3/uL (ref 1.4–7.7)
Neutrophils Relative %: 52.5 % (ref 43.0–77.0)
Platelets: 545 10*3/uL — ABNORMAL HIGH (ref 150.0–400.0)
RBC: 4.61 Mil/uL (ref 4.22–5.81)
RDW: 13.3 % (ref 11.5–15.5)
WBC: 9.9 10*3/uL (ref 4.0–10.5)

## 2023-08-10 LAB — VITAMIN B12: Vitamin B-12: 501 pg/mL (ref 211–911)

## 2023-08-10 LAB — MAGNESIUM: Magnesium: 2.2 mg/dL (ref 1.5–2.5)

## 2023-08-10 LAB — TSH: TSH: 5.57 u[IU]/mL — ABNORMAL HIGH (ref 0.35–5.50)

## 2023-08-10 MED ORDER — PROMETHAZINE-DM 6.25-15 MG/5ML PO SYRP: 5.0000 mL | ORAL_SOLUTION | Freq: Four times a day (QID) | ORAL | 0 refills | Status: DC | PRN

## 2023-08-10 NOTE — Progress Notes (Addendum)
Assessment & Plan:  1. History of pneumonia Education provided on pneumonia.  Discussed cough and fatigue symptoms typically linger and can last for a couple of months.  Refilled cough syrup.  Discussed and demonstrated proper technique to use his albuterol inhaler which he was able to show back to me. - promethazine-dextromethorphan (PROMETHAZINE-DM) 6.25-15 MG/5ML syrup; Take 5 mLs by mouth 4 (four) times daily as needed for cough.  Dispense: 118 mL; Refill: 0  2. Numbness and tingling in both hands - CBC with Differential/Platelet - Comprehensive metabolic panel - TSH - Vitamin B12 - Magnesium  3-4. Panic anxiety syndrome/Recurrent major depressive disorder GeneSight testing completed today with a plan to start maintenance medication for long-term management of his anxiety and panic attacks so that he can wean off of clonazepam.   Follow up plan: Return if symptoms worsen or fail to improve.  Deliah Boston, MSN, APRN, FNP-C  Subjective:  HPI: Jonathan Luna is a 57 y.o. male presenting on 08/10/2023 for Cough (Fatigue and tired - had PNA dx 07/24/2023 - these symptoms are not getting better. He did complete a syrup for cough, augmentin 875 and alb hfa) and Numbness (Feet and hand numbness - first noticed on Saturday )  Patient reports he was treated for pneumonia on 07/24/2023 at an urgent care.  He was treated with Augmentin, promethazine cough syrup, and an albuterol inhaler.  He reports today is cough, weakness, and fatigue did not resolve with treatment.  His upper respiratory symptoms, wheezing, and shortness of breath have improved.  The promethazine cough syrup worked wonders but he has ran out.  He does not feel the albuterol inhaler is helping as much as the albuterol nebulizer treatment they gave him in the office.  Patient is also concerned that he has been experiencing some tingling in the fingers of both hands and his left foot that started 2 days ago.  Patient mentioned  his anxiety and panic attacks and that his PCP decreased his dose of clonazepam at their last visit.  He reports failing treatment with multiple SSRIs when he was in his 88s including Zoloft, Celexa, Lexapro, Prozac, and Effexor.     03/08/2019    4:17 PM 12/14/2018    3:49 PM 04/09/2017    3:56 PM  GAD 7 : Generalized Anxiety Score  Nervous, Anxious, on Edge 1 1 1   Control/stop worrying 1 1 1   Worry too much - different things 2 1 1   Trouble relaxing 1 1 1   Restless 1 1 1   Easily annoyed or irritable 1 1 1   Afraid - awful might happen 1 1 0  Total GAD 7 Score 8 7 6   Anxiety Difficulty Not difficult at all Not difficult at all       01/26/2023    3:26 PM 02/25/2022    3:54 PM 02/04/2021    4:00 PM  Depression screen PHQ 2/9  Decreased Interest 0 0 0  Down, Depressed, Hopeless 0 0 0  PHQ - 2 Score 0 0 0  Altered sleeping   0  Tired, decreased energy   0  Change in appetite   0  Feeling bad or failure about yourself    0  Trouble concentrating   0  Moving slowly or fidgety/restless   0  Suicidal thoughts   0  PHQ-9 Score   0     ROS: Negative unless specifically indicated above in HPI.   Relevant past medical history reviewed and updated as indicated.  Allergies and medications reviewed and updated.   Current Outpatient Medications:  .  Cholecalciferol (VITAMIN D-3) 25 MCG (1000 UT) CAPS, Take 1 capsule by mouth daily., Disp: , Rfl:  .  clonazePAM (KLONOPIN) 0.5 MG tablet, Take 1 tablet (0.5 mg total) by mouth 2 (two) times daily as needed for anxiety., Disp: 180 tablet, Rfl: 0 .  Ferric Maltol (ACCRUFER) 30 MG CAPS, Take 1 capsule by mouth in the morning and at bedtime., Disp: 180 capsule, Rfl: 1 .  indapamide (LOZOL) 1.25 MG tablet, Take 1 tablet (1.25 mg total) by mouth daily., Disp: 90 tablet, Rfl: 0 .  irbesartan (AVAPRO) 300 MG tablet, Take 1 tablet (300 mg total) by mouth daily., Disp: 90 tablet, Rfl: 0 .  levothyroxine (SYNTHROID) 150 MCG tablet, TAKE ONE TABLET BY  MOUTH ONCE A DAY BEFORE BREAKFAST, Disp: 90 tablet, Rfl: 0 .  Multiple Vitamins-Minerals (ZINC PO), Take by mouth., Disp: , Rfl:  .  oxyCODONE-acetaminophen (PERCOCET/ROXICET) 5-325 MG tablet, Take 1 tablet by mouth every 8 (eight) hours as needed for severe pain (pain score 7-10)., Disp: 90 tablet, Rfl: 0 .  albuterol (VENTOLIN HFA) 108 (90 Base) MCG/ACT inhaler, Inhale 2 puffs into the lungs every 4 (four) hours as needed. (Patient not taking: Reported on 08/10/2023), Disp: , Rfl:  .  fluticasone (FLONASE) 50 MCG/ACT nasal spray, Place 2 sprays into both nostrils daily. (Patient not taking: Reported on 08/10/2023), Disp: 48 g, Rfl: 1 .  omega-3 acid ethyl esters (LOVAZA) 1 g capsule, TAKE TWO CAPSULES BY MOUTH TWICE DAILY (Patient not taking: Reported on 08/10/2023), Disp: 360 capsule, Rfl: 0  Allergies  Allergen Reactions  . Effexor [Venlafaxine] Diarrhea  . Fluoxetine Hcl   . Prozac [Fluoxetine Hcl] Diarrhea    Objective:   BP 132/82   Pulse 90   Temp 97.8 F (36.6 C)   Resp 20   Ht 5\' 7"  (1.702 m)   Wt 187 lb (84.8 kg)   SpO2 94%   BMI 29.29 kg/m    Physical Exam Vitals reviewed.  Constitutional:      General: He is not in acute distress.    Appearance: Normal appearance. He is not ill-appearing, toxic-appearing or diaphoretic.  HENT:     Head: Normocephalic and atraumatic.  Eyes:     General: No scleral icterus.       Right eye: No discharge.        Left eye: No discharge.     Conjunctiva/sclera: Conjunctivae normal.  Cardiovascular:     Rate and Rhythm: Normal rate and regular rhythm.     Heart sounds: Normal heart sounds. No murmur heard.    No friction rub. No gallop.  Pulmonary:     Effort: Pulmonary effort is normal. No respiratory distress.     Breath sounds: Normal breath sounds. No stridor. No wheezing, rhonchi or rales.  Musculoskeletal:        General: Normal range of motion.     Right hand: Normal.     Left hand: Normal.     Cervical back: Normal  range of motion.     Left foot: Normal.  Skin:    General: Skin is warm and dry.  Neurological:     Mental Status: He is alert and oriented to person, place, and time. Mental status is at baseline.  Psychiatric:        Mood and Affect: Mood normal.        Behavior: Behavior normal.  Thought Content: Thought content normal.        Judgment: Judgment normal.

## 2023-08-11 NOTE — Addendum Note (Signed)
Addended by: Gwenlyn Fudge on: 08/11/2023 09:15 AM   Modules accepted: Orders

## 2023-08-17 ENCOUNTER — Telehealth: Payer: Self-pay | Admitting: Internal Medicine

## 2023-08-17 ENCOUNTER — Telehealth: Payer: Self-pay | Admitting: Family Medicine

## 2023-08-17 ENCOUNTER — Other Ambulatory Visit (INDEPENDENT_AMBULATORY_CARE_PROVIDER_SITE_OTHER): Payer: 59

## 2023-08-17 DIAGNOSIS — E039 Hypothyroidism, unspecified: Secondary | ICD-10-CM | POA: Diagnosis not present

## 2023-08-17 DIAGNOSIS — F410 Panic disorder [episodic paroxysmal anxiety] without agoraphobia: Secondary | ICD-10-CM

## 2023-08-17 DIAGNOSIS — F339 Major depressive disorder, recurrent, unspecified: Secondary | ICD-10-CM

## 2023-08-17 LAB — T4, FREE: Free T4: 1.24 ng/dL (ref 0.60–1.60)

## 2023-08-17 LAB — TSH: TSH: 0.74 u[IU]/mL (ref 0.35–5.50)

## 2023-08-17 MED ORDER — DESVENLAFAXINE SUCCINATE ER 25 MG PO TB24: 25.0000 mg | ORAL_TABLET | Freq: Every day | ORAL | 2 refills | Status: DC

## 2023-08-17 NOTE — Telephone Encounter (Addendum)
Please call patient and let him know I have received his GeneSight test results. As discussed, I will place a copy in his chart and mail him a copy. It is very clear why he has failed all the medications he has taken in the past as a majority of them are in the red category for him. I have sent in Pristiq 25 mg once daily. He should start this and continue his current dosing of clonazepam. It does not matter the time of day he takes the medication as long as it is the around the same time every day. I would also like him to pick up L-methylfolate 7.5 mg to take once daily as he does not convert folic acid as well as he should, which can contribute to his symptoms. He should keep his scheduled appointment in December.  Option at [facility]:

## 2023-08-17 NOTE — Telephone Encounter (Signed)
Pt called wanting Dr. Yetta Barre to know that he is losing feeling in the tip of her finger tips and toes. Pt also stated when he walks his legs starts to buckle. Please advise.

## 2023-08-17 NOTE — Telephone Encounter (Signed)
Pt called and aware of all info.

## 2023-08-17 NOTE — Telephone Encounter (Signed)
Patient has been scheduled for an appt.

## 2023-08-17 NOTE — Telephone Encounter (Signed)
Lab results have been mailed to patient.

## 2023-08-18 ENCOUNTER — Ambulatory Visit: Payer: 59 | Admitting: Internal Medicine

## 2023-08-18 ENCOUNTER — Ambulatory Visit: Payer: 59

## 2023-08-18 ENCOUNTER — Encounter: Payer: Self-pay | Admitting: Internal Medicine

## 2023-08-18 VITALS — BP 160/94 | HR 110 | Temp 98.0°F | Ht 67.0 in | Wt 192.6 lb

## 2023-08-18 DIAGNOSIS — R052 Subacute cough: Secondary | ICD-10-CM | POA: Diagnosis not present

## 2023-08-18 DIAGNOSIS — R000 Tachycardia, unspecified: Secondary | ICD-10-CM | POA: Diagnosis not present

## 2023-08-18 DIAGNOSIS — D75839 Thrombocytosis, unspecified: Secondary | ICD-10-CM | POA: Insufficient documentation

## 2023-08-18 DIAGNOSIS — J189 Pneumonia, unspecified organism: Secondary | ICD-10-CM

## 2023-08-18 DIAGNOSIS — G894 Chronic pain syndrome: Secondary | ICD-10-CM | POA: Diagnosis not present

## 2023-08-18 DIAGNOSIS — E871 Hypo-osmolality and hyponatremia: Secondary | ICD-10-CM | POA: Insufficient documentation

## 2023-08-18 DIAGNOSIS — F410 Panic disorder [episodic paroxysmal anxiety] without agoraphobia: Secondary | ICD-10-CM

## 2023-08-18 DIAGNOSIS — J301 Allergic rhinitis due to pollen: Secondary | ICD-10-CM

## 2023-08-18 DIAGNOSIS — I10 Essential (primary) hypertension: Secondary | ICD-10-CM

## 2023-08-18 DIAGNOSIS — Z79891 Long term (current) use of opiate analgesic: Secondary | ICD-10-CM

## 2023-08-18 HISTORY — DX: Pneumonia, unspecified organism: J18.9

## 2023-08-18 LAB — CBC WITH DIFFERENTIAL/PLATELET
Basophils Absolute: 0.1 10*3/uL (ref 0.0–0.1)
Basophils Relative: 0.6 % (ref 0.0–3.0)
Eosinophils Absolute: 0.7 10*3/uL (ref 0.0–0.7)
Eosinophils Relative: 5.9 % — ABNORMAL HIGH (ref 0.0–5.0)
HCT: 39.9 % (ref 39.0–52.0)
Hemoglobin: 13.3 g/dL (ref 13.0–17.0)
Lymphocytes Relative: 30.2 % (ref 12.0–46.0)
Lymphs Abs: 3.5 10*3/uL (ref 0.7–4.0)
MCHC: 33.4 g/dL (ref 30.0–36.0)
MCV: 91.5 fL (ref 78.0–100.0)
Monocytes Absolute: 0.8 10*3/uL (ref 0.1–1.0)
Monocytes Relative: 6.6 % (ref 3.0–12.0)
Neutro Abs: 6.6 10*3/uL (ref 1.4–7.7)
Neutrophils Relative %: 56.7 % (ref 43.0–77.0)
Platelets: 393 10*3/uL (ref 150.0–400.0)
RBC: 4.36 Mil/uL (ref 4.22–5.81)
RDW: 14.4 % (ref 11.5–15.5)
WBC: 11.6 10*3/uL — ABNORMAL HIGH (ref 4.0–10.5)

## 2023-08-18 LAB — URINALYSIS, ROUTINE W REFLEX MICROSCOPIC
Bilirubin Urine: NEGATIVE
Hgb urine dipstick: NEGATIVE
Ketones, ur: NEGATIVE
Leukocytes,Ua: NEGATIVE
Nitrite: NEGATIVE
RBC / HPF: NONE SEEN (ref 0–?)
Specific Gravity, Urine: <=1.005 — AB (ref 1.000–1.030)
Total Protein, Urine: NEGATIVE
Urine Glucose: NEGATIVE
Urobilinogen, UA: 0.2 (ref 0.0–1.0)
WBC, UA: NONE SEEN (ref 0–?)
pH: 6 (ref 5.0–8.0)

## 2023-08-18 LAB — IBC + FERRITIN
Ferritin: 231.6 ng/mL (ref 22.0–322.0)
Iron: 69 ug/dL (ref 42–165)
Saturation Ratios: 21.9 % (ref 20.0–50.0)
TIBC: 315 ug/dL (ref 250.0–450.0)
Transferrin: 225 mg/dL (ref 212.0–360.0)

## 2023-08-18 LAB — BASIC METABOLIC PANEL
BUN: 13 mg/dL (ref 6–23)
CO2: 25 meq/L (ref 19–32)
Calcium: 9.5 mg/dL (ref 8.4–10.5)
Chloride: 99 meq/L (ref 96–112)
Creatinine, Ser: 0.72 mg/dL (ref 0.40–1.50)
GFR: 101.56 mL/min (ref >60.00)
Glucose, Bld: 94 mg/dL (ref 70–99)
Potassium: 3.8 meq/L (ref 3.5–5.1)
Sodium: 135 meq/L (ref 135–145)

## 2023-08-18 MED ORDER — AMOXICILLIN-POT CLAVULANATE 875-125 MG PO TABS: 1.0000 | ORAL_TABLET | Freq: Two times a day (BID) | ORAL | 0 refills | Status: DC

## 2023-08-18 MED ORDER — FLUTICASONE PROPIONATE 50 MCG/ACT NA SUSP: 2.0000 | Freq: Every day | NASAL | 1 refills | Status: DC

## 2023-08-18 NOTE — Progress Notes (Signed)
Subjective:  Patient ID: Jonathan Luna, male    DOB: 07-12-1966  Age: 57 y.o. MRN: [id number]  CC: Hypertension and Cough   HPI RENEE BEALE presents for f/up ----  Discussed the use of AI scribe software for clinical note transcription with the patient, who gave verbal consent to proceed.  History of Present Illness   The patient, with a recent history of pneumonia, presents with persistent dry cough, predominantly nocturnal, and occasional shortness of breath. He denies fever, chills, night sweats, and chest pain. The patient reports feeling lethargic and weak, with a recent onset of tremors, particularly noticeable when writing or standing. He denies any falls or new pain. He reported that he had a chest xray but there is no evidence of a recent CXR.  The patient also reports a progressive loss of sensation and tingling in the left hand, initially affecting the fingers and now extending to the palm. Similar symptoms have started in the right hand. He also describes a burning sensation when drying between the toes after showering.  The patient has been on antibiotics since the 11th, including a Z-Pak and an inhaler. He is not taking indapamide.Marland Kitchen He received a flu shot in October. Despite the treatment, the patient's symptoms have persisted for approximately three weeks.  He also reports a significant weight loss since the last visit, attributed to the recent pneumonia episode.       Outpatient Medications Prior to Visit  Medication Sig Dispense Refill   albuterol (VENTOLIN HFA) 108 (90 Base) MCG/ACT inhaler Inhale 2 puffs into the lungs every 4 (four) hours as needed.     Cholecalciferol (VITAMIN D-3) 25 MCG (1000 UT) CAPS Take 1 capsule by mouth daily.     clonazePAM (KLONOPIN) 0.5 MG tablet Take 1 tablet (0.5 mg total) by mouth 2 (two) times daily as needed for anxiety. 180 tablet 0   desvenlafaxine (PRISTIQ) 25 MG 24 hr tablet Take 1 tablet (25 mg total) by mouth daily. 30 tablet 2    Ferric Maltol (ACCRUFER) 30 MG CAPS Take 1 capsule by mouth in the morning and at bedtime. 180 capsule 1   irbesartan (AVAPRO) 300 MG tablet Take 1 tablet (300 mg total) by mouth daily. 90 tablet 0   levothyroxine (SYNTHROID) 150 MCG tablet TAKE ONE TABLET BY MOUTH ONCE A DAY BEFORE BREAKFAST 90 tablet 0   Multiple Vitamins-Minerals (ZINC PO) Take by mouth.     oxyCODONE-acetaminophen (PERCOCET/ROXICET) 5-325 MG tablet Take 1 tablet by mouth every 8 (eight) hours as needed for severe pain (pain score 7-10). 90 tablet 0   promethazine-dextromethorphan (PROMETHAZINE-DM) 6.25-15 MG/5ML syrup Take 5 mLs by mouth 4 (four) times daily as needed for cough. 118 mL 0   indapamide (LOZOL) 1.25 MG tablet Take 1 tablet (1.25 mg total) by mouth daily. (Patient not taking: Reported on 08/18/2023) 90 tablet 0   omega-3 acid ethyl esters (LOVAZA) 1 g capsule TAKE TWO CAPSULES BY MOUTH TWICE DAILY (Patient not taking: Reported on 08/10/2023) 360 capsule 0   fluticasone (FLONASE) 50 MCG/ACT nasal spray Place 2 sprays into both nostrils daily. (Patient not taking: Reported on 08/10/2023) 48 g 1   No facility-administered medications prior to visit.    ROS Review of Systems  Constitutional:  Positive for fatigue. Negative for appetite change, chills, diaphoresis, fever and unexpected weight change.       +++ night sweats  HENT:  Positive for congestion. Negative for nosebleeds, postnasal drip, rhinorrhea, sinus pressure, sinus pain,  sore throat and trouble swallowing.   Respiratory:  Positive for cough and shortness of breath. Negative for chest tightness and wheezing.   Cardiovascular:  Negative for chest pain, palpitations and leg swelling.  Endocrine: Positive for polydipsia. Negative for polyphagia and polyuria.  Genitourinary: Negative.  Negative for difficulty urinating and dysuria.  Musculoskeletal:  Positive for arthralgias, back pain and gait problem. Negative for myalgias.  Skin:  Negative for color  change, pallor and rash.  Neurological:  Positive for dizziness and tremors. Negative for weakness, numbness and headaches.  Hematological:  Negative for adenopathy. Does not bruise/bleed easily.  Psychiatric/Behavioral:  Positive for confusion and decreased concentration. Negative for self-injury and sleep disturbance. The patient is nervous/anxious.     Objective:  BP (!) 160/94 (BP Location: Left Arm, Patient Position: Sitting, Cuff Size: Normal)   Pulse (!) 110   Temp 98 F (36.7 C) (Oral)   Ht 5\' 7"  (1.702 m)   Wt 192 lb 9.6 oz (87.4 kg)   SpO2 94%   BMI 30.17 kg/m   BP Readings from Last 3 Encounters:  08/18/23 (!) 160/94  08/10/23 132/82  05/27/23 132/74    Wt Readings from Last 3 Encounters:  08/18/23 192 lb 9.6 oz (87.4 kg)  08/10/23 187 lb (84.8 kg)  05/27/23 206 lb (93.4 kg)    Physical Exam Vitals reviewed.  Constitutional:      General: He is not in acute distress.    Appearance: He is not ill-appearing, toxic-appearing or diaphoretic.  HENT:     Right Ear: Hearing, tympanic membrane, ear canal and external ear normal.     Left Ear: Hearing, tympanic membrane, ear canal and external ear normal.     Nose: No congestion or rhinorrhea.     Right Nostril: No epistaxis.     Left Nostril: No epistaxis.     Right Sinus: No maxillary sinus tenderness or frontal sinus tenderness.     Left Sinus: No maxillary sinus tenderness or frontal sinus tenderness.     Mouth/Throat:     Mouth: Mucous membranes are moist.  Eyes:     General: No scleral icterus.    Conjunctiva/sclera: Conjunctivae normal.  Cardiovascular:     Rate and Rhythm: Regular rhythm. Tachycardia present.     Pulses: Normal pulses.     Heart sounds: No murmur heard.    No friction rub. No gallop.     Comments: EKG- ST, 116 bpm No LVH, Q waves, or ST/T waves  Pulmonary:     Effort: Pulmonary effort is normal.     Breath sounds: No stridor. No wheezing, rhonchi or rales.  Abdominal:     General:  Abdomen is flat.     Palpations: There is no mass.     Tenderness: There is no abdominal tenderness. There is no guarding.     Hernia: No hernia is present.  Musculoskeletal:        General: Normal range of motion.     Cervical back: Neck supple.     Right lower leg: No edema.     Left lower leg: No edema.  Lymphadenopathy:     Cervical: No cervical adenopathy.  Skin:    General: Skin is warm and dry.     Coloration: Skin is not jaundiced.     Findings: No rash.  Neurological:     General: No focal deficit present.     Mental Status: He is alert. Mental status is at baseline.     Cranial  Nerves: Cranial nerves 2-12 are intact. No dysarthria.     Sensory: Sensation is intact.     Motor: Tremor present. No weakness or atrophy.     Coordination: Coordination is intact. Romberg sign negative. Coordination normal. Finger-Nose-Finger Test and Heel to [facility] Test normal. Rapid alternating movements normal.     Gait: Gait is intact.     Deep Tendon Reflexes: Reflexes normal.     Reflex Scores:      Tricep reflexes are 1+ on the right side and 1+ on the left side.      Bicep reflexes are 1+ on the right side and 1+ on the left side.      Brachioradialis reflexes are 1+ on the right side and 1+ on the left side.      Patellar reflexes are 1+ on the right side and 1+ on the left side.      Achilles reflexes are 0 on the right side and 0 on the left side. Psychiatric:        Attention and Perception: He is inattentive.        Mood and Affect: Mood is anxious. Mood is not depressed. Affect is not labile, flat, angry or tearful.        Speech: Speech is delayed and tangential. Speech is not slurred.        Behavior: Behavior is slowed. Behavior is not agitated, aggressive, hyperactive or combative.        Thought Content: Thought content normal. Thought content is not paranoid or delusional. Thought content does not include homicidal or suicidal ideation. Thought content does not include homicidal  or suicidal plan.        Cognition and Memory: Cognition is impaired. Memory is impaired.        Judgment: Judgment normal.     Lab Results  Component Value Date   WBC 11.6 (H) 08/18/2023   HGB 13.3 08/18/2023   HCT 39.9 08/18/2023   PLT 393.0 08/18/2023   GLUCOSE 94 08/18/2023   CHOL 178 01/26/2023   TRIG 170.0 (H) 01/26/2023   HDL 40.00 01/26/2023   LDLDIRECT 150.0 04/07/2022   LDLCALC 104 (H) 01/26/2023   ALT 24 08/10/2023   AST 20 08/10/2023   NA 135 08/18/2023   K 3.8 08/18/2023   CL 99 08/18/2023   CREATININE 0.72 08/18/2023   BUN 13 08/18/2023   CO2 25 08/18/2023   TSH 0.74 08/17/2023   PSA 1.99 10/27/2022   HGBA1C 5.5 10/27/2022    CT Chest W Contrast  Result Date: 05/07/2022 CLINICAL DATA:  Chest pain, nonspecific. Table formatting from the original note was not included. Chest pain, chronic cough, wheezing, feels sore on the inside since 12/2021 No hx of surgery No hx of cancer Hx of HTN Non smoker EXAM: CT CHEST WITH CONTRAST TECHNIQUE: Multidetector CT imaging of the chest was performed during intravenous contrast administration. RADIATION DOSE REDUCTION: This exam was performed according to the departmental dose-optimization program which includes automated exposure control, adjustment of the mA and/or kV according to patient size and/or use of iterative reconstruction technique. CONTRAST:  ISOVUE-300 IOPAMIDOL (ISOVUE-300) INJECTION 61% COMPARISON:  Chest x-ray 03/20/2022 FINDINGS: Cardiovascular: Normal heart size. No significant pericardial effusion. The thoracic aorta is normal in caliber. No atherosclerotic plaque of the thoracic aorta. No coronary artery calcifications. Mediastinum/Nodes: No enlarged mediastinal, hilar, or axillary lymph nodes. Thyroid gland, trachea, and esophagus demonstrate no significant findings. Lungs/Pleura: No focal consolidation. Subpleural triangular micronodules likely intrapulmonary lymph nodes. No  pulmonary mass. No pleural  effusion. No pneumothorax. Upper Abdomen: No acute abnormality. Musculoskeletal: No chest wall abnormality. No suspicious lytic or blastic osseous lesions. No acute displaced fracture. Multilevel mild degenerative changes of the spine. Multilevel intervertebral disc space vacuum phenomenon. IMPRESSION: No acute intrathoracic abnormality. Electronically Signed   By: Tish Frederickson M.D.   On: 05/07/2022 19:36   CT Soft Tissue Neck W Contrast  Result Date: 05/07/2022 CLINICAL DATA:  Left-sided neck pain with swallowing. EXAM: CT NECK WITH CONTRAST TECHNIQUE: Multidetector CT imaging of the neck was performed using the standard protocol following the bolus administration of intravenous contrast. RADIATION DOSE REDUCTION: This exam was performed according to the departmental dose-optimization program which includes automated exposure control, adjustment of the mA and/or kV according to patient size and/or use of iterative reconstruction technique. CONTRAST:  ISOVUE-300 IOPAMIDOL (ISOVUE-300) INJECTION 61% COMPARISON:  None Available. FINDINGS: Pharynx and larynx: Moderate motion artifact through the oropharynx. No mass identified within this limitation. Patent airway. No fluid collection or inflammatory changes in the parapharyngeal or retropharyngeal spaces. Salivary glands: Small, homogeneously enhancing nodules in the parotid glands measuring up to 10 x 7 mm on the left and 8 x 5 mm on the right favoring intraparotid lymph nodes. Unremarkable submandibular glands. No evidence of acute inflammation or salivary stone. Thyroid: Diffusely small thyroid gland without a focal abnormality identified. Lymph nodes: Mildly prominent number and size of lymph nodes in the neck bilaterally which are not frankly enlarged (all subcentimeter in short axis) and are most notable in levels I and II, although a few mildly prominent level V nodes are also present. No abnormal lymph node density. Vascular: Major vascular  structures of the neck are grossly patent. Limited intracranial: Unremarkable. Visualized orbits: Bilateral cataract extraction. Mastoids and visualized paranasal sinuses: Small mucous retention cyst in the left maxillary sinus. Clear mastoid air cells. Skeleton: Mild disc and facet degeneration in the cervical and upper thoracic spine. No suspicious osseous lesion. Upper chest: Reported separately. Other: None. IMPRESSION: Small but mildly prominent lymph nodes in the upper neck bilaterally, nonspecific but may be reactive/inflammatory in nature. No primary neck mass identified. Electronically Signed   By: Sebastian Ache M.D.   On: 05/07/2022 09:47    DG Chest 2 View  Result Date: 08/18/2023 CLINICAL DATA:  Cough and mild shortness of breath, recent pneumonia. EXAM: CHEST - 2 VIEW COMPARISON:  Chest radiograph 03/20/2022 FINDINGS: The cardiomediastinal silhouette is normal. There is confluent airspace opacity in the right upper lobe. There is no other focal airspace opacity. There is no pulmonary edema. There is no pleural effusion or pneumothorax There is no acute osseous abnormality. IMPRESSION: Confluent airspace opacity in the right upper lobe concerning for persistent pneumonia. Recommend follow-up radiographs in 6-8 weeks to ensure resolution. If the opacity persists, CT chest may be needed to exclude neoplasm. Electronically Signed   By: Lesia Hausen M.D.   On: 08/18/2023 13:48     Assessment & Plan:  Hyponatremia -     Basic metabolic panel; Future -     Urinalysis, Routine w reflex microscopic; Future -     Sodium, urine, random; Future  Essential hypertension- He is not compliant with the thiazide diuretic. -     Basic metabolic panel; Future -     Urinalysis, Routine w reflex microscopic; Future -     EKG 12-Lead -     DRUG MONITOR, OPIATES,W/CONF, URINE; Future -     AMB Referral [facility] Care Management  Tachycardia -     EKG 12-Lead -     DRUG MONITOR, OPIATES,W/CONF, URINE;  Future  Chronic pain disorder -     Oxycodone/Oxymorphone, Urine; Future -     Urine drugs of abuse scrn w alc, routine (Ref Lab); Future  Panic anxiety syndrome -     DRUG MONITOR, OPIATES,W/CONF, URINE; Future  Long-term current use of opiate analgesic -     Oxycodone/Oxymorphone, Urine; Future -     DRUG MONITOR, OPIATES,W/CONF, URINE; Future -     Urine drugs of abuse scrn w alc, routine (Ref Lab); Future  Subacute cough- RUL PNA. -     DG Chest 2 View; Future  Thrombocytosis -     Lactate dehydrogenase; Future -     CBC with Differential/Platelet; Future -     Protein electrophoresis, serum; Future -     IBC + Ferritin; Future  Non-seasonal allergic rhinitis due to pollen -     Fluticasone Propionate; Place 2 sprays into both nostrils daily.  Dispense: 48 g; Refill: 1  Pneumonia of right upper lobe due to infectious organism -     Amoxicillin-Pot Clavulanate; Take 1 tablet by mouth 2 (two) times daily for 10 days.  Dispense: 20 tablet; Refill: 0     Follow-up: No follow-ups on file.  Sanda Linger, MD

## 2023-08-19 ENCOUNTER — Other Ambulatory Visit: Payer: Self-pay | Admitting: Family Medicine

## 2023-08-19 DIAGNOSIS — Z8701 Personal history of pneumonia (recurrent): Secondary | ICD-10-CM

## 2023-08-19 LAB — URINE DRUGS OF ABUSE SCREEN W ALC, ROUTINE (REF LAB)
Amphetamines, Urine: NEGATIVE ng/mL
Barbiturate Quant, Ur: NEGATIVE ng/mL
Benzodiazepine Quant, Ur: NEGATIVE ng/mL
Cannabinoid Quant, Ur: NEGATIVE ng/mL
Cocaine (Metab.): NEGATIVE ng/mL
Ethanol, Urine: NEGATIVE %
Methadone Screen, Urine: NEGATIVE ng/mL
Opiate Quant, Ur: NEGATIVE ng/mL
PCP Quant, Ur: NEGATIVE ng/mL
Propoxyphene: NEGATIVE ng/mL

## 2023-08-19 LAB — OXYCODONE/OXYMORPHONE, URINE: Oxycodone+Oxymorphone Ur Ql Scn: POSITIVE ng/mL — AB

## 2023-08-19 MED ORDER — PROMETHAZINE-DM 6.25-15 MG/5ML PO SYRP: 5.0000 mL | ORAL_SOLUTION | Freq: Four times a day (QID) | ORAL | 0 refills | Status: DC | PRN

## 2023-08-20 LAB — PROTEIN ELECTROPHORESIS, SERUM
Albumin ELP: 4.3 g/dL (ref 3.8–4.8)
Alpha 1: 0.3 g/dL (ref 0.2–0.3)
Alpha 2: 0.6 g/dL (ref 0.5–0.9)
Beta 2: 0.6 g/dL — ABNORMAL HIGH (ref 0.2–0.5)
Beta Globulin: 0.5 g/dL (ref 0.4–0.6)
Gamma Globulin: 1.1 g/dL (ref 0.8–1.7)
Total Protein: 7.5 g/dL (ref 6.1–8.1)

## 2023-08-20 LAB — SODIUM, URINE, RANDOM: Sodium, Ur: 14 mmol/L — ABNORMAL LOW (ref 28–272)

## 2023-08-20 LAB — DM TEMPLATE

## 2023-08-20 LAB — DRUG MONITOR, OPIATES,W/CONF, URINE
Codeine: NEGATIVE ng/mL (ref <50)
Hydrocodone: NEGATIVE ng/mL (ref <50)
Hydromorphone: NEGATIVE ng/mL (ref <50)
Morphine: NEGATIVE ng/mL (ref <50)
Norhydrocodone: NEGATIVE ng/mL (ref <50)
Opiates: NEGATIVE ng/mL (ref <100)

## 2023-08-20 LAB — LACTATE DEHYDROGENASE: LDH: 158 U/L (ref 120–250)

## 2023-08-21 ENCOUNTER — Telehealth: Payer: Self-pay

## 2023-08-21 ENCOUNTER — Other Ambulatory Visit: Payer: Self-pay | Admitting: Internal Medicine

## 2023-08-21 ENCOUNTER — Encounter: Payer: Self-pay | Admitting: Family Medicine

## 2023-08-21 DIAGNOSIS — F410 Panic disorder [episodic paroxysmal anxiety] without agoraphobia: Secondary | ICD-10-CM

## 2023-08-21 MED ORDER — CLONAZEPAM 0.5 MG PO TABS: 0.5000 mg | ORAL_TABLET | Freq: Two times a day (BID) | ORAL | 0 refills | Status: DC | PRN

## 2023-08-21 NOTE — Progress Notes (Signed)
   Care Guide Note  08/21/2023 Name: Jonathan Luna MRN: [id number] DOB: 03-06-1966  Referred by: [name], MD Reason for referral : Care Coordination (Outreach to schedule with Pharm d )   Jonathan Luna is a 57 y.o. year old male who is a primary care patient of [name], MD. Jonathan Luna was referred to the pharmacist for assistance related to HTN.    Successful contact was made with the patient to discuss pharmacy services including being ready for the pharmacist to call at least 5 minutes before the scheduled appointment time, to have medication bottles and any blood sugar or blood pressure readings ready for review. The patient agreed to meet with the pharmacist via with the pharmacist via telephone visit on (date/time).  08/28/2023  Penne Lash, RMA Care Guide [facility]  [place], [place] [address] Direct Dial: [phone] [email or web address]

## 2023-08-26 ENCOUNTER — Encounter: Payer: Self-pay | Admitting: Internal Medicine

## 2023-08-27 NOTE — Telephone Encounter (Signed)
Patient was told by Dr. Yetta Barre - see message to follow up soon - Can he be booked on Dr. Yetta Barre tomorrow - but there are no open appointments - Please call patient and let him know.  Carollee Herter or Delice Bison will need to book the appointment because they have access to open schedule

## 2023-08-27 NOTE — Telephone Encounter (Signed)
Patient has been scheduled for tomorrow at 9:40

## 2023-08-28 ENCOUNTER — Ambulatory Visit: Payer: 59 | Admitting: Internal Medicine

## 2023-08-28 ENCOUNTER — Other Ambulatory Visit: Payer: 59

## 2023-08-28 ENCOUNTER — Inpatient Hospital Stay (HOSPITAL_COMMUNITY)
Admission: EM | Admit: 2023-08-28 | Discharge: 2023-08-30 | DRG: 871 | Disposition: A | Discharge status: Home / Self Care | Payer: 59 | Attending: Internal Medicine | Admitting: Internal Medicine

## 2023-08-28 ENCOUNTER — Telehealth: Payer: Self-pay | Admitting: Internal Medicine

## 2023-08-28 ENCOUNTER — Encounter (HOSPITAL_COMMUNITY): Payer: Self-pay | Admitting: Internal Medicine

## 2023-08-28 ENCOUNTER — Encounter: Payer: Self-pay | Admitting: Internal Medicine

## 2023-08-28 ENCOUNTER — Emergency Department (HOSPITAL_COMMUNITY): Payer: 59

## 2023-08-28 ENCOUNTER — Other Ambulatory Visit: Payer: Self-pay

## 2023-08-28 VITALS — BP 150/98 | HR 127 | Temp 98.0°F | Ht 67.0 in | Wt 192.2 lb

## 2023-08-28 DIAGNOSIS — Z86711 Personal history of pulmonary embolism: Secondary | ICD-10-CM | POA: Diagnosis not present

## 2023-08-28 DIAGNOSIS — Z8249 Family history of ischemic heart disease and other diseases of the circulatory system: Secondary | ICD-10-CM | POA: Diagnosis not present

## 2023-08-28 DIAGNOSIS — Z1152 Encounter for screening for COVID-19: Secondary | ICD-10-CM | POA: Diagnosis not present

## 2023-08-28 DIAGNOSIS — E872 Acidosis, unspecified: Secondary | ICD-10-CM | POA: Diagnosis present

## 2023-08-28 DIAGNOSIS — I10 Essential (primary) hypertension: Secondary | ICD-10-CM

## 2023-08-28 DIAGNOSIS — E785 Hyperlipidemia, unspecified: Secondary | ICD-10-CM | POA: Diagnosis present

## 2023-08-28 DIAGNOSIS — Z79890 Hormone replacement therapy (postmenopausal): Secondary | ICD-10-CM

## 2023-08-28 DIAGNOSIS — Z7722 Contact with and (suspected) exposure to environmental tobacco smoke (acute) (chronic): Secondary | ICD-10-CM | POA: Diagnosis present

## 2023-08-28 DIAGNOSIS — Z6830 Body mass index (BMI) 30.0-30.9, adult: Secondary | ICD-10-CM

## 2023-08-28 DIAGNOSIS — Z7982 Long term (current) use of aspirin: Secondary | ICD-10-CM

## 2023-08-28 DIAGNOSIS — Z79899 Other long term (current) drug therapy: Secondary | ICD-10-CM

## 2023-08-28 DIAGNOSIS — Z888 Allergy status to other drugs, medicaments and biological substances status: Secondary | ICD-10-CM

## 2023-08-28 DIAGNOSIS — F32A Depression, unspecified: Secondary | ICD-10-CM | POA: Diagnosis present

## 2023-08-28 DIAGNOSIS — A419 Sepsis, unspecified organism: Principal | ICD-10-CM | POA: Diagnosis present

## 2023-08-28 DIAGNOSIS — Z8701 Personal history of pneumonia (recurrent): Secondary | ICD-10-CM | POA: Diagnosis not present

## 2023-08-28 DIAGNOSIS — E876 Hypokalemia: Secondary | ICD-10-CM | POA: Diagnosis present

## 2023-08-28 DIAGNOSIS — E669 Obesity, unspecified: Secondary | ICD-10-CM | POA: Diagnosis present

## 2023-08-28 DIAGNOSIS — J189 Pneumonia, unspecified organism: Principal | ICD-10-CM | POA: Diagnosis present

## 2023-08-28 DIAGNOSIS — E871 Hypo-osmolality and hyponatremia: Secondary | ICD-10-CM | POA: Diagnosis present

## 2023-08-28 DIAGNOSIS — E039 Hypothyroidism, unspecified: Secondary | ICD-10-CM | POA: Diagnosis present

## 2023-08-28 DIAGNOSIS — Z713 Dietary counseling and surveillance: Secondary | ICD-10-CM

## 2023-08-28 DIAGNOSIS — Z83438 Family history of other disorder of lipoprotein metabolism and other lipidemia: Secondary | ICD-10-CM

## 2023-08-28 DIAGNOSIS — G629 Polyneuropathy, unspecified: Secondary | ICD-10-CM | POA: Diagnosis present

## 2023-08-28 DIAGNOSIS — F410 Panic disorder [episodic paroxysmal anxiety] without agoraphobia: Secondary | ICD-10-CM | POA: Diagnosis present

## 2023-08-28 DIAGNOSIS — M7989 Other specified soft tissue disorders: Secondary | ICD-10-CM | POA: Diagnosis not present

## 2023-08-28 HISTORY — DX: Pneumonia, unspecified organism: J18.9

## 2023-08-28 LAB — CBC WITH DIFFERENTIAL/PLATELET
Abs Immature Granulocytes: 0.05 10*3/uL (ref 0.00–0.07)
Basophils Absolute: 0.1 10*3/uL (ref 0.0–0.1)
Basophils Relative: 1 %
Eosinophils Absolute: 0.4 10*3/uL (ref 0.0–0.5)
Eosinophils Relative: 3 %
HCT: 42.9 % (ref 39.0–52.0)
Hemoglobin: 14.6 g/dL (ref 13.0–17.0)
Immature Granulocytes: 1 %
Lymphocytes Relative: 21 %
Lymphs Abs: 2.3 10*3/uL (ref 0.7–4.0)
MCH: 31.1 pg (ref 26.0–34.0)
MCHC: 34 g/dL (ref 30.0–36.0)
MCV: 91.3 fL (ref 80.0–100.0)
Monocytes Absolute: 0.8 10*3/uL (ref 0.1–1.0)
Monocytes Relative: 7 %
Neutro Abs: 7.1 10*3/uL (ref 1.7–7.7)
Neutrophils Relative %: 67 %
Platelets: 368 10*3/uL (ref 150–400)
RBC: 4.7 MIL/uL (ref 4.22–5.81)
RDW: 14 % (ref 11.5–15.5)
WBC: 10.7 10*3/uL — ABNORMAL HIGH (ref 4.0–10.5)
nRBC: 0 % (ref 0.0–0.2)

## 2023-08-28 LAB — COMPREHENSIVE METABOLIC PANEL
ALT: 23 U/L (ref 0–44)
AST: 26 U/L (ref 15–41)
Albumin: 4.4 g/dL (ref 3.5–5.0)
Alkaline Phosphatase: 70 U/L (ref 38–126)
Anion gap: 11 (ref 5–15)
BUN: 9 mg/dL (ref 6–20)
CO2: 22 mmol/L (ref 22–32)
Calcium: 9.4 mg/dL (ref 8.9–10.3)
Chloride: 103 mmol/L (ref 98–111)
Creatinine, Ser: 0.75 mg/dL (ref 0.61–1.24)
GFR, Estimated: >60 mL/min (ref >60)
Glucose, Bld: 94 mg/dL (ref 70–99)
Potassium: 4 mmol/L (ref 3.5–5.1)
Sodium: 136 mmol/L (ref 135–145)
Total Bilirubin: 0.8 mg/dL (ref <1.2)
Total Protein: 8.3 g/dL — ABNORMAL HIGH (ref 6.5–8.1)

## 2023-08-28 LAB — PROTIME-INR
INR: 0.9 (ref 0.8–1.2)
Prothrombin Time: 12.8 s (ref 11.4–15.2)

## 2023-08-28 LAB — APTT: aPTT: 31 s (ref 24–36)

## 2023-08-28 LAB — I-STAT CG4 LACTIC ACID, ED
Lactic Acid, Venous: 3.2 mmol/L (ref 0.5–1.9)
Lactic Acid, Venous: 1.9 mmol/L (ref 0.5–1.9)

## 2023-08-28 LAB — SARS CORONAVIRUS 2 BY RT PCR: SARS Coronavirus 2 by RT PCR: NEGATIVE

## 2023-08-28 LAB — TROPONIN I (HIGH SENSITIVITY)
Troponin I (High Sensitivity): 5 ng/L (ref <18)
Troponin I (High Sensitivity): 5 ng/L (ref <18)

## 2023-08-28 LAB — D-DIMER, QUANTITATIVE: D-Dimer, Quant: 0.53 ug{FEU}/mL — ABNORMAL HIGH (ref 0.00–0.50)

## 2023-08-28 MED ORDER — LEVOTHYROXINE SODIUM 150 MCG PO TABS
150.0000 ug | ORAL_TABLET | Freq: Every day | ORAL | Status: DC
  Administered 2023-08-29 – 2023-08-30 (×2): 150 ug via ORAL
  Filled 2023-08-28 (×2): qty 1

## 2023-08-28 MED ORDER — POLYETHYLENE GLYCOL 3350 17 G PO PACK: 17.0000 g | PACK | Freq: Every day | ORAL | Status: DC | PRN

## 2023-08-28 MED ORDER — DEXTROSE 5 % IV SOLN
500.0000 mg | Freq: Once | INTRAVENOUS | Status: AC
  Administered 2023-08-28: 500 mg via INTRAVENOUS
  Filled 2023-08-28: qty 5

## 2023-08-28 MED ORDER — ACETAMINOPHEN 650 MG RE SUPP: 650.0000 mg | Freq: Four times a day (QID) | RECTAL | Status: DC | PRN

## 2023-08-28 MED ORDER — SODIUM CHLORIDE 0.9 % IV SOLN
1.0000 g | Freq: Once | INTRAVENOUS | Status: AC
  Administered 2023-08-28: 1 g via INTRAVENOUS
  Filled 2023-08-28: qty 10

## 2023-08-28 MED ORDER — IRBESARTAN 300 MG PO TABS
300.0000 mg | ORAL_TABLET | Freq: Every day | ORAL | Status: DC
  Administered 2023-08-29 – 2023-08-30 (×2): 300 mg via ORAL
  Filled 2023-08-28: qty 2
  Filled 2023-08-28 (×2): qty 1
  Filled 2023-08-28: qty 2

## 2023-08-28 MED ORDER — SODIUM CHLORIDE 0.9% FLUSH
3.0000 mL | Freq: Two times a day (BID) | INTRAVENOUS | Status: DC
  Administered 2023-08-28 – 2023-08-30 (×4): 3 mL via INTRAVENOUS

## 2023-08-28 MED ORDER — PROMETHAZINE HCL 6.25 MG/5ML PO SOLN
6.2500 mg | Freq: Four times a day (QID) | ORAL | Status: DC | PRN
  Administered 2023-08-29: 6.25 mg via ORAL
  Filled 2023-08-28 (×2): qty 5

## 2023-08-28 MED ORDER — SODIUM CHLORIDE 0.9 % IV BOLUS
1000.0000 mL | Freq: Once | INTRAVENOUS | Status: AC
  Administered 2023-08-28: 1000 mL via INTRAVENOUS

## 2023-08-28 MED ORDER — ASPIRIN 81 MG PO TBEC
81.0000 mg | DELAYED_RELEASE_TABLET | Freq: Every day | ORAL | Status: DC
  Administered 2023-08-29: 81 mg via ORAL
  Filled 2023-08-28 (×2): qty 1

## 2023-08-28 MED ORDER — GABAPENTIN 100 MG PO CAPS
100.0000 mg | ORAL_CAPSULE | Freq: Three times a day (TID) | ORAL | Status: DC
  Administered 2023-08-28 – 2023-08-30 (×5): 100 mg via ORAL
  Filled 2023-08-28 (×5): qty 1

## 2023-08-28 MED ORDER — CLONAZEPAM 0.5 MG PO TABS
0.5000 mg | ORAL_TABLET | Freq: Two times a day (BID) | ORAL | Status: DC | PRN
  Administered 2023-08-29 – 2023-08-30 (×3): 0.5 mg via ORAL
  Filled 2023-08-28 (×3): qty 1

## 2023-08-28 MED ORDER — IOHEXOL 350 MG/ML SOLN
75.0000 mL | Freq: Once | INTRAVENOUS | Status: AC | PRN
  Administered 2023-08-28: 75 mL via INTRAVENOUS

## 2023-08-28 MED ORDER — DEXTROSE 5 % IV SOLN
500.0000 mg | INTRAVENOUS | Status: DC
  Administered 2023-08-29: 500 mg via INTRAVENOUS
  Filled 2023-08-28: qty 5

## 2023-08-28 MED ORDER — OXYCODONE-ACETAMINOPHEN 5-325 MG PO TABS
1.0000 | ORAL_TABLET | Freq: Once | ORAL | Status: AC
  Administered 2023-08-28: 1 via ORAL
  Filled 2023-08-28: qty 1

## 2023-08-28 MED ORDER — VITAMIN D 25 MCG (1000 UNIT) PO TABS
1000.0000 [IU] | ORAL_TABLET | Freq: Every day | ORAL | Status: DC
  Administered 2023-08-29 – 2023-08-30 (×2): 1000 [IU] via ORAL
  Filled 2023-08-28 (×2): qty 1

## 2023-08-28 MED ORDER — ENOXAPARIN SODIUM 40 MG/0.4ML IJ SOSY
40.0000 mg | PREFILLED_SYRINGE | INTRAMUSCULAR | Status: DC
  Administered 2023-08-29: 40 mg via SUBCUTANEOUS
  Filled 2023-08-28 (×2): qty 0.4

## 2023-08-28 MED ORDER — ACETAMINOPHEN 325 MG PO TABS: 650.0000 mg | ORAL_TABLET | Freq: Four times a day (QID) | ORAL | Status: DC | PRN

## 2023-08-28 MED ORDER — OXYCODONE-ACETAMINOPHEN 5-325 MG PO TABS
1.0000 | ORAL_TABLET | Freq: Three times a day (TID) | ORAL | Status: DC | PRN
  Administered 2023-08-28 – 2023-08-30 (×4): 1 via ORAL
  Filled 2023-08-28 (×4): qty 1

## 2023-08-28 MED ORDER — ALBUTEROL SULFATE (2.5 MG/3ML) 0.083% IN NEBU
3.0000 mL | INHALATION_SOLUTION | RESPIRATORY_TRACT | Status: DC | PRN
  Administered 2023-08-28: 3 mL via RESPIRATORY_TRACT
  Filled 2023-08-28: qty 3

## 2023-08-28 MED ORDER — DEXTROMETHORPHAN POLISTIREX ER 30 MG/5ML PO SUER
15.0000 mg | Freq: Four times a day (QID) | ORAL | Status: DC | PRN
  Administered 2023-08-28 – 2023-08-29 (×3): 15 mg via ORAL
  Filled 2023-08-28 (×9): qty 5

## 2023-08-28 MED ORDER — CLONAZEPAM 0.5 MG PO TABS
0.5000 mg | ORAL_TABLET | Freq: Once | ORAL | Status: AC
  Administered 2023-08-28: 0.5 mg via ORAL
  Filled 2023-08-28: qty 1

## 2023-08-28 MED ORDER — PROMETHAZINE-DM 6.25-15 MG/5ML PO SYRP: 5.0000 mL | ORAL_SOLUTION | Freq: Four times a day (QID) | ORAL | Status: DC | PRN

## 2023-08-28 MED ORDER — SODIUM CHLORIDE 0.9 % IV SOLN
1.0000 g | INTRAVENOUS | Status: DC
  Administered 2023-08-29 – 2023-08-30 (×2): 1 g via INTRAVENOUS
  Filled 2023-08-28 (×2): qty 10

## 2023-08-28 NOTE — ED Provider Notes (Signed)
[place] EMERGENCY DEPARTMENT AT [facility] Provider Note   CSN: [id number] Arrival date & time: 08/28/23  1058     History  Chief Complaint  Patient presents with   Pneumonia    Jonathan Luna is a 57 y.o. male.  57 year old male with past medical history of hypertension and hypothyroidism presenting to the emergency department today with concern for shortness of breath.  The patient has been treated for pneumonia twice as an outpatient.  He apparently was diagnosed in October and then had a repeat x-ray due to persistent symptoms in early November and was started on a second round of antibiotics.  The patient states that his cough is improved but he continues to have shortness of breath.  He followed up with his doctor today and was found to be tachycardic.  He is subsequently sent to the ER for further evaluation.  The patient has had some chills but denies any recent fevers.  He states he does have some occasional chest discomfort in the middle of his chest mostly with coughing but occasionally with deep breathing as well.  Is here today for further evaluation regarding this.  He denies a history of DVT or pulmonary embolism, recent surgeries, recent travel.   Pneumonia Associated symptoms include shortness of breath.       Home Medications Prior to Admission medications   Medication Sig Start Date End Date Taking? Authorizing Provider  albuterol (VENTOLIN HFA) 108 (90 Base) MCG/ACT inhaler Inhale 2 puffs into the lungs every 4 (four) hours as needed. 07/24/23   [provider]  amoxicillin-clavulanate (AUGMENTIN) 875-125 MG tablet Take 1 tablet by mouth 2 (two) times daily for 10 days. 08/18/23 08/28/23  [name], MD  Cholecalciferol (VITAMIN D-3) 25 MCG (1000 UT) CAPS Take 1 capsule by mouth daily.    [provider]  clonazePAM (KLONOPIN) 0.5 MG tablet Take 1 tablet (0.5 mg total) by mouth 2 (two) times daily as needed for anxiety. 08/21/23    [name], MD  desvenlafaxine (PRISTIQ) 25 MG 24 hr tablet Take 1 tablet (25 mg total) by mouth daily. 08/17/23   Gwenlyn Fudge, FNP  Ferric Maltol (ACCRUFER) 30 MG CAPS Take 1 capsule by mouth in the morning and at bedtime. 02/27/22   [name], MD  fluticasone (FLONASE) 50 MCG/ACT nasal spray Place 2 sprays into both nostrils daily. 08/18/23   [name], MD  indapamide (LOZOL) 1.25 MG tablet Take 1 tablet (1.25 mg total) by mouth daily. Patient not taking: Reported on 08/18/2023 05/27/23   [name], MD  irbesartan (AVAPRO) 300 MG tablet Take 1 tablet (300 mg total) by mouth daily. 05/27/23   [name], MD  levothyroxine (SYNTHROID) 150 MCG tablet TAKE ONE TABLET BY MOUTH ONCE A DAY BEFORE BREAKFAST 08/03/23   [name], MD  Multiple Vitamins-Minerals (ZINC PO) Take by mouth.    [provider]  omega-3 acid ethyl esters (LOVAZA) 1 g capsule TAKE TWO CAPSULES BY MOUTH TWICE DAILY Patient not taking: Reported on 08/10/2023 01/14/23   [name], MD  oxyCODONE-acetaminophen (PERCOCET/ROXICET) 5-325 MG tablet Take 1 tablet by mouth every 8 (eight) hours as needed for severe pain (pain score 7-10). 07/30/23   [name], MD  promethazine-dextromethorphan (PROMETHAZINE-DM) 6.25-15 MG/5ML syrup Take 5 mLs by mouth 4 (four) times daily as needed for cough. 08/19/23   [name], MD      Allergies  Effexor [venlafaxine], Fluoxetine hcl, and Prozac [fluoxetine hcl]    Review of Systems   Review of Systems  Respiratory:  Positive for cough and shortness of breath.   All other systems reviewed and are negative.   Physical Exam Updated Vital Signs BP (!) 134/105   Pulse (!) 102   Temp 98.3 F (36.8 C)   Resp 20   SpO2 94%  Physical Exam Vitals and nursing note reviewed.   Gen: NAD Eyes: PERRL, EOMI HEENT: no oropharyngeal swelling Neck: trachea midline Resp: Diminished at bilateral lung bases Card: Tachycardic, no murmurs, rubs, or  gallops Abd: nontender, nondistended Extremities: no calf tenderness, no edema Vascular: 2+ radial pulses bilaterally, 2+ DP pulses bilaterally Skin: no rashes Psyc: acting appropriately   ED Results / Procedures / Treatments   Labs (all labs ordered are listed, but only abnormal results are displayed) Labs Reviewed  COMPREHENSIVE METABOLIC PANEL - Abnormal; Notable for the following components:      Result Value   Total Protein 8.3 (*)    All other components within normal limits  CBC WITH DIFFERENTIAL/PLATELET - Abnormal; Notable for the following components:   WBC 10.7 (*)    All other components within normal limits  D-DIMER, QUANTITATIVE - Abnormal; Notable for the following components:   D-Dimer, Quant 0.53 (*)    All other components within normal limits  I-STAT CG4 LACTIC ACID, ED - Abnormal; Notable for the following components:   Lactic Acid, Venous 3.2 (*)    All other components within normal limits  CULTURE, BLOOD (ROUTINE X 2)  CULTURE, BLOOD (ROUTINE X 2)  PROTIME-INR  APTT  I-STAT CG4 LACTIC ACID, ED  TROPONIN I (HIGH SENSITIVITY)  TROPONIN I (HIGH SENSITIVITY)    EKG EKG Interpretation Date/Time:  Friday August 28 2023 11:43:52 EST Ventricular Rate:  110 PR Interval:  144 QRS Duration:  90 QT Interval:  326 QTC Calculation: 441 R Axis:   61  Text Interpretation: Sinus tachycardia Confirmed by Beckey Downing [phone]) on 08/28/2023 2:28:36 PM  Radiology No results found.  Procedures Procedures    Medications Ordered in ED Medications  sodium chloride 0.9 % bolus 1,000 mL (1,000 mLs Intravenous New Bag/Given 08/28/23 1200)  cefTRIAXone (ROCEPHIN) 1 g in sodium chloride 0.9 % 100 mL IVPB (0 g Intravenous Stopped 08/28/23 1417)  azithromycin (ZITHROMAX) 500 mg in dextrose 5 % 250 mL IVPB (500 mg Intravenous New Bag/Given 08/28/23 1418)  oxyCODONE-acetaminophen (PERCOCET/ROXICET) 5-325 MG per tablet 1 tablet (1 tablet Oral Given 08/28/23 1349)   iohexol (OMNIPAQUE) 350 MG/ML injection 75 mL (75 mLs Intravenous Contrast Given 08/28/23 1401)    ED Course/ Medical Decision Making/ A&P                                 Medical Decision Making 57 year old male with past medical history of hypertension and hypothyroidism presenting to emergency department today with persistent cough and shortness of breath.  Patient is tachycardic here on arrival.  I will initiate a sepsis workup on the patient here.  Will hold off on antibiotics as at this point with the patient being on antibiotics I am unclear if he is actually septic at this time.  I will obtain a D-dimer here to evaluate for pulmonary embolism.  Given his chest pain and risk factors I will also obtain an EKG and troponin to evaluate for ACS.  I will reevaluate for ultimate disposition.  Give  patient IV fluids here for his heart rate.  The patient's lactic acid is mildly elevated.  Does have a mild leukocytosis.  D-dimer was elevated as well.  Chest x-ray does show right middle lobe infiltrate.  CT angiogram is pending at time of signout.  He will require admission for IV antibiotics given his failure of outpatient treatment.  Amount and/or Complexity of Data Reviewed Labs: ordered. Radiology: ordered. ECG/medicine tests: ordered.  Risk Prescription drug management.           Final Clinical Impression(s) / ED Diagnoses Final diagnoses:  Community acquired pneumonia of right lung, unspecified part of lung    Rx / DC Orders ED Discharge Orders     None         Durwin Glaze, MD 08/28/23 [phone]

## 2023-08-28 NOTE — ED Notes (Signed)
ED TO INPATIENT HANDOFF REPORT  Name/Age/Gender Jonathan Luna 57 y.o. male  Code Status    Code Status Orders  (From admission, onward)           Start     Ordered   08/28/23 1940  Full code  Continuous       Question:  By:  Answer:  Consent: discussion documented in EHR   08/28/23 1940           Code Status History     This patient has a current code status but no historical code status.       Home/SNF/Other Home  Chief Complaint Community acquired pneumonia [J18.9]  Level of Care/Admitting Diagnosis ED Disposition     ED Disposition  Admit   Condition  --   Comment  Hospital Area: [facility] [[id number]]  Level of Care: Med-Surg [16]  May admit patient to [facility] or [facility] if equivalent level of care is available:: No  Covid Evaluation: Symptomatic Person Under Investigation (PUI) or recent exposure (last 10 days) *Testing Required*  Diagnosis: Community acquired pneumonia [086578]  Admitting Physician: Nolberto Hanlon [[id number]]  Attending Physician: Nolberto Hanlon [[id number]]  Certification:: I certify this patient will need inpatient services for at least 2 midnights  Expected Medical Readiness: 08/30/2023          Medical History Past Medical History:  Diagnosis Date   Allergy    Anxiety    Arthritis    Cataract    Depression    Detached retina    Heart murmur    History of bilateral cataract extraction    Hyperlipidemia    Hypothyroidism    Low back pain    Panic attack    Panic attacks     Allergies Allergies  Allergen Reactions   Amoxicillin Other (See Comments)    Pt states that it does not work for him when he had back to back pneumonia   Effexor [Venlafaxine] Diarrhea   Fluoxetine Hcl    Prozac [Fluoxetine Hcl] Diarrhea    IV Location/Drains/Wounds Patient Lines/Drains/Airways Status     Active Line/Drains/Airways     Name Placement date Placement time Site Days   Peripheral IV 08/28/23 20 G 1"  Right Antecubital 08/28/23  1159  Antecubital  less than 1   Peripheral IV 08/28/23 20 G Left Antecubital 08/28/23  1200  Antecubital  less than 1            Labs/Imaging Results for orders placed or performed during the hospital encounter of 08/28/23 (from the past 48 hour(s))  Comprehensive metabolic panel     Status: Abnormal   Collection Time: 08/28/23 11:45 AM  Result Value Ref Range   Sodium 136 135 - 145 mmol/L   Potassium 4.0 3.5 - 5.1 mmol/L   Chloride 103 98 - 111 mmol/L   CO2 22 22 - 32 mmol/L   Glucose, Bld 94 70 - 99 mg/dL    Comment: Glucose reference range applies only to samples taken after fasting for at least 8 hours.   BUN 9 6 - 20 mg/dL   Creatinine, Ser 4.01 0.61 - 1.24 mg/dL   Calcium 9.4 8.9 - 02.7 mg/dL   Total Protein 8.3 (H) 6.5 - 8.1 g/dL   Albumin 4.4 3.5 - 5.0 g/dL   AST 26 15 - 41 U/L   ALT 23 0 - 44 U/L   Alkaline Phosphatase 70 38 - 126 U/L   Total Bilirubin 0.8 <  1.2 mg/dL   GFR, Estimated >16 >01 mL/min    Comment: (NOTE) Calculated using the CKD-EPI Creatinine Equation (2021)    Anion gap 11 5 - 15    Comment: Performed at [facility], [address] [place], [place] [address]  CBC with Differential     Status: Abnormal   Collection Time: 08/28/23 11:45 AM  Result Value Ref Range   WBC 10.7 (H) 4.0 - 10.5 K/uL   RBC 4.70 4.22 - 5.81 MIL/uL   Hemoglobin 14.6 13.0 - 17.0 g/dL   HCT 55.7 32.2 - 02.5 %   MCV 91.3 80.0 - 100.0 fL   MCH 31.1 26.0 - 34.0 pg   MCHC 34.0 30.0 - 36.0 g/dL   RDW 42.7 06.2 - 37.6 %   Platelets 368 150 - 400 K/uL   nRBC 0.0 0.0 - 0.2 %   Neutrophils Relative % 67 %   Neutro Abs 7.1 1.7 - 7.7 K/uL   Lymphocytes Relative 21 %   Lymphs Abs 2.3 0.7 - 4.0 K/uL   Monocytes Relative 7 %   Monocytes Absolute 0.8 0.1 - 1.0 K/uL   Eosinophils Relative 3 %   Eosinophils Absolute 0.4 0.0 - 0.5 K/uL   Basophils Relative 1 %   Basophils Absolute 0.1 0.0 - 0.1 K/uL   Immature Granulocytes 1 %   Abs  Immature Granulocytes 0.05 0.00 - 0.07 K/uL    Comment: Performed at [facility], [address], [place], [place] [address]  Protime-INR     Status: None   Collection Time: 08/28/23 11:45 AM  Result Value Ref Range   Prothrombin Time 12.8 11.4 - 15.2 seconds   INR 0.9 0.8 - 1.2    Comment: (NOTE) INR goal varies based on device and disease states. Performed at [facility], [address] [place], [place] [address]   APTT     Status: None   Collection Time: 08/28/23 11:45 AM  Result Value Ref Range   aPTT 31 24 - 36 seconds    Comment: Performed at [facility], [address] [place], [place] [address]  Troponin I (High Sensitivity)     Status: None   Collection Time: 08/28/23 11:45 AM  Result Value Ref Range   Troponin I (High Sensitivity) 5 <18 ng/L    Comment: (NOTE) Elevated high sensitivity troponin I (hsTnI) values and significant  changes across serial measurements may suggest ACS but many other  chronic and acute conditions are known to elevate hsTnI results.  Refer to the "Links" section for chest pain algorithms and additional  guidance. Performed at [facility], [address], [place], [place] [address]   D-dimer, quantitative     Status: Abnormal   Collection Time: 08/28/23 11:45 AM  Result Value Ref Range   D-Dimer, Quant 0.53 (H) 0.00 - 0.50 ug/mL-FEU    Comment: (NOTE) At the manufacturer cut-off value of 0.5 g/mL FEU, this assay has a negative predictive value of 95-100%.This assay is intended for use in conjunction with a clinical pretest probability (PTP) assessment model to exclude pulmonary embolism (PE) and deep venous thrombosis (DVT) in outpatients suspected of PE or DVT. Results should be correlated with clinical presentation. Performed at [facility], [address] [place], [place] [address]   I-Stat Lactic Acid, ED     Status: Abnormal    Collection Time: 08/28/23 11:52 AM  Result Value Ref Range   Lactic Acid,  Venous 3.2 (HH) 0.5 - 1.9 mmol/L  Troponin I (High Sensitivity)     Status: None   Collection Time: 08/28/23  1:42 PM  Result Value Ref Range   Troponin I (High Sensitivity) 5 <18 ng/L    Comment: (NOTE) Elevated high sensitivity troponin I (hsTnI) values and significant  changes across serial measurements may suggest ACS but many other  chronic and acute conditions are known to elevate hsTnI results.  Refer to the "Links" section for chest pain algorithms and additional  guidance. Performed at [facility], [address] [place], [place] [address]   I-Stat Lactic Acid, ED     Status: None   Collection Time: 08/28/23  1:59 PM  Result Value Ref Range   Lactic Acid, Venous 1.9 0.5 - 1.9 mmol/L   CT Angio Chest PE W and/or Wo Contrast  Result Date: 08/28/2023 CLINICAL DATA:  Pneumonia for a month.  On antibiotics. EXAM: CT ANGIOGRAPHY CHEST WITH CONTRAST TECHNIQUE: Multidetector CT imaging of the chest was performed using the standard protocol during bolus administration of intravenous contrast. Multiplanar CT image reconstructions and MIPs were obtained to evaluate the vascular anatomy. RADIATION DOSE REDUCTION: This exam was performed according to the departmental dose-optimization program which includes automated exposure control, adjustment of the mA and/or kV according to patient size and/or use of iterative reconstruction technique. CONTRAST:  75mL OMNIPAQUE IOHEXOL 350 MG/ML SOLN COMPARISON:  Plain film of earlier today chest CT 05/06/2022 FINDINGS: Cardiovascular: The quality of this exam for evaluation of pulmonary embolism is moderate. The bolus is centered in the SVC. No pulmonary embolism to the large segmental level. Normal aortic caliber. Normal heart size, without pericardial effusion. Mild LAD coronary artery calcification. Mediastinum/Nodes: No mediastinal or hilar adenopathy.  Lungs/Pleura: No pleural fluid. Patent airways, including to the right upper lobe. Posterior right upper lobe airspace and less so ground-glass opacity is new since the prior CT. Upper Abdomen: Normal imaged portions of the liver, spleen, stomach, pancreas, gallbladder, adrenal glands, kidneys. Musculoskeletal: Superior endplate irregularity and sclerosis at T12 is most likely degenerative versus less likely related to remote compression fracture. Review of the MIP images confirms the above findings. Mild S shaped thoracolumbar spine curvature. IMPRESSION: 1. No pulmonary embolism with mild limitations as detailed above. 2. Posterior right upper lobe airspace and ground-glass opacity consistent with pneumonia. No centrally obstructive mass identified. 3. Mild coronary artery calcification. Electronically Signed   By: Jeronimo Greaves M.D.   On: 08/28/2023 17:20   DG Chest Port 1 View  Result Date: 08/28/2023 CLINICAL DATA:  Recent history of pneumonia. EXAM: PORTABLE CHEST 1 VIEW COMPARISON:  Chest radiograph dated August 18, 2023. FINDINGS: The heart size and mediastinal contours are within normal limits. Persistent slightly decreased confluent airspace opacity in the right upper lobe. No new focal consolidation. No pleural effusion or pneumothorax. No acute osseous abnormality. IMPRESSION: Persistent slightly decreased confluent airspace opacity in the right upper lobe is concerning for pneumonia. Followup PA and lateral chest X-ray is recommended in 6-8 weeks to ensure resolution and exclude underlying malignancy Electronically Signed   By: Hart Robinsons M.D.   On: 08/28/2023 16:08    Pending Labs Unresulted Labs (From admission, onward)     Start     Ordered   09/04/23 0500  Creatinine, serum  (enoxaparin (LOVENOX)    CrCl >/= 30 ml/min)  Weekly,   R     Comments: while on enoxaparin therapy    08/28/23 1940   08/29/23  0500  Vitamin B12  Tomorrow morning,   R        08/28/23 1933   08/29/23 0500   TSH  Tomorrow morning,   R        08/28/23 1933   08/29/23 0500  APTT  Tomorrow morning,   R        08/28/23 1940   08/29/23 0500  Protime-INR  Tomorrow morning,   R        08/28/23 1940   08/29/23 0500  Basic metabolic panel  Tomorrow morning,   R        08/28/23 1940   08/29/23 0500  CBC  Tomorrow morning,   R        08/28/23 1940   08/28/23 1939  HIV Antibody (routine testing w rflx)  (HIV Antibody (Routine testing w reflex) panel)  Once,   R        08/28/23 1940   08/28/23 1936  SARS Coronavirus 2 by RT PCR (hospital order, performed in [facility] hospital lab) *cepheid single result test* Anterior Nasal Swab  (Tier 2 - SARS Coronavirus 2 by RT PCR (hospital order, performed in [facility] hospital lab) *cepheid single result test*)  Once,   URGENT        08/28/23 1935   08/28/23 1936  Respiratory (~20 pathogens) panel by PCR  (Respiratory panel by PCR (~20 pathogens, ~24 hr TAT)  w precautions)  Once,   URGENT        08/28/23 1935   08/28/23 1611  Legionella Pneumophila Serogp 1 Ur Ag  Once,   URGENT        08/28/23 1610   08/28/23 1123  Blood Culture (routine x 2)  (Undifferentiated presentation (screening labs and basic nursing orders))  BLOOD CULTURE X 2,   STAT      08/28/23 1122            Vitals/Pain Today's Vitals   08/28/23 1836 08/28/23 1900 08/28/23 1930 08/28/23 1933  BP: 138/87 (!) 131/94    Pulse: (!) 105 (!) 107    Resp: 14 19    Temp:    99 F (37.2 C)  TempSrc:    Oral  SpO2: 95% 91%    PainSc:   10-Worst pain ever     Isolation Precautions Droplet precaution  Medications Medications  gabapentin (NEURONTIN) capsule 100 mg (has no administration in time range)  cefTRIAXone (ROCEPHIN) 1 g in sodium chloride 0.9 % 100 mL IVPB (has no administration in time range)  azithromycin (ZITHROMAX) 500 mg in dextrose 5 % 250 mL IVPB (has no administration in time range)  oxyCODONE-acetaminophen (PERCOCET/ROXICET) 5-325 MG per tablet 1 tablet (1 tablet Oral  Given 08/28/23 2001)  enoxaparin (LOVENOX) injection 40 mg (has no administration in time range)  acetaminophen (TYLENOL) tablet 650 mg (has no administration in time range)    Or  acetaminophen (TYLENOL) suppository 650 mg (has no administration in time range)  polyethylene glycol (MIRALAX / GLYCOLAX) packet 17 g (has no administration in time range)  sodium chloride flush (NS) 0.9 % injection 3 mL (has no administration in time range)  sodium chloride 0.9 % bolus 1,000 mL (0 mLs Intravenous Stopped 08/28/23 1816)  cefTRIAXone (ROCEPHIN) 1 g in sodium chloride 0.9 % 100 mL IVPB (0 g Intravenous Stopped 08/28/23 1417)  azithromycin (ZITHROMAX) 500 mg in dextrose 5 % 250 mL IVPB (0 mg Intravenous Stopped 08/28/23 1523)  oxyCODONE-acetaminophen (PERCOCET/ROXICET) 5-325 MG per tablet 1 tablet (1  tablet Oral Given 08/28/23 1349)  iohexol (OMNIPAQUE) 350 MG/ML injection 75 mL (75 mLs Intravenous Contrast Given 08/28/23 1401)  clonazePAM (KLONOPIN) tablet 0.5 mg (0.5 mg Oral Given 08/28/23 1806)    Mobility walks

## 2023-08-28 NOTE — ED Triage Notes (Signed)
Pt arrived via [facility] from [facility]. He has been fighting pneumonia for about a month now and he is on his second dose of amoxycillin. He was first diagnosed with pneumonia 07/24/23.  T 101 BP 168/110 HR 116  RR 28

## 2023-08-28 NOTE — ED Provider Notes (Signed)
Received patient in turnover from Dr. Rhae Hammock.  Please see their note for further details of Hx, PE.  Briefly patient is a 57 y.o. male with a Pneumonia .  Patient found to have persistent pneumonia despite 2 different trials of outpatient antibiotics.  Plan for admission.  Given Rocephin and azithromycin..  CT scan without PE.  Patient does have right-sided pneumonia.  Will discuss with medicine.   Melene Plan, DO 08/28/23 1743

## 2023-08-28 NOTE — H&P (Addendum)
History and Physical    Patient: Jonathan Luna [id number] DOB: 01/24/1966 DOA: 08/28/2023 DOS: the patient was seen and examined on 08/28/2023 PCP: [name], MD  Patient coming from: Home> PCP office  Chief Complaint:  Chief Complaint  Patient presents with   Pneumonia   HPI: Jonathan Luna is a 57 y.o. male with medical history significant of panic attacks/anxiety.  Patient reports new onset of cough/fatigue and subjective fevers approximately a month ago, patient was diagnosed with pneumonia at the time and completed course of azithromycin.  Subsequently patient had persistent symptoms and completed a course of Augmentin earlier this month.  Unfortunately patient is having persistent cough with very scant sputum, marked fatigue.  No chest pain.  No reported shortness of breath.  However patient returned to PCP office today and report recalls having a temperature of 101 F.  However on documentation review I do not see fever.  However patient's heart rate was 127.  Appeared to be ill-appearing to the PCP as well as diaphoretic.  Patient was therefore transferred to [facility] long ER.  There is no report of leg swelling or cramping.  Separately patient describes syndrome of bilateral feet burning for at least a couple of months after having a shower and when drying.  And for the last 3 to 4 weeks he is having numbness and tingling sensation of bilateral fingers as well. Review of Systems: As mentioned in the history of present illness. All other systems reviewed and are negative. Past Medical History:  Diagnosis Date   Allergy    Anxiety    Arthritis    Cataract    Depression    Detached retina    Heart murmur    History of bilateral cataract extraction    Hyperlipidemia    Hypothyroidism    Low back pain    Panic attack    Panic attacks    Past Surgical History:  Procedure Laterality Date   CATARACT EXTRACTION     EYE SURGERY     Social History:  reports that he has never  smoked. He has been exposed to tobacco smoke. He has never used smokeless tobacco. He reports that he does not currently use alcohol. He reports that he does not use drugs.  Allergies  Allergen Reactions   Effexor [Venlafaxine] Diarrhea   Fluoxetine Hcl    Prozac [Fluoxetine Hcl] Diarrhea    Family History  Problem Relation Age of Onset   Breast cancer Mother    Hypertension Father    Heart disease Father    Asthma Father    Emphysema Father        smoker for 45 years   Diabetes Brother    Hyperlipidemia Brother     Prior to Admission medications   Medication Sig Start Date End Date Taking? Authorizing Provider  albuterol (VENTOLIN HFA) 108 (90 Base) MCG/ACT inhaler Inhale 2 puffs into the lungs every 4 (four) hours as needed. 07/24/23   [provider]  amoxicillin-clavulanate (AUGMENTIN) 875-125 MG tablet Take 1 tablet by mouth 2 (two) times daily for 10 days. 08/18/23 08/28/23  [name], MD  Cholecalciferol (VITAMIN D-3) 25 MCG (1000 UT) CAPS Take 1 capsule by mouth daily.    [provider]  clonazePAM (KLONOPIN) 0.5 MG tablet Take 1 tablet (0.5 mg total) by mouth 2 (two) times daily as needed for anxiety. 08/21/23   [name], MD  desvenlafaxine (PRISTIQ) 25 MG 24 hr tablet Take 1 tablet (  25 mg total) by mouth daily. 08/17/23   Gwenlyn Fudge, FNP  Ferric Maltol (ACCRUFER) 30 MG CAPS Take 1 capsule by mouth in the morning and at bedtime. 02/27/22   [name], MD  fluticasone (FLONASE) 50 MCG/ACT nasal spray Place 2 sprays into both nostrils daily. 08/18/23   [name], MD  indapamide (LOZOL) 1.25 MG tablet Take 1 tablet (1.25 mg total) by mouth daily. Patient not taking: Reported on 08/18/2023 05/27/23   [name], MD  irbesartan (AVAPRO) 300 MG tablet Take 1 tablet (300 mg total) by mouth daily. 05/27/23   [name], MD  levothyroxine (SYNTHROID) 150 MCG tablet TAKE ONE TABLET BY MOUTH ONCE A DAY BEFORE BREAKFAST 08/03/23    [name], MD  Multiple Vitamins-Minerals (ZINC PO) Take by mouth.    [provider]  omega-3 acid ethyl esters (LOVAZA) 1 g capsule TAKE TWO CAPSULES BY MOUTH TWICE DAILY Patient not taking: Reported on 08/10/2023 01/14/23   [name], MD  oxyCODONE-acetaminophen (PERCOCET/ROXICET) 5-325 MG tablet Take 1 tablet by mouth every 8 (eight) hours as needed for severe pain (pain score 7-10). 07/30/23   [name], MD  promethazine-dextromethorphan (PROMETHAZINE-DM) 6.25-15 MG/5ML syrup Take 5 mLs by mouth 4 (four) times daily as needed for cough. 08/19/23   [name], MD    Physical Exam: Vitals:   08/28/23 1415 08/28/23 1508 08/28/23 1529 08/28/23 1836  BP: (!) 157/99 (!) 134/105 (!) 149/108 138/87  Pulse: (!) 107 (!) 102 95 (!) 105  Resp: 13 20 13 14   Temp:  98.3 F (36.8 C)    TempSrc:      SpO2: 96% 94% 96% 95%   Normal: Patient is alert and awake does not appear to be distressed Respiratory exam: Bilateral intravesicular Cardiovascular exam S1-S2 normal Abdomen all quadrants soft nontender Extremities warm without edema No Focal motor deficit Data Reviewed:  Labs on Admission:  Results for orders placed or performed during the hospital encounter of 08/28/23 (from the past 24 hour(s))  Comprehensive metabolic panel     Status: Abnormal   Collection Time: 08/28/23 11:45 AM  Result Value Ref Range   Sodium 136 135 - 145 mmol/L   Potassium 4.0 3.5 - 5.1 mmol/L   Chloride 103 98 - 111 mmol/L   CO2 22 22 - 32 mmol/L   Glucose, Bld 94 70 - 99 mg/dL   BUN 9 6 - 20 mg/dL   Creatinine, Ser 6.01 0.61 - 1.24 mg/dL   Calcium 9.4 8.9 - 09.3 mg/dL   Total Protein 8.3 (H) 6.5 - 8.1 g/dL   Albumin 4.4 3.5 - 5.0 g/dL   AST 26 15 - 41 U/L   ALT 23 0 - 44 U/L   Alkaline Phosphatase 70 38 - 126 U/L   Total Bilirubin 0.8 <1.2 mg/dL   GFR, Estimated >23 >55 mL/min   Anion gap 11 5 - 15  CBC with Differential     Status: Abnormal   Collection Time: 08/28/23  11:45 AM  Result Value Ref Range   WBC 10.7 (H) 4.0 - 10.5 K/uL   RBC 4.70 4.22 - 5.81 MIL/uL   Hemoglobin 14.6 13.0 - 17.0 g/dL   HCT 73.2 20.2 - 54.2 %   MCV 91.3 80.0 - 100.0 fL   MCH 31.1 26.0 - 34.0 pg   MCHC 34.0 30.0 - 36.0 g/dL   RDW 70.6 23.7 - 62.8 %   Platelets 368 150 - 400  K/uL   nRBC 0.0 0.0 - 0.2 %   Neutrophils Relative % 67 %   Neutro Abs 7.1 1.7 - 7.7 K/uL   Lymphocytes Relative 21 %   Lymphs Abs 2.3 0.7 - 4.0 K/uL   Monocytes Relative 7 %   Monocytes Absolute 0.8 0.1 - 1.0 K/uL   Eosinophils Relative 3 %   Eosinophils Absolute 0.4 0.0 - 0.5 K/uL   Basophils Relative 1 %   Basophils Absolute 0.1 0.0 - 0.1 K/uL   Immature Granulocytes 1 %   Abs Immature Granulocytes 0.05 0.00 - 0.07 K/uL  Protime-INR     Status: None   Collection Time: 08/28/23 11:45 AM  Result Value Ref Range   Prothrombin Time 12.8 11.4 - 15.2 seconds   INR 0.9 0.8 - 1.2  APTT     Status: None   Collection Time: 08/28/23 11:45 AM  Result Value Ref Range   aPTT 31 24 - 36 seconds  Troponin I (High Sensitivity)     Status: None   Collection Time: 08/28/23 11:45 AM  Result Value Ref Range   Troponin I (High Sensitivity) 5 <18 ng/L  D-dimer, quantitative     Status: Abnormal   Collection Time: 08/28/23 11:45 AM  Result Value Ref Range   D-Dimer, Quant 0.53 (H) 0.00 - 0.50 ug/mL-FEU  I-Stat Lactic Acid, ED     Status: Abnormal   Collection Time: 08/28/23 11:52 AM  Result Value Ref Range   Lactic Acid, Venous 3.2 (HH) 0.5 - 1.9 mmol/L  Troponin I (High Sensitivity)     Status: None   Collection Time: 08/28/23  1:42 PM  Result Value Ref Range   Troponin I (High Sensitivity) 5 <18 ng/L  I-Stat Lactic Acid, ED     Status: None   Collection Time: 08/28/23  1:59 PM  Result Value Ref Range   Lactic Acid, Venous 1.9 0.5 - 1.9 mmol/L   Basic Metabolic Panel: Recent Labs  Lab 08/28/23 1145  NA 136  K 4.0  CL 103  CO2 22  GLUCOSE 94  BUN 9  CREATININE 0.75  CALCIUM 9.4   Liver  Function Tests: Recent Labs  Lab 08/28/23 1145  AST 26  ALT 23  ALKPHOS 70  BILITOT 0.8  PROT 8.3*  ALBUMIN 4.4   No results for input(s): "LIPASE", "AMYLASE" in the last 168 hours. No results for input(s): "AMMONIA" in the last 168 hours. CBC: Recent Labs  Lab 08/28/23 1145  WBC 10.7*  NEUTROABS 7.1  HGB 14.6  HCT 42.9  MCV 91.3  PLT 368   Cardiac Enzymes: Recent Labs  Lab 08/28/23 1145 08/28/23 1342  TROPONINIHS 5 5    BNP (last 3 results) No results for input(s): "PROBNP" in the last 8760 hours. CBG: No results for input(s): "GLUCAP" in the last 168 hours.  Radiological Exams on Admission:  CT Angio Chest PE W and/or Wo Contrast  Result Date: 08/28/2023 CLINICAL DATA:  Pneumonia for a month.  On antibiotics. EXAM: CT ANGIOGRAPHY CHEST WITH CONTRAST TECHNIQUE: Multidetector CT imaging of the chest was performed using the standard protocol during bolus administration of intravenous contrast. Multiplanar CT image reconstructions and MIPs were obtained to evaluate the vascular anatomy. RADIATION DOSE REDUCTION: This exam was performed according to the departmental dose-optimization program which includes automated exposure control, adjustment of the mA and/or kV according to patient size and/or use of iterative reconstruction technique. CONTRAST:  75mL OMNIPAQUE IOHEXOL 350 MG/ML SOLN COMPARISON:  Plain film of earlier  today chest CT 05/06/2022 FINDINGS: Cardiovascular: The quality of this exam for evaluation of pulmonary embolism is moderate. The bolus is centered in the SVC. No pulmonary embolism to the large segmental level. Normal aortic caliber. Normal heart size, without pericardial effusion. Mild LAD coronary artery calcification. Mediastinum/Nodes: No mediastinal or hilar adenopathy. Lungs/Pleura: No pleural fluid. Patent airways, including to the right upper lobe. Posterior right upper lobe airspace and less so ground-glass opacity is new since the prior CT. Upper  Abdomen: Normal imaged portions of the liver, spleen, stomach, pancreas, gallbladder, adrenal glands, kidneys. Musculoskeletal: Superior endplate irregularity and sclerosis at T12 is most likely degenerative versus less likely related to remote compression fracture. Review of the MIP images confirms the above findings. Mild S shaped thoracolumbar spine curvature. IMPRESSION: 1. No pulmonary embolism with mild limitations as detailed above. 2. Posterior right upper lobe airspace and ground-glass opacity consistent with pneumonia. No centrally obstructive mass identified. 3. Mild coronary artery calcification. Electronically Signed   By: Jeronimo Greaves M.D.   On: 08/28/2023 17:20   DG Chest Port 1 View  Result Date: 08/28/2023 CLINICAL DATA:  Recent history of pneumonia. EXAM: PORTABLE CHEST 1 VIEW COMPARISON:  Chest radiograph dated August 18, 2023. FINDINGS: The heart size and mediastinal contours are within normal limits. Persistent slightly decreased confluent airspace opacity in the right upper lobe. No new focal consolidation. No pleural effusion or pneumothorax. No acute osseous abnormality. IMPRESSION: Persistent slightly decreased confluent airspace opacity in the right upper lobe is concerning for pneumonia. Followup PA and lateral chest X-ray is recommended in 6-8 weeks to ensure resolution and exclude underlying malignancy Electronically Signed   By: Hart Robinsons M.D.   On: 08/28/2023 16:08    EKG: Independently reviewed. Sinus tachycardia.   Assessment and Plan: * Pneumonia of right upper lobe due to infectious organism See HPI. This seems to have been diagnosed in mid-October, patient has completed a course of both azithromycin and subsequently Augmentin.  Unfortunately persistent cough, fatigue, tachycardia and noted to have mild leukocytosis here. Today:  Patient also had some lactic acidosis in the ER that has resolved.  D-dimer was positive CAT scan PE protocol negative for pulmonary  embolism, however showing posterior right upper lobe airspace and groundglass opacity consistent with pneumonia.  I will check respiratory viral panel.  Patient is appearing nontoxic at this time.  Started on ceftriaxone and azithromycin.  Blood cultures drawn in the ER already.  Continue with same.  Pending Legionella antigen testing. S.p 1 liter NS in ER. C.w. regular diet for patient.  Peripheral neuropathy HPI, this seems to be new diagnosis.  I will check B12 TSH, give empiric multivitamin.  Gabapentin.      Advance Care Planning:   Code Status: Not on file full code.  Consults: none at this time.   Family Communication: per patietn. Wife at bedside. All questions ordered.  Severity of Illness: The appropriate patient status for this patient is INPATIENT. Inpatient status is judged to be reasonable and necessary in order to provide the required intensity of service to ensure the patient's safety. The patient's presenting symptoms, physical exam findings, and initial radiographic and laboratory data in the context of their chronic comorbidities is felt to place them at high risk for further clinical deterioration. Furthermore, it is not anticipated that the patient will be medically stable for discharge from the hospital within 2 midnights of admission.   * I certify that at the point of admission it is my clinical  judgment that the patient will require inpatient hospital care spanning beyond 2 midnights from the point of admission due to high intensity of service, high risk for further deterioration and high frequency of surveillance required.*  Author: Nolberto Hanlon, MD 08/28/2023 7:29 PM  For on call review [email or web address].

## 2023-08-28 NOTE — Assessment & Plan Note (Signed)
HPI, this seems to be new diagnosis.  I will check B12 TSH, give empiric multivitamin.  Gabapentin.

## 2023-08-28 NOTE — Telephone Encounter (Signed)
Prescription Request  08/28/2023  LOV: 08/28/2023  What is the name of the medication or equipment? oxyCODONE-acetaminophen (PERCOCET/ROXICET) 5-325 MG tablet   Have you contacted your pharmacy to request a refill? No   Which pharmacy would you like this sent to?    [id number] - [place], [place] - [address] [place] [address] Phone: [phone] Fax: [phone]  Patient notified that their request is being sent to the clinical staff for review and that they should receive a response within 2 business days.   Please advise at Mobile [phone] (mobile)

## 2023-08-28 NOTE — ED Notes (Signed)
Room still being cleaned.

## 2023-08-28 NOTE — Assessment & Plan Note (Addendum)
See HPI. This seems to have been diagnosed in mid-October, patient has completed a course of both azithromycin and subsequently Augmentin.  Unfortunately persistent cough, fatigue, tachycardia and noted to have mild leukocytosis here. Today:  Patient also had some lactic acidosis in the ER that has resolved.  D-dimer was positive CAT scan PE protocol negative for pulmonary embolism, however showing posterior right upper lobe airspace and groundglass opacity consistent with pneumonia.  I will check respiratory viral panel.  Patient is appearing nontoxic at this time.  Started on ceftriaxone and azithromycin.  Blood cultures drawn in the ER already.  Continue with same.  Pending Legionella antigen testing. S.p 1 liter NS in ER. C.w. regular diet for patient.
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
## 2023-08-29 ENCOUNTER — Inpatient Hospital Stay (HOSPITAL_COMMUNITY): Payer: 59

## 2023-08-29 DIAGNOSIS — J189 Pneumonia, unspecified organism: Secondary | ICD-10-CM

## 2023-08-29 DIAGNOSIS — M7989 Other specified soft tissue disorders: Secondary | ICD-10-CM

## 2023-08-29 DIAGNOSIS — G629 Polyneuropathy, unspecified: Secondary | ICD-10-CM

## 2023-08-29 LAB — RESPIRATORY PANEL BY PCR

## 2023-08-29 LAB — CBC
HCT: 40.5 % (ref 39.0–52.0)
Hemoglobin: 13.8 g/dL (ref 13.0–17.0)
MCH: 31.4 pg (ref 26.0–34.0)
MCHC: 34.1 g/dL (ref 30.0–36.0)
MCV: 92.3 fL (ref 80.0–100.0)
Platelets: 325 10*3/uL (ref 150–400)
RBC: 4.39 MIL/uL (ref 4.22–5.81)
RDW: 14.3 % (ref 11.5–15.5)
WBC: 9.3 10*3/uL (ref 4.0–10.5)
nRBC: 0 % (ref 0.0–0.2)

## 2023-08-29 LAB — BASIC METABOLIC PANEL WITH GFR
Anion gap: 10 (ref 5–15)
BUN: 9 mg/dL (ref 6–20)
CO2: 21 mmol/L — ABNORMAL LOW (ref 22–32)
Calcium: 8.9 mg/dL (ref 8.9–10.3)
Chloride: 100 mmol/L (ref 98–111)
Creatinine, Ser: 0.71 mg/dL (ref 0.61–1.24)
GFR, Estimated: >60 mL/min
Glucose, Bld: 99 mg/dL (ref 70–99)
Potassium: 3.4 mmol/L — ABNORMAL LOW (ref 3.5–5.1)
Sodium: 131 mmol/L — ABNORMAL LOW (ref 135–145)

## 2023-08-29 LAB — VITAMIN B12: Vitamin B-12: 306 pg/mL (ref 180–914)

## 2023-08-29 LAB — APTT: aPTT: 32 s (ref 24–36)

## 2023-08-29 LAB — HIV ANTIBODY (ROUTINE TESTING W REFLEX): HIV Screen 4th Generation wRfx: NONREACTIVE

## 2023-08-29 LAB — PROTIME-INR
INR: 1 (ref 0.8–1.2)
Prothrombin Time: 13.2 s (ref 11.4–15.2)

## 2023-08-29 LAB — TSH: TSH: 0.826 u[IU]/mL (ref 0.350–4.500)

## 2023-08-29 MED ORDER — GUAIFENESIN-DM 100-10 MG/5ML PO SYRP: 5.0000 mL | ORAL_SOLUTION | ORAL | Status: DC | PRN

## 2023-08-29 MED ORDER — HYDROCOD POLI-CHLORPHE POLI ER 10-8 MG/5ML PO SUER
5.0000 mL | Freq: Two times a day (BID) | ORAL | Status: DC | PRN
  Administered 2023-08-29 – 2023-08-30 (×2): 5 mL via ORAL
  Filled 2023-08-29 (×2): qty 5

## 2023-08-29 MED ORDER — ALBUTEROL SULFATE (2.5 MG/3ML) 0.083% IN NEBU
2.5000 mg | INHALATION_SOLUTION | Freq: Three times a day (TID) | RESPIRATORY_TRACT | Status: DC
  Administered 2023-08-29 (×3): 2.5 mg via RESPIRATORY_TRACT
  Filled 2023-08-29 (×4): qty 3

## 2023-08-29 MED ORDER — MENTHOL 3 MG MT LOZG: 1.0000 | LOZENGE | OROMUCOSAL | Status: DC | PRN

## 2023-08-29 MED ORDER — POTASSIUM CHLORIDE CRYS ER 20 MEQ PO TBCR
40.0000 meq | EXTENDED_RELEASE_TABLET | Freq: Two times a day (BID) | ORAL | Status: AC
  Administered 2023-08-29 – 2023-08-30 (×2): 40 meq via ORAL
  Filled 2023-08-29 (×2): qty 2

## 2023-08-29 MED ORDER — GUAIFENESIN ER 600 MG PO TB12
1200.0000 mg | ORAL_TABLET | Freq: Two times a day (BID) | ORAL | Status: DC
  Administered 2023-08-29 – 2023-08-30 (×3): 1200 mg via ORAL
  Filled 2023-08-29 (×3): qty 2

## 2023-08-29 MED ORDER — AZITHROMYCIN 250 MG PO TABS
500.0000 mg | ORAL_TABLET | Freq: Every day | ORAL | Status: AC
  Administered 2023-08-30: 500 mg via ORAL
  Filled 2023-08-29: qty 2

## 2023-08-29 NOTE — Progress Notes (Signed)
PROGRESS NOTE    Jonathan Luna  RUE:[id number] DOB: 20-Mar-1966 DOA: 08/28/2023 PCP: [name], MD   Brief Narrative:  The patient is a 57 year old Caucasian male with a past medical history significant for but not limited to panic attacks and anxiety as well as other comorbidities who presents with new onset cough and fatigue and subjective fevers approximate month ago.  At that time he was diagnosed with pneumonia and completed course of azithromycin.  Subsequently patient had persistent symptoms and completed course of Augmentin earlier this month unfortunately continues to have this persistent cough with very scant sputum and marked fatigue.  He had no reported shortness of breath however he returned to his PCP office yesterday and reports calling a fever 101.  He appeared to be ill-appearing at his PCP as well as diaphoretic with heart rates in the 127 so he was transferred to [facility] for further evaluation.  Of note he is also been having 3 to 4 weeks of leg numbness and tingling in bilateral fingers as well is being worked up for peripheral neuropathy and will get a hemoglobin A1c in a.m.   Given his persistent pneumonia pulmonary was consulted and recommending continuing CAP coverage for now.  Assessment and Plan:  Sepsis in the setting of Pneumonia of right upper lobe due to infectious organism -This seems to have been diagnosed in mid-October, patient has completed a course of both azithromycin and subsequently Augmentin.   -Unfortunately he has had persistent cough, fatigue, tachycardia, tachypnea and noted to have mild leukocytosis here.  -Patient also had some lactic acidosis in the ER that has resolved.  - D-dimer was positive CAT scan PE protocol negative for pulmonary embolism, however showing posterior right upper lobe airspace and groundglass opacity consistent with pneumonia.   -Respiratory Virus panel negative  Patient is appearing nontoxic at this time but has  significant cough -Started on IV Ceftriaxone and Azithromycin which will be continued.   -Blood cultures drawn in the ER already.   -Pending Legionella antigen testing, Strep Pnuemo Urinary Ag and Sputum Cx -S.p 1 liter NS in ER.  -Given his persistence consulted Pulmonary and they are recommending continue Treatment for now and if he feels well tomorrow they are recommending trying Ceftin and Doxy for 7 more days but if reoccurs regular consider bronchoscopy to rule out COP -Repeat CXR done this AM and showed "Subtle residual linear airspace consolidation in the right upper thorax. No pneumothorax. Left lung is clear. Osseous structures are without acute abnormality. Soft tissues are grossly normal."  Peripheral Neuropathy -Check B12 TSH, give empiric multivitamin.  -C/w Gabapentin 100 mg po TID -Check hemoglobin A1c in a.m.  Elevated D-Dimer -Check LE Venous Duplex and showed "No evidence of deep vein thrombosis seen in the lower extremities, bilaterally. No evidence of popliteal cyst, bilaterally." -CTA showed no Pulmonary Emoblus   Essential Hypertension -C/w Irbesartan 300 mg po Daily -Continue to Monitor BP per Protocol -Last BP reading was 141/98  Hypothyroidism -Checked TSH and was 0.826 -C/w Levothyroxine 150 mcg po Daily   Hyponatremia -Na+ Trend: Recent Labs  Lab 08/10/23 1022 08/18/23 1114 08/28/23 1145 08/29/23 0523  NA 134* 135 136 131*  -Continue to Monitor and Trend and repeat CMP in the AM  Hypokalemia -Patient's K+ Level Trend: Recent Labs  Lab 08/10/23 1022 08/18/23 1114 08/28/23 1145 08/29/23 0523  K 3.9 3.8 4.0 3.4*  -Replete with po KCL 40 mEQ BID -Continue to Monitor and Replete as Necessary -Repeat  CMP in the AM   Obesity -Complicates overall prognosis and care -Estimated body mass index is 30.1 kg/m as calculated from the following:   Height as of an earlier encounter on 08/28/23: 5\' 7"  (1.702 m).   Weight as of an earlier encounter on  08/28/23: 87.2 kg.  -Weight Loss and Dietary Counseling given  DVT prophylaxis: enoxaparin (LOVENOX) injection 40 mg Start: 08/29/23 0800    Code Status: Full Code Family Communication: No family present at bedside  Disposition Plan:  Level of care: Med-Surg Status is: Inpatient Remains inpatient appropriate because: Needs further clinical improvement and clearance by specialists    Consultants:  Pulmonary  Procedures:  As delineated as above  Antimicrobials:  Anti-infectives (From admission, onward)    Start     Dose/Rate Route Frequency Ordered Stop   08/30/23 1000  azithromycin (ZITHROMAX) tablet 500 mg        500 mg Oral Daily 08/29/23 1032 08/31/23 0959   08/29/23 1000  cefTRIAXone (ROCEPHIN) 1 g in sodium chloride 0.9 % 100 mL IVPB        1 g 200 mL/hr over 30 Minutes Intravenous Every 24 hours 08/28/23 1937 09/03/23 0959   08/29/23 1000  azithromycin (ZITHROMAX) 500 mg in dextrose 5 % 250 mL IVPB  Status:  Discontinued        500 mg 250 mL/hr over 60 Minutes Intravenous Every 24 hours 08/28/23 1937 08/29/23 1031   08/28/23 1330  cefTRIAXone (ROCEPHIN) 1 g in sodium chloride 0.9 % 100 mL IVPB        1 g 200 mL/hr over 30 Minutes Intravenous  Once 08/28/23 1329 08/28/23 1417   08/28/23 1330  azithromycin (ZITHROMAX) 500 mg in dextrose 5 % 250 mL IVPB        500 mg 250 mL/hr over 60 Minutes Intravenous  Once 08/28/23 1329 08/28/23 1523       Subjective: Seen and examined at bedside and he continues to have a significant cough and states that he is not bringing up very much sputum.  States that the cough tested him and bother him all night that he did not sleep.  No nausea or vomiting.  Worried about his pneumonia.  No other concerns or complaints at this time.  Objective: Vitals:   08/29/23 0815 08/29/23 0930 08/29/23 1239 08/29/23 1537  BP:  132/88 (!) 141/98   Pulse:  (!) 106    Resp:   18   Temp:  98.7 F (37.1 C) 98.6 F (37 C)   TempSrc:  Oral Oral    SpO2: 94% 92% 97% 95%    Intake/Output Summary (Last 24 hours) at 08/29/2023 1851 Last data filed at 08/29/2023 1700 Gross per 24 hour  Intake 221.11 ml  Output 1800 ml  Net -1578.89 ml   There were no vitals filed for this visit.  Examination: Physical Exam:  Constitutional: WN/WD obese Caucasian male who is very anxious Respiratory: Diminished to auscultation bilaterally with some coarse breath sounds and has some rhonchi on the right compared to left no appreciable wheezing or rales.  No appreciable crackles and has a normal respiratory effort and is not wearing any supplemental oxygen nasal cannula Cardiovascular: RRR, no murmurs / rubs / gallops. S1 and S2 auscultated. No extremity edema.  Abdomen: Soft, non-tender, distended secondary to body habitus. Bowel sounds positive.  GU: Deferred. Musculoskeletal: No clubbing / cyanosis of digits/nails. No joint deformity upper and lower extremities. Skin: No rashes, lesions, ulcers on limited skin evaluation. No  induration; Warm and dry.  Neurologic: CN 2-12 grossly intact with no focal deficits.  Romberg sign and cerebellar reflexes not assessed.  Psychiatric: Normal judgment and insight. Alert and oriented x 3. Normal mood and appropriate affect.   Data Reviewed: I have personally reviewed following labs and imaging studies  CBC: Recent Labs  Lab 08/28/23 1145 08/29/23 0523  WBC 10.7* 9.3  NEUTROABS 7.1  --   HGB 14.6 13.8  HCT 42.9 40.5  MCV 91.3 92.3  PLT 368 325   Basic Metabolic Panel: Recent Labs  Lab 08/28/23 1145 08/29/23 0523  NA 136 131*  K 4.0 3.4*  CL 103 100  CO2 22 21*  GLUCOSE 94 99  BUN 9 9  CREATININE 0.75 0.71  CALCIUM 9.4 8.9   GFR: Estimated Creatinine Clearance: 107.4 mL/min (by C-G formula based on SCr of 0.71 mg/dL). Liver Function Tests: Recent Labs  Lab 08/28/23 1145  AST 26  ALT 23  ALKPHOS 70  BILITOT 0.8  PROT 8.3*  ALBUMIN 4.4   No results for input(s): "LIPASE",  "AMYLASE" in the last 168 hours. No results for input(s): "AMMONIA" in the last 168 hours. Coagulation Profile: Recent Labs  Lab 08/28/23 1145 08/29/23 0523  INR 0.9 1.0   Cardiac Enzymes: No results for input(s): "CKTOTAL", "CKMB", "CKMBINDEX", "TROPONINI" in the last 168 hours. BNP (last 3 results) No results for input(s): "PROBNP" in the last 8760 hours. HbA1C: No results for input(s): "HGBA1C" in the last 72 hours. CBG: No results for input(s): "GLUCAP" in the last 168 hours. Lipid Profile: No results for input(s): "CHOL", "HDL", "LDLCALC", "TRIG", "CHOLHDL", "LDLDIRECT" in the last 72 hours. Thyroid Function Tests: Recent Labs    08/29/23 0532  TSH 0.826   Anemia Panel: Recent Labs    08/29/23 0532  VITAMINB12 306   Sepsis Labs: Recent Labs  Lab 08/28/23 1152 08/28/23 1359  LATICACIDVEN 3.2* 1.9   Recent Results (from the past 240 hour(s))  Blood Culture (routine x 2)     Status: None (Preliminary result)   Collection Time: 08/28/23 11:45 AM   Specimen: BLOOD  Result Value Ref Range Status   Specimen Description   Final    BLOOD SITE NOT SPECIFIED Performed at [facility], [address] [place], [place] [address]    Special Requests   Final    BOTTLES DRAWN AEROBIC AND ANAEROBIC Blood Culture adequate volume Performed at [facility], [address], [place], [place] [address]    Culture   Final    NO GROWTH < 24 HOURS Performed at [facility], [address], [place], [place] [address]    Report Status PENDING  Incomplete  Blood Culture (routine x 2)     Status: None (Preliminary result)   Collection Time: 08/28/23 12:15 PM   Specimen: BLOOD  Result Value Ref Range Status   Specimen Description   Final    BLOOD BLOOD LEFT HAND Performed at [facility], [address] [place], [place] [address]    Special Requests   Final    BOTTLES DRAWN AEROBIC AND ANAEROBIC Blood Culture  adequate volume Performed at [facility], [address] [place], [place] [address]    Culture   Final    NO GROWTH < 24 HOURS Performed at [facility], [address] [place], [place] [address]    Report Status PENDING  Incomplete  SARS Coronavirus 2 by RT PCR (hospital order,  performed in [facility] hospital lab) *cepheid single result test* Anterior Nasal Swab     Status: None   Collection Time: 08/28/23  8:46 PM   Specimen: Anterior Nasal Swab  Result Value Ref Range Status   SARS Coronavirus 2 by RT PCR NEGATIVE NEGATIVE Final    Comment: (NOTE) SARS-CoV-2 target nucleic acids are NOT DETECTED.  The SARS-CoV-2 RNA is generally detectable in upper and lower respiratory specimens during the acute phase of infection. The lowest concentration of SARS-CoV-2 viral copies this assay can detect is 250 copies / mL. A negative result does not preclude SARS-CoV-2 infection and should not be used as the sole basis for treatment or other patient management decisions.  A negative result may occur with improper specimen collection / handling, submission of specimen other than nasopharyngeal swab, presence of viral mutation(s) within the areas targeted by this assay, and inadequate number of viral copies (<250 copies / mL). A negative result must be combined with clinical observations, patient history, and epidemiological information.  Fact Sheet for Patients:   RoadLapTop.co.za  Fact Sheet for Healthcare Providers: http://kim-miller.com/  This test is not yet approved or  cleared by the [place] FDA and has been authorized for detection and/or diagnosis of SARS-CoV-2 by FDA under an Emergency Use Authorization (EUA).  This EUA will remain in effect (meaning this test can be used) for the duration of the COVID-19 declaration under Section 564(b)(1) of the Act, 21 U.S.C. section 360bbb-3(b)(1), unless the  authorization is terminated or revoked sooner.  Performed at [facility], [address] [place], [place] [address]   Respiratory (~20 pathogens) panel by PCR     Status: None   Collection Time: 08/28/23  8:46 PM   Specimen: Anterior Nasal Swab; Respiratory  Result Value Ref Range Status   Adenovirus NOT DETECTED NOT DETECTED Final   Coronavirus 229E NOT DETECTED NOT DETECTED Final    Comment: (NOTE) The Coronavirus on the Respiratory Panel, DOES NOT test for the novel  Coronavirus (2019 nCoV)    Coronavirus HKU1 NOT DETECTED NOT DETECTED Final   Coronavirus NL63 NOT DETECTED NOT DETECTED Final   Coronavirus OC43 NOT DETECTED NOT DETECTED Final   Metapneumovirus NOT DETECTED NOT DETECTED Final   Rhinovirus / Enterovirus NOT DETECTED NOT DETECTED Final   Influenza A NOT DETECTED NOT DETECTED Final   Influenza B NOT DETECTED NOT DETECTED Final   Parainfluenza Virus 1 NOT DETECTED NOT DETECTED Final   Parainfluenza Virus 2 NOT DETECTED NOT DETECTED Final   Parainfluenza Virus 3 NOT DETECTED NOT DETECTED Final   Parainfluenza Virus 4 NOT DETECTED NOT DETECTED Final   Respiratory Syncytial Virus NOT DETECTED NOT DETECTED Final   Bordetella pertussis NOT DETECTED NOT DETECTED Final   Bordetella Parapertussis NOT DETECTED NOT DETECTED Final   Chlamydophila pneumoniae NOT DETECTED NOT DETECTED Final   Mycoplasma pneumoniae NOT DETECTED NOT DETECTED Final    Comment: Performed at [facility], [address] [place], [place] [address]    Radiology Studies: VAS [name] LOWER EXTREMITY VENOUS (DVT)  Result Date: 08/29/2023  Lower Venous DVT Study Patient Name:  BRNDON MIYA  Date of Exam:   08/29/2023 Medical Rec #: [id number]   Accession #:    [id number] Date of Birth: 1966-04-18   Patient Gender: M Patient Age:   89 years Exam Location:  [facility] Procedure:      VAS [name] LOWER EXTREMITY VENOUS (DVT) Referring Phys: [facility] GOEL  --------------------------------------------------------------------------------  Indications: Swelling, and Edema.  Risk Factors: None identified. Comparison Study: No prior study Performing Technologist: Shona Simpson  Examination Guidelines: A complete evaluation includes B-mode imaging, spectral Doppler, color Doppler, and power Doppler as needed of all accessible portions of each vessel. Bilateral testing is considered an integral part of a complete examination. Limited examinations for reoccurring indications may be performed as noted. The reflux portion of the exam is performed with the patient in reverse Trendelenburg.  +---------+---------------+---------+-----------+----------+--------------+ RIGHT    CompressibilityPhasicitySpontaneityPropertiesThrombus Aging +---------+---------------+---------+-----------+----------+--------------+ CFV      Full           Yes      Yes                                 +---------+---------------+---------+-----------+----------+--------------+ SFJ      Full                                                        +---------+---------------+---------+-----------+----------+--------------+ FV Prox  Full                                                        +---------+---------------+---------+-----------+----------+--------------+ FV Mid   Full                                                        +---------+---------------+---------+-----------+----------+--------------+ FV DistalFull                                                        +---------+---------------+---------+-----------+----------+--------------+ PFV      Full                                                        +---------+---------------+---------+-----------+----------+--------------+ POP      Full           Yes      Yes                                 +---------+---------------+---------+-----------+----------+--------------+ PTV      Full                                                         +---------+---------------+---------+-----------+----------+--------------+ PERO     Full                                                        +---------+---------------+---------+-----------+----------+--------------+   +---------+---------------+---------+-----------+----------+--------------+  LEFT     CompressibilityPhasicitySpontaneityPropertiesThrombus Aging +---------+---------------+---------+-----------+----------+--------------+ CFV      Full           Yes      Yes                                 +---------+---------------+---------+-----------+----------+--------------+ SFJ      Full                                                        +---------+---------------+---------+-----------+----------+--------------+ FV Prox  Full                                                        +---------+---------------+---------+-----------+----------+--------------+ FV Mid   Full                                                        +---------+---------------+---------+-----------+----------+--------------+ FV DistalFull                                                        +---------+---------------+---------+-----------+----------+--------------+ PFV      Full                                                        +---------+---------------+---------+-----------+----------+--------------+ POP      Full           Yes      Yes                                 +---------+---------------+---------+-----------+----------+--------------+ PTV      Full                                                        +---------+---------------+---------+-----------+----------+--------------+ PERO     Full                                                        +---------+---------------+---------+-----------+----------+--------------+    Summary: BILATERAL: - No evidence of deep vein thrombosis seen in  the lower extremities, bilaterally. -No evidence of popliteal cyst, bilaterally.   *See table(s) above for measurements and observations.    Preliminary    DG CHEST PORT 1 VIEW  Result  Date: 08/29/2023 CLINICAL DATA:  Shortness of breath. EXAM: PORTABLE CHEST 1 VIEW COMPARISON:  August 28, 2023 FINDINGS: Cardiomediastinal silhouette is normal. Mediastinal contours appear intact. Subtle residual linear airspace consolidation in the right upper thorax. No pneumothorax. Left lung is clear. Osseous structures are without acute abnormality. Soft tissues are grossly normal. IMPRESSION: Subtle residual linear airspace consolidation in the right upper thorax. Electronically Signed   By: Ted Mcalpine M.D.   On: 08/29/2023 14:44   CT Angio Chest PE W and/or Wo Contrast  Result Date: 08/28/2023 CLINICAL DATA:  Pneumonia for a month.  On antibiotics. EXAM: CT ANGIOGRAPHY CHEST WITH CONTRAST TECHNIQUE: Multidetector CT imaging of the chest was performed using the standard protocol during bolus administration of intravenous contrast. Multiplanar CT image reconstructions and MIPs were obtained to evaluate the vascular anatomy. RADIATION DOSE REDUCTION: This exam was performed according to the departmental dose-optimization program which includes automated exposure control, adjustment of the mA and/or kV according to patient size and/or use of iterative reconstruction technique. CONTRAST:  75mL OMNIPAQUE IOHEXOL 350 MG/ML SOLN COMPARISON:  Plain film of earlier today chest CT 05/06/2022 FINDINGS: Cardiovascular: The quality of this exam for evaluation of pulmonary embolism is moderate. The bolus is centered in the SVC. No pulmonary embolism to the large segmental level. Normal aortic caliber. Normal heart size, without pericardial effusion. Mild LAD coronary artery calcification. Mediastinum/Nodes: No mediastinal or hilar adenopathy. Lungs/Pleura: No pleural fluid. Patent airways, including to the right upper  lobe. Posterior right upper lobe airspace and less so ground-glass opacity is new since the prior CT. Upper Abdomen: Normal imaged portions of the liver, spleen, stomach, pancreas, gallbladder, adrenal glands, kidneys. Musculoskeletal: Superior endplate irregularity and sclerosis at T12 is most likely degenerative versus less likely related to remote compression fracture. Review of the MIP images confirms the above findings. Mild S shaped thoracolumbar spine curvature. IMPRESSION: 1. No pulmonary embolism with mild limitations as detailed above. 2. Posterior right upper lobe airspace and ground-glass opacity consistent with pneumonia. No centrally obstructive mass identified. 3. Mild coronary artery calcification. Electronically Signed   By: Jeronimo Greaves M.D.   On: 08/28/2023 17:20   DG Chest Port 1 View  Result Date: 08/28/2023 CLINICAL DATA:  Recent history of pneumonia. EXAM: PORTABLE CHEST 1 VIEW COMPARISON:  Chest radiograph dated August 18, 2023. FINDINGS: The heart size and mediastinal contours are within normal limits. Persistent slightly decreased confluent airspace opacity in the right upper lobe. No new focal consolidation. No pleural effusion or pneumothorax. No acute osseous abnormality. IMPRESSION: Persistent slightly decreased confluent airspace opacity in the right upper lobe is concerning for pneumonia. Followup PA and lateral chest X-ray is recommended in 6-8 weeks to ensure resolution and exclude underlying malignancy Electronically Signed   By: Hart Robinsons M.D.   On: 08/28/2023 16:08    Scheduled Meds:  albuterol  2.5 mg Nebulization TID   aspirin EC  81 mg Oral Daily   [START ON 08/30/2023] azithromycin  500 mg Oral Daily   cholecalciferol  1,000 Units Oral Daily   enoxaparin (LOVENOX) injection  40 mg Subcutaneous Q24H   gabapentin  100 mg Oral TID   guaiFENesin  1,200 mg Oral BID   irbesartan  300 mg Oral Daily   levothyroxine  150 mcg Oral Q0600   potassium chloride  40  mEq Oral BID   sodium chloride flush  3 mL Intravenous Q12H   Continuous Infusions:  cefTRIAXone (ROCEPHIN)  IV Stopped (08/29/23 1039)    LOS:  1 day   Marguerita Merles, DO [facility] Available via Epic secure chat 7am-7pm After these hours, please refer to coverage provider listed on [email or web address] 08/29/2023, 6:51 PM

## 2023-08-29 NOTE — Plan of Care (Signed)

## 2023-08-29 NOTE — Consult Note (Signed)
NAME:  Jonathan Luna, MRN:  [id number], DOB:  09-25-1966, LOS: 1 ADMISSION DATE:  08/28/2023, CONSULTATION DATE:  08/29/23 REFERRING MD:  Marland Mcalpine, CHIEF COMPLAINT:  Fatigue   History of Present Illness:  57 year old nonsmoker who is presenting with dry cough and fatigue x 1 months.  He has been diagnosed with RUL PNA and has failed azithromycin and augmentin courses.  CT scan showing RUL infilrtate; he has been placed on ceftriaxone and azithromycin and feels somewhat better.  No sick contacts, does have some subjective fevers.  Cough is dry.  No pleurisy.  No weight changes.  No aspiration symptoms. Only other things on ROS are some burning pain in feet and some numbness in fingers.  Only thing on exposure was potentially some black mold and cleaner inhalation 1 year ago.  Pertinent  Medical History   Past Medical History:  Diagnosis Date   Allergy    Anxiety    Arthritis    Cataract    Depression    Detached retina    Heart murmur    History of bilateral cataract extraction    Hyperlipidemia    Hypothyroidism    Low back pain    Panic attack    Panic attacks      Significant Hospital Events: Including procedures, antibiotic start and stop dates in addition to other pertinent events   11/15 admit 11/16 consult  Interim History / Subjective:  Consult  Objective   Blood pressure (!) 141/98, pulse (!) 106, temperature 98.6 F (37 C), temperature source Oral, resp. rate 18, SpO2 97%.        Intake/Output Summary (Last 24 hours) at 08/29/2023 1438 Last data filed at 08/29/2023 1115 Gross per 24 hour  Intake 1372.08 ml  Output 1800 ml  Net -427.92 ml   There were no vitals filed for this visit.  Examination: General: no distress HENT: MMM, trachea midline Lungs: lungs mostly clear, no accessory muscle use Cardiovascular: tachy, ext warm Abdomen: soft, +BS Extremities: no edema, no signs of arthritis Neuro: moves to command Skin: no rashes  CXR, Cts, labs  personally reviewed  Resolved Hospital Problem list   N/A  Assessment & Plan:  Nonresolving pna- interestingly his CXR today looks markedly improved from admission so whatever treatments we are doing seem to be working.  May want to consider a slightly longer course of therapy.  - Continue CAP tx for now - If feels well tomorrow maybe try a combo cefdinir/doxy x 7 more days - If recurs again will need bronch r/o COP  Best Practice (right click and "Reselect all SmartList Selections" daily)  Per primary  Labs   CBC: Recent Labs  Lab 08/28/23 1145 08/29/23 0523  WBC 10.7* 9.3  NEUTROABS 7.1  --   HGB 14.6 13.8  HCT 42.9 40.5  MCV 91.3 92.3  PLT 368 325    Basic Metabolic Panel: Recent Labs  Lab 08/28/23 1145 08/29/23 0523  NA 136 131*  K 4.0 3.4*  CL 103 100  CO2 22 21*  GLUCOSE 94 99  BUN 9 9  CREATININE 0.75 0.71  CALCIUM 9.4 8.9   GFR: Estimated Creatinine Clearance: 107.4 mL/min (by C-G formula based on SCr of 0.71 mg/dL). Recent Labs  Lab 08/28/23 1145 08/28/23 1152 08/28/23 1359 08/29/23 0523  WBC 10.7*  --   --  9.3  LATICACIDVEN  --  3.2* 1.9  --     Liver Function Tests: Recent Labs  Lab 08/28/23 1145  AST 26  ALT 23  ALKPHOS 70  BILITOT 0.8  PROT 8.3*  ALBUMIN 4.4   No results for input(s): "LIPASE", "AMYLASE" in the last 168 hours. No results for input(s): "AMMONIA" in the last 168 hours.  ABG No results found for: "PHART", "PCO2ART", "PO2ART", "HCO3", "TCO2", "ACIDBASEDEF", "O2SAT"   Coagulation Profile: Recent Labs  Lab 08/28/23 1145 08/29/23 0523  INR 0.9 1.0    Cardiac Enzymes: No results for input(s): "CKTOTAL", "CKMB", "CKMBINDEX", "TROPONINI" in the last 168 hours.  HbA1C: Hgb A1c MFr Bld  Date/Time Value Ref Range Status  10/27/2022 04:27 PM 5.5 4.6 - 6.5 % Final    Comment:    Glycemic Control Guidelines for People with Diabetes:Non Diabetic:  <6%Goal of Therapy: <7%Additional Action Suggested:  >8%    07/28/2012 08:46 AM 5.2 4.6 - 6.5 % Final    Comment:    Glycemic Control Guidelines for People with Diabetes:Non Diabetic:  <6%Goal of Therapy: <7%Additional Action Suggested:  >8%     CBG: No results for input(s): "GLUCAP" in the last 168 hours.  Review of Systems:    Positive Symptoms in bold:  Constitutional fevers, chills, weight loss, fatigue, anorexia, malaise  Eyes decreased vision, double vision, eye irritation  Ears, Nose, Mouth, Throat sore throat, trouble swallowing, sinus congestion  Cardiovascular chest pain, paroxysmal nocturnal dyspnea, lower ext edema, palpitations   Respiratory SOB, cough, DOE, hemoptysis, wheezing  Gastrointestinal nausea, vomiting, diarrhea  Genitourinary burning with urination, trouble urinating  Musculoskeletal joint aches, joint swelling, back pain  Integumentary  rashes, skin lesions  Neurological focal weakness, focal numbness, trouble speaking, headaches  Psychiatric depression, anxiety, confusion  Endocrine polyuria, polydipsia, cold intolerance, heat intolerance  Hematologic abnormal bruising, abnormal bleeding, unexplained nose bleeds  Allergic/Immunologic recurrent infections, hives, swollen lymph nodes     Past Medical History:  He,  has a past medical history of Allergy, Anxiety, Arthritis, Cataract, Depression, Detached retina, Heart murmur, History of bilateral cataract extraction, Hyperlipidemia, Hypothyroidism, Low back pain, Panic attack, and Panic attacks.   Surgical History:   Past Surgical History:  Procedure Laterality Date   CATARACT EXTRACTION     EYE SURGERY       Social History:   reports that he has never smoked. He has been exposed to tobacco smoke. He has never used smokeless tobacco. He reports that he does not currently use alcohol. He reports that he does not use drugs.   Family History:  His family history includes Asthma in his father; Breast cancer in his mother; Diabetes in his brother; Emphysema in  his father; Heart disease in his father; Hyperlipidemia in his brother; Hypertension in his father.   Allergies Allergies  Allergen Reactions   Amoxicillin Other (See Comments)    Pt states that it does not work for him when he had back to back pneumonia   Effexor [Venlafaxine] Diarrhea   Fluoxetine Hcl    Prozac [Fluoxetine Hcl] Diarrhea     Home Medications  Prior to Admission medications   Medication Sig Start Date End Date Taking? Authorizing Provider  albuterol (VENTOLIN HFA) 108 (90 Base) MCG/ACT inhaler Inhale 2 puffs into the lungs every 4 (four) hours as needed. 07/24/23  Yes [provider]  aspirin EC 81 MG tablet Take 81 mg by mouth daily. Swallow whole.   Yes [provider]  Cholecalciferol (VITAMIN D-3) 25 MCG (1000 UT) CAPS Take 1 capsule by mouth daily.   Yes [provider]  clonazePAM (KLONOPIN) 0.5 MG tablet  Take 1 tablet (0.5 mg total) by mouth 2 (two) times daily as needed for anxiety. 08/21/23  Yes [name], MD  fluticasone (FLONASE) 50 MCG/ACT nasal spray Place 2 sprays into both nostrils daily. 08/18/23  Yes [name], MD  indapamide (LOZOL) 1.25 MG tablet Take 1 tablet (1.25 mg total) by mouth daily. 05/27/23  Yes [name], MD  irbesartan (AVAPRO) 300 MG tablet Take 1 tablet (300 mg total) by mouth daily. 05/27/23  Yes [name], MD  levothyroxine (SYNTHROID) 150 MCG tablet TAKE ONE TABLET BY MOUTH ONCE A DAY BEFORE BREAKFAST 08/03/23  Yes [name], MD  Multiple Vitamins-Minerals (ZINC PO) Take by mouth.   Yes [provider]  oxyCODONE-acetaminophen (PERCOCET/ROXICET) 5-325 MG tablet Take 1 tablet by mouth every 8 (eight) hours as needed for severe pain (pain score 7-10). 07/30/23  Yes [name], MD  promethazine-dextromethorphan (PROMETHAZINE-DM) 6.25-15 MG/5ML syrup Take 5 mLs by mouth 4 (four) times daily as needed for cough. 08/19/23  Yes [name], MD  desvenlafaxine (PRISTIQ) 25 MG  24 hr tablet Take 1 tablet (25 mg total) by mouth daily. Patient not taking: Reported on 08/28/2023 08/17/23   Gwenlyn Fudge, FNP  omega-3 acid ethyl esters (LOVAZA) 1 g capsule TAKE TWO CAPSULES BY MOUTH TWICE DAILY Patient not taking: Reported on 08/10/2023 01/14/23   [name], MD     Critical care time: N/A

## 2023-08-29 NOTE — Hospital Course (Addendum)
The patient is a 57 year old Caucasian male with a past medical history significant for but not limited to panic attacks and anxiety as well as other comorbidities who presents with new onset cough and fatigue and subjective fevers approximate month ago.  At that time he was diagnosed with pneumonia and completed course of azithromycin.  Subsequently patient had persistent symptoms and completed course of Augmentin earlier this month unfortunately continues to have this persistent cough with very scant sputum and marked fatigue.  He had no reported shortness of breath however he returned to his PCP office yesterday and reports calling a fever 101.  He appeared to be ill-appearing at his PCP as well as diaphoretic with heart rates in the 127 so he was transferred to [facility] for further evaluation.  Of note he is also been having 3 to 4 weeks of leg numbness and tingling in bilateral fingers as well is being worked up for peripheral neuropathy and will get a hemoglobin A1c in a.m.   Given his persistent pneumonia pulmonary was consulted and recommending continuing CAP coverage for now.  Assessment and Plan:  Sepsis in the setting of Pneumonia of right upper lobe due to infectious organism -This seems to have been diagnosed in mid-October, patient has completed a course of both azithromycin and subsequently Augmentin.   -Unfortunately he has had persistent cough, fatigue, tachycardia, tachypnea and noted to have mild leukocytosis here.  -Patient also had some lactic acidosis in the ER that has resolved.  - D-dimer was positive CAT scan PE protocol negative for pulmonary embolism, however showing posterior right upper lobe airspace and groundglass opacity consistent with pneumonia.   -Respiratory Virus panel negative  Patient is appearing nontoxic at this time but has significant cough -Started on IV Ceftriaxone and Azithromycin which will be continued.   -Blood cultures drawn in the ER already.    -Pending Legionella antigen testing, Strep Pnuemo Urinary Ag and Sputum Cx -S.p 1 liter NS in ER.  -Given his persistence consulted Pulmonary and they are recommending continue Treatment for now and if he feels well tomorrow they are recommending trying Ceftin and Doxy for 7 more days but if reoccurs regular consider bronchoscopy to rule out COP -Repeat CXR done this AM and showed "Subtle residual linear airspace consolidation in the right upper thorax. No pneumothorax. Left lung is clear. Osseous structures are without acute abnormality. Soft tissues are grossly normal."  Peripheral Neuropathy -Check B12 TSH, give empiric multivitamin.  -C/w Gabapentin 100 mg po TID -Check hemoglobin A1c in a.m.  Elevated D-Dimer -Check LE Venous Duplex and showed "No evidence of deep vein thrombosis seen in the lower extremities, bilaterally. No evidence of popliteal cyst, bilaterally." -CTA showed no Pulmonary Emoblus   Essential Hypertension -C/w Irbesartan 300 mg po Daily -Continue to Monitor BP per Protocol -Last BP reading was 141/98  Hypothyroidism -Checked TSH and was 0.826 -C/w Levothyroxine 150 mcg po Daily   Hyponatremia -Na+ Trend: Recent Labs  Lab 08/10/23 1022 08/18/23 1114 08/28/23 1145 08/29/23 0523 08/30/23 0423  NA 134* 135 136 131* 138  -Continue to Monitor and Trend and repeat CMP in the AM  Hypokalemia -Patient's K+ Level Trend: Recent Labs  Lab 08/10/23 1022 08/18/23 1114 08/28/23 1145 08/29/23 0523 08/30/23 0423  K 3.9 3.8 4.0 3.4* 3.7  -Replete with po KCL 40 mEQ BID -Continue to Monitor and Replete as Necessary -Repeat CMP in the AM   Obesity -Complicates overall prognosis and care -Estimated body mass index is 30.11  kg/m as calculated from the following:   Height as of this encounter: 5\' 7"  (1.702 m).   Weight as of this encounter: 87.2 kg.  -Weight Loss and Dietary Counseling given

## 2023-08-29 NOTE — Progress Notes (Signed)
Lower extremity venous duplex completed. Please see CV Procedures for preliminary results.  Shona Simpson, RVT 08/29/23 2:53 PM

## 2023-08-30 ENCOUNTER — Inpatient Hospital Stay (HOSPITAL_COMMUNITY): Payer: 59

## 2023-08-30 ENCOUNTER — Encounter: Payer: Self-pay | Admitting: Internal Medicine

## 2023-08-30 ENCOUNTER — Other Ambulatory Visit: Payer: Self-pay | Admitting: Internal Medicine

## 2023-08-30 ENCOUNTER — Telehealth: Payer: Self-pay | Admitting: Internal Medicine

## 2023-08-30 DIAGNOSIS — G629 Polyneuropathy, unspecified: Secondary | ICD-10-CM | POA: Diagnosis not present

## 2023-08-30 DIAGNOSIS — M51369 Other intervertebral disc degeneration, lumbar region without mention of lumbar back pain or lower extremity pain: Secondary | ICD-10-CM

## 2023-08-30 DIAGNOSIS — G894 Chronic pain syndrome: Secondary | ICD-10-CM

## 2023-08-30 DIAGNOSIS — M5030 Other cervical disc degeneration, unspecified cervical region: Secondary | ICD-10-CM

## 2023-08-30 DIAGNOSIS — G8929 Other chronic pain: Secondary | ICD-10-CM

## 2023-08-30 DIAGNOSIS — J189 Pneumonia, unspecified organism: Secondary | ICD-10-CM | POA: Diagnosis not present

## 2023-08-30 LAB — COMPREHENSIVE METABOLIC PANEL
ALT: 20 U/L (ref 0–44)
AST: 19 U/L (ref 15–41)
Albumin: 3.9 g/dL (ref 3.5–5.0)
Alkaline Phosphatase: 63 U/L (ref 38–126)
Anion gap: 10 (ref 5–15)
BUN: 8 mg/dL (ref 6–20)
CO2: 22 mmol/L (ref 22–32)
Calcium: 9.1 mg/dL (ref 8.9–10.3)
Chloride: 106 mmol/L (ref 98–111)
Creatinine, Ser: 0.76 mg/dL (ref 0.61–1.24)
GFR, Estimated: >60 mL/min (ref >60)
Glucose, Bld: 96 mg/dL (ref 70–99)
Potassium: 3.7 mmol/L (ref 3.5–5.1)
Sodium: 138 mmol/L (ref 135–145)
Total Bilirubin: 0.9 mg/dL (ref <1.2)
Total Protein: 7.4 g/dL (ref 6.5–8.1)

## 2023-08-30 LAB — CBC WITH DIFFERENTIAL/PLATELET
Abs Immature Granulocytes: 0.03 10*3/uL (ref 0.00–0.07)
Basophils Absolute: 0.1 10*3/uL (ref 0.0–0.1)
Basophils Relative: 1 %
Eosinophils Absolute: 0.7 10*3/uL — ABNORMAL HIGH (ref 0.0–0.5)
Eosinophils Relative: 8 %
HCT: 42.8 % (ref 39.0–52.0)
Hemoglobin: 14 g/dL (ref 13.0–17.0)
Immature Granulocytes: 0 %
Lymphocytes Relative: 36 %
Lymphs Abs: 3 10*3/uL (ref 0.7–4.0)
MCH: 31 pg (ref 26.0–34.0)
MCHC: 32.7 g/dL (ref 30.0–36.0)
MCV: 94.9 fL (ref 80.0–100.0)
Monocytes Absolute: 0.8 10*3/uL (ref 0.1–1.0)
Monocytes Relative: 9 %
Neutro Abs: 3.8 10*3/uL (ref 1.7–7.7)
Neutrophils Relative %: 46 %
Platelets: 334 10*3/uL (ref 150–400)
RBC: 4.51 MIL/uL (ref 4.22–5.81)
RDW: 14.3 % (ref 11.5–15.5)
WBC: 8.5 10*3/uL (ref 4.0–10.5)
nRBC: 0 % (ref 0.0–0.2)

## 2023-08-30 LAB — EXPECTORATED SPUTUM ASSESSMENT W GRAM STAIN, RFLX TO RESP C

## 2023-08-30 LAB — STREP PNEUMONIAE URINARY ANTIGEN: Strep Pneumo Urinary Antigen: NEGATIVE

## 2023-08-30 LAB — PHOSPHORUS: Phosphorus: 4.3 mg/dL (ref 2.5–4.6)

## 2023-08-30 LAB — LEGIONELLA PNEUMOPHILA SEROGP 1 UR AG: L. pneumophila Serogp 1 Ur Ag: NEGATIVE

## 2023-08-30 LAB — MAGNESIUM: Magnesium: 2.2 mg/dL (ref 1.7–2.4)

## 2023-08-30 MED ORDER — HYDROCOD POLI-CHLORPHE POLI ER 10-8 MG/5ML PO SUER: 5.0000 mL | Freq: Two times a day (BID) | ORAL | 0 refills | Status: DC | PRN

## 2023-08-30 MED ORDER — POLYETHYLENE GLYCOL 3350 17 G PO PACK: 17.0000 g | PACK | Freq: Every day | ORAL | 0 refills | Status: DC | PRN

## 2023-08-30 MED ORDER — GUAIFENESIN ER 600 MG PO TB12: 600.0000 mg | ORAL_TABLET | Freq: Two times a day (BID) | ORAL | 0 refills | Status: DC

## 2023-08-30 MED ORDER — ACETAMINOPHEN 325 MG PO TABS: 650.0000 mg | ORAL_TABLET | Freq: Four times a day (QID) | ORAL | 0 refills | Status: DC | PRN

## 2023-08-30 MED ORDER — DOXYCYCLINE HYCLATE 100 MG PO TABS
100.0000 mg | ORAL_TABLET | Freq: Two times a day (BID) | ORAL | 0 refills | Status: AC
Start: 2023-08-30 — End: 2023-09-06

## 2023-08-30 MED ORDER — CEFDINIR 300 MG PO CAPS: 300.0000 mg | ORAL_CAPSULE | Freq: Two times a day (BID) | ORAL | 0 refills | Status: AC

## 2023-08-30 MED ORDER — GABAPENTIN 100 MG PO CAPS: 100.0000 mg | ORAL_CAPSULE | Freq: Three times a day (TID) | ORAL | 0 refills | Status: DC

## 2023-08-30 NOTE — Progress Notes (Signed)
HR at rest 95; o2 stat at rest 99%. Patient HR 101 and O2 95 to 99% while walking.

## 2023-08-30 NOTE — Clinical Social Work Note (Signed)
Transition of Care [facility]) - Inpatient Brief Assessment   Patient Details  Name: Jonathan Luna MRN: [id number] Date of Birth: [birth year]  [facility] of [facility]) CM/SW Contact:    Darleene Cleaver, LCSW Phone Number: 08/30/2023, 2:45 PM   Clinical Narrative:  Patient lives with wife, has a PCP, and does have insurance.  He does not have any SDOH needs.  Transition of Care Asessment: Insurance and Status: Insurance coverage has been reviewed Patient has primary care physician: Yes Home environment has been reviewed: Yes lives with wife Prior level of function:: Radio broadcast assistant Home Services: No current home services Social Determinants of Health Reivew: SDOH reviewed no interventions necessary Readmission risk has been reviewed: Yes Transition of care needs: no transition of care needs at this time

## 2023-08-30 NOTE — Progress Notes (Signed)
08/30/2023     I have seen and evaluated the patient for pneumonia   S:  Starting to cough up phlegm. I don't really understand why he is still on droplet.   O: Blood pressure 96/66, pulse 64, temperature 97.6 F (36.4 C), temperature source Oral, resp. rate 17, height 5\' 3"  (1.6 m), weight 81.6 kg, SpO2 94 %.    No distress Lungs sound clear Heart sounds regular Ext warm Good strength Aox3, mildly anxious  Labs are benign CXR continues to improve  A:  CAP improving Ill defined weakness with some peripheral neuropathy: hopefully associated with above   P:  - Would do 1 week cefdinir and doxycycline - OP neurology referral - Will arrange 3-4 week f/u in office to assure continued improvement; he can call office if issues before then. - Available PRN   Myrla Halsted MD [facility] Prefer epic messenger for cross cover needs If after hours, please call E-link

## 2023-08-30 NOTE — Evaluation (Signed)
Physical Therapy Evaluation Patient Details Name: Jonathan Luna MRN: [id number] DOB: 1966-01-10 Today's Date: 08/30/2023  History of Present Illness  Pt admitted from home 2* sepsis in the setting of ongoing PNA x 5 weeks and new dx of peripheral neuropathy fingers and toes and with hx of LBP, cataract extraction, and panic attacks.  Clinical Impression  Pt admitted as above and presenting with functional mobility limitations 2* decreased endurance/fatigues easily and with mild ambulatory balance deficits and decreased fine motor 2* new dx of peripheral neuropathy.  This date, pt up to ambulate in halls with SaO2 95 - 98% and max HR of 115.  Pt with mild general instability but no overt LOB.  Pt reports dc from hospital planned for this date and will follow up with PCP if symptoms of peripheral neuropathy do not continue to resolve.      If plan is discharge home, recommend the following:     Can travel by private vehicle        Equipment Recommendations None recommended by PT  Recommendations for Other Services       Functional Status Assessment Patient has had a recent decline in their functional status and demonstrates the ability to make significant improvements in function in a reasonable and predictable amount of time.     Precautions / Restrictions Precautions Precautions: Fall Restrictions Weight Bearing Restrictions: No      Mobility  Bed Mobility Overal bed mobility: Modified Independent             General bed mobility comments: no physical assist    Transfers Overall transfer level: Modified independent                 General transfer comment: no physical assist; good stability    Ambulation/Gait Ambulation/Gait assistance: Independent Gait Distance (Feet): 550 Feet Assistive device: None Gait Pattern/deviations: Step-through pattern, Wide base of support, Shuffle Gait velocity: Cues to slow pace and rest as needed based on fatigue level      General Gait Details: Mild general instability with increased BOS to compensate but no overt LOB  Stairs            Wheelchair Mobility     Tilt Bed    Modified Rankin (Stroke Patients Only)       Balance Overall balance assessment: Mild deficits observed, not formally tested Sitting-balance support: No upper extremity supported, Feet supported Sitting balance-Leahy Scale: Normal     Standing balance support: No upper extremity supported Standing balance-Leahy Scale: Good                               Pertinent Vitals/Pain Pain Assessment Pain Assessment: No/denies pain    Home Living Family/patient expects to be discharged to:: Private residence Living Arrangements: Spouse/significant other                      Prior Function Prior Level of Function : Independent/Modified Independent             Mobility Comments: Reports active job "I am on my feet a lot at work"       Extremity/Trunk Assessment   Upper Extremity Assessment Upper Extremity Assessment: RUE deficits/detail;LUE deficits/detail RUE Sensation: history of peripheral neuropathy RUE Coordination: decreased fine motor LUE Coordination: decreased fine motor    Lower Extremity Assessment Lower Extremity Assessment: RLE deficits/detail;LLE deficits/detail RLE Sensation: history of peripheral neuropathy RLE Coordination: decreased  fine motor LLE Sensation: history of peripheral neuropathy LLE Coordination: decreased fine motor    Cervical / Trunk Assessment Cervical / Trunk Assessment: Normal  Communication   Communication Communication: No apparent difficulties  Cognition Arousal: Alert Behavior During Therapy: Anxious, Impulsive Overall Cognitive Status: Within Functional Limits for tasks assessed                                          General Comments      Exercises     Assessment/Plan    PT Assessment Patient does not need any  further PT services  PT Problem List         PT Treatment Interventions      PT Goals (Current goals can be found in the Care Plan section)  Acute Rehab PT Goals Patient Stated Goal: Regain PLOF and activity tolerance PT Goal Formulation: All assessment and education complete, DC therapy    Frequency       Co-evaluation               AM-PAC PT "6 Clicks" Mobility  Outcome Measure Help needed turning from your back to your side while in a flat bed without using bedrails?: None Help needed moving from lying on your back to sitting on the side of a flat bed without using bedrails?: None Help needed moving to and from a bed to a chair (including a wheelchair)?: None Help needed standing up from a chair using your arms (e.g., wheelchair or bedside chair)?: None Help needed to walk in hospital room?: None Help needed climbing 3-5 steps with a railing? : A Little 6 Click Score: 23    End of Session Equipment Utilized During Treatment: Gait belt Activity Tolerance: Patient tolerated treatment well;Patient limited by fatigue Patient left: in bed;with call bell/phone within reach;with family/visitor present Nurse Communication: Mobility status PT Visit Diagnosis: Unsteadiness on feet (R26.81)    Time: 5573-2202 PT Time Calculation (min) (ACUTE ONLY): 26 min   Charges:   PT Evaluation $PT Eval Low Complexity: 1 Low   PT General Charges $$ ACUTE PT VISIT: 1 Visit         Mauro Kaufmann PT Acute Rehabilitation Services Pager [phone] Office [phone]   Concepcion Kirkpatrick 08/30/2023, 5:03 PM

## 2023-08-30 NOTE — Plan of Care (Signed)

## 2023-08-30 NOTE — Plan of Care (Addendum)
Patient verbalized understanding of discharge instructions. No signs or symptoms of acute distress at the time of discharge.  Problem: Education: Goal: Knowledge of General Education information will improve Description: Including pain rating scale, medication(s)/side effects and non-pharmacologic comfort measures Outcome: Adequate for Discharge   Problem: Health Behavior/Discharge Planning: Goal: Ability to manage health-related needs will improve Outcome: Adequate for Discharge   Problem: Clinical Measurements: Goal: Ability to maintain clinical measurements within normal limits will improve Outcome: Adequate for Discharge Goal: Will remain free from infection Outcome: Adequate for Discharge Goal: Diagnostic test results will improve Outcome: Adequate for Discharge Goal: Respiratory complications will improve Outcome: Adequate for Discharge Goal: Cardiovascular complication will be avoided Outcome: Adequate for Discharge   Problem: Activity: Goal: Risk for activity intolerance will decrease Outcome: Adequate for Discharge   Problem: Nutrition: Goal: Adequate nutrition will be maintained Outcome: Adequate for Discharge   Problem: Coping: Goal: Level of anxiety will decrease Outcome: Adequate for Discharge   Problem: Elimination: Goal: Will not experience complications related to bowel motility Outcome: Adequate for Discharge Goal: Will not experience complications related to urinary retention Outcome: Adequate for Discharge   Problem: Pain Management: Goal: General experience of comfort will improve Outcome: Adequate for Discharge   Problem: Safety: Goal: Ability to remain free from injury will improve Outcome: Adequate for Discharge   Problem: Skin Integrity: Goal: Risk for impaired skin integrity will decrease Outcome: Adequate for Discharge

## 2023-08-30 NOTE — Telephone Encounter (Signed)
3-4 week f/u with any MD/APP for pneumonia hospitalization

## 2023-08-30 NOTE — Discharge Summary (Signed)
Physician Discharge Summary   Patient: Jonathan Luna MRN: [id number] DOB: 07/27/1966  Admit date:     08/28/2023  Discharge date: 08/30/23  Discharge Physician: Marguerita Merles, DO   PCP: [name], MD   Recommendations at discharge:  {Tip this will not be part of the note when signed- Example include specific recommendations for outpatient follow-up, pending tests to follow-up on. (Optional):26781}  ***  Discharge Diagnoses: Principal Problem:   Pneumonia of right upper lobe due to infectious organism Active Problems:   Peripheral neuropathy   Community acquired pneumonia  Resolved Problems:   * No resolved hospital problems. [facility] Course: The patient is a 57 year old Caucasian male with a past medical history significant for but not limited to panic attacks and anxiety as well as other comorbidities who presents with new onset cough and fatigue and subjective fevers approximate month ago.  At that time he was diagnosed with pneumonia and completed course of azithromycin.  Subsequently patient had persistent symptoms and completed course of Augmentin earlier this month unfortunately continues to have this persistent cough with very scant sputum and marked fatigue.  He had no reported shortness of breath however he returned to his PCP office yesterday and reports calling a fever 101.  He appeared to be ill-appearing at his PCP as well as diaphoretic with heart rates in the 127 so he was transferred to [facility] for further evaluation.  Of note he is also been having 3 to 4 weeks of leg numbness and tingling in bilateral fingers as well is being worked up for peripheral neuropathy and will get a hemoglobin A1c in a.m.   Given his persistent pneumonia pulmonary was consulted and recommending continuing CAP coverage for now.  Assessment and Plan:  Sepsis in the setting of Pneumonia of right upper lobe due to infectious organism -This seems to have been diagnosed in mid-October,  patient has completed a course of both azithromycin and subsequently Augmentin.   -Unfortunately he has had persistent cough, fatigue, tachycardia, tachypnea and noted to have mild leukocytosis here.  -Patient also had some lactic acidosis in the ER that has resolved.  - D-dimer was positive CAT scan PE protocol negative for pulmonary embolism, however showing posterior right upper lobe airspace and groundglass opacity consistent with pneumonia.   -Respiratory Virus panel negative  Patient is appearing nontoxic at this time but has significant cough -Started on IV Ceftriaxone and Azithromycin which will be continued.   -Blood cultures drawn in the ER already.   -Pending Legionella antigen testing, Strep Pnuemo Urinary Ag and Sputum Cx -S.p 1 liter NS in ER.  -Given his persistence consulted Pulmonary and they are recommending continue Treatment for now and if he feels well tomorrow they are recommending trying Ceftin and Doxy for 7 more days but if reoccurs regular consider bronchoscopy to rule out COP -Repeat CXR done this AM and showed "Subtle residual linear airspace consolidation in the right upper thorax. No pneumothorax. Left lung is clear. Osseous structures are without acute abnormality. Soft tissues are grossly normal."  Peripheral Neuropathy -Check B12 TSH, give empiric multivitamin.  -C/w Gabapentin 100 mg po TID -Check hemoglobin A1c in a.m.  Elevated D-Dimer -Check LE Venous Duplex and showed "No evidence of deep vein thrombosis seen in the lower extremities, bilaterally. No evidence of popliteal cyst, bilaterally." -CTA showed no Pulmonary Emoblus   Essential Hypertension -C/w Irbesartan 300 mg po Daily -Continue to Monitor BP per Protocol -Last BP reading was 141/98  Hypothyroidism -Checked TSH and was 0.826 -C/w Levothyroxine 150 mcg po Daily   Hyponatremia -Na+ Trend: Recent Labs  Lab 08/10/23 1022 08/18/23 1114 08/28/23 1145 08/29/23 0523 08/30/23 0423  NA  134* 135 136 131* 138  -Continue to Monitor and Trend and repeat CMP in the AM  Hypokalemia -Patient's K+ Level Trend: Recent Labs  Lab 08/10/23 1022 08/18/23 1114 08/28/23 1145 08/29/23 0523 08/30/23 0423  K 3.9 3.8 4.0 3.4* 3.7  -Replete with po KCL 40 mEQ BID -Continue to Monitor and Replete as Necessary -Repeat CMP in the AM   Obesity -Complicates overall prognosis and care -Estimated body mass index is 30.11 kg/m as calculated from the following:   Height as of this encounter: 5\' 7"  (1.702 m).   Weight as of this encounter: 87.2 kg.  -Weight Loss and Dietary Counseling given    Assessment and Plan: * Pneumonia of right upper lobe due to infectious organism See HPI. This seems to have been diagnosed in mid-October, patient has completed a course of both azithromycin and subsequently Augmentin.  Unfortunately persistent cough, fatigue, tachycardia and noted to have mild leukocytosis here. Today:  Patient also had some lactic acidosis in the ER that has resolved.  D-dimer was positive CAT scan PE protocol negative for pulmonary embolism, however showing posterior right upper lobe airspace and groundglass opacity consistent with pneumonia.  I will check respiratory viral panel.  Patient is appearing nontoxic at this time.  Started on ceftriaxone and azithromycin.  Blood cultures drawn in the ER already.  Continue with same.  Pending Legionella antigen testing. S.p 1 liter NS in ER. C.w. regular diet for patient.  Peripheral neuropathy HPI, this seems to be new diagnosis.  I will check B12 TSH, give empiric multivitamin.  Gabapentin.      {Tip this will not be part of the note when signed Body mass index is 30.11 kg/m. , ,  (Optional):26781}  {(NOTE) Pain control PDMP Statment (Optional):26782} Consultants: *** Procedures performed: ***  Disposition: {Plan; Disposition:26390} Diet recommendation:  {Diet_Plan:26776} DISCHARGE MEDICATION: Allergies as of 08/30/2023        Reactions   Amoxicillin Other (See Comments)   Pt states that it does not work for him when he had back to back pneumonia   Effexor [venlafaxine] Diarrhea   Fluoxetine Hcl    Prozac [fluoxetine Hcl] Diarrhea        Medication List     STOP taking these medications    amoxicillin-clavulanate 875-125 MG tablet Commonly known as: AUGMENTIN   promethazine-dextromethorphan 6.25-15 MG/5ML syrup Commonly known as: PROMETHAZINE-DM       TAKE these medications    acetaminophen 325 MG tablet Commonly known as: TYLENOL Take 2 tablets (650 mg total) by mouth every 6 (six) hours as needed for mild pain (pain score 1-3) (or Fever >/= 101).   albuterol 108 (90 Base) MCG/ACT inhaler Commonly known as: VENTOLIN HFA Inhale 2 puffs into the lungs every 4 (four) hours as needed.   aspirin EC 81 MG tablet Take 81 mg by mouth daily. Swallow whole.   cefdinir 300 MG capsule Commonly known as: OMNICEF Take 1 capsule (300 mg total) by mouth 2 (two) times daily for 7 days.   chlorpheniramine-HYDROcodone 10-8 MG/5ML Commonly known as: TUSSIONEX Take 5 mLs by mouth every 12 (twelve) hours as needed for cough (Refractory Cough).   clonazePAM 0.5 MG tablet Commonly known as: KLONOPIN Take 1 tablet (0.5 mg total) by mouth 2 (two) times daily as needed for  anxiety.   desvenlafaxine 25 MG 24 hr tablet Commonly known as: Pristiq Take 1 tablet (25 mg total) by mouth daily.   doxycycline 100 MG tablet Commonly known as: VIBRA-TABS Take 1 tablet (100 mg total) by mouth 2 (two) times daily for 7 days.   fluticasone 50 MCG/ACT nasal spray Commonly known as: FLONASE Place 2 sprays into both nostrils daily.   gabapentin 100 MG capsule Commonly known as: NEURONTIN Take 1 capsule (100 mg total) by mouth 3 (three) times daily.   guaiFENesin 600 MG 12 hr tablet Commonly known as: MUCINEX Take 1 tablet (600 mg total) by mouth 2 (two) times daily for 5 days.   indapamide 1.25 MG  tablet Commonly known as: LOZOL Take 1 tablet (1.25 mg total) by mouth daily.   irbesartan 300 MG tablet Commonly known as: AVAPRO Take 1 tablet (300 mg total) by mouth daily.   levothyroxine 150 MCG tablet Commonly known as: SYNTHROID TAKE ONE TABLET BY MOUTH ONCE A DAY BEFORE BREAKFAST   omega-3 acid ethyl esters 1 g capsule Commonly known as: LOVAZA TAKE TWO CAPSULES BY MOUTH TWICE DAILY   oxyCODONE-acetaminophen 5-325 MG tablet Commonly known as: PERCOCET/ROXICET Take 1 tablet by mouth every 8 (eight) hours as needed for severe pain (pain score 7-10).   polyethylene glycol 17 g packet Commonly known as: MIRALAX / GLYCOLAX Take 17 g by mouth daily as needed for mild constipation.   Vitamin D-3 25 MCG (1000 UT) Caps Take 1 capsule by mouth daily.   ZINC PO Take by mouth.        Follow-up Information     Lorin Glass, MD Follow up in 3 week(s).   Specialty: Pulmonary Disease Why: We will call you w/ appt date/time.  Call if symptoms return sooner. Contact information: [address] [place] [address] [phone]                Discharge Exam: Ceasar Mons Weights   08/29/23 1900  Weight: 87.2 kg   ***  Condition at discharge: {DC Condition:26389}  The results of significant diagnostics from this hospitalization (including imaging, microbiology, ancillary and laboratory) are listed below for reference.   Imaging Studies: VAS [name] LOWER EXTREMITY VENOUS (DVT)  Result Date: 08/30/2023  Lower Venous DVT Study Patient Name:  Jonathan Luna  Date of Exam:   08/29/2023 Medical Rec #: [id number]   Accession #:    [id number] Date of Birth: 1965/12/31   Patient Gender: M Patient Age:   59 years Exam Location:  [facility] Procedure:      VAS [name] LOWER EXTREMITY VENOUS (DVT) Referring Phys: [facility] GOEL --------------------------------------------------------------------------------  Indications: Swelling, and Edema.  Risk Factors: None identified. Comparison  Study: No prior study Performing Technologist: Shona Simpson  Examination Guidelines: A complete evaluation includes B-mode imaging, spectral Doppler, color Doppler, and power Doppler as needed of all accessible portions of each vessel. Bilateral testing is considered an integral part of a complete examination. Limited examinations for reoccurring indications may be performed as noted. The reflux portion of the exam is performed with the patient in reverse Trendelenburg.  +---------+---------------+---------+-----------+----------+--------------+ RIGHT    CompressibilityPhasicitySpontaneityPropertiesThrombus Aging +---------+---------------+---------+-----------+----------+--------------+ CFV      Full           Yes      Yes                                 +---------+---------------+---------+-----------+----------+--------------+  SFJ      Full                                                        +---------+---------------+---------+-----------+----------+--------------+ FV Prox  Full                                                        +---------+---------------+---------+-----------+----------+--------------+ FV Mid   Full                                                        +---------+---------------+---------+-----------+----------+--------------+ FV DistalFull                                                        +---------+---------------+---------+-----------+----------+--------------+ PFV      Full                                                        +---------+---------------+---------+-----------+----------+--------------+ POP      Full           Yes      Yes                                 +---------+---------------+---------+-----------+----------+--------------+ PTV      Full                                                        +---------+---------------+---------+-----------+----------+--------------+ PERO     Full                                                         +---------+---------------+---------+-----------+----------+--------------+   +---------+---------------+---------+-----------+----------+--------------+ LEFT     CompressibilityPhasicitySpontaneityPropertiesThrombus Aging +---------+---------------+---------+-----------+----------+--------------+ CFV      Full           Yes      Yes                                 +---------+---------------+---------+-----------+----------+--------------+ SFJ      Full                                                        +---------+---------------+---------+-----------+----------+--------------+  FV Prox  Full                                                        +---------+---------------+---------+-----------+----------+--------------+ FV Mid   Full                                                        +---------+---------------+---------+-----------+----------+--------------+ FV DistalFull                                                        +---------+---------------+---------+-----------+----------+--------------+ PFV      Full                                                        +---------+---------------+---------+-----------+----------+--------------+ POP      Full           Yes      Yes                                 +---------+---------------+---------+-----------+----------+--------------+ PTV      Full                                                        +---------+---------------+---------+-----------+----------+--------------+ PERO     Full                                                        +---------+---------------+---------+-----------+----------+--------------+     Summary: BILATERAL: - No evidence of deep vein thrombosis seen in the lower extremities, bilaterally. -No evidence of popliteal cyst, bilaterally.   *See table(s) above for measurements and observations. Electronically  signed by Lemar Livings MD on 08/30/2023 at 11:24:35 AM.    Final    DG CHEST PORT 1 VIEW  Result Date: 08/30/2023 CLINICAL DATA:  141880 SOB (shortness of breath) [id number] EXAM: PORTABLE CHEST - 1 VIEW COMPARISON:  08/29/2023. FINDINGS: Cardiac silhouette is unremarkable. No pneumothorax or pleural effusion. Stable linear subsegmental atelectasis or scarring right upper lung. Otherwise lungs are clear. The visualized skeletal structures are unremarkable. IMPRESSION: Stable right upper lung subsegmental atelectasis or scarring. Otherwise no acute cardiopulmonary process. Electronically Signed   By: Layla Maw M.D.   On: 08/30/2023 08:16   DG CHEST PORT 1 VIEW  Result Date: 08/29/2023 CLINICAL DATA:  Shortness of breath. EXAM: PORTABLE CHEST 1 VIEW COMPARISON:  August 28, 2023 FINDINGS: Cardiomediastinal silhouette is normal. Mediastinal contours appear intact. Subtle residual linear  airspace consolidation in the right upper thorax. No pneumothorax. Left lung is clear. Osseous structures are without acute abnormality. Soft tissues are grossly normal. IMPRESSION: Subtle residual linear airspace consolidation in the right upper thorax. Electronically Signed   By: Ted Mcalpine M.D.   On: 08/29/2023 14:44   CT Angio Chest PE W and/or Wo Contrast  Result Date: 08/28/2023 CLINICAL DATA:  Pneumonia for a month.  On antibiotics. EXAM: CT ANGIOGRAPHY CHEST WITH CONTRAST TECHNIQUE: Multidetector CT imaging of the chest was performed using the standard protocol during bolus administration of intravenous contrast. Multiplanar CT image reconstructions and MIPs were obtained to evaluate the vascular anatomy. RADIATION DOSE REDUCTION: This exam was performed according to the departmental dose-optimization program which includes automated exposure control, adjustment of the mA and/or kV according to patient size and/or use of iterative reconstruction technique. CONTRAST:  75mL OMNIPAQUE IOHEXOL 350 MG/ML  SOLN COMPARISON:  Plain film of earlier today chest CT 05/06/2022 FINDINGS: Cardiovascular: The quality of this exam for evaluation of pulmonary embolism is moderate. The bolus is centered in the SVC. No pulmonary embolism to the large segmental level. Normal aortic caliber. Normal heart size, without pericardial effusion. Mild LAD coronary artery calcification. Mediastinum/Nodes: No mediastinal or hilar adenopathy. Lungs/Pleura: No pleural fluid. Patent airways, including to the right upper lobe. Posterior right upper lobe airspace and less so ground-glass opacity is new since the prior CT. Upper Abdomen: Normal imaged portions of the liver, spleen, stomach, pancreas, gallbladder, adrenal glands, kidneys. Musculoskeletal: Superior endplate irregularity and sclerosis at T12 is most likely degenerative versus less likely related to remote compression fracture. Review of the MIP images confirms the above findings. Mild S shaped thoracolumbar spine curvature. IMPRESSION: 1. No pulmonary embolism with mild limitations as detailed above. 2. Posterior right upper lobe airspace and ground-glass opacity consistent with pneumonia. No centrally obstructive mass identified. 3. Mild coronary artery calcification. Electronically Signed   By: Jeronimo Greaves M.D.   On: 08/28/2023 17:20   DG Chest Port 1 View  Result Date: 08/28/2023 CLINICAL DATA:  Recent history of pneumonia. EXAM: PORTABLE CHEST 1 VIEW COMPARISON:  Chest radiograph dated August 18, 2023. FINDINGS: The heart size and mediastinal contours are within normal limits. Persistent slightly decreased confluent airspace opacity in the right upper lobe. No new focal consolidation. No pleural effusion or pneumothorax. No acute osseous abnormality. IMPRESSION: Persistent slightly decreased confluent airspace opacity in the right upper lobe is concerning for pneumonia. Followup PA and lateral chest X-ray is recommended in 6-8 weeks to ensure resolution and exclude  underlying malignancy Electronically Signed   By: Hart Robinsons M.D.   On: 08/28/2023 16:08   DG Chest 2 View  Result Date: 08/18/2023 CLINICAL DATA:  Cough and mild shortness of breath, recent pneumonia. EXAM: CHEST - 2 VIEW COMPARISON:  Chest radiograph 03/20/2022 FINDINGS: The cardiomediastinal silhouette is normal. There is confluent airspace opacity in the right upper lobe. There is no other focal airspace opacity. There is no pulmonary edema. There is no pleural effusion or pneumothorax There is no acute osseous abnormality. IMPRESSION: Confluent airspace opacity in the right upper lobe concerning for persistent pneumonia. Recommend follow-up radiographs in 6-8 weeks to ensure resolution. If the opacity persists, CT chest may be needed to exclude neoplasm. Electronically Signed   By: Lesia Hausen M.D.   On: 08/18/2023 13:48    Microbiology: Results for orders placed or performed during the hospital encounter of 08/28/23  Blood Culture (routine x 2)     Status:  None (Preliminary result)   Collection Time: 08/28/23 11:45 AM   Specimen: BLOOD  Result Value Ref Range Status   Specimen Description   Final    BLOOD SITE NOT SPECIFIED Performed at [facility], [address] [place], [place] [address]    Special Requests   Final    BOTTLES DRAWN AEROBIC AND ANAEROBIC Blood Culture adequate volume Performed at [facility], [address] [place], [place] [address]    Culture   Final    NO GROWTH 2 DAYS Performed at [facility], [address] [place], [place] [address]    Report Status PENDING  Incomplete  Blood Culture (routine x 2)     Status: None (Preliminary result)   Collection Time: 08/28/23 12:15 PM   Specimen: BLOOD LEFT HAND  Result Value Ref Range Status   Specimen Description   Final    BLOOD LEFT HAND Performed at [facility], [address], [place], [place] [address]    Special Requests   Final    BOTTLES DRAWN  AEROBIC AND ANAEROBIC Blood Culture adequate volume Performed at [facility], [address], [place], [place] [address]    Culture   Final    NO GROWTH 2 DAYS Performed at [facility], [address], [place], [place] [address]    Report Status PENDING  Incomplete  SARS Coronavirus 2 by RT PCR (hospital order, performed in [facility] hospital lab) *cepheid single result test* Anterior Nasal Swab     Status: None   Collection Time: 08/28/23  8:46 PM   Specimen: Anterior Nasal Swab  Result Value Ref Range Status   SARS Coronavirus 2 by RT PCR NEGATIVE NEGATIVE Final    Comment: (NOTE) SARS-CoV-2 target nucleic acids are NOT DETECTED.  The SARS-CoV-2 RNA is generally detectable in upper and lower respiratory specimens during the acute phase of infection. The lowest concentration of SARS-CoV-2 viral copies this assay can detect is 250 copies / mL. A negative result does not preclude SARS-CoV-2 infection and should not be used as the sole basis for treatment or other patient management decisions.  A negative result may occur with improper specimen collection / handling, submission of specimen other than nasopharyngeal swab, presence of viral mutation(s) within the areas targeted by this assay, and inadequate number of viral copies (<250 copies / mL). A negative result must be combined with clinical observations, patient history, and epidemiological information.  Fact Sheet for Patients:   RoadLapTop.co.za  Fact Sheet for Healthcare Providers: http://kim-miller.com/  This test is not yet approved or  cleared by the [place] FDA and has been authorized for detection and/or diagnosis of SARS-CoV-2 by FDA under an Emergency Use Authorization (EUA).  This EUA will remain in effect (meaning this test can be used) for the duration of the COVID-19 declaration under Section 564(b)(1) of the Act, 21 U.S.C. section  360bbb-3(b)(1), unless the authorization is terminated or revoked sooner.  Performed at [facility], [address], [place], [place] [address]   Respiratory (~20 pathogens) panel by PCR     Status: None   Collection Time: 08/28/23  8:46 PM   Specimen: Anterior Nasal Swab; Respiratory  Result Value Ref Range Status   Adenovirus NOT DETECTED NOT DETECTED Final   Coronavirus 229E NOT DETECTED NOT DETECTED Final    Comment: (NOTE) The Coronavirus on the Respiratory Panel, DOES NOT test for the novel  Coronavirus (2019  nCoV)    Coronavirus HKU1 NOT DETECTED NOT DETECTED Final   Coronavirus NL63 NOT DETECTED NOT DETECTED Final   Coronavirus OC43 NOT DETECTED NOT DETECTED Final   Metapneumovirus NOT DETECTED NOT DETECTED Final   Rhinovirus / Enterovirus NOT DETECTED NOT DETECTED Final   Influenza A NOT DETECTED NOT DETECTED Final   Influenza B NOT DETECTED NOT DETECTED Final   Parainfluenza Virus 1 NOT DETECTED NOT DETECTED Final   Parainfluenza Virus 2 NOT DETECTED NOT DETECTED Final   Parainfluenza Virus 3 NOT DETECTED NOT DETECTED Final   Parainfluenza Virus 4 NOT DETECTED NOT DETECTED Final   Respiratory Syncytial Virus NOT DETECTED NOT DETECTED Final   Bordetella pertussis NOT DETECTED NOT DETECTED Final   Bordetella Parapertussis NOT DETECTED NOT DETECTED Final   Chlamydophila pneumoniae NOT DETECTED NOT DETECTED Final   Mycoplasma pneumoniae NOT DETECTED NOT DETECTED Final    Comment: Performed at [facility], [address], [place], [place] [address]  Expectorated Sputum Assessment w Gram Stain, Rflx to Resp Cult     Status: None   Collection Time: 08/30/23  8:30 AM   Specimen: Sputum  Result Value Ref Range Status   Specimen Description SPUTUM  Final   Special Requests NONE  Final   Sputum evaluation   Final    Sputum specimen not acceptable for testing.  Please recollect.   NAVARRO B @ 1023 ON 08/30/23 CAL Performed at [facility], [address] [place], [place] [address]    Report Status 08/30/2023 FINAL  Final    Labs: CBC: Recent Labs  Lab 08/28/23 1145 08/29/23 0523 08/30/23 0423  WBC 10.7* 9.3 8.5  NEUTROABS 7.1  --  3.8  HGB 14.6 13.8 14.0  HCT 42.9 40.5 42.8  MCV 91.3 92.3 94.9  PLT 368 325 334   Basic Metabolic Panel: Recent Labs  Lab 08/28/23 1145 08/29/23 0523 08/30/23 0423  NA 136 131* 138  K 4.0 3.4* 3.7  CL 103 100 106  CO2 22 21* 22  GLUCOSE 94 99 96  BUN 9 9 8   CREATININE 0.75 0.71 0.76  CALCIUM 9.4 8.9 9.1  MG  --   --  2.2  PHOS  --   --  4.3   Liver Function Tests: Recent Labs  Lab 08/28/23 1145 08/30/23 0423  AST 26 19  ALT 23 20  ALKPHOS 70 63  BILITOT 0.8 0.9  PROT 8.3* 7.4  ALBUMIN 4.4 3.9   CBG: No results for input(s): "GLUCAP" in the last 168 hours.  Discharge time spent: {LESS THAN/GREATER WGNF:62130} 30 minutes.  Signed: Merlene Laughter, DO [facility] 08/30/2023

## 2023-08-31 ENCOUNTER — Telehealth: Payer: Self-pay

## 2023-08-31 NOTE — Transitions of Care (Post Inpatient/ED Visit) (Signed)
   08/31/2023  Name: Jonathan Luna MRN: [id number] DOB: 1966-04-18  Today's TOC FU Call Status: Today's TOC FU Call Status:: Unsuccessful Call (1st Attempt) Unsuccessful Call (1st Attempt) Date: 08/31/23  Attempted to reach the patient regarding the most recent Inpatient/ED visit.  Follow Up Plan: Additional outreach attempts will be made to reach the patient to complete the Transitions of Care (Post Inpatient/ED visit) call.   Signature Karena Addison, LPN [facility] Nurse Health Advisor Direct Dial [phone]

## 2023-08-31 NOTE — Telephone Encounter (Signed)
Can we overbook patient or schedule next available January 2025.

## 2023-08-31 NOTE — Telephone Encounter (Signed)
Duplicate. This has been sent to Dr. Yetta Barre by other means.

## 2023-08-31 NOTE — Telephone Encounter (Signed)
Can double book me on 12/16, 12/17, or 12/19

## 2023-08-31 NOTE — Telephone Encounter (Signed)
Patient scheduled 10/01/2023 at 4:00 pm with Rubye Oaks NP.

## 2023-09-01 ENCOUNTER — Encounter: Payer: Self-pay | Admitting: Internal Medicine

## 2023-09-01 ENCOUNTER — Telehealth: Payer: Self-pay | Admitting: Internal Medicine

## 2023-09-01 ENCOUNTER — Other Ambulatory Visit: Payer: Self-pay

## 2023-09-01 ENCOUNTER — Other Ambulatory Visit: Payer: Self-pay | Admitting: Internal Medicine

## 2023-09-01 LAB — LEGIONELLA PNEUMOPHILA SEROGP 1 UR AG: L. pneumophila Serogp 1 Ur Ag: NEGATIVE

## 2023-09-01 NOTE — Telephone Encounter (Signed)
Patient has been scheduled for a hospital follow up.

## 2023-09-01 NOTE — Telephone Encounter (Signed)
Patient was recently released from the hospital for pneumonia. He wanted to schedule a hospital follow up with Dr. Yetta Barre, but the next available appointment is in mid-December. Patient already has an OV scheduled for 09/28/2023. Patient would like to know if Dr. Yetta Barre can see him late next week for a hospital follow up. He declined scheduling with Deliah Boston to be seen sooner. Patient would like a call back at [phone].

## 2023-09-01 NOTE — Transitions of Care (Post Inpatient/ED Visit) (Signed)
09/01/2023  Name: Jonathan Luna MRN: [id number] DOB: 08/04/1966  Today's TOC FU Call Status: Today's TOC FU Call Status:: Successful TOC FU Call Completed Unsuccessful Call (1st Attempt) Date: 08/31/23 [facility] FU Call Complete Date: 09/01/23 Patient's Name and Date of Birth confirmed.  Transition Care Management Follow-up Telephone Call Date of Discharge: 08/30/23 Discharge Facility: [facility]) Type of Discharge: Inpatient Admission Primary Inpatient Discharge Diagnosis:: pneumonia How have you been since you were released from the hospital?: Better Any questions or concerns?: No  Items Reviewed: Did you receive and understand the discharge instructions provided?: Yes Medications obtained,verified, and reconciled?: Yes (Medications Reviewed) Any new allergies since your discharge?: No Dietary orders reviewed?: Yes Do you have support at home?: Yes People in Home: spouse  Medications Reviewed Today: Medications Reviewed Today     Reviewed by Karena Addison, LPN (Licensed Practical Nurse) on 09/01/23 at 1159  Med List Status: <None>   Medication Order Taking? Sig Documenting Provider Last Dose Status Informant  acetaminophen (TYLENOL) 325 MG tablet [id number]  Take 2 tablets (650 mg total) by mouth every 6 (six) hours as needed for mild pain (pain score 1-3) (or Fever >/= 101). Sheikh, Omair Latif, DO  Active   albuterol (VENTOLIN HFA) 108 [phone] Base) MCG/ACT inhaler [id number] No Inhale 2 puffs into the lungs every 4 (four) hours as needed. [provider] 08/28/2023 Active Self, Pharmacy Records  aspirin EC 81 MG tablet [id number] No Take 81 mg by mouth daily. Swallow whole. [provider] 08/28/2023 Active Self, Pharmacy Records  cefdinir (OMNICEF) 300 MG capsule [id number]  Take 1 capsule (300 mg total) by mouth 2 (two) times daily for 7 days. Sheikh, Omair Bancroft, DO  Active   chlorpheniramine-HYDROcodone (TUSSIONEX) 10-8 MG/5ML [id number]  Take 5 mLs by mouth  every 12 (twelve) hours as needed for cough (Refractory Cough). Sheikh, Omair Latif, DO  Active   Cholecalciferol (VITAMIN D-3) 25 MCG (1000 UT) CAPS [id number] No Take 1 capsule by mouth daily. [provider] 08/28/2023 Active Self, Pharmacy Records  clonazePAM (KLONOPIN) 0.5 MG tablet [id number] No Take 1 tablet (0.5 mg total) by mouth 2 (two) times daily as needed for anxiety. [name], MD 08/28/2023 Active Self, Pharmacy Records  desvenlafaxine (PRISTIQ) 25 MG 24 hr tablet [id number] No Take 1 tablet (25 mg total) by mouth daily.  Patient not taking: Reported on 08/28/2023   Gwenlyn Fudge, FNP Not Taking Active Self, Pharmacy Records  doxycycline (VIBRA-TABS) 100 MG tablet [id number]  Take 1 tablet (100 mg total) by mouth 2 (two) times daily for 7 days. Sheikh, Omair Garden, DO  Active   fluticasone [facility]) 50 MCG/ACT nasal spray [id number] No Place 2 sprays into both nostrils daily. [name], MD unknown Active Self, Pharmacy Records           Med Note [facility] [place], [place] A   Fri Aug 28, 2023  7:47 PM) Pt has not started using this medication.  gabapentin (NEURONTIN) 100 MG capsule [id number]  Take 1 capsule (100 mg total) by mouth 3 (three) times daily. Sheikh, Omair Latif, DO  Active   guaiFENesin (MUCINEX) 600 MG 12 hr tablet [id number]  Take 1 tablet (600 mg total) by mouth 2 (two) times daily for 5 days. Sheikh, Omair Latif, DO  Active   indapamide (LOZOL) 1.25 MG tablet [id number] No Take 1 tablet (1.25 mg total) by mouth daily. [name], MD 08/28/2023 Active Self, Pharmacy Records  irbesartan (AVAPRO) 300 MG tablet [id number]  No Take 1 tablet (300 mg total) by mouth daily. [name], MD 08/28/2023 Active Self, Pharmacy Records  levothyroxine (SYNTHROID) 150 MCG tablet [id number] No TAKE ONE TABLET BY MOUTH ONCE A DAY BEFORE [place] [name], MD 08/28/2023 Active Self, Pharmacy Records  Multiple Vitamins-Minerals (ZINC PO) [id number] No Take by  mouth. [provider] 08/27/2023 Active Self, Pharmacy Records  omega-3 acid ethyl esters (LOVAZA) 1 g capsule [id number] No TAKE TWO CAPSULES BY MOUTH TWICE DAILY  Patient not taking: Reported on 08/10/2023   [name], MD Not Taking Active Self, Pharmacy Records  oxyCODONE-acetaminophen (PERCOCET/ROXICET) 5-325 MG tablet [id number] No Take 1 tablet by mouth every 8 (eight) hours as needed for severe pain (pain score 7-10). [name], MD 08/28/2023 Active Self, Pharmacy Records  polyethylene glycol (MIRALAX / GLYCOLAX) 17 g packet [id number]  Take 17 g by mouth daily as needed for mild constipation. Marguerita Merles [place], DO  Active             Home Care and Equipment/Supplies: Were Home Health Services Ordered?: NA Any new equipment or medical supplies ordered?: NA  Functional Questionnaire: Do you need assistance with bathing/showering or dressing?: No Do you need assistance with meal preparation?: No Do you need assistance with eating?: No Do you have difficulty maintaining continence: No Do you need assistance with getting out of bed/getting out of a chair/moving?: No Do you have difficulty managing or taking your medications?: No  Follow up appointments reviewed: PCP Follow-up appointment confirmed?: Yes Date of PCP follow-up appointment?: 09/04/23 Follow-up Provider: [facility] Follow-up appointment confirmed?: NA Do you need transportation to your follow-up appointment?: No Do you understand care options if your condition(s) worsen?: Yes-patient verbalized understanding    SIGNATURE Karena Addison, LPN [facility] Nurse Health Advisor Direct Dial [phone]

## 2023-09-02 LAB — CULTURE, BLOOD (ROUTINE X 2)
Culture: NO GROWTH
Culture: NO GROWTH
Special Requests: ADEQUATE
Special Requests: ADEQUATE

## 2023-09-04 ENCOUNTER — Ambulatory Visit: Payer: 59 | Admitting: Internal Medicine

## 2023-09-04 ENCOUNTER — Telehealth: Payer: Self-pay | Admitting: *Deleted

## 2023-09-04 ENCOUNTER — Encounter: Payer: Self-pay | Admitting: Internal Medicine

## 2023-09-04 VITALS — BP 138/86 | HR 123 | Temp 98.4°F | Ht 67.0 in | Wt 189.6 lb

## 2023-09-04 DIAGNOSIS — R000 Tachycardia, unspecified: Secondary | ICD-10-CM | POA: Diagnosis not present

## 2023-09-04 DIAGNOSIS — R0609 Other forms of dyspnea: Secondary | ICD-10-CM | POA: Insufficient documentation

## 2023-09-04 DIAGNOSIS — E039 Hypothyroidism, unspecified: Secondary | ICD-10-CM

## 2023-09-04 DIAGNOSIS — R531 Weakness: Secondary | ICD-10-CM | POA: Insufficient documentation

## 2023-09-04 DIAGNOSIS — I10 Essential (primary) hypertension: Secondary | ICD-10-CM

## 2023-09-04 DIAGNOSIS — M51369 Other intervertebral disc degeneration, lumbar region without mention of lumbar back pain or lower extremity pain: Secondary | ICD-10-CM

## 2023-09-04 DIAGNOSIS — M5030 Other cervical disc degeneration, unspecified cervical region: Secondary | ICD-10-CM

## 2023-09-04 DIAGNOSIS — G629 Polyneuropathy, unspecified: Secondary | ICD-10-CM

## 2023-09-04 DIAGNOSIS — R251 Tremor, unspecified: Secondary | ICD-10-CM | POA: Diagnosis not present

## 2023-09-04 DIAGNOSIS — M545 Low back pain, unspecified: Secondary | ICD-10-CM

## 2023-09-04 DIAGNOSIS — G894 Chronic pain syndrome: Secondary | ICD-10-CM

## 2023-09-04 DIAGNOSIS — F410 Panic disorder [episodic paroxysmal anxiety] without agoraphobia: Secondary | ICD-10-CM

## 2023-09-04 LAB — FOLATE: Folate: 15.3 ng/mL (ref >5.9)

## 2023-09-04 LAB — TROPONIN I (HIGH SENSITIVITY): High Sens Troponin I: 7 ng/L (ref 2–17)

## 2023-09-04 LAB — BRAIN NATRIURETIC PEPTIDE: Pro B Natriuretic peptide (BNP): 6 pg/mL (ref 0.0–100.0)

## 2023-09-04 MED ORDER — SERTRALINE HCL 25 MG PO TABS: 25.0000 mg | ORAL_TABLET | Freq: Every day | ORAL | 0 refills | Status: DC

## 2023-09-04 MED ORDER — IRBESARTAN 300 MG PO TABS: 300.0000 mg | ORAL_TABLET | Freq: Every day | ORAL | 0 refills | Status: DC

## 2023-09-04 MED ORDER — OXYCODONE-ACETAMINOPHEN 5-325 MG PO TABS: 1.0000 | ORAL_TABLET | Freq: Three times a day (TID) | ORAL | 0 refills | Status: DC | PRN

## 2023-09-04 MED ORDER — UNITHROID 125 MCG PO TABS: 125.0000 ug | ORAL_TABLET | Freq: Every day | ORAL | 0 refills | Status: DC

## 2023-09-04 MED ORDER — METOPROLOL SUCCINATE ER 25 MG PO TB24: 25.0000 mg | ORAL_TABLET | Freq: Every day | ORAL | 0 refills | Status: DC

## 2023-09-04 NOTE — Patient Instructions (Signed)
Community-Acquired Pneumonia, Adult  Pneumonia is a lung infection that causes inflammation and the buildup of mucus and fluids in the lungs. This may cause coughing and difficulty breathing. Community-acquired pneumonia is pneumonia that develops in people who are not, and have not recently been, in a hospital or other health care facility.  Usually, pneumonia develops as a result of an illness that is caused by a virus, such as the common cold and the flu (influenza). It can also be caused by bacteria or fungi. While the common cold and influenza can pass from person to person (are contagious), pneumonia itself is not considered contagious.  What are the causes?    This condition may be caused by:  Viruses.  Bacteria.  Fungi.  What increases the risk?  The following factors may make you more likely to develop this condition:  Being over age 54 or having certain medical conditions, such as:  A long-term (chronic) disease, such as: chronic obstructive pulmonary disease (COPD), asthma, heart failure, diabetes, or kidney disease.  A condition that increases the risk of breathing in (aspirating) mucus and other fluids from your mouth and nose.  A weakened body defense system (immune system).  Having had your spleen removed (splenectomy). The spleen is the organ that helps fight germs and infections.  Not cleaning your teeth and gums well (poor dental hygiene).  Using tobacco products.  Traveling to places where germs that cause pneumonia are present or being near certain animals or animal habitats that could have germs that cause pneumonia.  What are the signs or symptoms?  Symptoms of this condition include:  A dry cough or a wet (productive) cough.  A fever, sweating, or chills.  Chest pain, especially when breathing deeply or coughing.  Fast breathing, difficulty breathing, or shortness of breath.  Tiredness (fatigue) and muscle aches.  How is this diagnosed?    This condition may be diagnosed based on your medical  history or a physical exam. You may also have tests, including:  Imaging, such as a chest X-ray or lung ultrasound.  Tests of:  The level of oxygen and other gases in your blood.  Mucus from your lungs (sputum).  Fluid around your lungs (pleural fluid).  Your urine.  How is this treated?  Treatment for this condition depends on many factors, such as the cause of your pneumonia, your medicines, and other medical conditions that you have.  For most adults, pneumonia may be treated at home. In some cases, treatment must happen in a hospital and may include:  Medicines that are given by mouth (orally) or through an IV, including:  Antibiotic medicines, if bacteria caused the pneumonia.  Medicines that kill viruses (antiviral medicines), if a virus caused the pneumonia.  Oxygen therapy.  Severe pneumonia, although rare, may require the following treatments:  Mechanical ventilation.This procedure uses a machine to help you breathe if you cannot breathe well on your own or maintain a safe level of blood oxygen.  Thoracentesis. This procedure removes any buildup of pleural fluid to help with breathing.  Follow these instructions at home:    Medicines  Take over-the-counter and prescription medicines only as told by your health care provider.  Take cough medicine only if you have trouble sleeping. Cough medicine can prevent your body from removing mucus from your lungs.  If you were prescribed antibiotics, take them as told by your health care provider. Do not stop taking the antibiotic even if you start to feel better.  Lifestyle  Do not drink alcohol.  Do not use any products that contain nicotine or tobacco. These products include cigarettes, chewing tobacco, and vaping devices, such as e-cigarettes. If you need help quitting, ask your health care provider.  Eat a healthy diet. This includes plenty of vegetables, fruits, whole grains, low-fat dairy products, and lean protein.  General instructions  Rest a lot and  get at least 8 hours of sleep each night.  Sleep in a partly upright position at night. Place a few pillows under your head or sleep in a reclining chair.  Return to your normal activities as told by your health care provider. Ask your health care provider what activities are safe for you.  Drink enough fluid to keep your urine pale yellow. This helps to thin the mucus in your lungs.  If your throat is sore, gargle with a mixture of salt and water 3-4 times a day or as needed. To make salt water, completely dissolve -1 tsp (3-6 g) of salt in 1 cup (237 mL) of warm water.  Keep all follow-up visits.  How is this prevented?  You can lower your risk of developing community-acquired pneumonia by:  Getting the pneumonia vaccine. There are different types and schedules of pneumonia vaccines. Ask your health care provider which option is best for you. Consider getting the pneumonia vaccine if:  You are older than 57 years of age.  You are 39-51 years of age and are receiving cancer treatment, have chronic lung disease, or have other medical conditions that affect your immune system. Ask your health care provider if this applies to you.  Getting your influenza vaccine every year. Ask your health care provider which type of vaccine is best for you.  Getting regular dental checkups.  Washing your hands often with soap and water for at least 20 seconds. If soap and water are not available, use hand sanitizer.  Contact a health care provider if:  You have a fever.  You have trouble sleeping because you cannot control your cough with cough medicine.  Get help right away if:  Your shortness of breath becomes worse.  Your chest pain increases.  Your sickness becomes worse, especially if you are an older adult or have a weak immune system.  You cough up blood.  These symptoms may be an emergency. Get help right away. Call 911.  Do not wait to see if the symptoms will go away.  Do not drive yourself to the  hospital.  Summary  Pneumonia is an infection of the lungs.  Community-acquired pneumonia develops in people who have not been in the hospital. It can be caused by bacteria, viruses, or fungi.  This condition may be treated with antibiotics or antiviral medicines.  Severe pneumonia may require a hospital stay and treatment to help with breathing.  This information is not intended to replace advice given to you by your health care provider. Make sure you discuss any questions you have with your health care provider.  Document Revised: 11/27/2021 Document Reviewed: 11/27/2021  Elsevier Patient Education  2024 [employer].

## 2023-09-04 NOTE — Progress Notes (Unsigned)
  Care Coordination  Outreach Note  09/04/2023 Name: Jonathan Luna MRN: [id number] DOB: August 02, 1966   Care Coordination Outreach Attempts: An unsuccessful telephone outreach was attempted today to offer the patient information about available care coordination services.  Follow Up Plan:  Additional outreach attempts will be made to offer the patient care coordination information and services.   Encounter Outcome:  No Answer  Burman Nieves, CCMA Care Coordination Care Guide Direct Dial: [phone]

## 2023-09-04 NOTE — Progress Notes (Unsigned)
Subjective:  Patient ID: Jonathan Luna, male    DOB: 10-09-1966  Age: 57 y.o. MRN: [id number]  CC: Hypertension   HPI ASHVIN DOHNER presents for f/up ----  Discussed the use of AI scribe software for clinical note transcription with the patient, who gave verbal consent to proceed.  History of Present Illness   The patient presents with progressive exertional dyspnea and fatigue, which has significantly impacted his daily activities, such as showering and dressing. He describes a sensation of weakness and tremors, particularly in the right hand, which worsens with resistance. The patient also reports difficulty in controlling his descent when sitting, often resulting in a 'collapse' onto the toilet seat.  The patient denies chest pain but notes an elevated heart rate with exertion, which he attributes to his overall weakness and fatigue. He denies experiencing cold sweats. He also reports episodes of dizziness and visual disturbances when rising too quickly, which he has managed by adopting a slower, more cautious approach to standing.  The patient's recent hospitalization for pneumonia appears to have improved, with ongoing antibiotic therapy. He reports a persistent cough, which is worse at night and exacerbated by exertion. He also notes numbness in both hands, which began in the right hand and subsequently affected the left.  The patient also reports neck pain, which extends to the mid-back, and headaches when attempting to raise his head while lying down. He has previously attended physical therapy for this issue, but reports no improvement.  The patient's current medication regimen includes an inhaler (Albuterol) used at night to manage potential coughing episodes. He has previously been prescribed Pristiq as an antidepressant, but is not currently taking it. He also reports a recent change in bowel habits, with softer stools, but denies cramping, blood, or mucus.  The patient expresses  concern about his ability to perform his job due to his symptoms, particularly the tremors and elevated heart rate with exertion. He also expresses anxiety about his current health status.       Outpatient Medications Prior to Visit  Medication Sig Dispense Refill   albuterol (VENTOLIN HFA) 108 (90 Base) MCG/ACT inhaler Inhale 2 puffs into the lungs every 4 (four) hours as needed.     aspirin EC 81 MG tablet Take 81 mg by mouth daily. Swallow whole.     cefdinir (OMNICEF) 300 MG capsule Take 1 capsule (300 mg total) by mouth 2 (two) times daily for 7 days. 14 capsule 0   Cholecalciferol (VITAMIN D-3) 25 MCG (1000 UT) CAPS Take 1 capsule by mouth daily.     clonazePAM (KLONOPIN) 0.5 MG tablet Take 1 tablet (0.5 mg total) by mouth 2 (two) times daily as needed for anxiety. 60 tablet 0   doxycycline (VIBRA-TABS) 100 MG tablet Take 1 tablet (100 mg total) by mouth 2 (two) times daily for 7 days. 14 tablet 0   indapamide (LOZOL) 1.25 MG tablet Take 1 tablet (1.25 mg total) by mouth daily. 90 tablet 0   Multiple Vitamins-Minerals (ZINC PO) Take by mouth.     irbesartan (AVAPRO) 300 MG tablet Take 1 tablet (300 mg total) by mouth daily. 90 tablet 0   levothyroxine (SYNTHROID) 150 MCG tablet TAKE ONE TABLET BY MOUTH ONCE A DAY BEFORE BREAKFAST 90 tablet 0   oxyCODONE-acetaminophen (PERCOCET/ROXICET) 5-325 MG tablet Take 1 tablet by mouth every 8 (eight) hours as needed for severe pain (pain score 7-10). 90 tablet 0   fluticasone (FLONASE) 50 MCG/ACT nasal spray Place 2 sprays  into both nostrils daily. (Patient not taking: Reported on 09/04/2023) 48 g 1   acetaminophen (TYLENOL) 325 MG tablet Take 2 tablets (650 mg total) by mouth every 6 (six) hours as needed for mild pain (pain score 1-3) (or Fever >/= 101). 20 tablet 0   chlorpheniramine-HYDROcodone (TUSSIONEX) 10-8 MG/5ML Take 5 mLs by mouth every 12 (twelve) hours as needed for cough (Refractory Cough). 70 mL 0   desvenlafaxine (PRISTIQ) 25 MG 24 hr  tablet Take 1 tablet (25 mg total) by mouth daily. (Patient not taking: Reported on 08/28/2023) 30 tablet 2   gabapentin (NEURONTIN) 100 MG capsule Take 1 capsule (100 mg total) by mouth 3 (three) times daily. 90 capsule 0   guaiFENesin (MUCINEX) 600 MG 12 hr tablet Take 1 tablet (600 mg total) by mouth 2 (two) times daily for 5 days. 10 tablet 0   omega-3 acid ethyl esters (LOVAZA) 1 g capsule TAKE TWO CAPSULES BY MOUTH TWICE DAILY (Patient not taking: Reported on 08/10/2023) 360 capsule 0   polyethylene glycol (MIRALAX / GLYCOLAX) 17 g packet Take 17 g by mouth daily as needed for mild constipation. 14 each 0   No facility-administered medications prior to visit.    ROS Review of Systems  Constitutional:  Positive for fatigue. Negative for activity change, appetite change, chills, diaphoresis, fever and unexpected weight change.  HENT: Negative.    Eyes:  Positive for visual disturbance.  Respiratory:  Positive for cough and shortness of breath. Negative for chest tightness and wheezing.   Cardiovascular:  Negative for chest pain, palpitations and leg swelling.  Gastrointestinal:  Negative for abdominal pain, blood in stool, constipation, diarrhea, nausea and vomiting.  Genitourinary: Negative.  Negative for difficulty urinating.  Musculoskeletal:  Positive for back pain and neck pain. Negative for joint swelling and myalgias.  Skin: Negative.   Neurological:  Positive for weakness and numbness. Negative for dizziness, syncope, facial asymmetry and light-headedness.  Hematological:  Negative for adenopathy. Does not bruise/bleed easily.  Psychiatric/Behavioral:  Positive for confusion, decreased concentration, dysphoric mood and sleep disturbance. Negative for suicidal ideas. The patient is nervous/anxious.     Objective:  BP 138/86 (BP Location: Left Arm, Patient Position: Sitting, Cuff Size: Normal)   Pulse (!) 123   Temp 98.4 F (36.9 C) (Oral)   Ht 5\' 7"  (1.702 m)   Wt 189 lb 9.6  oz (86 kg)   SpO2 96%   BMI 29.70 kg/m   BP Readings from Last 3 Encounters:  09/04/23 138/86  08/30/23 (!) 149/95  08/28/23 (!) 150/98    Wt Readings from Last 3 Encounters:  09/04/23 189 lb 9.6 oz (86 kg)  08/29/23 192 lb 3.9 oz (87.2 kg)  08/28/23 192 lb 3.2 oz (87.2 kg)    Physical Exam Vitals reviewed.  Constitutional:      General: He is not in acute distress.    Appearance: He is ill-appearing. He is not toxic-appearing or diaphoretic.  HENT:     Mouth/Throat:     Mouth: Mucous membranes are moist.  Eyes:     General: No scleral icterus.    Conjunctiva/sclera: Conjunctivae normal.  Cardiovascular:     Rate and Rhythm: Regular rhythm. Tachycardia present.     Pulses: Normal pulses.     Heart sounds: No murmur heard.    No friction rub. No gallop.  Pulmonary:     Effort: Pulmonary effort is normal.     Breath sounds: No stridor. No wheezing, rhonchi or rales.  Abdominal:  General: Abdomen is flat.     Palpations: There is no mass.     Tenderness: There is no abdominal tenderness. There is no guarding.     Hernia: No hernia is present.  Musculoskeletal:        General: Normal range of motion.     Cervical back: Neck supple.     Right lower leg: No edema.     Left lower leg: No edema.  Skin:    General: Skin is warm and dry.  Neurological:     General: No focal deficit present.     Mental Status: Mental status is at baseline.  Psychiatric:        Attention and Perception: He is inattentive.        Mood and Affect: Mood is anxious and depressed.        Speech: Speech is delayed and tangential.        Behavior: Behavior normal.        Thought Content: Thought content normal. Thought content is not paranoid or delusional. Thought content does not include homicidal or suicidal ideation.        Cognition and Memory: Cognition normal.     Lab Results  Component Value Date   WBC 8.5 08/30/2023   HGB 14.0 08/30/2023   HCT 42.8 08/30/2023   PLT 334  08/30/2023   GLUCOSE 96 08/30/2023   CHOL 178 01/26/2023   TRIG 170.0 (H) 01/26/2023   HDL 40.00 01/26/2023   LDLDIRECT 150.0 04/07/2022   LDLCALC 104 (H) 01/26/2023   ALT 20 08/30/2023   AST 19 08/30/2023   NA 138 08/30/2023   K 3.7 08/30/2023   CL 106 08/30/2023   CREATININE 0.76 08/30/2023   BUN 8 08/30/2023   CO2 22 08/30/2023   TSH 0.826 08/29/2023   PSA 1.99 10/27/2022   INR 1.0 08/29/2023   HGBA1C 5.5 10/27/2022    VAS [name] LOWER EXTREMITY VENOUS (DVT)  Result Date: 08/30/2023  Lower Venous DVT Study Patient Name:  JAHVIER WALSTON  Date of Exam:   08/29/2023 Medical Rec #: [id number]   Accession #:    [id number] Date of Birth: [birth year]   Patient Gender: M Patient Age:   68 years Exam Location:  [facility] Procedure:      VAS [name] LOWER EXTREMITY VENOUS (DVT) Referring Phys: [facility] GOEL --------------------------------------------------------------------------------  Indications: Swelling, and Edema.  Risk Factors: None identified. Comparison Study: No prior study Performing Technologist: Shona Simpson  Examination Guidelines: A complete evaluation includes B-mode imaging, spectral Doppler, color Doppler, and power Doppler as needed of all accessible portions of each vessel. Bilateral testing is considered an integral part of a complete examination. Limited examinations for reoccurring indications may be performed as noted. The reflux portion of the exam is performed with the patient in reverse Trendelenburg.  +---------+---------------+---------+-----------+----------+--------------+ RIGHT    CompressibilityPhasicitySpontaneityPropertiesThrombus Aging +---------+---------------+---------+-----------+----------+--------------+ CFV      Full           Yes      Yes                                 +---------+---------------+---------+-----------+----------+--------------+ SFJ      Full                                                         +---------+---------------+---------+-----------+----------+--------------+  FV Prox  Full                                                        +---------+---------------+---------+-----------+----------+--------------+ FV Mid   Full                                                        +---------+---------------+---------+-----------+----------+--------------+ FV DistalFull                                                        +---------+---------------+---------+-----------+----------+--------------+ PFV      Full                                                        +---------+---------------+---------+-----------+----------+--------------+ POP      Full           Yes      Yes                                 +---------+---------------+---------+-----------+----------+--------------+ PTV      Full                                                        +---------+---------------+---------+-----------+----------+--------------+ PERO     Full                                                        +---------+---------------+---------+-----------+----------+--------------+   +---------+---------------+---------+-----------+----------+--------------+ LEFT     CompressibilityPhasicitySpontaneityPropertiesThrombus Aging +---------+---------------+---------+-----------+----------+--------------+ CFV      Full           Yes      Yes                                 +---------+---------------+---------+-----------+----------+--------------+ SFJ      Full                                                        +---------+---------------+---------+-----------+----------+--------------+ FV Prox  Full                                                        +---------+---------------+---------+-----------+----------+--------------+  FV Mid   Full                                                         +---------+---------------+---------+-----------+----------+--------------+ FV DistalFull                                                        +---------+---------------+---------+-----------+----------+--------------+ PFV      Full                                                        +---------+---------------+---------+-----------+----------+--------------+ POP      Full           Yes      Yes                                 +---------+---------------+---------+-----------+----------+--------------+ PTV      Full                                                        +---------+---------------+---------+-----------+----------+--------------+ PERO     Full                                                        +---------+---------------+---------+-----------+----------+--------------+     Summary: BILATERAL: - No evidence of deep vein thrombosis seen in the lower extremities, bilaterally. -No evidence of popliteal cyst, bilaterally.   *See table(s) above for measurements and observations. Electronically signed by Lemar Livings MD on 08/30/2023 at 11:24:35 AM.    Final    DG CHEST PORT 1 VIEW  Result Date: 08/29/2023 CLINICAL DATA:  Shortness of breath. EXAM: PORTABLE CHEST 1 VIEW COMPARISON:  August 28, 2023 FINDINGS: Cardiomediastinal silhouette is normal. Mediastinal contours appear intact. Subtle residual linear airspace consolidation in the right upper thorax. No pneumothorax. Left lung is clear. Osseous structures are without acute abnormality. Soft tissues are grossly normal. IMPRESSION: Subtle residual linear airspace consolidation in the right upper thorax. Electronically Signed   By: Ted Mcalpine M.D.   On: 08/29/2023 14:44   CT Angio Chest PE W and/or Wo Contrast  Result Date: 08/28/2023 CLINICAL DATA:  Pneumonia for a month.  On antibiotics. EXAM: CT ANGIOGRAPHY CHEST WITH CONTRAST TECHNIQUE: Multidetector CT imaging of the chest was performed  using the standard protocol during bolus administration of intravenous contrast. Multiplanar CT image reconstructions and MIPs were obtained to evaluate the vascular anatomy. RADIATION DOSE REDUCTION: This exam was performed according to the departmental dose-optimization program which includes automated exposure control, adjustment of the mA and/or kV according to patient size and/or use of iterative reconstruction technique.  CONTRAST:  75mL OMNIPAQUE IOHEXOL 350 MG/ML SOLN COMPARISON:  Plain film of earlier today chest CT 05/06/2022 FINDINGS: Cardiovascular: The quality of this exam for evaluation of pulmonary embolism is moderate. The bolus is centered in the SVC. No pulmonary embolism to the large segmental level. Normal aortic caliber. Normal heart size, without pericardial effusion. Mild LAD coronary artery calcification. Mediastinum/Nodes: No mediastinal or hilar adenopathy. Lungs/Pleura: No pleural fluid. Patent airways, including to the right upper lobe. Posterior right upper lobe airspace and less so ground-glass opacity is new since the prior CT. Upper Abdomen: Normal imaged portions of the liver, spleen, stomach, pancreas, gallbladder, adrenal glands, kidneys. Musculoskeletal: Superior endplate irregularity and sclerosis at T12 is most likely degenerative versus less likely related to remote compression fracture. Review of the MIP images confirms the above findings. Mild S shaped thoracolumbar spine curvature. IMPRESSION: 1. No pulmonary embolism with mild limitations as detailed above. 2. Posterior right upper lobe airspace and ground-glass opacity consistent with pneumonia. No centrally obstructive mass identified. 3. Mild coronary artery calcification. Electronically Signed   By: Jeronimo Greaves M.D.   On: 08/28/2023 17:20   DG Chest Port 1 View  Result Date: 08/28/2023 CLINICAL DATA:  Recent history of pneumonia. EXAM: PORTABLE CHEST 1 VIEW COMPARISON:  Chest radiograph dated August 18, 2023.  FINDINGS: The heart size and mediastinal contours are within normal limits. Persistent slightly decreased confluent airspace opacity in the right upper lobe. No new focal consolidation. No pleural effusion or pneumothorax. No acute osseous abnormality. IMPRESSION: Persistent slightly decreased confluent airspace opacity in the right upper lobe is concerning for pneumonia. Followup PA and lateral chest X-ray is recommended in 6-8 weeks to ensure resolution and exclude underlying malignancy Electronically Signed   By: Hart Robinsons M.D.   On: 08/28/2023 16:08    Assessment & Plan:   Weakness- Will evaluate for myasthenia gravis. -     Acetylcholine receptor, binding; Future -     Striated muscle antibody; Future -     Ambulatory referral to Neurology  Tremor -     Ambulatory referral to Neurology  Tachycardia- Will start a beta-blocker. -     Metoprolol Succinate ER; Take 1 tablet (25 mg total) by mouth daily.  Dispense: 90 tablet; Refill: 0  Essential hypertension- His blood pressure is adequately well-controlled. -     Metoprolol Succinate ER; Take 1 tablet (25 mg total) by mouth daily.  Dispense: 90 tablet; Refill: 0 -     AMB Referral [facility]BCI Care Management -     Irbesartan; Take 1 tablet (300 mg total) by mouth daily.  Dispense: 90 tablet; Refill: 0  Panic anxiety syndrome- Will start an SSRI. -     Sertraline HCl; Take 1 tablet (25 mg total) by mouth daily.  Dispense: 30 tablet; Refill: 0 -     AMB Referral [facility]BCI Care Management  DOE (dyspnea on exertion)- Will evaluate for CAD with a CT of the coronary arteries. -     Troponin I (High Sensitivity); Future -     Brain natriuretic peptide; Future -     CT CORONARY MORPH W/CTA COR W/SCORE W/CA W/CM &/OR WO/CM; Future  Acquired hypothyroidism- He is euthyroid.  Peripheral polyneuropathy -     Vitamin B1; Future -     Folate; Future -     Zinc; Future  Chronic pain disorder -     oxyCODONE-Acetaminophen; Take 1 tablet by mouth  every 8 (eight) hours as needed for severe pain (pain score  7-10).  Dispense: 90 tablet; Refill: 0  DDD (degenerative disc disease), lumbar -     oxyCODONE-Acetaminophen; Take 1 tablet by mouth every 8 (eight) hours as needed for severe pain (pain score 7-10).  Dispense: 90 tablet; Refill: 0  Chronic low back pain without sciatica, unspecified back pain laterality -     oxyCODONE-Acetaminophen; Take 1 tablet by mouth every 8 (eight) hours as needed for severe pain (pain score 7-10).  Dispense: 90 tablet; Refill: 0  DDD (degenerative disc disease), cervical -     oxyCODONE-Acetaminophen; Take 1 tablet by mouth every 8 (eight) hours as needed for severe pain (pain score 7-10).  Dispense: 90 tablet; Refill: 0     Follow-up: Return in about 4 weeks (around 10/02/2023).  Sanda Linger, MD

## 2023-09-06 IMAGING — DX DG CHEST 2V
2 series · 2 of 2 positions shown · non-contrast
Comparison: 02/22/2014

CLINICAL DATA: Cough

EXAM:
CHEST - 2 VIEW

[chest pa]
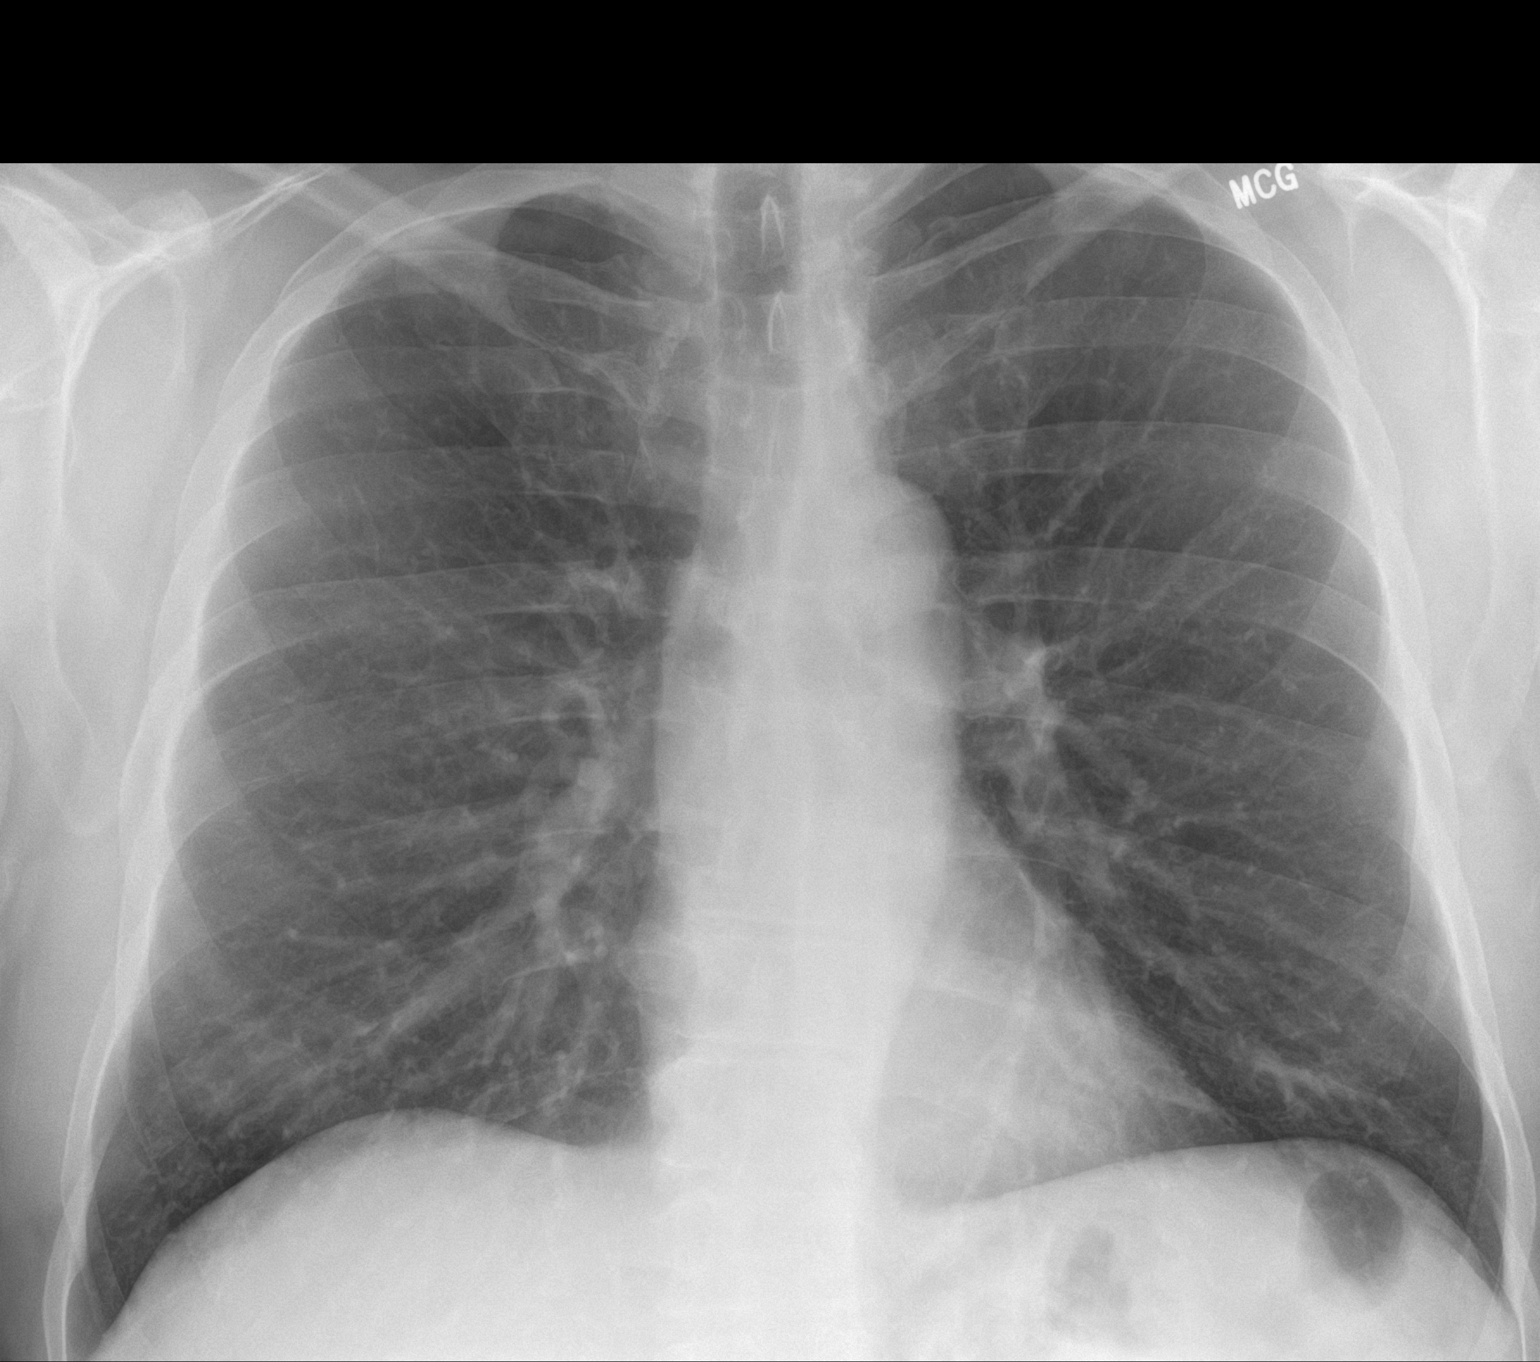

[chest lat]
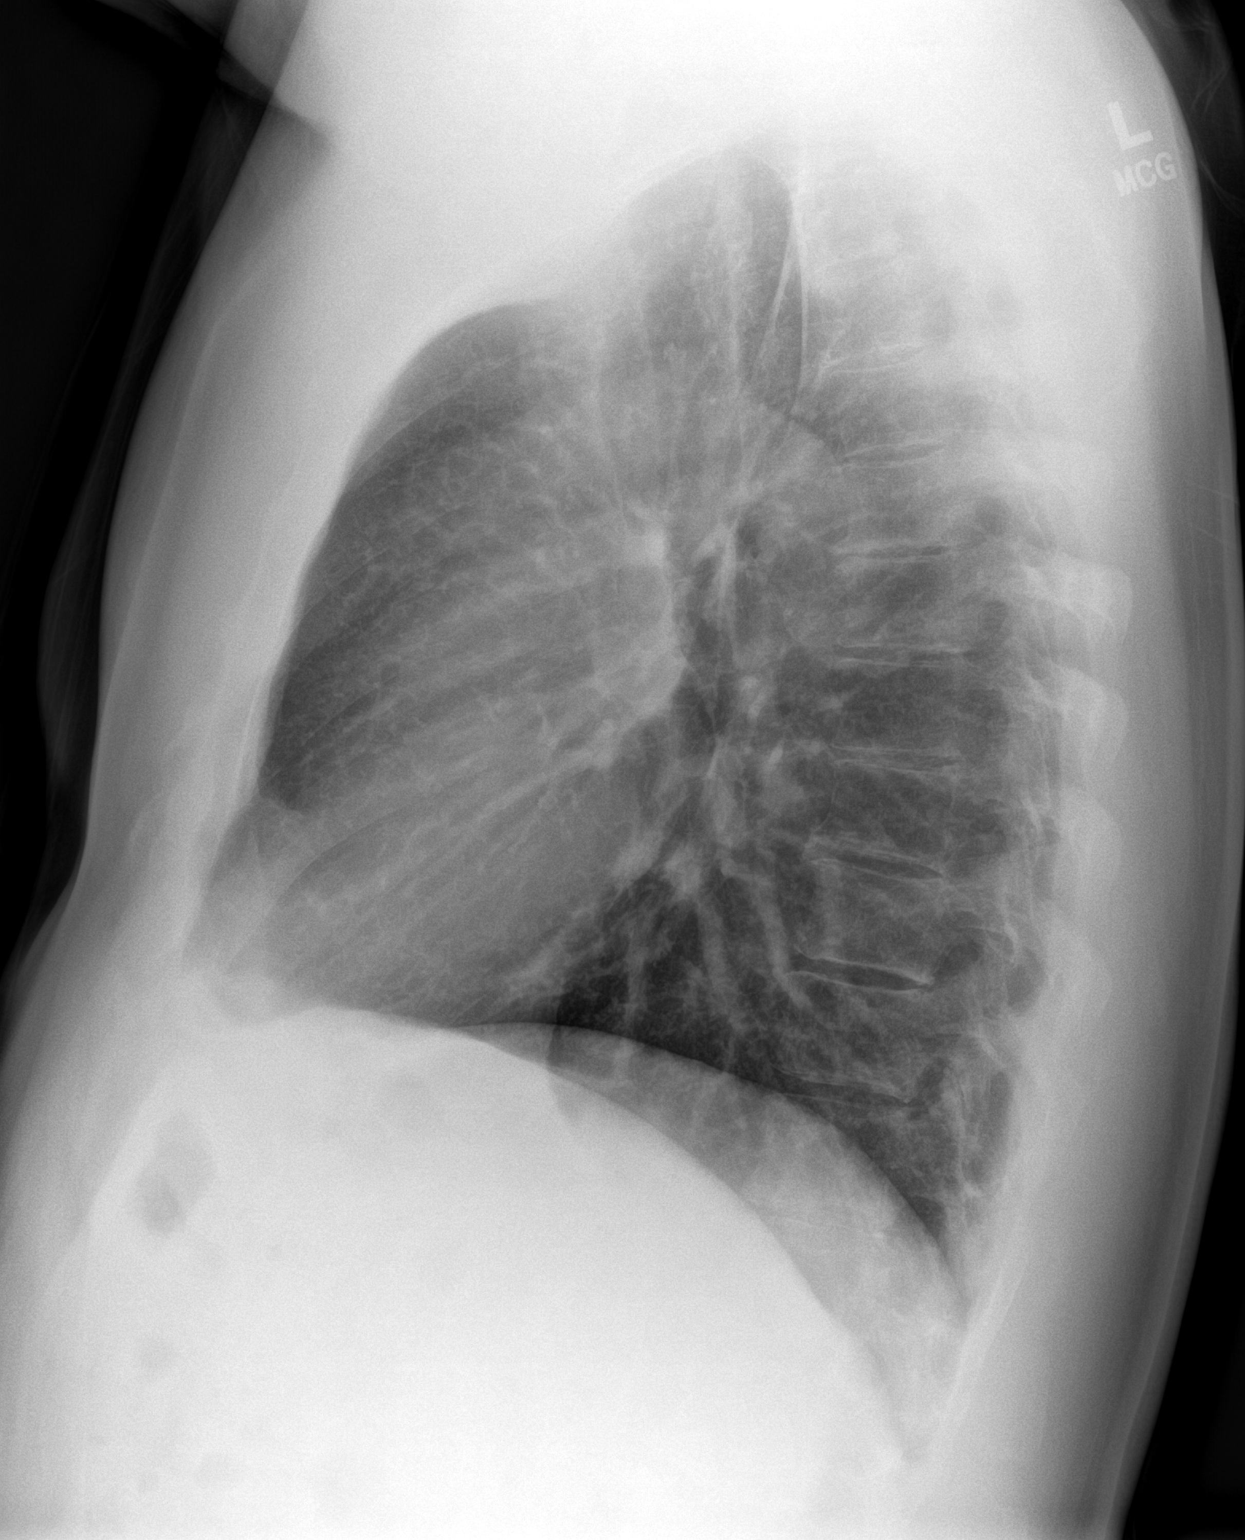

[2 of 2 positions shown; findings below may reference images not displayed]

FINDINGS: The heart size and mediastinal contours are within normal limits.
Both lungs are clear. The visualized skeletal structures are
unremarkable.
IMPRESSION: No active cardiopulmonary disease.

## 2023-09-07 ENCOUNTER — Encounter: Payer: Self-pay | Admitting: Internal Medicine

## 2023-09-07 NOTE — Progress Notes (Signed)
  Care Coordination  Outreach Note  09/07/2023 Name: Jonathan Luna MRN: [id number] DOB: August 29, 1966   Care Coordination Outreach Attempts: A second unsuccessful outreach was attempted today to offer the patient with information about available care coordination services.  Follow Up Plan:  Additional outreach attempts will be made to offer the patient care coordination information and services.   Encounter Outcome:  No Answer  Burman Nieves, CCMA Care Coordination Care Guide Direct Dial: [phone]

## 2023-09-07 NOTE — Progress Notes (Signed)
  Care Coordination   Note   09/07/2023 Name: SAAID YOUNGBLUT MRN: [id number] DOB: [birth year]  ZIVEN BENKO is a 57 y.o. year old male who sees [name], MD for primary care. I reached out to [name] by phone today to offer care coordination services.  Mr. Sines was given information about Care Coordination services today including:   The Care Coordination services include support from the care team which includes your Nurse Coordinator, Clinical Social Worker, or Pharmacist.  The Care Coordination team is here to help remove barriers to the health concerns and goals most important to you. Care Coordination services are voluntary, and the patient may decline or stop services at any time by request to their care team member.   Care Coordination Consent Status: Patient agreed to services and verbal consent obtained.   Follow up plan:  Telephone appointment with care coordination team member scheduled for:  09/11/2023  Encounter Outcome:  Patient Scheduled from referral   Burman Nieves, [facility] Care Coordination Care Guide Direct Dial: [phone]

## 2023-09-09 ENCOUNTER — Telehealth: Payer: Self-pay

## 2023-09-09 ENCOUNTER — Other Ambulatory Visit: Payer: Self-pay

## 2023-09-09 ENCOUNTER — Telehealth: Payer: Self-pay | Admitting: Internal Medicine

## 2023-09-09 DIAGNOSIS — E039 Hypothyroidism, unspecified: Secondary | ICD-10-CM

## 2023-09-09 MED ORDER — UNITHROID 125 MCG PO TABS: 125.0000 ug | ORAL_TABLET | Freq: Every day | ORAL | 0 refills | Status: DC

## 2023-09-09 NOTE — Telephone Encounter (Signed)
Patient called stating that the neurologist states that they aren't able to schedule him an appointment until you finish 3 open office notes. Please advise.

## 2023-09-09 NOTE — Telephone Encounter (Signed)
Medication has been sent to an alternative pharmacy.

## 2023-09-09 NOTE — Telephone Encounter (Signed)
[facility] pharmacy states they are not able to order the pt's UNITHROID 125 MCG tablet, and will not be able to fill the RX.   Please send RX to alternative pharmacy.

## 2023-09-11 ENCOUNTER — Ambulatory Visit: Payer: Self-pay | Admitting: Licensed Clinical Social Worker

## 2023-09-11 LAB — ACETYLCHOLINE RECEPTOR, BINDING: A CHR BINDING ABS: <0.3 nmol/L

## 2023-09-11 LAB — ZINC: Zinc: 60 ug/dL (ref 60–130)

## 2023-09-11 LAB — VITAMIN B1: Vitamin B1 (Thiamine): 14 nmol/L (ref 8–30)

## 2023-09-11 LAB — STRIATED MUSCLE ANTIBODY: STRIATED MUSCLE AB SCREEN: NEGATIVE

## 2023-09-11 NOTE — Patient Outreach (Signed)
  Care Coordination   Initial Visit Note   09/11/2023 Name: Jonathan Luna MRN: [id number] DOB: Jul 16, 1966  Jonathan Luna is a 57 y.o. year old male who sees Jonathan Grandchild, MD for primary care. I spoke with  Jonathan Luna by phone today.  What matters to the patients health and wellness today?  LCSW  Jonathan Luna and Jonathan Luna completed initial call. Jonathan Luna reports concerns with anxiety   Goals Addressed             This Visit's Progress    Care Coordination       Activities and task to complete in order to accomplish goals.   EMOTIONAL / MENTAL HEALTH SUPPORT You have decided not to move forward with counseling and would like to work with me on brief coping skills to assist you with managing anxiety Start / continue relaxed breathing 3 times daily Continue with compliance of taking medication prescribed by Doctor  Call [facility] to check for an earlier appt.         SDOH assessments and interventions completed:  Yes     Care Coordination Interventions:  Yes, provided   Interventions Today    Flowsheet Row Most Recent Value  Chronic Disease   Chronic disease during today's visit Other  [Anxiety and Panic Disorder]  General Interventions   General Interventions Discussed/Reviewed Community Resources  [Sent Jonathan Luna information regarding [facility]]  Mental Health Interventions   Mental Health Discussed/Reviewed Anxiety, Coping Strategies        Follow up plan: Follow up call scheduled for 09/25/2023    Encounter Outcome:  Patient Visit Completed   Jonathan Luna MSW, LCSW Licensed Clinical Social Worker  [facility], Population Health Direct Dial: [phone]  Fax: [phone]

## 2023-09-17 ENCOUNTER — Other Ambulatory Visit: Payer: Self-pay | Admitting: Internal Medicine

## 2023-09-17 DIAGNOSIS — F410 Panic disorder [episodic paroxysmal anxiety] without agoraphobia: Secondary | ICD-10-CM

## 2023-09-18 ENCOUNTER — Telehealth: Payer: Self-pay | Admitting: Internal Medicine

## 2023-09-18 MED ORDER — CLONAZEPAM 0.5 MG PO TABS: 0.5000 mg | ORAL_TABLET | Freq: Two times a day (BID) | ORAL | 1 refills | Status: DC | PRN

## 2023-09-18 NOTE — Telephone Encounter (Signed)
Made patient aware that we have not received anything in regards to his FMLA. He gave a verbal understanding

## 2023-09-18 NOTE — Telephone Encounter (Signed)
Patient said that his FMLA paperwork was faxed over earlier this week. He would like to know if the office received it. If not, he would like a call back at [phone].

## 2023-09-21 ENCOUNTER — Encounter: Payer: Self-pay | Admitting: Internal Medicine

## 2023-09-22 ENCOUNTER — Encounter: Payer: Self-pay | Admitting: Neurology

## 2023-09-22 ENCOUNTER — Telehealth: Payer: Self-pay

## 2023-09-22 ENCOUNTER — Ambulatory Visit: Payer: 59 | Admitting: Neurology

## 2023-09-22 ENCOUNTER — Other Ambulatory Visit: Payer: Self-pay

## 2023-09-22 ENCOUNTER — Ambulatory Visit (HOSPITAL_COMMUNITY): Payer: 59

## 2023-09-22 VITALS — BP 150/93 | HR 79 | Resp 14 | Wt 189.6 lb

## 2023-09-22 DIAGNOSIS — R269 Unspecified abnormalities of gait and mobility: Secondary | ICD-10-CM

## 2023-09-22 DIAGNOSIS — R202 Paresthesia of skin: Secondary | ICD-10-CM | POA: Diagnosis not present

## 2023-09-22 MED ORDER — DULOXETINE HCL 60 MG PO CPEP: 60.0000 mg | ORAL_CAPSULE | Freq: Every day | ORAL | 11 refills | Status: DC

## 2023-09-22 MED ORDER — GABAPENTIN 300 MG PO CAPS: 600.0000 mg | ORAL_CAPSULE | Freq: Three times a day (TID) | ORAL | 11 refills | Status: DC

## 2023-09-22 NOTE — Telephone Encounter (Signed)
Handicap placard form signed for patient pick up.

## 2023-09-22 NOTE — Progress Notes (Unsigned)
Chief Complaint  Patient presents with   New Patient (Initial Visit)    Rm14, wife (nlind will need escorted out),  Internal ED referral for peripheral neuropathy: arms/legs shaking, frequent falls, vibrating left thigh feeling, lack of sleep, periodic headaches,  refer to mychart msg       ASSESSMENT AND PLAN  Jonathan Luna is a 57 y.o. male   Subacute onset, progressive worsening gait abnormality, limb paresthesia, allodynia,  Mild to moderate trunk ataxia, and limb dysmetria, wide-based unsteady gait, absent reflex,  Differentiation diagnosis include AIDP, Jonathan Luna variant  Laboratory evaluations,  MRI of neural axis,  If there is no significant structural abnormality by MRI, we will proceed with lumbar puncture,  DIAGNOSTIC DATA (LABS, IMAGING, TESTING) - I reviewed patient records, labs, notes, testing and imaging myself where available.   MEDICAL HISTORY:  Jonathan Luna, seen in request by    [name], MD   History is obtained from the patient and review of electronic medical records. I personally reviewed pertinent available imaging films in PACS.   PMHx of  Depression, Anxiety Chronic low back pain, on percocet 5/325 tid by Dr. Yetta Barre HTN Pneumonia Hereditary cataract,  Left retinal detachment,  Right blood vessel    Head fell backwars, in his 20s,  no foacal deficity,   No inconenctc,e goes to his anke.  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, handling packages, pacakge put on pakacte, ship receiving product, could be up 60 Lb, lift,   In Nov round Nov 2th, peneumonia, fever, cough, not feeling well, still have little cough,   Muscuou still,   Anitbiotic, amoxicicline, 3 days, he felt 3 finger snumbness, next day, both hand tinglisn, fingers stiff, could nto make a fist, gemsed, stiff,  painful, sometimes, left thrub want to explaode,   Botthom of his feet, toes, cloth wifp on fire, about eh same times,   Trouble  walking, falling, start noticed it when he  was going to the car, Nov 11th, he did went to work for a week Nov   Shaking onset max, fell 4 times in the last 2 weeks, leg buckles when walking, hand shakes when picking up anything, real weak get out of breath, shallow breathing,  At the bottom of her left thigh and something keep vibrating like a motor, weird pain involves biceps at times, pain at the bottom of the neck that runs down the middle of the back, twitching inside the body throughout the body, periodic headache  Oct 28th, alreay complains of hands numbness,   Nov 22, [employer] for medicine, legs were stiff, loose, sob, weak, he has to stop, has to stop for few times, leaving, going out of [employer], pull hisl elgs aone, low back pain, every since, he could not walk       PHYSICAL EXAM:   Vitals:   09/22/23 1432  BP: (!) 150/93  Pulse: 79  Resp: 14  Weight: 189 lb 9.5 oz (86 kg)   Not recorded     Body mass index is 29.69 kg/m.  PHYSICAL EXAMNIATION:  Gen: NAD, conversant, well nourised, well groomed                     Cardiovascular: Regular rate rhythm, no peripheral edema, warm, nontender. Eyes: Conjunctivae clear without exudates or hemorrhage Neck: Supple, no carotid bruits. Pulmonary: Clear to auscultation bilaterally   NEUROLOGICAL EXAM:  MENTAL STATUS: Speech/cognition: Awake, alert,  oriented to history taking and casual conversation CRANIAL NERVES: CN II: Visual fields are full to confrontation.  Postsurgical irregular pupil  CN III, IV, VI: extraocular movement are normal. No ptosis. CN V: Facial sensation is intact to light touch CN VII: Face is symmetric with normal eye closure  CN VIII: Hearing is normal to causal conversation. CN IX, X: Phonation is normal. CN XI: Head turning and shoulder shrug are intact  MOTOR: Felt to there was no significant weakness, but motor examination somewhat limited by his dysmetrias/action tremor, mild bilateral hip  flexion, ankle dorsiflexion weakness  REFLEXES: Areflexia  SENSORY: Well-preserved toe vibratory sensation, proprioception, pinprick, allodynia of bilateral feet plantar surface  COORDINATION: Moderate truncal ataxia, mild to moderate bilateral finger-to-nose heel-to-shin dysmetria  GAIT/STANCE: Need push-up to get up from seated position, wide-based unsteady  REVIEW OF SYSTEMS:  Full 14 system review of systems performed and notable only for as above All other review of systems were negative.   ALLERGIES: Allergies  Allergen Reactions   Amoxicillin Other (See Comments)    Pt states that it does not work for him when he had back to back pneumonia   Effexor [Venlafaxine] Diarrhea   Fluoxetine Hcl    Prozac [Fluoxetine Hcl] Diarrhea    HOME MEDICATIONS: Current Outpatient Medications  Medication Sig Dispense Refill   aspirin EC 81 MG tablet Take 81 mg by mouth daily. Swallow whole.     Cholecalciferol (VITAMIN D-3) 25 MCG (1000 UT) CAPS Take 1 capsule by mouth daily.     clonazePAM (KLONOPIN) 0.5 MG tablet Take 1 tablet (0.5 mg total) by mouth 2 (two) times daily as needed for anxiety. 60 tablet 1   fluticasone (FLONASE) 50 MCG/ACT nasal spray Place 2 sprays into both nostrils daily. 48 g 1   indapamide (LOZOL) 1.25 MG tablet Take 1 tablet (1.25 mg total) by mouth daily. 90 tablet 0   irbesartan (AVAPRO) 300 MG tablet Take 1 tablet (300 mg total) by mouth daily. 90 tablet 0   metoprolol succinate (TOPROL XL) 25 MG 24 hr tablet Take 1 tablet (25 mg total) by mouth daily. 90 tablet 0   Multiple Vitamins-Minerals (ZINC PO) Take by mouth.     oxyCODONE-acetaminophen (PERCOCET/ROXICET) 5-325 MG tablet Take 1 tablet by mouth every 8 (eight) hours as needed for severe pain (pain score 7-10). 90 tablet 0   sertraline (ZOLOFT) 25 MG tablet Take 1 tablet (25 mg total) by mouth daily. 30 tablet 0   UNITHROID 125 MCG tablet Take 1 tablet (125 mcg total) by mouth daily before breakfast. 90  tablet 0   No current facility-administered medications for this visit.    PAST MEDICAL HISTORY: Past Medical History:  Diagnosis Date   Allergy    Anxiety    Arthritis    Cataract    Depression    Detached retina    Heart murmur    History of bilateral cataract extraction    Hyperlipidemia    Hypothyroidism    Low back pain    Panic attack    Panic attacks     PAST SURGICAL HISTORY: Past Surgical History:  Procedure Laterality Date   CATARACT EXTRACTION     EYE SURGERY      FAMILY HISTORY: Family History  Problem Relation Age of Onset   Breast cancer Mother    Hypertension Father    Heart disease Father    Asthma Father    Emphysema Father        smoker  for 45 years   Diabetes Brother    Hyperlipidemia Brother     SOCIAL HISTORY: Social History   Socioeconomic History   Marital status: Married    Spouse name: Not on file   Number of children: 0   Years of education: Not on file   Highest education level: Not on file  Occupational History   Occupation: Cumputer operator/security clearance [employer]  Tobacco Use   Smoking status: Never    Passive exposure: Past   Smokeless tobacco: Never   Tobacco comments:    As a teenager  Vaping Use   Vaping status: Never Used  Substance and Sexual Activity   Alcohol use: Not Currently   Drug use: No   Sexual activity: Yes    Birth control/protection: Condom  Other Topics Concern   Not on file  Social History Narrative   Caffienated drinks-no   Seat belt use often-yes   Regular Exercise-yes   Smoke alarm in the home-yes   Firearms/guns in the home-no   History of physical abuse-no               Social Determinants of Health   Financial Resource Strain: Patient Declined (05/27/2023)   Overall Financial Resource Strain (CARDIA)    Difficulty of Paying Living Expenses: Patient declined  Food Insecurity: No Food Insecurity (08/28/2023)   Hunger Vital Sign    Worried About Running Out of Food  in the Last Year: Never true    Ran Out of Food in the Last Year: Never true  Transportation Needs: No Transportation Needs (08/28/2023)   PRAPARE - Administrator, Civil Service (Medical): No    Lack of Transportation (Non-Medical): No  Physical Activity: Insufficiently Active (05/27/2023)   Exercise Vital Sign    Days of Exercise per Week: 4 days    Minutes of Exercise per Session: 30 min  Stress: Stress Concern Present (05/27/2023)   [employer] of Occupational Health - Occupational Stress Questionnaire    Feeling of Stress : Rather much  Social Connections: Unknown (05/27/2023)   Social Connection and Isolation Panel [NHANES]    Frequency of Communication with Friends and Family: Patient declined    Frequency of Social Gatherings with Friends and Family: Once a week    Attends Religious Services: More than 4 times per year    Active Member of [employer] or Organizations: Yes    Attends Banker Meetings: More than 4 times per year    Marital Status: Patient declined  Intimate Partner Violence: Not At Risk (08/28/2023)   Humiliation, Afraid, Rape, and Kick questionnaire    Fear of Current or Ex-Partner: No    Emotionally Abused: No    Physically Abused: No    Sexually Abused: No      Levert Feinstein, M.D. Ph.D.  [facility] [address], [address] [place], [place] [address] Ph: [phone] Fax: [phone]  CC:  Merlene Laughter, DO [address] [place],  [place] [address]  [name], MD

## 2023-09-22 NOTE — Patient Instructions (Signed)
Meds ordered this encounter  Medications   DULoxetine (CYMBALTA) 60 MG capsule    Sig: Take 1 capsule (60 mg total) by mouth daily.    Dispense:  30 capsule    Refill:  11   gabapentin (NEURONTIN) 300 MG capsule    Sig: Take 2 capsules (600 mg total) by mouth 3 (three) times daily.    Dispense:  180 capsule    Refill:  11

## 2023-09-22 NOTE — Progress Notes (Signed)
Order for standard walker and  transfer bench placed and faxed to [employer], attn intake. [phone]

## 2023-09-22 NOTE — Telephone Encounter (Signed)
Called pt to let know handicap placard ready at the front desk. Pt stated that he will need sooner emg for his disability. I told pt I would route this to our nerve conduction tech and she may be able to work him in if someone falls off the schedule. He voiced gratitude and understanding

## 2023-09-23 ENCOUNTER — Other Ambulatory Visit (INDEPENDENT_AMBULATORY_CARE_PROVIDER_SITE_OTHER): Payer: 59

## 2023-09-23 DIAGNOSIS — R269 Unspecified abnormalities of gait and mobility: Secondary | ICD-10-CM

## 2023-09-23 DIAGNOSIS — R202 Paresthesia of skin: Secondary | ICD-10-CM

## 2023-09-23 MED ORDER — GADOBENATE DIMEGLUMINE 529 MG/ML IV SOLN
15.0000 mL | Freq: Once | INTRAVENOUS | Status: AC | PRN
  Administered 2023-09-23: 15 mL via INTRAVENOUS

## 2023-09-24 ENCOUNTER — Telehealth: Payer: Self-pay | Admitting: Neurology

## 2023-09-24 DIAGNOSIS — R269 Unspecified abnormalities of gait and mobility: Secondary | ICD-10-CM

## 2023-09-24 DIAGNOSIS — R202 Paresthesia of skin: Secondary | ICD-10-CM

## 2023-09-24 NOTE — Telephone Encounter (Signed)
LP order sent to GI [phone]

## 2023-09-24 NOTE — Telephone Encounter (Signed)
Please call patient, MRI of the brain and cervical spine showed no significant abnormality to explain his profound gait abnormality  I have ordered lumbar puncture, please make sure it happens soon

## 2023-09-25 ENCOUNTER — Telehealth: Payer: Self-pay

## 2023-09-25 DIAGNOSIS — Z0279 Encounter for issue of other medical certificate: Secondary | ICD-10-CM

## 2023-09-25 NOTE — Telephone Encounter (Signed)
Patients Disability forms and FMLA forms has been completed. His Disability forms has been faxed FMLA has been placed up front for him to pick up.

## 2023-09-27 IMAGING — CR DG CHEST 2V
2 series · 2 of 2 positions shown · non-contrast
Comparison: Radiograph February 27, 2022

CLINICAL DATA: Chest pain

EXAM:
CHEST - 2 VIEW

[chest pa]
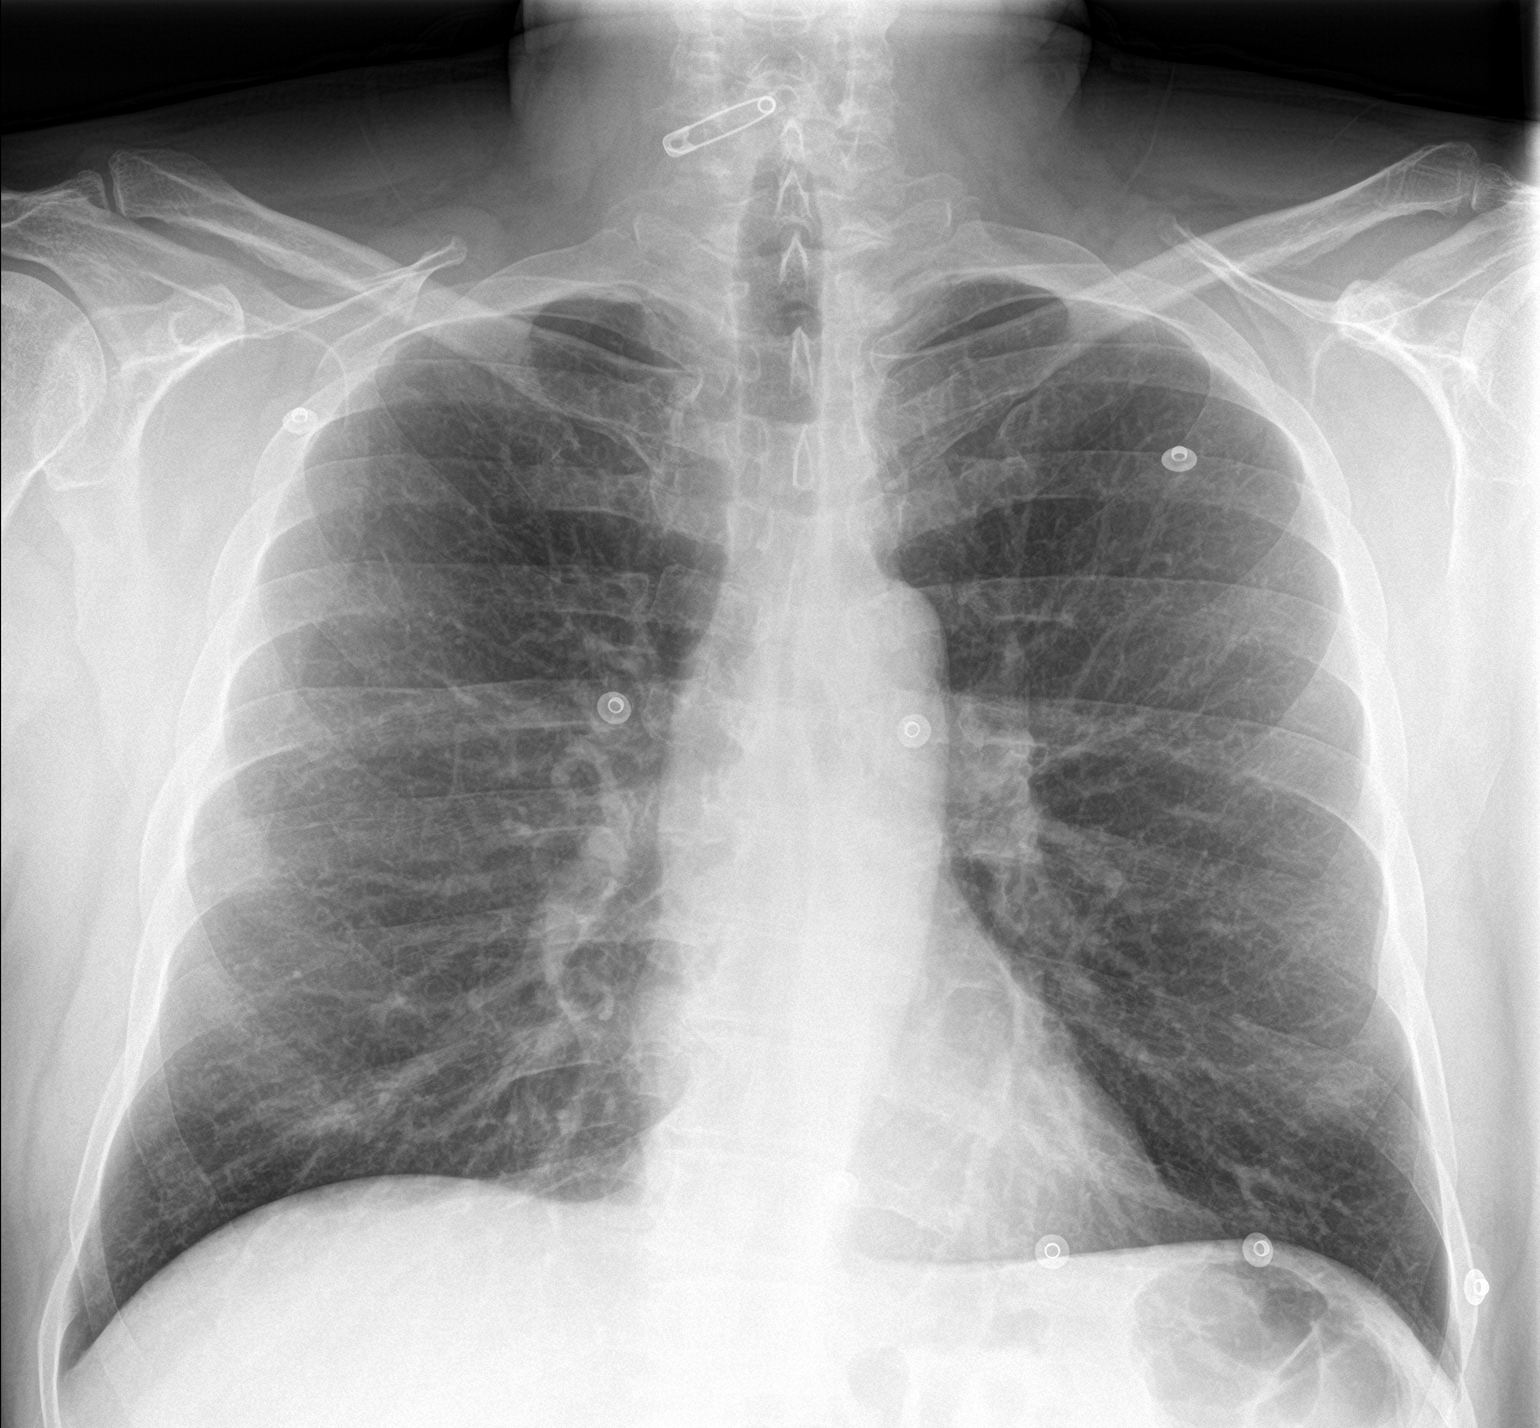

[chest lat]
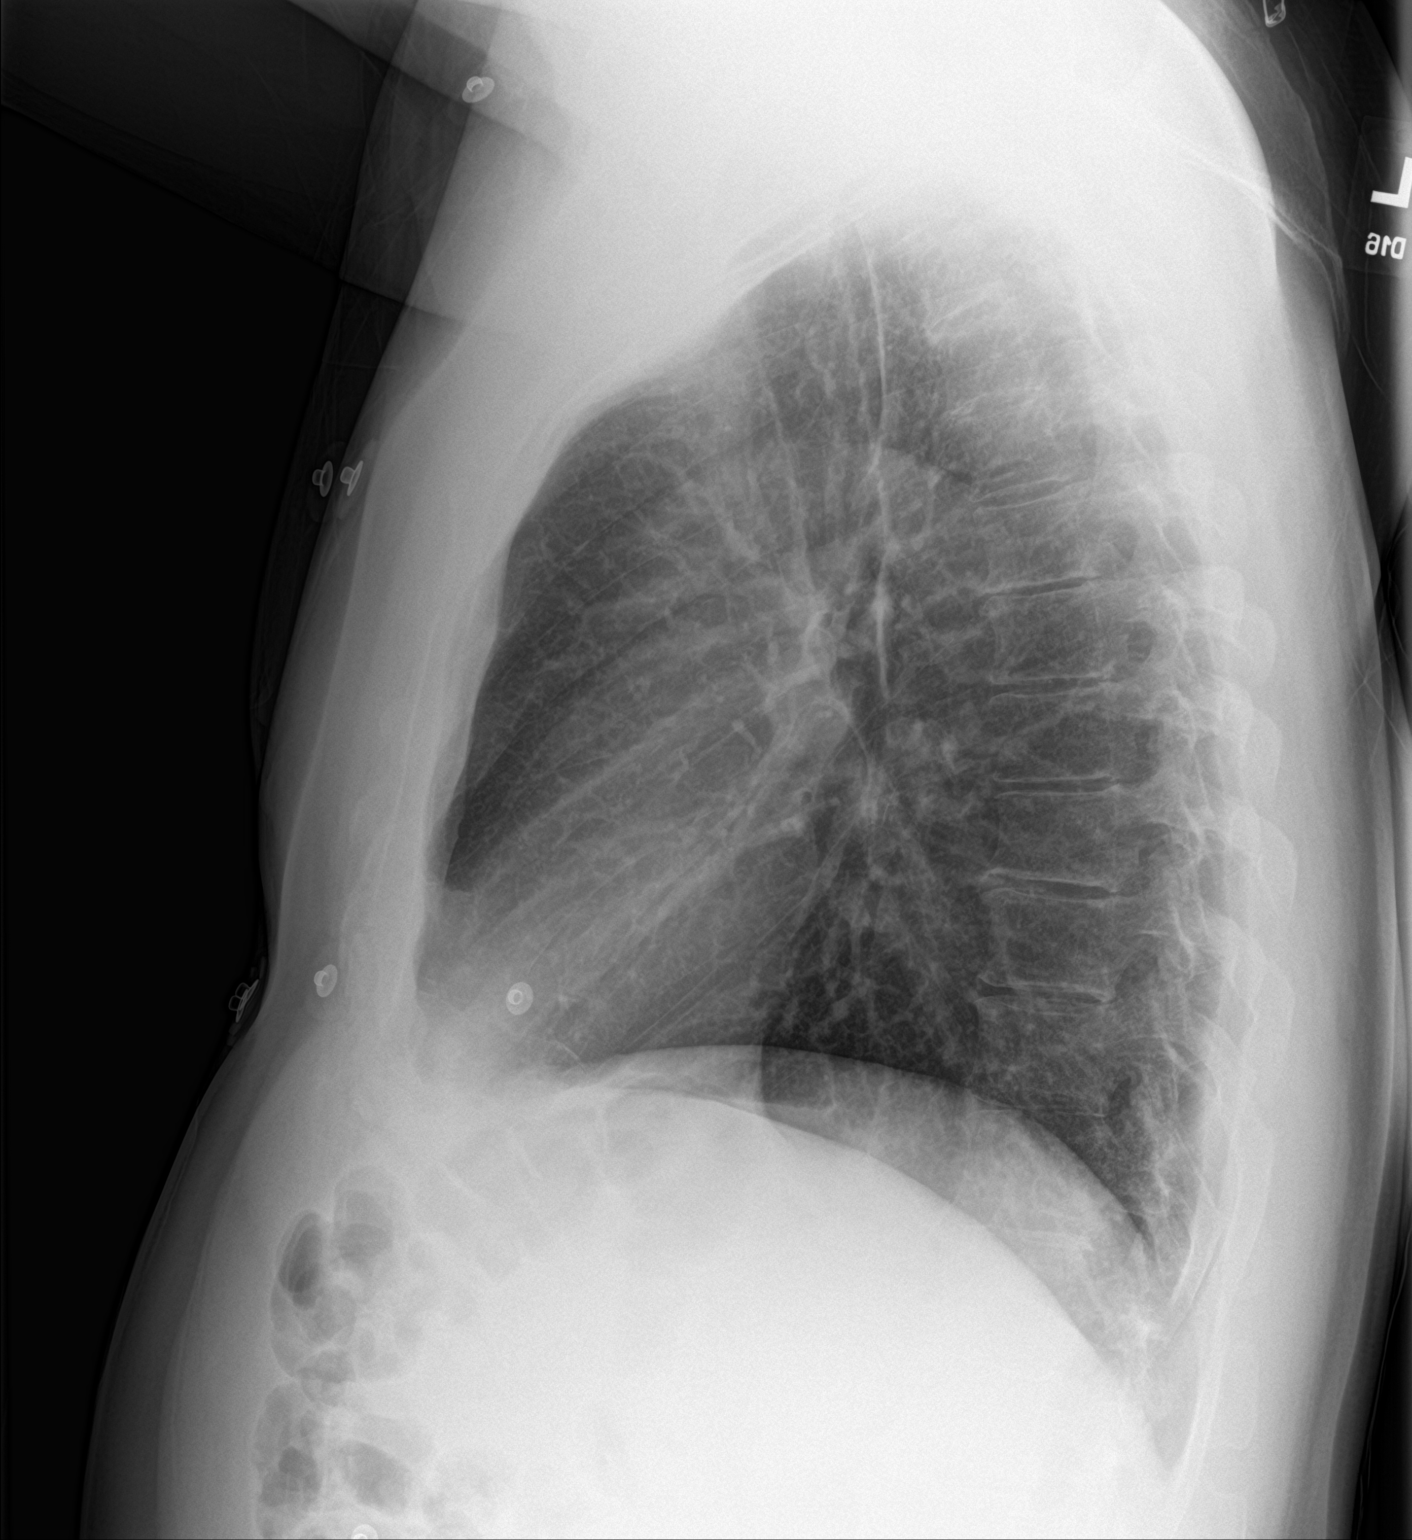

[2 of 2 positions shown; findings below may reference images not displayed]

FINDINGS: The heart size and mediastinal contours are within normal limits. No
focal airspace consolidation. No pleural effusion. No pneumothorax.
Thoracic spondylosis.
IMPRESSION: No acute cardiopulmonary disease.

## 2023-09-28 ENCOUNTER — Ambulatory Visit: Payer: 59 | Admitting: Internal Medicine

## 2023-09-28 ENCOUNTER — Other Ambulatory Visit: Payer: Self-pay | Admitting: Internal Medicine

## 2023-09-28 ENCOUNTER — Encounter: Payer: Self-pay | Admitting: Internal Medicine

## 2023-09-28 ENCOUNTER — Telehealth: Payer: Self-pay | Admitting: Neurology

## 2023-09-28 VITALS — BP 132/84 | HR 94 | Temp 98.7°F | Resp 16 | Ht 67.0 in | Wt 184.1 lb

## 2023-09-28 DIAGNOSIS — E039 Hypothyroidism, unspecified: Secondary | ICD-10-CM

## 2023-09-28 DIAGNOSIS — I10 Essential (primary) hypertension: Secondary | ICD-10-CM | POA: Diagnosis not present

## 2023-09-28 DIAGNOSIS — G629 Polyneuropathy, unspecified: Secondary | ICD-10-CM | POA: Diagnosis not present

## 2023-09-28 NOTE — Telephone Encounter (Signed)
Pt called to ask about an earlier appointment for his NCV/EMG

## 2023-09-28 NOTE — Progress Notes (Unsigned)
Subjective:  Patient ID: Jonathan Luna, male    DOB: 1966-02-25  Age: 57 y.o. MRN: [id number]  CC: Hypothyroidism   HPI Jonathan Luna presents for f/up ---  Discussed the use of AI scribe software for clinical note transcription with the patient, who gave verbal consent to proceed.  History of Present Illness   The patient, with a history of recent pneumonia, presents with ongoing neurological symptoms. He reports a sensation of 'electricity' or 'vibration' in his body, particularly in his legs and feet. He also describes stiffness in his hands and wrists, extending to his forearms, and intermittent sharp pains in his biceps. He has experienced episodes of leg buckling, leading to falls and a sprained wrist.  The patient has undergone brain and cervical spine imaging, with results reported as normal.  In addition to his neurological symptoms, the patient reports residual cough and occasional mucus production following a recent bout of pneumonia. He manages these symptoms with Mucinex as needed.  The patient's thyroid function has been fluctuating, with recent tests indicating both potential over and under-treatment. However, he denies symptoms of hypothyroidism such as constipation, weight gain, or leg swelling.  The patient has requested a lightweight wheelchair due to his mobility issues and falls. He expresses hope and willingness to participate in any necessary therapy or treatment to improve his condition.       Outpatient Medications Prior to Visit  Medication Sig Dispense Refill   aspirin EC 81 MG tablet Take 81 mg by mouth daily. Swallow whole.     Cholecalciferol (VITAMIN D-3) 25 MCG (1000 UT) CAPS Take 1 capsule by mouth daily.     clonazePAM (KLONOPIN) 0.5 MG tablet Take 1 tablet (0.5 mg total) by mouth 2 (two) times daily as needed for anxiety. 60 tablet 1   DULoxetine (CYMBALTA) 60 MG capsule Take 1 capsule (60 mg total) by mouth daily. 30 capsule 11   gabapentin (NEURONTIN) 300  MG capsule Take 2 capsules (600 mg total) by mouth 3 (three) times daily. 180 capsule 11   indapamide (LOZOL) 1.25 MG tablet Take 1 tablet (1.25 mg total) by mouth daily. 90 tablet 0   irbesartan (AVAPRO) 300 MG tablet Take 1 tablet (300 mg total) by mouth daily. 90 tablet 0   metoprolol succinate (TOPROL XL) 25 MG 24 hr tablet Take 1 tablet (25 mg total) by mouth daily. 90 tablet 0   Multiple Vitamins-Minerals (ZINC PO) Take by mouth.     oxyCODONE-acetaminophen (PERCOCET/ROXICET) 5-325 MG tablet Take 1 tablet by mouth every 8 (eight) hours as needed for severe pain (pain score 7-10). 90 tablet 0   sertraline (ZOLOFT) 25 MG tablet Take 1 tablet (25 mg total) by mouth daily. 30 tablet 0   UNITHROID 125 MCG tablet Take 1 tablet (125 mcg total) by mouth daily before breakfast. 90 tablet 0   fluticasone (FLONASE) 50 MCG/ACT nasal spray Place 2 sprays into both nostrils daily. (Patient not taking: Reported on 09/28/2023) 48 g 1   No facility-administered medications prior to visit.    ROS Review of Systems  Objective:  Pulse 94   Temp 98.7 F (37.1 C) (Temporal)   Ht 5\' 7"  (1.702 m)   Wt 184 lb 2 oz (83.5 kg)   SpO2 100%   BMI 28.84 kg/m   BP Readings from Last 3 Encounters:  09/22/23 (!) 150/93  09/04/23 138/86  08/30/23 (!) 149/95    Wt Readings from Last 3 Encounters:  09/28/23 184 lb 2  oz (83.5 kg)  09/22/23 189 lb 9.5 oz (86 kg)  09/04/23 189 lb 9.6 oz (86 kg)    Physical Exam Constitutional:      General: He is not in acute distress.    Appearance: He is ill-appearing (in a wheelchair). He is not diaphoretic.  Eyes:     General: No scleral icterus.    Conjunctiva/sclera: Conjunctivae normal.  Cardiovascular:     Rate and Rhythm: Normal rate and regular rhythm.     Heart sounds: No murmur heard.    No friction rub. No gallop.  Pulmonary:     Effort: Pulmonary effort is normal.     Breath sounds: No stridor. No wheezing, rhonchi or rales.  Abdominal:     General:  Abdomen is flat.     Palpations: There is no mass.     Tenderness: There is no abdominal tenderness. There is no guarding.     Hernia: No hernia is present.  Musculoskeletal:        General: Normal range of motion.     Cervical back: Neck supple.     Right lower leg: No edema.     Left lower leg: No edema.  Lymphadenopathy:     Cervical: No cervical adenopathy.  Skin:    General: Skin is warm and dry.  Neurological:     Mental Status: He is alert. Mental status is at baseline.     Lab Results  Component Value Date   WBC 8.5 08/30/2023   HGB 14.0 08/30/2023   HCT 42.8 08/30/2023   PLT 334 08/30/2023   GLUCOSE 96 08/30/2023   CHOL 178 01/26/2023   TRIG 170.0 (H) 01/26/2023   HDL 40.00 01/26/2023   LDLDIRECT 150.0 04/07/2022   LDLCALC 104 (H) 01/26/2023   ALT 20 08/30/2023   AST 19 08/30/2023   NA 138 08/30/2023   K 3.7 08/30/2023   CL 106 08/30/2023   CREATININE 0.76 08/30/2023   BUN 8 08/30/2023   CO2 22 08/30/2023   TSH 8.600 (H) 09/22/2023   PSA 1.99 10/27/2022   INR 1.0 08/29/2023   HGBA1C 5.3 09/22/2023    VAS [name] LOWER EXTREMITY VENOUS (DVT) Result Date: 08/30/2023  Lower Venous DVT Study Patient Name:  Jonathan Luna  Date of Exam:   08/29/2023 Medical Rec #: [id number]   Accession #:    [id number] Date of Birth: [birth year]   Patient Gender: M Patient Age:   24 years Exam Location:  [facility] Procedure:      VAS [name] LOWER EXTREMITY VENOUS (DVT) Referring Phys: [facility] GOEL --------------------------------------------------------------------------------  Indications: Swelling, and Edema.  Risk Factors: None identified. Comparison Study: No prior study Performing Technologist: Shona Simpson  Examination Guidelines: A complete evaluation includes B-mode imaging, spectral Doppler, color Doppler, and power Doppler as needed of all accessible portions of each vessel. Bilateral testing is considered an integral part of a complete examination. Limited examinations for  reoccurring indications may be performed as noted. The reflux portion of the exam is performed with the patient in reverse Trendelenburg.  +---------+---------------+---------+-----------+----------+--------------+ RIGHT    CompressibilityPhasicitySpontaneityPropertiesThrombus Aging +---------+---------------+---------+-----------+----------+--------------+ CFV      Full           Yes      Yes                                 +---------+---------------+---------+-----------+----------+--------------+ SFJ      Full                                                        +---------+---------------+---------+-----------+----------+--------------+  FV Prox  Full                                                        +---------+---------------+---------+-----------+----------+--------------+ FV Mid   Full                                                        +---------+---------------+---------+-----------+----------+--------------+ FV DistalFull                                                        +---------+---------------+---------+-----------+----------+--------------+ PFV      Full                                                        +---------+---------------+---------+-----------+----------+--------------+ POP      Full           Yes      Yes                                 +---------+---------------+---------+-----------+----------+--------------+ PTV      Full                                                        +---------+---------------+---------+-----------+----------+--------------+ PERO     Full                                                        +---------+---------------+---------+-----------+----------+--------------+   +---------+---------------+---------+-----------+----------+--------------+ LEFT     CompressibilityPhasicitySpontaneityPropertiesThrombus Aging  +---------+---------------+---------+-----------+----------+--------------+ CFV      Full           Yes      Yes                                 +---------+---------------+---------+-----------+----------+--------------+ SFJ      Full                                                        +---------+---------------+---------+-----------+----------+--------------+ FV Prox  Full                                                        +---------+---------------+---------+-----------+----------+--------------+  FV Mid   Full                                                        +---------+---------------+---------+-----------+----------+--------------+ FV DistalFull                                                        +---------+---------------+---------+-----------+----------+--------------+ PFV      Full                                                        +---------+---------------+---------+-----------+----------+--------------+ POP      Full           Yes      Yes                                 +---------+---------------+---------+-----------+----------+--------------+ PTV      Full                                                        +---------+---------------+---------+-----------+----------+--------------+ PERO     Full                                                        +---------+---------------+---------+-----------+----------+--------------+     Summary: BILATERAL: - No evidence of deep vein thrombosis seen in the lower extremities, bilaterally. -No evidence of popliteal cyst, bilaterally.   *See table(s) above for measurements and observations. Electronically signed by Lemar Livings MD on 08/30/2023 at 11:24:35 AM.    Final    DG CHEST PORT 1 VIEW Result Date: 08/29/2023 CLINICAL DATA:  Shortness of breath. EXAM: PORTABLE CHEST 1 VIEW COMPARISON:  August 28, 2023 FINDINGS: Cardiomediastinal silhouette is normal. Mediastinal  contours appear intact. Subtle residual linear airspace consolidation in the right upper thorax. No pneumothorax. Left lung is clear. Osseous structures are without acute abnormality. Soft tissues are grossly normal. IMPRESSION: Subtle residual linear airspace consolidation in the right upper thorax. Electronically Signed   By: Ted Mcalpine M.D.   On: 08/29/2023 14:44   CT Angio Chest PE W and/or Wo Contrast Result Date: 08/28/2023 CLINICAL DATA:  Pneumonia for a month.  On antibiotics. EXAM: CT ANGIOGRAPHY CHEST WITH CONTRAST TECHNIQUE: Multidetector CT imaging of the chest was performed using the standard protocol during bolus administration of intravenous contrast. Multiplanar CT image reconstructions and MIPs were obtained to evaluate the vascular anatomy. RADIATION DOSE REDUCTION: This exam was performed according to the departmental dose-optimization program which includes automated exposure control, adjustment of the mA and/or kV according to patient size and/or use of iterative reconstruction technique. CONTRAST:  75mL  OMNIPAQUE IOHEXOL 350 MG/ML SOLN COMPARISON:  Plain film of earlier today chest CT 05/06/2022 FINDINGS: Cardiovascular: The quality of this exam for evaluation of pulmonary embolism is moderate. The bolus is centered in the SVC. No pulmonary embolism to the large segmental level. Normal aortic caliber. Normal heart size, without pericardial effusion. Mild LAD coronary artery calcification. Mediastinum/Nodes: No mediastinal or hilar adenopathy. Lungs/Pleura: No pleural fluid. Patent airways, including to the right upper lobe. Posterior right upper lobe airspace and less so ground-glass opacity is new since the prior CT. Upper Abdomen: Normal imaged portions of the liver, spleen, stomach, pancreas, gallbladder, adrenal glands, kidneys. Musculoskeletal: Superior endplate irregularity and sclerosis at T12 is most likely degenerative versus less likely related to remote compression  fracture. Review of the MIP images confirms the above findings. Mild S shaped thoracolumbar spine curvature. IMPRESSION: 1. No pulmonary embolism with mild limitations as detailed above. 2. Posterior right upper lobe airspace and ground-glass opacity consistent with pneumonia. No centrally obstructive mass identified. 3. Mild coronary artery calcification. Electronically Signed   By: Jeronimo Greaves M.D.   On: 08/28/2023 17:20   DG Chest Port 1 View Result Date: 08/28/2023 CLINICAL DATA:  Recent history of pneumonia. EXAM: PORTABLE CHEST 1 VIEW COMPARISON:  Chest radiograph dated August 18, 2023. FINDINGS: The heart size and mediastinal contours are within normal limits. Persistent slightly decreased confluent airspace opacity in the right upper lobe. No new focal consolidation. No pleural effusion or pneumothorax. No acute osseous abnormality. IMPRESSION: Persistent slightly decreased confluent airspace opacity in the right upper lobe is concerning for pneumonia. Followup PA and lateral chest X-ray is recommended in 6-8 weeks to ensure resolution and exclude underlying malignancy Electronically Signed   By: Hart Robinsons M.D.   On: 08/28/2023 16:08    Assessment & Plan:  Peripheral polyneuropathy -     For home use only DME standard manual wheelchair with seat cushion     Follow-up: No follow-ups on file.  Sanda Linger, MD

## 2023-09-29 ENCOUNTER — Ambulatory Visit (HOSPITAL_COMMUNITY): Payer: 59

## 2023-09-29 ENCOUNTER — Ambulatory Visit (INDEPENDENT_AMBULATORY_CARE_PROVIDER_SITE_OTHER): Payer: 59

## 2023-09-29 DIAGNOSIS — R269 Unspecified abnormalities of gait and mobility: Secondary | ICD-10-CM

## 2023-09-29 DIAGNOSIS — R202 Paresthesia of skin: Secondary | ICD-10-CM

## 2023-09-29 MED ORDER — GADOBENATE DIMEGLUMINE 529 MG/ML IV SOLN
15.0000 mL | Freq: Once | INTRAVENOUS | Status: AC | PRN
  Administered 2023-09-29: 15 mL via INTRAVENOUS

## 2023-09-30 ENCOUNTER — Other Ambulatory Visit: Payer: Self-pay

## 2023-09-30 ENCOUNTER — Inpatient Hospital Stay (HOSPITAL_COMMUNITY)
Admission: EM | Admit: 2023-09-30 | Discharge: 2023-10-05 | DRG: 074 | Disposition: A | Discharge status: Rehabilitation Facility | Payer: 59 | Attending: Internal Medicine | Admitting: Internal Medicine

## 2023-09-30 ENCOUNTER — Other Ambulatory Visit: Payer: 59

## 2023-09-30 ENCOUNTER — Encounter: Payer: Self-pay | Admitting: Internal Medicine

## 2023-09-30 ENCOUNTER — Inpatient Hospital Stay (HOSPITAL_COMMUNITY): Payer: 59

## 2023-09-30 ENCOUNTER — Encounter (HOSPITAL_COMMUNITY): Payer: Self-pay

## 2023-09-30 ENCOUNTER — Emergency Department (HOSPITAL_COMMUNITY): Payer: 59

## 2023-09-30 ENCOUNTER — Ambulatory Visit (INDEPENDENT_AMBULATORY_CARE_PROVIDER_SITE_OTHER): Payer: 59 | Admitting: Neurology

## 2023-09-30 VITALS — BP 91/62 | HR 84 | Ht 67.0 in | Wt 184.0 lb

## 2023-09-30 DIAGNOSIS — F410 Panic disorder [episodic paroxysmal anxiety] without agoraphobia: Secondary | ICD-10-CM | POA: Diagnosis present

## 2023-09-30 DIAGNOSIS — F54 Psychological and behavioral factors associated with disorders or diseases classified elsewhere: Secondary | ICD-10-CM | POA: Diagnosis not present

## 2023-09-30 DIAGNOSIS — G6181 Chronic inflammatory demyelinating polyneuritis: Principal | ICD-10-CM | POA: Diagnosis present

## 2023-09-30 DIAGNOSIS — E785 Hyperlipidemia, unspecified: Secondary | ICD-10-CM | POA: Diagnosis present

## 2023-09-30 DIAGNOSIS — Z7982 Long term (current) use of aspirin: Secondary | ICD-10-CM | POA: Diagnosis not present

## 2023-09-30 DIAGNOSIS — E873 Alkalosis: Secondary | ICD-10-CM | POA: Diagnosis present

## 2023-09-30 DIAGNOSIS — R269 Unspecified abnormalities of gait and mobility: Secondary | ICD-10-CM

## 2023-09-30 DIAGNOSIS — Z0289 Encounter for other administrative examinations: Secondary | ICD-10-CM

## 2023-09-30 DIAGNOSIS — R5381 Other malaise: Secondary | ICD-10-CM | POA: Diagnosis not present

## 2023-09-30 DIAGNOSIS — E039 Hypothyroidism, unspecified: Secondary | ICD-10-CM | POA: Diagnosis present

## 2023-09-30 DIAGNOSIS — Z880 Allergy status to penicillin: Secondary | ICD-10-CM

## 2023-09-30 DIAGNOSIS — Z888 Allergy status to other drugs, medicaments and biological substances status: Secondary | ICD-10-CM

## 2023-09-30 DIAGNOSIS — Z79891 Long term (current) use of opiate analgesic: Secondary | ICD-10-CM

## 2023-09-30 DIAGNOSIS — G610 Guillain-Barre syndrome: Secondary | ICD-10-CM

## 2023-09-30 DIAGNOSIS — E871 Hypo-osmolality and hyponatremia: Secondary | ICD-10-CM | POA: Diagnosis present

## 2023-09-30 DIAGNOSIS — R29898 Other symptoms and signs involving the musculoskeletal system: Principal | ICD-10-CM

## 2023-09-30 DIAGNOSIS — Z833 Family history of diabetes mellitus: Secondary | ICD-10-CM | POA: Diagnosis not present

## 2023-09-30 DIAGNOSIS — R2689 Other abnormalities of gait and mobility: Secondary | ICD-10-CM

## 2023-09-30 DIAGNOSIS — I10 Essential (primary) hypertension: Secondary | ICD-10-CM | POA: Diagnosis present

## 2023-09-30 DIAGNOSIS — R292 Abnormal reflex: Secondary | ICD-10-CM | POA: Diagnosis not present

## 2023-09-30 DIAGNOSIS — R202 Paresthesia of skin: Secondary | ICD-10-CM

## 2023-09-30 DIAGNOSIS — G8929 Other chronic pain: Secondary | ICD-10-CM | POA: Diagnosis present

## 2023-09-30 DIAGNOSIS — R278 Other lack of coordination: Secondary | ICD-10-CM

## 2023-09-30 DIAGNOSIS — Z9842 Cataract extraction status, left eye: Secondary | ICD-10-CM

## 2023-09-30 DIAGNOSIS — Z8249 Family history of ischemic heart disease and other diseases of the circulatory system: Secondary | ICD-10-CM | POA: Diagnosis not present

## 2023-09-30 DIAGNOSIS — Z83438 Family history of other disorder of lipoprotein metabolism and other lipidemia: Secondary | ICD-10-CM | POA: Diagnosis not present

## 2023-09-30 DIAGNOSIS — Z9841 Cataract extraction status, right eye: Secondary | ICD-10-CM | POA: Diagnosis not present

## 2023-09-30 DIAGNOSIS — Z79899 Other long term (current) drug therapy: Secondary | ICD-10-CM

## 2023-09-30 DIAGNOSIS — Z825 Family history of asthma and other chronic lower respiratory diseases: Secondary | ICD-10-CM | POA: Diagnosis not present

## 2023-09-30 DIAGNOSIS — G894 Chronic pain syndrome: Secondary | ICD-10-CM | POA: Diagnosis present

## 2023-09-30 DIAGNOSIS — G8250 Quadriplegia, unspecified: Secondary | ICD-10-CM | POA: Diagnosis not present

## 2023-09-30 DIAGNOSIS — G629 Polyneuropathy, unspecified: Secondary | ICD-10-CM

## 2023-09-30 DIAGNOSIS — Z1152 Encounter for screening for COVID-19: Secondary | ICD-10-CM

## 2023-09-30 LAB — I-STAT VENOUS BLOOD GAS, ED
Acid-Base Excess: 3 mmol/L — ABNORMAL HIGH (ref 0.0–2.0)
Bicarbonate: 19.9 mmol/L — ABNORMAL LOW (ref 20.0–28.0)
Calcium, Ion: 0.79 mmol/L — CL (ref 1.15–1.40)
HCT: 44 % (ref 39.0–52.0)
Hemoglobin: 15 g/dL (ref 13.0–17.0)
O2 Saturation: 100 %
Potassium: 4.2 mmol/L (ref 3.5–5.1)
Sodium: 128 mmol/L — ABNORMAL LOW (ref 135–145)
TCO2: 20 mmol/L — ABNORMAL LOW (ref 22–32)
pCO2, Ven: 15.7 mm[Hg] — CL (ref 44–60)
pH, Ven: 7.712 (ref 7.25–7.43)
pO2, Ven: 200 mm[Hg] — ABNORMAL HIGH (ref 32–45)

## 2023-09-30 LAB — CBC WITH DIFFERENTIAL/PLATELET
Abs Immature Granulocytes: 0.04 10*3/uL (ref 0.00–0.07)
Basophils Absolute: 0.1 10*3/uL (ref 0.0–0.1)
Basophils Relative: 1 %
Eosinophils Absolute: 0.9 10*3/uL — ABNORMAL HIGH (ref 0.0–0.5)
Eosinophils Relative: 9 %
HCT: 44.7 % (ref 39.0–52.0)
Hemoglobin: 15.4 g/dL (ref 13.0–17.0)
Immature Granulocytes: 0 %
Lymphocytes Relative: 35 %
Lymphs Abs: 3.6 10*3/uL (ref 0.7–4.0)
MCH: 31.1 pg (ref 26.0–34.0)
MCHC: 34.5 g/dL (ref 30.0–36.0)
MCV: 90.3 fL (ref 80.0–100.0)
Monocytes Absolute: 1 10*3/uL (ref 0.1–1.0)
Monocytes Relative: 10 %
Neutro Abs: 4.7 10*3/uL (ref 1.7–7.7)
Neutrophils Relative %: 45 %
Platelets: 354 10*3/uL (ref 150–400)
RBC: 4.95 MIL/uL (ref 4.22–5.81)
RDW: 13.2 % (ref 11.5–15.5)
WBC: 10.4 10*3/uL (ref 4.0–10.5)
nRBC: 0 % (ref 0.0–0.2)

## 2023-09-30 LAB — BASIC METABOLIC PANEL
Anion gap: 14 (ref 5–15)
BUN: 8 mg/dL (ref 6–20)
CO2: 21 mmol/L — ABNORMAL LOW (ref 22–32)
Calcium: 9.7 mg/dL (ref 8.9–10.3)
Chloride: 97 mmol/L — ABNORMAL LOW (ref 98–111)
Creatinine, Ser: 0.78 mg/dL (ref 0.61–1.24)
GFR, Estimated: >60 mL/min (ref >60)
Glucose, Bld: 81 mg/dL (ref 70–99)
Potassium: 4.4 mmol/L (ref 3.5–5.1)
Sodium: 132 mmol/L — ABNORMAL LOW (ref 135–145)

## 2023-09-30 LAB — BRAIN NATRIURETIC PEPTIDE: B Natriuretic Peptide: 4.4 pg/mL (ref 0.0–100.0)

## 2023-09-30 LAB — RESP PANEL BY RT-PCR (RSV, FLU A&B, COVID)  RVPGX2
Influenza A by PCR: NEGATIVE
Influenza B by PCR: NEGATIVE
Resp Syncytial Virus by PCR: NEGATIVE
SARS Coronavirus 2 by RT PCR: NEGATIVE

## 2023-09-30 LAB — I-STAT CG4 LACTIC ACID, ED: Lactic Acid, Venous: 1.7 mmol/L (ref 0.5–1.9)

## 2023-09-30 MED ORDER — IMMUNE GLOBULIN (HUMAN) 10 GM/100ML IV SOLN
400.0000 mg/kg | INTRAVENOUS | Status: AC
Start: 2023-09-30 — End: 2023-10-03
  Administered 2023-09-30 – 2023-10-03 (×5): 25 g via INTRAVENOUS
  Filled 2023-09-30 (×4): qty 250

## 2023-09-30 MED ORDER — ENOXAPARIN SODIUM 40 MG/0.4ML IJ SOSY
40.0000 mg | PREFILLED_SYRINGE | INTRAMUSCULAR | Status: DC
  Administered 2023-09-30 – 2023-10-04 (×5): 40 mg via SUBCUTANEOUS
  Filled 2023-09-30 (×5): qty 0.4

## 2023-09-30 MED ORDER — ACETAMINOPHEN 500 MG PO TABS: 1000.0000 mg | ORAL_TABLET | Freq: Once | ORAL | Status: DC

## 2023-09-30 MED ORDER — SODIUM CHLORIDE 0.9% FLUSH
3.0000 mL | Freq: Two times a day (BID) | INTRAVENOUS | Status: DC
  Administered 2023-09-30 – 2023-10-05 (×10): 3 mL via INTRAVENOUS

## 2023-09-30 MED ORDER — LEVOTHYROXINE SODIUM 25 MCG PO TABS
125.0000 ug | ORAL_TABLET | Freq: Every day | ORAL | Status: DC
  Administered 2023-10-01 – 2023-10-05 (×5): 125 ug via ORAL
  Filled 2023-09-30 (×5): qty 1

## 2023-09-30 MED ORDER — ACETAMINOPHEN 650 MG RE SUPP: 650.0000 mg | Freq: Four times a day (QID) | RECTAL | Status: DC | PRN

## 2023-09-30 MED ORDER — OXYCODONE-ACETAMINOPHEN 5-325 MG PO TABS
1.0000 | ORAL_TABLET | Freq: Three times a day (TID) | ORAL | Status: DC | PRN
  Administered 2023-09-30 – 2023-10-05 (×13): 1 via ORAL
  Filled 2023-09-30 (×13): qty 1

## 2023-09-30 MED ORDER — POLYETHYLENE GLYCOL 3350 17 G PO PACK: 17.0000 g | PACK | Freq: Every day | ORAL | Status: DC | PRN

## 2023-09-30 MED ORDER — METOPROLOL SUCCINATE ER 25 MG PO TB24: 25.0000 mg | ORAL_TABLET | Freq: Every day | ORAL | Status: DC

## 2023-09-30 MED ORDER — OXYCODONE HCL 5 MG PO TABS
5.0000 mg | ORAL_TABLET | Freq: Once | ORAL | Status: AC
  Administered 2023-09-30: 5 mg via ORAL
  Filled 2023-09-30: qty 1

## 2023-09-30 MED ORDER — DULOXETINE HCL 30 MG PO CPEP: 60.0000 mg | ORAL_CAPSULE | Freq: Every day | ORAL | Status: DC

## 2023-09-30 MED ORDER — SERTRALINE HCL 25 MG PO TABS
25.0000 mg | ORAL_TABLET | Freq: Every day | ORAL | Status: DC
  Administered 2023-10-01 – 2023-10-05 (×5): 25 mg via ORAL
  Filled 2023-09-30 (×5): qty 1

## 2023-09-30 MED ORDER — CLONAZEPAM 0.5 MG PO TABS
0.5000 mg | ORAL_TABLET | Freq: Once | ORAL | Status: AC
  Administered 2023-09-30: 0.5 mg via ORAL
  Filled 2023-09-30: qty 1

## 2023-09-30 MED ORDER — ACETAMINOPHEN 325 MG PO TABS
650.0000 mg | ORAL_TABLET | Freq: Four times a day (QID) | ORAL | Status: DC | PRN
Start: 2023-09-30 — End: 2023-10-05

## 2023-09-30 NOTE — Consult Note (Addendum)
NEUROLOGY CONSULT NOTE   Date of service: September 30, 2023 Patient Name: Jonathan Luna MRN:  [id number] DOB:  05-03-1966 Chief Complaint: "Ascending weakness and numbness" Requesting Provider: No att. providers found  History of Present Illness  Jonathan Luna is a 57 y.o. male  has a past medical history of Allergy, Anxiety, Arthritis, Cataract, Depression, Detached retina, Heart murmur, History of bilateral cataract extraction, Hyperlipidemia, Hypothyroidism, Low back pain, Panic attack, and Panic attacks.   Patient presents from his neurologist office for IVIG therapy for CIDP.  Patient reports that he got pneumonia in mid to late October and had multiple courses of antibiotics.  During that time, he started to have some weakness starting in his lower legs with his legs buckling at times when he walks.  This was initially attributed to his pneumonia.  He also noted numbness starting in his feet and ascending upwards as well as some numbness in the palms of the hands.  He feels that the weakness has progressed and now it is very difficult for him to ambulate without his legs buckling.  He states that his fingers are not very strong and that it is often difficult to keep his wrist straight when he is holding objects such as a drink bottle.  He reports no difficulty with swallowing but states that he gets out of breath with even mild activity such as taking a shower or walking to his kitchen.  He is, however, able to hold a conversation without running out of breath.   ROS  Comprehensive ROS performed and pertinent positives documented in HPI   Past History   Past Medical History:  Diagnosis Date   Allergy    Anxiety    Arthritis    Cataract    Depression    Detached retina    Heart murmur    History of bilateral cataract extraction    Hyperlipidemia    Hypothyroidism    Low back pain    Panic attack    Panic attacks     Past Surgical History:  Procedure Laterality Date   CATARACT  EXTRACTION     EYE SURGERY      Family History: Family History  Problem Relation Age of Onset   Breast cancer Mother    Hypertension Father    Heart disease Father    Asthma Father    Emphysema Father        smoker for 45 years   Diabetes Brother    Hyperlipidemia Brother     Social History  reports that he has never smoked. He has been exposed to tobacco smoke. He has never used smokeless tobacco. He reports that he does not currently use alcohol. He reports that he does not use drugs.  Allergies  Allergen Reactions   Amoxicillin Other (See Comments)    Pt states that it does not work for him when he had back to back pneumonia   Effexor [Venlafaxine] Diarrhea   Fluoxetine Hcl    Prozac [Fluoxetine Hcl] Diarrhea    Medications  No current facility-administered medications for this encounter.  Current Outpatient Medications:    aspirin EC 81 MG tablet, Take 81 mg by mouth daily. Swallow whole., Disp: , Rfl:    Cholecalciferol (VITAMIN D-3) 25 MCG (1000 UT) CAPS, Take 1 capsule by mouth daily., Disp: , Rfl:    clonazePAM (KLONOPIN) 0.5 MG tablet, Take 1 tablet (0.5 mg total) by mouth 2 (two) times daily as needed for anxiety., Disp: 60  tablet, Rfl: 1   DULoxetine (CYMBALTA) 60 MG capsule, Take 1 capsule (60 mg total) by mouth daily., Disp: 30 capsule, Rfl: 11   fluticasone (FLONASE) 50 MCG/ACT nasal spray, Place 2 sprays into both nostrils daily. (Patient not taking: Reported on 09/28/2023), Disp: 48 g, Rfl: 1   gabapentin (NEURONTIN) 300 MG capsule, Take 2 capsules (600 mg total) by mouth 3 (three) times daily., Disp: 180 capsule, Rfl: 11   indapamide (LOZOL) 1.25 MG tablet, Take 1 tablet (1.25 mg total) by mouth daily., Disp: 90 tablet, Rfl: 0   irbesartan (AVAPRO) 300 MG tablet, Take 1 tablet (300 mg total) by mouth daily., Disp: 90 tablet, Rfl: 0   metoprolol succinate (TOPROL XL) 25 MG 24 hr tablet, Take 1 tablet (25 mg total) by mouth daily., Disp: 90 tablet, Rfl: 0    Multiple Vitamins-Minerals (ZINC PO), Take by mouth., Disp: , Rfl:    oxyCODONE-acetaminophen (PERCOCET/ROXICET) 5-325 MG tablet, Take 1 tablet by mouth every 8 (eight) hours as needed for severe pain (pain score 7-10)., Disp: 90 tablet, Rfl: 0   sertraline (ZOLOFT) 25 MG tablet, Take 1 tablet (25 mg total) by mouth daily., Disp: 30 tablet, Rfl: 0   UNITHROID 125 MCG tablet, Take 1 tablet (125 mcg total) by mouth daily before breakfast., Disp: 90 tablet, Rfl: 0  Vitals   Vitals:   09/30/23 1350 09/30/23 1357  BP: 110/87   Pulse: 83   Resp: 18   Temp: 97.6 F (36.4 C)   SpO2: 99%   Weight:  83.9 kg  Height:  5\' 7"  (1.702 m)    Body mass index is 28.98 kg/m.  Physical Exam   Constitutional: Ill-appearing, well-developed, well-nourished patient in no acute distress Psych: Affect appropriate to situation.  Eyes: No scleral injection.  Pupils irregular HENT: No OP obstruction.  Head: Normocephalic.  Respiratory: Effort normal, non-labored breathing.  Skin: WDI.   Neurologic Examination    NEURO:  Mental Status: Alert and oriented to person place time and situation, able to give clear and coherent history of present illness Speech/Language: speech is without dysarthria or aphasia.    Cranial Nerves:  II: Pupils irregular but reactive III, IV, VI: EOMI. Eyelids elevate symmetrically.  V: Sensation is intact to light touch and symmetrical to face.  VII: Smile is symmetrical.  VIII: hearing intact to voice. IX, X: Phonation is normal.  WG:[id number] shrug 4/5. XII: tongue is midline without fasciculations. Motor: 4+/5 neck flexion and extension, 4/5 strength shoulder abduction bilaterally, 4 -/5 strength elbow extension and elbow flexion, 3/5 strength wrist extension, 4 -/5 strength wrist flexion, 3/5 strength finger abduction, 4/5 strength hip flexion bilaterally, 4/5 strength with knee flexion, 4 -/5 strength knee extension, 4/5 strength dorsiflexion, 4 -/5 strength  plantarflexion Tone: is normal and bulk is normal Sensation-intact to light touch but diminished in the palms of the hands and diminished in the feet and ankles, becoming more normal around the knees  DTRs: Areflexic Gait- deferred    Labs/Imaging/Neurodiagnostic studies   CBC: No results for input(s): "WBC", "NEUTROABS", "HGB", "HCT", "MCV", "PLT" in the last 168 hours. Basic Metabolic Panel:  Lab Results  Component Value Date   NA 138 08/30/2023   K 3.7 08/30/2023   CO2 22 08/30/2023   GLUCOSE 96 08/30/2023   BUN 8 08/30/2023   CREATININE 0.76 08/30/2023   CALCIUM 9.1 08/30/2023   GFRNONAA >60 08/30/2023   GFRAA 94 08/16/2008   Lipid Panel:  Lab Results  Component Value Date  LDLCALC 104 (H) 01/26/2023   HgbA1c:  Lab Results  Component Value Date   HGBA1C 5.3 09/22/2023   Urine Drug Screen:     Component Value Date/Time   LABOPIA NEG 09/29/2012 0830   COCAINSCRNUR Negative 08/18/2023 1114   COCAINSCRNUR NEG 09/29/2012 0830   LABBENZ POS (A) 09/29/2012 0830   AMPHETMU NEG 09/29/2012 0830    Alcohol Level No results found for: "ETH" INR  Lab Results  Component Value Date   INR 1.0 08/29/2023   APTT  Lab Results  Component Value Date   APTT 32 08/29/2023   AED levels: No results found for: "PHENYTOIN", "ZONISAMIDE", "LAMOTRIGINE", "LEVETIRACETA"  MRI Brain(Personally reviewed): No acute abnormality  MRI cervical, thoracic and lumbar spine: Some minor disc degenerative changes but no significant spinal cord compression, most prominent at L4-L5 with bilateral foraminal narrowing and possible encroachment on nerve roots  ASSESSMENT   CAMIEL GREGER is a 57 y.o. male  has a past medical history of Allergy, Anxiety, Arthritis, Cataract, Depression, Detached retina, Heart murmur, History of bilateral cataract extraction, Hyperlipidemia, Hypothyroidism, Low back pain, Panic attack, and Panic attacks.  Patient presents from his neurologist's office to receive IVIG  therapy for CIDP.  He has ascending weakness and numbness in the arms and legs which started in mid October when he had pneumonia and was on several rounds of antibiotics.  Symptoms have worsened since then, and patient does have some shortness of breath with even mild activity, such as walking to the kitchen.  He is unable to ambulate without his legs buckling.  Exam is very typical of CIDP but diminished sensation in the feet and palms of the hands becoming more normal as he is closer to the trunk.  Ascending weakness is also noted.  Will need to monitor NIF and vital capacity closely every 6 hours.  Will start IVIG as soon as possible.  RECOMMENDATIONS  -IVIG 400 mg/kg x 5 doses -NIF and vital capacity every 6 hours -Admit to progressive unit for close monitoring -PT/OT evaluation ______________________________________________________________________    Signed, Cortney Harland Dingwall, NP [facility]    Attending Neurohospitalist Addendum Patient seen and examined with APP/Resident. Agree with the history and physical as documented above. Agree with the plan as documented, which I helped formulate. I have edited the note above to reflect my full findings and recommendations. I have independently reviewed the chart, obtained history, review of systems and examined the patient.I have personally reviewed pertinent head/neck/spine imaging (CT/MRI). Please feel free to call with any questions.  Patient sent from Dr. Zannie Cove office for inpatient IVIG for worsening CIDP. I agree with her assessment and plan. Start IVIG x5 days end date 12/22. Will continue to follow.  -- Bing Neighbors, MD [facility] [phone]  If 7pm- 7am, please page neurology on call as listed in [facility].

## 2023-09-30 NOTE — Patient Instructions (Signed)

## 2023-09-30 NOTE — Progress Notes (Signed)
NIF -40 VC 4.45 L  With good patient effort

## 2023-09-30 NOTE — Progress Notes (Signed)
NIF: -40 VC: 4.46 L With good patient effort

## 2023-09-30 NOTE — Procedures (Signed)
Full Name: Jonathan Luna Gender: Male MRN #: [id number] Date of Birth: [birth year]    Visit Date: 09/30/2023 09:28 Age: 57 Years Examining Physician: Dr. Levert Feinstein Referring Physician: Dr. Levert Feinstein Height: 5 feet 7 inch History: 57 year old male presenting with subacute onset of gait abnormality in October 2024  Summary of the test:  Nerve conduction study: Bilateral sural, superficial peroneal sensory responses were within normal limit, with upper limit of peak latency.  Left radial sensory responses were normal.  Left ulnar, medial sensory responses showed mildly decreased snap amplitude.  Bilateral tibial motor responses were normal at distal stimulation site, with evidence of temporal dispersion at proximal stimulation site.  Bilateral tibial F-wave latency was prolonged.  Bilateral peroneal to EDB motor response showed significantly decreased CMAP amplitude.  Left ulnar, median motor responses showed evidence of decreased CMAP amplitude, evidence of temporal dispersion at proximal stimulation site, with upper limit of left ulnar F-wave latency.  Electromyography: Selected needle examinations were performed at left lower extremity muscles, lumbosacral paraspinal muscles;, left upper extremity muscles and left cervical paraspinal muscles.  There is no evidence of active denervation, but there is evidence of decreased recruitment patterns at all the muscle tested.  There is noticeable polyphasic changes at left cervical and lumbar paraspinal muscles.   Conclusion: This is an abnormal study.  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  There is no evidence of axonal loss.   Levert Feinstein. M.D. Ph.D.   [facility] [address] [place], [place] [address] Tel: [phone] Fax: [phone]  Verbal informed consent was obtained from the patient, patient was informed of potential risk of procedure, including  bruising, bleeding, hematoma formation, infection, muscle weakness, muscle pain, numbness, among others.        MNC    Nerve / Sites Muscle Latency Ref. Amplitude Ref. Rel Amp Segments Distance Velocity Ref. Area    ms ms mV mV %  cm m/s m/s mVms  L Median - APB     Wrist APB 4.0 <=4.4 3.2 >=4.0 100 Wrist - APB 7   8.8     Upper arm APB 8.3  1.7  52 Upper arm - Wrist 20 46 >=49 5.5  L Ulnar - ADM     Wrist ADM 3.0 <=3.3 3.8 >=6.0 100 Wrist - ADM 7   15.1     B.Elbow ADM 4.6  1.2  31 B.Elbow - Wrist 11 69 >=49 3.4     A.Elbow ADM 8.3  2.9  247 A.Elbow - B.Elbow 19 52 >=49 13.3  L Peroneal - EDB     Ankle EDB 7.0 <=6.5 0.9 >=2.0 100 Ankle - EDB 9   3.0     Fib head EDB 14.4  0.5  63.7 Fib head - Ankle 24 32 >=44 1.7     Pop fossa EDB 17.1  0.6  112 Pop fossa - Fib head 12 45 >=44 1.5         Pop fossa - Ankle      R Peroneal - EDB     Ankle EDB 7.5 <=6.5 0.8 >=2.0 100 Ankle - EDB 9   2.2     Fib head EDB 13.3  0.5  64.9 Fib head - Ankle 23.6 40 >=44 2.5     Pop fossa EDB 15.1  0.7  152 Pop fossa - Fib head 10.6 60 >=44 3.4         Pop fossa -  Ankle      L Tibial - AH     Ankle AH 4.9 <=5.8 4.1 >=4.0 100 Ankle - AH 9   14.8     Pop fossa AH 13.6  0.8  20.8 Pop fossa - Ankle 37 42 >=41 3.7  R Tibial - AH     Ankle AH 5.2 <=5.8 4.6 >=4.0 100 Ankle - AH 9   21.8     Pop fossa AH 15.7  1.4  30.1 Pop fossa - Ankle 37 35 >=41 4.8                 SNC    Nerve / Sites Rec. Site Peak Lat Ref.  Amp Ref. Segments Distance    ms ms V V  cm  L Radial - Anatomical snuff box (Forearm)     Forearm Wrist 2.3 <=2.9 16 >=15 Forearm - Wrist 10  L Sural - Ankle (Calf)     Calf Ankle 4.2 <=4.4 8 >=6 Calf - Ankle 14  R Sural - Ankle (Calf)     Calf Ankle 4.4 <=4.4 6 >=6 Calf - Ankle 14  L Superficial peroneal - Ankle     Lat leg Ankle 4.2 <=4.4 10 >=6 Lat leg - Ankle 14  R Superficial peroneal - Ankle     Lat leg Ankle 4.5 <=4.4 4 >=6 Lat leg - Ankle 14  L Median - Orthodromic (Dig II, Mid  palm)     Dig II Wrist 3.1 <=3.4 4 >=10 Dig II - Wrist 13  L Ulnar - Orthodromic, (Dig V, Mid palm)     Dig V Wrist 2.8 <=3.1 3 >=5 Dig V - Wrist 10                   F  Wave    Nerve F Lat Ref.   ms ms  L Tibial - AH 60.5 <=56.0  R Tibial - AH 61.0 <=56.0  L Ulnar - ADM 31.3 <=32.0  L Median - APB 31.9 <=31.0             EMG Summary Table    Spontaneous MUAP Recruitment  Muscle IA Fib PSW Fasc Other Amp Dur. Poly Pattern  L. Tibialis anterior Increased None None None _______ Increased Increased 1+ Reduced  L. Peroneus longus Increased None None None _______ Increased Increased 1+ Reduced  L. Gastrocnemius (Medial head) Increased None None None _______ Increased Increased 1+ Reduced  L. Vastus lateralis Increased None None None _______ Increased Increased 1+ Reduced  L. First dorsal interosseous Increased None None None _______ Increased Increased 1+ Reduced  L. Extensor digitorum communis Increased None None None _______ Increased Increased 1+ Reduced  L. Biceps brachii Increased None None None _______ Increased Increased 1+ Reduced  L. Deltoid Increased None None None _______ Increased Increased 1+ Reduced  L. Pronator teres Increased None None None _______ Increased Increased 1+ Reduced  L. Cervical paraspinals Normal None None None _______ Normal Normal Normal Normal  L. Lumbar paraspinals (low) Normal None None None _______ Increased Increased 1+ Normal  L. Lumbar paraspinals (mid) Increased None None None _______ Increased Increased 2+ Normal

## 2023-09-30 NOTE — ED Provider Notes (Addendum)
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  Per chart review, patient had an episode of pneumonia in October.  In late November he began to feel "electricity" sensation in his body particularly in his legs and feet.  He also had complained of stiffness in his wrist and hands going up his arms.  He is experience legs buckling and falling as well as leg weakness send on 09/28/2023 saw internal medicine and requested a wheelchair due to mobility issues. Has fallen 5 times in the last three weeks. Doesn't report pain anywhere from the falls. Also complaining of continued cough and mucus production not significantly improved by Mucinex.  Today he was seen in neurology clinic where he was noted to have mild to moderate truncal ataxia and limb dysmetria with wide-based unsteady gait and absent lower extremity reflexes.  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  Patient does report dyspnea on exertion with walking and bending over; doesn't feel as though he is breathing as well as usual but doesn't feel short of breath now. No CP. No lightheadedness, LOC, f/c.    Past Medical History:  Diagnosis Date   Allergy    Anxiety    Arthritis    Cataract    Depression    Detached retina    Heart murmur    History of bilateral cataract extraction    Hyperlipidemia    Hypothyroidism    Low back pain    Panic attack    Panic attacks        Home Medications Prior to Admission medications   Medication Sig Start Date End Date Taking? Authorizing Provider  aspirin EC 81 MG tablet Take 81 mg by  mouth daily. Swallow whole.    [provider]  Cholecalciferol (VITAMIN D-3) 25 MCG (1000 UT) CAPS Take 1 capsule by mouth daily.    [provider]  clonazePAM (KLONOPIN) 0.5 MG tablet Take 1 tablet (0.5 mg total) by mouth 2 (two) times daily as needed for anxiety. 09/18/23   [name], MD  DULoxetine (CYMBALTA) 60 MG capsule Take 1 capsule (60 mg total) by mouth daily. 09/22/23   Levert Feinstein, MD  fluticasone (FLONASE) 50 MCG/ACT nasal spray Place 2 sprays into both nostrils daily. Patient not taking: Reported on 09/28/2023 08/18/23   [name], MD  gabapentin (NEURONTIN) 300 MG capsule Take 2 capsules (600 mg total) by mouth 3 (three) times daily. 09/22/23   Levert Feinstein, MD  indapamide (LOZOL) 1.25 MG tablet Take 1 tablet (1.25 mg total) by mouth daily. 05/27/23   [name], MD  irbesartan (AVAPRO) 300 MG tablet Take 1 tablet (300 mg total) by mouth daily. 09/04/23   [name], MD  metoprolol succinate (TOPROL XL) 25 MG 24 hr tablet Take 1 tablet (25 mg total) by mouth daily. 09/04/23   [name], MD  Multiple Vitamins-Minerals (ZINC PO) Take by mouth.    [provider]  oxyCODONE-acetaminophen (PERCOCET/ROXICET) 5-325 MG tablet Take 1 tablet by mouth every 8 (eight) hours as needed for severe  pain (pain score 7-10). 09/04/23   [name], MD  sertraline (ZOLOFT) 25 MG tablet Take 1 tablet (25 mg total) by mouth daily. 09/04/23   [name], MD  UNITHROID 125 MCG tablet Take 1 tablet (125 mcg total) by mouth daily before breakfast. 09/09/23   [name], MD      Allergies    Amoxicillin, Effexor [venlafaxine], Fluoxetine hcl, and Prozac [fluoxetine hcl]    Review of Systems   Review of Systems A 10 point review of systems was performed and is negative unless otherwise reported in HPI.  Physical Exam Updated Vital Signs BP 123/76   Pulse 77   Temp 97.6 F (36.4 C)   Resp 13   Ht 5\' 7"  (1.702 m)   Wt 83.9 kg   SpO2  99%   BMI 28.98 kg/m  Physical Exam General: Normal appearing male, lying in bed.  HEENT: PERRLA, EOMI, Sclera anicteric, MMM, trachea midline.  Cardiology: RRR, no murmurs/rubs/gallops. BL radial and DP pulses equal bilaterally.  Resp: Normal respiratory rate and effort. CTAB, no wheezes, rhonchi, crackles.  Abd: Soft, non-tender, non-distended. No rebound tenderness or guarding.  GU: Deferred. MSK: No peripheral edema or signs of trauma. Extremities without deformity or TTP. No cyanosis or clubbing. Skin: warm, dry.  Neuro: A&Ox4, CNs II-XII grossly intact. 4+/5 strength in BL UEs/LEs. LE areflexia. BL LE ataxia noted. Significantly unsteady, wide-based gait with truncal ataxia.  Sensation grossly intact. Normal speech.  Psych: Normal mood and affect.   ED Results / Procedures / Treatments   Labs (all labs ordered are listed, but only abnormal results are displayed) Labs Reviewed  CBC WITH DIFFERENTIAL/PLATELET - Abnormal; Notable for the following components:      Result Value   Eosinophils Absolute 0.9 (*)    All other components within normal limits  BASIC METABOLIC PANEL - Abnormal; Notable for the following components:   Sodium 132 (*)    Chloride 97 (*)    CO2 21 (*)    All other components within normal limits  I-STAT VENOUS BLOOD GAS, ED - Abnormal; Notable for the following components:   pH, Ven 7.712 (*)    pCO2, Ven 15.7 (*)    pO2, Ven 200 (*)    Bicarbonate 19.9 (*)    TCO2 20 (*)    Acid-Base Excess 3.0 (*)    Sodium 128 (*)    Calcium, Ion 0.79 (*)    All other components within normal limits  RESP PANEL BY RT-PCR (RSV, FLU A&B, COVID)  RVPGX2  BRAIN NATRIURETIC PEPTIDE  I-STAT CG4 LACTIC ACID, ED  I-STAT VENOUS BLOOD GAS, ED    EKG None  Radiology DG Chest Portable 1 View Result Date: 09/30/2023 CLINICAL DATA:  Dyspnea on exertion EXAM: PORTABLE CHEST 1 VIEW COMPARISON:  Chest radiograph dated 10/29/2022. CTA chest dated 08/28/2023. FINDINGS:  Persistent right upper lobe opacity, possibly improving infection/pneumonia, but underlying lesion/mass is not excluded. Consider CT chest for further evaluation. Left lung is clear.  No pleural effusion or pneumothorax. The heart is normal in size. IMPRESSION: Persistent right upper lobe opacity, possibly improving infection/pneumonia, but underlying lesion/mass is not excluded. Consider CT chest for further evaluation. Electronically Signed   By: Charline Bills M.D.   On: 09/30/2023 16:33   MR Lumbar Spine W Wo Contrast Result Date: 09/30/2023  [facility] [address] [place], [place] [address] [phone] NEUROIMAGING REPORT STUDY DATE: 09/29/2023 PATIENT NAME: GENEVA LEAVELLE DOB: 09-05-1966  MRN: [id number] ORDERING CLINICIAN: Dr. Terrace Arabia CLINICAL HISTORY: 57 year old patient being evaluated for gait abnormality COMPARISON FILMS: None EXAM: MRI lumbar spine with and without contrast TECHNIQUE: Sagittal T1, T2, STIR, axial T1, T2 and postcontrast sagittal and axial T1 images were obtained through lumbar spine CONTRAST: 15 mL IV MultiHance IMAGING SITE: [facility] at [address] FINDINGS: The lumbar vertebrae demonstrate loss of forward lordotic curvature with mild posterior subluxation of L3 over L2 vertebrae.  There are prominent disc degenerative changes throughout with endplate degenerative changes at L1-L2, L2/3 and L3-4. L1-L2 shows loss of disc height with degenerative change along with facet hypertrophy but no significant compression. . L2-3 also shows prominent disc degenerative change and endplate degenerative change along with facet hypertrophy resulting in mild left greater than right foraminal narrowing but no definite compression. L3-4 shows mild disc and prominent endplate degenerative change along with facet hypertrophy but no definite compression. L4-5 shows mild disc and prominent facet degenerative changes resulting in moderate right and mild left-sided foraminal  narrowing with only possible slight encroachment on exiting nerve roots L5-S1 shows mild disc and prominent facet degenerative change but no significant compression. The conus medullaris terminates at L1.  Paraspinal soft tissue appear unremarkable.  Visualized portion lower thoracic vertebrae also show minor disc degenerative changes.  Postcontrast images do not result in abnormal areas of enhancement   MRI scan of the lumbar spine with and without contrast showing prominent disc and facet degenerative changes throughout most prominent at L4-5 but there is moderate right and mild left-sided foraminal narrowing possible encroachment on exiting nerve roots. INTERPRETING PHYSICIAN: PRAMOD SETHI, MD Certified in  Neuroimaging by American Society of Neuroimaging and [employer] Neurological Subspecialities  MR THORACIC SPINE W WO CONTRAST Result Date: 09/30/2023  [facility] [address], [address] [place], [place] [address] [phone] NEUROIMAGING REPORT STUDY DATE: 09/29/2023 PATIENT NAME: JAQUAN COLLIVER DOB: 30-May-1966 MRN: [id number] ORDERING CLINICIAN: Dr. Terrace Arabia CLINICAL HISTORY: 57 year old patient being evaluated for gait abnormality COMPARISON FILMS: None EXAM: MRI thoracic spine with and without contrast TECHNIQUE: Sagittal T1, T2, STIR, axial T1, T2 and postcontrast axial and sagittal T1 images were obtained through thoracic spine CONTRAST: 15 mL IV MultiHance IMAGING SITE: [facility] at [address] FINDINGS: The thoracic vertebrae demonstrate normal alignment and body height and marrow signal characteristics except at T8-9 and T9-10 where there are mild endplate marrow degenerative changes.  There mild disc signal abnormalities but there is no frank disc herniation, cord compression significant root or foraminal encroachment.  Paraspinal soft tissue appear unremarkable.  Postcontrast images do not result in abnormal areas of enhancement.  Spinal cord parenchyma shows no abnormal  signal intensities.  Visualized portion of the lower cervical and upper lumbar vertebrae also show minor disc degenerative changes.   MRI scan of thoracic spine with and without contrast showing only minor disc and endplate degenerative changes without significant compression. INTERPRETING PHYSICIAN: Delia Heady, MD Certified in  Neuroimaging by American Society of Neuroimaging and [place] [employer] for Neurological Subspecialities   Procedures Procedures    Medications Ordered in ED Medications  Immune Globulin 10% (PRIVIGEN) IV infusion 25 g ( Intravenous Rate/Dose Change 09/30/23 1647)    ED Course/ Medical Decision Making/ A&P                          Medical Decision Making Amount and/or Complexity of Data Reviewed Labs: ordered. Decision-making details documented in ED Course. Radiology: ordered.  Risk OTC drugs. Prescription drug management. Decision regarding hospitalization.    This patient presents to the ED for concern of LE weakness, ataxia, c/f AIDP; this involves an extensive number of treatment options, and is a complaint that carries with it a high risk of complications and morbidity.  I considered the following differential and admission for this acute, potentially life threatening condition.   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 Also consider electrolyte derangements, hypokalemic periodic paralysis. Brain/C-spine MRI neg previously. No UPN sxs. Also consider tick disease or MG. Discussed with patient at length about presumed diagnosis and possible complications of clinical course.   Does complain of continued cough. Will get CXR to r/o PNA. Consider continued cough/bronchitis, viral syndrome. He does not appear SOB and appears to have good resp force, will get NIF to ensure.   Neurology already saw patient and ordered IVIG. Planning for admission to stepdown.   Clinical Course as of 09/30/23 1706  Wed Sep 30, 2023  1609  WBC: 10.4 No leukocytosis or anemia [HN]  1634 I-Stat venous blood gas, ([facility] ED, [facility], [facility])(!!) Will repeat. NIF -40, not consistent with clinical status.  [HN]  1703 B Natriuretic Peptide: 4.4 [HN]    Clinical Course User Index [HN] Loetta Rough, MD    Labs: I Ordered, and personally interpreted labs.  The pertinent results include:  those listed above  Additional history obtained from chart review.    Social Determinants of Health: Lives independently  Disposition:  Admitted to stepdown with neurology following  Co morbidities that complicate the patient evaluation  Past Medical History:  Diagnosis Date   Allergy    Anxiety    Arthritis    Cataract    Depression    Detached retina    Heart murmur    History of bilateral cataract extraction    Hyperlipidemia    Hypothyroidism    Low back pain    Panic attack    Panic attacks      Medicines Meds ordered this encounter  Medications   Immune Globulin 10% (PRIVIGEN) IV infusion 25 g    I have reviewed the patients home medicines and have made adjustments as needed  Problem List / ED Course: Problem List Items Addressed This Visit   None Visit Diagnoses       Weakness of both lower extremities    -  Primary     Areflexia         Truncal ataxia         Weakness of both upper extremities               Loetta Rough, MD 09/30/23 1735

## 2023-09-30 NOTE — Progress Notes (Signed)
ASSESSMENT AND PLAN  Jonathan Luna is a 57 y.o. 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   MEDICAL HISTORY:  Jonathan Luna is a 57 year old male accompanied by his wife, seen in request by his primary care doctor Sanda Linger for evaluation of rapid onset gait abnormality, initial evaluation September 22, 2023  History is obtained from the patient and review of electronic medical records. I personally reviewed pertinent available imaging films in PACS.   PMHx of  Depression, Anxiety Chronic low back pain, on percocet 5/325 tid by Dr. Yetta Barre HTN Pneumonia Hereditary cataract,  Left retinal detachment,  Right blood vessel   He suffered upper respiratory symptoms, pneumonia, was treated with antibiotic in the middle of October 2024.  Azithromycin, subsequently Augmentin  Few days later, he began to noticed numbness tingling starting at the bottom of his feet, toes, also burning sensation, by August 10, 2023, he noticed mild bilateral hands paresthesia, then had gradual onset gait abnormality, by November 11, when he walked to the car,  he noticed unsteady gait,  His gait abnormality continue to getting worse, fell few times, lower extremity buckled underneath him, then around November 15, he began to have upper extremity symptoms, feel weak, shaky when holding object, shallow breathing, out of breath easily  She presented to emergency room August 28, 2023, heart rate documented 127, temperature of 101, blood pressure 157/100, was diagnosed with sepsis due to right upper lobe pneumonia, D-dimer was positive, PE protocol was negative, which CT chest showed posterior right upper lobe airspace and groundglass opacity, respiratory virus panel was negative  He was treated with IV ceftriaxone, azithromycin, followed by p.o. doxycycline, cefdinir, his upper respiratory symptoms has much improved, however, since discharge, he had persistent gait abnormality, weird achy pain involving upper and lower extremity, hypersensitivity of the neck running up and down his spine, twitching inside his body,  He drove himself, shop at [facility] on September 04, 2023, felt such gait abnormality, stiff weak lower extremity, shortness of breath, by the time he left [employer], he have to rely on a cart, to pull himself, see since then he relied on his wheelchair, has difficulty bearing weight  UPDATE September 30, 2023: He continues to have profound gait abnormality, no improvement  MRI of the brain  showed no significant abnormality  MRI of cervical and thoracic spine showed mild degenerative changes, no cord or nerve root compression, MRI lumbar spine showed prominent disc and facet degenerative changes, throughout, most prominent L4-5, moderate right, mild foraminal narrowing  Extensive laboratory evaluations showed elevated TSH, otherwise normal negative HIV, RPR, vitamin D, ANA A1c ESR C-reactive protein Lyme titer, vitamin D, protein electrophoresis, Lyme titer,   PHYSICAL EXAM:      09/30/2023    5:30 PM  09/30/2023    5:26 PM 09/30/2023    5:00  PM  Vitals with BMI  Systolic 115 132 213  Diastolic 73 92 79  Pulse 80  85     PHYSICAL EXAMNIATION:  Gen: NAD, conversant, well nourised, well groomed                     Cardiovascular: Regular rate rhythm, no peripheral edema, warm, nontender. Eyes: Conjunctivae clear without exudates or hemorrhage Neck: Supple, no carotid bruits. Pulmonary: Clear to auscultation bilaterally   NEUROLOGICAL EXAM:  MENTAL STATUS: Speech/cognition: Awake, alert, oriented to history taking and casual conversation CRANIAL NERVES: CN II: Visual fields are full to confrontation.  Postsurgical irregular pupil  CN III, IV, VI: extraocular movement are normal. No ptosis. CN V: Facial sensation is intact to light touch CN VII: Face is symmetric with normal eye closure  CN VIII: Hearing is normal to causal conversation. CN IX, X: Phonation is normal. CN XI: Head turning and shoulder shrug are intact  MOTOR: Felt to there was no significant weakness, but motor examination somewhat limited by his dysmetrias/action tremor, mild bilateral hip flexion, ankle dorsiflexion weakness  REFLEXES: Areflexia  SENSORY: Well-preserved toe vibratory sensation, proprioception, pinprick, allodynia of bilateral feet plantar surface  COORDINATION: Moderate truncal ataxia, mild to moderate bilateral finger-to-nose heel-to-shin dysmetria  GAIT/STANCE: Need push-up to get up from seated position, wide-based unsteady, ataxic gait REVIEW OF SYSTEMS:  Full 14 system review of systems performed and notable only for as above All other review of systems were negative.   ALLERGIES: Allergies  Allergen Reactions   Amoxicillin Other (See Comments)    Pt states that it does not work for him when he had back to back pneumonia   Effexor [Venlafaxine] Diarrhea   Fluoxetine Hcl    Prozac [Fluoxetine Hcl] Diarrhea    HOME MEDICATIONS: Current Outpatient Medications  Medication Sig Dispense Refill   aspirin EC 81 MG tablet  Take 81 mg by mouth daily. Swallow whole.     Cholecalciferol (VITAMIN D-3) 25 MCG (1000 UT) CAPS Take 1 capsule by mouth daily.     clonazePAM (KLONOPIN) 0.5 MG tablet Take 1 tablet (0.5 mg total) by mouth 2 (two) times daily as needed for anxiety. 60 tablet 1   DULoxetine (CYMBALTA) 60 MG capsule Take 1 capsule (60 mg total) by mouth daily. 30 capsule 11   fluticasone (FLONASE) 50 MCG/ACT nasal spray Place 2 sprays into both nostrils daily. (Patient not taking: Reported on 09/28/2023) 48 g 1   gabapentin (NEURONTIN) 300 MG capsule Take 2 capsules (600 mg total) by mouth 3 (three) times daily. 180 capsule 11   indapamide (LOZOL) 1.25 MG tablet Take 1 tablet (1.25 mg total) by mouth daily. 90 tablet 0   irbesartan (AVAPRO) 300 MG tablet Take 1 tablet (300 mg total) by mouth daily. 90 tablet 0   metoprolol succinate (TOPROL XL) 25 MG 24 hr tablet Take 1 tablet (25 mg total) by mouth daily. 90 tablet 0   Multiple Vitamins-Minerals (ZINC PO) Take by mouth.     oxyCODONE-acetaminophen (PERCOCET/ROXICET) 5-325 MG tablet Take 1 tablet by mouth every 8 (eight) hours as needed for severe pain (pain score 7-10). 90 tablet 0   sertraline (ZOLOFT) 25 MG tablet Take 1 tablet (25 mg total) by mouth daily. 30 tablet 0   UNITHROID 125 MCG tablet Take 1 tablet (125 mcg total) by mouth daily before breakfast. 90 tablet 0   No current facility-administered medications  for this visit.    PAST MEDICAL HISTORY: Past Medical History:  Diagnosis Date   Allergy    Anxiety    Arthritis    Cataract    Depression    Detached retina    Heart murmur    History of bilateral cataract extraction    Hyperlipidemia    Hypothyroidism    Low back pain    Panic attack    Panic attacks     PAST SURGICAL HISTORY: Past Surgical History:  Procedure Laterality Date   CATARACT EXTRACTION     EYE SURGERY      FAMILY HISTORY: Family History  Problem Relation Age of Onset   Breast cancer Mother    Hypertension  Father    Heart disease Father    Asthma Father    Emphysema Father        smoker for 45 years   Diabetes Brother    Hyperlipidemia Brother     SOCIAL HISTORY: Social History   Socioeconomic History   Marital status: Married    Spouse name: Not on file   Number of children: 0   Years of education: Not on file   Highest education level: Not on file  Occupational History   Occupation: Cumputer operator/security clearance [employer]  Tobacco Use   Smoking status: Never    Passive exposure: Past   Smokeless tobacco: Never   Tobacco comments:    As a teenager  Vaping Use   Vaping status: Never Used  Substance and Sexual Activity   Alcohol use: Not Currently   Drug use: No   Sexual activity: Yes    Birth control/protection: Condom  Other Topics Concern   Not on file  Social History Narrative   Caffienated drinks-no   Seat belt use often-yes   Regular Exercise-yes   Smoke alarm in the home-yes   Firearms/guns in the home-no   History of physical abuse-no               Social Drivers of Health   Financial Resource Strain: Patient Declined (05/27/2023)   Overall Financial Resource Strain (CARDIA)    Difficulty of Paying Living Expenses: Patient declined  Food Insecurity: No Food Insecurity (08/28/2023)   Hunger Vital Sign    Worried About Running Out of Food in the Last Year: Never true    Ran Out of Food in the Last Year: Never true  Transportation Needs: No Transportation Needs (08/28/2023)   PRAPARE - Administrator, Civil Service (Medical): No    Lack of Transportation (Non-Medical): No  Physical Activity: Insufficiently Active (05/27/2023)   Exercise Vital Sign    Days of Exercise per Week: 4 days    Minutes of Exercise per Session: 30 min  Stress: Stress Concern Present (05/27/2023)   [employer] of Occupational Health - Occupational Stress Questionnaire    Feeling of Stress : Rather much  Social Connections: Unknown (05/27/2023)    Social Connection and Isolation Panel [NHANES]    Frequency of Communication with Friends and Family: Patient declined    Frequency of Social Gatherings with Friends and Family: Once a week    Attends Religious Services: More than 4 times per year    Active Member of [employer] or Organizations: Yes    Attends Banker Meetings: More than 4 times per year    Marital Status: Patient declined  Intimate Partner Violence: Not At Risk (08/28/2023)   Humiliation, Afraid, Rape, and Kick questionnaire  Fear of Current or Ex-Partner: No    Emotionally Abused: No    Physically Abused: No    Sexually Abused: No      Levert Feinstein, M.D. Ph.D.  [facility] [address] [place], [place] [address] Ph: [phone] Fax: [phone]  CC:  [name], MD [address] [place],  [place] [address]  [name], MD

## 2023-09-30 NOTE — ED Notes (Signed)
ED TO INPATIENT HANDOFF REPORT  ED Nurse Name and Phone #:  Yancey Flemings [phone]  S Name/Age/Gender Jonathan Luna 57 y.o. male Room/Bed: 016C/016C  Code Status   Code Status: Full Code  Home/SNF/Other Home Patient oriented to: self, place, time, and situation Is this baseline? Yes   Triage Complete: Triage complete  Chief Complaint Gait abnormality [R26.9]  Triage Note Pt came to ED for AIDP and is here for admission. Chronic back pain. Axox4.    Allergies Allergies  Allergen Reactions   Amoxicillin Other (See Comments)    Pt states that it does not work for him when he had back to back pneumonia   Effexor [Venlafaxine] Diarrhea   Fluoxetine Hcl    Prozac [Fluoxetine Hcl] Diarrhea    Level of Care/Admitting Diagnosis ED Disposition     ED Disposition  Admit   Condition  --   Comment  Hospital Area: [facility] [[id number]]  Level of Care: Progressive [102]  Admit to Progressive based on following criteria: NEUROLOGICAL AND NEUROSURGICAL complex patients with significant risk of instability, who do not meet ICU criteria, yet require close observation or frequent assessment (< / = every 2 - 4 hours) with medical / nursing intervention.  May admit patient to [facility] or [facility] if equivalent level of care is available:: No  Covid Evaluation: Asymptomatic - no recent exposure (last 10 days) testing not required  Diagnosis: Gait abnormality [454098]  Admitting Physician: Synetta Fail [[id number]]  Attending Physician: Synetta Fail [[id number]]  Certification:: I certify this patient will need inpatient services for at least 2 midnights  Expected Medical Readiness: 10/03/2023          B Medical/Surgery History Past Medical History:  Diagnosis Date   Allergy    Anxiety    Arthritis    Cataract    Community acquired pneumonia 08/28/2023   Depression    Detached retina    Heart murmur    History of bilateral cataract extraction     Hyperlipidemia    Hypothyroidism    Low back pain    Panic attack    Panic attacks    Past Surgical History:  Procedure Laterality Date   CATARACT EXTRACTION     EYE SURGERY       A IV Location/Drains/Wounds Patient Lines/Drains/Airways Status     Active Line/Drains/Airways     Name Placement date Placement time Site Days   Peripheral IV 09/30/23 20 G Left;Posterior Forearm 09/30/23  1500  Forearm  less than 1            Intake/Output Last 24 hours No intake or output data in the 24 hours ending 09/30/23 1845  Labs/Imaging Results for orders placed or performed during the hospital encounter of 09/30/23 (from the past 48 hours)  I-Stat CG4 Lactic Acid, ED     Status: None   Collection Time: 09/30/23  3:52 PM  Result Value Ref Range   Lactic Acid, Venous 1.7 0.5 - 1.9 mmol/L  CBC with Differential     Status: Abnormal   Collection Time: 09/30/23  3:53 PM  Result Value Ref Range   WBC 10.4 4.0 - 10.5 K/uL   RBC 4.95 4.22 - 5.81 MIL/uL   Hemoglobin 15.4 13.0 - 17.0 g/dL   HCT 08.6 57.8 - 46.9 %   MCV 90.3 80.0 - 100.0 fL   MCH 31.1 26.0 - 34.0 pg   MCHC 34.5 30.0 - 36.0 g/dL   RDW  13.2 11.5 - 15.5 %   Platelets 354 150 - 400 K/uL   nRBC 0.0 0.0 - 0.2 %   Neutrophils Relative % 45 %   Neutro Abs 4.7 1.7 - 7.7 K/uL   Lymphocytes Relative 35 %   Lymphs Abs 3.6 0.7 - 4.0 K/uL   Monocytes Relative 10 %   Monocytes Absolute 1.0 0.1 - 1.0 K/uL   Eosinophils Relative 9 %   Eosinophils Absolute 0.9 (H) 0.0 - 0.5 K/uL   Basophils Relative 1 %   Basophils Absolute 0.1 0.0 - 0.1 K/uL   Immature Granulocytes 0 %   Abs Immature Granulocytes 0.04 0.00 - 0.07 K/uL    Comment: Performed at [facility], [address] [place], [place] [address]  Basic metabolic panel     Status: Abnormal   Collection Time: 09/30/23  3:53 PM  Result Value Ref Range   Sodium 132 (L) 135 - 145 mmol/L   Potassium 4.4 3.5 - 5.1 mmol/L   Chloride 97 (L) 98 - 111 mmol/L   CO2 21 (L)  22 - 32 mmol/L   Glucose, Bld 81 70 - 99 mg/dL    Comment: Glucose reference range applies only to samples taken after fasting for at least 8 hours.   BUN 8 6 - 20 mg/dL   Creatinine, Ser 6.04 0.61 - 1.24 mg/dL   Calcium 9.7 8.9 - 54.0 mg/dL   GFR, Estimated >98 >11 mL/min    Comment: (NOTE) Calculated using the CKD-EPI Creatinine Equation (2021)    Anion gap 14 5 - 15    Comment: Performed at [facility], [address] [place], [place] [address]  Brain natriuretic peptide     Status: None   Collection Time: 09/30/23  3:53 PM  Result Value Ref Range   B Natriuretic Peptide 4.4 0.0 - 100.0 pg/mL    Comment: Performed at [facility], [address], [place], [place] [address]  I-Stat venous blood gas, [facility], [facility], [facility])     Status: Abnormal   Collection Time: 09/30/23  4:18 PM  Result Value Ref Range   pH, Ven 7.712 (HH) 7.25 - 7.43   pCO2, Ven 15.7 (LL) 44 - 60 mmHg   pO2, Ven 200 (H) 32 - 45 mmHg   Bicarbonate 19.9 (L) 20.0 - 28.0 mmol/L   TCO2 20 (L) 22 - 32 mmol/L   O2 Saturation 100 %   Acid-Base Excess 3.0 (H) 0.0 - 2.0 mmol/L   Sodium 128 (L) 135 - 145 mmol/L   Potassium 4.2 3.5 - 5.1 mmol/L   Calcium, Ion 0.79 (LL) 1.15 - 1.40 mmol/L   HCT 44.0 39.0 - 52.0 %   Hemoglobin 15.0 13.0 - 17.0 g/dL   Sample type VENOUS    Comment NOTIFIED PHYSICIAN   Resp panel by RT-PCR (RSV, Flu A&B, Covid) Anterior Nasal Swab     Status: None   Collection Time: 09/30/23  4:40 PM   Specimen: Anterior Nasal Swab  Result Value Ref Range   SARS Coronavirus 2 by RT PCR NEGATIVE NEGATIVE   Influenza A by PCR NEGATIVE NEGATIVE   Influenza B by PCR NEGATIVE NEGATIVE    Comment: (NOTE) The Xpert Xpress SARS-CoV-2/FLU/RSV plus assay is intended as an aid in the diagnosis of influenza from Nasopharyngeal swab specimens and should not be used as a sole basis for treatment. Nasal washings and aspirates are unacceptable for Xpert Xpress SARS-CoV-2/FLU/RSV testing.  Fact Sheet for  Patients:  BloggerCourse.com  Fact Sheet for Healthcare Providers: SeriousBroker.it  This test is not yet approved or cleared by the [place] FDA and has been authorized for detection and/or diagnosis of SARS-CoV-2 by FDA under an Emergency Use Authorization (EUA). This EUA will remain in effect (meaning this test can be used) for the duration of the COVID-19 declaration under Section 564(b)(1) of the Act, 21 U.S.C. section 360bbb-3(b)(1), unless the authorization is terminated or revoked.     Resp Syncytial Virus by PCR NEGATIVE NEGATIVE    Comment: (NOTE) Fact Sheet for Patients: BloggerCourse.com  Fact Sheet for Healthcare Providers: SeriousBroker.it  This test is not yet approved or cleared by the [place] FDA and has been authorized for detection and/or diagnosis of SARS-CoV-2 by FDA under an Emergency Use Authorization (EUA). This EUA will remain in effect (meaning this test can be used) for the duration of the COVID-19 declaration under Section 564(b)(1) of the Act, 21 U.S.C. section 360bbb-3(b)(1), unless the authorization is terminated or revoked.  Performed at [facility], [address] [place], [place] [address]    DG Chest Portable 1 View Result Date: 09/30/2023 CLINICAL DATA:  Dyspnea on exertion EXAM: PORTABLE CHEST 1 VIEW COMPARISON:  Chest radiograph dated 10/29/2022. CTA chest dated 08/28/2023. FINDINGS: Persistent right upper lobe opacity, possibly improving infection/pneumonia, but underlying lesion/mass is not excluded. Consider CT chest for further evaluation. Left lung is clear.  No pleural effusion or pneumothorax. The heart is normal in size. IMPRESSION: Persistent right upper lobe opacity, possibly improving infection/pneumonia, but underlying lesion/mass is not excluded. Consider CT chest for further evaluation. Electronically Signed   By:  Charline Bills M.D.   On: 09/30/2023 16:33   MR Lumbar Spine W Wo Contrast Result Date: 09/30/2023  [facility] [address] [place], [place] [address] [phone] NEUROIMAGING REPORT STUDY DATE: 09/29/2023 PATIENT NAME: LERON LOSIER DOB: 1965-12-04 MRN: [id number] ORDERING CLINICIAN: Dr. Terrace Arabia CLINICAL HISTORY: 57 year old patient being evaluated for gait abnormality COMPARISON FILMS: None EXAM: MRI lumbar spine with and without contrast TECHNIQUE: Sagittal T1, T2, STIR, axial T1, T2 and postcontrast sagittal and axial T1 images were obtained through lumbar spine CONTRAST: 15 mL IV MultiHance IMAGING SITE: [facility] at [address] FINDINGS: The lumbar vertebrae demonstrate loss of forward lordotic curvature with mild posterior subluxation of L3 over L2 vertebrae.  There are prominent disc degenerative changes throughout with endplate degenerative changes at L1-L2, L2/3 and L3-4. L1-L2 shows loss of disc height with degenerative change along with facet hypertrophy but no significant compression. . L2-3 also shows prominent disc degenerative change and endplate degenerative change along with facet hypertrophy resulting in mild left greater than right foraminal narrowing but no definite compression. L3-4 shows mild disc and prominent endplate degenerative change along with facet hypertrophy but no definite compression. L4-5 shows mild disc and prominent facet degenerative changes resulting in moderate right and mild left-sided foraminal narrowing with only possible slight encroachment on exiting nerve roots L5-S1 shows mild disc and prominent facet degenerative change but no significant compression. The conus medullaris terminates at L1.  Paraspinal soft tissue appear unremarkable.  Visualized portion lower thoracic vertebrae also show minor disc degenerative changes.  Postcontrast images do not result in abnormal areas of enhancement   MRI scan of the lumbar spine with and without  contrast showing prominent disc and facet degenerative changes throughout most prominent at L4-5 but there is moderate right and mild left-sided foraminal narrowing possible encroachment on exiting nerve  roots. INTERPRETING PHYSICIAN: PRAMOD SETHI, MD Certified in  Neuroimaging by American Society of Neuroimaging and [employer]  MR THORACIC SPINE W WO CONTRAST Result Date: 09/30/2023  [facility] [address] [place], [place] [address] [phone] NEUROIMAGING REPORT STUDY DATE: 09/29/2023 PATIENT NAME: HELIX PATTERSON DOB: [birth year] MRN: [id number] ORDERING CLINICIAN: Dr. Terrace Arabia CLINICAL HISTORY: 57 year old patient being evaluated for gait abnormality COMPARISON FILMS: None EXAM: MRI thoracic spine with and without contrast TECHNIQUE: Sagittal T1, T2, STIR, axial T1, T2 and postcontrast axial and sagittal T1 images were obtained through thoracic spine CONTRAST: 15 mL IV MultiHance IMAGING SITE: [facility] [address] FINDINGS: The thoracic vertebrae demonstrate normal alignment and body height and marrow signal characteristics except at T8-9 and T9-10 where there are mild endplate marrow degenerative changes.  There mild disc signal abnormalities but there is no frank disc herniation, cord compression significant root or foraminal encroachment.  Paraspinal soft tissue appear unremarkable.  Postcontrast images do not result in abnormal areas of enhancement.  Spinal cord parenchyma shows no abnormal signal intensities.  Visualized portion of the lower cervical and upper lumbar vertebrae also show minor disc degenerative changes.   MRI scan of thoracic spine with and without contrast showing only minor disc and endplate degenerative changes without significant compression. INTERPRETING PHYSICIAN: Delia Heady, MD Certified in  Neuroimaging by American Society of Neuroimaging and [employer]  NCV with  EMG(electromyography) Result Date: 09/30/2023 Levert Feinstein, MD     09/30/2023  6:03 PM     Full Name: Franisco Fedorov Gender: Male MRN #: [id number] Date of Birth: 08-27-1966   Visit Date: 09/30/2023 09:28 Age: 16 Years Examining Physician: Dr. Levert Feinstein Referring Physician: Dr. Levert Feinstein Height: 5 feet 7 inch History: 57 year old male presenting with subacute onset of gait abnormality in October 2024 Summary of the test: Nerve conduction study: Bilateral sural, superficial peroneal sensory responses were within normal limit, with upper limit of peak latency. Left radial sensory responses were normal.  Left ulnar, medial sensory responses showed mildly decreased snap amplitude. Bilateral tibial motor responses were normal at distal stimulation site, with evidence of temporal dispersion at proximal stimulation site.  Bilateral tibial F-wave latency was prolonged. Bilateral peroneal to EDB motor response showed significantly decreased CMAP amplitude. Left ulnar, median motor responses showed evidence of decreased CMAP amplitude, evidence of temporal dispersion at proximal stimulation site, with upper limit of left ulnar F-wave latency. Electromyography: Selected needle examinations were performed at left lower extremity muscles, lumbosacral paraspinal muscles;, left upper extremity muscles and left cervical paraspinal muscles. There is no evidence of active denervation, but there is evidence of decreased recruitment patterns at all the muscle tested. There is noticeable polyphasic changes at left cervical and lumbar paraspinal muscles. Conclusion: This is an abnormal study.  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  There is no evidence of axonal loss. Levert Feinstein. M.D. Ph.D. [facility] [address], [address] [place], [place] [address] Tel: [phone] Fax: [phone] Verbal informed consent was obtained from the patient, patient was informed of potential risk of  procedure, including bruising, bleeding, hematoma formation, infection, muscle weakness, muscle pain, numbness, among others.     MNC   Nerve / Sites Muscle Latency Ref. Amplitude Ref. Rel Amp Segments Distance Velocity Ref. Area   ms ms mV mV %  cm m/s m/s mVms L Median - APB    Wrist APB 4.0 <=4.4 3.2 >=4.0 100 Wrist -  APB 7   8.8    Upper arm APB 8.3  1.7  52 Upper arm - Wrist 20 46 >=49 5.5 L Ulnar - ADM    Wrist ADM 3.0 <=3.3 3.8 >=6.0 100 Wrist - ADM 7   15.1    B.Elbow ADM 4.6  1.2  31 B.Elbow - Wrist 11 69 >=49 3.4    A.Elbow ADM 8.3  2.9  247 A.Elbow - B.Elbow 19 52 >=49 13.3 L Peroneal - EDB    Ankle EDB 7.0 <=6.5 0.9 >=2.0 100 Ankle - EDB 9   3.0    Fib head EDB 14.4  0.5  63.7 Fib head - Ankle 24 32 >=44 1.7    Pop fossa EDB 17.1  0.6  112 Pop fossa - Fib head 12 45 >=44 1.5        Pop fossa - Ankle     R Peroneal - EDB    Ankle EDB 7.5 <=6.5 0.8 >=2.0 100 Ankle - EDB 9   2.2    Fib head EDB 13.3  0.5  64.9 Fib head - Ankle 23.6 40 >=44 2.5    Pop fossa EDB 15.1  0.7  152 Pop fossa - Fib head 10.6 60 >=44 3.4        Pop fossa - Ankle     L Tibial - AH    Ankle AH 4.9 <=5.8 4.1 >=4.0 100 Ankle - AH 9   14.8    Pop fossa AH 13.6  0.8  20.8 Pop fossa - Ankle 37 42 >=41 3.7 R Tibial - AH    Ankle AH 5.2 <=5.8 4.6 >=4.0 100 Ankle - AH 9   21.8    Pop fossa AH 15.7  1.4  30.1 Pop fossa - Ankle 37 35 >=41 4.8               SNC   Nerve / Sites Rec. Site Peak Lat Ref.  Amp Ref. Segments Distance   ms ms V V  cm L Radial - Anatomical snuff box (Forearm)    Forearm Wrist 2.3 <=2.9 16 >=15 Forearm - Wrist 10 L Sural - Ankle (Calf)    Calf Ankle 4.2 <=4.4 8 >=6 Calf - Ankle 14 R Sural - Ankle (Calf)    Calf Ankle 4.4 <=4.4 6 >=6 Calf - Ankle 14 L Superficial peroneal - Ankle    Lat leg Ankle 4.2 <=4.4 10 >=6 Lat leg - Ankle 14 R Superficial peroneal - Ankle    Lat leg Ankle 4.5 <=4.4 4 >=6 Lat leg - Ankle 14 L Median - Orthodromic (Dig II, Mid palm)    Dig II Wrist 3.1 <=3.4 4 >=10 Dig II - Wrist 13 L Ulnar -  Orthodromic, (Dig V, Mid palm)    Dig V Wrist 2.8 <=3.1 3 >=5 Dig V - Wrist 41                 F  Wave   Nerve F Lat Ref.  ms ms L Tibial - AH 60.5 <=56.0 R Tibial - AH 61.0 <=56.0 L Ulnar - ADM 31.3 <=32.0 L Median - APB 31.9 <=31.0           EMG Summary Table   Spontaneous MUAP Recruitment Muscle IA Fib PSW Fasc Other Amp Dur. Poly Pattern L. Tibialis anterior Increased None None None _______ Increased Increased 1+ Reduced L. Peroneus longus Increased None None None _______ Increased Increased 1+ Reduced L. Gastrocnemius (Medial head) Increased None  None None _______ Increased Increased 1+ Reduced L. Vastus lateralis Increased None None None _______ Increased Increased 1+ Reduced L. First dorsal interosseous Increased None None None _______ Increased Increased 1+ Reduced L. Extensor digitorum communis Increased None None None _______ Increased Increased 1+ Reduced L. Biceps brachii Increased None None None _______ Increased Increased 1+ Reduced L. Deltoid Increased None None None _______ Increased Increased 1+ Reduced L. Pronator teres Increased None None None _______ Increased Increased 1+ Reduced L. Cervical paraspinals Normal None None None _______ Normal Normal Normal Normal L. Lumbar paraspinals (low) Normal None None None _______ Increased Increased 1+ Normal L. Lumbar paraspinals (mid) Increased None None None _______ Increased Increased 2+ Normal     Pending Labs Unresulted Labs (From admission, onward)     Start     Ordered   10/07/23 0500  Creatinine, serum  (enoxaparin (LOVENOX)    CrCl >/= 30 ml/min)  Weekly,   R     Comments: while on enoxaparin therapy    09/30/23 1745   10/01/23 0500  Comprehensive metabolic panel  Tomorrow morning,   R        09/30/23 1745   10/01/23 0500  CBC  Tomorrow morning,   R        09/30/23 1745            Vitals/Pain Today's Vitals   09/30/23 1700 09/30/23 1726 09/30/23 1730 09/30/23 1820  BP: 125/79 (!) 132/92 115/73   Pulse: 85  80   Resp: 18   14   Temp:    97.9 F (36.6 C)  TempSrc:    Oral  SpO2: 99%  99%   Weight:      Height:      PainSc:        Isolation Precautions No active isolations  Medications Medications  Immune Globulin 10% (PRIVIGEN) IV infusion 25 g ( Intravenous Rate/Dose Verify 09/30/23 1724)  acetaminophen (TYLENOL) tablet 1,000 mg (1,000 mg Oral Not Given 09/30/23 1746)  metoprolol succinate (TOPROL-XL) 24 hr tablet 25 mg (has no administration in time range)  DULoxetine (CYMBALTA) DR capsule 60 mg (has no administration in time range)  sertraline (ZOLOFT) tablet 25 mg (has no administration in time range)  levothyroxine (SYNTHROID) tablet 125 mcg (has no administration in time range)  enoxaparin (LOVENOX) injection 40 mg (has no administration in time range)  sodium chloride flush (NS) 0.9 % injection 3 mL (has no administration in time range)  acetaminophen (TYLENOL) tablet 650 mg (has no administration in time range)    Or  acetaminophen (TYLENOL) suppository 650 mg (has no administration in time range)  polyethylene glycol (MIRALAX / GLYCOLAX) packet 17 g (has no administration in time range)  oxyCODONE (Oxy IR/ROXICODONE) immediate release tablet 5 mg (5 mg Oral Given 09/30/23 1741)     R Recommendations: See Admitting Provider Note  Report given to:   Additional Notes:

## 2023-09-30 NOTE — ED Triage Notes (Signed)
Pt came to ED for AIDP and is here for admission. Chronic back pain. Axox4.

## 2023-09-30 NOTE — H&P (Signed)
History and Physical   [name]:[id number] DOB: 1965/10/28 DOA: 09/30/2023  PCP: [name], MD   Patient coming from: Home/neurology office  Chief Complaint: Ascending paralysis  HPI: Jonathan Luna is a 57 y.o. male with medical history significant of hypertension, hyperlipidemia, hypothyroidism, chronic pain, anxiety, neuropathy, gait abnormality persisting with worsening gait abnormality and ascending paralysis.  Patient initially referred to neurology in the past month due to worsening gait.  He had pneumonia in October completed azithromycin followed by Augmentin and a few days later began to have some numbness and tingling in his feet and toes with associated burning sensation.  By the end of October he had began to have hand paresthesias and his gait became progressively abnormal throughout November.  In the middle of November he was admitted for sepsis secondary to pneumonia and treated with antibiotics.  Respiratory symptoms improved but he had persistent gait abnormality since that time.  He had significant episode of worsened gait abnormality weakness and shortness of breath at [employer] 1 day and had to be using the cart for support by the time he left.  Since that time, he has been getting around with assistance of a wheelchair for the most part.  This was in part due to the fact that he had 5 falls in the past several weeks as well.  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  Patient was sent to the ED for admission, inpatient neurology consult, IVIG.  ED Course: Vital signs in the ED largely stable though there was an initial low blood pressure reading which improved without intervention.  Lab workup included BMP with sodium 132, chloride 97, bicarb 21.  CBC within normal limits.  BNP and lactic acid normal.  Respiratory panel  pending.  Chest x-ray with persistent right upper lobe opacity.  Neurology made aware of patient's arrival and has ordered IVIG.  There are some discussion of possible LP outpatient though this has not been ordered by neurology.  Did have MRI of L-spine and T-spine done outpatient.  MR L-spine showed degenerative changes of intervertebral disc and facets most prominent at L4-5 with moderate right and mild left foraminal narrowing.  MRI T-spine showed only minor disc and endplate degenerative changes without significant compression.  Review of Systems: As per HPI otherwise all other systems reviewed and are negative.  Past Medical History:  Diagnosis Date   Allergy    Anxiety    Arthritis    Cataract    Community acquired pneumonia 08/28/2023   Depression    Detached retina    Heart murmur    History of bilateral cataract extraction    Hyperlipidemia    Hypothyroidism    Low back pain    Panic attack    Panic attacks     Past Surgical History:  Procedure Laterality Date   CATARACT EXTRACTION     EYE SURGERY      Social History  reports that he has never smoked. He has been exposed to tobacco smoke. He has never used smokeless tobacco. He reports that he does not currently use alcohol. He reports that he does not use drugs.  Allergies  Allergen Reactions   Amoxicillin Other (See Comments)    Pt states that it does not work for him when he had back to back pneumonia   Effexor [Venlafaxine] Diarrhea   Fluoxetine  Hcl    Prozac [Fluoxetine Hcl] Diarrhea    Family History  Problem Relation Age of Onset   Breast cancer Mother    Hypertension Father    Heart disease Father    Asthma Father    Emphysema Father        smoker for 45 years   Diabetes Brother    Hyperlipidemia Brother   Reviewed on admission  Prior to Admission medications   Medication Sig Start Date End Date Taking? Authorizing Provider  aspirin EC 81 MG tablet Take 81 mg by mouth daily. Swallow whole.     [provider]  Cholecalciferol (VITAMIN D-3) 25 MCG (1000 UT) CAPS Take 1 capsule by mouth daily.    [provider]  clonazePAM (KLONOPIN) 0.5 MG tablet Take 1 tablet (0.5 mg total) by mouth 2 (two) times daily as needed for anxiety. 09/18/23   [name], MD  DULoxetine (CYMBALTA) 60 MG capsule Take 1 capsule (60 mg total) by mouth daily. 09/22/23   Levert Feinstein, MD  fluticasone (FLONASE) 50 MCG/ACT nasal spray Place 2 sprays into both nostrils daily. Patient not taking: Reported on 09/28/2023 08/18/23   [name], MD  gabapentin (NEURONTIN) 300 MG capsule Take 2 capsules (600 mg total) by mouth 3 (three) times daily. 09/22/23   Levert Feinstein, MD  indapamide (LOZOL) 1.25 MG tablet Take 1 tablet (1.25 mg total) by mouth daily. 05/27/23   [name], MD  irbesartan (AVAPRO) 300 MG tablet Take 1 tablet (300 mg total) by mouth daily. 09/04/23   [name], MD  metoprolol succinate (TOPROL XL) 25 MG 24 hr tablet Take 1 tablet (25 mg total) by mouth daily. 09/04/23   [name], MD  Multiple Vitamins-Minerals (ZINC PO) Take by mouth.    [provider]  oxyCODONE-acetaminophen (PERCOCET/ROXICET) 5-325 MG tablet Take 1 tablet by mouth every 8 (eight) hours as needed for severe pain (pain score 7-10). 09/04/23   [name], MD  sertraline (ZOLOFT) 25 MG tablet Take 1 tablet (25 mg total) by mouth daily. 09/04/23   [name], MD  UNITHROID 125 MCG tablet Take 1 tablet (125 mcg total) by mouth daily before breakfast. 09/09/23   [name], MD    Physical Exam: Vitals:   09/30/23 1645 09/30/23 1646 09/30/23 1700 09/30/23 1726  BP:  123/76 125/79 (!) 132/92  Pulse: 77  85   Resp: 13  18   Temp:      SpO2: 99%  99%   Weight:      Height:        Physical Exam Constitutional:      General: He is not in acute distress.    Appearance: Normal appearance.  HENT:     Head: Normocephalic and atraumatic.     Mouth/Throat:     Mouth: Mucous  membranes are moist.     Pharynx: Oropharynx is clear.  Eyes:     Extraocular Movements: Extraocular movements intact.     Pupils: Pupils are equal, round, and reactive to light.  Cardiovascular:     Rate and Rhythm: Normal rate and regular rhythm.     Pulses: Normal pulses.     Heart sounds: Normal heart sounds.  Pulmonary:     Effort: Pulmonary effort is normal. No respiratory distress.     Breath sounds: Normal breath sounds.  Abdominal:     General: Bowel sounds are normal. There is no distension.  Palpations: Abdomen is soft.     Tenderness: There is no abdominal tenderness.  Musculoskeletal:        General: No swelling or deformity.  Skin:    General: Skin is warm and dry.  Neurological:     Comments: Peripheral paresthesias.  Minimally reduced strength.  Tremor with intention, but not at rest.    Labs on Admission: I have personally reviewed following labs and imaging studies  CBC: Recent Labs  Lab 09/30/23 1553 09/30/23 1618  WBC 10.4  --   NEUTROABS 4.7  --   HGB 15.4 15.0  HCT 44.7 44.0  MCV 90.3  --   PLT 354  --     Basic Metabolic Panel: Recent Labs  Lab 09/30/23 1553 09/30/23 1618  NA 132* 128*  K 4.4 4.2  CL 97*  --   CO2 21*  --   GLUCOSE 81  --   BUN 8  --   CREATININE 0.78  --   CALCIUM 9.7  --     GFR: Estimated Creatinine Clearance: 105.5 mL/min (by C-G formula based on SCr of 0.78 mg/dL).  Liver Function Tests: No results for input(s): "AST", "ALT", "ALKPHOS", "BILITOT", "PROT", "ALBUMIN" in the last 168 hours.  Urine analysis:    Component Value Date/Time   COLORURINE YELLOW 08/18/2023 1114   APPEARANCEUR CLEAR 08/18/2023 1114   LABSPEC <=1.005 (A) 08/18/2023 1114   PHURINE 6.0 08/18/2023 1114   GLUCOSEU NEGATIVE 08/18/2023 1114   HGBUR NEGATIVE 08/18/2023 1114   HGBUR negative 04/02/2010 0911   BILIRUBINUR NEGATIVE 08/18/2023 1114   KETONESUR NEGATIVE 08/18/2023 1114   UROBILINOGEN 0.2 08/18/2023 1114   NITRITE  NEGATIVE 08/18/2023 1114   LEUKOCYTESUR NEGATIVE 08/18/2023 1114    Radiological Exams on Admission: DG Chest Portable 1 View Result Date: 09/30/2023 CLINICAL DATA:  Dyspnea on exertion EXAM: PORTABLE CHEST 1 VIEW COMPARISON:  Chest radiograph dated 10/29/2022. CTA chest dated 08/28/2023. FINDINGS: Persistent right upper lobe opacity, possibly improving infection/pneumonia, but underlying lesion/mass is not excluded. Consider CT chest for further evaluation. Left lung is clear.  No pleural effusion or pneumothorax. The heart is normal in size. IMPRESSION: Persistent right upper lobe opacity, possibly improving infection/pneumonia, but underlying lesion/mass is not excluded. Consider CT chest for further evaluation. Electronically Signed   By: Charline Bills M.D.   On: 09/30/2023 16:33   MR Lumbar Spine W Wo Contrast Result Date: 09/30/2023  [facility] [address] [place], [place] [address] [phone] NEUROIMAGING REPORT STUDY DATE: 09/29/2023 PATIENT NAME: Jonathan Luna DOB: May 24, 1966 MRN: [id number] ORDERING CLINICIAN: Dr. Terrace Arabia CLINICAL HISTORY: 57 year old patient being evaluated for gait abnormality COMPARISON FILMS: None EXAM: MRI lumbar spine with and without contrast TECHNIQUE: Sagittal T1, T2, STIR, axial T1, T2 and postcontrast sagittal and axial T1 images were obtained through lumbar spine CONTRAST: 15 mL IV MultiHance IMAGING SITE: [facility] at [address] FINDINGS: The lumbar vertebrae demonstrate loss of forward lordotic curvature with mild posterior subluxation of L3 over L2 vertebrae.  There are prominent disc degenerative changes throughout with endplate degenerative changes at L1-L2, L2/3 and L3-4. L1-L2 shows loss of disc height with degenerative change along with facet hypertrophy but no significant compression. . L2-3 also shows prominent disc degenerative change and endplate degenerative change along with facet hypertrophy resulting in mild left greater  than right foraminal narrowing but no definite compression. L3-4 shows mild disc and prominent endplate degenerative change along with facet hypertrophy but no definite compression. L4-5 shows  mild disc and prominent facet degenerative changes resulting in moderate right and mild left-sided foraminal narrowing with only possible slight encroachment on exiting nerve roots L5-S1 shows mild disc and prominent facet degenerative change but no significant compression. The conus medullaris terminates at L1.  Paraspinal soft tissue appear unremarkable.  Visualized portion lower thoracic vertebrae also show minor disc degenerative changes.  Postcontrast images do not result in abnormal areas of enhancement   MRI scan of the lumbar spine with and without contrast showing prominent disc and facet degenerative changes throughout most prominent at L4-5 but there is moderate right and mild left-sided foraminal narrowing possible encroachment on exiting nerve roots. INTERPRETING PHYSICIAN: PRAMOD SETHI, MD Certified in  Neuroimaging by American Society of Neuroimaging and [employer]ities  MR THORACIC SPINE W WO CONTRAST Result Date: 09/30/2023  [facility] [address], [address] [place], [place] [address] [phone] NEUROIMAGING REPORT STUDY DATE: 09/29/2023 PATIENT NAME: Jonathan Luna DOB: 01-May-1966 MRN: [id number] ORDERING CLINICIAN: Dr. Terrace Arabia CLINICAL HISTORY: 57 year old patient being evaluated for gait abnormality COMPARISON FILMS: None EXAM: MRI thoracic spine with and without contrast TECHNIQUE: Sagittal T1, T2, STIR, axial T1, T2 and postcontrast axial and sagittal T1 images were obtained through thoracic spine CONTRAST: 15 mL IV MultiHance IMAGING SITE: [facility] at [address] FINDINGS: The thoracic vertebrae demonstrate normal alignment and body height and marrow signal characteristics except at T8-9 and T9-10 where there are mild endplate marrow degenerative  changes.  There mild disc signal abnormalities but there is no frank disc herniation, cord compression significant root or foraminal encroachment.  Paraspinal soft tissue appear unremarkable.  Postcontrast images do not result in abnormal areas of enhancement.  Spinal cord parenchyma shows no abnormal signal intensities.  Visualized portion of the lower cervical and upper lumbar vertebrae also show minor disc degenerative changes.   MRI scan of thoracic spine with and without contrast showing only minor disc and endplate degenerative changes without significant compression. INTERPRETING PHYSICIAN: Delia Heady, MD Certified in  Neuroimaging by American Society of Neuroimaging and [employer]ities   EKG: Not performed in emergency department.  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  Sent to the ED for IVIG and inpatient neurology evaluation.  Patient MRI L-spine and T-spine without explanation. > Neurology consulted and are following.  IVIG ordered.  > Initial NIF showed -40 with VC 4.46 L.  VBG with pH 7.7, pCO2 15.7.  Presenting respiratory alkalosis. - Monitor on progressive unit overnight - Appreciate neurology recommendations and assistance - Continue with IVIG - Scheduled NIF and VC - PT / OT  Hypertension - Had initial low blood pressure in the ED so we will hold off on home indapamide and irbesartan - Continue home metoprolol   Hypothyroidism - Continue home levothyroxine  Chronic pain - Continue home oxycodone  Anxiety - Continue home sertraline  DVT  prophylaxis: Lovenox Code Status:   Full Family Communication:  None on admission Disposition Plan:   Patient is from:  Home  Anticipated DC to:  Home  Anticipated DC date:  2 to 7 days  Anticipated DC barriers: None  Consults called:  Neurology Admission status:  Inpatient, progressive  Severity of Illness: The appropriate patient status for this patient is INPATIENT. Inpatient status is judged to be reasonable and necessary in order to provide the required intensity of service to ensure the patient's safety. The patient's presenting symptoms, physical exam findings, and initial radiographic and laboratory data in the context of their chronic comorbidities is felt to place them at high risk for further clinical deterioration. Furthermore, it is not anticipated that the patient will be medically stable for discharge from the hospital within 2 midnights of admission.   * I certify that at the point of admission it is my clinical judgment that the patient will require inpatient hospital care spanning beyond 2 midnights from the point of admission due to high intensity of service, high risk for further deterioration and high frequency of surveillance required.Synetta Fail MD [facility]  How to contact the [facility] Attending or Consulting provider 7A - 7P or covering provider during after hours 7P -7A, for this patient?   Check the care team in [facility] and look for a) attending/consulting [facility] provider listed and b) the [facility] team listed Log into [email or web address] and use [facility] universal password to access. If you do not have the password, please contact the hospital operator. Locate the [facility] provider you are looking for under [facility] and page to a number that you can be directly reached. If you still have difficulty reaching the provider, please page the [facility] (Director on Call) for the Hospitalists listed on amion for assistance.  09/30/2023, 5:33 PM

## 2023-10-01 ENCOUNTER — Inpatient Hospital Stay: Payer: 59 | Admitting: Adult Health

## 2023-10-01 ENCOUNTER — Inpatient Hospital Stay (HOSPITAL_COMMUNITY): Payer: 59

## 2023-10-01 DIAGNOSIS — R292 Abnormal reflex: Secondary | ICD-10-CM

## 2023-10-01 DIAGNOSIS — G610 Guillain-Barre syndrome: Secondary | ICD-10-CM | POA: Diagnosis not present

## 2023-10-01 LAB — COMPREHENSIVE METABOLIC PANEL
ALT: 18 U/L (ref 0–44)
AST: 21 U/L (ref 15–41)
Albumin: 3.6 g/dL (ref 3.5–5.0)
Alkaline Phosphatase: 54 U/L (ref 38–126)
Anion gap: 7 (ref 5–15)
BUN: 11 mg/dL (ref 6–20)
CO2: 27 mmol/L (ref 22–32)
Calcium: 9 mg/dL (ref 8.9–10.3)
Chloride: 99 mmol/L (ref 98–111)
Creatinine, Ser: 0.79 mg/dL (ref 0.61–1.24)
GFR, Estimated: >60 mL/min (ref >60)
Glucose, Bld: 85 mg/dL (ref 70–99)
Potassium: 3.6 mmol/L (ref 3.5–5.1)
Sodium: 133 mmol/L — ABNORMAL LOW (ref 135–145)
Total Bilirubin: 0.8 mg/dL (ref <1.2)
Total Protein: 7.3 g/dL (ref 6.5–8.1)

## 2023-10-01 LAB — CBC
HCT: 41.9 % (ref 39.0–52.0)
Hemoglobin: 14.3 g/dL (ref 13.0–17.0)
MCH: 30.6 pg (ref 26.0–34.0)
MCHC: 34.1 g/dL (ref 30.0–36.0)
MCV: 89.5 fL (ref 80.0–100.0)
Platelets: 323 10*3/uL (ref 150–400)
RBC: 4.68 MIL/uL (ref 4.22–5.81)
RDW: 13.2 % (ref 11.5–15.5)
WBC: 6.4 10*3/uL (ref 4.0–10.5)
nRBC: 0 % (ref 0.0–0.2)

## 2023-10-01 MED ORDER — HYDROCORTISONE 0.5 % EX CREA
TOPICAL_CREAM | Freq: Every day | CUTANEOUS | Status: DC | PRN
  Filled 2023-10-01: qty 28.35

## 2023-10-01 MED ORDER — CLONAZEPAM 0.5 MG PO TABS
0.5000 mg | ORAL_TABLET | Freq: Two times a day (BID) | ORAL | Status: DC | PRN
  Administered 2023-10-01 – 2023-10-04 (×8): 0.5 mg via ORAL
  Filled 2023-10-01 (×8): qty 1

## 2023-10-01 MED FILL — Immune Globulin (Human) IV Soln 5 GM/50ML: INTRAVENOUS | Qty: 50 | Status: AC

## 2023-10-01 MED FILL — Immune Globulin (Human) IV Soln 20 GM/200ML: INTRAVENOUS | Qty: 200 | Status: AC

## 2023-10-01 NOTE — Progress Notes (Signed)
OT Cancellation Note  Patient Details Name: Jonathan Luna MRN: [id number] DOB: Apr 21, 1966   Cancelled Treatment:    Reason Eval/Treat Not Completed: Patient at procedure or test/ unavailable (OTF for MRI upon arrival)  [name] 10/01/2023, 10:05 AM

## 2023-10-01 NOTE — Evaluation (Signed)
Physical Therapy Evaluation Patient Details Name: Jonathan Luna MRN: [id number] DOB: 02/03/1966 Today's Date: 10/01/2023  History of Present Illness  Pt is a 57 y/o male admitted 12/18 with an worsening gait abnormality and ascending paralysis.  MRI results pending.  IVIG started.  MD suspecting GBS.  PMHx  HTN, HLD, chronic pain, neuropathy, gait abnormality.  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  Pt needing min to light moderate assist overall.  Pt currently limited functionally due to the problems listed. ( See problems list.)   Pt will benefit from PT to maximize function and safety in order to get ready for next venue listed below.         If plan is discharge home, recommend the following: A little help with walking and/or transfers;A little help with bathing/dressing/bathroom;Assistance with cooking/housework;Help with stairs or ramp for entrance   Can travel by private vehicle        Equipment Recommendations Other (comment) (TBD)  Recommendations for Other Services  Rehab consult    Functional Status Assessment Patient has had a recent decline in their functional status and demonstrates the ability to make significant improvements in function in a reasonable and predictable amount of time.     Precautions / Restrictions Precautions Precautions: Fall Restrictions Weight Bearing Restrictions Per Provider Order: No      Mobility  Bed Mobility               General bed mobility comments: OOB in the chair on arrival    Transfers Overall transfer level: Needs assistance   Transfers: Sit to/from Stand Sit to Stand: Contact guard assist           General transfer comment: cues for safety/hand placement    Ambulation/Gait Ambulation/Gait assistance: Min assist, Mod assist (mod as he fatigue and began to be tremulous.) Gait Distance (Feet): 150 Feet (100 additional feet) Assistive device:  Rolling walker (2 wheels) Gait Pattern/deviations: Step-through pattern   Gait velocity interpretation: <1.8 ft/sec, indicate of risk for recurrent falls   General Gait Details: mildly unsteady, progressing to tremulous and less steady.  R steppage gait with foot slap.  Cues for posture and proximity to the RW--improved stability immediately until fatigue.  Stairs            Wheelchair Mobility     Tilt Bed    Modified Rankin (Stroke Patients Only)       Balance Overall balance assessment: Needs assistance Sitting-balance support: No upper extremity supported, Feet supported Sitting balance-Leahy Scale: Good     Standing balance support: Bilateral upper extremity supported, During functional activity, Reliant on assistive device for balance Standing balance-Leahy Scale: Poor                               Pertinent Vitals/Pain Pain Assessment Pain Assessment: Faces Faces Pain Scale: Hurts little more Pain Location: chronic back pain Pain Descriptors / Indicators: Grimacing Pain Intervention(s): Monitored during session    Home Living Family/patient expects to be discharged to:: Private residence Living Arrangements: Spouse/significant other Available Help at Discharge: Family Type of Home: House Home Access: Stairs to enter   Secretary/administrator of Steps: 1   Home Layout: One level Home Equipment: Grab bars - tub/shower;Rollator (4 wheels);Wheelchair - manual      Prior Function Prior Level of Function : Independent/Modified Independent;History of Falls (last six months)  Mobility Comments: ambulating with RW.5 falls in the last 3 weeks (without the RW); no fall since RW use ADLs Comments: mod I, wife assists with IADLs     Extremity/Trunk Assessment   Upper Extremity Assessment Upper Extremity Assessment: Defer to OT evaluation (bil parasthesias.) RUE Deficits / Details: parathesias and "stiffness" LUE Deficits /  Details: parathesias and "stiffness"    Lower Extremity Assessment Lower Extremity Assessment: RLE deficits/detail;LLE deficits/detail RLE Deficits / Details: incoordination, foot slap with higher steppage gait pattern.  general weakness. RLE Sensation: history of peripheral neuropathy RLE Coordination: decreased fine motor LLE Deficits / Details: Less incoordination, general weakness than R LE LLE Sensation: history of peripheral neuropathy LLE Coordination: decreased fine motor       Communication   Communication Communication: No apparent difficulties  Cognition Arousal: Alert Behavior During Therapy: WFL for tasks assessed/performed Overall Cognitive Status: Impaired/Different from baseline (NT formally)                                          General Comments General comments (skin integrity, edema, etc.): vss, Discussion of probable course if the dx is GBS    Exercises     Assessment/Plan    PT Assessment Patient needs continued PT services  PT Problem List Decreased strength;Decreased activity tolerance;Decreased balance;Decreased mobility;Decreased coordination;Pain;Decreased knowledge of use of DME;Decreased safety awareness       PT Treatment Interventions DME instruction;Gait training;Stair training;Functional mobility training;Therapeutic activities;Balance training;Neuromuscular re-education;Patient/family education    PT Goals (Current goals can be found in the Care Plan section)  Acute Rehab PT Goals Patient Stated Goal: back to independent PT Goal Formulation: With patient Time For Goal Achievement: 10/15/23 Potential to Achieve Goals: Good    Frequency Min 1X/week     Co-evaluation               AM-PAC PT "6 Clicks" Mobility  Outcome Measure Help needed turning from your back to your side while in a flat bed without using bedrails?: A Little Help needed moving from lying on your back to sitting on the side of a flat bed  without using bedrails?: A Little Help needed moving to and from a bed to a chair (including a wheelchair)?: A Little Help needed standing up from a chair using your arms (e.g., wheelchair or bedside chair)?: A Little Help needed to walk in hospital room?: A Lot Help needed climbing 3-5 steps with a railing? : A Lot 6 Click Score: 16    End of Session   Activity Tolerance: Patient tolerated treatment well;Patient limited by fatigue Patient left: in chair;with call bell/phone within reach;with family/visitor present Nurse Communication: Mobility status PT Visit Diagnosis: Muscle weakness (generalized) (M62.81);Other abnormalities of gait and mobility (R26.89);Difficulty in walking, not elsewhere classified (R26.2);Pain Pain - part of body:  (chronic back pain)    Time: 6962-9528 PT Time Calculation (min) (ACUTE ONLY): 25 min   Charges:   PT Evaluation $PT Eval Moderate Complexity: 1 Mod PT Treatments $Gait Training: 8-22 mins PT General Charges $$ ACUTE PT VISIT: 1 Visit         10/01/2023  Jacinto Halim., PT Acute Rehabilitation Services [phone]  (office)  Eliseo Gum Jorge Retz 10/01/2023, 12:39 PM

## 2023-10-01 NOTE — Progress Notes (Signed)
EMG report is under procedure tab. 

## 2023-10-01 NOTE — Evaluation (Signed)
Occupational Therapy Evaluation Patient Details Name: Jonathan Luna MRN: [id number] DOB: December 07, 1965 Today's Date: 10/01/2023   History of Present Illness Pt is a 57 y/o male admitted 12/18 with an worsening gait abnormality and ascending paralysis.  MRI results pending.  IVIG started.  MD suspecting GBS.  PMHx  HTN, HLD, chronic pain, neuropathy, gait abnormality.   Clinical Impression   Jonathan Luna was evaluated s/p the above admission list. He is indep at baseline; however over the past few weeks he has had progressive weakness, falls and needed to use a RW. Upon evaluation the pt was limited by limited activity tolerance, chronic back pain, distal paraesthesias, unsteady gait, weakness, BLE incoordination and BLE stiffness. Overall he needed close CGA for transfers and ambulation with the RW. Due to the deficits listed below the pt also needs up to min A for LB ADLs due to back pain with bending. Pt will benefit from continued acute OT services and intensive inpatient follow up therapy, >3 hours/day after discharge.         If plan is discharge home, recommend the following: A little help with walking and/or transfers;A little help with bathing/dressing/bathroom;Assistance with cooking/housework;Assistance with feeding;Direct supervision/assist for medications management;Direct supervision/assist for financial management;Assist for transportation;Help with stairs or ramp for entrance    Functional Status Assessment  Patient has had a recent decline in their functional status and demonstrates the ability to make significant improvements in function in a reasonable and predictable amount of time.  Equipment Recommendations       Recommendations for Other Services Rehab consult     Precautions / Restrictions Precautions Precautions: Fall Restrictions Weight Bearing Restrictions Per Provider Order: No      Mobility Bed Mobility Overal bed mobility: Needs Assistance Bed Mobility: Supine to  Sit, Sit to Supine     Supine to sit: Supervision Sit to supine: Supervision        Transfers Overall transfer level: Needs assistance   Transfers: Sit to/from Stand Sit to Stand: Contact guard assist                  Balance Overall balance assessment: Needs assistance Sitting-balance support: No upper extremity supported, Feet supported Sitting balance-Leahy Scale: Good     Standing balance support: Bilateral upper extremity supported, During functional activity, Reliant on assistive device for balance Standing balance-Leahy Scale: Poor                             ADL either performed or assessed with clinical judgement   ADL Overall ADL's : Needs assistance/impaired Eating/Feeding: Independent   Grooming: Contact guard assist;Standing   Upper Body Bathing: Set up;Sitting   Lower Body Bathing: Contact guard assist;Sit to/from stand   Upper Body Dressing : Set up;Sitting   Lower Body Dressing: Contact guard assist;Sit to/from stand   Toilet Transfer: Contact guard assist;Ambulation;Rolling walker (2 wheels)   Toileting- Clothing Manipulation and Hygiene: Contact guard assist;Sit to/from stand       Functional mobility during ADLs: Contact guard assist;Rolling walker (2 wheels) General ADL Comments: CGA for safety, mild LOBs noted     Vision Baseline Vision/History: 1 Wears glasses Vision Assessment?: Vision impaired- to be further tested in functional context Additional Comments: hx of vision problems, dysconjugate gaze     Perception Perception: Within Functional Limits       Praxis Praxis: [facility]       Pertinent Vitals/Pain Pain Assessment Pain Assessment: Faces Faces  Pain Scale: Hurts little more Pain Location: chronic back pain Pain Descriptors / Indicators: Grimacing Pain Intervention(s): Limited activity within patient's tolerance     Extremity/Trunk Assessment Upper Extremity Assessment Upper Extremity Assessment: LUE  deficits/detail;RUE deficits/detail RUE Deficits / Details: parathesias and "stiffness." WFL for basic functional tasks LUE Deficits / Details: parathesias and "stiffness." ROM is WFL, strength 4/5   Lower Extremity Assessment Lower Extremity Assessment: Defer to PT evaluation RLE Deficits / Details: incoordination, foot slap with higher steppage gait pattern.  general weakness. RLE Sensation: history of peripheral neuropathy RLE Coordination: decreased fine motor LLE Deficits / Details: Less incoordination, general weakness than R LE LLE Sensation: history of peripheral neuropathy LLE Coordination: decreased fine motor   Cervical / Trunk Assessment Cervical / Trunk Assessment: Normal   Communication Communication Communication: No apparent difficulties   Cognition Arousal: Alert Behavior During Therapy: WFL for tasks assessed/performed Overall Cognitive Status: No family/caregiver present to determine baseline cognitive functioning                                 General Comments: basic orientation WFL limited insight and attention noted. would benefit from higher level assessment     General Comments  VSS on RA            Home Living Family/patient expects to be discharged to:: Private residence Living Arrangements: Spouse/significant other Available Help at Discharge: Family Type of Home: House Home Access: Stairs to enter Secretary/administrator of Steps: 1   Home Layout: One level     Bathroom Shower/Tub: Chief Strategy Officer: Standard     Home Equipment: Grab bars - tub/shower;Rollator (4 wheels);Wheelchair - manual          Prior Functioning/Environment Prior Level of Function : Independent/Modified Independent;History of Falls (last six months)             Mobility Comments: ambulating with RW.5 falls in the last 3 weeks (without the RW); no fall since RW use ADLs Comments: mod I, wife assists with IADLs        OT  Problem List: Decreased strength;Decreased range of motion;Decreased activity tolerance;Impaired balance (sitting and/or standing);Decreased cognition;Decreased safety awareness;Decreased knowledge of use of DME or AE;Decreased knowledge of precautions      OT Treatment/Interventions: Self-care/ADL training;Neuromuscular education;Energy conservation;DME and/or AE instruction;Therapeutic activities;Patient/family education;Balance training    OT Goals(Current goals can be found in the care plan section) Acute Rehab OT Goals Patient Stated Goal: to get better OT Goal Formulation: With patient Time For Goal Achievement: 10/15/23 Potential to Achieve Goals: Good ADL Goals Pt Will Perform Grooming: with modified independence;standing Pt Will Perform Lower Body Dressing: sit to/from stand Pt Will Transfer to Toilet: with modified independence;ambulating Additional ADL Goal #1: Pt will indep complete IADL medication management task  OT Frequency: Min 1X/week       AM-PAC OT "6 Clicks" Daily Activity     Outcome Measure Help from another person eating meals?: None Help from another person taking care of personal grooming?: A Little Help from another person toileting, which includes using toliet, bedpan, or urinal?: A Little Help from another person bathing (including washing, rinsing, drying)?: A Little Help from another person to put on and taking off regular upper body clothing?: A Little Help from another person to put on and taking off regular lower body clothing?: A Little 6 Click Score: 19   End of Session Equipment Utilized During  Treatment: Rolling walker (2 wheels) Nurse Communication: Mobility status  Activity Tolerance: Patient tolerated treatment well Patient left: in chair;with call bell/phone within reach  OT Visit Diagnosis: Unsteadiness on feet (R26.81)                Time: 5366-4403 OT Time Calculation (min): 17 min Charges:  OT General Charges $OT Visit: 1 Visit OT  Evaluation $OT Eval Moderate Complexity: 1 Mod  Derenda Mis, OTR/L Acute Rehabilitation Services Office [phone] Secure Chat Communication Preferred   [name] 10/01/2023, 1:25 PM

## 2023-10-01 NOTE — Progress Notes (Signed)
Pt refused NIV and VC for the night only wants to do it during the morning

## 2023-10-01 NOTE — Progress Notes (Signed)
Pt achieved a NIF of -40 and VC of 2.5 L with great pt effort on all attempts. MD notified, RT will monitor as needed.

## 2023-10-01 NOTE — Progress Notes (Signed)
RT at bedside for scheduled NIF and VC. However pt off the floor. RT will attempt at next sched time.

## 2023-10-01 NOTE — Progress Notes (Signed)
Patient resting will continue NIF/VC at 0800.

## 2023-10-01 NOTE — Progress Notes (Signed)
? ?  Inpatient Rehab Admissions Coordinator : ? ?Per therapy recommendations, patient was screened for CIR candidacy by Blue Ruggerio RN MSN.  At this time patient appears to be a potential candidate for CIR. I will place a rehab consult per protocol for full assessment. Please call me with any questions. ? ?Jerriyah Louis RN MSN ?Admissions Coordinator ?[phone] ?  ?

## 2023-10-01 NOTE — Progress Notes (Signed)
NEUROLOGY CONSULT FOLLOW UP NOTE   Date of service: October 01, 2023 Patient Name: Jonathan Luna MRN:  [id number] DOB:  [birth year]  Brief HPI  Jonathan Luna is a 57 y.o. male  has a past medical history of Allergy, Anxiety, Arthritis, Cataract, Community acquired pneumonia (08/28/2023), Depression, Detached retina, Heart murmur, History of bilateral cataract extraction, Hyperlipidemia, Hypothyroidism, Low back pain, Panic attack, and Panic attacks. who presents  from his neurologist office for IVIG therapy for CIDP.  Patient reports that he got pneumonia in mid to late October and had multiple courses of antibiotics.  During that time, he started to have some weakness starting in his lower legs with his legs buckling at times when he walks.  This was initially attributed to his pneumonia.  He also noted numbness starting in his feet and ascending upwards as well as some numbness in the palms of the hands.  He feels that the weakness has progressed and now it is very difficult for him to ambulate without his legs buckling.  He states that his fingers are not very strong and that it is often difficult to keep his wrist straight when he is holding objects such as a drink bottle.  He reports no difficulty with swallowing but states that he gets out of breath with even mild activity such as taking a shower or walking to his kitchen.  He is, however, able to hold a conversation without running out of breath.    Interval Hx/subjective  - Patient required supplemental oxygen overnight - Patient tolerated his first dose of IVIG well without reported adverse effects - On neurology exam this morning 12/19, patient was noted to have a left NLF and mouth asymmetry that appeared to become more prominent throughout exam with unknown last known well time, different from yesterday per family at bedside. MRI brain ordered due to exam findings with recent IVIG administration and no further acute neurologic deficits noted. No  code stroke activation due to unknown last known well time, non-disabling acute deficit, no exam findings concerning for LVO.   Vitals   Vitals:   09/30/23 2003 09/30/23 2313 10/01/23 0410 10/01/23 0813  BP: 96/63 118/75 113/82 103/65  Pulse: 86 75 68 80  Resp: 18 19 16 19   Temp: 98.7 F (37.1 C) 98.4 F (36.9 C) (!) 97.5 F (36.4 C) 98.5 F (36.9 C)  TempSrc: Oral Oral Oral Oral  SpO2: (!) 88% 94% 96% 91%  Weight:   83.1 kg   Height:        Body mass index is 28.69 kg/m.  Physical Exam   Constitutional: Appears well-developed and well-nourished patient in no acute distress Psych: Affect appropriate to situation. Patient is calm and cooperative with exam.  Eyes: No scleral injection. Pupils irregular. Patient wears eyeglasses. HENT: No OP obstrucion.  Head: Normocephalic.  Cardiovascular: Normal rate and regular rhythm on bedside cardiac monitor.  Respiratory: Effort normal, non-labored breathing on 1L [place] supplemental oxygen.  GI: Soft.  No distension. There is no tenderness.  Skin: WDI.   Neurologic Examination  Mental Status: Patient is awake, alert, oriented to person, place, month, year, and situation. Patient is able to give a clear and coherent history. No signs of aphasia or neglect.  Cranial Nerves: II: Chronic superior visual field deficit. Pupils irregular bilaterally but briskly reactive to light, left pupil 6 mm and irregular, right pupil 4 mm III,IV, VI: Dysconjugate gaze, EOMI (right eye out with left eye midline due to chronic right  eye central vision loss) V: Facial sensation is intact and symmetric to light touch VII: Left mouth droop and NLF flattening  VIII: Hearing is intact to voice X: Palate elevates symmetrically XI: Shoulder shrug is symmetric. XII: Tongue protrudes midline without atrophy or fasciculations.  Motor: 4+/5 neck flexion and extension, 4/5 strength shoulder abduction bilaterally, 4/5 strength elbow extension and elbow flexion, 3/5  strength wrist extension, 4-/5 strength wrist flexion, 3/5 strength finger abduction, 4/5 strength hip flexion bilaterally, 4+/5 strength with knee flexion, 4+/5 strength knee extension, 4-/5 strength dorsiflexion, 4/5 strength plantarflexion Tone: is normal and bulk is normal.   Sensory: Burning and tingling to bilateral feet with light touch with decreased sensation to light touch on bilateral palms. Patient reports equal sensation to light touch in the arms and legs bilaterally.  Deep Tendon Reflexes: Areflexic Cerebellar: FNF ataxic bilaterally with jerking imprecise movements, worse on the left. Mild dysmetria with HKS bilaterally.   Medications  Current Facility-Administered Medications:    acetaminophen (TYLENOL) tablet 650 mg, 650 mg, Oral, Q6H PRN **OR** acetaminophen (TYLENOL) suppository 650 mg, 650 mg, Rectal, Q6H PRN, Synetta Fail, MD   acetaminophen (TYLENOL) tablet 1,000 mg, 1,000 mg, Oral, Once, Synetta Fail, MD   clonazePAM Scarlette Calico) tablet 0.5 mg, 0.5 mg, Oral, BID PRN, Marinda Elk, MD   enoxaparin (LOVENOX) injection 40 mg, 40 mg, Subcutaneous, Q24H, Synetta Fail, MD, 40 mg at 09/30/23 2051   Immune Globulin 10% (PRIVIGEN) IV infusion 25 g, 400 mg/kg (Ideal), Intravenous, Q24 Hr x 5, Synetta Fail, MD, Last Rate: 201 mL/hr at 09/30/23 1724, Rate Verify at 09/30/23 1724   levothyroxine (SYNTHROID) tablet 125 mcg, 125 mcg, Oral, Q0600, Synetta Fail, MD, 125 mcg at 10/01/23 0516   oxyCODONE-acetaminophen (PERCOCET/ROXICET) 5-325 MG per tablet 1 tablet, 1 tablet, Oral, Q8H PRN, Synetta Fail, MD, 1 tablet at 10/01/23 0516   polyethylene glycol (MIRALAX / GLYCOLAX) packet 17 g, 17 g, Oral, Daily PRN, Synetta Fail, MD   sertraline (ZOLOFT) tablet 25 mg, 25 mg, Oral, Daily, Synetta Fail, MD, 25 mg at 10/01/23 0916   sodium chloride flush (NS) 0.9 % injection 3 mL, 3 mL, Intravenous, Q12H, Synetta Fail, MD, 3 mL at  09/30/23 2200 Labs and Diagnostic Imaging   CBC:  Recent Labs  Lab 09/30/23 1553 09/30/23 1618 10/01/23 0529  WBC 10.4  --  6.4  NEUTROABS 4.7  --   --   HGB 15.4 15.0 14.3  HCT 44.7 44.0 41.9  MCV 90.3  --  89.5  PLT 354  --  323   Basic Metabolic Panel:  Lab Results  Component Value Date   NA 133 (L) 10/01/2023   K 3.6 10/01/2023   CO2 27 10/01/2023   GLUCOSE 85 10/01/2023   BUN 11 10/01/2023   CREATININE 0.79 10/01/2023   CALCIUM 9.0 10/01/2023   GFRNONAA >60 10/01/2023   GFRAA 94 08/16/2008   Lipid Panel:  Lab Results  Component Value Date   LDLCALC 104 (H) 01/26/2023   HgbA1c:  Lab Results  Component Value Date   HGBA1C 5.3 09/22/2023   Urine Drug Screen:     Component Value Date/Time   LABOPIA NEG 09/29/2012 0830   COCAINSCRNUR Negative 08/18/2023 1114   COCAINSCRNUR NEG 09/29/2012 0830   LABBENZ POS (A) 09/29/2012 0830   AMPHETMU NEG 09/29/2012 0830    Alcohol Level No results found for: "ETH" INR  Lab Results  Component Value Date   INR  1.0 08/29/2023   APTT  Lab Results  Component Value Date   APTT 32 08/29/2023   AED levels: No results found for: "PHENYTOIN", "ZONISAMIDE", "LAMOTRIGINE", "LEVETIRACETA"  MRI Brain(Personally reviewed): No acute abnormality   MRI cervical, thoracic and lumbar spine: Some minor disc degenerative changes but no significant spinal cord compression, most prominent at L4-L5 with bilateral foraminal narrowing and possible encroachment on nerve roots  NCS outpatient 12/18: "This is an abnormal study. 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 There is no evidence of axonal loss."  Assessment   Jonathan Luna is a 57 y.o. male with PMHx significant for Anxiety, Arthritis, Cataract, Depression, Detached retina, Heart murmur, History of bilateral cataract extraction, HLD, Hypothyroidism, Low back pain, and panic attacks who presents from his outpatient neurology office 12/18 for  IVIG for ongoing CIDP with approximately 2 months of ascending weakness and numbness in his arms and legs bilaterally and leg weakness affecting his gait significantly.  Patient states that when he attempts to ambulate, his legs constantly buckle and he reports burning and tingling of bilateral feet with numbness in bilateral palms.. Patient also complains of wrist weakness, arm weakness and incoordination.  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  On evaluation 12/19, the patient was noted to have left facial weakness of unknown duration and due to concern for IVIG precipitation of possible blood clotting and concern for potential stoke, an MRI brain was ordered.    Recommendations  - MRI brain to evaluate for possible stroke due to new left facial weakness - Continue IVIG for 5 doses- end date 12/22 - Will discuss LP with family at bedside - Continue NIF/VC q6h with new oxygen requirement - PT/OT - Neurology will continue to follow ______________________________________________________________________  Signed, Kara Mead, NP [facility]   Attending Neurohospitalist Addendum Patient seen and examined with APP/Resident. Agree with the history and physical as documented above. Agree with the plan as documented, which I helped formulate. I have edited the note above to reflect my full findings and recommendations. I have independently reviewed the chart, obtained history, review of systems and examined the patient.I have personally reviewed pertinent head/neck/spine imaging (CT/MRI). Please feel free to call with any questions.  -- Bing Neighbors, MD [facility] [phone]  If 7pm- 7am, please page neurology on call as listed in [facility].

## 2023-10-01 NOTE — Progress Notes (Signed)
[facility] PROGRESS NOTE    Progress Note  Jonathan Luna  ZHY:[id number] DOB: October 18, 1965 DOA: 09/30/2023 PCP: [name], MD     Brief Narrative:   Jonathan Luna is an 57 y.o. male past medical history of hypertension hypothyroidism neuropathy and gait abnormality, pneumonia treated with antibiotics in October, then again in November was admitted for sepsis and pneumonia and treated with antibiotics, was seen in the neurologist office for worsening gait abnormality and ascending paralysis started 1 month prior to admission presently getting worst the neurologist was concerned about AIDP Jonathan Luna variant sent to the ED for admission and IVIG.   Assessment/Plan:   CIDP (chronic inflammatory demyelinating polyneuropathy) (HCC) MRI of the L-spine showed degenerative disease most prominent L4-L5. MRI T-spine showed no significant changes. Neurology was consulted in the ED, recommended to started on IVIG for 5 days NiF of -40, continue negative inspiratory force and vital capacity every 12 hours. PT OT has been consulted  Essential hypertension: Hold antihypertensive medication, except for metoprolol. Blood pressure seems to be stable.  Hypothyroidism Continue Synthroid.  Chronic pain: Continue oxycodone.  Anxiety: Continue sertraline.   DVT prophylaxis: lovenox Family Communication:none Status is: Inpatient Remains inpatient appropriate because: Acute on chronic inflammatory demyelinating polyneuropathy    Code Status:     Code Status Orders  (From admission, onward)           Start     Ordered   09/30/23 1733  Full code  Continuous       Question:  By:  Answer:  Consent: discussion documented in EHR   09/30/23 1745           Code Status History     Date Active Date Inactive Code Status Order ID Comments User Context   08/28/2023 1940 08/30/2023 2205 Full Code [id number]  Nolberto Hanlon, MD ED         IV Access:   Peripheral  IV   Procedures and diagnostic studies:   CT CHEST WO CONTRAST Result Date: 09/30/2023 CLINICAL DATA:  Respiratory illness abnormal chest x-ray EXAM: CT CHEST WITHOUT CONTRAST TECHNIQUE: Multidetector CT imaging of the chest was performed following the standard protocol without IV contrast. RADIATION DOSE REDUCTION: This exam was performed according to the departmental dose-optimization program which includes automated exposure control, adjustment of the mA and/or kV according to patient size and/or use of iterative reconstruction technique. COMPARISON:  09/30/2023, CT chest 08/28/2023 FINDINGS: Cardiovascular: Limited assessment without intravenous contrast. Nonaneurysmal aorta. Normal cardiac size. Mild coronary vascular calcification. Trace pericardial effusion Mediastinum/Nodes: Patent trachea. No thyroid mass. No suspicious lymph nodes. Esophagus within normal limits. Lungs/Pleura: Emphysema. No pleural effusion or pneumothorax. Residual interstitial and minimal clustered nodularity in the posterior right upper lobe at the site of prior consolidation though improved significantly compared to the chest CT from November. Minimal bronchiectasis also in the region and some bandlike densities suggesting developing post infectious or inflammatory scarring. Upper Abdomen: No acute finding Musculoskeletal: No acute or suspicious osseous abnormality IMPRESSION: 1. Residual interstitial and minimal clustered nodularity in the posterior right upper lobe at the site of prior consolidation though improved significantly compared to the chest CT from November. Minimal bronchiectasis also in the region and some bandlike densities suggesting developing post infectious or inflammatory scarring. Recommend a follow-up chest CT in 3 months to ensure further resolution or document stability. 2. Emphysema. Aortic Atherosclerosis (ICD10-I70.0) and Emphysema (ICD10-J43.9). Electronically Signed   By: Jasmine Pang M.D.   On:  09/30/2023  20:22   DG Chest Portable 1 View Result Date: 09/30/2023 CLINICAL DATA:  Dyspnea on exertion EXAM: PORTABLE CHEST 1 VIEW COMPARISON:  Chest radiograph dated 10/29/2022. CTA chest dated 08/28/2023. FINDINGS: Persistent right upper lobe opacity, possibly improving infection/pneumonia, but underlying lesion/mass is not excluded. Consider CT chest for further evaluation. Left lung is clear.  No pleural effusion or pneumothorax. The heart is normal in size. IMPRESSION: Persistent right upper lobe opacity, possibly improving infection/pneumonia, but underlying lesion/mass is not excluded. Consider CT chest for further evaluation. Electronically Signed   By: Charline Bills M.D.   On: 09/30/2023 16:33   MR Lumbar Spine W Wo Contrast Result Date: 09/30/2023  [facility] [address] [place], [place] [address] [phone] NEUROIMAGING REPORT STUDY DATE: 09/29/2023 PATIENT NAME: Jonathan Luna DOB: 04-30-1966 MRN: [id number] ORDERING CLINICIAN: Dr. Terrace Arabia CLINICAL HISTORY: 57 year old patient being evaluated for gait abnormality COMPARISON FILMS: None EXAM: MRI lumbar spine with and without contrast TECHNIQUE: Sagittal T1, T2, STIR, axial T1, T2 and postcontrast sagittal and axial T1 images were obtained through lumbar spine CONTRAST: 15 mL IV MultiHance IMAGING SITE: [facility] at [address] FINDINGS: The lumbar vertebrae demonstrate loss of forward lordotic curvature with mild posterior subluxation of L3 over L2 vertebrae.  There are prominent disc degenerative changes throughout with endplate degenerative changes at L1-L2, L2/3 and L3-4. L1-L2 shows loss of disc height with degenerative change along with facet hypertrophy but no significant compression. . L2-3 also shows prominent disc degenerative change and endplate degenerative change along with facet hypertrophy resulting in mild left greater than right foraminal narrowing but no definite compression. L3-4 shows mild disc and  prominent endplate degenerative change along with facet hypertrophy but no definite compression. L4-5 shows mild disc and prominent facet degenerative changes resulting in moderate right and mild left-sided foraminal narrowing with only possible slight encroachment on exiting nerve roots L5-S1 shows mild disc and prominent facet degenerative change but no significant compression. The conus medullaris terminates at L1.  Paraspinal soft tissue appear unremarkable.  Visualized portion lower thoracic vertebrae also show minor disc degenerative changes.  Postcontrast images do not result in abnormal areas of enhancement   MRI scan of the lumbar spine with and without contrast showing prominent disc and facet degenerative changes throughout most prominent at L4-5 but there is moderate right and mild left-sided foraminal narrowing possible encroachment on exiting nerve roots. INTERPRETING PHYSICIAN: PRAMOD SETHI, MD Certified in  Neuroimaging by American Society of Neuroimaging and [employer]  MR THORACIC SPINE W WO CONTRAST Result Date: 09/30/2023  [facility] [address] [place], [place] [address] [phone] NEUROIMAGING REPORT STUDY DATE: 09/29/2023 PATIENT NAME: ALYAAN OCASIO DOB: 10-03-1966 MRN: [id number] ORDERING CLINICIAN: Dr. Terrace Arabia CLINICAL HISTORY: 58 year old patient being evaluated for gait abnormality COMPARISON FILMS: None EXAM: MRI thoracic spine with and without contrast TECHNIQUE: Sagittal T1, T2, STIR, axial T1, T2 and postcontrast axial and sagittal T1 images were obtained through thoracic spine CONTRAST: 15 mL IV MultiHance IMAGING SITE: [facility] at [address] FINDINGS: The thoracic vertebrae demonstrate normal alignment and body height and marrow signal characteristics except at T8-9 and T9-10 where there are mild endplate marrow degenerative changes.  There mild disc signal abnormalities but there is no frank disc herniation, cord  compression significant root or foraminal encroachment.  Paraspinal soft tissue appear unremarkable.  Postcontrast images do not result in abnormal areas of enhancement.  Spinal cord parenchyma shows no abnormal  signal intensities.  Visualized portion of the lower cervical and upper lumbar vertebrae also show minor disc degenerative changes.   MRI scan of thoracic spine with and without contrast showing only minor disc and endplate degenerative changes without significant compression. INTERPRETING PHYSICIAN: Delia Heady, MD Certified in  Neuroimaging by American Society of Neuroimaging and [employer]  NCV with EMG(electromyography) Result Date: 09/30/2023 Levert Feinstein, MD     09/30/2023  6:03 PM     Full Name: Mythias Khalid Gender: Male MRN #: [id number] Date of Birth: 1966-02-18   Visit Date: 09/30/2023 09:28 Age: 15 Years Examining Physician: Dr. Levert Feinstein Referring Physician: Dr. Levert Feinstein Height: 5 feet 7 inch History: 57 year old male presenting with subacute onset of gait abnormality in October 2024 Summary of the test: Nerve conduction study: Bilateral sural, superficial peroneal sensory responses were within normal limit, with upper limit of peak latency. Left radial sensory responses were normal.  Left ulnar, medial sensory responses showed mildly decreased snap amplitude. Bilateral tibial motor responses were normal at distal stimulation site, with evidence of temporal dispersion at proximal stimulation site.  Bilateral tibial F-wave latency was prolonged. Bilateral peroneal to EDB motor response showed significantly decreased CMAP amplitude. Left ulnar, median motor responses showed evidence of decreased CMAP amplitude, evidence of temporal dispersion at proximal stimulation site, with upper limit of left ulnar F-wave latency. Electromyography: Selected needle examinations were performed at left lower extremity muscles, lumbosacral paraspinal muscles;, left upper  extremity muscles and left cervical paraspinal muscles. There is no evidence of active denervation, but there is evidence of decreased recruitment patterns at all the muscle tested. There is noticeable polyphasic changes at left cervical and lumbar paraspinal muscles. Conclusion: This is an abnormal study.  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  There is no evidence of axonal loss. Levert Feinstein. M.D. Ph.D. [facility] [address] [place], [place] [address] Tel: [phone] Fax: [phone] Verbal informed consent was obtained from the patient, patient was informed of potential risk of procedure, including bruising, bleeding, hematoma formation, infection, muscle weakness, muscle pain, numbness, among others.     MNC   Nerve / Sites Muscle Latency Ref. Amplitude Ref. Rel Amp Segments Distance Velocity Ref. Area   ms ms mV mV %  cm m/s m/s mVms L Median - APB    Wrist APB 4.0 <=4.4 3.2 >=4.0 100 Wrist - APB 7   8.8    Upper arm APB 8.3  1.7  52 Upper arm - Wrist 20 46 >=49 5.5 L Ulnar - ADM    Wrist ADM 3.0 <=3.3 3.8 >=6.0 100 Wrist - ADM 7   15.1    B.Elbow ADM 4.6  1.2  31 B.Elbow - Wrist 11 69 >=49 3.4    A.Elbow ADM 8.3  2.9  247 A.Elbow - B.Elbow 19 52 >=49 13.3 L Peroneal - EDB    Ankle EDB 7.0 <=6.5 0.9 >=2.0 100 Ankle - EDB 9   3.0    Fib head EDB 14.4  0.5  63.7 Fib head - Ankle 24 32 >=44 1.7    Pop fossa EDB 17.1  0.6  112 Pop fossa - Fib head 12 45 >=44 1.5        Pop fossa - Ankle     R Peroneal - EDB    Ankle EDB 7.5 <=6.5 0.8 >=2.0 100 Ankle - EDB 9   2.2    Fib head EDB 13.3  0.5  64.9  Fib head - Ankle 23.6 40 >=44 2.5    Pop fossa EDB 15.1  0.7  152 Pop fossa - Fib head 10.6 60 >=44 3.4        Pop fossa - Ankle     L Tibial - AH    Ankle AH 4.9 <=5.8 4.1 >=4.0 100 Ankle - AH 9   14.8    Pop fossa AH 13.6  0.8  20.8 Pop fossa - Ankle 37 42 >=41 3.7 R Tibial - AH    Ankle AH 5.2 <=5.8 4.6 >=4.0 100 Ankle - AH 9   21.8    Pop fossa AH  15.7  1.4  30.1 Pop fossa - Ankle 37 35 >=41 4.8               SNC   Nerve / Sites Rec. Site Peak Lat Ref.  Amp Ref. Segments Distance   ms ms V V  cm L Radial - Anatomical snuff box (Forearm)    Forearm Wrist 2.3 <=2.9 16 >=15 Forearm - Wrist 10 L Sural - Ankle (Calf)    Calf Ankle 4.2 <=4.4 8 >=6 Calf - Ankle 14 R Sural - Ankle (Calf)    Calf Ankle 4.4 <=4.4 6 >=6 Calf - Ankle 14 L Superficial peroneal - Ankle    Lat leg Ankle 4.2 <=4.4 10 >=6 Lat leg - Ankle 14 R Superficial peroneal - Ankle    Lat leg Ankle 4.5 <=4.4 4 >=6 Lat leg - Ankle 14 L Median - Orthodromic (Dig II, Mid palm)    Dig II Wrist 3.1 <=3.4 4 >=10 Dig II - Wrist 13 L Ulnar - Orthodromic, (Dig V, Mid palm)    Dig V Wrist 2.8 <=3.1 3 >=5 Dig V - Wrist 33                 F  Wave   Nerve F Lat Ref.  ms ms L Tibial - AH 60.5 <=56.0 R Tibial - AH 61.0 <=56.0 L Ulnar - ADM 31.3 <=32.0 L Median - APB 31.9 <=31.0           EMG Summary Table   Spontaneous MUAP Recruitment Muscle IA Fib PSW Fasc Other Amp Dur. Poly Pattern L. Tibialis anterior Increased None None None _______ Increased Increased 1+ Reduced L. Peroneus longus Increased None None None _______ Increased Increased 1+ Reduced L. Gastrocnemius (Medial head) Increased None None None _______ Increased Increased 1+ Reduced L. Vastus lateralis Increased None None None _______ Increased Increased 1+ Reduced L. First dorsal interosseous Increased None None None _______ Increased Increased 1+ Reduced L. Extensor digitorum communis Increased None None None _______ Increased Increased 1+ Reduced L. Biceps brachii Increased None None None _______ Increased Increased 1+ Reduced L. Deltoid Increased None None None _______ Increased Increased 1+ Reduced L. Pronator teres Increased None None None _______ Increased Increased 1+ Reduced L. Cervical paraspinals Normal None None None _______ Normal Normal Normal Normal L. Lumbar paraspinals (low) Normal None None None _______ Increased Increased 1+ Normal L.  Lumbar paraspinals (mid) Increased None None None _______ Increased Increased 2+ Normal      Medical Consultants:   None.   Subjective:    JAMIERE BORING relates he feels better and is getting is tired  Objective:    Vitals:   09/30/23 1820 09/30/23 2003 09/30/23 2313 10/01/23 0410  BP:  96/63 118/75 113/82  Pulse:  86 75 68  Resp:  18 19 16   Temp: 97.9 F (36.6  C) 98.7 F (37.1 C) 98.4 F (36.9 C) (!) 97.5 F (36.4 C)  TempSrc: Oral Oral Oral Oral  SpO2:  (!) 88% 94% 96%  Weight:    83.1 kg  Height:       SpO2: 96 %   Intake/Output Summary (Last 24 hours) at 10/01/2023 0645 Last data filed at 10/01/2023 0411 Gross per 24 hour  Intake 240 ml  Output 1550 ml  Net -1310 ml   Filed Weights   09/30/23 1357 10/01/23 0410  Weight: 83.9 kg 83.1 kg    Exam: General exam: In no acute distress. Respiratory system: Good air movement and clear to auscultation. Cardiovascular system: S1 & S2 heard, RRR. No JVD. Gastrointestinal system: Abdomen is nondistended, soft and nontender.  Extremities: No pedal edema. Skin: No rashes, lesions or ulcers Psychiatry: Judgement and insight appear normal. Mood & affect appropriate.    Data Reviewed:    Labs: Basic Metabolic Panel: Recent Labs  Lab 09/30/23 1553 09/30/23 1618 10/01/23 0529  NA 132* 128* 133*  K 4.4 4.2 3.6  CL 97*  --  99  CO2 21*  --  27  GLUCOSE 81  --  85  BUN 8  --  11  CREATININE 0.78  --  0.79  CALCIUM 9.7  --  9.0   GFR Estimated Creatinine Clearance: 105 mL/min (by C-G formula based on SCr of 0.79 mg/dL). Liver Function Tests: Recent Labs  Lab 10/01/23 0529  AST 21  ALT 18  ALKPHOS 54  BILITOT 0.8  PROT 7.3  ALBUMIN 3.6   No results for input(s): "LIPASE", "AMYLASE" in the last 168 hours. No results for input(s): "AMMONIA" in the last 168 hours. Coagulation profile No results for input(s): "INR", "PROTIME" in the last 168 hours. COVID-19 Labs  No results for input(s): "DDIMER",  "FERRITIN", "LDH", "CRP" in the last 72 hours.  Lab Results  Component Value Date   SARSCOV2NAA NEGATIVE 09/30/2023   SARSCOV2NAA NEGATIVE 08/28/2023   SARSCOV2NAA Not Detected 02/27/2022    CBC: Recent Labs  Lab 09/30/23 1553 09/30/23 1618 10/01/23 0529  WBC 10.4  --  6.4  NEUTROABS 4.7  --   --   HGB 15.4 15.0 14.3  HCT 44.7 44.0 41.9  MCV 90.3  --  89.5  PLT 354  --  323   Cardiac Enzymes: No results for input(s): "CKTOTAL", "CKMB", "CKMBINDEX", "TROPONINI" in the last 168 hours. BNP (last 3 results) Recent Labs    09/04/23 1021  PROBNP 6.0   CBG: No results for input(s): "GLUCAP" in the last 168 hours. D-Dimer: No results for input(s): "DDIMER" in the last 72 hours. Hgb A1c: No results for input(s): "HGBA1C" in the last 72 hours. Lipid Profile: No results for input(s): "CHOL", "HDL", "LDLCALC", "TRIG", "CHOLHDL", "LDLDIRECT" in the last 72 hours. Thyroid function studies: No results for input(s): "TSH", "T4TOTAL", "T3FREE", "THYROIDAB" in the last 72 hours.  Invalid input(s): "FREET3" Anemia work up: No results for input(s): "VITAMINB12", "FOLATE", "FERRITIN", "TIBC", "IRON", "RETICCTPCT" in the last 72 hours. Sepsis Labs: Recent Labs  Lab 09/30/23 1552 09/30/23 1553 10/01/23 0529  WBC  --  10.4 6.4  LATICACIDVEN 1.7  --   --    Microbiology Recent Results (from the past 240 hours)  Resp panel by RT-PCR (RSV, Flu A&B, Covid) Anterior Nasal Swab     Status: None   Collection Time: 09/30/23  4:40 PM   Specimen: Anterior Nasal Swab  Result Value Ref Range Status   SARS Coronavirus  2 by RT PCR NEGATIVE NEGATIVE Final   Influenza A by PCR NEGATIVE NEGATIVE Final   Influenza B by PCR NEGATIVE NEGATIVE Final    Comment: (NOTE) The Xpert Xpress SARS-CoV-2/FLU/RSV plus assay is intended as an aid in the diagnosis of influenza from Nasopharyngeal swab specimens and should not be used as a sole basis for treatment. Nasal washings and aspirates are  unacceptable for Xpert Xpress SARS-CoV-2/FLU/RSV testing.  Fact Sheet for Patients: BloggerCourse.com  Fact Sheet for Healthcare Providers: SeriousBroker.it  This test is not yet approved or cleared by the [place] FDA and has been authorized for detection and/or diagnosis of SARS-CoV-2 by FDA under an Emergency Use Authorization (EUA). This EUA will remain in effect (meaning this test can be used) for the duration of the COVID-19 declaration under Section 564(b)(1) of the Act, 21 U.S.C. section 360bbb-3(b)(1), unless the authorization is terminated or revoked.     Resp Syncytial Virus by PCR NEGATIVE NEGATIVE Final    Comment: (NOTE) Fact Sheet for Patients: BloggerCourse.com  Fact Sheet for Healthcare Providers: SeriousBroker.it  This test is not yet approved or cleared by the [place] FDA and has been authorized for detection and/or diagnosis of SARS-CoV-2 by FDA under an Emergency Use Authorization (EUA). This EUA will remain in effect (meaning this test can be used) for the duration of the COVID-19 declaration under Section 564(b)(1) of the Act, 21 U.S.C. section 360bbb-3(b)(1), unless the authorization is terminated or revoked.  Performed at [facility], [address] [place], [place] [address]      Medications:    acetaminophen  1,000 mg Oral Once   enoxaparin (LOVENOX) injection  40 mg Subcutaneous Q24H   levothyroxine  125 mcg Oral Q0600   sertraline  25 mg Oral Daily   sodium chloride flush  3 mL Intravenous Q12H   Continuous Infusions:  Immune Globulin 10% 201 mL/hr at 09/30/23 1724      LOS: 1 day   Marinda Elk  [facility]  10/01/2023, 6:45 AM

## 2023-10-02 DIAGNOSIS — R292 Abnormal reflex: Secondary | ICD-10-CM | POA: Diagnosis not present

## 2023-10-02 DIAGNOSIS — G610 Guillain-Barre syndrome: Secondary | ICD-10-CM | POA: Diagnosis not present

## 2023-10-02 LAB — MULTIPLE MYELOMA PANEL, SERUM
Albumin SerPl Elph-Mcnc: 4.1 g/dL (ref 2.9–4.4)
Albumin/Glob SerPl: 1.3 (ref 0.7–1.7)
Alpha 1: 0.2 g/dL (ref 0.0–0.4)
Alpha2 Glob SerPl Elph-Mcnc: 0.7 g/dL (ref 0.4–1.0)
B-Globulin SerPl Elph-Mcnc: 1.3 g/dL (ref 0.7–1.3)
Gamma Glob SerPl Elph-Mcnc: 1 g/dL (ref 0.4–1.8)
Globulin, Total: 3.3 g/dL (ref 2.2–3.9)
IgA/Immunoglobulin A, Serum: 571 mg/dL — ABNORMAL HIGH (ref 90–386)
IgG (Immunoglobin G), Serum: 965 mg/dL (ref 603–1613)
IgM (Immunoglobulin M), Srm: 157 mg/dL (ref 20–172)
Total Protein: 7.4 g/dL (ref 6.0–8.5)

## 2023-10-02 LAB — ANCA PROFILE
Anti-MPO Antibodies: <0.2 U (ref 0.0–0.9)
Anti-PR3 Antibodies: <0.2 U (ref 0.0–0.9)
Atypical pANCA: <1:20 {titer}
C-ANCA: <1:20 {titer}
P-ANCA: <1:20 {titer}

## 2023-10-02 LAB — CK: Total CK: 113 U/L (ref 41–331)

## 2023-10-02 LAB — VITAMIN D 25 HYDROXY (VIT D DEFICIENCY, FRACTURES): Vit D, 25-Hydroxy: 40.6 ng/mL (ref 30.0–100.0)

## 2023-10-02 LAB — C-REACTIVE PROTEIN: CRP: <1 mg/L (ref 0–10)

## 2023-10-02 LAB — ANA W/REFLEX IF POSITIVE: Anti Nuclear Antibody (ANA): NEGATIVE

## 2023-10-02 LAB — HIV ANTIBODY (ROUTINE TESTING W REFLEX): HIV Screen 4th Generation wRfx: NONREACTIVE

## 2023-10-02 LAB — LYME DISEASE SEROLOGY W/REFLEX: Lyme Total Antibody EIA: NEGATIVE

## 2023-10-02 LAB — SEDIMENTATION RATE: Sed Rate: 22 mm/h (ref 0–30)

## 2023-10-02 LAB — HGB A1C W/O EAG: Hgb A1c MFr Bld: 5.3 % (ref 4.8–5.6)

## 2023-10-02 LAB — RPR: RPR Ser Ql: NONREACTIVE

## 2023-10-02 LAB — TSH: TSH: 8.6 u[IU]/mL — ABNORMAL HIGH (ref 0.450–4.500)

## 2023-10-02 MED ORDER — METOPROLOL SUCCINATE ER 25 MG PO TB24
25.0000 mg | ORAL_TABLET | Freq: Every day | ORAL | Status: DC
Start: 2023-10-02 — End: 2023-10-05
  Administered 2023-10-02 – 2023-10-05 (×4): 25 mg via ORAL
  Filled 2023-10-02 (×4): qty 1

## 2023-10-02 MED ORDER — IRBESARTAN 300 MG PO TABS
300.0000 mg | ORAL_TABLET | Freq: Every day | ORAL | Status: DC
  Administered 2023-10-02 – 2023-10-05 (×4): 300 mg via ORAL
  Filled 2023-10-02 (×4): qty 1

## 2023-10-02 MED FILL — Immune Globulin (Human) IV Soln 20 GM/200ML: INTRAVENOUS | Qty: 200 | Status: AC

## 2023-10-02 MED FILL — Immune Globulin (Human) IV Soln 5 GM/50ML: INTRAVENOUS | Qty: 50 | Status: AC

## 2023-10-02 NOTE — PMR Pre-admission (Signed)
PMR Admission Coordinator Pre-Admission Assessment  Patient: Jonathan Luna is an 57 y.o., male MRN: [id number] DOB: 1966-07-05 Height: 5\' 7"  (170.2 cm) Weight: 83.1 kg              Insurance Information HMO: ***    PPO: ***     PCP:      IPA:      80/20:      OTHER:  PRIMARY: [employer]      Policy#: [id number]      Subscriber: pt CM Name: ***      Phone#: ***     Fax#: *** Pre-Cert#: ***      Employer:  Benefits:  Phone #: [phone]     Name:  Eff. Date: ***     Deduct: ***      Out of Pocket Max: ***      Life Max: ***  CIR: ***      SNF: *** Outpatient: ***     Co-Pay: *** Home Health: ***      Co-Pay: *** DME: ***     Co-Pay: *** Providers: *** SECONDARY:       Policy#:       Phone#:   Financial Counselor:       Phone#:   The "Data Collection Information Summary" for patients in Inpatient Rehabilitation Facilities with attached "Privacy Act Statement-Health Care Records" was provided and verbally reviewed with: Patient and Family  Emergency Contact Information Contact Information     Name Relation Home Work Mobile   Stene,Karen Spouse [phone]  [phone]   Dennys, Hawkins   [phone]      Other Contacts   None on File    Current Medical History  Patient Admitting Diagnosis: CIDP  History of Present Illness: Pt is a 39 y/ male with PMH of HTN, HLD, chronic pain, neuropathy who admitted to [facility] on 09/30/23 from his neurologist office for 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  Hemodynamically stable in ED.  MRI C/T/L outpatient showed some minor disc degenerative changes but no significant cord compression.  Neurology inpatient consulted and recommended IVIG x5 days (last date 12/22) and close monitoring of respiratory status/NiF/VC.   On 12-19, patient developed left NLF and mouth asymmetry, neurology concern for possible blood clotting and stroke secondary to IVIG, however MRI brain was performed with no acute findings.  Patient is being maintained on intermittent oxygen with last  recorded and if -40 VC 2.5 L.  Therapy evaluations were completed and pt was recommended for CIR.       Patient's medical record from [facility] has been reviewed by the rehabilitation admission coordinator and physician.  Past Medical History  Past Medical History:  Diagnosis Date   Allergy    Anxiety    Arthritis    Cataract    Community acquired pneumonia 08/28/2023   Depression    Detached retina    Heart murmur    History of bilateral cataract extraction    Hyperlipidemia    Hypothyroidism    Low back pain    Panic attack    Panic attacks     Has the patient had major surgery during 100 days prior to admission? No  Family History  family history includes Asthma in his father; Breast cancer in his mother; Diabetes in his brother; Emphysema in his father; Heart disease in his father; Hyperlipidemia in his brother; Hypertension in his father.   Current Medications   Current Facility-Administered Medications:    acetaminophen (TYLENOL) tablet 650 mg,  650 mg, Oral, Q6H PRN **OR** acetaminophen (TYLENOL) suppository 650 mg, 650 mg, Rectal, Q6H PRN, Synetta Fail, MD   acetaminophen (TYLENOL) tablet 1,000 mg, 1,000 mg, Oral, Once, Synetta Fail, MD   clonazePAM Scarlette Calico) tablet 0.5 mg, 0.5 mg, Oral, BID PRN, Marinda Elk, MD, 0.5 mg at 10/01/23 2312   enoxaparin (LOVENOX) injection 40 mg, 40 mg, Subcutaneous, Q24H, Synetta Fail, MD, 40 mg at 10/01/23 2316   hydrocortisone cream 0.5 %, , Topical, Daily PRN, Marinda Elk, MD   Immune Globulin 10% (PRIVIGEN) IV infusion 25 g, 400 mg/kg (Ideal), Intravenous, Q24 Hr x 5, Synetta Fail, MD, Last Rate: 201 mL/hr at 09/30/23 1724, 25 g at 10/01/23 1939   irbesartan (AVAPRO) tablet 300 mg, 300 mg, Oral, Daily, Marinda Elk, MD, 300 mg at 10/02/23 4540   levothyroxine (SYNTHROID) tablet 125 mcg, 125 mcg, Oral, Q0600, Synetta Fail, MD, 125 mcg at 10/02/23 9811   metoprolol  succinate (TOPROL-XL) 24 hr tablet 25 mg, 25 mg, Oral, Daily, Marinda Elk, MD, 25 mg at 10/02/23 9147   oxyCODONE-acetaminophen (PERCOCET/ROXICET) 5-325 MG per tablet 1 tablet, 1 tablet, Oral, Q8H PRN, Synetta Fail, MD, 1 tablet at 10/02/23 0645   polyethylene glycol (MIRALAX / GLYCOLAX) packet 17 g, 17 g, Oral, Daily PRN, Synetta Fail, MD   sertraline (ZOLOFT) tablet 25 mg, 25 mg, Oral, Daily, Synetta Fail, MD, 25 mg at 10/02/23 8295   sodium chloride flush (NS) 0.9 % injection 3 mL, 3 mL, Intravenous, Q12H, Synetta Fail, MD, 3 mL at 10/02/23 6213  Patients Current Diet:  Diet Order             Diet regular Room service appropriate? Yes; Fluid consistency: Thin  Diet effective now                   Precautions / Restrictions Precautions Precautions: Fall Restrictions Weight Bearing Restrictions Per Provider Order: No   Has the patient had 2 or more falls or a fall with injury in the past year?Yes  Prior Activity Level Community (5-7x/wk): independent prior to illness in October, driving, no DME  Prior Functional Level Prior Function Prior Level of Function : Independent/Modified Independent, History of Falls (last six months) Mobility Comments: ambulating with RW.5 falls in the last 3 weeks (without the RW); no fall since RW use ADLs Comments: mod I, wife assists with IADLs  Self Care: Did the patient need help bathing, dressing, using the toilet or eating?  Independent  Indoor Mobility: Did the patient need assistance with walking from room to room (with or without device)? Independent  Stairs: Did the patient need assistance with internal or external stairs (with or without device)? Independent  Functional Cognition: Did the patient need help planning regular tasks such as shopping or remembering to take medications? Independent  Patient Information Are you of Hispanic, Latino/a,or Spanish origin?: A. No, not of Hispanic, Latino/a,  or Spanish origin What is your race?: A. White Do you need or want an interpreter to communicate with a doctor or health care staff?: 0. No  Patient's Response To:  Health Literacy and Transportation Is the patient able to respond to health literacy and transportation needs?: Yes Health Literacy - How often do you need to have someone help you when you read instructions, pamphlets, or other written material from your doctor or pharmacy?: Never In the past 12 months, has lack of transportation kept you from medical appointments  or from getting medications?: No In the past 12 months, has lack of transportation kept you from meetings, work, or from getting things needed for daily living?: No  Journalist, newspaper / Equipment Home Equipment: Grab bars - tub/shower, Rollator (4 wheels), Wheelchair - manual  Prior Device Use: Indicate devices/aids used by the patient prior to current illness, exacerbation or injury?  Immediately PTA using a RW but does not typically use  Current Functional Level Cognition  Overall Cognitive Status: No family/caregiver present to determine baseline cognitive functioning Orientation Level: Oriented X4 General Comments: basic orientation WFL limited insight and attention noted. would benefit from higher level assessment    Extremity Assessment (includes Sensation/Coordination)  Upper Extremity Assessment: LUE deficits/detail, RUE deficits/detail RUE Deficits / Details: parathesias and "stiffness." WFL for basic functional tasks LUE Deficits / Details: parathesias and "stiffness." ROM is WFL, strength 4/5  Lower Extremity Assessment: Defer to PT evaluation RLE Deficits / Details: incoordination, foot slap with higher steppage gait pattern.  general weakness. RLE Sensation: history of peripheral neuropathy RLE Coordination: decreased fine motor LLE Deficits / Details: Less incoordination, general weakness than R LE LLE Sensation: history of peripheral  neuropathy LLE Coordination: decreased fine motor    ADLs  Overall ADL's : Needs assistance/impaired Eating/Feeding: Independent Grooming: Contact guard assist, Standing Upper Body Bathing: Set up, Sitting Lower Body Bathing: Contact guard assist, Sit to/from stand Upper Body Dressing : Set up, Sitting Lower Body Dressing: Sit to/from stand, Minimal assistance Toilet Transfer: Contact guard assist, Ambulation, Rolling walker (2 wheels) Toileting- Clothing Manipulation and Hygiene: Contact guard assist, Sit to/from stand Functional mobility during ADLs: Contact guard assist, Rolling walker (2 wheels) General ADL Comments: CGA for safety, mild LOBs noted    Mobility  Overal bed mobility: Needs Assistance Bed Mobility: Supine to Sit, Sit to Supine Supine to sit: Supervision Sit to supine: Supervision General bed mobility comments: OOB in the chair on arrival    Transfers  Overall transfer level: Needs assistance Transfers: Sit to/from Stand Sit to Stand: Contact guard assist General transfer comment: cues for safety/hand placement    Ambulation / Gait / Stairs / Wheelchair Mobility  Ambulation/Gait Ambulation/Gait assistance: Min assist, Mod assist (mod as he fatigue and began to be tremulous.) Gait Distance (Feet): 150 Feet (100 additional feet) Assistive device: Rolling walker (2 wheels) Gait Pattern/deviations: Step-through pattern General Gait Details: mildly unsteady, progressing to tremulous and less steady.  R steppage gait with foot slap.  Cues for posture and proximity to the RW--improved stability immediately until fatigue. Gait velocity interpretation: <1.8 ft/sec, indicate of risk for recurrent falls    Posture / Balance Balance Overall balance assessment: Needs assistance Sitting-balance support: No upper extremity supported, Feet supported Sitting balance-Leahy Scale: Good Standing balance support: Bilateral upper extremity supported, During functional activity,  Reliant on assistive device for balance Standing balance-Leahy Scale: Poor    Special needs/care consideration N/a     Previous Home Environment (from acute therapy documentation) Living Arrangements: Spouse/significant other Available Help at Discharge: Family Type of Home: House Home Layout: One level Home Access: Stairs to enter Secretary/administrator of Steps: 1 Bathroom Shower/Tub: Engineer, manufacturing systems: Standard  Discharge Living Setting Plans for Discharge Living Setting: Patient's home, Lives with (comment) (spouse) Type of Home at Discharge: House Discharge Home Layout: One level Discharge Home Access: Stairs to enter Entrance Stairs-Rails: None Entrance Stairs-Number of Steps: 1 Discharge Bathroom Shower/Tub: Tub/shower unit Discharge Bathroom Toilet: Standard Discharge Bathroom Accessibility: Yes How Accessible: Accessible via  walker Does the patient have any problems obtaining your medications?: No  Social/Family/Support Systems Patient Roles: Spouse Anticipated Caregiver: spouse and brother Anticipated Caregiver's Contact Information: Clydie Braun [phone]; Trey Paula [phone] Caregiver Availability: 24/7 Discharge Plan Discussed with Primary Caregiver: Yes Is Caregiver In Agreement with Plan?: Yes Does Caregiver/Family have Issues with Lodging/Transportation while Pt is in Rehab?: No   Goals Patient/Family Goal for Rehab: PT/OT supervision to mod I Expected length of stay: 10-14 days Additional Information: Discharge plan: return to patient's home, spouse and pt's brother can provide 24/7 Pt/Family Agrees to Admission and willing to participate: Yes Program Orientation Provided & Reviewed with Pt/Caregiver Including Roles  & Responsibilities: Yes   Decrease burden of Care through IP rehab admission: n/a   Possible need for SNF placement upon discharge: Not anticipated.  Plan for discharge home with support from spouse and brother.    Patient  Condition: This patient's condition remains as documented in the consult dated 10/02/23, in which the Rehabilitation Physician determined and documented that the patient's condition is appropriate for intensive rehabilitative care in an inpatient rehabilitation facility. Will admit to inpatient rehab today.  Preadmission Screen Completed By:  Stephania Fragmin, PT, DPT 10/02/2023 2:36 PM ______________________________________________________________________   Discussed status with Dr. Marland Kitchenon***at *** and received approval for admission today.  Admission Coordinator:  Stephania Fragmin, time***/Date***

## 2023-10-02 NOTE — Progress Notes (Signed)
Physical Therapy Treatment Patient Details Name: Jonathan Luna MRN: [id number] DOB: 10/08/1966 Today's Date: 10/02/2023   History of Present Illness Pt is a 57 y/o male admitted 12/18 with an worsening gait abnormality and ascending paralysis.  MRI results pending.  IVIG started.  MD suspecting GBS.  PMHx  HTN, HLD, chronic pain, neuropathy, gait abnormality.    PT Comments  Pt progressing steadily toward goals.  Emphasis on transition OOB, scooting, balance, sit to stand safety, progression of gait stability, quality, stamina   If plan is discharge home, recommend the following: A little help with walking and/or transfers;A little help with bathing/dressing/bathroom;Assistance with cooking/housework;Help with stairs or ramp for entrance   Can travel by private vehicle        Equipment Recommendations  Other (comment)    Recommendations for Other Services       Precautions / Restrictions Precautions Precautions: Fall     Mobility  Bed Mobility Overal bed mobility: Needs Assistance Bed Mobility: Supine to Sit     Supine to sit: Supervision          Transfers Overall transfer level: Needs assistance Equipment used: Rolling walker (2 wheels) Transfers: Sit to/from Stand Sit to Stand: Contact guard assist           General transfer comment: cues for safety/hand placement    Ambulation/Gait   Gait Distance (Feet): 150 Feet (and additional 130 after sitting rest.) Assistive device: Rolling walker (2 wheels) Gait Pattern/deviations: Step-through pattern   Gait velocity interpretation: <1.8 ft/sec, indicate of risk for recurrent falls   General Gait Details: mildly unsteady with flat footed contact, cued for better heel/toe, but still tight and sore from bil mild hyperextension at each knee.   Stairs             Wheelchair Mobility     Tilt Bed    Modified Rankin (Stroke Patients Only)       Balance Overall balance assessment: Needs  assistance Sitting-balance support: No upper extremity supported Sitting balance-Leahy Scale: Good     Standing balance support: Single extremity supported, Bilateral upper extremity supported Standing balance-Leahy Scale: Poor                              Cognition Arousal: Alert Behavior During Therapy: WFL for tasks assessed/performed Overall Cognitive Status: Within Functional Limits for tasks assessed                                          Exercises      General Comments General comments (skin integrity, edema, etc.): Improvement in tremors overall and less tremor with fatigue, though still remains.      Pertinent Vitals/Pain Pain Assessment Pain Assessment: Faces Faces Pain Scale: Hurts a little bit Pain Location: chronic back pain Pain Descriptors / Indicators: Grimacing Pain Intervention(s): Monitored during session    Home Living Family/patient expects to be discharged to:: Private residence Living Arrangements: Spouse/significant other Available Help at Discharge: Family Type of Home: House Home Access: Stairs to enter Entrance Stairs-Rails: Right Entrance Stairs-Number of Steps: 1   Home Layout: One level        Prior Function            PT Goals (current goals can now be found in the care plan section) Acute Rehab PT Goals PT Goal  Formulation: With patient Time For Goal Achievement: 10/15/23 Potential to Achieve Goals: Good Progress towards PT goals: Progressing toward goals    Frequency    Min 1X/week      PT Plan      Co-evaluation              AM-PAC PT "6 Clicks" Mobility   Outcome Measure  Help needed turning from your back to your side while in a flat bed without using bedrails?: A Little Help needed moving from lying on your back to sitting on the side of a flat bed without using bedrails?: A Little Help needed moving to and from a bed to a chair (including a wheelchair)?: A Little Help  needed standing up from a chair using your arms (e.g., wheelchair or bedside chair)?: A Little Help needed to walk in hospital room?: A Little Help needed climbing 3-5 steps with a railing? : A Lot 6 Click Score: 17    End of Session   Activity Tolerance: Patient tolerated treatment well;Patient limited by fatigue Patient left: in chair;with call bell/phone within reach;with family/visitor present Nurse Communication: Mobility status PT Visit Diagnosis: Muscle weakness (generalized) (M62.81);Difficulty in walking, not elsewhere classified (R26.2);Other symptoms and signs involving the nervous system (R29.898) Pain - part of body:  (back)     Time: 1443-1510 PT Time Calculation (min) (ACUTE ONLY): 27 min  Charges:    $Gait Training: 8-22 mins $Therapeutic Activity: 8-22 mins PT General Charges $$ ACUTE PT VISIT: 1 Visit                     10/02/2023  Jacinto Halim., PT Acute Rehabilitation Services [phone]  (office)   Jonathan Luna 10/02/2023, 5:44 PM

## 2023-10-02 NOTE — Progress Notes (Signed)
NEUROLOGY CONSULT FOLLOW UP NOTE   Date of service: October 02, 2023 Patient Name: Jonathan Luna MRN:  [id number] DOB:  [birth year]  Brief HPI  Jonathan Luna is a 57 y.o. male with {MH significant for Pneumonia (November), HLD, hypothyroidism, Peripheral Neuropathy, B/l Panic attacks, anxiety, low back pain who presented from neurologist office for IVIG therapy for CIDP.  Pateint reportes weakness starting in his lower legs with his legs bucking during and since he has penumonia and was on multiple courses of antibiotics in late Oct/early Nov. He was hospitalized for the pneumonia at [facility] for 3 days. He feels that his weakness has progressed and ambulation has become very difficult for him with weakness now noted to his wrists. No difficulty with swallowing reported, but does have SOB when even mild activity.   Interval Hx/subjective    - Started on IVIG 12/18, has now received 2/5 doses.   Patient states he feels stronger and has been able to walk with a walker to the bathroom and in the hallway. While speaking with him, he did not become short of breath. Against resistance strength in all extremities. Vibration sensation in LLE and shaking still persists (bilateral hands), but patient states they have improved.   We have contacted Dr. Terrace Arabia regarding LP to ensure all of her required tests are also completed inpatient.    Vitals   Vitals:   10/01/23 1846 10/01/23 2241 10/02/23 0319 10/02/23 0751  BP: 134/66 (!) 106/90 113/67 (!) 155/81  Pulse:  (!) 102 75 92  Resp:  15 18 20   Temp:  98.5 F (36.9 C) 98.3 F (36.8 C) 97.7 F (36.5 C)  TempSrc:  Oral Oral Oral  SpO2:  96% 97% 95%  Weight:      Height:         Body mass index is 28.69 kg/m.  Physical Exam   Constitutional: Appears well-developed and well-nourished.  Psych: Affect appropriate to situation. Slightly anxious regarding further treatment and increased healthcare bills.  Eyes: No scleral injection. HENT: No OP obstrucion.   Head: Normocephalic.  Cardiovascular: ST on monitor.  Respiratory: Effort normal, non-labored breathing. 2L [place].  GI: Soft.  No distension. There is no tenderness.  Skin: WDI (clothed on exam).   Neurologic Examination   Neuro: Mental Status: Patient is awake, alert, oriented to person, place, month, year, and situation. Patient is able to give a clear and coherent history. No signs of aphasia or neglect Cranial Nerves: I: chronic visual field deficit. Glasses at baseline that improve patient to be able to read, etc.  Pupils are equal, round, and reactive to light with left pupil larger than right. Bilateral irregular shape.  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 No ptosis or diploplia.  V: Facial sensation is symmetric to temperature VII: slight left lower facial droop VIII: hearing is intact to voice X: Uvula elevates symmetrically XI: Shoulder shrug is symmetric. XII: tongue is midline without atrophy or fasciculations.  Motor: Neck flexion/extension: 4+/5 )no pain noted with movement BUE: 4+/5 shoulder, 4+/5 bicep/tricep, 4/5 grip, 4-/5 wrist flexion and extension, 4/5 finger abduction.  BLE: 4/5 hip flexion, 4+/5 knee flexion/extension, 4-/5 dorsal flexion, 4/5 plantar flexion. Normal tone and bulk.  Sensory: Sensation is symmetric to light touch and temperature in the hands, arms and legs. Vibration sensation noted in left thigh, superior Deep Tendon Reflexes: Areflexia continues. Perhaps slight reflexic movement in lower extremities, but very slight and inconsistent.  Cerebellar:  FNF intact bilaterally with shaking noted bilaterally, worse in left hand than in right. Some dysmetria with HKS, but noted to be improved by patient.    Medications  Current Facility-Administered Medications:    acetaminophen (TYLENOL) tablet 650 mg, 650 mg, Oral, Q6H PRN **OR** acetaminophen (TYLENOL) suppository 650 mg, 650 mg, Rectal, Q6H PRN,  Jonathan Fail, MD   acetaminophen (TYLENOL) tablet 1,000 mg, 1,000 mg, Oral, Once, Jonathan Fail, MD   clonazePAM Scarlette Calico) tablet 0.5 mg, 0.5 mg, Oral, BID PRN, Jonathan Elk, MD, 0.5 mg at 10/01/23 2312   enoxaparin (LOVENOX) injection 40 mg, 40 mg, Subcutaneous, Q24H, Jonathan Fail, MD, 40 mg at 10/01/23 2316   hydrocortisone cream 0.5 %, , Topical, Daily PRN, Jonathan Elk, MD   Immune Globulin 10% (PRIVIGEN) IV infusion 25 g, 400 mg/kg (Ideal), Intravenous, Q24 Hr x 5, Jonathan Fail, MD, Last Rate: 201 mL/hr at 09/30/23 1724, 25 g at 10/01/23 1939   irbesartan (AVAPRO) tablet 300 mg, 300 mg, Oral, Daily, Jonathan Elk, MD   levothyroxine (SYNTHROID) tablet 125 mcg, 125 mcg, Oral, Q0600, Jonathan Fail, MD, 125 mcg at 10/02/23 1610   metoprolol succinate (TOPROL-XL) 24 hr tablet 25 mg, 25 mg, Oral, Daily, Jonathan Elk, MD   oxyCODONE-acetaminophen (PERCOCET/ROXICET) 5-325 MG per tablet 1 tablet, 1 tablet, Oral, Q8H PRN, Jonathan Fail, MD, 1 tablet at 10/02/23 0645   polyethylene glycol (MIRALAX / GLYCOLAX) packet 17 g, 17 g, Oral, Daily PRN, Jonathan Fail, MD   sertraline (ZOLOFT) tablet 25 mg, 25 mg, Oral, Daily, Jonathan Fail, MD, 25 mg at 10/01/23 0916   sodium chloride flush (NS) 0.9 % injection 3 mL, 3 mL, Intravenous, Q12H, Jonathan Fail, MD, 3 mL at 10/01/23 2311 Labs and Diagnostic Imaging   CBC:  Recent Labs  Lab 09/30/23 1553 09/30/23 1618 10/01/23 0529  WBC 10.4  --  6.4  NEUTROABS 4.7  --   --   HGB 15.4 15.0 14.3  HCT 44.7 44.0 41.9  MCV 90.3  --  89.5  PLT 354  --  323    Basic Metabolic Panel:  Lab Results  Component Value Date   NA 133 (L) 10/01/2023   K 3.6 10/01/2023   CO2 27 10/01/2023   GLUCOSE 85 10/01/2023   BUN 11 10/01/2023   CREATININE 0.79 10/01/2023   CALCIUM 9.0 10/01/2023   GFRNONAA >60 10/01/2023   GFRAA 94 08/16/2008   Lipid Panel:  Lab Results  Component  Value Date   LDLCALC 104 (H) 01/26/2023   HgbA1c:  Lab Results  Component Value Date   HGBA1C 5.3 09/22/2023   Urine Drug Screen:     Component Value Date/Time   LABOPIA NEG 09/29/2012 0830   COCAINSCRNUR Negative 08/18/2023 1114   COCAINSCRNUR NEG 09/29/2012 0830   LABBENZ POS (A) 09/29/2012 0830   AMPHETMU NEG 09/29/2012 0830    Alcohol Level No results found for: "ETH" INR  Lab Results  Component Value Date   INR 1.0 08/29/2023   APTT  Lab Results  Component Value Date   APTT 32 08/29/2023   MRI Cervical, thoracic and lumbar spine(Personally reviewed): Some minor disc degenerative changes but no significant spinal cord compression, most prominent at L4-L5 with b/l foraminal narrowing and possible enhancement of nerve roots.   MRI Brain(Personally reviewed): No acute abnormality   Assessment   HOGAN FUNARO is a 57 y.o. male with PMHx significant for Anxiety, Arthritis, Cataract, Depression,  Detached retina, Heart murmur, History of bilateral cataract extraction, HLD, Hypothyroidism, Low back pain, and panic attacks who presents from his outpatient neurology office 12/18 for IVIG for ongoing CIDP with approximately 2 months of ascending weakness and numbness in his arms and legs bilaterally and leg weakness affecting his gait significantly.  Patient states that when he attempts to ambulate, his legs constantly buckle and he reports burning and tingling of bilateral feet with numbness in bilateral palms.. Patient also complains of wrist weakness, arm weakness and incoordination.   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  MRI brain negative.   Recommendations   - Continue IVIG Q24H for a total of 5 doses (end date 12/22) - continue NIF/VC Q6H - PT/OT  - We did reach out to Dr. Terrace Arabia via chat and telephone, did not hear back. With the weekend and holiday week next week, and as  patient has an appointment for an outpatient LP 12/31, we recommend that he keep this appointment and have the LP done as planned to avoid any missing lab orders needed and needing to have the procedure done twice. Will discuss this with patient as well.   _______________________________________________________________ Pt seen by Neuro NP/APP and later by MD. Note/plan to be edited by MD as needed.    Jonathan January, DNP, AGACNP-BC [facility] Please use AMION for contact information & EPIC for messaging.   Attending Neurohospitalist Addendum Patient seen and examined with APP/Resident. Agree with the history and physical as documented above. Agree with the plan as documented, which I helped formulate. I have edited the note above to reflect my full findings and recommendations. I have independently reviewed the chart, obtained history, review of systems and examined the patient.I have personally reviewed pertinent head/neck/spine imaging (CT/MRI). Please feel free to call with any questions.  -- Jonathan Neighbors, MD [facility] [phone]  If 7pm- 7am, please page neurology on call as listed in AMION.

## 2023-10-02 NOTE — Consult Note (Signed)
Physical Medicine and Rehabilitation Consult Reason for Consult: Evaluate appropriateness for Inpatient Rehab Referring Physician: Dr. David Stall    HPI: Jonathan Luna is a 57 y.o. male with PMHx of  has a past medical history of Allergy, Anxiety, Arthritis, Cataract, Community acquired pneumonia (08/28/2023), Depression, Detached retina, Heart murmur, History of bilateral cataract extraction, Hyperlipidemia, Hypothyroidism, Low back pain, Panic attack, and Panic attacks. . 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  Patient had already had MRI L-spine showing degenerative changes most prominent at L4-5 prior.  On 12-19, patient developed left NLF and mouth asymmetry, neurology concern for possible blood clotting and stroke secondary to IVIG, however MRI brain was performed with no acute findings.  Patient is being maintained on intermittent oxygen with last recorded and if -40 VC 2.5 L, although he did refuse his last NIF.  PM&R was consulted to evaluate appropriateness for IPR admission.   Per chart review, patient lives in his private residence with his spouse with a single-story layout, one-step to enter.  He is independent with a rolling walker at baseline and modified independent for ADLs, wife assists with some IADLs.  Notable, prior to his initial illness, he was independent without any ambulatory devices.   Review of Systems  Constitutional:  Negative for chills and fever.  Respiratory:  Negative for cough, hemoptysis and shortness of breath.   Cardiovascular:  Negative for chest pain and palpitations.  Gastrointestinal:  Negative for constipation,  diarrhea, nausea and vomiting.  Genitourinary:  Negative for dysuria and urgency.  Musculoskeletal:  Positive for myalgias.  Neurological:  Positive for tingling, tremors, sensory change and focal weakness. Negative for dizziness, loss of consciousness and headaches.  Psychiatric/Behavioral:  Negative for depression. The patient is not nervous/anxious and does not have insomnia.    Past Medical History:  Diagnosis Date   Allergy    Anxiety    Arthritis    Cataract    Community acquired pneumonia 08/28/2023   Depression    Detached retina    Heart murmur    History of bilateral cataract extraction    Hyperlipidemia    Hypothyroidism    Low back pain    Panic attack    Panic attacks    Past Surgical History:  Procedure Laterality Date   CATARACT EXTRACTION     EYE SURGERY     Family History  Problem Relation Age of Onset   Breast cancer Mother    Hypertension Father    Heart disease Father    Asthma Father    Emphysema Father        smoker for 45 years   Diabetes Brother    Hyperlipidemia Brother    Social History:  reports that he has never smoked. He has been exposed to tobacco smoke. He has never used smokeless tobacco. He reports that he does not currently use alcohol. He reports that he does not use drugs. Allergies:  Allergies  Allergen Reactions   Amoxicillin Other (See Comments)    Pt states that it does not work for him when he had back to back pneumonia   Effexor [Venlafaxine] Diarrhea   Fluoxetine Hcl    Prozac [Fluoxetine Hcl] Diarrhea  Medications Prior to Admission  Medication Sig Dispense Refill   aspirin EC 81 MG tablet Take 81 mg by mouth daily. Swallow whole.     Cholecalciferol (VITAMIN D-3) 25 MCG (1000 UT) CAPS Take 1 capsule by mouth daily.     clonazePAM (KLONOPIN) 0.5 MG tablet Take 1 tablet (0.5 mg total) by mouth 2 (two) times daily as needed for anxiety. 60 tablet 1   fluticasone (FLONASE) 50 MCG/ACT nasal spray Place 2 sprays into both  nostrils daily. 48 g 1   gabapentin (NEURONTIN) 300 MG capsule Take 2 capsules (600 mg total) by mouth 3 (three) times daily. 180 capsule 11   indapamide (LOZOL) 1.25 MG tablet Take 1 tablet (1.25 mg total) by mouth daily. (Patient taking differently: Take 1.25 mg by mouth in the morning. Take with Irbesartan) 90 tablet 0   irbesartan (AVAPRO) 300 MG tablet Take 1 tablet (300 mg total) by mouth daily. (Patient taking differently: Take 300 mg by mouth in the morning. Take with Indapamide) 90 tablet 0   oxyCODONE-acetaminophen (PERCOCET/ROXICET) 5-325 MG tablet Take 1 tablet by mouth every 8 (eight) hours as needed for severe pain (pain score 7-10). 90 tablet 0   sertraline (ZOLOFT) 25 MG tablet Take 1 tablet (25 mg total) by mouth daily. 30 tablet 0   UNITHROID 125 MCG tablet Take 1 tablet (125 mcg total) by mouth daily before breakfast. (Patient taking differently: Take 125 mcg by mouth at bedtime.) 90 tablet 0   metoprolol succinate (TOPROL XL) 25 MG 24 hr tablet Take 1 tablet (25 mg total) by mouth daily. (Patient not taking: Reported on 09/30/2023) 90 tablet 0    Home: Home Living Family/patient expects to be discharged to:: Private residence Living Arrangements: Spouse/significant other Available Help at Discharge: Family Type of Home: House Home Access: Stairs to enter Secretary/administrator of Steps: 1 Home Layout: One level Bathroom Shower/Tub: Engineer, manufacturing systems: Standard Home Equipment: Grab bars - tub/shower, Rollator (4 wheels), Wheelchair - manual  Functional History: Prior Function Prior Level of Function : Independent/Modified Independent, History of Falls (last six months) Mobility Comments: ambulating with RW.5 falls in the last 3 weeks (without the RW); no fall since RW use ADLs Comments: mod I, wife assists with IADLs Functional Status:  Mobility: Bed Mobility Overal bed mobility: Needs Assistance Bed Mobility: Supine to Sit, Sit to Supine Supine to sit:  Supervision Sit to supine: Supervision General bed mobility comments: OOB in the chair on arrival Transfers Overall transfer level: Needs assistance Transfers: Sit to/from Stand Sit to Stand: Contact guard assist General transfer comment: cues for safety/hand placement Ambulation/Gait Ambulation/Gait assistance: Min assist, Mod assist (mod as he fatigue and began to be tremulous.) Gait Distance (Feet): 150 Feet (100 additional feet) Assistive device: Rolling walker (2 wheels) Gait Pattern/deviations: Step-through pattern General Gait Details: mildly unsteady, progressing to tremulous and less steady.  R steppage gait with foot slap.  Cues for posture and proximity to the RW--improved stability immediately until fatigue. Gait velocity interpretation: <1.8 ft/sec, indicate of risk for recurrent falls    ADL: ADL Overall ADL's : Needs assistance/impaired Eating/Feeding: Independent Grooming: Contact guard assist, Standing Upper Body Bathing: Set up, Sitting Lower Body Bathing: Contact guard assist, Sit to/from stand Upper Body Dressing : Set up, Sitting Lower Body Dressing: Sit to/from stand, Minimal assistance Toilet Transfer: Contact guard assist, Ambulation, Rolling walker (2 wheels) Toileting- Clothing Manipulation and Hygiene: Contact guard assist, Sit to/from stand Functional mobility during ADLs: Contact guard assist, Rolling  walker (2 wheels) General ADL Comments: CGA for safety, mild LOBs noted  Cognition: Cognition Overall Cognitive Status: No family/caregiver present to determine baseline cognitive functioning Orientation Level: Oriented X4 Cognition Arousal: Alert Behavior During Therapy: WFL for tasks assessed/performed Overall Cognitive Status: No family/caregiver present to determine baseline cognitive functioning General Comments: basic orientation WFL limited insight and attention noted. would benefit from higher level assessment  Blood pressure (!) 155/81,  pulse 92, temperature 97.7 F (36.5 C), temperature source Oral, resp. rate 20, height 5\' 7"  (1.702 m), weight 83.1 kg, SpO2 95%. Physical Exam  Constitutional: No apparent distress. Appropriate appearance for age.  HENT: No JVD. Neck Supple. Trachea midline.  Mild left facial droop. Eyes: Left lens subluxation.  Right exotropia.  Difficulty with fixation and convergence, EOMI grossly intact.  Center visual field loss in right eye. Cardiovascular: RRR, no murmurs/rub/gallops. No Edema. Peripheral pulses 2+  Respiratory: CTAB. No rales, rhonchi, or wheezing. On RA.  Can carry on normal conversation without excessive shortness of breath. Abdomen: + bowel sounds, normoactive. No distention or tenderness.  Skin: C/D/I. No apparent lesions.  Peripheral IVs intact. MSK:      No apparent deformity.       Neurologic exam:  Cognition: AAO to person, place, time and event.  Language: Fluent, No substitutions or neoglisms. No dysarthria. Names 3/3 objects correctly.  Memory: Recalls 3/3 objects at 5 minutes. No apparent deficits  Insight: Good  insight into current condition.  Mood: Pleasant affect, appropriate mood.  Sensation: To light touch reduced on the plantar surfaces of bilateral feet Reflexes: Flaccid throughout bilateral lower extremities, 1+ biceps. Negative Hoffman's and babinski signs bilaterally.  CN: Mild left facial droop as above Coordination: Bilateral upper and lower extremity intention tremors Spasticity: MAS 0 in all extremities.  Strength:                RUE: 5/5 SA, 5/5 EF, 5/5 EE, 4/5 WE, 3/5 FA, 4/5 FF                LUE:  5/5 SA, 5/5 EF, 5/5 EE, 4/5 WE, 3/5 FA, 4/5 FF                RLE: 5/5 HF, 4/5 KE, 4/5  DF, 1/5  EHL, 4/5  PF                 LLE:  5/5 HF, 5/5 KE, 4/5  DF, 1/5  EHL, 4/5  PF      No results found for this or any previous visit (from the past 24 hours). MR BRAIN WO CONTRAST Result Date: 10/01/2023 CLINICAL DATA:  Sepsis EXAM: MRI HEAD WITHOUT  CONTRAST TECHNIQUE: Multiplanar, multiecho pulse sequences of the brain and surrounding structures were obtained without intravenous contrast. COMPARISON:  09/23/2023 FINDINGS: Brain: No acute infarct, mass effect or extra-axial collection. No acute or chronic hemorrhage. Normal white matter signal, parenchymal volume and CSF spaces. The midline structures are normal. Vascular: Normal flow voids. Skull and upper cervical spine: Normal calvarium and skull base. Visualized upper cervical spine and soft tissues are normal. Sinuses/Orbits:No paranasal sinus fluid levels or advanced mucosal thickening. No mastoid or middle ear effusion. Normal orbits. IMPRESSION: Normal brain MRI. Electronically Signed   By: Deatra Robinson M.D.   On: 10/01/2023 19:42   CT CHEST WO CONTRAST Result Date: 09/30/2023 CLINICAL DATA:  Respiratory illness abnormal chest x-ray EXAM: CT CHEST WITHOUT CONTRAST TECHNIQUE: Multidetector CT imaging of the chest was performed following the  standard protocol without IV contrast. RADIATION DOSE REDUCTION: This exam was performed according to the departmental dose-optimization program which includes automated exposure control, adjustment of the mA and/or kV according to patient size and/or use of iterative reconstruction technique. COMPARISON:  09/30/2023, CT chest 08/28/2023 FINDINGS: Cardiovascular: Limited assessment without intravenous contrast. Nonaneurysmal aorta. Normal cardiac size. Mild coronary vascular calcification. Trace pericardial effusion Mediastinum/Nodes: Patent trachea. No thyroid mass. No suspicious lymph nodes. Esophagus within normal limits. Lungs/Pleura: Emphysema. No pleural effusion or pneumothorax. Residual interstitial and minimal clustered nodularity in the posterior right upper lobe at the site of prior consolidation though improved significantly compared to the chest CT from November. Minimal bronchiectasis also in the region and some bandlike densities suggesting  developing post infectious or inflammatory scarring. Upper Abdomen: No acute finding Musculoskeletal: No acute or suspicious osseous abnormality IMPRESSION: 1. Residual interstitial and minimal clustered nodularity in the posterior right upper lobe at the site of prior consolidation though improved significantly compared to the chest CT from November. Minimal bronchiectasis also in the region and some bandlike densities suggesting developing post infectious or inflammatory scarring. Recommend a follow-up chest CT in 3 months to ensure further resolution or document stability. 2. Emphysema. Aortic Atherosclerosis (ICD10-I70.0) and Emphysema (ICD10-J43.9). Electronically Signed   By: Jasmine Pang M.D.   On: 09/30/2023 20:22   DG Chest Portable 1 View Result Date: 09/30/2023 CLINICAL DATA:  Dyspnea on exertion EXAM: PORTABLE CHEST 1 VIEW COMPARISON:  Chest radiograph dated 10/29/2022. CTA chest dated 08/28/2023. FINDINGS: Persistent right upper lobe opacity, possibly improving infection/pneumonia, but underlying lesion/mass is not excluded. Consider CT chest for further evaluation. Left lung is clear.  No pleural effusion or pneumothorax. The heart is normal in size. IMPRESSION: Persistent right upper lobe opacity, possibly improving infection/pneumonia, but underlying lesion/mass is not excluded. Consider CT chest for further evaluation. Electronically Signed   By: Charline Bills M.D.   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 Yes Does the patient require coordinated care of a physician, rehab nurse, therapy disciplines of PT, OT to address physical and functional deficits in the context of the above medical diagnosis(es)? Yes Addressing deficits in the following areas: balance, endurance, locomotion, strength, transferring, bathing, dressing, feeding, grooming, and toileting Can the patient actively participate in an intensive therapy program of at least 3 hrs of therapy per day at least 5 days per week? Yes The potential for patient to make measurable gains while on inpatient rehab is good Anticipated functional outcomes upon discharge from inpatient rehab are modified independent  with PT, modified independent with OT Estimated rehab length of stay to reach the above functional goals is: 10-14 days Anticipated discharge destination: Home Overall Rehab/Functional Prognosis: good  POST ACUTE RECOMMENDATIONS: This patient's condition is appropriate for continued rehabilitative care in the following setting: CIR Patient has agreed to participate in recommended program. Yes Note that insurance prior authorization may be required for reimbursement for recommended care.  Comment: Mr.  Awad is an excellent candidate for inpatient rehab.  He presents to the hospital with diagnosis of acute on chronic inflammatory demyelinating neuropathy, and has been making excellent functional gains since initiation of IVIG.  He remains with some medical complications of intermittent respiratory difficulty and neuropathic pain, complicated by chronic low back pain, as well as hypertension and hyponatremia.  He has an excellent dispo plan with a single story home, and wife who can provide support.  He is motivated  to participate in inpatient rehab, and is anticipated to make good gains with an intensive therapy program.  MEDICAL RECOMMENDATIONS: Consider low-dose gabapentin for neuropathic pains Consider baclofen as needed for ongoing spasms in hands and feet   I have personally performed a face to face diagnostic evaluation of this patient. Additionally, I have examined the patient's medical record including any pertinent labs and radiographic images. If the physician assistant has documented in this note, I have reviewed and edited or otherwise concur with the physician assistant's documentation.  Thanks,  [name], DO 10/02/2023

## 2023-10-02 NOTE — Progress Notes (Signed)
Pt achieved a NIF of - 40 and VC of 2.75 with great pt effort on all attempts, RT will monitor as need.

## 2023-10-02 NOTE — Progress Notes (Signed)
[facility] PROGRESS NOTE    Progress Note  Jonathan Luna  ZOX:[id number] DOB: January 24, 1966 DOA: 09/30/2023 PCP: [name], MD     Brief Narrative:   Jonathan Luna is an 57 y.o. male past medical history of hypertension hypothyroidism neuropathy and gait abnormality, pneumonia treated with antibiotics in October, then again in November was admitted for sepsis and pneumonia and treated with antibiotics, was seen in the neurologist office for worsening gait abnormality and ascending paralysis started 1 month prior to admission presently getting worst the neurologist was concerned about AIDP [name] variant sent to the ED for admission and IVIG.   Assessment/Plan:   CIDP (chronic inflammatory demyelinating polyneuropathy) (HCC) MRI of the L-spine showed degenerative disease most prominent L4-L5. Appreciate neurology's assistance continue IVIG for total of 5 days. Continue negative inspiratory force and vital capacity every 12 hours. PT OT evaluated the patient will need inpatient rehab.  Essential hypertension: Hold antihypertensive medication, except for metoprolol. Blood pressure seems to be stable.  Hypothyroidism Continue Synthroid.  Chronic pain: Continue oxycodone.  Anxiety: Continue sertraline.   DVT prophylaxis: lovenox Family Communication:none Status is: Inpatient Remains inpatient appropriate because: Acute on chronic inflammatory demyelinating polyneuropathy    Code Status:     Code Status Orders  (From admission, onward)           Start     Ordered   09/30/23 1733  Full code  Continuous       Question:  By:  Answer:  Consent: discussion documented in EHR   09/30/23 1745           Code Status History     Date Active Date Inactive Code Status Order ID Comments User Context   08/28/2023 1940 08/30/2023 2205 Full Code [id number]  Nolberto Hanlon, MD ED         IV Access:   Peripheral IV   Procedures and diagnostic studies:    MR BRAIN WO CONTRAST Result Date: 10/01/2023 CLINICAL DATA:  Sepsis EXAM: MRI HEAD WITHOUT CONTRAST TECHNIQUE: Multiplanar, multiecho pulse sequences of the brain and surrounding structures were obtained without intravenous contrast. COMPARISON:  09/23/2023 FINDINGS: Brain: No acute infarct, mass effect or extra-axial collection. No acute or chronic hemorrhage. Normal white matter signal, parenchymal volume and CSF spaces. The midline structures are normal. Vascular: Normal flow voids. Skull and upper cervical spine: Normal calvarium and skull base. Visualized upper cervical spine and soft tissues are normal. Sinuses/Orbits:No paranasal sinus fluid levels or advanced mucosal thickening. No mastoid or middle ear effusion. Normal orbits. IMPRESSION: Normal brain MRI. Electronically Signed   By: Deatra Robinson M.D.   On: 10/01/2023 19:42   CT CHEST WO CONTRAST Result Date: 09/30/2023 CLINICAL DATA:  Respiratory illness abnormal chest x-ray EXAM: CT CHEST WITHOUT CONTRAST TECHNIQUE: Multidetector CT imaging of the chest was performed following the standard protocol without IV contrast. RADIATION DOSE REDUCTION: This exam was performed according to the departmental dose-optimization program which includes automated exposure control, adjustment of the mA and/or kV according to patient size and/or use of iterative reconstruction technique. COMPARISON:  09/30/2023, CT chest 08/28/2023 FINDINGS: Cardiovascular: Limited assessment without intravenous contrast. Nonaneurysmal aorta. Normal cardiac size. Mild coronary vascular calcification. Trace pericardial effusion Mediastinum/Nodes: Patent trachea. No thyroid mass. No suspicious lymph nodes. Esophagus within normal limits. Lungs/Pleura: Emphysema. No pleural effusion or pneumothorax. Residual interstitial and minimal clustered nodularity in the posterior right upper lobe at the site of prior consolidation though improved significantly compared to the chest CT  from  November. Minimal bronchiectasis also in the region and some bandlike densities suggesting developing post infectious or inflammatory scarring. Upper Abdomen: No acute finding Musculoskeletal: No acute or suspicious osseous abnormality IMPRESSION: 1. Residual interstitial and minimal clustered nodularity in the posterior right upper lobe at the site of prior consolidation though improved significantly compared to the chest CT from November. Minimal bronchiectasis also in the region and some bandlike densities suggesting developing post infectious or inflammatory scarring. Recommend a follow-up chest CT in 3 months to ensure further resolution or document stability. 2. Emphysema. Aortic Atherosclerosis (ICD10-I70.0) and Emphysema (ICD10-J43.9). Electronically Signed   By: Jasmine Pang M.D.   On: 09/30/2023 20:22   DG Chest Portable 1 View Result Date: 09/30/2023 CLINICAL DATA:  Dyspnea on exertion EXAM: PORTABLE CHEST 1 VIEW COMPARISON:  Chest radiograph dated 10/29/2022. CTA chest dated 08/28/2023. FINDINGS: Persistent right upper lobe opacity, possibly improving infection/pneumonia, but underlying lesion/mass is not excluded. Consider CT chest for further evaluation. Left lung is clear.  No pleural effusion or pneumothorax. The heart is normal in size. IMPRESSION: Persistent right upper lobe opacity, possibly improving infection/pneumonia, but underlying lesion/mass is not excluded. Consider CT chest for further evaluation. Electronically Signed   By: Charline Bills M.D.   On: 09/30/2023 16:33   NCV with EMG(electromyography) Result Date: 09/30/2023 Levert Feinstein, MD     09/30/2023  6:03 PM     Full Name: Temitayo Parkhouse Gender: Male MRN #: [id number] Date of Birth: Jan 03, 1966   Visit Date: 09/30/2023 09:28 Age: 67 Years Examining Physician: Dr. Levert Feinstein Referring Physician: Dr. Levert Feinstein Height: 5 feet 7 inch History: 57 year old male presenting with subacute onset of gait abnormality in October 2024 Summary of  the test: Nerve conduction study: Bilateral sural, superficial peroneal sensory responses were within normal limit, with upper limit of peak latency. Left radial sensory responses were normal.  Left ulnar, medial sensory responses showed mildly decreased snap amplitude. Bilateral tibial motor responses were normal at distal stimulation site, with evidence of temporal dispersion at proximal stimulation site.  Bilateral tibial F-wave latency was prolonged. Bilateral peroneal to EDB motor response showed significantly decreased CMAP amplitude. Left ulnar, median motor responses showed evidence of decreased CMAP amplitude, evidence of temporal dispersion at proximal stimulation site, with upper limit of left ulnar F-wave latency. Electromyography: Selected needle examinations were performed at left lower extremity muscles, lumbosacral paraspinal muscles;, left upper extremity muscles and left cervical paraspinal muscles. There is no evidence of active denervation, but there is evidence of decreased recruitment patterns at all the muscle tested. There is noticeable polyphasic changes at left cervical and lumbar paraspinal muscles. Conclusion: This is an abnormal study.  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  There is no evidence of axonal loss. Levert Feinstein. M.D. Ph.D. [facility] [address], [address] [place], [place] [address] Tel: [phone] Fax: [phone] Verbal informed consent was obtained from the patient, patient was informed of potential risk of procedure, including bruising, bleeding, hematoma formation, infection, muscle weakness, muscle pain, numbness, among others.     MNC   Nerve / Sites Muscle Latency Ref. Amplitude Ref. Rel Amp Segments Distance Velocity Ref. Area   ms ms mV mV %  cm m/s m/s mVms L Median - APB    Wrist APB 4.0 <=4.4 3.2 >=4.0 100 Wrist - APB 7   8.8    Upper arm APB 8.3  1.7  52 Upper arm - Wrist 20 46 >=49 5.5 L Ulnar -  ADM     Wrist ADM 3.0 <=3.3 3.8 >=6.0 100 Wrist - ADM 7   15.1    B.Elbow ADM 4.6  1.2  31 B.Elbow - Wrist 11 69 >=49 3.4    A.Elbow ADM 8.3  2.9  247 A.Elbow - B.Elbow 19 52 >=49 13.3 L Peroneal - EDB    Ankle EDB 7.0 <=6.5 0.9 >=2.0 100 Ankle - EDB 9   3.0    Fib head EDB 14.4  0.5  63.7 Fib head - Ankle 24 32 >=44 1.7    Pop fossa EDB 17.1  0.6  112 Pop fossa - Fib head 12 45 >=44 1.5        Pop fossa - Ankle     R Peroneal - EDB    Ankle EDB 7.5 <=6.5 0.8 >=2.0 100 Ankle - EDB 9   2.2    Fib head EDB 13.3  0.5  64.9 Fib head - Ankle 23.6 40 >=44 2.5    Pop fossa EDB 15.1  0.7  152 Pop fossa - Fib head 10.6 60 >=44 3.4        Pop fossa - Ankle     L Tibial - AH    Ankle AH 4.9 <=5.8 4.1 >=4.0 100 Ankle - AH 9   14.8    Pop fossa AH 13.6  0.8  20.8 Pop fossa - Ankle 37 42 >=41 3.7 R Tibial - AH    Ankle AH 5.2 <=5.8 4.6 >=4.0 100 Ankle - AH 9   21.8    Pop fossa AH 15.7  1.4  30.1 Pop fossa - Ankle 37 35 >=41 4.8               SNC   Nerve / Sites Rec. Site Peak Lat Ref.  Amp Ref. Segments Distance   ms ms V V  cm L Radial - Anatomical snuff box (Forearm)    Forearm Wrist 2.3 <=2.9 16 >=15 Forearm - Wrist 10 L Sural - Ankle (Calf)    Calf Ankle 4.2 <=4.4 8 >=6 Calf - Ankle 14 R Sural - Ankle (Calf)    Calf Ankle 4.4 <=4.4 6 >=6 Calf - Ankle 14 L Superficial peroneal - Ankle    Lat leg Ankle 4.2 <=4.4 10 >=6 Lat leg - Ankle 14 R Superficial peroneal - Ankle    Lat leg Ankle 4.5 <=4.4 4 >=6 Lat leg - Ankle 14 L Median - Orthodromic (Dig II, Mid palm)    Dig II Wrist 3.1 <=3.4 4 >=10 Dig II - Wrist 13 L Ulnar - Orthodromic, (Dig V, Mid palm)    Dig V Wrist 2.8 <=3.1 3 >=5 Dig V - Wrist 5                 F  Wave   Nerve F Lat Ref.  ms ms L Tibial - AH 60.5 <=56.0 R Tibial - AH 61.0 <=56.0 L Ulnar - ADM 31.3 <=32.0 L Median - APB 31.9 <=31.0           EMG Summary Table   Spontaneous MUAP Recruitment Muscle IA Fib PSW Fasc Other Amp Dur. Poly Pattern L. Tibialis anterior Increased None None None _______ Increased Increased  1+ Reduced L. Peroneus longus Increased None None None _______ Increased Increased 1+ Reduced L. Gastrocnemius (Medial head) Increased None None None _______ Increased Increased 1+ Reduced L. Vastus lateralis Increased None None None _______ Increased Increased 1+ Reduced L. First dorsal interosseous Increased None None  None _______ Increased Increased 1+ Reduced L. Extensor digitorum communis Increased None None None _______ Increased Increased 1+ Reduced L. Biceps brachii Increased None None None _______ Increased Increased 1+ Reduced L. Deltoid Increased None None None _______ Increased Increased 1+ Reduced L. Pronator teres Increased None None None _______ Increased Increased 1+ Reduced L. Cervical paraspinals Normal None None None _______ Normal Normal Normal Normal L. Lumbar paraspinals (low) Normal None None None _______ Increased Increased 1+ Normal L. Lumbar paraspinals (mid) Increased None None None _______ Increased Increased 2+ Normal      Medical Consultants:   None.   Subjective:    RASHON PLOURD relates his tiredness is improving.  Objective:    Vitals:   10/01/23 1800 10/01/23 1846 10/01/23 2241 10/02/23 0319  BP: 130/82 134/66 (!) 106/90 113/67  Pulse:   (!) 102 75  Resp:   15 18  Temp:   98.5 F (36.9 C) 98.3 F (36.8 C)  TempSrc:   Oral Oral  SpO2:   96% 97%  Weight:      Height:       SpO2: 97 %  No intake or output data in the 24 hours ending 10/02/23 0731  Filed Weights   09/30/23 1357 10/01/23 0410  Weight: 83.9 kg 83.1 kg    Exam: General exam: In no acute distress. Respiratory system: Good air movement and clear to auscultation. Cardiovascular system: S1 & S2 heard, RRR. No JVD. Gastrointestinal system: Abdomen is nondistended, soft and nontender.  Extremities: No pedal edema. Skin: No rashes, lesions or ulcers Psychiatry: Judgement and insight appear normal. Mood & affect appropriate.  Data Reviewed:    Labs: Basic Metabolic Panel: Recent  Labs  Lab 09/30/23 1553 09/30/23 1618 10/01/23 0529  NA 132* 128* 133*  K 4.4 4.2 3.6  CL 97*  --  99  CO2 21*  --  27  GLUCOSE 81  --  85  BUN 8  --  11  CREATININE 0.78  --  0.79  CALCIUM 9.7  --  9.0   GFR Estimated Creatinine Clearance: 105 mL/min (by C-G formula based on SCr of 0.79 mg/dL). Liver Function Tests: Recent Labs  Lab 10/01/23 0529  AST 21  ALT 18  ALKPHOS 54  BILITOT 0.8  PROT 7.3  ALBUMIN 3.6   No results for input(s): "LIPASE", "AMYLASE" in the last 168 hours. No results for input(s): "AMMONIA" in the last 168 hours. Coagulation profile No results for input(s): "INR", "PROTIME" in the last 168 hours. COVID-19 Labs  No results for input(s): "DDIMER", "FERRITIN", "LDH", "CRP" in the last 72 hours.  Lab Results  Component Value Date   SARSCOV2NAA NEGATIVE 09/30/2023   SARSCOV2NAA NEGATIVE 08/28/2023   SARSCOV2NAA Not Detected 02/27/2022    CBC: Recent Labs  Lab 09/30/23 1553 09/30/23 1618 10/01/23 0529  WBC 10.4  --  6.4  NEUTROABS 4.7  --   --   HGB 15.4 15.0 14.3  HCT 44.7 44.0 41.9  MCV 90.3  --  89.5  PLT 354  --  323   Cardiac Enzymes: No results for input(s): "CKTOTAL", "CKMB", "CKMBINDEX", "TROPONINI" in the last 168 hours. BNP (last 3 results) Recent Labs    09/04/23 1021  PROBNP 6.0   CBG: No results for input(s): "GLUCAP" in the last 168 hours. D-Dimer: No results for input(s): "DDIMER" in the last 72 hours. Hgb A1c: No results for input(s): "HGBA1C" in the last 72 hours. Lipid Profile: No results for input(s): "CHOL", "HDL", "LDLCALC", "  TRIG", "CHOLHDL", "LDLDIRECT" in the last 72 hours. Thyroid function studies: No results for input(s): "TSH", "T4TOTAL", "T3FREE", "THYROIDAB" in the last 72 hours.  Invalid input(s): "FREET3" Anemia work up: No results for input(s): "VITAMINB12", "FOLATE", "FERRITIN", "TIBC", "IRON", "RETICCTPCT" in the last 72 hours. Sepsis Labs: Recent Labs  Lab 09/30/23 1552 09/30/23 1553  10/01/23 0529  WBC  --  10.4 6.4  LATICACIDVEN 1.7  --   --    Microbiology Recent Results (from the past 240 hours)  Resp panel by RT-PCR (RSV, Flu A&B, Covid) Anterior Nasal Swab     Status: None   Collection Time: 09/30/23  4:40 PM   Specimen: Anterior Nasal Swab  Result Value Ref Range Status   SARS Coronavirus 2 by RT PCR NEGATIVE NEGATIVE Final   Influenza A by PCR NEGATIVE NEGATIVE Final   Influenza B by PCR NEGATIVE NEGATIVE Final    Comment: (NOTE) The Xpert Xpress SARS-CoV-2/FLU/RSV plus assay is intended as an aid in the diagnosis of influenza from Nasopharyngeal swab specimens and should not be used as a sole basis for treatment. Nasal washings and aspirates are unacceptable for Xpert Xpress SARS-CoV-2/FLU/RSV testing.  Fact Sheet for Patients: BloggerCourse.com  Fact Sheet for Healthcare Providers: SeriousBroker.it  This test is not yet approved or cleared by the [place] FDA and has been authorized for detection and/or diagnosis of SARS-CoV-2 by FDA under an Emergency Use Authorization (EUA). This EUA will remain in effect (meaning this test can be used) for the duration of the COVID-19 declaration under Section 564(b)(1) of the Act, 21 U.S.C. section 360bbb-3(b)(1), unless the authorization is terminated or revoked.     Resp Syncytial Virus by PCR NEGATIVE NEGATIVE Final    Comment: (NOTE) Fact Sheet for Patients: BloggerCourse.com  Fact Sheet for Healthcare Providers: SeriousBroker.it  This test is not yet approved or cleared by the [place] FDA and has been authorized for detection and/or diagnosis of SARS-CoV-2 by FDA under an Emergency Use Authorization (EUA). This EUA will remain in effect (meaning this test can be used) for the duration of the COVID-19 declaration under Section 564(b)(1) of the Act, 21 U.S.C. section 360bbb-3(b)(1), unless  the authorization is terminated or revoked.  Performed at [facility], [address], [place], [place] [address]      Medications:    acetaminophen  1,000 mg Oral Once   enoxaparin (LOVENOX) injection  40 mg Subcutaneous Q24H   levothyroxine  125 mcg Oral Q0600   sertraline  25 mg Oral Daily   sodium chloride flush  3 mL Intravenous Q12H   Continuous Infusions:  Immune Globulin 10% 201 mL/hr at 09/30/23 1724      LOS: 2 days   Marinda Elk  [facility]  10/02/2023, 7:31 AM

## 2023-10-02 NOTE — TOC Initial Note (Signed)
Transition of Care (TOC) - Initial/Assessment Note  Donn Pierini RN,BSN Transitions of Care Unit 4NP (Non Trauma)- RN Case Manager See Treatment Team for direct Phone #   Patient Details  Name: Jonathan Luna MRN: [id number] Date of Birth: 08/28/1966  Transition of Care [facility]) CM/SW Contact:    Darrold Span, RN Phone Number: 10/02/2023, 11:42 AM  Clinical Narrative:                 Pt admitted with AIDP- neuro following - note plan for IVIG for 5 doses- end date 12/22. From home w/ wife  CIR to follow for potential admit- await consult.   TOC to follow for transition needs/plan    Expected Discharge Plan: IP Rehab Facility Barriers to Discharge: Continued Medical Work up   Patient Goals and CMS Choice Patient states their goals for this hospitalization and ongoing recovery are:: return home and recover, get stronger          Expected Discharge Plan and Services   Discharge Planning Services: CM Consult Post Acute Care Choice: IP Rehab Living arrangements for the past 2 months: Single Family Home                                      Prior Living Arrangements/Services Living arrangements for the past 2 months: Single Family Home Lives with:: Spouse              Current home services: DME    Activities of Daily Living      Permission Sought/Granted                  Emotional Assessment              Admission diagnosis:  Gait abnormality [R26.9] Areflexia [R29.2] Truncal ataxia [R27.8] Weakness of both lower extremities [R29.898] Weakness of both upper extremities [R29.898] Patient Active Problem List   Diagnosis Date Noted   AIDP (acute inflammatory demyelinating polyneuropathy) (HCC) 09/30/2023   Paresthesia 09/22/2023   Gait abnormality 09/22/2023   Tremor 09/04/2023   Weakness 09/04/2023   DOE (dyspnea on exertion) 09/04/2023   Peripheral neuropathy 08/28/2023   Tachycardia 08/18/2023   Pneumonia of right upper lobe  due to infectious organism 08/18/2023   Hypogonadism male 10/24/2021   Vitamin D deficiency disease 09/08/2018   Essential hypertension 09/07/2018   Long-term current use of opiate analgesic 04/07/2018   Encounter for long-term opiate analgesic use 08/03/2017   Colon cancer screening 12/10/2016   Pure hyperglyceridemia 09/11/2016   Low back pain 05/08/2016   Allergic rhinitis 12/13/2012   Chronic pain disorder 09/29/2012   Routine general medical examination at a health care facility 10/08/2011   Irritable bowel syndrome 10/16/2010   DDD (degenerative disc disease), lumbar 04/09/2010   Hypothyroidism 05/03/2009   Hyperlipidemia with target LDL less than 130 05/03/2009   Panic anxiety syndrome 05/03/2009   PCP:  [name], MD Pharmacy:   [facility] [address] [place] - [address] [place] [address] Phone: [phone] Fax: [phone]  [id number] - [place] [address] Phone: [phone] Fax: [phone]     Social Drivers of Health (SDOH) Social History: SDOH Screenings   Food Insecurity: No Food Insecurity (10/01/2023)  Housing: Unknown (10/01/2023)  Transportation Needs: No Transportation Needs (10/01/2023)  Utilities: Not At Risk (10/01/2023)  Depression (PHQ2-9): Low Risk  (09/28/2023)  Financial Resource Strain: Patient Declined (05/27/2023)  Physical Activity: Insufficiently Active (05/27/2023)  Social Connections: Unknown (05/27/2023)  Stress: Stress Concern Present (05/27/2023)  Tobacco Use: Low Risk  (09/30/2023)   SDOH Interventions:     Readmission Risk Interventions     No data to display

## 2023-10-02 NOTE — Progress Notes (Signed)
Inpatient Rehab Coordinator Note:  I met with patient at bedside to discuss CIR recommendations and goals/expectations of CIR stay.  We reviewed 3 hrs/day of therapy, physician follow up, and average length of stay 2 weeks (dependent upon progress) with goals of supervision to mod I.  We discussed caregiver support and he feels that between his spouse and his brother he would have good support at discharge.  I've left a message with his spouse to discuss.  Also reviewed insurance and confirmed [employer].  Will need to get prior auth for CIR.  Note plans for IVIG through 12/22 so will look for potential admit early next weekend pending stability, insurance approval, and bed availability.    Estill Dooms, PT, DPT Admissions Coordinator [phone] 10/02/23  2:34 PM

## 2023-10-03 DIAGNOSIS — R292 Abnormal reflex: Secondary | ICD-10-CM | POA: Diagnosis not present

## 2023-10-03 DIAGNOSIS — G610 Guillain-Barre syndrome: Secondary | ICD-10-CM | POA: Diagnosis not present

## 2023-10-03 MED ORDER — GABAPENTIN 100 MG PO CAPS
100.0000 mg | ORAL_CAPSULE | Freq: Two times a day (BID) | ORAL | Status: DC
  Administered 2023-10-03 – 2023-10-05 (×4): 100 mg via ORAL
  Filled 2023-10-03 (×5): qty 1

## 2023-10-03 MED ORDER — GABAPENTIN 100 MG PO CAPS
100.0000 mg | ORAL_CAPSULE | Freq: Once | ORAL | Status: AC
  Administered 2023-10-03: 100 mg via ORAL
  Filled 2023-10-03: qty 1

## 2023-10-03 NOTE — Progress Notes (Signed)
NIF -40  VC 2.5

## 2023-10-03 NOTE — Progress Notes (Signed)
[facility] PROGRESS NOTE    Progress Note  Jonathan Luna  NWG:[id number] DOB: 1966-06-12 DOA: 09/30/2023 PCP: [name], MD     Brief Narrative:   Jonathan Luna is an 57 y.o. male past medical history of hypertension hypothyroidism neuropathy and gait abnormality, pneumonia treated with antibiotics in October, then again in November was admitted for sepsis and pneumonia and treated with antibiotics, was seen in the neurologist office for worsening gait abnormality and ascending paralysis started 1 month prior to admission presently getting worst the neurologist was concerned about AIDP [name] variant sent to the ED for admission and IVIG.   Assessment/Plan:   CIDP (chronic inflammatory demyelinating polyneuropathy) (HCC) MRI of the L-spine showed degenerative disease most prominent L4-L5. Allergies on board recommended to continue IVIG end date 10/04/2023. Continue negative inspiratory force and vital capacity every 12 hours. PT OT evaluated the patient will need inpatient rehab.  Essential hypertension: Continue Avapro and metoprolol blood pressures relatively well-controlled.  Might need a diuretic as an outpatient.  Will defer to PCP. Blood pressure seems to be stable.  Hypothyroidism Continue Synthroid.  Chronic pain: Continue oxycodone.  Anxiety: Continue sertraline.   DVT prophylaxis: lovenox Family Communication:none Status is: Inpatient Remains inpatient appropriate because: Acute on chronic inflammatory demyelinating polyneuropathy    Code Status:     Code Status Orders  (From admission, onward)           Start     Ordered   09/30/23 1733  Full code  Continuous       Question:  By:  Answer:  Consent: discussion documented in EHR   09/30/23 1745           Code Status History     Date Active Date Inactive Code Status Order ID Comments User Context   08/28/2023 1940 08/30/2023 2205 Full Code [id number]  Nolberto Hanlon, MD ED          IV Access:   Peripheral IV   Procedures and diagnostic studies:   MR BRAIN WO CONTRAST Result Date: 10/01/2023 CLINICAL DATA:  Sepsis EXAM: MRI HEAD WITHOUT CONTRAST TECHNIQUE: Multiplanar, multiecho pulse sequences of the brain and surrounding structures were obtained without intravenous contrast. COMPARISON:  09/23/2023 FINDINGS: Brain: No acute infarct, mass effect or extra-axial collection. No acute or chronic hemorrhage. Normal white matter signal, parenchymal volume and CSF spaces. The midline structures are normal. Vascular: Normal flow voids. Skull and upper cervical spine: Normal calvarium and skull base. Visualized upper cervical spine and soft tissues are normal. Sinuses/Orbits:No paranasal sinus fluid levels or advanced mucosal thickening. No mastoid or middle ear effusion. Normal orbits. IMPRESSION: Normal brain MRI. Electronically Signed   By: Deatra Robinson M.D.   On: 10/01/2023 19:42     Medical Consultants:   None.   Subjective:    Jonathan Luna has no new complaints today, he relates his shaking when reaching for objects is improved.  Objective:    Vitals:   10/02/23 1927 10/02/23 2323 10/02/23 2349 10/03/23 0242  BP: 129/85 (!) 153/104 (!) 149/99 (!) 139/92  Pulse: 87 74  71  Resp: 15 19 18 17   Temp: 97.9 F (36.6 C) 97.8 F (36.6 C)  97.6 F (36.4 C)  TempSrc: Oral Oral  Oral  SpO2: 98% 98%  96%  Weight:      Height:       SpO2: 96 %   Intake/Output Summary (Last 24 hours) at 10/03/2023 5284 Last data filed at 10/02/2023  1800 Gross per 24 hour  Intake 900 ml  Output --  Net 900 ml    Filed Weights   09/30/23 1357 10/01/23 0410  Weight: 83.9 kg 83.1 kg    Exam: General exam: In no acute distress. Respiratory system: Good air movement and clear to auscultation. Cardiovascular system: S1 & S2 heard, RRR. No JVD. Gastrointestinal system: Abdomen is nondistended, soft and nontender.  Extremities: No pedal edema. Skin: No rashes,  lesions or ulcers Psychiatry: Judgement and insight appear normal. Mood & affect appropriate.  Data Reviewed:    Labs: Basic Metabolic Panel: Recent Labs  Lab 09/30/23 1553 09/30/23 1618 10/01/23 0529  NA 132* 128* 133*  K 4.4 4.2 3.6  CL 97*  --  99  CO2 21*  --  27  GLUCOSE 81  --  85  BUN 8  --  11  CREATININE 0.78  --  0.79  CALCIUM 9.7  --  9.0   GFR Estimated Creatinine Clearance: 105 mL/min (by C-G formula based on SCr of 0.79 mg/dL). Liver Function Tests: Recent Labs  Lab 10/01/23 0529  AST 21  ALT 18  ALKPHOS 54  BILITOT 0.8  PROT 7.3  ALBUMIN 3.6   No results for input(s): "LIPASE", "AMYLASE" in the last 168 hours. No results for input(s): "AMMONIA" in the last 168 hours. Coagulation profile No results for input(s): "INR", "PROTIME" in the last 168 hours. COVID-19 Labs  No results for input(s): "DDIMER", "FERRITIN", "LDH", "CRP" in the last 72 hours.  Lab Results  Component Value Date   SARSCOV2NAA NEGATIVE 09/30/2023   SARSCOV2NAA NEGATIVE 08/28/2023   SARSCOV2NAA Not Detected 02/27/2022    CBC: Recent Labs  Lab 09/30/23 1553 09/30/23 1618 10/01/23 0529  WBC 10.4  --  6.4  NEUTROABS 4.7  --   --   HGB 15.4 15.0 14.3  HCT 44.7 44.0 41.9  MCV 90.3  --  89.5  PLT 354  --  323   Cardiac Enzymes: No results for input(s): "CKTOTAL", "CKMB", "CKMBINDEX", "TROPONINI" in the last 168 hours. BNP (last 3 results) Recent Labs    09/04/23 1021  PROBNP 6.0   CBG: No results for input(s): "GLUCAP" in the last 168 hours. D-Dimer: No results for input(s): "DDIMER" in the last 72 hours. Hgb A1c: No results for input(s): "HGBA1C" in the last 72 hours. Lipid Profile: No results for input(s): "CHOL", "HDL", "LDLCALC", "TRIG", "CHOLHDL", "LDLDIRECT" in the last 72 hours. Thyroid function studies: No results for input(s): "TSH", "T4TOTAL", "T3FREE", "THYROIDAB" in the last 72 hours.  Invalid input(s): "FREET3" Anemia work up: No results for  input(s): "VITAMINB12", "FOLATE", "FERRITIN", "TIBC", "IRON", "RETICCTPCT" in the last 72 hours. Sepsis Labs: Recent Labs  Lab 09/30/23 1552 09/30/23 1553 10/01/23 0529  WBC  --  10.4 6.4  LATICACIDVEN 1.7  --   --    Microbiology Recent Results (from the past 240 hours)  Resp panel by RT-PCR (RSV, Flu A&B, Covid) Anterior Nasal Swab     Status: None   Collection Time: 09/30/23  4:40 PM   Specimen: Anterior Nasal Swab  Result Value Ref Range Status   SARS Coronavirus 2 by RT PCR NEGATIVE NEGATIVE Final   Influenza A by PCR NEGATIVE NEGATIVE Final   Influenza B by PCR NEGATIVE NEGATIVE Final    Comment: (NOTE) The Xpert Xpress SARS-CoV-2/FLU/RSV plus assay is intended as an aid in the diagnosis of influenza from Nasopharyngeal swab specimens and should not be used as a sole basis for treatment.  Nasal washings and aspirates are unacceptable for Xpert Xpress SARS-CoV-2/FLU/RSV testing.  Fact Sheet for Patients: BloggerCourse.com  Fact Sheet for Healthcare Providers: SeriousBroker.it  This test is not yet approved or cleared by the [place] FDA and has been authorized for detection and/or diagnosis of SARS-CoV-2 by FDA under an Emergency Use Authorization (EUA). This EUA will remain in effect (meaning this test can be used) for the duration of the COVID-19 declaration under Section 564(b)(1) of the Act, 21 U.S.C. section 360bbb-3(b)(1), unless the authorization is terminated or revoked.     Resp Syncytial Virus by PCR NEGATIVE NEGATIVE Final    Comment: (NOTE) Fact Sheet for Patients: BloggerCourse.com  Fact Sheet for Healthcare Providers: SeriousBroker.it  This test is not yet approved or cleared by the [place] FDA and has been authorized for detection and/or diagnosis of SARS-CoV-2 by FDA under an Emergency Use Authorization (EUA). This EUA will remain in  effect (meaning this test can be used) for the duration of the COVID-19 declaration under Section 564(b)(1) of the Act, 21 U.S.C. section 360bbb-3(b)(1), unless the authorization is terminated or revoked.  Performed at [facility], [address], [place], [place] [address]      Medications:    acetaminophen  1,000 mg Oral Once   enoxaparin (LOVENOX) injection  40 mg Subcutaneous Q24H   irbesartan  300 mg Oral Daily   levothyroxine  125 mcg Oral Q0600   metoprolol succinate  25 mg Oral Daily   sertraline  25 mg Oral Daily   sodium chloride flush  3 mL Intravenous Q12H   Continuous Infusions:  Immune Globulin 10% Stopped (10/02/23 1842)      LOS: 3 days   Marinda Elk  [facility]  10/03/2023, 7:23 AM

## 2023-10-03 NOTE — Progress Notes (Signed)
NEUROLOGY CONSULT FOLLOW UP NOTE   Date of service: October 03, 2023 Patient Name: Jonathan Luna MRN:  [id number] DOB:  [birth year]  Brief HPI  [name] is a 57 y.o. male with {MH significant for Pneumonia (November), HLD, hypothyroidism, Peripheral Neuropathy, B/l Panic attacks, anxiety, low back pain who presented from neurologist office for IVIG therapy for CIDP.  Pateint reportes weakness starting in his lower legs with his legs bucking during and since he has penumonia and was on multiple courses of antibiotics in late Oct/early Nov. He was hospitalized for the pneumonia at [facility] for 3 days. He feels that his weakness has progressed and ambulation has become very difficult for him with weakness now noted to his wrists. No difficulty with swallowing reported, but does have SOB when even mild activity.   Interval Hx/subjective    - Started on IVIG 12/18, tolerating medication well without complications  Patient reports he feels slightly stronger every day. He is working hard with PT.   Vitals   Vitals:   10/03/23 0242 10/03/23 0700 10/03/23 1100 10/03/23 1500  BP: (!) 139/92 (!) 150/104 (!) 138/91 139/88  Pulse: 71 87 85 76  Resp: 17     Temp: 97.6 F (36.4 C) 97.6 F (36.4 C) 98 F (36.7 C) 98.6 F (37 C)  TempSrc: Oral Oral Oral Axillary  SpO2: 96% 98% 98% 98%  Weight:      Height:         Body mass index is 28.69 kg/m.  Physical Exam   Constitutional: Appears well-developed and well-nourished.  Psych: Affect appropriate to situation. Slightly anxious regarding further treatment and increased healthcare bills.  Eyes: No scleral injection. HENT: No OP obstrucion.  Head: Normocephalic.  Cardiovascular: ST on monitor.  Respiratory: Effort normal, non-labored breathing. 2L [place].  GI: Soft.  No distension. There is no tenderness.  Skin: WDI (clothed on exam).   Neurologic Examination   Neuro: Mental Status: Patient is awake, alert, oriented to person, place, month,  year, and situation. Patient is able to give a clear and coherent history. No signs of aphasia or neglect Cranial Nerves: I: chronic visual field deficit. Glasses at baseline that improve patient to be able to read, etc.  Pupils are equal, round, and reactive to light with left pupil larger than right. Bilateral irregular shape.  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 No ptosis or diploplia.  V: Facial sensation is symmetric to temperature VII: slight left lower facial droop VIII: hearing is intact to voice X: Uvula elevates symmetrically XI: Shoulder shrug is symmetric. XII: tongue is midline without atrophy or fasciculations.  Motor: Neck flexion/extension: 4+/5 no pain noted with movement BUE: 4+/5 shoulder, 4+/5 bicep/tricep, 4/5 grip, 4-/5 wrist flexion and extension, 4/5 finger abduction.  BLE: 4/5 hip flexion, 4+/5 knee flexion/extension, 4-/5 dorsal flexion, 4/5 plantar flexion. Normal tone and bulk.  Sensory: Sensation is symmetric to light touch and temperature in the hands, arms and legs. Vibration sensation noted in left thigh, superior Deep Tendon Reflexes: Areflexia continues. Perhaps slight reflexic movement in lower extremities, but very slight and inconsistent.  Cerebellar: FNF intact bilaterally with shaking noted bilaterally, worse in left hand than in right. Some dysmetria with HKS, but noted to be improved by patient.    Medications  Current Facility-Administered Medications:    acetaminophen (TYLENOL) tablet 650 mg, 650 mg, Oral, Q6H PRN **OR** acetaminophen (TYLENOL) suppository 650 mg, 650 mg, Rectal, Q6H PRN, Beola Cord  B, MD   acetaminophen (TYLENOL) tablet 1,000 mg, 1,000 mg, Oral, Once, Synetta Fail, MD   clonazePAM Scarlette Calico) tablet 0.5 mg, 0.5 mg, Oral, BID PRN, Marinda Elk, MD, 0.5 mg at 10/03/23 0824   enoxaparin (LOVENOX) injection 40 mg, 40 mg, Subcutaneous, Q24H, Synetta Fail, MD, 40 mg at 10/02/23 2031   gabapentin (NEURONTIN) capsule 100 mg, 100 mg, Oral, BID, Marinda Elk, MD, 100 mg at 10/03/23 1610   hydrocortisone cream 0.5 %, , Topical, Daily PRN, Marinda Elk, MD   irbesartan Evlyn Kanner) tablet 300 mg, 300 mg, Oral, Daily, Marinda Elk, MD, 300 mg at 10/03/23 9604   levothyroxine (SYNTHROID) tablet 125 mcg, 125 mcg, Oral, Q0600, Synetta Fail, MD, 125 mcg at 10/03/23 5409   metoprolol succinate (TOPROL-XL) 24 hr tablet 25 mg, 25 mg, Oral, Daily, Marinda Elk, MD, 25 mg at 10/03/23 8119   oxyCODONE-acetaminophen (PERCOCET/ROXICET) 5-325 MG per tablet 1 tablet, 1 tablet, Oral, Q8H PRN, Synetta Fail, MD, 1 tablet at 10/03/23 1655   polyethylene glycol (MIRALAX / GLYCOLAX) packet 17 g, 17 g, Oral, Daily PRN, Synetta Fail, MD   sertraline (ZOLOFT) tablet 25 mg, 25 mg, Oral, Daily, Synetta Fail, MD, 25 mg at 10/03/23 1478   sodium chloride flush (NS) 0.9 % injection 3 mL, 3 mL, Intravenous, Q12H, Synetta Fail, MD, 3 mL at 10/03/23 0825 Labs and Diagnostic Imaging   CBC:  Recent Labs  Lab 09/30/23 1553 09/30/23 1618 10/01/23 0529  WBC 10.4  --  6.4  NEUTROABS 4.7  --   --   HGB 15.4 15.0 14.3  HCT 44.7 44.0 41.9  MCV 90.3  --  89.5  PLT 354  --  323    Basic Metabolic Panel:  Lab Results  Component Value Date   NA 133 (L) 10/01/2023   K 3.6 10/01/2023   CO2 27 10/01/2023   GLUCOSE 85 10/01/2023   BUN 11 10/01/2023   CREATININE 0.79 10/01/2023   CALCIUM 9.0 10/01/2023   GFRNONAA >60 10/01/2023   GFRAA 94 08/16/2008   Lipid Panel:  Lab Results  Component Value Date   LDLCALC 104 (H) 01/26/2023   HgbA1c:  Lab Results  Component Value Date   HGBA1C 5.3 09/22/2023   Urine Drug Screen:     Component Value Date/Time   LABOPIA NEG 09/29/2012 0830   COCAINSCRNUR Negative 08/18/2023 1114   COCAINSCRNUR NEG 09/29/2012 0830   LABBENZ POS (A) 09/29/2012 0830   AMPHETMU NEG  09/29/2012 0830    Alcohol Level No results found for: "ETH" INR  Lab Results  Component Value Date   INR 1.0 08/29/2023   APTT  Lab Results  Component Value Date   APTT 32 08/29/2023   MRI Cervical, thoracic and lumbar spine(Personally reviewed): Some minor disc degenerative changes but no significant spinal cord compression, most prominent at L4-L5 with b/l foraminal narrowing and possible enhancement of nerve roots.   MRI Brain(Personally reviewed): No acute abnormality   Assessment   ARMANII BOELENS is a 57 y.o. male with PMHx significant for Anxiety, Arthritis, Cataract, Depression, Detached retina, Heart murmur, History of bilateral cataract extraction, HLD, Hypothyroidism, Low back pain, and panic attacks who presents from his outpatient neurology office 12/18 for IVIG for ongoing CIDP with approximately 2 months of ascending weakness and numbness in his arms and legs bilaterally and leg weakness affecting his gait significantly.  Patient states that when he attempts to ambulate, his legs  constantly buckle and he reports burning and tingling of bilateral feet with numbness in bilateral palms.. Patient also complains of wrist weakness, arm weakness and incoordination.   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  Strength slowly improving each day on IVIG. MRI brain negative.   Recommendations   - Continue IVIG Q24H for a total of 5 doses (end date 12/22) - continue NIF/VC Q6H - PT/OT  - We did reach out to Dr. Terrace Arabia via chat and telephone, did not hear back. With the weekend and holiday week next week, and as patient has an appointment for an outpatient LP 12/31, we recommend that he keep this appointment and have the LP done as planned to avoid any missing lab orders needed and needing to have the procedure done twice.    _______________________________________________________________ Pt seen by Neuro NP/APP and later by MD. Note/plan to be edited by MD as needed.    Lynnae January, DNP, AGACNP-BC [facility] Please use AMION for contact information & EPIC for messaging.   Attending Neurohospitalist Addendum Patient seen and examined with APP/Resident. Agree with the history and physical as documented above. Agree with the plan as documented, which I helped formulate. I have edited the note above to reflect my full findings and recommendations. I have independently reviewed the chart, obtained history, review of systems and examined the patient.I have personally reviewed pertinent head/neck/spine imaging (CT/MRI). Please feel free to call with any questions.  -- Bing Neighbors, MD [facility] [phone]  If 7pm- 7am, please page neurology on call as listed in AMION.

## 2023-10-03 NOTE — Plan of Care (Signed)

## 2023-10-04 DIAGNOSIS — G610 Guillain-Barre syndrome: Secondary | ICD-10-CM | POA: Diagnosis not present

## 2023-10-04 MED ORDER — IMMUNE GLOBULIN (HUMAN) 10 GM/100ML IV SOLN
25.0000 g | Freq: Once | INTRAVENOUS | Status: AC
  Administered 2023-10-04: 25 g via INTRAVENOUS
  Filled 2023-10-04: qty 250

## 2023-10-04 NOTE — Progress Notes (Signed)
NEUROLOGY CONSULT FOLLOW UP NOTE   Date of service: October 04, 2023 Patient Name: Jonathan Luna MRN:  [id number] DOB:  [birth year]  Brief HPI  Jonathan Luna is a 57 y.o. male with PMH significant for Pneumonia (November), HLD, hypothyroidism, Peripheral Neuropathy, B/l Panic attacks, anxiety, low back pain who presented from Dr. 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   Pateint reportes weakness starting in his lower legs with his legs bucking during and since he has penumonia and was on multiple courses of antibiotics in late Oct/early Nov. He was hospitalized for the pneumonia at [facility] for 3 days. He feels that his weakness has progressed and ambulation has become very difficult for him with weakness now noted to his wrists. No difficulty with swallowing reported, but does have SOB when even mild activity.   Interval Hx/subjective   Started on IVIG 12/18, tolerating medication well without complications  Strength has improved significantly since starting IVIG. Working hard with PT who is recommending acute rehab. NIFs stable -40.   Vitals   Vitals:   10/04/23 0335 10/04/23 0835 10/04/23 0943 10/04/23 1505  BP:  (!) 141/90 (!) 141/90 (!) 135/96  Pulse:  94 94 78  Resp:  17  18  Temp:  98.3 F (36.8 C)  98.3 F (36.8 C)  TempSrc: Oral     SpO2:  96%  91%  Weight:      Height:         Body mass index is 28.69 kg/m.  Physical Exam   Constitutional: Appears well-developed and well-nourished.  Psych: Affect appropriate to situation. Slightly anxious regarding further treatment and increased healthcare bills.  Eyes: No scleral injection. HENT: No OP obstrucion.  Head: Normocephalic.  Cardiovascular: ST on monitor.  Respiratory: Effort normal, non-labored breathing. 2L [employer].  GI: Soft.  No distension. There is no tenderness.  Skin: WDI (clothed on exam).   Neurologic Examination   Neuro: Mental Status: Patient is awake, alert, oriented to person, place, month,  year, and situation. Patient is able to give a clear and coherent history. No signs of aphasia or neglect Cranial Nerves: I: chronic visual field deficit. Glasses at baseline that improve patient to be able to read, etc.  Pupils are equal, round, and reactive to light with left pupil larger than right. Bilateral irregular shape.  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 No ptosis or diploplia.  V: Facial sensation is symmetric to temperature VII: slight left lower facial droop VIII: hearing is intact to voice X: Uvula elevates symmetrically XI: Shoulder shrug is symmetric. XII: tongue is midline without atrophy or fasciculations.  Motor: Neck flexion/extension: 4+/5 no pain noted with movement BUE: 4+/5 shoulder, 4+/5 bicep/tricep, 4+/5 grip, 4/5 wrist flexion and extension, 4+/5 finger abduction.  BLE: 4+/5 hip flexion, 4+/5 knee flexion/extension, 4/5 dorsal flexion, 4+/5 plantar flexion. Normal tone and bulk.  Sensory: Sensation is symmetric to light touch and temperature in the hands, arms and legs. Vibration sensation noted in left thigh, superior Deep Tendon Reflexes: Areflexia continues. Perhaps slight reflexic movement in lower extremities, but very slight and inconsistent.  Cerebellar: FNF intact bilaterally with shaking noted bilaterally, worse in left hand than in right. Some dysmetria with HKS, but noted to be improved by patient.    Medications  Current Facility-Administered Medications:    acetaminophen (TYLENOL) tablet 650 mg, 650 mg, Oral, Q6H PRN **OR** acetaminophen (TYLENOL) suppository 650 mg, 650 mg, Rectal, Q6H PRN,  Synetta Fail, MD   acetaminophen (TYLENOL) tablet 1,000 mg, 1,000 mg, Oral, Once, Synetta Fail, MD   clonazePAM Scarlette Calico) tablet 0.5 mg, 0.5 mg, Oral, BID PRN, Marinda Elk, MD, 0.5 mg at 10/04/23 0943   enoxaparin (LOVENOX) injection 40 mg, 40 mg, Subcutaneous, Q24H, Synetta Fail, MD, 40 mg at 10/03/23 1958   gabapentin (NEURONTIN) capsule 100 mg, 100 mg, Oral, BID, Marinda Elk, MD, 100 mg at 10/03/23 2107   hydrocortisone cream 0.5 %, , Topical, Daily PRN, Marinda Elk, MD   Immune Globulin 10% (PRIVIGEN) IV infusion 25 g, 25 g, Intravenous, Once, Paytes, Austin A, RPH   irbesartan (AVAPRO) tablet 300 mg, 300 mg, Oral, Daily, Marinda Elk, MD, 300 mg at 10/04/23 0943   levothyroxine (SYNTHROID) tablet 125 mcg, 125 mcg, Oral, Q0600, Synetta Fail, MD, 125 mcg at 10/04/23 0554   metoprolol succinate (TOPROL-XL) 24 hr tablet 25 mg, 25 mg, Oral, Daily, Marinda Elk, MD, 25 mg at 10/04/23 0943   oxyCODONE-acetaminophen (PERCOCET/ROXICET) 5-325 MG per tablet 1 tablet, 1 tablet, Oral, Q8H PRN, Synetta Fail, MD, 1 tablet at 10/04/23 1357   polyethylene glycol (MIRALAX / GLYCOLAX) packet 17 g, 17 g, Oral, Daily PRN, Synetta Fail, MD   sertraline (ZOLOFT) tablet 25 mg, 25 mg, Oral, Daily, Synetta Fail, MD, 25 mg at 10/04/23 0945   sodium chloride flush (NS) 0.9 % injection 3 mL, 3 mL, Intravenous, Q12H, Synetta Fail, MD, 3 mL at 10/04/23 0945 Labs and Diagnostic Imaging   CBC:  Recent Labs  Lab 09/30/23 1553 09/30/23 1618 10/01/23 0529  WBC 10.4  --  6.4  NEUTROABS 4.7  --   --   HGB 15.4 15.0 14.3  HCT 44.7 44.0 41.9  MCV 90.3  --  89.5  PLT 354  --  323    Basic Metabolic Panel:  Lab Results  Component Value Date   NA 133 (L) 10/01/2023   K 3.6 10/01/2023   CO2 27 10/01/2023   GLUCOSE 85 10/01/2023   BUN 11 10/01/2023   CREATININE 0.79 10/01/2023   CALCIUM 9.0 10/01/2023   GFRNONAA >60 10/01/2023   GFRAA 94 08/16/2008   Lipid Panel:  Lab Results  Component Value Date   LDLCALC 104 (H) 01/26/2023   HgbA1c:  Lab Results  Component Value Date   HGBA1C 5.3 09/22/2023   Urine Drug Screen:     Component Value Date/Time   LABOPIA NEG 09/29/2012 0830   COCAINSCRNUR Negative 08/18/2023  1114   COCAINSCRNUR NEG 09/29/2012 0830   LABBENZ POS (A) 09/29/2012 0830   AMPHETMU NEG 09/29/2012 0830    Alcohol Level No results found for: "ETH" INR  Lab Results  Component Value Date   INR 1.0 08/29/2023   APTT  Lab Results  Component Value Date   APTT 32 08/29/2023   MRI Cervical, thoracic and lumbar spine(Personally reviewed): Some minor disc degenerative changes but no significant spinal cord compression, most prominent at L4-L5 with b/l foraminal narrowing and possible enhancement of nerve roots.   MRI Brain(Personally reviewed): No acute abnormality   Assessment   Jonathan Luna is a 57 y.o. male with PMHx significant for Anxiety, Arthritis, Cataract, Depression, Detached retina, Heart murmur, History of bilateral cataract extraction, HLD, Hypothyroidism, Low back pain, and panic attacks who presents from his outpatient neurology office 12/18 for IVIG for ongoing CIDP with approximately 2 months of ascending weakness and numbness in his arms and  legs bilaterally and leg weakness affecting his gait significantly.  Patient states that when he attempts to ambulate, his legs constantly buckle and he reports burning and tingling of bilateral feet with numbness in bilateral palms.. Patient also complains of wrist weakness, arm weakness and incoordination.    An outpatient NCS reveals evidence of acute demyelinating polyradiculoneuropathy (timeline is subacute) and patient was started on IVIG for 5 doses starting 12/18.  Today is day 5 and his strength has improved significantly although he is not back to baseline. He is extremely motivated and I agree with PT that he is an excellent candidate for acute rehab.  Note: We did reach out to Dr. Terrace Arabia via chat and telephone, did not hear back. With the weekend and holiday week next week, and as patient has an appointment for an outpatient LP 12/31, we recommend that he keep this appointment and have the LP done as planned to avoid any missing  lab orders needed and needing to have the procedure done twice.   Recommendations   - S/p 5 day course of IVIG end date 12/22 - NIFs daily while awaiting CIR - PT/OT - Patient cleared from neuro standpoint to discharge to CIR when they have a bed available - Patient should follow up with established outpatient neurologist Dr. Terrace Arabia after discharge  Neurology will not continue to actively follow but please re-engage if additional neurologic concerns arise.  Bing Neighbors, MD [facility] [phone]  If 7pm- 7am, please page neurology on call as listed in [facility]ION.

## 2023-10-04 NOTE — Progress Notes (Signed)
Physical Therapy Treatment Patient Details Name: Jonathan Luna MRN: [id number] DOB: 1966/08/03 Today's Date: 10/04/2023   History of Present Illness Pt is a 57 y/o male admitted 12/18 with an worsening gait abnormality and ascending paralysis.  MRI results pending.  IVIG started.  MD suspecting GBS.  PMHx  HTN, HLD, chronic pain, neuropathy, gait abnormality.    PT Comments  Pt seen for continued mobility and strengthening, demos good progress with endurance despite reports of fatigue. The pt continues to need minA for gait with cues for foot clearance, heel strike, and decreased foot slap. He also needed modA x2 to correct knee buckling with continued activity due to fatigue. Pt tolerated seated and standing exercises for LE strengthening well, is very motivated to regain strength and return to full independence. Continue to recommend intensive therapies when medically stable for d/c.     If plan is discharge home, recommend the following: A little help with walking and/or transfers;A little help with bathing/dressing/bathroom;Assistance with cooking/housework;Help with stairs or ramp for entrance   Can travel by private vehicle        Equipment Recommendations  Other (comment)    Recommendations for Other Services       Precautions / Restrictions Precautions Precautions: Fall Restrictions Weight Bearing Restrictions Per Provider Order: No     Mobility  Bed Mobility               General bed mobility comments: pt OOB at start and end of session    Transfers Overall transfer level: Needs assistance Equipment used: Rolling walker (2 wheels) Transfers: Sit to/from Stand Sit to Stand: Contact guard assist           General transfer comment: cues for safety/hand placement, completed x8    Ambulation/Gait Ambulation/Gait assistance: Min assist, Mod assist Gait Distance (Feet): 175 Feet (+ 100) Assistive device: Rolling walker (2 wheels) Gait Pattern/deviations:  Step-through pattern, Knees buckling, Shuffle, Decreased dorsiflexion - right, Decreased dorsiflexion - left Gait velocity: decreased     General Gait Details: pt with progressive instability and knee buckling with faituge. poor heel strike and foot slap noted bilaterally.   Stairs             Wheelchair Mobility     Tilt Bed    Modified Rankin (Stroke Patients Only)       Balance Overall balance assessment: Needs assistance Sitting-balance support: No upper extremity supported Sitting balance-Leahy Scale: Good     Standing balance support: Single extremity supported, Bilateral upper extremity supported Standing balance-Leahy Scale: Poor Standing balance comment: dependent on BUE support for dynamic balance                            Cognition Arousal: Alert Behavior During Therapy: WFL for tasks assessed/performed Overall Cognitive Status: Impaired/Different from baseline Area of Impairment: Memory                     Memory: Decreased short-term memory         General Comments: pt perseverating on having IVIG treatment, RN states he finished 5th dose last night and confirmed with pharmacy        Exercises General Exercises - Lower Extremity Long Arc Quad: Strengthening, Both, 5 reps, Other (comment), Seated (against mod resistance) Heel Slides: Strengthening, Both, 5 reps, Seated, Other (comment) (against mod resistance) Toe Raises: Strengthening, Both, 10 reps, Other (comment) (isometric against min resistance) Mini-Sqauts: Strengthening, 5  reps Other Exercises Other Exercises: standing lunges x10 RLE and x8 LLE    General Comments General comments (skin integrity, edema, etc.): VSS on RA      Pertinent Vitals/Pain Pain Assessment Pain Assessment: No/denies pain     PT Goals (current goals can now be found in the care plan section) Acute Rehab PT Goals Patient Stated Goal: back to independent PT Goal Formulation: With  patient Time For Goal Achievement: 10/15/23 Potential to Achieve Goals: Good Progress towards PT goals: Progressing toward goals    Frequency    Min 1X/week       AM-PAC PT "6 Clicks" Mobility   Outcome Measure  Help needed turning from your back to your side while in a flat bed without using bedrails?: A Little Help needed moving from lying on your back to sitting on the side of a flat bed without using bedrails?: A Little Help needed moving to and from a bed to a chair (including a wheelchair)?: A Little Help needed standing up from a chair using your arms (e.g., wheelchair or bedside chair)?: A Little Help needed to walk in hospital room?: A Little Help needed climbing 3-5 steps with a railing? : A Lot 6 Click Score: 17    End of Session Equipment Utilized During Treatment: Gait belt Activity Tolerance: Patient tolerated treatment well;Patient limited by fatigue Patient left: in chair;with call bell/phone within reach;with family/visitor present Nurse Communication: Mobility status PT Visit Diagnosis: Muscle weakness (generalized) (M62.81);Difficulty in walking, not elsewhere classified (R26.2);Other symptoms and signs involving the nervous system (R29.898) Pain - part of body:  (back)     Time: 1636-1710 PT Time Calculation (min) (ACUTE ONLY): 34 min  Charges:    $Therapeutic Exercise: 23-37 mins PT General Charges $$ ACUTE PT VISIT: 1 Visit                     Vickki Muff, PT, DPT   Acute Rehabilitation Department Office [phone] Secure Chat Communication Preferred    Ronnie Derby 10/04/2023, 5:25 PM

## 2023-10-04 NOTE — Plan of Care (Signed)
  Problem: Education: Goal: Knowledge of General Education information will improve Description: Including pain rating scale, medication(s)/side effects and non-pharmacologic comfort measures Outcome: Progressing   Problem: Clinical Measurements: Goal: Ability to maintain clinical measurements within normal limits will improve Outcome: Progressing Goal: Will remain free from infection Outcome: Progressing Goal: Respiratory complications will improve Outcome: Progressing Goal: Cardiovascular complication will be avoided Outcome: Progressing   Problem: Activity: Goal: Risk for activity intolerance will decrease Outcome: Progressing   Problem: Nutrition: Goal: Adequate nutrition will be maintained Outcome: Progressing   Problem: Coping: Goal: Level of anxiety will decrease Outcome: Progressing   Problem: Elimination: Goal: Will not experience complications related to urinary retention Outcome: Progressing   Problem: Pain Management: Goal: General experience of comfort will improve Outcome: Progressing   Problem: Safety: Goal: Ability to remain free from injury will improve Outcome: Progressing   Problem: Skin Integrity: Goal: Risk for impaired skin integrity will decrease Outcome: Progressing

## 2023-10-04 NOTE — Progress Notes (Signed)
Dose complete status placed on order for Immune Globulin, This nurse called pharmacy to clarify. Pharmacy fixed error pt will receive this medication on night shift

## 2023-10-04 NOTE — Progress Notes (Signed)
This nurse spoke with Pharmacy about immune Globulin discrepancy. Pharmacy states that pt. Received two doses on they 12/19 and they will discusses with on coming night MD

## 2023-10-04 NOTE — Progress Notes (Signed)
NIF -40 

## 2023-10-04 NOTE — Progress Notes (Signed)
[facility] PROGRESS NOTE    Progress Note  [name]  YQM:[id number] DOB: [birth year] DOA: 09/30/2023 PCP: [name], MD     Brief Narrative:   Jonathan Luna is an 57 y.o. male past medical history of hypertension hypothyroidism neuropathy and gait abnormality, pneumonia treated with antibiotics in October, then again in November was admitted for sepsis and pneumonia and treated with antibiotics, was seen in the neurologist office for worsening gait abnormality and ascending paralysis started 1 month prior to admission presently getting worst the neurologist was concerned about AIDP [name] variant sent to the ED for admission and IVIG.   Assessment/Plan:   CIDP (chronic inflammatory demyelinating polyneuropathy) (HCC) MRI of the L-spine showed degenerative disease most prominent L4-L5. Neurology on board recommended to continue IVIG end date 10/04/2023. Continue negative inspiratory force and vital capacity every 12 hours. PT OT evaluated the patient will need inpatient rehab. Transferred to MedSurg  Essential hypertension: Continue Avapro and metoprolol blood pressures relatively well-controlled.  Might need a diuretic as an outpatient.  Will defer to PCP. Blood pressure seems to be stable.  Hypothyroidism Continue Synthroid.  Chronic pain: Continue oxycodone.  Anxiety: Continue sertraline.   DVT prophylaxis: lovenox Family Communication:none Status is: Inpatient Remains inpatient appropriate because: Acute on chronic inflammatory demyelinating polyneuropathy    Code Status:     Code Status Orders  (From admission, onward)           Start     Ordered   09/30/23 1733  Full code  Continuous       Question:  By:  Answer:  Consent: discussion documented in EHR   09/30/23 1745           Code Status History     Date Active Date Inactive Code Status Order ID Comments User Context   08/28/2023 1940 08/30/2023 2205 Full Code [id number]   Nolberto Hanlon, MD ED         IV Access:   Peripheral IV   Procedures and diagnostic studies:   No results found.    Medical Consultants:   None.   Subjective:    Jonathan Luna relates he feels better leg strength is coming back.  Objective:    Vitals:   10/03/23 1930 10/03/23 2325 10/04/23 0332 10/04/23 0335  BP: (!) 145/88 128/77 (!) 139/92   Pulse: 82 80 74   Resp: 20 (!) 21 14   Temp: 98.2 F (36.8 C) 97.8 F (36.6 C) 98.4 F (36.9 C)   TempSrc: Oral Oral Oral Oral  SpO2: 100% 96% 96%   Weight:      Height:       SpO2: 96 %   Intake/Output Summary (Last 24 hours) at 10/04/2023 0644 Last data filed at 10/03/2023 2257 Gross per 24 hour  Intake 200 ml  Output --  Net 200 ml    Filed Weights   09/30/23 1357 10/01/23 0410  Weight: 83.9 kg 83.1 kg    Exam: General exam: In no acute distress. Respiratory system: Good air movement and clear to auscultation. Cardiovascular system: S1 & S2 heard, RRR. No JVD. Gastrointestinal system: Abdomen is nondistended, soft and nontender.  Extremities: No pedal edema. Skin: No rashes, lesions or ulcers Psychiatry: Judgement and insight appear normal. Mood & affect appropriate. Data Reviewed:    Labs: Basic Metabolic Panel: Recent Labs  Lab 09/30/23 1553 09/30/23 1618 10/01/23 0529  NA 132* 128* 133*  K 4.4 4.2 3.6  CL 97*  --  99  CO2 21*  --  27  GLUCOSE 81  --  85  BUN 8  --  11  CREATININE 0.78  --  0.79  CALCIUM 9.7  --  9.0   GFR Estimated Creatinine Clearance: 105 mL/min (by C-G formula based on SCr of 0.79 mg/dL). Liver Function Tests: Recent Labs  Lab 10/01/23 0529  AST 21  ALT 18  ALKPHOS 54  BILITOT 0.8  PROT 7.3  ALBUMIN 3.6   No results for input(s): "LIPASE", "AMYLASE" in the last 168 hours. No results for input(s): "AMMONIA" in the last 168 hours. Coagulation profile No results for input(s): "INR", "PROTIME" in the last 168 hours. COVID-19 Labs  No results for input(s):  "DDIMER", "FERRITIN", "LDH", "CRP" in the last 72 hours.  Lab Results  Component Value Date   SARSCOV2NAA NEGATIVE 09/30/2023   SARSCOV2NAA NEGATIVE 08/28/2023   SARSCOV2NAA Not Detected 02/27/2022    CBC: Recent Labs  Lab 09/30/23 1553 09/30/23 1618 10/01/23 0529  WBC 10.4  --  6.4  NEUTROABS 4.7  --   --   HGB 15.4 15.0 14.3  HCT 44.7 44.0 41.9  MCV 90.3  --  89.5  PLT 354  --  323   Cardiac Enzymes: No results for input(s): "CKTOTAL", "CKMB", "CKMBINDEX", "TROPONINI" in the last 168 hours. BNP (last 3 results) Recent Labs    09/04/23 1021  PROBNP 6.0   CBG: No results for input(s): "GLUCAP" in the last 168 hours. D-Dimer: No results for input(s): "DDIMER" in the last 72 hours. Hgb A1c: No results for input(s): "HGBA1C" in the last 72 hours. Lipid Profile: No results for input(s): "CHOL", "HDL", "LDLCALC", "TRIG", "CHOLHDL", "LDLDIRECT" in the last 72 hours. Thyroid function studies: No results for input(s): "TSH", "T4TOTAL", "T3FREE", "THYROIDAB" in the last 72 hours.  Invalid input(s): "FREET3" Anemia work up: No results for input(s): "VITAMINB12", "FOLATE", "FERRITIN", "TIBC", "IRON", "RETICCTPCT" in the last 72 hours. Sepsis Labs: Recent Labs  Lab 09/30/23 1552 09/30/23 1553 10/01/23 0529  WBC  --  10.4 6.4  LATICACIDVEN 1.7  --   --    Microbiology Recent Results (from the past 240 hours)  Resp panel by RT-PCR (RSV, Flu A&B, Covid) Anterior Nasal Swab     Status: None   Collection Time: 09/30/23  4:40 PM   Specimen: Anterior Nasal Swab  Result Value Ref Range Status   SARS Coronavirus 2 by RT PCR NEGATIVE NEGATIVE Final   Influenza A by PCR NEGATIVE NEGATIVE Final   Influenza B by PCR NEGATIVE NEGATIVE Final    Comment: (NOTE) The Xpert Xpress SARS-CoV-2/FLU/RSV plus assay is intended as an aid in the diagnosis of influenza from Nasopharyngeal swab specimens and should not be used as a sole basis for treatment. Nasal washings and aspirates are  unacceptable for Xpert Xpress SARS-CoV-2/FLU/RSV testing.  Fact Sheet for Patients: BloggerCourse.com  Fact Sheet for Healthcare Providers: SeriousBroker.it  This test is not yet approved or cleared by the [place] FDA and has been authorized for detection and/or diagnosis of SARS-CoV-2 by FDA under an Emergency Use Authorization (EUA). This EUA will remain in effect (meaning this test can be used) for the duration of the COVID-19 declaration under Section 564(b)(1) of the Act, 21 U.S.C. section 360bbb-3(b)(1), unless the authorization is terminated or revoked.     Resp Syncytial Virus by PCR NEGATIVE NEGATIVE Final    Comment: (NOTE) Fact Sheet for Patients: BloggerCourse.com  Fact Sheet for Healthcare Providers: SeriousBroker.it  This test is not  yet approved or cleared by the [place] and has been authorized for detection and/or diagnosis of SARS-CoV-2 by FDA under an Emergency Use Authorization (EUA). This EUA will remain in effect (meaning this test can be used) for the duration of the COVID-19 declaration under Section 564(b)(1) of the Act, 21 U.S.C. section 360bbb-3(b)(1), unless the authorization is terminated or revoked.  Performed at [facility], [address], [place], [place] [address]      Medications:    acetaminophen  1,000 mg Oral Once   enoxaparin (LOVENOX) injection  40 mg Subcutaneous Q24H   gabapentin  100 mg Oral BID   irbesartan  300 mg Oral Daily   levothyroxine  125 mcg Oral Q0600   metoprolol succinate  25 mg Oral Daily   sertraline  25 mg Oral Daily   sodium chloride flush  3 mL Intravenous Q12H   Continuous Infusions:      LOS: 4 days   Marinda Elk  [facility]  10/04/2023, 6:44 AM

## 2023-10-05 ENCOUNTER — Encounter (HOSPITAL_COMMUNITY): Payer: Self-pay | Admitting: Physical Medicine and Rehabilitation

## 2023-10-05 ENCOUNTER — Other Ambulatory Visit: Payer: Self-pay

## 2023-10-05 ENCOUNTER — Inpatient Hospital Stay (HOSPITAL_COMMUNITY)
Admission: AD | Admit: 2023-10-05 | Discharge: 2023-10-20 | DRG: 945 | Disposition: A | Discharge status: Home / Self Care | Payer: 59 | Source: Intra-hospital | Attending: Physical Medicine and Rehabilitation | Admitting: Physical Medicine and Rehabilitation

## 2023-10-05 DIAGNOSIS — E873 Alkalosis: Secondary | ICD-10-CM | POA: Diagnosis present

## 2023-10-05 DIAGNOSIS — Z825 Family history of asthma and other chronic lower respiratory diseases: Secondary | ICD-10-CM

## 2023-10-05 DIAGNOSIS — Z7982 Long term (current) use of aspirin: Secondary | ICD-10-CM

## 2023-10-05 DIAGNOSIS — Z833 Family history of diabetes mellitus: Secondary | ICD-10-CM | POA: Diagnosis not present

## 2023-10-05 DIAGNOSIS — E785 Hyperlipidemia, unspecified: Secondary | ICD-10-CM | POA: Diagnosis present

## 2023-10-05 DIAGNOSIS — G8250 Quadriplegia, unspecified: Secondary | ICD-10-CM | POA: Diagnosis not present

## 2023-10-05 DIAGNOSIS — R062 Wheezing: Secondary | ICD-10-CM | POA: Diagnosis present

## 2023-10-05 DIAGNOSIS — F410 Panic disorder [episodic paroxysmal anxiety] without agoraphobia: Secondary | ICD-10-CM

## 2023-10-05 DIAGNOSIS — G610 Guillain-Barre syndrome: Secondary | ICD-10-CM | POA: Diagnosis present

## 2023-10-05 DIAGNOSIS — G8929 Other chronic pain: Secondary | ICD-10-CM | POA: Diagnosis present

## 2023-10-05 DIAGNOSIS — Z79899 Other long term (current) drug therapy: Secondary | ICD-10-CM | POA: Diagnosis not present

## 2023-10-05 DIAGNOSIS — E039 Hypothyroidism, unspecified: Secondary | ICD-10-CM | POA: Diagnosis present

## 2023-10-05 DIAGNOSIS — R5381 Other malaise: Principal | ICD-10-CM | POA: Diagnosis present

## 2023-10-05 DIAGNOSIS — R000 Tachycardia, unspecified: Secondary | ICD-10-CM

## 2023-10-05 DIAGNOSIS — F32A Depression, unspecified: Secondary | ICD-10-CM | POA: Diagnosis present

## 2023-10-05 DIAGNOSIS — Z79891 Long term (current) use of opiate analgesic: Secondary | ICD-10-CM

## 2023-10-05 DIAGNOSIS — M545 Low back pain, unspecified: Secondary | ICD-10-CM | POA: Diagnosis present

## 2023-10-05 DIAGNOSIS — F419 Anxiety disorder, unspecified: Secondary | ICD-10-CM | POA: Diagnosis present

## 2023-10-05 DIAGNOSIS — E871 Hypo-osmolality and hyponatremia: Secondary | ICD-10-CM | POA: Diagnosis present

## 2023-10-05 DIAGNOSIS — I10 Essential (primary) hypertension: Secondary | ICD-10-CM | POA: Diagnosis present

## 2023-10-05 DIAGNOSIS — Z83438 Family history of other disorder of lipoprotein metabolism and other lipidemia: Secondary | ICD-10-CM

## 2023-10-05 DIAGNOSIS — Z8249 Family history of ischemic heart disease and other diseases of the circulatory system: Secondary | ICD-10-CM | POA: Diagnosis not present

## 2023-10-05 DIAGNOSIS — F54 Psychological and behavioral factors associated with disorders or diseases classified elsewhere: Secondary | ICD-10-CM

## 2023-10-05 DIAGNOSIS — Z803 Family history of malignant neoplasm of breast: Secondary | ICD-10-CM | POA: Diagnosis not present

## 2023-10-05 DIAGNOSIS — G629 Polyneuropathy, unspecified: Secondary | ICD-10-CM

## 2023-10-05 DIAGNOSIS — R202 Paresthesia of skin: Secondary | ICD-10-CM | POA: Diagnosis present

## 2023-10-05 DIAGNOSIS — M62838 Other muscle spasm: Secondary | ICD-10-CM | POA: Diagnosis present

## 2023-10-05 MED ORDER — PROCHLORPERAZINE 25 MG RE SUPP: 12.5000 mg | Freq: Four times a day (QID) | RECTAL | Status: DC | PRN

## 2023-10-05 MED ORDER — LEVOTHYROXINE SODIUM 25 MCG PO TABS
125.0000 ug | ORAL_TABLET | Freq: Every day | ORAL | Status: DC
  Administered 2023-10-06 – 2023-10-20 (×15): 125 ug via ORAL
  Filled 2023-10-05 (×15): qty 1

## 2023-10-05 MED ORDER — DIPHENHYDRAMINE HCL 25 MG PO CAPS: 25.0000 mg | ORAL_CAPSULE | Freq: Four times a day (QID) | ORAL | Status: DC | PRN

## 2023-10-05 MED ORDER — CLONAZEPAM 0.5 MG PO TABS
0.5000 mg | ORAL_TABLET | Freq: Two times a day (BID) | ORAL | Status: DC | PRN
Start: 2023-10-05 — End: 2023-10-20
  Administered 2023-10-05 – 2023-10-20 (×25): 0.5 mg via ORAL
  Filled 2023-10-05 (×28): qty 1

## 2023-10-05 MED ORDER — IRBESARTAN 300 MG PO TABS
300.0000 mg | ORAL_TABLET | Freq: Every day | ORAL | Status: DC
  Administered 2023-10-06 – 2023-10-20 (×15): 300 mg via ORAL
  Filled 2023-10-05 (×15): qty 1

## 2023-10-05 MED ORDER — FLEET ENEMA RE ENEM: 1.0000 | ENEMA | Freq: Once | RECTAL | Status: DC | PRN

## 2023-10-05 MED ORDER — GUAIFENESIN-DM 100-10 MG/5ML PO SYRP
5.0000 mL | ORAL_SOLUTION | Freq: Four times a day (QID) | ORAL | Status: DC | PRN
  Administered 2023-10-06 – 2023-10-18 (×16): 10 mL via ORAL
  Filled 2023-10-05 (×16): qty 10

## 2023-10-05 MED ORDER — GABAPENTIN 100 MG PO CAPS: 100.0000 mg | ORAL_CAPSULE | Freq: Two times a day (BID) | ORAL | Status: DC

## 2023-10-05 MED ORDER — PROCHLORPERAZINE EDISYLATE 10 MG/2ML IJ SOLN: 5.0000 mg | Freq: Four times a day (QID) | INTRAMUSCULAR | Status: DC | PRN

## 2023-10-05 MED ORDER — POLYETHYLENE GLYCOL 3350 17 G PO PACK: 17.0000 g | PACK | Freq: Every day | ORAL | Status: DC | PRN

## 2023-10-05 MED ORDER — TRAZODONE HCL 50 MG PO TABS
25.0000 mg | ORAL_TABLET | Freq: Every evening | ORAL | Status: DC | PRN
Start: 2023-10-05 — End: 2023-10-20
  Filled 2023-10-05: qty 1

## 2023-10-05 MED ORDER — GABAPENTIN 300 MG PO CAPS: 300.0000 mg | ORAL_CAPSULE | Freq: Every day | ORAL | Status: DC

## 2023-10-05 MED ORDER — GABAPENTIN 300 MG PO CAPS
300.0000 mg | ORAL_CAPSULE | Freq: Once | ORAL | Status: AC
  Administered 2023-10-05: 300 mg via ORAL
  Filled 2023-10-05: qty 1

## 2023-10-05 MED ORDER — OXYCODONE-ACETAMINOPHEN 5-325 MG PO TABS
1.0000 | ORAL_TABLET | Freq: Three times a day (TID) | ORAL | Status: DC | PRN
  Administered 2023-10-05 – 2023-10-20 (×40): 1 via ORAL
  Filled 2023-10-05 (×41): qty 1

## 2023-10-05 MED ORDER — ALUM & MAG HYDROXIDE-SIMETH 200-200-20 MG/5ML PO SUSP: 30.0000 mL | ORAL | Status: DC | PRN

## 2023-10-05 MED ORDER — SERTRALINE HCL 50 MG PO TABS
25.0000 mg | ORAL_TABLET | Freq: Every day | ORAL | Status: DC
  Administered 2023-10-06 – 2023-10-20 (×15): 25 mg via ORAL
  Filled 2023-10-05 (×15): qty 1

## 2023-10-05 MED ORDER — PROCHLORPERAZINE MALEATE 5 MG PO TABS
5.0000 mg | ORAL_TABLET | Freq: Four times a day (QID) | ORAL | Status: DC | PRN
Start: 2023-10-05 — End: 2023-10-20

## 2023-10-05 MED ORDER — ACETAMINOPHEN 325 MG PO TABS
325.0000 mg | ORAL_TABLET | ORAL | Status: DC | PRN
Start: 2023-10-05 — End: 2023-10-20
  Filled 2023-10-05 (×2): qty 2

## 2023-10-05 MED ORDER — ENOXAPARIN SODIUM 40 MG/0.4ML IJ SOSY
40.0000 mg | PREFILLED_SYRINGE | INTRAMUSCULAR | Status: DC
  Administered 2023-10-05 – 2023-10-11 (×7): 40 mg via SUBCUTANEOUS
  Filled 2023-10-05 (×7): qty 0.4

## 2023-10-05 MED ORDER — METOPROLOL SUCCINATE ER 25 MG PO TB24
25.0000 mg | ORAL_TABLET | Freq: Every day | ORAL | Status: DC
  Administered 2023-10-06 – 2023-10-20 (×15): 25 mg via ORAL
  Filled 2023-10-05 (×16): qty 1

## 2023-10-05 MED ORDER — BISACODYL 10 MG RE SUPP: 10.0000 mg | Freq: Every day | RECTAL | Status: DC | PRN

## 2023-10-05 MED ORDER — HYDROCORTISONE 0.5 % EX CREA: TOPICAL_CREAM | Freq: Every day | CUTANEOUS | Status: DC | PRN

## 2023-10-05 MED FILL — Immune Globulin (Human) IV Soln 20 GM/200ML: INTRAVENOUS | Qty: 200 | Status: AC

## 2023-10-05 MED FILL — Immune Globulin (Human) IV Soln 5 GM/50ML: INTRAVENOUS | Qty: 50 | Status: AC

## 2023-10-05 NOTE — Discharge Summary (Signed)
Physician Discharge Summary  Jonathan Luna TDD:[id number] DOB: 01-Dec-1965 DOA: 09/30/2023  PCP: [name], MD  Admit date: 09/30/2023 Discharge date: 10/05/2023  Admitted From: Home Discharge disposition: CIR   Brief narrative: Jonathan Luna is a 57 y.o. male with PMH significant for HTN, HLD, anxiety/depression, arthritis, hypothyroidism, neuropathy impaired gait 12/18, patient presented to the ED with complaint of progressively worsening gait abnormality. Of note, patient was treated for pneumonia in October.  In November, he was admitted for sepsis secondary to pneumonia and treated with antibiotics. During that time, he started to notice some weakness in the lower legs with his legs buckling at times when he walks.  He also noted numbness starting in his feet and ascending upwards as well as some numbness in the palms of his hands.  His symptoms progressed to a point where his fingers are weak even to hold a cup.  Denies any difficulty swallowing.  Able to hold a conversation without running out of breath. With the above symptoms, he was seen at neurologist office.  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 Seen by neurology Admitted to [facility]  Subjective: Patient was seen and examined this morning.  Pleasant middle-aged Caucasian male.  Not in distress Chart reviewed In the last 24 hours, afebrile, heart rate in 60s and 70s, blood pressure 130s and 140s, breathing on room air. 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 Neurology recommended 5 days of antibiotics which was completed on 10/04/2023. Continue negative inspiratory force and vital capacity every 12 hours. PT OT recommended inpatient rehab  Hyponatremia Sodium level low on last checks.  Repeat labs in next few days Recent Labs  Lab 09/30/23 1553  09/30/23 1618 10/01/23 0529  NA 132* 128* 133*    Essential hypertension Currently blood pressure controlled on Toprol 25 mg daily and irbesartan 300 mg daily.  Blood pressure seems to be stable.   Hypothyroidism Continue Synthroid.   Chronic pain Continue oxycodone.   Anxiety Continue sertraline.   Mobility: Encourage ambulation.  Goals of care   Code Status: Full Code    Diet:  Diet Order             Diet general           Diet regular Room service appropriate? Yes; Fluid consistency: Thin  Diet effective now                   Nutritional status:  Body mass index is 28.69 kg/m.       Wounds:  -    Discharge Exam:   Vitals:   10/04/23 2220 10/04/23 2300 10/05/23 0400 10/05/23 0805  BP: (!) 141/98 130/83 (!) 144/94 (!) 136/95  Pulse: 81 68 79 95  Resp: 18 18 19 17   Temp: 98.2 F (36.8 C) 97.9 F (36.6 C) 97.7 F (36.5 C) 97.8 F (36.6 C)  TempSrc: Axillary Axillary Oral Oral  SpO2: 94% 96% 97% 100%  Weight:      Height:        Body mass index is 28.69 kg/m.  General exam: Pleasant, middle-aged Caucasian male.  Not in distress Skin: No rashes, lesions or ulcers. HEENT: Atraumatic, normocephalic, no obvious bleeding Lungs: Clear to auscultation bilaterally CVS: S1, S2, no murmur GI/Abd: Soft, nontender, nondistended, bowel sound present CNS: Alert, awake, oriented x 3  Psychiatry: Mood appropriate Extremities: No pedal edema, no calf tenderness  Follow ups:    Follow-up Information     [name], MD Follow up.   Specialty: Internal Medicine Contact information: [address] [place] [address] [phone]                 Discharge Instructions:   Discharge Instructions     Call MD for:  difficulty breathing, headache or visual disturbances   Complete by: As directed    Call MD for:  extreme fatigue   Complete by: As directed    Call MD for:  hives   Complete by: As directed    Call MD for:   persistant dizziness or light-headedness   Complete by: As directed    Call MD for:  persistant nausea and vomiting   Complete by: As directed    Call MD for:  severe uncontrolled pain   Complete by: As directed    Call MD for:  temperature >100.4   Complete by: As directed    Diet general   Complete by: As directed    Increase activity slowly   Complete by: As directed        Discharge Medications:   Allergies as of 10/05/2023       Reactions   Amoxicillin Other (See Comments)   Pt states that it does not work for him when he had back to back pneumonia   Effexor [venlafaxine] Diarrhea   Fluoxetine Hcl    Prozac [fluoxetine Hcl] Diarrhea        Medication List     STOP taking these medications    indapamide 1.25 MG tablet Commonly known as: LOZOL       TAKE these medications    aspirin EC 81 MG tablet Take 81 mg by mouth daily. Swallow whole.   clonazePAM 0.5 MG tablet Commonly known as: KLONOPIN Take 1 tablet (0.5 mg total) by mouth 2 (two) times daily as needed for anxiety.   fluticasone 50 MCG/ACT nasal spray Commonly known as: FLONASE Place 2 sprays into both nostrils daily.   gabapentin 300 MG capsule Commonly known as: NEURONTIN Take 2 capsules (600 mg total) by mouth 3 (three) times daily.   irbesartan 300 MG tablet Commonly known as: AVAPRO Take 1 tablet (300 mg total) by mouth daily. What changed:  when to take this additional instructions   metoprolol succinate 25 MG 24 hr tablet Commonly known as: Toprol XL Take 1 tablet (25 mg total) by mouth daily.   oxyCODONE-acetaminophen 5-325 MG tablet Commonly known as: PERCOCET/ROXICET Take 1 tablet by mouth every 8 (eight) hours as needed for severe pain (pain score 7-10).   sertraline 25 MG tablet Commonly known as: ZOLOFT Take 1 tablet (25 mg total) by mouth daily.   Unithroid 125 MCG tablet Generic drug: levothyroxine Take 1 tablet (125 mcg total) by mouth daily before  breakfast. What changed: when to take this   Vitamin D-3 25 MCG (1000 UT) Caps Take 1 capsule by mouth daily.         The results of significant diagnostics from this hospitalization (including imaging, microbiology, ancillary and laboratory) are listed below for reference.    Procedures and Diagnostic Studies:   MR BRAIN WO CONTRAST Result Date: 10/01/2023 CLINICAL DATA:  Sepsis EXAM: MRI HEAD WITHOUT CONTRAST TECHNIQUE: Multiplanar, multiecho pulse sequences of the brain and surrounding structures were obtained without intravenous contrast. COMPARISON:  09/23/2023 FINDINGS: Brain: No acute infarct, mass  effect or extra-axial collection. No acute or chronic hemorrhage. Normal white matter signal, parenchymal volume and CSF spaces. The midline structures are normal. Vascular: Normal flow voids. Skull and upper cervical spine: Normal calvarium and skull base. Visualized upper cervical spine and soft tissues are normal. Sinuses/Orbits:No paranasal sinus fluid levels or advanced mucosal thickening. No mastoid or middle ear effusion. Normal orbits. IMPRESSION: Normal brain MRI. Electronically Signed   By: Deatra Robinson M.D.   On: 10/01/2023 19:42   CT CHEST WO CONTRAST Result Date: 09/30/2023 CLINICAL DATA:  Respiratory illness abnormal chest x-ray EXAM: CT CHEST WITHOUT CONTRAST TECHNIQUE: Multidetector CT imaging of the chest was performed following the standard protocol without IV contrast. RADIATION DOSE REDUCTION: This exam was performed according to the departmental dose-optimization program which includes automated exposure control, adjustment of the mA and/or kV according to patient size and/or use of iterative reconstruction technique. COMPARISON:  09/30/2023, CT chest 08/28/2023 FINDINGS: Cardiovascular: Limited assessment without intravenous contrast. Nonaneurysmal aorta. Normal cardiac size. Mild coronary vascular calcification. Trace pericardial effusion Mediastinum/Nodes: Patent  trachea. No thyroid mass. No suspicious lymph nodes. Esophagus within normal limits. Lungs/Pleura: Emphysema. No pleural effusion or pneumothorax. Residual interstitial and minimal clustered nodularity in the posterior right upper lobe at the site of prior consolidation though improved significantly compared to the chest CT from November. Minimal bronchiectasis also in the region and some bandlike densities suggesting developing post infectious or inflammatory scarring. Upper Abdomen: No acute finding Musculoskeletal: No acute or suspicious osseous abnormality IMPRESSION: 1. Residual interstitial and minimal clustered nodularity in the posterior right upper lobe at the site of prior consolidation though improved significantly compared to the chest CT from November. Minimal bronchiectasis also in the region and some bandlike densities suggesting developing post infectious or inflammatory scarring. Recommend a follow-up chest CT in 3 months to ensure further resolution or document stability. 2. Emphysema. Aortic Atherosclerosis (ICD10-I70.0) and Emphysema (ICD10-J43.9). Electronically Signed   By: Jasmine Pang M.D.   On: 09/30/2023 20:22   DG Chest Portable 1 View Result Date: 09/30/2023 CLINICAL DATA:  Dyspnea on exertion EXAM: PORTABLE CHEST 1 VIEW COMPARISON:  Chest radiograph dated 10/29/2022. CTA chest dated 08/28/2023. FINDINGS: Persistent right upper lobe opacity, possibly improving infection/pneumonia, but underlying lesion/mass is not excluded. Consider CT chest for further evaluation. Left lung is clear.  No pleural effusion or pneumothorax. The heart is normal in size. IMPRESSION: Persistent right upper lobe opacity, possibly improving infection/pneumonia, but underlying lesion/mass is not excluded. Consider CT chest for further evaluation. Electronically Signed   By: Charline Bills M.D.   On: 09/30/2023 16:33   NCV with EMG(electromyography) Result Date: 09/30/2023 Levert Feinstein, MD     09/30/2023   6:03 PM     Full Name: Jamarien Muddiman Gender: Male MRN #: [id number] Date of Birth: [birth year]   Visit Date: 09/30/2023 09:28 Age: 76 Years Examining Physician: Dr. Levert Feinstein Referring Physician: Dr. Levert Feinstein Height: 5 feet 7 inch History: 57 year old male presenting with subacute onset of gait abnormality in October 2024 Summary of the test: Nerve conduction study: Bilateral sural, superficial peroneal sensory responses were within normal limit, with upper limit of peak latency. Left radial sensory responses were normal.  Left ulnar, medial sensory responses showed mildly decreased snap amplitude. Bilateral tibial motor responses were normal at distal stimulation site, with evidence of temporal dispersion at proximal stimulation site.  Bilateral tibial F-wave latency was prolonged. Bilateral peroneal to EDB motor response showed significantly decreased CMAP amplitude. Left ulnar, median motor responses  showed evidence of decreased CMAP amplitude, evidence of temporal dispersion at proximal stimulation site, with upper limit of left ulnar F-wave latency. Electromyography: Selected needle examinations were performed at left lower extremity muscles, lumbosacral paraspinal muscles;, left upper extremity muscles and left cervical paraspinal muscles. There is no evidence of active denervation, but there is evidence of decreased recruitment patterns at all the muscle tested. There is noticeable polyphasic changes at left cervical and lumbar paraspinal muscles. Conclusion: This is an abnormal study.  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  There is no evidence of axonal loss. Levert Feinstein. M.D. Ph.D. [facility] [address], [address] [place], [place] [address] Tel: [phone] Fax: [phone] Verbal informed consent was obtained from the patient, patient was informed of potential risk of procedure, including bruising, bleeding, hematoma formation, infection, muscle  weakness, muscle pain, numbness, among others.     MNC   Nerve / Sites Muscle Latency Ref. Amplitude Ref. Rel Amp Segments Distance Velocity Ref. Area   ms ms mV mV %  cm m/s m/s mVms L Median - APB    Wrist APB 4.0 <=4.4 3.2 >=4.0 100 Wrist - APB 7   8.8    Upper arm APB 8.3  1.7  52 Upper arm - Wrist 20 46 >=49 5.5 L Ulnar - ADM    Wrist ADM 3.0 <=3.3 3.8 >=6.0 100 Wrist - ADM 7   15.1    B.Elbow ADM 4.6  1.2  31 B.Elbow - Wrist 11 69 >=49 3.4    A.Elbow ADM 8.3  2.9  247 A.Elbow - B.Elbow 19 52 >=49 13.3 L Peroneal - EDB    Ankle EDB 7.0 <=6.5 0.9 >=2.0 100 Ankle - EDB 9   3.0    Fib head EDB 14.4  0.5  63.7 Fib head - Ankle 24 32 >=44 1.7    Pop fossa EDB 17.1  0.6  112 Pop fossa - Fib head 12 45 >=44 1.5        Pop fossa - Ankle     R Peroneal - EDB    Ankle EDB 7.5 <=6.5 0.8 >=2.0 100 Ankle - EDB 9   2.2    Fib head EDB 13.3  0.5  64.9 Fib head - Ankle 23.6 40 >=44 2.5    Pop fossa EDB 15.1  0.7  152 Pop fossa - Fib head 10.6 60 >=44 3.4        Pop fossa - Ankle     L Tibial - AH    Ankle AH 4.9 <=5.8 4.1 >=4.0 100 Ankle - AH 9   14.8    Pop fossa AH 13.6  0.8  20.8 Pop fossa - Ankle 37 42 >=41 3.7 R Tibial - AH    Ankle AH 5.2 <=5.8 4.6 >=4.0 100 Ankle - AH 9   21.8    Pop fossa AH 15.7  1.4  30.1 Pop fossa - Ankle 37 35 >=41 4.8               SNC   Nerve / Sites Rec. Site Peak Lat Ref.  Amp Ref. Segments Distance   ms ms V V  cm L Radial - Anatomical snuff box (Forearm)    Forearm Wrist 2.3 <=2.9 16 >=15 Forearm - Wrist 10 L Sural - Ankle (Calf)    Calf Ankle 4.2 <=4.4 8 >=6 Calf - Ankle 14 R Sural - Ankle (Calf)    Calf Ankle 4.4 <=4.4 6 >=6 Calf -  Ankle 14 L Superficial peroneal - Ankle    Lat leg Ankle 4.2 <=4.4 10 >=6 Lat leg - Ankle 14 R Superficial peroneal - Ankle    Lat leg Ankle 4.5 <=4.4 4 >=6 Lat leg - Ankle 14 L Median - Orthodromic (Dig II, Mid palm)    Dig II Wrist 3.1 <=3.4 4 >=10 Dig II - Wrist 13 L Ulnar - Orthodromic, (Dig V, Mid palm)    Dig V Wrist 2.8 <=3.1 3 >=5 Dig V - Wrist 29                  F  Wave   Nerve F Lat Ref.  ms ms L Tibial - AH 60.5 <=56.0 R Tibial - AH 61.0 <=56.0 L Ulnar - ADM 31.3 <=32.0 L Median - APB 31.9 <=31.0           EMG Summary Table   Spontaneous MUAP Recruitment Muscle IA Fib PSW Fasc Other Amp Dur. Poly Pattern L. Tibialis anterior Increased None None None _______ Increased Increased 1+ Reduced L. Peroneus longus Increased None None None _______ Increased Increased 1+ Reduced L. Gastrocnemius (Medial head) Increased None None None _______ Increased Increased 1+ Reduced L. Vastus lateralis Increased None None None _______ Increased Increased 1+ Reduced L. First dorsal interosseous Increased None None None _______ Increased Increased 1+ Reduced L. Extensor digitorum communis Increased None None None _______ Increased Increased 1+ Reduced L. Biceps brachii Increased None None None _______ Increased Increased 1+ Reduced L. Deltoid Increased None None None _______ Increased Increased 1+ Reduced L. Pronator teres Increased None None None _______ Increased Increased 1+ Reduced L. Cervical paraspinals Normal None None None _______ Normal Normal Normal Normal L. Lumbar paraspinals (low) Normal None None None _______ Increased Increased 1+ Normal L. Lumbar paraspinals (mid) Increased None None None _______ Increased Increased 2+ Normal      Labs:   Basic Metabolic Panel: Recent Labs  Lab 09/30/23 1553 09/30/23 1618 10/01/23 0529  NA 132* 128* 133*  K 4.4 4.2 3.6  CL 97*  --  99  CO2 21*  --  27  GLUCOSE 81  --  85  BUN 8  --  11  CREATININE 0.78  --  0.79  CALCIUM 9.7  --  9.0   GFR Estimated Creatinine Clearance: 105 mL/min (by C-G formula based on SCr of 0.79 mg/dL). Liver Function Tests: Recent Labs  Lab 10/01/23 0529  AST 21  ALT 18  ALKPHOS 54  BILITOT 0.8  PROT 7.3  ALBUMIN 3.6   No results for input(s): "LIPASE", "AMYLASE" in the last 168 hours. No results for input(s): "AMMONIA" in the last 168 hours. Coagulation profile No results  for input(s): "INR", "PROTIME" in the last 168 hours.  CBC: Recent Labs  Lab 09/30/23 1553 09/30/23 1618 10/01/23 0529  WBC 10.4  --  6.4  NEUTROABS 4.7  --   --   HGB 15.4 15.0 14.3  HCT 44.7 44.0 41.9  MCV 90.3  --  89.5  PLT 354  --  323   Cardiac Enzymes: No results for input(s): "CKTOTAL", "CKMB", "CKMBINDEX", "TROPONINI" in the last 168 hours. BNP: Invalid input(s): "POCBNP" CBG: No results for input(s): "GLUCAP" in the last 168 hours. D-Dimer No results for input(s): "DDIMER" in the last 72 hours. Hgb A1c No results for input(s): "HGBA1C" in the last 72 hours. Lipid Profile No results for input(s): "CHOL", "HDL", "LDLCALC", "TRIG", "CHOLHDL", "LDLDIRECT" in the last 72 hours. Thyroid function studies No results  for input(s): "TSH", "T4TOTAL", "T3FREE", "THYROIDAB" in the last 72 hours.  Invalid input(s): "FREET3" Anemia work up No results for input(s): "VITAMINB12", "FOLATE", "FERRITIN", "TIBC", "IRON", "RETICCTPCT" in the last 72 hours. Microbiology Recent Results (from the past 240 hours)  Resp panel by RT-PCR (RSV, Flu A&B, Covid) Anterior Nasal Swab     Status: None   Collection Time: 09/30/23  4:40 PM   Specimen: Anterior Nasal Swab  Result Value Ref Range Status   SARS Coronavirus 2 by RT PCR NEGATIVE NEGATIVE Final   Influenza A by PCR NEGATIVE NEGATIVE Final   Influenza B by PCR NEGATIVE NEGATIVE Final    Comment: (NOTE) The Xpert Xpress SARS-CoV-2/FLU/RSV plus assay is intended as an aid in the diagnosis of influenza from Nasopharyngeal swab specimens and should not be used as a sole basis for treatment. Nasal washings and aspirates are unacceptable for Xpert Xpress SARS-CoV-2/FLU/RSV testing.  Fact Sheet for Patients: BloggerCourse.com  Fact Sheet for Healthcare Providers: SeriousBroker.it  This test is not yet approved or cleared by the [place] FDA and has been authorized for detection  and/or diagnosis of SARS-CoV-2 by FDA under an Emergency Use Authorization (EUA). This EUA will remain in effect (meaning this test can be used) for the duration of the COVID-19 declaration under Section 564(b)(1) of the Act, 21 U.S.C. section 360bbb-3(b)(1), unless the authorization is terminated or revoked.     Resp Syncytial Virus by PCR NEGATIVE NEGATIVE Final    Comment: (NOTE) Fact Sheet for Patients: BloggerCourse.com  Fact Sheet for Healthcare Providers: SeriousBroker.it  This test is not yet approved or cleared by the [place] FDA and has been authorized for detection and/or diagnosis of SARS-CoV-2 by FDA under an Emergency Use Authorization (EUA). This EUA will remain in effect (meaning this test can be used) for the duration of the COVID-19 declaration under Section 564(b)(1) of the Act, 21 U.S.C. section 360bbb-3(b)(1), unless the authorization is terminated or revoked.  Performed at [facility], [address], [place], [place] [address]     Time coordinating discharge: 45 minutes  Signed: Callia Swim  [facility] 10/05/2023, 1:31 PM

## 2023-10-05 NOTE — Progress Notes (Signed)
Inpatient Rehab Admissions Coordinator:    I have insurance approval and a bed available for pt to admit to CIR today. Dr. Pola Corn in agreement and [facility] aware.  I met with pt at bedside and he is agreeable to admit today.  Left voicemail for his spouse, Clydie Braun, to update her as well.    Estill Dooms, PT, DPT Admissions Coordinator [phone] 10/05/23  1:29 PM

## 2023-10-05 NOTE — Progress Notes (Signed)
Inpatient Rehab Admissions Coordinator:   Awaiting determination from [facility] regarding CIR prior auth request.  Will continue to follow.   Estill Dooms, PT, DPT Admissions Coordinator [phone] 10/05/23  11:30 AM

## 2023-10-05 NOTE — Progress Notes (Signed)
[name], DO  Physician Physical Medicine and Rehabilitation   PMR Pre-admission    Signed   Date of Service: 10/05/2023 12:45 PM  Related encounter: ED to Hosp-Admission (Current) from 09/30/2023 in [facility]   Signed     Expand All Collapse All  PMR Admission Coordinator Pre-Admission Assessment   Patient: Jonathan Luna is an 57 y.o., male MRN: [id number] DOB: May 15, 1966 Height: 5\' 7"  (170.2 cm) Weight: 83.1 kg                                                                                                                                                  Insurance Information HMO:     PPO:      PCP:      IPA:      80/20:      OTHER:  PRIMARY: [employer]      Policy#: [id number]      Subscriber: pt CM Name: Alona Bene      Phone#: [phone]     Fax#: [phone]  Pre-Cert#: [id number] auth for CIR from Alona Bene with [facility] with updates due to Ruby Cola at fax listed above on 12/27 (phone [phone] ext [phone])      Employer:  Benefits:  Phone #: [phone]     Name:  Eff. Date: 12/12/22     Deduct: $3500 (met)      Out of Pocket Max: $7000 (met)      Life Max: n/a  CIR: 70%      SNF: 70% Outpatient:      Co-Pay: $30/visit Home Health: 70%      Co-Pay: 30% DME: 70%     Co-Pay: 30% Providers:  SECONDARY:       Policy#:       Phone#:    Artist:       Phone#:    The Engineer, materials Information Summary" for patients in Inpatient Rehabilitation Facilities with attached "Privacy Act Statement-Health Care Records" was provided and verbally reviewed with: Patient and Family   Emergency Contact Information Contact Information       Name Relation Home Work Mobile    Carattini,Karen Spouse [phone]   [phone]    Constantinos, Arballo     [phone]         Other Contacts   None on File      Current Medical History  Patient Admitting Diagnosis: CIDP   History of Present Illness: Pt is a 20 y/ male with PMH of HTN, HLD,  chronic pain, neuropathy who admitted to [facility] on 09/30/23 from his neurologist office for 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  Hemodynamically stable in ED.  MRI C/T/L outpatient showed some minor disc degenerative changes but no significant cord compression.  Neurology inpatient consulted and recommended IVIG x5 days (last date 12/22) and close monitoring of  respiratory status/NiF/VC.   On 12-19, patient developed left NLF and mouth asymmetry, neurology concern for possible blood clotting and stroke secondary to IVIG, however MRI brain was performed with no acute findings.  Patient is being maintained on intermittent oxygen with last recorded and if -40 VC 2.5 L.  Therapy evaluations were completed and pt was recommended for CIR.    Patient's medical record from [facility] has been reviewed by the rehabilitation admission coordinator and physician.   Past Medical History      Past Medical History:  Diagnosis Date   Allergy     Anxiety     Arthritis     Cataract     Community acquired pneumonia 08/28/2023   Depression     Detached retina     Heart murmur     History of bilateral cataract extraction     Hyperlipidemia     Hypothyroidism     Low back pain     Panic attack     Panic attacks            Has the patient had major surgery during 100 days prior to admission? No   Family History  family history includes Asthma in his father; Breast cancer in his mother; Diabetes in his brother; Emphysema in his father; Heart disease in his father; Hyperlipidemia in his brother; Hypertension in his father.     Current Medications   Current Medications    Current Facility-Administered Medications:    acetaminophen (TYLENOL) tablet 650 mg, 650 mg, Oral, Q6H PRN **OR** acetaminophen (TYLENOL) suppository 650 mg, 650 mg, Rectal, Q6H PRN, Synetta Fail, MD   acetaminophen (TYLENOL) tablet 1,000 mg, 1,000 mg, Oral, Once, Synetta Fail, MD   clonazePAM Scarlette Calico) tablet 0.5 mg, 0.5 mg, Oral,  BID PRN, Marinda Elk, MD, 0.5 mg at 10/04/23 2144   enoxaparin (LOVENOX) injection 40 mg, 40 mg, Subcutaneous, Q24H, Synetta Fail, MD, 40 mg at 10/04/23 2357   gabapentin (NEURONTIN) capsule 100 mg, 100 mg, Oral, BID, Marinda Elk, MD, 100 mg at 10/05/23 1610   hydrocortisone cream 0.5 %, , Topical, Daily PRN, Marinda Elk, MD   irbesartan Evlyn Kanner) tablet 300 mg, 300 mg, Oral, Daily, Marinda Elk, MD, 300 mg at 10/05/23 9604   levothyroxine (SYNTHROID) tablet 125 mcg, 125 mcg, Oral, Q0600, Synetta Fail, MD, 125 mcg at 10/05/23 0524   metoprolol succinate (TOPROL-XL) 24 hr tablet 25 mg, 25 mg, Oral, Daily, Marinda Elk, MD, 25 mg at 10/05/23 0834   oxyCODONE-acetaminophen (PERCOCET/ROXICET) 5-325 MG per tablet 1 tablet, 1 tablet, Oral, Q8H PRN, Synetta Fail, MD, 1 tablet at 10/05/23 0525   polyethylene glycol (MIRALAX / GLYCOLAX) packet 17 g, 17 g, Oral, Daily PRN, Synetta Fail, MD   sertraline (ZOLOFT) tablet 25 mg, 25 mg, Oral, Daily, Synetta Fail, MD, 25 mg at 10/05/23 5409   sodium chloride flush (NS) 0.9 % injection 3 mL, 3 mL, Intravenous, Q12H, Synetta Fail, MD, 3 mL at 10/05/23 8119     Patients Current Diet:  Diet Order                  Diet regular Room service appropriate? Yes; Fluid consistency: Thin  Diet effective now                         Precautions / Restrictions Precautions Precautions: Fall Restrictions Weight Bearing Restrictions Per Provider Order: No  Has the patient had 2 or more falls or a fall with injury in the past year?Yes   Prior Activity Level Community (5-7x/wk): independent prior to illness in October, driving, no DME   Prior Functional Level Prior Function Prior Level of Function : Independent/Modified Independent, History of Falls (last six months) Mobility Comments: ambulating with RW.5 falls in the last 3 weeks (without the RW); no fall since RW use ADLs  Comments: mod I, wife assists with IADLs   Self Care: Did the patient need help bathing, dressing, using the toilet or eating?  Independent   Indoor Mobility: Did the patient need assistance with walking from room to room (with or without device)? Independent   Stairs: Did the patient need assistance with internal or external stairs (with or without device)? Independent   Functional Cognition: Did the patient need help planning regular tasks such as shopping or remembering to take medications? Independent   Patient Information Are you of Hispanic, Latino/a,or Spanish origin?: A. No, not of Hispanic, Latino/a, or Spanish origin What is your race?: A. White Do you need or want an interpreter to communicate with a doctor or health care staff?: 0. No   Patient's Response To:  Health Literacy and Transportation Is the patient able to respond to health literacy and transportation needs?: Yes Health Literacy - How often do you need to have someone help you when you read instructions, pamphlets, or other written material from your doctor or pharmacy?: Never In the past 12 months, has lack of transportation kept you from medical appointments or from getting medications?: No In the past 12 months, has lack of transportation kept you from meetings, work, or from getting things needed for daily living?: No   Home Assistive Devices / Equipment Home Equipment: Grab bars - tub/shower, Rollator (4 wheels), Wheelchair - manual   Prior Device Use: Indicate devices/aids used by the patient prior to current illness, exacerbation or injury?  Immediately PTA using a RW but does not typically use   Current Functional Level Cognition   Overall Cognitive Status: Impaired/Different from baseline Orientation Level: Oriented X4 General Comments: pt perseverating on having IVIG treatment, RN states he finished 5th dose last night and confirmed with pharmacy    Extremity Assessment (includes  Sensation/Coordination)   Upper Extremity Assessment: LUE deficits/detail, RUE deficits/detail RUE Deficits / Details: parathesias and "stiffness." WFL for basic functional tasks LUE Deficits / Details: parathesias and "stiffness." ROM is WFL, strength 4/5  Lower Extremity Assessment: Defer to PT evaluation RLE Deficits / Details: incoordination, foot slap with higher steppage gait pattern.  general weakness. RLE Sensation: history of peripheral neuropathy RLE Coordination: decreased fine motor LLE Deficits / Details: Less incoordination, general weakness than R LE LLE Sensation: history of peripheral neuropathy LLE Coordination: decreased fine motor     ADLs   Overall ADL's : Needs assistance/impaired Eating/Feeding: Independent Grooming: Contact guard assist, Standing Upper Body Bathing: Set up, Sitting Lower Body Bathing: Contact guard assist, Sit to/from stand Upper Body Dressing : Set up, Sitting Lower Body Dressing: Supervision/safety, Sitting/lateral leans Lower Body Dressing Details (indicate cue type and reason): pt. able to provide supported figure 4 to himself and don/doff socks with other available hand. increased time and rest breaks required, would benefit from A/E demo next session Toilet Transfer: Contact guard assist, Ambulation, Rolling walker (2 wheels) Toileting- Clothing Manipulation and Hygiene: Contact guard assist, Sit to/from stand Functional mobility during ADLs: Contact guard assist, Rolling walker (2 wheels) General ADL Comments: pt. reports  prior to home he kept all medications in their original bottle, reports that now his greatest concern is not being able to open the bottle caps.  reviewed having a pill organizer that someone could assist filling so he would only have to pop open or lift the tap to access the needed pills.  able to manage BLE dressing but required increased effort. could benefit from A/E next session     Mobility   Overal bed mobility: Needs  Assistance Bed Mobility: Supine to Sit Supine to sit: Supervision Sit to supine: Supervision General bed mobility comments: in recliner for session     Transfers   Overall transfer level: Needs assistance Equipment used: Rolling walker (2 wheels) Transfers: Sit to/from Stand Sit to Stand: Contact guard assist General transfer comment: cues for safety/hand placement, completed x8     Ambulation / Gait / Stairs / Wheelchair Mobility   Ambulation/Gait Ambulation/Gait assistance: Min assist, Mod assist Gait Distance (Feet): 175 Feet (+ 100) Assistive device: Rolling walker (2 wheels) Gait Pattern/deviations: Step-through pattern, Knees buckling, Shuffle, Decreased dorsiflexion - right, Decreased dorsiflexion - left General Gait Details: pt with progressive instability and knee buckling with faituge. poor heel strike and foot slap noted bilaterally. Gait velocity: decreased Gait velocity interpretation: <1.8 ft/sec, indicate of risk for recurrent falls     Posture / Balance Balance Overall balance assessment: Needs assistance Sitting-balance support: No upper extremity supported Sitting balance-Leahy Scale: Good Standing balance support: Single extremity supported, Bilateral upper extremity supported Standing balance-Leahy Scale: Poor Standing balance comment: dependent on BUE support for dynamic balance     Special needs/care consideration N/a        Previous Home Environment (from acute therapy documentation) Living Arrangements: Spouse/significant other Available Help at Discharge: Family Type of Home: House Home Layout: One level Home Access: Stairs to enter Entrance Stairs-Rails: Right Entrance Stairs-Number of Steps: 1 Bathroom Shower/Tub: Engineer, manufacturing systems: Standard   Discharge Living Setting Plans for Discharge Living Setting: Patient's home, Lives with (comment) (spouse) Type of Home at Discharge: House Discharge Home Layout: One level Discharge  Home Access: Stairs to enter Entrance Stairs-Rails: None Entrance Stairs-Number of Steps: 1 Discharge Bathroom Shower/Tub: Tub/shower unit Discharge Bathroom Toilet: Standard Discharge Bathroom Accessibility: Yes How Accessible: Accessible via walker Does the patient have any problems obtaining your medications?: No   Social/Family/Support Systems Patient Roles: Spouse Anticipated Caregiver: mod I goals, spouse and brother support Anticipated Caregiver's Contact Information: Clydie Braun [phone]; Trey Paula [phone] Caregiver Availability: 24/7 Discharge Plan Discussed with Primary Caregiver: Yes Is Caregiver In Agreement with Plan?: Yes Does Caregiver/Family have Issues with Lodging/Transportation while Pt is in Rehab?: No     Goals Patient/Family Goal for Rehab: PT/OT supervision to mod I Expected length of stay: 10-14 days Additional Information: Discharge plan: return to patient's home, mod I level.  Has a spouse (works), and brother.  Brother may be able to provide some limited supervision when spouse is at work. Pt/Family Agrees to Admission and willing to participate: Yes Program Orientation Provided & Reviewed with Pt/Caregiver Including Roles  & Responsibilities: Yes     Decrease burden of Care through IP rehab admission: n/a     Possible need for SNF placement upon discharge: Not anticipated.  Plan for discharge home with support from spouse and brother.      Patient Condition: This patient's condition remains as documented in the consult dated 10/02/23, in which the Rehabilitation Physician determined and documented that the patient's condition is appropriate for intensive rehabilitative  care in an inpatient rehabilitation facility. Will admit to inpatient rehab today.   Preadmission Screen Completed By:  Stephania Fragmin, PT, DPT 10/05/2023 1:23 PM ______________________________________________________________________   Discussed status with Dr. Shearon Stalls on 10/05/23 at   1:23 PM  and received approval for admission today.   Admission Coordinator:  Stephania Fragmin, PT, DPT time 1:23 PM Dorna Bloom 10/05/23             Revision History

## 2023-10-05 NOTE — Plan of Care (Signed)

## 2023-10-05 NOTE — Progress Notes (Signed)
Occupational Therapy Treatment Patient Details Name: Jonathan Luna MRN: [id number] DOB: May 04, 1966 Today's Date: 10/05/2023   History of present illness Pt is a 57 y/o male admitted 12/18 with an worsening gait abnormality and ascending paralysis.  MRI results pending.  IVIG started.  MD suspecting GBS.  PMHx  HTN, HLD, chronic pain, neuropathy, gait abnormality.   OT comments  Pt. Seen for skilled OT treatment session.  Focus of session was LB dressing task.  Pt. Able to provide supported figure four positioning and manage Jonathan/doff socks. BLEs.  Rest breaks and compensatory strategies provided.  Will bring A/E next session for pt. To try.  Pt. Remains motivated and eager to progress.  Will benefit from >3hrs/day intense therapies prior to home.        If plan is discharge home, recommend the following:  A little help with walking and/or transfers;A little help with bathing/dressing/bathroom;Assistance with cooking/housework;Assistance with feeding;Direct supervision/assist for medications management;Direct supervision/assist for financial management;Assist for transportation;Help with stairs or ramp for entrance   Equipment Recommendations       Recommendations for Other Services Rehab consult    Precautions / Restrictions Precautions Precautions: Fall Restrictions Weight Bearing Restrictions Per Provider Order: No       Mobility Bed Mobility               General bed mobility comments: in recliner for session    Transfers                         Balance                                           ADL either performed or assessed with clinical judgement   ADL Overall ADL's : Needs assistance/impaired                     Lower Body Dressing: Supervision/safety;Sitting/lateral leans Lower Body Dressing Details (indicate cue type and reason): pt. able to provide supported figure 4 to himself and Jonathan/doff socks with other available hand.  increased time and rest breaks required, would benefit from A/E demo next session               General ADL Comments: pt. reports prior to home he kept all medications in their original bottle, reports that now his greatest concern is not being able to open the bottle caps.  reviewed having a pill organizer that someone could assist filling so he would only have to pop open or lift the tap to access the needed pills.  able to manage BLE dressing but required increased effort. could benefit from A/E next session    Extremity/Trunk Assessment              Vision       Perception     Praxis      Cognition Arousal: Alert Behavior During Therapy: WFL for tasks assessed/performed Overall Cognitive Status: Impaired/Different from baseline                       Memory: Decreased short-term memory                  Exercises      Shoulder Instructions       General Comments      Pertinent Vitals/ Pain  Pain Assessment Pain Assessment: Faces Faces Pain Scale: Hurts a little bit Pain Location: chronic back pain, BLEs Pain Descriptors / Indicators: Grimacing Pain Intervention(s): Repositioned, Monitored during session, Limited activity within patient's tolerance  Home Living                                          Prior Functioning/Environment              Frequency  Min 1X/week        Progress Toward Goals  OT Goals(current goals can now be found in the care plan section)  Progress towards OT goals: Progressing toward goals     Plan      Co-evaluation                 AM-PAC OT "6 Clicks" Daily Activity     Outcome Measure   Help from another person eating meals?: None Help from another person taking care of personal grooming?: A Little Help from another person toileting, which includes using toliet, bedpan, or urinal?: A Little Help from another person bathing (including washing, rinsing, drying)?: A  Little Help from another person to put on and taking off regular upper body clothing?: A Little Help from another person to put on and taking off regular lower body clothing?: A Little 6 Click Score: 19    End of Session    OT Visit Diagnosis: Unsteadiness on feet (R26.81)   Activity Tolerance Patient tolerated treatment well   Patient Left in chair;with call bell/phone within reach   Nurse Communication Other (comment) (notified rn chair alarm needed for pt. as he reports he has gotten up a few times without assistance for use of b.room)        Time: 4132-4401 OT Time Calculation (min): 14 min  Charges: OT General Charges $OT Visit: 1 Visit OT Treatments $Self Care/Home Management : 8-22 mins  Boneta Lucks, COTA/L Acute Rehabilitation [phone]   Alessandra Bevels Lorraine-COTA/L 10/05/2023, 1:12 PM

## 2023-10-05 NOTE — Progress Notes (Signed)
NIF -40 

## 2023-10-05 NOTE — H&P (Signed)
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 He was readmitted 11/15-11/17/24 for sepsis due to PNA. He was evaluated by Dr. 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 He was sent to hospital for treatment and starated on IVIG the same day.  NIF -40 with VC 4.46 initially-->-40/2.5 L on next check. He was noted to have respiratory alkalosis with low BP and indapamide and irbesartan held.  On 12/19, he was noted to have "left facial weakness concerning for possible blood clotting SE as well as new oxygen requirement" and potential stroke and MRI brain ordered which was negative.    IVIG resumed and he has completed 5 day course on 12/22.  NIF/VC being monitored when patient willing and last check @ -40/2.5 L. He has had gradual improvement in strength but not back to baseline. Notes indicate plans for LP X 2 on 12/31 at [facility] neurology? BP trending back up and hyponatremia stable. Therapy has been working with patient who requires mod to max assist with tendency for knees to buckle and shuffling gait. CIR recommended due to functional decline.                Review of Systems  Constitutional:  Negative for chills and fever.  HENT:  Negative for hearing loss.   Eyes:  Positive for double vision (if he tries to focus with left).  Respiratory:  Negative for cough and shortness of breath.   Cardiovascular:  Negative for chest pain and palpitations.  Gastrointestinal:  Negative for abdominal pain, constipation and heartburn.   Genitourinary:  Negative for dysuria and urgency.       Gets up 1-2 times at night  Musculoskeletal:  Positive for back pain and myalgias.  Skin:  Negative for rash.  Neurological:  Positive for sensory change and weakness.  Psychiatric/Behavioral:  The patient is nervous/anxious and has insomnia (in hospital).             Past Medical History:  Diagnosis Date   Allergy     Anxiety     Arthritis     Cataract     Community acquired pneumonia 08/28/2023   Depression     Detached retina     Heart murmur     History of bilateral cataract extraction     Hyperlipidemia     Hypothyroidism     Low back pain     Panic attack     Panic attacks                 Past Surgical History:  Procedure Laterality Date   CATARACT EXTRACTION       EYE SURGERY                   Family History  Problem Relation Age of Onset   Breast cancer Mother     Hypertension Father     Heart disease Father     Asthma Father     Emphysema Father  smoker for 45 years   Diabetes Brother     Hyperlipidemia Brother            Social History:  reports that he has never smoked. He has been exposed to tobacco smoke. He has never used smokeless tobacco. He reports that he does not currently use alcohol. He reports that he does not use drugs.     Allergies       Allergies  Allergen Reactions   Amoxicillin Other (See Comments)      Pt states that it does not work for him when he had back to back pneumonia   Effexor [Venlafaxine] Diarrhea   Fluoxetine Hcl     Prozac [Fluoxetine Hcl] Diarrhea              Medications Prior to Admission  Medication Sig Dispense Refill   aspirin EC 81 MG tablet Take 81 mg by mouth daily. Swallow whole.       Cholecalciferol (VITAMIN D-3) 25 MCG (1000 UT) CAPS Take 1 capsule by mouth daily.       clonazePAM (KLONOPIN) 0.5 MG tablet Take 1 tablet (0.5 mg total) by mouth 2 (two) times daily as needed for anxiety. 60 tablet 1   fluticasone (FLONASE) 50  MCG/ACT nasal spray Place 2 sprays into both nostrils daily. 48 g 1   gabapentin (NEURONTIN) 300 MG capsule Take 2 capsules (600 mg total) by mouth 3 (three) times daily. 180 capsule 11   indapamide (LOZOL) 1.25 MG tablet Take 1 tablet (1.25 mg total) by mouth daily. (Patient taking differently: Take 1.25 mg by mouth in the morning. Take with Irbesartan) 90 tablet 0   irbesartan (AVAPRO) 300 MG tablet Take 1 tablet (300 mg total) by mouth daily. (Patient taking differently: Take 300 mg by mouth in the morning. Take with Indapamide) 90 tablet 0   oxyCODONE-acetaminophen (PERCOCET/ROXICET) 5-325 MG tablet Take 1 tablet by mouth every 8 (eight) hours as needed for severe pain (pain score 7-10). 90 tablet 0   sertraline (ZOLOFT) 25 MG tablet Take 1 tablet (25 mg total) by mouth daily. 30 tablet 0   UNITHROID 125 MCG tablet Take 1 tablet (125 mcg total) by mouth daily before breakfast. (Patient taking differently: Take 125 mcg by mouth at bedtime.) 90 tablet 0   metoprolol succinate (TOPROL XL) 25 MG 24 hr tablet Take 1 tablet (25 mg total) by mouth daily. (Patient not taking: Reported on 09/30/2023) 90 tablet 0          Home: Home Living Family/patient expects to be discharged to:: Private residence Living Arrangements: Spouse/significant other Available Help at Discharge: Family Type of Home: House Home Access: Stairs to enter Secretary/administrator of Steps: 1 Entrance Stairs-Rails: Right Home Layout: One level Bathroom Shower/Tub: Engineer, manufacturing systems: Standard Home Equipment: Grab bars - tub/shower, Rollator (4 wheels), Wheelchair - manual   Functional History: Prior Function Prior Level of Function : Independent/Modified Independent, History of Falls (last six months) Mobility Comments: ambulating with RW.5 falls in the last 3 weeks (without the RW); no fall since RW use ADLs Comments: mod I, wife assists with IADLs   Functional Status:  Mobility: Bed Mobility Overal  bed mobility: Needs Assistance Bed Mobility: Supine to Sit Supine to sit: Supervision Sit to supine: Supervision General bed mobility comments: pt OOB at start and end of session Transfers Overall transfer level: Needs assistance Equipment used: Rolling walker (2 wheels) Transfers: Sit to/from Stand Sit to Stand: Contact guard assist General transfer  comment: cues for safety/hand placement, completed x8 Ambulation/Gait Ambulation/Gait assistance: Min assist, Mod assist Gait Distance (Feet): 175 Feet (+ 100) Assistive device: Rolling walker (2 wheels) Gait Pattern/deviations: Step-through pattern, Knees buckling, Shuffle, Decreased dorsiflexion - right, Decreased dorsiflexion - left General Gait Details: pt with progressive instability and knee buckling with faituge. poor heel strike and foot slap noted bilaterally. Gait velocity: decreased Gait velocity interpretation: <1.8 ft/sec, indicate of risk for recurrent falls   ADL: ADL Overall ADL's : Needs assistance/impaired Eating/Feeding: Independent Grooming: Contact guard assist, Standing Upper Body Bathing: Set up, Sitting Lower Body Bathing: Contact guard assist, Sit to/from stand Upper Body Dressing : Set up, Sitting Lower Body Dressing: Supervision/safety, Sitting/lateral leans Lower Body Dressing Details (indicate cue type and reason): pt. able to provide supported figure 4 to himself and don/doff socks with other available hand. increased time and rest breaks required Toilet Transfer: Contact guard assist, Ambulation, Rolling walker (2 wheels) Toileting- Clothing Manipulation and Hygiene: Contact guard assist, Sit to/from stand Functional mobility during ADLs: Contact guard assist, Rolling walker (2 wheels) General ADL Comments: CGA for safety, mild LOBs noted   Cognition: Cognition Overall Cognitive Status: Impaired/Different from baseline Orientation Level: Oriented X4 Cognition Arousal: Alert Behavior During Therapy:  WFL for tasks assessed/performed Overall Cognitive Status: Impaired/Different from baseline Area of Impairment: Memory Memory: Decreased short-term memory General Comments: pt perseverating on having IVIG treatment, RN states he finished 5th dose last night and confirmed with pharmacy     Blood pressure (!) 136/95, pulse 95, temperature 97.8 F (36.6 C), temperature source Oral, resp. rate 17, height 5\' 7"  (1.702 m), weight 83.1 kg, SpO2 100%. Physical Exam   Constitutional: No apparent distress. Appropriate appearance for age.  Sitting in bedside chair. HENT: No JVD. Neck Supple. Trachea midline.  Mild left facial droop.  Eyes: Left lens subluxation.  Right exotropia. EOMI grossly intact.  Center visual field loss in right eye. + Glasses Cardiovascular: RRR, no murmurs/rub/gallops. No Edema. Peripheral pulses 2+  Respiratory: CTAB. No rales, rhonchi, or wheezing. On RA.  Can carry on normal conversation without excessive shortness of breath. Abdomen: + bowel sounds, normoactive. No distention or tenderness.  Skin: C/D/I. No apparent lesions.  Peripheral IVs intact. MSK:      No apparent deformity.  ?  Mild left upper extremity drop arm, needs to hike shoulder to initiate.      Bilateral second and third digits with decreased range of motion in finger flexion, extension.  No palpable cyst, contracture, or trigger finger.  Neurologic exam:  Cognition: AAO to person, place, time and event.  Language: Fluent, No substitutions or neoglisms. No dysarthria. Names 3/3 objects correctly.  Memory: Recalls 3/3 objects at 5 minutes. No apparent deficits  Insight: Good  insight into current condition.  Mood: Pleasant affect, appropriate mood.  Sensation: To light touch reduced on the plantar surfaces of bilateral feet Reflexes: Flaccid throughout bilateral lower extremities, 1+ biceps. Negative Hoffman's and babinski signs bilaterally.  CN: Mild left facial droop as above, otherwise  intact. Coordination: Bilateral upper and lower extremity intention tremors Spasticity: MAS 0 in all extremities.  Strength:                RUE: 5/5 SA, 5/5 EF, 5/5 EE, 4/5 WE, 3/5 FA, 4/5 FF                LUE:  5/5 SA, 5/5 EF, 5/5 EE, 4/5 WE, 3/5 FA, 4/5 FF  RLE: 5/5 HF, 5/5 KE, 4/5  DF, 1/5  EHL, 4/5  PF                 LLE:  5/5 HF, 5/5 KE, 4/5  DF, 1/5  EHL, 4/5  PF      Lab Results Last 48 Hours  No results found for this or any previous visit (from the past 48 hours).   Imaging Results (Last 48 hours)  No results found.         Blood pressure (!) 136/95, pulse 95, temperature 97.8 F (36.6 C), temperature source Oral, resp. rate 17, height 5\' 7"  (1.702 m), weight 83.1 kg, SpO2 100%.   Medical Problem List and Plan: 1. 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              - Stable to admit to CIR  2.  Antithrombotics: -DVT/anticoagulation:  Pharmaceutical: Lovenox             -antiplatelet therapy: N/A  3.Chronic LBP/Pain Management: Continue oxycodone prn-->managed by PCP             --on Gabapentin 100 mg BID for neuropathy -> Per discussion with patient on admission, ongoing hypersensitivity on bottom of feet, discussed gabapentin adjustment vs. Lyrica, opted to switch to gabapentin 300 mg nightly.  4. Mood/Behavior/Sleep: Stable on Zoloft with low dose klonopin prn. LCSW to follow for evaluation and support.              -antipsychotic agents: N/A             --has been  using Klonopin for sleep  5. Neuropsych/cognition: This patient is capable of making decisions on his own behalf. 6. Skin/Wound Care: Routine pressure relief measures.  7. Fluids/Electrolytes/Nutrition: Monitor I/O. Check CMET in am 8. HTN: Monitor BP TID--continue  metoprolol and irbesartan 9. Hyponatremia: Multifactorial and likely reactive, IVIG-->recheck in am 10. H/o anxiety disorder: Off Buspar as was ineffective. Continue Klonopin bid prn 11. H/o Depression: Managed with low dose Zoloft.  12. Hypothyroid: Continue synthroid 125 mc daily for supplement.        Jacquelynn Cree, PA-C 10/05/2023  I have examined the patient independently and edited the note for HPI, ROS, exam, assessment, and plan as appropriate. I am in agreement with the above recommendations.   [name], DO 10/05/2023

## 2023-10-05 NOTE — Discharge Instructions (Signed)
General discharge instructions: Follow with Primary MD [name], MD in 7 days  Please request your PCP  to go over your hospital tests, procedures, radiology results at the follow up. Please get your medicines reviewed and adjusted.  Your PCP may decide to repeat certain labs or tests as needed. Do not drive, operate heavy machinery, perform activities at heights, swimming or participation in water activities or provide baby sitting services if your were admitted for syncope or siezures until you have seen by Primary MD or a Neurologist and advised to do so again. [place] Controlled Substance Reporting System database was reviewed. Do not drive, operate heavy machinery, perform activities at heights, swim, participate in water activities or provide baby-sitting services while on medications for pain, sleep and mood until your outpatient physician has reevaluated you and advised to do so again.  You are strongly recommended to comply with the dose, frequency and duration of prescribed medications. Activity: As tolerated with Full fall precautions use walker/cane & assistance as needed Avoid using any recreational substances like cigarette, tobacco, alcohol, or non-prescribed drug. If you experience worsening of your admission symptoms, develop shortness of breath, life threatening emergency, suicidal or homicidal thoughts you must seek medical attention immediately by calling 911 or calling your MD immediately  if symptoms less severe. You must read complete instructions/literature along with all the possible adverse reactions/side effects for all the medicines you take and that have been prescribed to you. Take any new medicine only after you have completely understood and accepted all the possible adverse reactions/side effects.  Wear Seat belts while driving. You were cared for by a hospitalist during your hospital stay. If you have any questions about your discharge medications or the care  you received while you were in the hospital after you are discharged, you can call the unit and ask to speak with the hospitalist or the covering physician. Once you are discharged, your primary care physician will handle any further medical issues. Please note that NO REFILLS for any discharge medications will be authorized once you are discharged, as it is imperative that you return to your primary care physician (or establish a relationship with a primary care physician if you do not have one).

## 2023-10-05 NOTE — Progress Notes (Signed)
PROGRESS NOTE  Jonathan Luna  DOB: [birth year]  PCP: [name], MD ZOX:[id number]  DOA: 09/30/2023  LOS: 5 days  Hospital Day: 6  Brief narrative: Jonathan Luna is a 57 y.o. male with PMH significant for HTN, HLD, anxiety/depression, arthritis, hypothyroidism, neuropathy impaired gait 12/18, patient presented to the ED with complaint of progressively worsening gait abnormality. Of note, patient was treated for pneumonia in October.  In November, he was admitted for sepsis secondary to pneumonia and treated with antibiotics. During that time, he started to notice some weakness in the lower legs with his legs buckling at times when he walks.  He also noted numbness starting in his feet and ascending upwards as well as some numbness in the palms of his hands.  His symptoms progressed to a point where his fingers are weak even to hold a cup.  Denies any difficulty swallowing.  Able to hold a conversation without running out of breath. With the above symptoms, he was seen at neurologist office.  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 Seen by neurology Admitted to [facility]  Subjective: Patient was seen and examined this morning.  Pleasant middle-aged Caucasian male.  Not in distress Chart reviewed In the last 24 hours, afebrile, heart rate in 60s and 70s, blood pressure 130s and 140s, breathing on room air. Last set of labs from 12/19 unremarkable  Assessment and plan: CIDP (chronic inflammatory demyelinating polyneuropathy) MRI of the L-spine showed degenerative disease most prominent L4-L5. Neurology recommended 5 days of antibiotics which was completed on 10/04/2023. Continue negative inspiratory force and vital capacity every 12 hours. PT OT recommended inpatient rehab  Hyponatremia Sodium level low on last checks.  Repeat labs tomorrow. Recent Labs  Lab 09/30/23 1553 09/30/23 1618 10/01/23 0529  NA 132* 128* 133*    Essential hypertension Currently  blood pressure controlled on Toprol 25 mg daily and irbesartan 300 mg daily.  Blood pressure seems to be stable.   Hypothyroidism Continue Synthroid.   Chronic pain Continue oxycodone.   Anxiety Continue sertraline.   Mobility: Encourage ambulation.  Goals of care   Code Status: Full Code     DVT prophylaxis:  enoxaparin (LOVENOX) injection 40 mg Start: 09/30/23 2000   Antimicrobials: None Fluid: None Consultants: None currently Family Communication: None at bedside  Status: Inpatient Level of care:  Med-Surg   Patient is from: Home Needs to continue in-hospital care: Pending CIR    Diet:  Diet Order             Diet regular Room service appropriate? Yes; Fluid consistency: Thin  Diet effective now                   Scheduled Meds:  acetaminophen  1,000 mg Oral Once   enoxaparin (LOVENOX) injection  40 mg Subcutaneous Q24H   gabapentin  100 mg Oral BID   irbesartan  300 mg Oral Daily   levothyroxine  125 mcg Oral Q0600   metoprolol succinate  25 mg Oral Daily   sertraline  25 mg Oral Daily   sodium chloride flush  3 mL Intravenous Q12H    PRN meds: acetaminophen **OR** acetaminophen, clonazePAM, hydrocortisone cream, oxyCODONE-acetaminophen, polyethylene glycol   Infusions:    Antimicrobials: Anti-infectives (From admission, onward)    None       Objective: Vitals:   10/05/23 0400 10/05/23 0805  BP: (!) 144/94 (!) 136/95  Pulse: 79 95  Resp:  19 17  Temp: 97.7 F (36.5 C) 97.8 F (36.6 C)  SpO2: 97% 100%    Intake/Output Summary (Last 24 hours) at 10/05/2023 1316 Last data filed at 10/05/2023 0500 Gross per 24 hour  Intake 830 ml  Output 1275 ml  Net -445 ml   Filed Weights   09/30/23 1357 10/01/23 0410  Weight: 83.9 kg 83.1 kg   Weight change:  Body mass index is 28.69 kg/m.   Physical Exam: General exam: Pleasant, middle-aged Caucasian male.  Not in distress Skin: No rashes, lesions or ulcers. HEENT: Atraumatic,  normocephalic, no obvious bleeding Lungs: Clear to auscultation bilaterally CVS: S1, S2, no murmur GI/Abd: Soft, nontender, nondistended, bowel sound present CNS: Alert, awake, oriented x 3 Psychiatry: Mood appropriate Extremities: No pedal edema, no calf tenderness  Data Review: I have personally reviewed the laboratory data and studies available.  F/u labs ordered Unresulted Labs (From admission, onward)     Start     Ordered   10/07/23 0500  Creatinine, serum  (enoxaparin (LOVENOX)    CrCl >/= 30 ml/min)  Weekly,   R     Comments: while on enoxaparin therapy    09/30/23 1745   10/06/23 0500  Basic metabolic panel  Tomorrow morning,   R       Question:  Specimen collection method  Answer:  Lab=Lab collect   10/05/23 1315   10/06/23 0500  CBC with Differential/Platelet  Tomorrow morning,   R       Question:  Specimen collection method  Answer:  Lab=Lab collect   10/05/23 1315            Total time spent in review of labs and imaging, patient evaluation, formulation of plan, documentation and communication with family: 45 minutes  Signed, Lorin Glass, MD [facility] 10/05/2023

## 2023-10-05 NOTE — Progress Notes (Signed)
Jonathan Sheriff, DO  Physician Physical Medicine and Rehabilitation   Consult Note    Signed   Date of Service: 10/02/2023 12:38 PM  Related encounter: ED to Hosp-Admission (Current) from 09/30/2023 in [facility]   Signed     Expand All Collapse All           Physical Medicine and Rehabilitation Consult Reason for Consult: Evaluate appropriateness for Inpatient Rehab Referring Physician: Dr. David Stall       HPI: Jonathan Luna is a 57 y.o. male with PMHx of  has a past medical history of Allergy, Anxiety, Arthritis, Cataract, Community acquired pneumonia (08/28/2023), Depression, Detached retina, Heart murmur, History of bilateral cataract extraction, Hyperlipidemia, Hypothyroidism, Low back pain, Panic attack, and Panic attacks. . 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  Patient had already had MRI L-spine showing degenerative changes most prominent at L4-5 prior.  On 12-19, patient developed left NLF and mouth asymmetry, neurology concern for possible blood clotting and stroke secondary to IVIG, however MRI brain was performed with no acute findings.  Patient is being maintained on intermittent oxygen with last recorded and if -40 VC 2.5 L, although he did refuse his last NIF.  PM&R was consulted to evaluate appropriateness for IPR admission.    Per chart review, patient lives in his private residence with his spouse with a single-story layout, one-step to enter.  He is independent with a rolling walker at baseline and modified independent for ADLs, wife assists with some IADLs.  Notable, prior to his initial  illness, he was independent without any ambulatory devices.     Review of Systems  Constitutional:  Negative for chills and fever.  Respiratory:  Negative for cough, hemoptysis and shortness of breath.   Cardiovascular:  Negative for chest pain and palpitations.  Gastrointestinal:  Negative for constipation, diarrhea, nausea and vomiting.  Genitourinary:  Negative for dysuria and urgency.  Musculoskeletal:  Positive for myalgias.  Neurological:  Positive for tingling, tremors, sensory change and focal weakness. Negative for dizziness, loss of consciousness and headaches.  Psychiatric/Behavioral:  Negative for depression. The patient is not nervous/anxious and does not have insomnia.         Past Medical History:  Diagnosis Date   Allergy     Anxiety     Arthritis     Cataract     Community acquired pneumonia 08/28/2023   Depression     Detached retina     Heart murmur     History of bilateral cataract extraction     Hyperlipidemia     Hypothyroidism     Low back pain     Panic attack     Panic attacks               Past Surgical History:  Procedure Laterality Date   CATARACT EXTRACTION       EYE SURGERY                 Family History  Problem Relation Age of Onset   Breast cancer Mother     Hypertension Father     Heart disease Father     Asthma Father  Emphysema Father          smoker for 45 years   Diabetes Brother     Hyperlipidemia Brother          Social History:  reports that he has never smoked. He has been exposed to tobacco smoke. He has never used smokeless tobacco. He reports that he does not currently use alcohol. He reports that he does not use drugs. Allergies:  Allergies       Allergies  Allergen Reactions   Amoxicillin Other (See Comments)      Pt states that it does not work for him when he had back to back pneumonia   Effexor [Venlafaxine] Diarrhea   Fluoxetine Hcl     Prozac [Fluoxetine Hcl] Diarrhea            Medications  Prior to Admission  Medication Sig Dispense Refill   aspirin EC 81 MG tablet Take 81 mg by mouth daily. Swallow whole.       Cholecalciferol (VITAMIN D-3) 25 MCG (1000 UT) CAPS Take 1 capsule by mouth daily.       clonazePAM (KLONOPIN) 0.5 MG tablet Take 1 tablet (0.5 mg total) by mouth 2 (two) times daily as needed for anxiety. 60 tablet 1   fluticasone (FLONASE) 50 MCG/ACT nasal spray Place 2 sprays into both nostrils daily. 48 g 1   gabapentin (NEURONTIN) 300 MG capsule Take 2 capsules (600 mg total) by mouth 3 (three) times daily. 180 capsule 11   indapamide (LOZOL) 1.25 MG tablet Take 1 tablet (1.25 mg total) by mouth daily. (Patient taking differently: Take 1.25 mg by mouth in the morning. Take with Irbesartan) 90 tablet 0   irbesartan (AVAPRO) 300 MG tablet Take 1 tablet (300 mg total) by mouth daily. (Patient taking differently: Take 300 mg by mouth in the morning. Take with Indapamide) 90 tablet 0   oxyCODONE-acetaminophen (PERCOCET/ROXICET) 5-325 MG tablet Take 1 tablet by mouth every 8 (eight) hours as needed for severe pain (pain score 7-10). 90 tablet 0   sertraline (ZOLOFT) 25 MG tablet Take 1 tablet (25 mg total) by mouth daily. 30 tablet 0   UNITHROID 125 MCG tablet Take 1 tablet (125 mcg total) by mouth daily before breakfast. (Patient taking differently: Take 125 mcg by mouth at bedtime.) 90 tablet 0   metoprolol succinate (TOPROL XL) 25 MG 24 hr tablet Take 1 tablet (25 mg total) by mouth daily. (Patient not taking: Reported on 09/30/2023) 90 tablet 0          Home: Home Living Family/patient expects to be discharged to:: Private residence Living Arrangements: Spouse/significant other Available Help at Discharge: Family Type of Home: House Home Access: Stairs to enter Secretary/administrator of Steps: 1 Home Layout: One level Bathroom Shower/Tub: Engineer, manufacturing systems: Standard Home Equipment: Grab bars - tub/shower, Rollator (4 wheels), Wheelchair - manual   Functional History: Prior Function Prior Level of Function : Independent/Modified Independent, History of Falls (last six months) Mobility Comments: ambulating with RW.5 falls in the last 3 weeks (without the RW); no fall since RW use ADLs Comments: mod I, wife assists with IADLs Functional Status:  Mobility: Bed Mobility Overal bed mobility: Needs Assistance Bed Mobility: Supine to Sit, Sit to Supine Supine to sit: Supervision Sit to supine: Supervision General bed mobility comments: OOB in the chair on arrival Transfers Overall transfer level: Needs assistance Transfers: Sit to/from Stand Sit to Stand: Contact guard assist General transfer comment: cues for safety/hand placement Ambulation/Gait  Ambulation/Gait assistance: Min assist, Mod assist (mod as he fatigue and began to be tremulous.) Gait Distance (Feet): 150 Feet (100 additional feet) Assistive device: Rolling walker (2 wheels) Gait Pattern/deviations: Step-through pattern General Gait Details: mildly unsteady, progressing to tremulous and less steady.  R steppage gait with foot slap.  Cues for posture and proximity to the RW--improved stability immediately until fatigue. Gait velocity interpretation: <1.8 ft/sec, indicate of risk for recurrent falls   ADL: ADL Overall ADL's : Needs assistance/impaired Eating/Feeding: Independent Grooming: Contact guard assist, Standing Upper Body Bathing: Set up, Sitting Lower Body Bathing: Contact guard assist, Sit to/from stand Upper Body Dressing : Set up, Sitting Lower Body Dressing: Sit to/from stand, Minimal assistance Toilet Transfer: Contact guard assist, Ambulation, Rolling walker (2 wheels) Toileting- Clothing Manipulation and Hygiene: Contact guard assist, Sit to/from stand Functional mobility during ADLs: Contact guard assist, Rolling walker (2 wheels) General ADL Comments: CGA for safety, mild LOBs noted   Cognition: Cognition Overall Cognitive Status: No  family/caregiver present to determine baseline cognitive functioning Orientation Level: Oriented X4 Cognition Arousal: Alert Behavior During Therapy: WFL for tasks assessed/performed Overall Cognitive Status: No family/caregiver present to determine baseline cognitive functioning General Comments: basic orientation WFL limited insight and attention noted. would benefit from higher level assessment   Blood pressure (!) 155/81, pulse 92, temperature 97.7 F (36.5 C), temperature source Oral, resp. rate 20, height 5\' 7"  (1.702 m), weight 83.1 kg, SpO2 95%. Physical Exam   Constitutional: No apparent distress. Appropriate appearance for age.  HENT: No JVD. Neck Supple. Trachea midline.  Mild left facial droop. Eyes: Left lens subluxation.  Right exotropia.  Difficulty with fixation and convergence, EOMI grossly intact.  Center visual field loss in right eye. Cardiovascular: RRR, no murmurs/rub/gallops. No Edema. Peripheral pulses 2+  Respiratory: CTAB. No rales, rhonchi, or wheezing. On RA.  Can carry on normal conversation without excessive shortness of breath. Abdomen: + bowel sounds, normoactive. No distention or tenderness.  Skin: C/D/I. No apparent lesions.  Peripheral IVs intact. MSK:      No apparent deformity.       Neurologic exam:  Cognition: AAO to person, place, time and event.  Language: Fluent, No substitutions or neoglisms. No dysarthria. Names 3/3 objects correctly.  Memory: Recalls 3/3 objects at 5 minutes. No apparent deficits  Insight: Good  insight into current condition.  Mood: Pleasant affect, appropriate mood.  Sensation: To light touch reduced on the plantar surfaces of bilateral feet Reflexes: Flaccid throughout bilateral lower extremities, 1+ biceps. Negative Hoffman's and babinski signs bilaterally.  CN: Mild left facial droop as above Coordination: Bilateral upper and lower extremity intention tremors Spasticity: MAS 0 in all extremities.  Strength:                 RUE: 5/5 SA, 5/5 EF, 5/5 EE, 4/5 WE, 3/5 FA, 4/5 FF                LUE:  5/5 SA, 5/5 EF, 5/5 EE, 4/5 WE, 3/5 FA, 4/5 FF                RLE: 5/5 HF, 4/5 KE, 4/5  DF, 1/5  EHL, 4/5  PF                 LLE:  5/5 HF, 5/5 KE, 4/5  DF, 1/5  EHL, 4/5  PF          Lab Results Last 24 Hours  No results found for this or  any previous visit (from the past 24 hours).    Imaging Results (Last 48 hours)  MR BRAIN WO CONTRAST Result Date: 10/01/2023 CLINICAL DATA:  Sepsis EXAM: MRI HEAD WITHOUT CONTRAST TECHNIQUE: Multiplanar, multiecho pulse sequences of the brain and surrounding structures were obtained without intravenous contrast. COMPARISON:  09/23/2023 FINDINGS: Brain: No acute infarct, mass effect or extra-axial collection. No acute or chronic hemorrhage. Normal white matter signal, parenchymal volume and CSF spaces. The midline structures are normal. Vascular: Normal flow voids. Skull and upper cervical spine: Normal calvarium and skull base. Visualized upper cervical spine and soft tissues are normal. Sinuses/Orbits:No paranasal sinus fluid levels or advanced mucosal thickening. No mastoid or middle ear effusion. Normal orbits. IMPRESSION: Normal brain MRI. Electronically Signed   By: Deatra Robinson M.D.   On: 10/01/2023 19:42    CT CHEST WO CONTRAST Result Date: 09/30/2023 CLINICAL DATA:  Respiratory illness abnormal chest x-ray EXAM: CT CHEST WITHOUT CONTRAST TECHNIQUE: Multidetector CT imaging of the chest was performed following the standard protocol without IV contrast. RADIATION DOSE REDUCTION: This exam was performed according to the departmental dose-optimization program which includes automated exposure control, adjustment of the mA and/or kV according to patient size and/or use of iterative reconstruction technique. COMPARISON:  09/30/2023, CT chest 08/28/2023 FINDINGS: Cardiovascular: Limited assessment without intravenous contrast. Nonaneurysmal aorta. Normal cardiac size. Mild coronary  vascular calcification. Trace pericardial effusion Mediastinum/Nodes: Patent trachea. No thyroid mass. No suspicious lymph nodes. Esophagus within normal limits. Lungs/Pleura: Emphysema. No pleural effusion or pneumothorax. Residual interstitial and minimal clustered nodularity in the posterior right upper lobe at the site of prior consolidation though improved significantly compared to the chest CT from November. Minimal bronchiectasis also in the region and some bandlike densities suggesting developing post infectious or inflammatory scarring. Upper Abdomen: No acute finding Musculoskeletal: No acute or suspicious osseous abnormality IMPRESSION: 1. Residual interstitial and minimal clustered nodularity in the posterior right upper lobe at the site of prior consolidation though improved significantly compared to the chest CT from November. Minimal bronchiectasis also in the region and some bandlike densities suggesting developing post infectious or inflammatory scarring. Recommend a follow-up chest CT in 3 months to ensure further resolution or document stability. 2. Emphysema. Aortic Atherosclerosis (ICD10-I70.0) and Emphysema (ICD10-J43.9). Electronically Signed   By: Jasmine Pang M.D.   On: 09/30/2023 20:22    DG Chest Portable 1 View Result Date: 09/30/2023 CLINICAL DATA:  Dyspnea on exertion EXAM: PORTABLE CHEST 1 VIEW COMPARISON:  Chest radiograph dated 10/29/2022. CTA chest dated 08/28/2023. FINDINGS: Persistent right upper lobe opacity, possibly improving infection/pneumonia, but underlying lesion/mass is not excluded. Consider CT chest for further evaluation. Left lung is clear.  No pleural effusion or pneumothorax. The heart is normal in size. IMPRESSION: Persistent right upper lobe opacity, possibly improving infection/pneumonia, but underlying lesion/mass is not excluded. Consider CT chest for further evaluation. Electronically Signed   By: Charline Bills M.D.   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 Yes Does the patient require coordinated care of a physician, rehab nurse, therapy disciplines of PT, OT to address physical and functional deficits in the context of the above medical diagnosis(es)? Yes Addressing deficits in the following areas: balance, endurance, locomotion, strength, transferring, bathing, dressing, feeding, grooming, and toileting Can the patient actively participate in an intensive therapy program of at least 3 hrs of therapy per day at least 5 days per week? Yes The potential for patient to make measurable gains while on inpatient rehab is good Anticipated functional outcomes upon discharge from inpatient rehab are modified independent  with PT, modified independent with OT Estimated rehab length of stay to reach the above functional goals is: 10-14 days Anticipated discharge destination: Home Overall Rehab/Functional Prognosis: good   POST ACUTE RECOMMENDATIONS: This patient's condition is appropriate for continued rehabilitative care in the following setting: CIR Patient has agreed to participate in recommended program. Yes Note that insurance prior authorization may be required for reimbursement for recommended care.   Comment: Mr. Wahle is an excellent  candidate for inpatient rehab.  He presents to the hospital with diagnosis of acute on chronic inflammatory demyelinating neuropathy, and has been making excellent functional gains since initiation of IVIG.  He remains with some medical complications of intermittent respiratory difficulty and neuropathic pain, complicated by chronic low back pain, as well as hypertension and hyponatremia.  He has an excellent dispo plan with a single story home, and wife who can provide support.  He is motivated to participate in inpatient rehab, and is anticipated to make good gains with an intensive therapy program.   MEDICAL RECOMMENDATIONS: Consider low-dose gabapentin for neuropathic pains Consider baclofen as needed for ongoing spasms in hands and feet     I have personally performed a face to face diagnostic evaluation of this patient. Additionally, I have examined the patient's medical record including any pertinent labs and radiographic images. If the physician assistant has documented in this note, I have reviewed and edited or otherwise concur with the physician assistant's documentation.   Thanks,   Jonathan Sheriff, DO 10/02/2023           Routing History

## 2023-10-06 ENCOUNTER — Ambulatory Visit (HOSPITAL_COMMUNITY)
Admission: RE | Admit: 2023-10-06 | Payer: 59 | Source: Ambulatory Visit | Admitting: Physical Medicine and Rehabilitation

## 2023-10-06 DIAGNOSIS — G610 Guillain-Barre syndrome: Secondary | ICD-10-CM | POA: Diagnosis not present

## 2023-10-06 LAB — COMPREHENSIVE METABOLIC PANEL
ALT: 40 U/L (ref 0–44)
AST: 42 U/L — ABNORMAL HIGH (ref 15–41)
Albumin: 3.4 g/dL — ABNORMAL LOW (ref 3.5–5.0)
Alkaline Phosphatase: 48 U/L (ref 38–126)
Anion gap: 10 (ref 5–15)
BUN: 9 mg/dL (ref 6–20)
CO2: 24 mmol/L (ref 22–32)
Calcium: 9.1 mg/dL (ref 8.9–10.3)
Chloride: 108 mmol/L (ref 98–111)
Creatinine, Ser: 0.68 mg/dL (ref 0.61–1.24)
GFR, Estimated: >60 mL/min (ref >60)
Glucose, Bld: 93 mg/dL (ref 70–99)
Potassium: 3.9 mmol/L (ref 3.5–5.1)
Sodium: 142 mmol/L (ref 135–145)
Total Bilirubin: 0.6 mg/dL (ref <1.2)
Total Protein: 7.7 g/dL (ref 6.5–8.1)

## 2023-10-06 LAB — CBC WITH DIFFERENTIAL/PLATELET
Abs Immature Granulocytes: 0.01 10*3/uL (ref 0.00–0.07)
Basophils Absolute: 0.1 10*3/uL (ref 0.0–0.1)
Basophils Relative: 2 %
Eosinophils Absolute: 0.5 10*3/uL (ref 0.0–0.5)
Eosinophils Relative: 8 %
HCT: 40.6 % (ref 39.0–52.0)
Hemoglobin: 13.6 g/dL (ref 13.0–17.0)
Immature Granulocytes: 0 %
Lymphocytes Relative: 44 %
Lymphs Abs: 2.6 10*3/uL (ref 0.7–4.0)
MCH: 30.4 pg (ref 26.0–34.0)
MCHC: 33.5 g/dL (ref 30.0–36.0)
MCV: 90.8 fL (ref 80.0–100.0)
Monocytes Absolute: 0.7 10*3/uL (ref 0.1–1.0)
Monocytes Relative: 12 %
Neutro Abs: 2 10*3/uL (ref 1.7–7.7)
Neutrophils Relative %: 34 %
Platelets: 332 10*3/uL (ref 150–400)
RBC: 4.47 MIL/uL (ref 4.22–5.81)
RDW: 13.2 % (ref 11.5–15.5)
WBC: 5.8 10*3/uL (ref 4.0–10.5)
nRBC: 0 % (ref 0.0–0.2)

## 2023-10-06 MED ORDER — ALBUTEROL SULFATE (2.5 MG/3ML) 0.083% IN NEBU
2.5000 mg | INHALATION_SOLUTION | RESPIRATORY_TRACT | Status: DC | PRN
  Administered 2023-10-14 – 2023-10-20 (×5): 2.5 mg via RESPIRATORY_TRACT
  Filled 2023-10-06 (×5): qty 3

## 2023-10-06 MED ORDER — GABAPENTIN 400 MG PO CAPS
400.0000 mg | ORAL_CAPSULE | Freq: Three times a day (TID) | ORAL | Status: DC
  Administered 2023-10-06 – 2023-10-07 (×4): 400 mg via ORAL
  Filled 2023-10-06 (×4): qty 1

## 2023-10-06 MED ORDER — METHOCARBAMOL 500 MG PO TABS
500.0000 mg | ORAL_TABLET | Freq: Four times a day (QID) | ORAL | Status: DC | PRN
  Administered 2023-10-06 – 2023-10-10 (×3): 500 mg via ORAL
  Filled 2023-10-06 (×3): qty 1

## 2023-10-06 MED ORDER — BENZONATATE 100 MG PO CAPS
100.0000 mg | ORAL_CAPSULE | Freq: Three times a day (TID) | ORAL | Status: AC
  Administered 2023-10-06 – 2023-10-10 (×15): 100 mg via ORAL
  Filled 2023-10-06 (×15): qty 1

## 2023-10-06 MED ORDER — ASPIRIN 81 MG PO CHEW
81.0000 mg | CHEWABLE_TABLET | Freq: Every day | ORAL | Status: DC
Start: 2023-10-06 — End: 2023-10-20
  Administered 2023-10-06 – 2023-10-20 (×15): 81 mg via ORAL
  Filled 2023-10-06 (×15): qty 1

## 2023-10-06 MED ORDER — GUAIFENESIN ER 600 MG PO TB12
600.0000 mg | ORAL_TABLET | Freq: Two times a day (BID) | ORAL | Status: DC
  Administered 2023-10-06 – 2023-10-20 (×29): 600 mg via ORAL
  Filled 2023-10-06 (×29): qty 1

## 2023-10-06 NOTE — Evaluation (Signed)
Physical Therapy Assessment and Plan  Patient Details  Name: Jonathan Luna MRN: [id number] Date of Birth: Feb 24, 1966  PT Diagnosis: Ataxia, Ataxic gait, Coordination disorder, Difficulty walking, Muscle spasms, and Muscle weakness Rehab Potential: Good ELOS: 2 weeks   Today's Date: 10/06/2023 PT Individual Time: 1027-1200 PT Individual Time Calculation (min): 93 min    Hospital Problem: Principal Problem:   Acute inflammatory demyelinating polyneuropathy (HCC)   Past Medical History:  Past Medical History:  Diagnosis Date   Allergy    Anxiety    Arthritis    Cataract    Community acquired pneumonia 08/28/2023   Depression    Detached retina    Heart murmur    History of bilateral cataract extraction    Hyperlipidemia    Hypothyroidism    Low back pain    Panic attack    Panic attacks    Past Surgical History:  Past Surgical History:  Procedure Laterality Date   CATARACT EXTRACTION     EYE SURGERY      Assessment & Plan Clinical Impression: Patient is a 57 y.o. year old male with history of HTN, chronic pain, depression, panic attacks, juvenile cataracts w/ minimal vision right, hypothyroid, PNA 07/2023 followed by onset of numbness, tingling, burning in feet/toes progressing to bilateral hands by the end of the month and progressed to gait disorder. He was readmitted 11/15-11/17/24 for sepsis due to PNA. He was evaluated by Dr. 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 He was sent to hospital for treatment and starated on IVIG the same day.  NIF -40 with VC 4.46 initially-->-40/2.5 L on next check. He was noted to have respiratory alkalosis with low BP and indapamide and irbesartan held.  On 12/19, he was noted to have "left facial weakness concerning for possible blood clotting SE as well as new oxygen requirement" and potential stroke and MRI brain ordered which was negative.    IVIG resumed and he has completed 5 day course on 12/22.  NIF/VC  being monitored when patient willing and last check @ -40/2.5 L. He has had gradual improvement in strength but not back to baseline. Notes indicate plans for LP X 2 on 12/31 at [facility] neurology? BP trending back up and hyponatremia stable. Therapy has been working with patient who requires mod to max assist with tendency for knees to buckle and shuffling gait. CIR recommended due to functional decline.  Patient transferred to CIR on 10/05/2023 .   Patient currently requires min with mobility secondary to muscle weakness and muscle joint tightness, decreased cardiorespiratoy endurance, unbalanced muscle activation, ataxia, and decreased coordination, and decreased standing balance, decreased postural control, and decreased balance strategies.  Prior to hospitalization, patient was independent  with mobility and lived with Spouse in a House home.  Home access is 1Stairs to enter.  Patient will benefit from skilled PT intervention to maximize safe functional mobility, minimize fall risk, and decrease caregiver burden for planned discharge home with intermittent assist.  Anticipate patient will benefit from follow up OP at discharge.  PT - End of Session Activity Tolerance: Tolerates 30+ min activity with multiple rests Endurance Deficit: Yes Endurance Deficit Description: decreased PT Assessment Rehab Potential (ACUTE/IP ONLY): Good PT Barriers to Discharge: Decreased caregiver support PT Barriers to Discharge Comments: house, 1 lvl with 1 STE no rails PT Patient demonstrates impairments in the following area(s): Balance;Endurance;Pain;Sensory;Motor;Perception PT Transfers Functional Problem(s): Bed Mobility;Bed to Chair;Car PT Locomotion Functional Problem(s): Ambulation PT Plan PT Intensity: Minimum of 1-2  x/day ,45 to 90 minutes PT Frequency: 5 out of 7 days PT Duration Estimated Length of Stay: 2 weeks PT Treatment/Interventions: Ambulation/gait training;Balance/vestibular training;Community  reintegration;Discharge planning;Disease management/prevention;DME/adaptive equipment instruction;Functional mobility training;Neuromuscular re-education;Pain management;Patient/family education;Psychosocial support;Splinting/orthotics;Stair training;Therapeutic Activities;Therapeutic Exercise;UE/LE Strength taining/ROM;UE/LE Coordination activities;Wheelchair propulsion/positioning;Visual/perceptual remediation/compensation PT Transfers Anticipated Outcome(s): Mod I PT Locomotion Anticipated Outcome(s): Mod I PT Recommendation Recommendations for Other Services: None Follow Up Recommendations: Outpatient PT Patient destination: Home Equipment Recommended: To be determined Equipment Details: owns RW   PT Evaluation Precautions/Restrictions Precautions Precautions: Fall Restrictions Weight Bearing Restrictions Per Provider Order: No General   Vital SignsTherapy Vitals Temp Source: Oral Pain Pain Assessment Pain Scale: 0-10 Pain Score: 6  Pain Location: Back Pain Interference Pain Interference Pain Effect on Sleep: 2. Occasionally Pain Interference with Therapy Activities: 2. Occasionally Pain Interference with Day-to-Day Activities: 2. Occasionally Home Living/Prior Functioning Home Living Available Help at Discharge: Family Type of Home: House Home Access: Stairs to enter Secretary/administrator of Steps: 1 Entrance Stairs-Rails: None Home Layout: One level Bathroom Shower/Tub: Engineer, manufacturing systems: Standard Bathroom Accessibility: Yes  Lives With: Spouse Prior Function Level of Independence: Independent with basic ADLs;Independent with homemaking with ambulation;Independent with transfers;Independent with gait  Able to Take Stairs?: Yes Driving: Yes Vocation: Full time employment Leisure: Hobbies-yes (Comment) Vision/Perception  Vision - History Ability to See in Adequate Light: 0 Adequate Perception Perception: Within Functional Limits Praxis Praxis:  WFL  Cognition Overall Cognitive Status: Within Functional Limits for tasks assessed Arousal/Alertness: Awake/alert Orientation Level: Oriented X4 Memory: Appears intact Awareness: Appears intact Problem Solving: Appears intact Safety/Judgment: Appears intact Sensation Sensation Light Touch: Impaired by gross assessment Hot/Cold: Appears Intact Proprioception: Impaired by gross assessment Stereognosis: Not tested Additional Comments: numbness, tingling, burning bilateral feet Coordination Gross Motor Movements are Fluid and Coordinated: No Fine Motor Movements are Fluid and Coordinated: No Coordination and Movement Description: ataxia Finger Nose Finger Test: dysmetria, intention tremor Heel Shin Test: WFL Motor  Motor Motor: Ataxia Motor - Skilled Clinical Observations: ataxia   Trunk/Postural Assessment  Cervical Assessment Cervical Assessment: Within Functional Limits Thoracic Assessment Thoracic Assessment: Within Functional Limits Lumbar Assessment Lumbar Assessment: Within Functional Limits Postural Control Postural Control: Deficits on evaluation Trunk Control: ataxic Protective Responses: delayed  Balance Balance Balance Assessed: Yes Standardized Balance Assessment Standardized Balance Assessment: Berg Balance Test Berg Balance Test Sit to Stand: Able to stand  independently using hands Standing Unsupported: Able to stand 2 minutes with supervision Sitting with Back Unsupported but Feet Supported on Floor or Stool: Able to sit safely and securely 2 minutes Stand to Sit: Sits safely with minimal use of hands Transfers: Able to transfer with verbal cueing and /or supervision Standing Unsupported with Eyes Closed: Able to stand 10 seconds with supervision Standing Ubsupported with Feet Together: Able to place feet together independently and stand for 1 minute with supervision From Standing, Reach Forward with Outstretched Arm: Loses balance while  trying/requires external support From Standing Position, Pick up Object from Floor: Unable to try/needs assist to keep balance From Standing Position, Turn to Look Behind Over each Shoulder: Needs supervision when turning Turn 360 Degrees: Needs close supervision or verbal cueing Standing Unsupported, Alternately Place Feet on Step/Stool: Able to complete >2 steps/needs minimal assist Standing Unsupported, One Foot in Front: Loses balance while stepping or standing Standing on One Leg: Unable to try or needs assist to prevent fall Total Score: 25 Static Sitting Balance Static Sitting - Balance Support: Feet supported Static Sitting - Level of Assistance: 5: Stand  by assistance (supervision) Dynamic Sitting Balance Dynamic Sitting - Balance Support: Feet supported;During functional activity Dynamic Sitting - Level of Assistance: 5: Stand by assistance (supervision) Static Standing Balance Static Standing - Balance Support: Bilateral upper extremity supported;During functional activity Static Standing - Level of Assistance: 4: Min assist Dynamic Standing Balance Dynamic Standing - Balance Support: During functional activity;Bilateral upper extremity supported Dynamic Standing - Level of Assistance: 4: Min assist Dynamic Standing - Balance Activities: Reaching for objects;Forward lean/weight shifting;Reaching across midline Extremity Assessment  RUE Assessment RUE Assessment: Exceptions to [facility] Active Range of Motion (AROM) Comments: WFL General Strength Comments: general weakness, slight ataxia, overall 4/5 LUE Assessment LUE Assessment: Exceptions to [facility] Active Range of Motion (AROM) Comments: WFL General Strength Comments: ataxia noted, worse in LUE, overall 4-/5 RLE Assessment RLE Assessment: Within Functional Limits General Strength Comments: grossly 4/5 except ankle DF 2/5 LLE Assessment LLE Assessment: Exceptions to [facility] General Strength Comments: grossly 4-/5 except ankle  dorsiflexion 2/5  Care Tool Care Tool Bed Mobility Roll left and right activity   Roll left and right assist level: Supervision/Verbal cueing    Sit to lying activity   Sit to lying assist level: Supervision/Verbal cueing    Lying to sitting on side of bed activity   Lying to sitting on side of bed assist level: the ability to move from lying on the back to sitting on the side of the bed with no back support.: Supervision/Verbal cueing     Care Tool Transfers Sit to stand transfer   Sit to stand assist level: Contact Guard/Touching assist    Chair/bed transfer   Chair/bed transfer assist level: Minimal Assistance - Patient > 75%    Car transfer Car transfer activity did not occur: Safety/medical concerns (fatigue)        Care Tool Locomotion Ambulation   Assist level: Minimal Assistance - Patient > 75% Assistive device: Walker-rolling Max distance: 150 ft  Walk 10 feet activity   Assist level: Minimal Assistance - Patient > 75% Assistive device: Walker-rolling   Walk 50 feet with 2 turns activity   Assist level: Minimal Assistance - Patient > 75% Assistive device: Walker-rolling  Walk 150 feet activity   Assist level: Minimal Assistance - Patient > 75% Assistive device: Walker-rolling  Walk 10 feet on uneven surfaces activity Walk 10 feet on uneven surfaces activity did not occur: Safety/medical concerns (fatigue)      Stairs   Assist level: Minimal Assistance - Patient > 75% Stairs assistive device: 2 hand rails    Walk up/down 1 step activity   Walk up/down 1 step (curb) assist level: Minimal Assistance - Patient > 75% Walk up/down 1 step or curb assistive device: 2 hand rails  Walk up/down 4 steps activity   Walk up/down 4 steps assist level: Minimal Assistance - Patient > 75% Walk up/down 4 steps assistive device: 2 hand rails  Walk up/down 12 steps activity Walk up/down 12 steps activity did not occur: Safety/medical concerns (fatigue)      Pick up small  objects from floor Pick up small object from the floor (from standing position) activity did not occur: Safety/medical concerns (imbalance)      Wheelchair Is the patient using a wheelchair?: No Type of Wheelchair: Manual   Wheelchair assist level: Dependent - Patient 0% Max wheelchair distance: 200 ft  Wheel 50 feet with 2 turns activity   Assist Level: Dependent - Patient 0%  Wheel 150 feet activity   Assist Level: Dependent - Patient 0%  Refer to Care Plan for Long Term Goals  SHORT TERM GOAL WEEK 1 PT Short Term Goal 1 (Week 1): Pt will require CGA with bed<>chair transfer with LRAD PT Short Term Goal 2 (Week 1): Pt will require CGA with gait >100 ft with LRAD PT Short Term Goal 3 (Week 1): Pt will require CGA with navigation of steps x 4  Recommendations for other services: None   Skilled Therapeutic Intervention Mobility Bed Mobility Bed Mobility: Rolling Right;Rolling Left;Supine to Sit;Sit to Supine Rolling Right: Supervision/verbal cueing Rolling Left: Supervision/Verbal cueing Supine to Sit: Supervision/Verbal cueing Sit to Supine: Supervision/Verbal cueing Transfers Transfers: Sit to Stand;Stand to Sit;Stand Pivot Transfers;Transfer Sit to Stand: Contact Guard/Touching assist Stand to Sit: Contact Guard/Touching assist Stand Pivot Transfers: Minimal Assistance - Patient > 75% Stand Pivot Transfer Details: Visual cues for safe use of DME/AE Stand Pivot Transfer Details (indicate cue type and reason): slight ataxia noted during gait Transfer (Assistive device): Rolling walker Locomotion  Gait Ambulation: Yes Gait Assistance: Minimal Assistance - Patient > 75% Gait Distance (Feet): 150 Feet (feet) Assistive device: Rolling walker Gait Gait: Yes Gait Pattern: Impaired Gait Pattern: Ataxic;Lateral hip instability Gait velocity: decreased Stairs / Additional Locomotion Stairs: Yes Stairs Assistance: Minimal Assistance - Patient > 75% Stair Management  Technique: Two rails Number of Stairs: 4 Height of Stairs: 6 (inches) Wheelchair Mobility Wheelchair Mobility: No   Pt agreeable to PT evaluation and treatment, oriented to [employer] See above for details regarding mobility, pt largely requires supervision bed mobility, CGA/min A for transfers with RW, min gait ~150 ft and stair navigation x 4. Pt limited 2/2 ataxia and generalized muscle weakness, presents with intermittent knee buckling and overcompensates with hyperextension during episodes. Plan to address ataxia with weighted exercises and improve strength to increase functional independence with mobility.   Discharge Criteria: Patient will be discharged from PT if patient refuses treatment 3 consecutive times without medical reason, if treatment goals not met, if there is a change in medical status, if patient makes no progress towards goals or if patient is discharged from hospital.  The above assessment, treatment plan, treatment alternatives and goals were discussed and mutually agreed upon: by patient  Priscille Kluver PT, DPT  10/06/2023, 12:19 PM

## 2023-10-06 NOTE — Progress Notes (Signed)
PROGRESS NOTE   Subjective/Complaints:   Pt reports has had a cough for last 2 days- feels like in chest- said someone told him he was wheezing some.  Also cough is irritating.   LBM 2x yesterday.  Feet BURNING- got an Rx for gabapentin 300 mg last night in addition to 300 mg at bedtime which was ordered- helped a lot, but wore off ~ 5-6am.   Also per nursing, having intermittent muscle spasms in calf- will order Robaxin as needed  Didn't eat breakfast- got something brought in for him.     ROS:  Pt denies SOB, abd pain, CP, N/V/C/D, and vision changes   Negative except for HPI Objective:   No results found. Recent Labs    10/06/23 0456  WBC 5.8  HGB 13.6  HCT 40.6  PLT 332   Recent Labs    10/06/23 0456  NA 142  K 3.9  CL 108  CO2 24  GLUCOSE 93  BUN 9  CREATININE 0.68  CALCIUM 9.1    Intake/Output Summary (Last 24 hours) at 10/06/2023 0855 Last data filed at 10/05/2023 1835 Gross per 24 hour  Intake 240 ml  Output --  Net 240 ml        Physical Exam: Vital Signs Blood pressure (!) 147/92, pulse 71, temperature 97.9 F (36.6 C), resp. rate 18, height 5\' 7"  (1.702 m), weight 83.5 kg, SpO2 97%.    General: awake, alert, appropriate, supine in bed; nurse in room for part of discussion; NAD HENT: conjugate gaze; oropharynx moist CV: regular rate and rhythm; no JVD Pulmonary: Has some wheezing mainly on R side of lungs- slightly decreased at bases B/L-  GI: soft, NT, ND, (+)BS- n; interactive Neurological: Ox3 MSK:      No apparent deformity.  ?  Mild left upper extremity drop arm, needs to hike shoulder to initiate.      Bilateral second and third digits with decreased range of motion in finger flexion, extension.  No palpable cyst, contracture, or trigger finger.   Neurologic exam:  Cognition: AAO to person, place, time and event.  Language: Fluent, No substitutions or neoglisms. No  dysarthria. Names 3/3 objects correctly.  Memory: Recalls 3/3 objects at 5 minutes. No apparent deficits  Insight: Good  insight into current condition.  Mood: Pleasant affect, appropriate mood.  Sensation: To light touch reduced on the plantar surfaces of bilateral feet Reflexes: Flaccid throughout bilateral lower extremities, 1+ biceps. Negative Hoffman's and babinski signs bilaterally.  CN: Mild left facial droop as above, otherwise intact. Coordination: Bilateral upper and lower extremity intention tremors Spasticity: MAS 0 in all extremities.  Strength:                RUE: 5/5 SA, 5/5 EF, 5/5 EE, 4/5 WE, 3/5 FA, 4/5 FF                LUE:  5/5 SA, 5/5 EF, 5/5 EE, 4/5 WE, 3/5 FA, 4/5 FF                RLE: 5/5 HF, 5/5 KE, 4/5  DF, 1/5  EHL, 4/5  PF  LLE:  5/5 HF, 5/5 KE, 4/5  DF, 1/5  EHL, 4/5  PF       Assessment/Plan: 1. Functional deficits which require 3+ hours per day of interdisciplinary therapy in a comprehensive inpatient rehab setting. Physiatrist is providing close team supervision and 24 hour management of active medical problems listed below. Physiatrist and rehab team continue to assess barriers to discharge/monitor patient progress toward functional and medical goals  Care Tool:  Bathing              Bathing assist       Upper Body Dressing/Undressing Upper body dressing        Upper body assist      Lower Body Dressing/Undressing Lower body dressing            Lower body assist       Toileting Toileting    Toileting assist       Transfers Chair/bed transfer  Transfers assist           Locomotion Ambulation   Ambulation assist              Walk 10 feet activity   Assist           Walk 50 feet activity   Assist           Walk 150 feet activity   Assist           Walk 10 feet on uneven surface  activity   Assist           Wheelchair     Assist                Wheelchair 50 feet with 2 turns activity    Assist            Wheelchair 150 feet activity     Assist          Blood pressure (!) 147/92, pulse 71, temperature 97.9 F (36.6 C), resp. rate 18, height 5\' 7"  (1.702 m), weight 83.5 kg, SpO2 97%.  Medical Problem List and Plan: 1. 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              - Stable to admit to CIR   Con't CIR PT and OT- first day of evaluations Team conference today to determine length of stay   2.  Antithrombotics: -DVT/anticoagulation:  Pharmaceutical: Lovenox             -antiplatelet therapy: N/A   3.Chronic LBP/Pain Management: Continue oxycodone prn-->managed by PCP             --on Gabapentin 100 mg BID for neuropathy -> Per discussion with patient on admission, ongoing hypersensitivity on bottom of feet, discussed gabapentin adjustment vs. Lyrica, opted to switch to gabapentin  300 mg nightly.   12/24- will change Gabapentin to 400 mg TID for now- can titrate up more if needed; will also add Robaxin 500 mg q6 hours prn for muscle spasms 4. Mood/Behavior/Sleep: Stable on Zoloft with low dose klonopin prn. LCSW to follow for evaluation and support.              -antipsychotic agents: N/A             --has been using Klonopin for sleep   5. Neuropsych/cognition: This patient is capable of making decisions on his own behalf. 6. Skin/Wound Care: Routine pressure relief measures.  7. Fluids/Electrolytes/Nutrition: Monitor  I/O. Check CMET in am 8. HTN: Monitor BP TID--continue  metoprolol and irbesartan  12/24- BP controlled- con't regimen 9. Hyponatremia: Multifactorial and likely reactive, IVIG-->recheck in am  12/24- Na up to 142, likely since off IVIG 10. H/o anxiety disorder: Off Buspar as was ineffective. Continue Klonopin bid prn 11. H/o Depression: Managed with low dose Zoloft.  12. Hypothyroid: Continue synthroid 125 mc daily for supplement.   13. Cough/wheezing  12/24- no elevated WBC; Afebrile- will treat Sx's- with Mucinex 600 mg BID, Tessalon pearls scheduled 100 mg TID for 5 days and Albuterol inhaler, per pt request, not nebs-    I spent a total of 50   minutes on total care today- >50% coordination of care- due to  D/w pt 2 different times- d/w nursing and reading notes, therapies, and labs, vitals and also team conference to determine length of stay    LOS: 1 days A FACE TO FACE EVALUATION WAS PERFORMED  Jonathan Luna 10/06/2023, 8:55 AM

## 2023-10-06 NOTE — Progress Notes (Signed)
Occupational Therapy Assessment and Plan  Patient Details  Name: Jonathan Luna MRN: [id number] Date of Birth: 1966-04-15  OT Diagnosis: ataxia, lumbago (low back pain), muscle weakness (generalized), and pain in joint Rehab Potential: Rehab Potential (ACUTE ONLY): Good ELOS: 12-14 days   Today's Date: 10/06/2023 OT Individual Time: 0900-1005 & 1415-1535 OT Individual Time Calculation (min): 65 min & 80 min     Hospital Problem: Principal Problem:   Acute inflammatory demyelinating polyneuropathy (HCC)   Past Medical History:  Past Medical History:  Diagnosis Date   Allergy    Anxiety    Arthritis    Cataract    Community acquired pneumonia 08/28/2023   Depression    Detached retina    Heart murmur    History of bilateral cataract extraction    Hyperlipidemia    Hypothyroidism    Low back pain    Panic attack    Panic attacks    Past Surgical History:  Past Surgical History:  Procedure Laterality Date   CATARACT EXTRACTION     EYE SURGERY      Assessment & Plan Clinical Impression: Jonathan Luna is a 57 year old male with history of HTN, chronic pain, depression, panic attacks, juvenile cataracts w/ minimal vision right, hypothyroid, PNA 07/2023 followed by onset of numbness, tingling, burning in feet/toes progressing to bilateral hands by the end of the month and progressed to gait disorder. He was readmitted 11/15-11/17/24 for sepsis due to PNA. He was evaluated by Dr. 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 He was sent to hospital for treatment and starated on IVIG the same day.  NIF -40 with VC 4.46 initially-->-40/2.5 L on next check. He was noted to have respiratory alkalosis with low BP and indapamide and irbesartan held.  On 12/19, he was noted to have "left facial weakness concerning for possible blood clotting SE as well as new oxygen requirement" and potential stroke and MRI brain ordered which was negative.    IVIG resumed and he has  completed 5 day course on 12/22.  NIF/VC being monitored when patient willing and last check @ -40/2.5 L. He has had gradual improvement in strength but not back to baseline. Notes indicate plans for LP X 2 on 12/31 at [facility] neurology? BP trending back up and hyponatremia stable. Therapy has been working with patient who requires mod to max assist with tendency for knees to buckle and shuffling gait. CIR recommended due to functional decline.  Patient transferred to CIR on 10/05/2023 .    Patient currently requires mod with basic self-care skills secondary to muscle weakness, decreased cardiorespiratoy endurance, ataxia and decreased coordination, and decreased standing balance and decreased balance strategies.  Prior to hospitalization, patient could complete ADLs with modified independent  prior to onset of recent weakness.  Patient will benefit from skilled intervention to decrease level of assist with basic self-care skills and increase independence with basic self-care skills prior to discharge home with care partner.  Anticipate patient will require intermittent supervision and follow up home health.  OT - End of Session Activity Tolerance: Improving Endurance Deficit: Yes OT Assessment Rehab Potential (ACUTE ONLY): Good OT Barriers to Discharge: Decreased caregiver support OT Barriers to Discharge Comments: wife is blind, cannot assist OT Patient demonstrates impairments in the following area(s): Balance;Endurance;Motor;Pain;Safety OT Basic ADL's Functional Problem(s): Grooming;Bathing;Dressing;Toileting OT Transfers Functional Problem(s): Toilet;Tub/Shower OT Additional Impairment(s): None OT Plan OT Intensity: Minimum of 1-2 x/day, 45 to 90 minutes OT Frequency: 5 out of  7 days OT Duration/Estimated Length of Stay: 12-14 days OT Treatment/Interventions: Balance/vestibular training;Disease mangement/prevention;Neuromuscular re-education;Self Care/advanced ADL retraining;Therapeutic  Exercise;DME/adaptive equipment instruction;Pain management;UE/LE Strength taining/ROM;Community reintegration;Patient/family education;UE/LE Coordination activities;Discharge planning;Functional mobility training;Psychosocial support;Therapeutic Activities;Visual/perceptual remediation/compensation OT Self Feeding Anticipated Outcome(s): mod I OT Basic Self-Care Anticipated Outcome(s): mod I OT Toileting Anticipated Outcome(s): mod I OT Bathroom Transfers Anticipated Outcome(s): mod I OT Recommendation Recommendations for Other Services: Therapeutic Recreation consult Therapeutic Recreation Interventions: Pet therapy Patient destination: Home Follow Up Recommendations: Home health OT Equipment Recommended: Tub/shower bench;To be determined;Standard walker   OT Evaluation Precautions/Restrictions  Precautions Precautions: Fall Restrictions Weight Bearing Restrictions Per Provider Order: No General Chart Reviewed: Yes Response to Previous Treatment: Not applicable Family/Caregiver Present: No Pain Pain Assessment Pain Scale: 0-10 Pain Score: 6  Pain Location: Back Home Living/Prior Functioning Home Living Family/patient expects to be discharged to:: Private residence Living Arrangements: Spouse/significant other Available Help at Discharge: Family (wife blind) Type of Home: House Home Access: Stairs to enter Secretary/administrator of Steps: 1 Entrance Stairs-Rails: Right Home Layout: One level Bathroom Shower/Tub: Engineer, manufacturing systems: Standard Bathroom Accessibility: Yes  Lives With: Spouse IADL History Homemaking Responsibilities: Yes Meal Prep Responsibility: Secondary Laundry Responsibility: Secondary Cleaning Responsibility: Secondary Shopping Responsibility: Primary Child Care Responsibility: No Current License: Yes Mode of Transportation: Other (comment) ([employer]) Occupation: Full time employment Type of Occupation: Retail buyer Leisure and  Hobbies: works out Prior Function Level of Independence: Independent with basic ADLs, Independent with homemaking with ambulation, Independent with transfers, Independent with gait  Able to Take Stairs?: Yes Driving: Yes Vocation: Full time employment Leisure: Hobbies-yes (Comment) Vision Baseline Vision/History: 1 Wears glasses Ability to See in Adequate Light: 0 Adequate Vision Assessment?: No apparent visual deficits Perception  Perception: Within Functional Limits Praxis Praxis: WFL Cognition Cognition Overall Cognitive Status: Within Functional Limits for tasks assessed Arousal/Alertness: Awake/alert Orientation Level: Person;Place;Situation Person: Oriented Place: Oriented Situation: Oriented Memory: Appears intact Awareness: Appears intact Problem Solving: Appears intact Safety/Judgment: Appears intact Brief Interview for Mental Status (BIMS) Repetition of Three Words (First Attempt): 3 Temporal Orientation: Year: Correct Temporal Orientation: Month: Accurate within 5 days Temporal Orientation: Day: Correct Recall: "Sock": Yes, no cue required Recall: "Blue": Yes, no cue required Recall: "Bed": Yes, no cue required BIMS Summary Score: 15 Sensation Sensation Light Touch: Impaired by gross assessment Hot/Cold: Appears Intact Proprioception: Impaired by gross assessment Stereognosis: Not tested Additional Comments: in hands and feet, feels edema sensation Coordination Gross Motor Movements are Fluid and Coordinated: No Fine Motor Movements are Fluid and Coordinated: No Coordination and Movement Description: finger to thumb WFL Motor  Motor Motor: Other (comment);Ataxia Motor - Skilled Clinical Observations: generalized weakness, slight ataxia noted in BUE and BLE, increased impaired in LUE  Trunk/Postural Assessment  Cervical Assessment Cervical Assessment: Within Functional Limits Thoracic Assessment Thoracic Assessment: Within Functional Limits Lumbar  Assessment Lumbar Assessment: Within Functional Limits Postural Control Postural Control: Within Functional Limits  Balance Balance Balance Assessed: Yes Static Sitting Balance Static Sitting - Balance Support: Feet supported Static Sitting - Level of Assistance: 5: Stand by assistance Dynamic Sitting Balance Dynamic Sitting - Balance Support: Feet supported;During functional activity Dynamic Sitting - Level of Assistance: 5: Stand by assistance Static Standing Balance Static Standing - Balance Support: No upper extremity supported;During functional activity Static Standing - Level of Assistance: 4: Min assist (CGA) Dynamic Standing Balance Dynamic Standing - Balance Support: During functional activity Dynamic Standing - Level of Assistance: 4: Min assist Dynamic Standing - Balance Activities: Reaching for objects;Forward  lean/weight shifting;Reaching across midline Extremity/Trunk Assessment RUE Assessment RUE Assessment: Exceptions to [facility] Active Range of Motion (AROM) Comments: WFL General Strength Comments: general weakness, slight ataxia, overall 4/5 LUE Assessment LUE Assessment: Exceptions to [facility] Active Range of Motion (AROM) Comments: WFL General Strength Comments: ataxia noted, worse in LUE, overall 4-/5  Care Tool Care Tool Self Care Eating   Eating Assist Level: Set up assist    Oral Care    Oral Care Assist Level: Supervision/Verbal cueing (standing)    Bathing   Body parts bathed by patient: Right arm;Left lower leg;Face;Left arm;Chest;Abdomen;Front perineal area;Buttocks;Right upper leg;Left upper leg;Right lower leg     Assist Level: Minimal Assistance - Patient > 75%    Upper Body Dressing(including orthotics)   What is the patient wearing?: Pull over shirt   Assist Level: Supervision/Verbal cueing    Lower Body Dressing (excluding footwear)     Assist for lower body dressing: Moderate Assistance - Patient 50 - 74%    Putting on/Taking off footwear    What is the patient wearing?: Non-skid slipper socks Assist for footwear: Moderate Assistance - Patient 50 - 74%       Care Tool Toileting Toileting activity   Assist for toileting: Contact Guard/Touching assist     Care Tool Bed Mobility Roll left and right activity   Roll left and right assist level: Supervision/Verbal cueing    Sit to lying activity   Sit to lying assist level: Supervision/Verbal cueing    Lying to sitting on side of bed activity   Lying to sitting on side of bed assist level: the ability to move from lying on the back to sitting on the side of the bed with no back support.: Supervision/Verbal cueing     Care Tool Transfers Sit to stand transfer   Sit to stand assist level: Minimal Assistance - Patient > 75%    Chair/bed transfer   Chair/bed transfer assist level: Minimal Assistance - Patient > 75%     Toilet transfer   Assist Level: Minimal Assistance - Patient > 75%     Care Tool Cognition  Expression of Ideas and Wants Expression of Ideas and Wants: 4. Without difficulty (complex and basic) - expresses complex messages without difficulty and with speech that is clear and easy to understand  Understanding Verbal and Non-Verbal Content Understanding Verbal and Non-Verbal Content: 4. Understands (complex and basic) - clear comprehension without cues or repetitions   Memory/Recall Ability Memory/Recall Ability : Current season;Location of own room;Staff names and faces;That he or she is in a hospital/hospital unit   Refer to Care Plan for Long Term Goals  SHORT TERM GOAL WEEK 1 OT Short Term Goal 1 (Week 1): Pt will complete dynamic standing during ADLs at CGA with LRAD OT Short Term Goal 2 (Week 1): Pt will complete LB dressing at CGA with AE as necessary OT Short Term Goal 3 (Week 1): Pt will complete bathing at [facility] with AE as necessary  Recommendations for other services: Therapeutic Recreation  Pet therapy   Skilled Therapeutic  Intervention ADL ADL Eating: Set up Where Assessed-Eating: Bed level Grooming: Contact guard (standing) Where Assessed-Grooming: Standing at sink Upper Body Bathing: Supervision/safety Where Assessed-Upper Body Bathing: Shower Lower Body Bathing: Minimal assistance (standing) Where Assessed-Lower Body Bathing: Shower Upper Body Dressing: Supervision/safety Where Assessed-Upper Body Dressing: Edge of bed Lower Body Dressing: Moderate assistance Where Assessed-Lower Body Dressing: Standing at sink;Sitting at sink Toileting: Contact guard Where Assessed-Toileting: Estate manager/land agent  Transfer Method: Ambulating Tub/Shower Transfer: Scientific laboratory technician Method: Ship broker: Walk in shower;Grab Clinical research associate: Administrator, arts Method: Warden/ranger: Grab bars;Transfer tub bench ADL Comments: Pt reported fatigue after ADL session in PM session. Pt requiring intermittent rest breaks during ADL d/t decreased endurance/fatigue. Pt standing on one leg completing peri care in shower requiring Min A for stability. Pt holding onto grab bar intermittently for support in standing with CGA. Pt using mouth to manage toothpaste cap d/t decreased sensation and ataxia in BUE. Mobility  Bed Mobility Bed Mobility: Supine to Sit Supine to Sit: Supervision/Verbal cueing Transfers Sit to Stand: Minimal Assistance - Patient > 75% Stand to Sit: Minimal Assistance - Patient > 75%  Session 1 1:1 evaluation and treatment session initiated this date. OT roles, goals and purpose discussed with pt as well as therapy schedule. ADL completed this date with levels of assist listed above. Pt with noted ataxia in UE and reported increased nerve pain in bilateral feet, receiving gabapentin this AM. No SOB/LOB during transfers. Pt able to stand during hand hygiene after toileting CGA with  no LOB without UE support. Slight ataxia during gait with RW no knee buckling noted. Increased ataxia in LUE versus RUE. Pt presented with incoordination and increased time to reach and retrieve paper towels with LUE. Pt would benefit from skilled OT in IPR setting in order to maximize independence with ADLs upon D/C.   Session 2 General: "Thank you so much!" Pt supine in bed upon OT arrival, agreeable to OT session.  Pain:  7/10 pain reported in back, activity, intermittent rest breaks, distractions provided for pain management, pt reports tolerable to proceed.   ADL: Full ADL completed this session, see ADL note in eval for full details.   Exercises: OT given HEP for personal dumbbells he brought from home including curls, external rotation, triceps extensions, chest press, slight overhead press and shoulder raises. Pt also given HEP and blue and green sponges for increased hand strengthening and FMC in BUE.    Pt supine in bed with bed alarm activated, 2 bed rails up, call light within reach and 4Ps assessed. Wife and sister-in-law present at end of session.   Discharge Criteria: Patient will be discharged from OT if patient refuses treatment 3 consecutive times without medical reason, if treatment goals not met, if there is a change in medical status, if patient makes no progress towards goals or if patient is discharged from hospital.  The above assessment, treatment plan, treatment alternatives and goals were discussed and mutually agreed upon: by patient  Velia Meyer, OTD, OTR/L 10/06/2023, 3:46 PM

## 2023-10-06 NOTE — Plan of Care (Signed)
  Problem: RH Balance Goal: LTG Patient will maintain dynamic standing with ADLs (OT) Description: LTG:  Patient will maintain dynamic standing balance with assist during activities of daily living (OT)  Flowsheets (Taken 10/06/2023 1255) LTG: Pt will maintain dynamic standing balance during ADLs with: Independent with assistive device   Problem: RH Grooming Goal: LTG Patient will perform grooming w/assist,cues/equip (OT) Description: LTG: Patient will perform grooming with assist, with/without cues using equipment (OT) Flowsheets (Taken 10/06/2023 1255) LTG: Pt will perform grooming with assistance level of: Independent with assistive device    Problem: RH Bathing Goal: LTG Patient will bathe all body parts with assist levels (OT) Description: LTG: Patient will bathe all body parts with assist levels (OT) Flowsheets (Taken 10/06/2023 1255) LTG: Pt will perform bathing with assistance level/cueing: Independent with assistive device    Problem: RH Dressing Goal: LTG Patient will perform upper body dressing (OT) Description: LTG Patient will perform upper body dressing with assist, with/without cues (OT). Flowsheets (Taken 10/06/2023 1255) LTG: Pt will perform upper body dressing with assistance level of: Independent with assistive device Goal: LTG Patient will perform lower body dressing w/assist (OT) Description: LTG: Patient will perform lower body dressing with assist, with/without cues in positioning using equipment (OT) Flowsheets (Taken 10/06/2023 1255) LTG: Pt will perform lower body dressing with assistance level of: Independent with assistive device   Problem: RH Toileting Goal: LTG Patient will perform toileting task (3/3 steps) with assistance level (OT) Description: LTG: Patient will perform toileting task (3/3 steps) with assistance level (OT)  Flowsheets (Taken 10/06/2023 1255) LTG: Pt will perform toileting task (3/3 steps) with assistance level: Independent with  assistive device   Problem: RH Toilet Transfers Goal: LTG Patient will perform toilet transfers w/assist (OT) Description: LTG: Patient will perform toilet transfers with assist, with/without cues using equipment (OT) Flowsheets (Taken 10/06/2023 1255) LTG: Pt will perform toilet transfers with assistance level of: Independent with assistive device   Problem: RH Tub/Shower Transfers Goal: LTG Patient will perform tub/shower transfers w/assist (OT) Description: LTG: Patient will perform tub/shower transfers with assist, with/without cues using equipment (OT) Flowsheets (Taken 10/06/2023 1255) LTG: Pt will perform tub/shower stall transfers with assistance level of: Independent with assistive device

## 2023-10-06 NOTE — Progress Notes (Signed)
Patient ID: EYAD AHLERS, male   DOB: 1966-02-11, 57 y.o.   MRN: [id number]  This SW covering for primary SW, Becky Dupree.   Pt not in room at time of follow-up for updates from team conference. SW will follow-up.  1621- SW left message for pt wife to provide updates from team conference, and inform on d/c date 1/7. SW will follow-up with updates.   Cecile Sheerer, MSW, LCSW Office: [phone] Cell: [phone] Fax: [phone]

## 2023-10-06 NOTE — Progress Notes (Signed)
Inpatient Rehabilitation Admission Medication Review by a Pharmacist  A complete drug regimen review was completed for this patient to identify any potential clinically significant medication issues.  High Risk Drug Classes Is patient taking? Indication by Medication  Antipsychotic Yes Compazine- nausea  Anticoagulant Yes Enoxaparin-VTE prophylaxis  Antibiotic No   Opioid Yes Oxycodone- chronic pain (lower back pain)  Antiplatelet Yes Aspirin-chronic pain (low back pain)  Hypoglycemics/insulin No   Vasoactive Medication Yes Metoprolol XL, Irbesartan - HTN  Chemotherapy No   Other Yes Albuterol, benzonatate, mucinex, guaifenesin DM- cough, wheezing, shortness of breath Gabapentin- peripheral neuropathy, chronic pain Levothyroxine- hypothyroid- ism ClonazePAM, Zoloft- panic anxiety syndrome/ depression/sleep Methocarbamol- muscle spasms Trazodone-sleep  Bisacodyl, fleet enema, miralax- constipation         Type of Medication Issue Identified Description of Issue Recommendation(s)  Drug Interaction(s) (clinically significant)     Duplicate Therapy     Allergy     No Medication Administration End Date     Incorrect Dose     Additional Drug Therapy Needed     Significant med changes from prior encounter (inform family/care partners about these prior to discharge). PTA meds: Vitamin D, indapamide, flonase not resumed on CIR admission.   Restart PTA meds when and if necessary during CIR admission or at time of discharge, if warranted   Other       Clinically significant medication issues were identified that warrant physician communication and completion of prescribed/recommended actions by midnight of the next day:  No  Name of provider notified for urgent issues identified:   Provider Method of Notification:     Pharmacist comments:   Time spent performing this drug regimen review (minutes):  20     Noah Delaine, RPh Clinical Pharmacist 10/06/2023 10:35 AM

## 2023-10-06 NOTE — Patient Care Conference (Signed)
Inpatient RehabilitationTeam Conference and Plan of Care Update Date: 10/06/2023   Time: 11:38 AM    Patient Name: Jonathan Luna      Medical Record Number: [id number]  Date of Birth: [birth year] Sex: Male         Room/Bed: 4W13C/4W13C-01 Payor Info: Payor: Advertising copywriter / Plan: [employer] OTHER / Product Type: *No Product type* /    Admit Date/Time:  10/05/2023  6:02 PM  Primary Diagnosis:  Acute inflammatory demyelinating polyneuropathy [facility])  Hospital Problems: Principal Problem:   Acute inflammatory demyelinating polyneuropathy [facility])    Expected Discharge Date: Expected Discharge Date: 10/20/23 Farley Ly pending)  Team Members Present: Physician leading conference: Dr. Genice Rouge Social Worker Present: Cecile Sheerer, LCSWA Nurse Present: Chana Bode, RN PT Present: Midge Minium, PT OT Present: Velia Meyer, OT SLP Present: Feliberto Gottron, SLP PPS Coordinator present : Fae Pippin, SLP     Current Status/Progress Goal Weekly Team Focus  Bowel/Bladder   Pt continent of b/b   Remain continent   Assist with toileting qshift and prn    Swallow/Nutrition/ Hydration               ADL's   CGA toileting, Min A-CGA transfers, SBA bed mobility supine>EOB, SBA UB dressing, Mod A LB dressing, CGA grooming/oral hygiene in standing with RW   mod I   managing ataxia, ADL retraining, full ADL in PM session    Mobility   eval pending   eval pending  eval pending    Communication                Safety/Cognition/ Behavioral Observations               Pain   Pt c/o bilateral burning sensation in lower extremities. prn meds given (see mar)   <3 pain score   Assess qshift and prn    Skin   Skin intact   Maintain skin integrity  Assess qshift and prn      Discharge Planning:  TBA. per EMR, pt will d/c to home with wife and PRN support from brother. SW will confirm there are no barriers to discharge.   Team Discussion: Patient post AIDP with  nerve pain in feet and ataxia in upper extremities. Cough and wheezing for 2 days reported.  Patient on target to meet rehab goals: Currently needs CGA - min assist for transfers. Needs min assist for ADLs.  *See Care Plan and progress notes for long and short-term goals.   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, ataxia Possible Resolutions to [employer]: added Tessalonpearls and Albuterolinhalers and Murcinex BID- for  and added Gabapentin 400mg  TID for nerve pain- 10/20/23 but if progresses faster, willleave faster   Continued Need for Acute Rehabilitation Level of Care: The patient requires daily medical management by a physician with specialized training in physical medicine and rehabilitation for the following reasons: Direction of a multidisciplinary physical rehabilitation program to maximize functional independence : Yes Medical management of patient stability for increased activity during participation in an intensive  rehabilitation regime.: Yes Analysis of laboratory values and/or radiology reports with any subsequent need for medication adjustment and/or medical intervention. : Yes   I attest that I was present, lead the team conference, and concur with the assessment and plan of the team.   Chana Bode B 10/06/2023, 4:45 PM

## 2023-10-06 NOTE — Progress Notes (Signed)
Met with patient to review rehab schedule, different therapies provided, team conference and ELOS. Patient is eager to getting started. 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 Also discussed pain management and other measures to promote comfort and sleep. Discussed triglycerides and LDLs and dietary modification and medication management. Patient is Pleasant and receptive.     Marylu Lund, RN

## 2023-10-06 NOTE — Progress Notes (Signed)
Inpatient Rehabilitation  Patient information reviewed and entered into eRehab system by Oyuki Hogan M. Eri Mcevers, M.A., CCC/SLP, PPS Coordinator.  Information including medical coding, functional ability and quality indicators will be reviewed and updated through discharge.    

## 2023-10-07 DIAGNOSIS — G610 Guillain-Barre syndrome: Secondary | ICD-10-CM | POA: Diagnosis not present

## 2023-10-07 DIAGNOSIS — G8250 Quadriplegia, unspecified: Secondary | ICD-10-CM

## 2023-10-07 MED ORDER — GABAPENTIN 400 MG PO CAPS
400.0000 mg | ORAL_CAPSULE | Freq: Three times a day (TID) | ORAL | Status: DC
  Administered 2023-10-07 – 2023-10-08 (×4): 400 mg via ORAL
  Filled 2023-10-07 (×4): qty 1

## 2023-10-07 NOTE — IPOC Note (Signed)
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 [facility])  Hospital Problems: Principal Problem:   Acute inflammatory demyelinating polyneuropathy (HCC)     Functional Problem List: Nursing Pain, Safety, Endurance, Medication Management  PT Balance, Endurance, Pain, Sensory, Motor, Perception  OT Balance, Endurance, Motor, Pain, Safety  SLP    TR         Basic ADL's: OT Grooming, Bathing, Dressing, Toileting     Advanced  ADL's: OT       Transfers: PT Bed Mobility, Bed to Chair, Customer service manager, Tub/Shower     Locomotion: PT Ambulation     Additional Impairments: OT None  SLP        TR      Anticipated Outcomes Item Anticipated Outcome  Self Feeding mod I  Swallowing      Basic self-care  mod I  Toileting  mod I   Bathroom Transfers mod I  Bowel/Bladder  n/a  Transfers  Mod I  Locomotion  Mod I  Communication     Cognition     Pain  < 4 with prns  Safety/Judgment  manage w cues   Therapy Plan: PT Intensity: Minimum of 1-2 x/day ,45 to 90 minutes PT Frequency: 5 out of 7 days PT Duration Estimated Length of Stay: 2 weeks OT Intensity: Minimum of 1-2 x/day, 45 to 90 minutes OT Frequency: 5 out of 7 days OT Duration/Estimated Length of Stay: 12-14 days     Team Interventions: Nursing Interventions Bladder Management, Pain Management, Medication Management, Disease Management/Prevention, Patient/Family Education, Discharge Planning  PT interventions Ambulation/gait training, Warden/ranger, Community reintegration, Discharge planning, Disease management/prevention, DME/adaptive equipment instruction, Functional mobility training, Neuromuscular re-education, Pain management, Patient/family education, Psychosocial support, Splinting/orthotics, Stair training, Therapeutic Activities, Therapeutic Exercise, UE/LE Strength  taining/ROM, UE/LE Coordination activities, Wheelchair propulsion/positioning, Visual/perceptual remediation/compensation  OT Interventions Warden/ranger, Disease mangement/prevention, Neuromuscular re-education, Self Care/advanced ADL retraining, Therapeutic Exercise, DME/adaptive equipment instruction, Pain management, UE/LE Strength taining/ROM, Community reintegration, Patient/family education, UE/LE Coordination activities, Discharge planning, Functional mobility training, Psychosocial support, Therapeutic Activities, Visual/perceptual remediation/compensation  SLP Interventions    TR Interventions    SW/CM Interventions Discharge Planning, Psychosocial Support, Patient/Family Education   Barriers to Discharge MD  Medical stability  Nursing Decreased caregiver support mod I, wife assists with IADLs. Independent/Modified Independent, History of Falls (last six months)  Mobility Comments: ambulating with RW.5 falls in the last 3 weeks (without the RW); no fall since RW use  PT Decreased caregiver support house, 1 lvl with 1 STE no rails  OT Decreased caregiver support wife is blind, cannot assist  SLP      SW Decreased caregiver support, Lack of/limited family support, Community education officer for SNF coverage     Team Discharge Planning: Destination: PT-Home ,OT- Home , SLP-  Projected Follow-up: PT-Outpatient PT, OT-  Home health OT, SLP-  Projected Equipment Needs: PT-To be determined, OT- Tub/shower bench, To be determined, Standard walker, SLP-  Equipment Details: PT-owns RW, OT-  Patient/family involved in discharge planning: PT- Patient,  OT-Patient, SLP-   MD ELOS: 12-14d Medical Rehab Prognosis:  Good Assessment: The patient has been admitted for CIR therapies with the diagnosis of AIDP. The team will be addressing functional mobility, strength, stamina, balance, safety, adaptive techniques and equipment, self-care, bowel and bladder mgt, patient and caregiver education,  essential hypertension . Goals have been set at Mod I. Anticipated discharge destination is Home .  See Team Conference Notes for weekly updates to the plan of care

## 2023-10-07 NOTE — Progress Notes (Signed)
PROGRESS NOTE   Subjective/Complaints:  Discussed GBS as well as IVIG effects , time frame of recovery and sensory complaints    ROS:  Pt denies SOB, abd pain, CP, N/V/C/D, and vision changes  Objective:   No results found. Recent Labs    10/06/23 0456  WBC 5.8  HGB 13.6  HCT 40.6  PLT 332   Recent Labs    10/06/23 0456  NA 142  K 3.9  CL 108  CO2 24  GLUCOSE 93  BUN 9  CREATININE 0.68  CALCIUM 9.1    Intake/Output Summary (Last 24 hours) at 10/07/2023 0910 Last data filed at 10/07/2023 0829 Gross per 24 hour  Intake 1080 ml  Output --  Net 1080 ml        Physical Exam: Vital Signs Blood pressure (!) 137/90, pulse 75, temperature 98.1 F (36.7 C), resp. rate 18, height 5\' 7"  (1.702 m), weight 83.5 kg, SpO2 94%.   General: No acute distress Mood and affect are appropriate Heart: Regular rate and rhythm no rubs murmurs or extra sounds Lungs: Clear to auscultation, breathing unlabored, no rales or wheezes Abdomen: Positive bowel sounds, soft nontender to palpation, nondistended Extremities: No clubbing, cyanosis, or edema, pedal pulses normal  Skin: No evidence of breakdown, no evidence of rash  Neurological: Ox3 MSK:      No apparent deformity.  ?  Mild left upper extremity drop arm, needs to hike shoulder to initiate.      Bilateral second and third digits with decreased range of motion in finger flexion, extension.  No palpable cyst, contracture, or trigger finger.   Neurologic exam:  Cognition: AAO to person, place, time and event.   CN: Mild left facial droop as above, otherwise intact. Coordination: Bilateral upper extremity intention tremors Spasticity: MAS 0 in all extremities.  Strength:                RUE: 5/5 SA, 5/5 EF, 5/5 EE, 4/5 WE, 3/5 FA, 4/5 FF                LUE:  5/5 SA, 5/5 EF, 5/5 EE, 4/5 WE, 3/5 FA, 4/5 FF                RLE: 5/5 HF, 5/5 KE, 4/5  DF, 2/5  EHL, 4/5   PF                 LLE:  5/5 HF, 5/5 KE, 4/5  DF, 2/5  EHL, 4/5  PF   Sensation intact to LT and Proprio B UE and LE    Assessment/Plan: 1. Functional deficits which require 3+ hours per day of interdisciplinary therapy in a comprehensive inpatient rehab setting. Physiatrist is providing close team supervision and 24 hour management of active medical problems listed below. Physiatrist and rehab team continue to assess barriers to discharge/monitor patient progress toward functional and medical goals  Care Tool:  Bathing    Body parts bathed by patient: Right arm, Left lower leg, Face, Left arm, Chest, Abdomen, Front perineal area, Buttocks, Right upper leg, Left upper leg, Right lower leg         Bathing assist  Assist Level: Minimal Assistance - Patient > 75%     Upper Body Dressing/Undressing Upper body dressing   What is the patient wearing?: Pull over shirt    Upper body assist Assist Level: Supervision/Verbal cueing    Lower Body Dressing/Undressing Lower body dressing            Lower body assist Assist for lower body dressing: Moderate Assistance - Patient 50 - 74%     Toileting Toileting    Toileting assist Assist for toileting: Contact Guard/Touching assist     Transfers Chair/bed transfer  Transfers assist  Chair/bed transfer activity did not occur: Refused (patient did not get up)  Chair/bed transfer assist level: Minimal Assistance - Patient > 75%     Locomotion Ambulation   Ambulation assist      Assist level: Minimal Assistance - Patient > 75% Assistive device: Walker-rolling Max distance: 150 ft   Walk 10 feet activity   Assist     Assist level: Minimal Assistance - Patient > 75% Assistive device: Walker-rolling   Walk 50 feet activity   Assist    Assist level: Minimal Assistance - Patient > 75% Assistive device: Walker-rolling    Walk 150 feet activity   Assist    Assist level: Minimal Assistance - Patient >  75% Assistive device: Walker-rolling    Walk 10 feet on uneven surface  activity   Assist Walk 10 feet on uneven surfaces activity did not occur: Safety/medical concerns (fatigue)         Wheelchair     Assist Is the patient using a wheelchair?: No Type of Wheelchair: Manual    Wheelchair assist level: Dependent - Patient 0% Max wheelchair distance: 200 ft    Wheelchair 50 feet with 2 turns activity    Assist        Assist Level: Dependent - Patient 0%   Wheelchair 150 feet activity     Assist      Assist Level: Dependent - Patient 0%   Blood pressure (!) 137/90, pulse 75, temperature 98.1 F (36.7 C), resp. rate 18, height 5\' 7"  (1.702 m), weight 83.5 kg, SpO2 94%.  Medical Problem List and Plan: 1. 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         Con't CIR PT and OT- first day of evaluations Team conference today to determine length of stay   2.  Antithrombotics: -DVT/anticoagulation:  Pharmaceutical: Lovenox             -antiplatelet therapy: N/A   3.Chronic LBP/Pain Management: Continue oxycodone prn-->managed by PCP             --on Gabapentin 100 mg BID for neuropathy -> Per discussion with patient on admission, ongoing hypersensitivity on bottom of feet, discussed gabapentin adjustment vs. Lyrica, opted  to switch to gabapentin 300 mg nightly.   12/24- will change Gabapentin to 400 mg TID for now- can titrate up more if needed; will also add Robaxin 500 mg q6  hours prn for muscle spasms 12/25 increase gabapentin to QID  4. Mood/Behavior/Sleep: Stable on Zoloft with low dose klonopin prn. LCSW to follow for evaluation and support.              -antipsychotic agents: N/A             --has been using Klonopin for sleep   5. Neuropsych/cognition: This patient is capable of making decisions on his own behalf. 6. Skin/Wound Care: Routine pressure relief measures.  7. Fluids/Electrolytes/Nutrition: Monitor I/O. Check CMET in am 8. HTN: Monitor BP TID--continue  metoprolol and irbesartan  12/24- BP controlled- con't regimen 9. Hyponatremia: resolved     Latest Ref Rng & Units 10/06/2023    4:56 AM 10/01/2023    5:29 AM 09/30/2023    4:18 PM  BMP  Glucose 70 - 99 mg/dL 93  85    BUN 6 - 20 mg/dL 9  11    Creatinine 1.61 - 1.24 mg/dL 0.96  0.45    Sodium 409 - 145 mmol/L 142  133  128   Potassium 3.5 - 5.1 mmol/L 3.9  3.6  4.2   Chloride 98 - 111 mmol/L 108  99    CO2 22 - 32 mmol/L 24  27    Calcium 8.9 - 10.3 mg/dL 9.1  9.0      10. H/o anxiety disorder: Off Buspar as was ineffective. Continue Klonopin bid prn 11. H/o Depression: Managed with low dose Zoloft.  12. Hypothyroid: Continue synthroid 125 mc daily for supplement.   13. Cough/wheezing  12/24- no elevated WBC; Afebrile- will treat Sx's- with Mucinex 600 mg BID, Tessalon pearls scheduled 100 mg TID for 5 days and Albuterol inhaler, per pt request, not nebs-      LOS: 2 days A FACE TO FACE EVALUATION WAS PERFORMED  Erick Colace 10/07/2023, 9:10 AM

## 2023-10-07 NOTE — Care Management (Signed)
Inpatient Rehabilitation Center Individual Statement of Services  Patient Name:  Jonathan Luna  Date:  10/07/2023  Welcome to the Inpatient Rehabilitation Center.  Our goal is to provide you with an individualized program based on your diagnosis and situation, designed to meet your specific needs.  With this comprehensive rehabilitation program, you will be expected to participate in at least 3 hours of rehabilitation therapies Monday-Friday, with modified therapy programming on the weekends.  Your rehabilitation program will include the following services:  Physical Therapy (PT), Occupational Therapy (OT), 24 hour per day rehabilitation nursing, Therapeutic Recreaction (TR), Psychology, Neuropsychology, Care Coordinator, Rehabilitation Medicine, Nutrition Services, Pharmacy Services, and Other  Weekly team conferences will be held on Tuesdays to discuss your progress.  Your Inpatient Rehabilitation Care Coordinator will talk with you frequently to get your input and to update you on team discussions.  Team conferences with you and your family in attendance may also be held.  Expected length of stay: 12-14 days    Overall anticipated outcome: Independent with Assistive Device  Depending on your progress and recovery, your program may change. Your Inpatient Rehabilitation Care Coordinator will coordinate services and will keep you informed of any changes. Your Inpatient Rehabilitation Care Coordinator's name and contact numbers are listed  below.  The following services may also be recommended but are not provided by the Inpatient Rehabilitation Center:  Driving Evaluations Home Health Rehabiltiation Services Outpatient Rehabilitation Services Vocational Rehabilitation   Arrangements will be made to provide these services after discharge if needed.  Arrangements include referral to agencies that provide these services.  Your insurance has been verified to be:  [facility]  Your primary doctor is:   Sanda Linger  Pertinent information will be shared with your doctor and your insurance company.  Inpatient Rehabilitation Care Coordinator:  Dossie Der, [place] [phone] or [name]  Information discussed with and copy given to patient by: Gretchen Short, 10/07/2023, 2:56 PM

## 2023-10-08 DIAGNOSIS — E871 Hypo-osmolality and hyponatremia: Secondary | ICD-10-CM | POA: Diagnosis not present

## 2023-10-08 DIAGNOSIS — I10 Essential (primary) hypertension: Secondary | ICD-10-CM

## 2023-10-08 DIAGNOSIS — G610 Guillain-Barre syndrome: Secondary | ICD-10-CM | POA: Diagnosis not present

## 2023-10-08 LAB — LIPID PANEL
Cholesterol: 175 mg/dL (ref 0–200)
HDL: 34 mg/dL — ABNORMAL LOW (ref >40)
LDL Cholesterol: 106 mg/dL — ABNORMAL HIGH (ref 0–99)
Total CHOL/HDL Ratio: 5.1 {ratio}
Triglycerides: 177 mg/dL — ABNORMAL HIGH (ref <150)
VLDL: 35 mg/dL (ref 0–40)

## 2023-10-08 MED ORDER — GABAPENTIN 300 MG PO CAPS
600.0000 mg | ORAL_CAPSULE | Freq: Three times a day (TID) | ORAL | Status: DC
  Administered 2023-10-08 – 2023-10-11 (×12): 600 mg via ORAL
  Filled 2023-10-08 (×12): qty 2

## 2023-10-08 NOTE — Progress Notes (Signed)
PROGRESS NOTE   Subjective/Complaints:  Reviewed IVIG today and how it works and whether he'll need more, time course of recovery, prognosis, etc. He is having some ongoing pain. Is interested in increasing gaapentin if possible  ROS: Patient denies fever, rash, sore throat, blurred vision, dizziness, nausea, vomiting, diarrhea, cough, shortness of breath or chest pain, joint or back/neck pain, headache, or mood change.   Objective:   No results found. Recent Labs    10/06/23 0456  WBC 5.8  HGB 13.6  HCT 40.6  PLT 332   Recent Labs    10/06/23 0456  NA 142  K 3.9  CL 108  CO2 24  GLUCOSE 93  BUN 9  CREATININE 0.68  CALCIUM 9.1    Intake/Output Summary (Last 24 hours) at 10/08/2023 1146 Last data filed at 10/07/2023 1800 Gross per 24 hour  Intake 480 ml  Output --  Net 480 ml        Physical Exam: Vital Signs Blood pressure (!) 134/94, pulse 71, temperature 97.8 F (36.6 C), resp. rate 16, height 5\' 7"  (1.702 m), weight 83.5 kg, SpO2 97%.   Constitutional: No distress . Vital signs reviewed. HEENT: NCAT, EOMI, oral membranes moist Neck: supple Cardiovascular: RRR without murmur. No JVD    Respiratory/Chest: CTA Bilaterally without wheezes or rales. Normal effort    GI/Abdomen: BS +, non-tender, non-distended Ext: no clubbing, cyanosis, or edema Psych: pleasant and cooperative   Skin: No evidence of breakdown, no evidence of rash  Neurological: Ox3 MSK:      No apparent deformity.  ?  Mild left upper extremity drop arm, needs to hike shoulder to initiate.      Bilateral second and third digits with decreased range of motion in finger flexion, extension.  No palpable cyst, contracture, or trigger finger.   Neurologic exam:  Cognition: AAO to person, place, time and event.   CN: ? left facial droop as above, otherwise intact. Dysconjugate gaze Coordination: Bilateral upper extremity intention  tremors Spasticity: MAS 0 in all extremities.  Strength:                RUE: 5/5 SA, 5/5 EF, 5/5 EE, 4/5 WE, 3/5 FA, 4/5 FF                LUE:  5/5 SA, 5/5 EF, 5/5 EE, 4/5 WE, 3/5 FA, 4/5 FF                RLE: 5/5 HF, 5/5 KE, 4/5  DF, 2/5  EHL, 4/5  PF                 LLE:  5/5 HF, 5/5 KE, 4/5  DF, 2/5  EHL, 4/5  PF   Sensation intact to LT and Proprio B UE and LE except along fingers and toes/soles of feet    Assessment/Plan: 1. Functional deficits which require 3+ hours per day of interdisciplinary therapy in a comprehensive inpatient rehab setting. Physiatrist is providing close team supervision and 24 hour management of active medical problems listed below. Physiatrist and rehab team continue to assess barriers to discharge/monitor patient progress toward functional and medical goals  Care  Tool:  Bathing    Body parts bathed by patient: Right arm, Left lower leg, Face, Left arm, Chest, Abdomen, Front perineal area, Buttocks, Right upper leg, Left upper leg, Right lower leg         Bathing assist Assist Level: Minimal Assistance - Patient > 75%     Upper Body Dressing/Undressing Upper body dressing   What is the patient wearing?: Pull over shirt    Upper body assist Assist Level: Supervision/Verbal cueing    Lower Body Dressing/Undressing Lower body dressing            Lower body assist Assist for lower body dressing: Moderate Assistance - Patient 50 - 74%     Toileting Toileting    Toileting assist Assist for toileting: Contact Guard/Touching assist     Transfers Chair/bed transfer  Transfers assist  Chair/bed transfer activity did not occur: Refused (patient did not get up)  Chair/bed transfer assist level: Minimal Assistance - Patient > 75%     Locomotion Ambulation   Ambulation assist      Assist level: Minimal Assistance - Patient > 75% Assistive device: Walker-rolling Max distance: 150 ft   Walk 10 feet activity   Assist      Assist level: Minimal Assistance - Patient > 75% Assistive device: Walker-rolling   Walk 50 feet activity   Assist    Assist level: Minimal Assistance - Patient > 75% Assistive device: Walker-rolling    Walk 150 feet activity   Assist    Assist level: Minimal Assistance - Patient > 75% Assistive device: Walker-rolling    Walk 10 feet on uneven surface  activity   Assist Walk 10 feet on uneven surfaces activity did not occur: Safety/medical concerns (fatigue)         Wheelchair     Assist Is the patient using a wheelchair?: No Type of Wheelchair: Manual    Wheelchair assist level: Dependent - Patient 0% Max wheelchair distance: 200 ft    Wheelchair 50 feet with 2 turns activity    Assist        Assist Level: Dependent - Patient 0%   Wheelchair 150 feet activity     Assist      Assist Level: Dependent - Patient 0%   Blood pressure (!) 134/94, pulse 71, temperature 97.8 F (36.6 C), resp. rate 16, height 5\' 7"  (1.702 m), weight 83.5 kg, SpO2 97%.  Medical Problem List and Plan: 1. 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   12/26, since he's here on rehab, will see about having that done here            2.  Antithrombotics: -DVT/anticoagulation:  Pharmaceutical: Lovenox             -antiplatelet therapy: N/A   3.Chronic LBP/Pain Management:  Continue oxycodone prn-->managed by PCP             --on Gabapentin 100 mg BID for neuropathy -> Per discussion  with patient on admission, ongoing hypersensitivity on bottom of feet, discussed gabapentin adjustment vs. Lyrica, opted to switch to gabapentin 300 mg nightly.   12/24- will change Gabapentin to 400 mg TID for now- can titrate up more if needed; will also add Robaxin 500 mg q6 hours prn for muscle spasms 12/26--gabapentin wasn't increased--adjust to 600mg  tid today  4. Mood/Behavior/Sleep: Stable on Zoloft with low dose klonopin prn. LCSW to follow for evaluation and support.              -antipsychotic agents: N/A             --has been using Klonopin for sleep   5. Neuropsych/cognition: This patient is capable of making decisions on his own behalf. 6. Skin/Wound Care: Routine pressure relief measures.  7. Fluids/Electrolytes/Nutrition: Monitor I/O. Check CMET in am 8. HTN: Monitor BP TID--continue  metoprolol and irbesartan  12/26- BP fairly controlled- con't regimen 9. Hyponatremia: resolved     Latest Ref Rng & Units 10/06/2023    4:56 AM 10/01/2023    5:29 AM 09/30/2023    4:18 PM  BMP  Glucose 70 - 99 mg/dL 93  85    BUN 6 - 20 mg/dL 9  11    Creatinine 1.61 - 1.24 mg/dL 0.96  0.45    Sodium 409 - 145 mmol/L 142  133  128   Potassium 3.5 - 5.1 mmol/L 3.9  3.6  4.2   Chloride 98 - 111 mmol/L 108  99    CO2 22 - 32 mmol/L 24  27    Calcium 8.9 - 10.3 mg/dL 9.1  9.0      10. H/o anxiety disorder: Off Buspar as was ineffective. Continue Klonopin bid prn 11. H/o Depression: Managed with low dose Zoloft.  12. Hypothyroid: Continue synthroid 125 mc daily for supplement.   13. Cough/wheezing  12/24- no elevated WBC; Afebrile- will treat Sx's- with Mucinex 600 mg BID, Tessalon pearls scheduled 100 mg TID for 5 days and Albuterol inhaler, per pt request, not nebs-   12/26 seems to have resolved. Chest clear on exam today     LOS: 3 days A FACE TO FACE EVALUATION WAS PERFORMED  Ranelle Oyster 10/08/2023, 11:46 AM

## 2023-10-08 NOTE — Discharge Instructions (Signed)
Inpatient Rehab Discharge Instructions  FINEAS TWEET Discharge date and time:    Activities/Precautions/ Functional Status: Activity: no lifting, driving, or strenuous exercise till cleared by MD Diet: regular diet Wound Care: none needed   Functional status:  ___ No restrictions     ___ Walk up steps independently ___ 24/7 supervision/assistance   ___ Walk up steps with assistance ___ Intermittent supervision/assistance  ___ Bathe/dress independently ___ Walk with walker     ___ Bathe/dress with assistance ___ Walk Independently    ___ Shower independently ___ Walk with assistance    ___ Shower with assistance _X__ No alcohol     ___ Return to work/school ________   Special Instructions:    My questions have been answered and I understand these instructions. I will adhere to these goals and the provided educational materials after my discharge from the hospital.  Patient/Caregiver Signature _______________________________ Date __________  Clinician Signature _______________________________________ Date __________  Please bring this form and your medication list with you to all your follow-up doctor's appointments.

## 2023-10-08 NOTE — Progress Notes (Signed)
Physical Therapy Session Note  Patient Details  Name: Jonathan Luna MRN: [id number] Date of Birth: 1966/08/18  Today's Date: 10/08/2023 PT Individual Time: 1345-1426 PT Individual Time Calculation (min): 41 min   Short Term Goals: Week 1:  PT Short Term Goal 1 (Week 1): Pt will require CGA with bed<>chair transfer with LRAD PT Short Term Goal 2 (Week 1): Pt will require CGA with gait >100 ft with LRAD PT Short Term Goal 3 (Week 1): Pt will require CGA with navigation of steps x 4  Skilled Therapeutic Interventions/Progress Updates:   Received pt supine in bed, pt agreeable to PT treatment, and reported pain 8/10 in low back (premedicated). Session with emphasis on functional mobility/transfers, generalized strengthening and endurance, dynamic standing balance/coordination, and gait training. Pt transferred supine<>sitting EOB with supervision/mod I and performed stand<>pivot bed<>WC without AD and min A. Pt transported to/from room in [facility] dependently for time management purposes.   Pt stood with RW and min A and ambulated 358ft with RW and 4lb ankle weights on each leg with min A for balance - cues for heel strike and to widen BOS. Pt then performed seated BLE strengthening on Kinetron with 4lb ankle weights at 20 cm/sec for 1 minute x 4 trials with emphasis on glute/wad strength - pt required rest/water breaks in between. Pt then performed WC mobility 1ft using BLE and supervision back to room with emphasis on hamstring strength. Demonstrated seated hamstring stretch for pt to work on in between therapies. Concluded session with pt sitting in [facility] with all needs within reach - handoff to primary PT.   Therapy Documentation Precautions:  Precautions Precautions: Fall Restrictions Weight Bearing Restrictions Per Provider Order: No  Therapy/Group: Individual Therapy Marlana Salvage Zaunegger Blima Rich PT, DPT 10/08/2023, 7:12 AM

## 2023-10-08 NOTE — Progress Notes (Signed)
Physical Therapy Session Note  Patient Details  Name: Jonathan Luna MRN: [id number] Date of Birth: [birth year]  Today's Date: 10/08/2023 PT Individual Time: 4098-1191 PT Individual Time Calculation (min): 36 min   Short Term Goals: Week 1:  PT Short Term Goal 1 (Week 1): Pt will require CGA with bed<>chair transfer with LRAD PT Short Term Goal 2 (Week 1): Pt will require CGA with gait >100 ft with LRAD PT Short Term Goal 3 (Week 1): Pt will require CGA with navigation of steps x 4  Skilled Therapeutic Interventions/Progress Updates: Pt presented in bed agreeable to therapy. Pt states mild unrated back pain, premedicated with rest break provided during session. Session focused on strength, balance, and gait. Performed supine to sit with supervision then performed Sit to stand with CGA. Pt ambulated to day room with RW and w/c follow. Participated in Sit to stand without AD 2 x 5 with focus on eccentric control. Performed heel cord stretch standing on blue wedge with RW performing reaching tasks. Pt also performed toe taps to target on 4in block 2 x 10 with R HHA only. Pt then ambulated back to room in same manner as prior with cues for increased heel strike. Pt agreeable to remain in w/c at end of session as next session in approx 30 min. Pt left in w/c with call bell within reach and needs met.      Therapy Documentation Precautions:  Precautions Precautions: Fall Restrictions Weight Bearing Restrictions Per Provider Order: No  Therapy/Group: Individual Therapy  Ilir Mahrt 10/08/2023, 12:27 PM

## 2023-10-08 NOTE — Progress Notes (Signed)
Inpatient Rehabilitation Care Coordinator Assessment and Plan Patient Details  Name: Jonathan Luna MRN: [id number] Date of Birth: 1966-01-02  Today's Date: 10/08/2023  Hospital Problems: Principal Problem:   Acute inflammatory demyelinating polyneuropathy [facility])  Past Medical History:  Past Medical History:  Diagnosis Date   Allergy    Anxiety    Arthritis    Cataract    Community acquired pneumonia 08/28/2023   Depression    Detached retina    Heart murmur    History of bilateral cataract extraction    Hyperlipidemia    Hypothyroidism    Low back pain    Panic attack    Panic attacks    Past Surgical History:  Past Surgical History:  Procedure Laterality Date   CATARACT EXTRACTION     EYE SURGERY     Social History:  reports that he has never smoked. He has been exposed to tobacco smoke. He has never used smokeless tobacco. He reports that he does not currently use alcohol. He reports that he does not use drugs.  Family / Support Systems Marital Status: Married How Long?: 11 years Patient Roles: Spouse Spouse/Significant Other: Clydie Braun (wife) Children: no children Other Supports: Phillip Heal to help Anticipated Caregiver: Wife Clydie Braun Ability/Limitations of Caregiver: Wife is legally blind but ablee to assist Caregiver Availability: 24/7 Family Dynamics: Pt lives with his wife.  Social History Preferred language: English Religion: Christian Cultural Background: Pt has been working as a Marketing executive for 13 yrs Education: Charity fundraiser - How often do you need to have someone help you when you read instructions, pamphlets, or other written material from your doctor or pharmacy?: Never Writes: Yes Employment Status: Employed Return to Work Plans: TBD. Has FMLA/STD forms in place Legal History/Current Legal Issues: denies Guardian/Conservator: N/A   Abuse/Neglect Abuse/Neglect Assessment Can Be Completed: Yes Physical Abuse: Denies Verbal Abuse:  Denies Sexual Abuse: Denies Exploitation of patient/patient's resources: Denies Self-Neglect: Denies  Patient response to: Social Isolation - How often do you feel lonely or isolated from those around you?: Never  Emotional Status Pt's affect, behavior and adjustment status: Pt in good spirits at time of visit. He is happy he has been able to get the help he needs. Recent Psychosocial Issues: See above Psychiatric History: Admits he has been taking medication for anxiety prescribed by PCP. Reports it causes him headaches. Substance Abuse History: Denies  Patient / Family Perceptions, Expectations & Goals Pt/Family understanding of illness & functional limitations: Pt has a general understanding of pt care needs Premorbid pt/family roles/activities: Independent Anticipated changes in roles/activities/participation: Assistance with ADLs/IADLs Pt/family expectations/goals: Pt goal is to build up strength in his legs.  Community Resources [employer]: None Premorbid Home Care/DME Agencies: None Transportation available at discharge: Wife Clydie Braun Is the patient able to respond to transportation needs?: Yes In the past 12 months, has lack of transportation kept you from medical appointments or from getting medications?: No In the past 12 months, has lack of transportation kept you from meetings, work, or from getting things needed for daily living?: No Resource referrals recommended: Neuropsychology  Discharge Planning Living Arrangements: Spouse/significant other Support Systems: Spouse/significant other, Other relatives Type of Residence: Private residence Insurance Resources: Media planner (specify) Education officer, museum) Financial Resources: Employment Financial Screen Referred: No Money Management: Spouse Does the patient have any problems obtaining your medications?: No Home Management: pt wife does house cleaning. Pt prepared all meals Patient/Family Preliminary Plans: TBD Care  Coordinator Barriers to Discharge: Decreased caregiver support, Lack of/limited  family support, Insurance for SNF coverage Care Coordinator Anticipated Follow Up Needs: HH/OP Expected length of stay: D/c date 1/7  Clinical Impression SW met with pt in room to introduce self, explain role, discuss discharge process, and inform on d/c date 1/7. Pt is not a Cytogeneticist. No HCPOA. Dme given to him by a neighbor- RW, TTB but does not like to use it, and gripper to open can sodas. Aware there will be follow-up with his wife.   Martiza Speth A Kennis Buell 10/08/2023, 4:08 PM

## 2023-10-08 NOTE — Progress Notes (Signed)
Physical Therapy Session Note  Patient Details  Name: Jonathan Luna MRN: [id number] Date of Birth: 07-Jul-1966  Today's Date: 10/08/2023 PT Individual Time: 0905-1000 PT Individual Time Calculation (min): 55 min   Short Term Goals: Week 1:  PT Short Term Goal 1 (Week 1): Pt will require CGA with bed<>chair transfer with LRAD PT Short Term Goal 2 (Week 1): Pt will require CGA with gait >100 ft with LRAD PT Short Term Goal 3 (Week 1): Pt will require CGA with navigation of steps x 4  Skilled Therapeutic Interventions/Progress Updates:      Therapy Documentation Precautions:  Precautions Precautions: Fall Restrictions Weight Bearing Restrictions Per Provider Order: No  Pt agreeable to PT session with emphasis on global strength training as pt presents with bilateral knee instability L > R with overcompensation of hyperextension. Pt also presents with ataxia, utilized 2 # bilateral ankle weights for gait ~120 ft CGA with RW along with forward/back waling with railing for eccentric strength training on inclined surface (ramp). Pt transitioned to squats 2 x 5 in standard form with RW for support and 2 x 5 with R LE positioned on 2 inch step to increase L LE weight bearing and activation. Plan to provide pt with glute bridges for follow up HEP to perform in between therapy sessions. Pt without reports of pain.     Therapy/Group: Individual Therapy  Truitt Leep Truitt Leep PT, DPT  10/08/2023, 12:30 PM

## 2023-10-08 NOTE — Progress Notes (Addendum)
Occupational Therapy Session Note  Patient Details  Name: Jonathan Luna MRN: [id number] Date of Birth: 1965-11-13  Today's Date: 10/08/2023 OT Individual Time: 1107-1206 OT Individual Time Calculation (min): 59 min    Short Term Goals: Week 1:  OT Short Term Goal 1 (Week 1): Pt will complete dynamic standing during ADLs at CGA with LRAD OT Short Term Goal 2 (Week 1): Pt will complete LB dressing at CGA with AE as necessary OT Short Term Goal 3 (Week 1): Pt will complete bathing at [facility] with AE as necessary  Skilled Therapeutic Interventions/Progress Updates:      Therapy Documentation Precautions:  Precautions Precautions: Fall Restrictions Weight Bearing Restrictions Per Provider Order: No General: "You worked me today." Pt seated in W/C upon OT arrival, agreeable to OT.  Pain:  no pain reported  ADL: Pt ambulated ~10 ft to toilet with Min A and no AD, pt educated on safety with not trying to "furniture walk" and risks associated. Pt standing to urinate CGA with unilateral UE support on anterior wall. Pt ambulated back to sink Min A completing hand hygiene without UE support  Exercises: Pt completed the following exercise circuit in order to improve functional activity, strength and endurance to prepare for ADLs such as bathing. Pt completed the following exercises in seated/standing position with no noted LOB/SOB and 2x5 repetitions on each exercise: -triceps dips in W/C, able to clear buttocks off of cushion -modified squats with arms supported, OT told pt to go down until right above W/C cushion and stand back up, pt required occasional Min A for boost to stand fully erect after squat Pt taken outside for exercises in order to improve mood and QoL.   Other Treatments: Pt demonstrated community mobility with W/C in order to prepare for community integration at D/C. Pt propelled in W/C requiring no rest breaks. Pt practices maneuvering into elevators, pulling into table tops,  maneuvering around obstacles and propelling at advanced distances.    Pt seated in W/C at end of session with W/C alarm donned, call light within reach and 4Ps assessed.    Therapy/Group: Individual Therapy  Velia Meyer, OTD, OTR/L 10/08/2023, 12:30 PM

## 2023-10-08 NOTE — Progress Notes (Signed)
Physical Therapy Session Note  Patient Details  Name: Jonathan Luna MRN: [id number] Date of Birth: [birth year]  Today's Date: 10/08/2023 PT Individual Time: 1430-1500 PT Individual Time Calculation (min): 30 min   Short Term Goals: Week 1:  PT Short Term Goal 1 (Week 1): Pt will require CGA with bed<>chair transfer with LRAD PT Short Term Goal 2 (Week 1): Pt will require CGA with gait >100 ft with LRAD PT Short Term Goal 3 (Week 1): Pt will require CGA with navigation of steps x 4  Skilled Therapeutic Interventions/Progress Updates:      Therapy Documentation Precautions:  Precautions Precautions: Fall Restrictions Weight Bearing Restrictions Per Provider Order: No  Pt agreeable to PT session with emphasis on posterior chain strength training to improve stability with gait without AD. Pt with unrated left knee pain, provided repositioning for relief.   Pt dependent for w/c mobility and CGA with squat pivot to mat. Pt performed following gluteal exercises for strength deficits:   -modified left single limb bridge (staggered stance) x 4 with 10 s hold and x 3 with 7# resistance   -left single limb bridge x 5 with 5-10 s hold   Pt attempted transition from short>tall kneeling, deferred due to left knee pain and stiffness therefore PT performed passive SLR and knee to chest for flexiblity training and educated pt on purpose of requiring mobility to strengthen through appropriate range.   Pt CGA/light min A w/c w/c follow for ambulation ~40 ft to return to room without AD with cues for step length and eccentric control as pt with foot slap.   Pt left seated at bedside with all needs in reach.     Therapy/Group: Individual Therapy  Truitt Leep Truitt Leep PT, DPT  10/08/2023, 3:44 PM

## 2023-10-09 ENCOUNTER — Telehealth: Payer: Self-pay | Admitting: Neurology

## 2023-10-09 DIAGNOSIS — I10 Essential (primary) hypertension: Secondary | ICD-10-CM | POA: Diagnosis not present

## 2023-10-09 DIAGNOSIS — E871 Hypo-osmolality and hyponatremia: Secondary | ICD-10-CM | POA: Diagnosis not present

## 2023-10-09 DIAGNOSIS — G610 Guillain-Barre syndrome: Secondary | ICD-10-CM | POA: Diagnosis not present

## 2023-10-09 NOTE — Progress Notes (Addendum)
Physical Therapy Session Note  Patient Details  Name: Jonathan Luna MRN: [id number] Date of Birth: 04/13/1966  Today's Date: 10/09/2023 PT Individual Time: 1400-1446 PT Individual Time Calculation (min): 46 min   Short Term Goals: Week 1:  PT Short Term Goal 1 (Week 1): Pt will require CGA with bed<>chair transfer with LRAD PT Short Term Goal 2 (Week 1): Pt will require CGA with gait >100 ft with LRAD PT Short Term Goal 3 (Week 1): Pt will require CGA with navigation of steps x 4  Skilled Therapeutic Interventions/Progress Updates:   Received pt sitting in WC, pt agreeable to PT treatment, and reported pain 8/10 in low back (premedicated). Session with emphasis on functional mobility/transfers, generalized strengthening and endurance, dynamic standing balance/coordination, NMR, and gait training.   Donned socks and shoes with max A and stood without AD and min A and ambulated ~141ft x 2 trials without AD and min A to/from main therapy gym - cues for heel strike and to widen BOS. Pt worked on the following activities inside // bars with emphasis on glute/quad strength, knee control, dynamic standing balance/coordination, and endurance: - lateral step ups onto 3in step 2x10 bilaterally with BUE support fading to bilateral fingertip support and CGA/min A - forward steps onto 3in step 2x12 bilaterally with BUE support and CGA - lateral monster squats 2x2 laps with BUE support and min A to rise from low squats - forward/backwards tandem walking inside // bars with bilateral fingertip support with CGA Pt required frequent rest/water breaks throughout session but remains extremely motivated to gain strength with primary goal to "be able to walk without a walker". Provided pt with grn TB and instructed in seated hips abduction and hip flexion to work on in between therapies. Returned to room and pt left seated in [facility] with all needs met - pt cleared by RN to propel WC around nurses station to strengthen  LEs.  Therapy Documentation Precautions:  Precautions Precautions: Fall Restrictions Weight Bearing Restrictions Per Provider Order: No  Therapy/Group: Individual Therapy Marlana Salvage Zaunegger Blima Rich PT, DPT 10/09/2023, 7:13 AM

## 2023-10-09 NOTE — Progress Notes (Signed)
Occupational Therapy Session Note  Patient Details  Name: Jonathan Luna MRN: [id number] Date of Birth: 1966/06/23  Today's Date: 10/09/2023 OT Individual Time: 4098-1191 OT Individual Time Calculation (min): 48 min    Short Term Goals: Week 1:  OT Short Term Goal 1 (Week 1): Pt will complete dynamic standing during ADLs at CGA with LRAD OT Short Term Goal 2 (Week 1): Pt will complete LB dressing at CGA with AE as necessary OT Short Term Goal 3 (Week 1): Pt will complete bathing at [facility] with AE as necessary  Skilled Therapeutic Interventions/Progress Updates:      Therapy Documentation Precautions:  Precautions Precautions: Fall Restrictions Weight Bearing Restrictions Per Provider Order: No Session 1 General: "Hello!" Pt seated in W/C upon OT arrival, agreeable to OT.  Pain: no pain reported  ADL: Pt session focus on grooming/shaving and ADL tasks. Pt shaving with Max A d/t weakness in BUE and safety d/t tremors. Pt able to use one hand to brush teeth instead of alternating as opposed to previous session. Pt SBA for oral hygiene seated at sink in W/C and able to manipulate containers and floss picks.    Pt seated in W/C at end of session with W/C alarm donned, call light within reach and 4Ps assessed.    Session 2 General: "I will take all the therapy I can get." Pt seated in W/C upon OT arrival, agreeable to OT.  Pain: no pain reported  ADL:  Toilet transfer: ambulating from W/C next to bed to toilet without AD with Min A-CGA no LOB/SOB Toileting: standing to urinate with CGA using wall for unilateral UE support, although does not need it UB dressing: SBA for doffing/donning overhead shirt in sitting LB dressing: CGA, standing to doff over waist and stepping out with Min A no AD no LOB, sitting to thread over feet and standing to manage over wasit to don shorts after shower Footwear: SBA seated donning/doffing tennis shoes and socks Shower transfer: CGA ambulating with  grab bar from toilet>shower then sitting on TTB Bathing: CGA in standing for peri care in shower, pt standing with leg up to thoroughly wash buttocks area  Exercises:  Pt issued UE theraband HEP in order to increase functional strength, endurance and activity tolerance in order to increase independence in ADLs such as bathing. Pt issued red theraband and discussed direction/technique of exercises, demonstrating verbal understanding. 3x10 exercises listed below: -elbow extensions - shoulder horizontal abduction -bicep curls  -shoulder flexion -diagonal shoulder flexion -external rotation  Pt also issued hand/wrist exercises and educated on technique in order to increase strength/FMC of hand and UE. Exercises include wrist flexion/extension, ulnar and radial deviation and grip/pinch. Pt educated t complete with no weight or with very light weight in order to focus on quality of movement.   Pt seated in W/C at end of session with W/C alarm donned, call light within reach and 4Ps assessed.    Therapy/Group: Individual Therapy  Velia Meyer, OTD, OTR/L 10/09/2023, 4:11 PM

## 2023-10-09 NOTE — Progress Notes (Signed)
Physical Therapy Session Note  Patient Details  Name: Jonathan Luna MRN: [id number] Date of Birth: 05-13-1966  Today's Date: 10/09/2023 PT Individual Time: 1000-1115 PT Individual Time Calculation (min): 75 min   Short Term Goals: Week 1:  PT Short Term Goal 1 (Week 1): Pt will require CGA with bed<>chair transfer with LRAD PT Short Term Goal 2 (Week 1): Pt will require CGA with gait >100 ft with LRAD PT Short Term Goal 3 (Week 1): Pt will require CGA with navigation of steps x 4  Skilled Therapeutic Interventions/Progress Updates:      Therapy Documentation Precautions:  Precautions Precautions: Fall Restrictions Weight Bearing Restrictions Per Provider Order: No  PT session with emphasis on eccentric quad/hamstring strength training and NMR of B UE/LE to address balance deficits. Pt without reports of pain and CGA with toileting and dependent transport by w/c for time to main gym. Pt performed following LE exercises for motor control and strength training:   -backward walking with ~20# weighted vest in parallel bars (CGA)   -backward walking min B HHA with B 4# ankle weights ~70 ft (CGA)   -backward lunges x 3 + x 5 with resistance band to prevent excessive hip abd/ER in parallel bars (min A)   Pt transitioned to UE strength and coordination training as performed flipping of cones involving forearm pronation/supination while wearing bilateral 1.5# ankle weights. Pt presents with greater L UE coordination deficits, plan to continue to address.   Pt returned to room and left seated at bedside with all needs in reach.        Therapy/Group: Individual Therapy  Truitt Leep Truitt Leep PT, DPT  10/09/2023, 12:10 PM

## 2023-10-09 NOTE — Plan of Care (Signed)
  Problem: Consults Goal: RH GENERAL PATIENT EDUCATION Description: See Patient Education module for education specifics. Outcome: Progressing   Problem: RH SAFETY Goal: RH STG ADHERE TO SAFETY PRECAUTIONS W/ASSISTANCE/DEVICE Description: STG Adhere to Safety Precautions With cues Assistance/Device. Outcome: Progressing   Problem: RH PAIN MANAGEMENT Goal: RH STG PAIN MANAGED AT OR BELOW PT'S PAIN GOAL Description: < 4 with prns Outcome: Progressing   Problem: RH KNOWLEDGE DEFICIT GENERAL Goal: RH STG INCREASE KNOWLEDGE OF SELF CARE AFTER HOSPITALIZATION Description: Patient and spouse will be able to manage care at discharge using educational resources for medications and dietary modification independently Outcome: Progressing

## 2023-10-09 NOTE — Progress Notes (Signed)
PROGRESS NOTE   Subjective/Complaints:  Pt up at bedside. Thrilled that he walked with PT in last session yesterday. Had questions about healing process again. Increased gabapentin helping.  ROS: Patient denies fever, rash, sore throat, blurred vision, dizziness, nausea, vomiting, diarrhea, cough, shortness of breath or chest pain,   headache, or mood change.   Objective:   No results found. No results for input(s): "WBC", "HGB", "HCT", "PLT" in the last 72 hours.  No results for input(s): "NA", "K", "CL", "CO2", "GLUCOSE", "BUN", "CREATININE", "CALCIUM" in the last 72 hours.   Intake/Output Summary (Last 24 hours) at 10/09/2023 1131 Last data filed at 10/09/2023 0737 Gross per 24 hour  Intake 240 ml  Output --  Net 240 ml        Physical Exam: Vital Signs Blood pressure (!) 153/88, pulse 64, temperature 97.7 F (36.5 C), temperature source Oral, resp. rate 17, height 5\' 7"  (1.702 m), weight 83.5 kg, SpO2 96%.   Constitutional: No distress . Vital signs reviewed. HEENT: NCAT, EOMI, oral membranes moist Neck: supple Cardiovascular: RRR without murmur. No JVD    Respiratory/Chest: CTA Bilaterally without wheezes or rales. Normal effort    GI/Abdomen: BS +, non-tender, non-distended Ext: no clubbing, cyanosis, or edema Psych: pleasant and cooperative  Skin: No evidence of breakdown, no evidence of rash  MSK:      No apparent deformity.  ?  Mild left upper extremity drop arm, needs to hike shoulder to initiate.      Bilateral second and third digits with decreased range of motion in finger flexion, extension.  No palpable cyst, contracture, or trigger finger.   Neurologic exam:  Cognition: AAO to person, place, time and event.   CN: ? left facial droop as above, otherwise intact. Dysconjugate gaze Coordination: Bilateral upper extremity intention tremors Spasticity: MAS 0 in all extremities.  Strength:                 RUE: 5/5 SA, 5/5 EF, 5/5 EE, 4/5 WE, 3/5 FA, 4/5 FF                LUE:  5/5 SA, 5/5 EF, 5/5 EE, 4/5 WE, 3/5 FA, 4/5 FF                RLE: 5/5 HF, 5/5 KE, 4/5  DF, 2/5  EHL, 4/5  PF                 LLE:  5/5 HF, 5/5 KE, 4/5  DF, 2/5  EHL, 4/5  PF   Sensation intact to LT and Proprio B UE and LE except along fingers and toes/soles of feet. Fair standing balance    Assessment/Plan: 1. Functional deficits which require 3+ hours per day of interdisciplinary therapy in a comprehensive inpatient rehab setting. Physiatrist is providing close team supervision and 24 hour management of active medical problems listed below. Physiatrist and rehab team continue to assess barriers to discharge/monitor patient progress toward functional and medical goals  Care Tool:  Bathing    Body parts bathed by patient: Right arm, Left lower leg, Face, Left arm, Chest, Abdomen, Front perineal area, Buttocks, Right upper leg, Left  upper leg, Right lower leg         Bathing assist Assist Level: Minimal Assistance - Patient > 75%     Upper Body Dressing/Undressing Upper body dressing   What is the patient wearing?: Pull over shirt    Upper body assist Assist Level: Supervision/Verbal cueing    Lower Body Dressing/Undressing Lower body dressing            Lower body assist Assist for lower body dressing: Moderate Assistance - Patient 50 - 74%     Toileting Toileting    Toileting assist Assist for toileting: Contact Guard/Touching assist     Transfers Chair/bed transfer  Transfers assist  Chair/bed transfer activity did not occur: Refused (patient did not get up)  Chair/bed transfer assist level: Minimal Assistance - Patient > 75%     Locomotion Ambulation   Ambulation assist      Assist level: Minimal Assistance - Patient > 75% Assistive device: Walker-rolling Max distance: 338ft   Walk 10 feet activity   Assist     Assist level: Minimal Assistance - Patient >  75% Assistive device: Walker-rolling   Walk 50 feet activity   Assist    Assist level: Minimal Assistance - Patient > 75% Assistive device: Walker-rolling    Walk 150 feet activity   Assist    Assist level: Minimal Assistance - Patient > 75% Assistive device: Walker-rolling    Walk 10 feet on uneven surface  activity   Assist Walk 10 feet on uneven surfaces activity did not occur: Safety/medical concerns (fatigue)         Wheelchair     Assist Is the patient using a wheelchair?: No Type of Wheelchair: Manual    Wheelchair assist level: Dependent - Patient 0% Max wheelchair distance: 200 ft    Wheelchair 50 feet with 2 turns activity    Assist        Assist Level: Dependent - Patient 0%   Wheelchair 150 feet activity     Assist      Assist Level: Dependent - Patient 0%   Blood pressure (!) 153/88, pulse 64, temperature 97.7 F (36.5 C), temperature source Oral, resp. rate 17, height 5\' 7"  (1.702 m), weight 83.5 kg, SpO2 96%.  Medical Problem List and Plan: 1. 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   12/27 we're looking to see if LP can be done here at [facility]            2.  Antithrombotics: -DVT/anticoagulation:  Pharmaceutical: Lovenox             -antiplatelet therapy: N/A   3.Chronic LBP/Pain Management: Continue oxycodone prn-->managed by PCP             --on Gabapentin 100 mg BID for neuropathy -> Per discussion  with patient on admission, ongoing  hypersensitivity on bottom of feet, discussed gabapentin adjustment vs. Lyrica, opted to switch to gabapentin 300 mg nightly.   12/24- will change Gabapentin to 400 mg TID for now- can titrate up more if needed; will also add Robaxin 500 mg q6 hours prn for muscle spasms 12/27- adjusted gabapentin to 600mg  tid yesterday.  4. Mood/Behavior/Sleep: Stable on Zoloft with low dose klonopin prn. LCSW to follow for evaluation and support.              -antipsychotic agents: N/A             --has been using Klonopin for sleep   5. Neuropsych/cognition: This patient is capable of making decisions on his own behalf. 6. Skin/Wound Care: Routine pressure relief measures.  7. Fluids/Electrolytes/Nutrition: Monitor I/O. Check CMET in am 8. HTN: Monitor BP TID--continue  metoprolol and irbesartan  12/27- BP fairly controlled- con't regimen 9. Hyponatremia: resolved     Latest Ref Rng & Units 10/06/2023    4:56 AM 10/01/2023    5:29 AM 09/30/2023    4:18 PM  BMP  Glucose 70 - 99 mg/dL 93  85    BUN 6 - 20 mg/dL 9  11    Creatinine 1.61 - 1.24 mg/dL 0.96  0.45    Sodium 409 - 145 mmol/L 142  133  128   Potassium 3.5 - 5.1 mmol/L 3.9  3.6  4.2   Chloride 98 - 111 mmol/L 108  99    CO2 22 - 32 mmol/L 24  27    Calcium 8.9 - 10.3 mg/dL 9.1  9.0      10. H/o anxiety disorder: Off Buspar as was ineffective. Continue Klonopin bid prn 11. H/o Depression: Managed with low dose Zoloft.  12. Hypothyroid: Continue synthroid 125 mc daily for supplement.   13. Cough/wheezing  12/24- no elevated WBC; Afebrile- will treat Sx's- with Mucinex 600 mg BID, Tessalon pearls scheduled 100 mg TID for 5 days and Albuterol inhaler, per pt request, not nebs-   12/227 seems to have resolved. Chest clear on exam today     LOS: 4 days A FACE TO FACE EVALUATION WAS PERFORMED  Ranelle Oyster 10/09/2023, 11:31 AM

## 2023-10-09 NOTE — Telephone Encounter (Signed)
I discussed with rehab PA Delle Reining, better to complete LP during his hospital stay  Tube 1. Total protein and Glucose Tube 2: Gram stain, fungal stain, VDRL Tube 3:  hold for future Tube 4: Cell count and differentiations.

## 2023-10-09 NOTE — Telephone Encounter (Signed)
Jonathan Luna with [facility] pt is currently in [facility] hospital in room [phone]. She has questions about pt needing a LP. Requesting call back to discuss [phone]

## 2023-10-10 DIAGNOSIS — E871 Hypo-osmolality and hyponatremia: Secondary | ICD-10-CM | POA: Diagnosis not present

## 2023-10-10 DIAGNOSIS — G610 Guillain-Barre syndrome: Secondary | ICD-10-CM | POA: Diagnosis not present

## 2023-10-10 DIAGNOSIS — I10 Essential (primary) hypertension: Secondary | ICD-10-CM | POA: Diagnosis not present

## 2023-10-10 MED ORDER — METHOCARBAMOL 500 MG PO TABS
1000.0000 mg | ORAL_TABLET | Freq: Four times a day (QID) | ORAL | Status: DC | PRN
  Administered 2023-10-11 (×3): 1000 mg via ORAL
  Filled 2023-10-10 (×3): qty 2

## 2023-10-10 NOTE — Progress Notes (Signed)
Physical Therapy Session Note  Patient Details  Name: Jonathan Luna MRN: [id number] Date of Birth: Apr 11, 1966  Today's Date: 10/10/2023 PT Individual Time: 4098-1191 and 4782-9562 PT Individual Time Calculation (min): 45 min and 72 min  Short Term Goals: Week 1:  PT Short Term Goal 1 (Week 1): Pt will require CGA with bed<>chair transfer with LRAD PT Short Term Goal 2 (Week 1): Pt will require CGA with gait >100 ft with LRAD PT Short Term Goal 3 (Week 1): Pt will require CGA with navigation of steps x 4  Skilled Therapeutic Interventions/Progress Updates:   First session:  Pt presents sitting in w/c and agreeable to therapy.  Pt transfers sit to stand w/ CGA.  Pt amb to dayroom w/o AD and min A, cues for L foot "slap".  Pt performs multiple blocks of sit> stand x 5.  Pt states increased "pain"/stretching to popliteal fossa B.  MD aware and K-pad ordered.  Pt performed seated HS stetch and states improvement.  Pt performed standing hooking horseshoes over Bball hoop and then standing on Airex cushion w/ 2 episodes of retropulsion requiring mod A to regain.  Pt returned to room w/ min A and sitting at bedside w/ bed alarm on and all needs in reach.  Second session:  Pt presents semi-reclined in bed and agreeable to therapy, although states fatigue and almost wanted to refuse therapy this PM.  Pt transfers bed > w/c w/ min A and wheeled to main gym for energy conservation.  Pt performed cone obstacle course w/ min A and cues for increasing berth around cones to maintain BOS.  Pt performed sidestepping through cones.  Pt w/ increased pain through popliteal fossa and returned to w/c.  Pt wheeled to small gym and performed UBE at Level 1.3 x 5' forward w/ 3' break and then 2' backwards and resistance decreased to 1.0 for remaining 3'.  Pt wheeled x 150' before fatiguing and PT finished remainder to room.  Pt performed step-pivot to bed and transferred sit to supine w/ supervision.  Bed alarm on and all  needs in reach.  Requested nurse to set-up K-pad for patient at his request.      Therapy Documentation Precautions:  Precautions Precautions: Fall Restrictions Weight Bearing Restrictions Per Provider Order: No General:   Vital Signs: Therapy Vitals Temp: 98.3 F (36.8 C) Temp Source: Oral Pulse Rate: 76 Resp: 18 BP: (!) 133/90 Patient Position (if appropriate): Lying Oxygen Therapy SpO2: 95 % O2 Device: Room Air Pain:8/10 B post leg (lower HS attachment) decreased w/ stretching, 8/10 w/ activity at 2nd session. Pain Assessment Pain Scale: 0-10 Pain Score: 0-No pain Pain Type: Acute pain Pain Location: Leg Pain Orientation: Left;Right Pain Descriptors / Indicators: Burning Pain Intervention(s): Medication (See eMAR)     Therapy/Group: Individual Therapy  [name] 10/10/2023, 8:47 AM

## 2023-10-10 NOTE — Plan of Care (Signed)
  Problem: Consults Goal: RH GENERAL PATIENT EDUCATION Description: See Patient Education module for education specifics. Outcome: Progressing   Problem: RH SAFETY Goal: RH STG ADHERE TO SAFETY PRECAUTIONS W/ASSISTANCE/DEVICE Description: STG Adhere to Safety Precautions With cues Assistance/Device. Outcome: Progressing   Problem: RH PAIN MANAGEMENT Goal: RH STG PAIN MANAGED AT OR BELOW PT'S PAIN GOAL Description: < 4 with prns Outcome: Progressing   Problem: RH KNOWLEDGE DEFICIT GENERAL Goal: RH STG INCREASE KNOWLEDGE OF SELF CARE AFTER HOSPITALIZATION Description: Patient and spouse will be able to manage care at discharge using educational resources for medications and dietary modification independently Outcome: Progressing

## 2023-10-10 NOTE — Progress Notes (Signed)
PROGRESS NOTE   Subjective/Complaints:  Pt eating breakfast. Complains of calf and hamstring tightness particularly in the morning when he first gets up. Asked if he could have a kpad  ROS: Patient denies fever, rash, sore throat, blurred vision, dizziness, nausea, vomiting, diarrhea, cough, shortness of breath or chest pain,   headache, or mood change. .   Objective:   No results found. No results for input(s): "WBC", "HGB", "HCT", "PLT" in the last 72 hours.  No results for input(s): "NA", "K", "CL", "CO2", "GLUCOSE", "BUN", "CREATININE", "CALCIUM" in the last 72 hours.   Intake/Output Summary (Last 24 hours) at 10/10/2023 0919 Last data filed at 10/10/2023 0744 Gross per 24 hour  Intake 480 ml  Output --  Net 480 ml        Physical Exam: Vital Signs Blood pressure (!) 133/90, pulse 76, temperature 98.3 F (36.8 C), temperature source Oral, resp. rate 18, height 5\' 7"  (1.702 m), weight 83.5 kg, SpO2 95%.   Constitutional: No distress . Vital signs reviewed. HEENT: NCAT, EOMI, oral membranes moist Neck: supple Cardiovascular: RRR without murmur. No JVD    Respiratory/Chest: CTA Bilaterally without wheezes or rales. Normal effort    GI/Abdomen: BS +, non-tender, non-distended Ext: no clubbing, cyanosis, or edema Psych: pleasant and cooperative  Skin: No evidence of breakdown, no evidence of rash  MSK:      No apparent deformity.  ?  Mild left upper extremity drop arm, needs to hike shoulder to initiate.      Bilateral second and third digits with decreased range of motion in finger flexion, extension.  No palpable cyst, contracture, or trigger finger.   Neurologic exam:  Cognition: AAO to person, place, time and event.   CN: ? left facial droop as above, otherwise intact. Dysconjugate gaze Coordination: Bilateral upper extremity intention tremors Spasticity: no abnl tone  Strength:                RUE: 5/5  SA, 5/5 EF, 5/5 EE, 4/5 WE, 3/5 FA, 4/5 FF                LUE:  5/5 SA, 5/5 EF, 5/5 EE, 4/5 WE, 3/5 FA, 4/5 FF                RLE: 5/5 HF, 5/5 KE, 4/5  DF, 2/5  EHL, 4/5  PF                 LLE:  5/5 HF, 5/5 KE, 4/5  DF, 2/5  EHL, 4/5  PF   Sensation intact to LT and Proprio B UE and LE except along fingers and toes/soles of feet. No changes    Assessment/Plan: 1. Functional deficits which require 3+ hours per day of interdisciplinary therapy in a comprehensive inpatient rehab setting. Physiatrist is providing close team supervision and 24 hour management of active medical problems listed below. Physiatrist and rehab team continue to assess barriers to discharge/monitor patient progress toward functional and medical goals  Care Tool:  Bathing    Body parts bathed by patient: Right arm, Left lower leg, Face, Left arm, Chest, Abdomen, Front perineal area, Buttocks, Right upper leg, Left  upper leg, Right lower leg         Bathing assist Assist Level: Minimal Assistance - Patient > 75%     Upper Body Dressing/Undressing Upper body dressing   What is the patient wearing?: Pull over shirt    Upper body assist Assist Level: Supervision/Verbal cueing    Lower Body Dressing/Undressing Lower body dressing            Lower body assist Assist for lower body dressing: Moderate Assistance - Patient 50 - 74%     Toileting Toileting    Toileting assist Assist for toileting: Contact Guard/Touching assist     Transfers Chair/bed transfer  Transfers assist  Chair/bed transfer activity did not occur: Refused (patient did not get up)  Chair/bed transfer assist level: Minimal Assistance - Patient > 75%     Locomotion Ambulation   Ambulation assist      Assist level: Minimal Assistance - Patient > 75% Assistive device: No Device Max distance: 180   Walk 10 feet activity   Assist     Assist level: Minimal Assistance - Patient > 75% Assistive device: No Device    Walk 50 feet activity   Assist    Assist level: Minimal Assistance - Patient > 75% Assistive device: No Device    Walk 150 feet activity   Assist    Assist level: Minimal Assistance - Patient > 75% Assistive device: No Device    Walk 10 feet on uneven surface  activity   Assist Walk 10 feet on uneven surfaces activity did not occur: Safety/medical concerns (fatigue)         Wheelchair     Assist Is the patient using a wheelchair?: No Type of Wheelchair: Manual    Wheelchair assist level: Dependent - Patient 0% Max wheelchair distance: 200 ft    Wheelchair 50 feet with 2 turns activity    Assist        Assist Level: Dependent - Patient 0%   Wheelchair 150 feet activity     Assist      Assist Level: Dependent - Patient 0%   Blood pressure (!) 133/90, pulse 76, temperature 98.3 F (36.8 C), temperature source Oral, resp. rate 18, height 5\' 7"  (1.702 m), weight 83.5 kg, SpO2 95%.  Medical Problem List and Plan: 1. 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 Terrace Arabia contacted [name] and I put in order for LP to be performed Monday thereabouts. Spoke to pt about this today 12/28.             2.  Antithrombotics: -DVT/anticoagulation:  Pharmaceutical: Lovenox             -antiplatelet therapy: N/A   3.Chronic LBP/Pain Management: Continue oxycodone prn-->managed by PCP             --on Gabapentin 100 mg BID for neuropathy -> Per discussion with patient on admission, ongoing hypersensitivity on bottom of feet, discussed gabapentin adjustment vs. Lyrica, opted to switch to gabapentin 300 mg nightly.   12/24- will change Gabapentin to 400 mg TID for now- can titrate up more  if needed; will also add Robaxin 500 mg q6 hours prn for muscle  spasms 12/28- adjusted gabapentin to 600mg  tid --it's helping  -will adjust robaxin to 1000mg  q6 prn  -order kpa  -discussed pm and am stretching of calves and hamstrings 4. Mood/Behavior/Sleep: Stable on Zoloft with low dose klonopin prn. LCSW to follow for evaluation and support.              -antipsychotic agents: N/A             --has been using Klonopin for sleep   5. Neuropsych/cognition: This patient is capable of making decisions on his own behalf. 6. Skin/Wound Care: Routine pressure relief measures.  7. Fluids/Electrolytes/Nutrition: Monitor I/O. Check CMET in am 8. HTN: Monitor BP TID--continue  metoprolol and irbesartan  12/28- BP fairly controlled- con't regimen 9. Hyponatremia: resolved     Latest Ref Rng & Units 10/06/2023    4:56 AM 10/01/2023    5:29 AM 09/30/2023    4:18 PM  BMP  Glucose 70 - 99 mg/dL 93  85    BUN 6 - 20 mg/dL 9  11    Creatinine 8.41 - 1.24 mg/dL 3.24  4.01    Sodium 027 - 145 mmol/L 142  133  128   Potassium 3.5 - 5.1 mmol/L 3.9  3.6  4.2   Chloride 98 - 111 mmol/L 108  99    CO2 22 - 32 mmol/L 24  27    Calcium 8.9 - 10.3 mg/dL 9.1  9.0      10. H/o anxiety disorder: Off Buspar as was ineffective. Continue Klonopin bid prn 11. H/o Depression: Managed with low dose Zoloft.  12. Hypothyroid: Continue synthroid 125 mc daily for supplement.   13. Cough/wheezing  12/24- no elevated WBC; Afebrile- will treat Sx's- with Mucinex 600 mg BID, Tessalon pearls scheduled 100 mg TID for 5 days and Albuterol inhaler, per pt request, not nebs-   12/28--sx have largely resolved     LOS: 5 days A FACE TO FACE EVALUATION WAS PERFORMED  Ranelle Oyster 10/10/2023, 9:19 AM

## 2023-10-10 NOTE — Progress Notes (Signed)
Occupational Therapy Session Note  Patient Details  Name: Jonathan Luna MRN: [id number] Date of Birth: 24-May-1966  Upon approach to offer OT Services patient stated he was too fatigued to complete therapy services today and had not had much sleep.  Time was spent building rapport and allowing patient express his concern for the pain in his lower legs today.  This clincian also provided a heated blanket at patient request.         Therapy will be offered next scheduled date.   Resume Plan of Care when patient is able/willing.     Bud Face [facility] 10/10/2023, 4:38 PM

## 2023-10-11 DIAGNOSIS — I10 Essential (primary) hypertension: Secondary | ICD-10-CM | POA: Diagnosis not present

## 2023-10-11 DIAGNOSIS — E871 Hypo-osmolality and hyponatremia: Secondary | ICD-10-CM | POA: Diagnosis not present

## 2023-10-11 DIAGNOSIS — G610 Guillain-Barre syndrome: Secondary | ICD-10-CM | POA: Diagnosis not present

## 2023-10-11 MED ORDER — GABAPENTIN 300 MG PO CAPS
600.0000 mg | ORAL_CAPSULE | Freq: Four times a day (QID) | ORAL | Status: DC
  Administered 2023-10-11 – 2023-10-20 (×36): 600 mg via ORAL
  Filled 2023-10-11 (×37): qty 2

## 2023-10-11 NOTE — Progress Notes (Addendum)
Occupational Therapy Session Note  Patient Details  Name: Jonathan Luna MRN: [id number] Date of Birth: 27-Jul-1966  Today's Date: 10/11/2023 OT Individual Time: 0900-0950 OT Individual Time Calculation (min): 50 min    Short Term Goals: Week 1:  OT Short Term Goal 1 (Week 1): Pt will complete dynamic standing during ADLs at CGA with LRAD OT Short Term Goal 2 (Week 1): Pt will complete LB dressing at CGA with AE as necessary OT Short Term Goal 3 (Week 1): Pt will complete bathing at [facility] with AE as necessary  Skilled Therapeutic Interventions/Progress Updates:    Patient seated EOB at the time of arrival with nursing passing morning meds. Patient indicated that he rested ok , but he has a pain response of 8 on 0-10 with his back.  Patient indicated that nursing was aware. The pt is in agreement with completing UB exercise to improve upon UB strength. The pt was able to use his feet, at w/c LOF for going to the gym.  .  The pt was able to transfer from the w/c LOF using the RW for transferring to the [employer]  for 15 minutes in duration with a resistance LOF ranging from 6-8 performed over 5 minute interval with 1 rest break with each cycle.  The pt went on to complete standing activity involving checkers with 2.5 lb wrist weights in place. Alternating sit to stand. At the close of the session, the pt was transported back to his room and was able to transfer to bed LOF by ambulating to the bed  using the bed rails and additional time. The call light and bedside table was position in front of the pt with all additional needs addressed and the bed alarm activated.   Therapy Documentation Precautions:  Precautions Precautions: Fall Restrictions Weight Bearing Restrictions Per Provider Order: No   Therapy/Group: Individual Therapy  Lavona Mound 10/11/2023, 12:28 PM

## 2023-10-11 NOTE — Progress Notes (Signed)
Physical Therapy Session Note  Patient Details  Name: Jonathan Luna MRN: [id number] Date of Birth: 1966-08-27  Today's Date: 10/11/2023 PT Individual Time: 4696-2952 PT Individual Time Calculation (min): 71 min   Short Term Goals: Week 1:  PT Short Term Goal 1 (Week 1): Pt will require CGA with bed<>chair transfer with LRAD PT Short Term Goal 2 (Week 1): Pt will require CGA with gait >100 ft with LRAD PT Short Term Goal 3 (Week 1): Pt will require CGA with navigation of steps x 4  Skilled Therapeutic Interventions/Progress Updates:      Therapy Documentation Precautions:  Precautions Precautions: Fall Restrictions Weight Bearing Restrictions Per Provider Order: No  PT session with emphasis on NMR and strengthening to improve LE stability as pt presents with impaired coordination. Pt not limited 2/2 pain and requires close (S) for gait ~100 ft with decreased left stance and left foot slap therefore session focused on following hip stability exercises:   -q-ped knee to chest x 10 bilaterally   -q-ped bird dogs x 8 (5# weight on sacrum as cue to prevent tilt) B  -sit<>stand no UE on descending wedge x 6 and with tidal tank with 6 bicep curls (CGA/min A)    -step taps progressed 2 to 6 inch x 6  (CGA/min A)   -L modified SL stance with contralateral punches (min/mod A)   -lateral stepping x 20 ft (B)   -lateral stepping in squat position x 20 ft (B)   Pt returned to room and left seated at bedside with nurse present for medication administration.    Therapy/Group: Individual Therapy  Truitt Leep Truitt Leep PT, DPT  10/11/2023, 12:17 PM

## 2023-10-11 NOTE — Progress Notes (Signed)
PROGRESS NOTE   Subjective/Complaints:  Pt in bed playing video game. Says he's feeling more tingling pain in limbs. Had a good day with therapy yesterday. Mobility improving. Back of legs a little looser with kpad.  ROS: Patient denies fever, rash, sore throat, blurred vision, dizziness, nausea, vomiting, diarrhea, cough, shortness of breath or chest pain, joint or back/neck pain, headache, or mood change.    Objective:   No results found. No results for input(s): "WBC", "HGB", "HCT", "PLT" in the last 72 hours.  No results for input(s): "NA", "K", "CL", "CO2", "GLUCOSE", "BUN", "CREATININE", "CALCIUM" in the last 72 hours.   Intake/Output Summary (Last 24 hours) at 10/11/2023 0941 Last data filed at 10/11/2023 0700 Gross per 24 hour  Intake 960 ml  Output --  Net 960 ml        Physical Exam: Vital Signs Blood pressure 132/85, pulse 79, temperature 97.6 F (36.4 C), temperature source Oral, resp. rate 18, height 5\' 7"  (1.702 m), weight 83.5 kg, SpO2 98%.   Constitutional: No distress . Vital signs reviewed. HEENT: NCAT, EOMI, oral membranes moist Neck: supple Cardiovascular: RRR without murmur. No JVD    Respiratory/Chest: CTA Bilaterally without wheezes or rales. Normal effort    GI/Abdomen: BS +, non-tender, non-distended Ext: no clubbing, cyanosis, or edema Psych: pleasant and cooperative   Skin: No evidence of breakdown, no evidence of rash  MSK:      No apparent deformity.  ?  Mild left upper extremity drop arm, needs to hike shoulder to initiate.      Bilateral second and third digits with decreased range of motion in finger flexion, extension.  No palpable cyst, contracture, or trigger finger.   Neurologic exam:  Cognition: AAO to person, place, time and event.   CN: ? left facial droop as above, otherwise intact. Dysconjugate gaze Coordination: Bilateral upper extremity intention tremors Spasticity:  no abnl tone  Strength:                RUE: 5/5 SA, 5/5 EF, 5/5 EE, 4/5 WE, 3+/5 FA, 4/5 FF                LUE:  5/5 SA, 5/5 EF, 5/5 EE, 4/5 WE, +/5 FA, 4/5 FF                RLE: 5/5 HF, 5/5 KE, 4/5  DF, 235  EHL, 4/5  PF                 LLE:  5/5 HF, 5/5 KE, 4/5  DF, 3/5  EHL, 4/5  PF --motor exam actually better.    Sensation intact to LT and Proprio B UE and LE except along fingers and toes/soles of feet. No changes with sensation    Assessment/Plan: 1. Functional deficits which require 3+ hours per day of interdisciplinary therapy in a comprehensive inpatient rehab setting. Physiatrist is providing close team supervision and 24 hour management of active medical problems listed below. Physiatrist and rehab team continue to assess barriers to discharge/monitor patient progress toward functional and medical goals  Care Tool:  Bathing    Body parts bathed by patient: Right arm, Left  lower leg, Face, Left arm, Chest, Abdomen, Front perineal area, Buttocks, Right upper leg, Left upper leg, Right lower leg         Bathing assist Assist Level: Minimal Assistance - Patient > 75%     Upper Body Dressing/Undressing Upper body dressing   What is the patient wearing?: Pull over shirt    Upper body assist Assist Level: Supervision/Verbal cueing    Lower Body Dressing/Undressing Lower body dressing            Lower body assist Assist for lower body dressing: Moderate Assistance - Patient 50 - 74%     Toileting Toileting    Toileting assist Assist for toileting: Contact Guard/Touching assist     Transfers Chair/bed transfer  Transfers assist  Chair/bed transfer activity did not occur: Refused (patient did not get up)  Chair/bed transfer assist level: Minimal Assistance - Patient > 75%     Locomotion Ambulation   Ambulation assist      Assist level: Minimal Assistance - Patient > 75% Assistive device: No Device Max distance: 180   Walk 10 feet  activity   Assist     Assist level: Minimal Assistance - Patient > 75% Assistive device: No Device   Walk 50 feet activity   Assist    Assist level: Minimal Assistance - Patient > 75% Assistive device: No Device    Walk 150 feet activity   Assist    Assist level: Minimal Assistance - Patient > 75% Assistive device: No Device    Walk 10 feet on uneven surface  activity   Assist Walk 10 feet on uneven surfaces activity did not occur: Safety/medical concerns (fatigue)         Wheelchair     Assist Is the patient using a wheelchair?: Yes Type of Wheelchair: Manual    Wheelchair assist level: Supervision/Verbal cueing Max wheelchair distance: 150    Wheelchair 50 feet with 2 turns activity    Assist        Assist Level: Supervision/Verbal cueing   Wheelchair 150 feet activity     Assist      Assist Level: Supervision/Verbal cueing   Blood pressure 132/85, pulse 79, temperature 97.6 F (36.4 C), temperature source Oral, resp. rate 18, height 5\' 7"  (1.702 m), weight 83.5 kg, SpO2 98%.  Medical Problem List and Plan: 1. 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 Terrace Arabia contacted [name] and I put in order for LP to be performed Monday thereabouts. Spoke to pt about this today 12/28.       -spoke with pt again about presentation. Having more dysesthesias. Otherwise exam is improving. I assured him that we would follow for any changes.        2.  Antithrombotics: -DVT/anticoagulation:  Pharmaceutical: Lovenox             -antiplatelet therapy: N/A   3.Chronic LBP/Pain Management: Continue oxycodone prn-->managed by PCP             --on Gabapentin 100 mg BID for neuropathy -> Per discussion with patient on admission, ongoing  hypersensitivity on  bottom of feet, discussed gabapentin adjustment vs. Lyrica, opted to switch to gabapentin 300 mg nightly.   12/24- will change Gabapentin to 400 mg TID for now- can titrate up more if needed; will also add Robaxin 500 mg q6 hours prn for muscle spasms 12/28- adjusted gabapentin to 600mg  tid --it's helping  -adjusted robaxin to 1000mg  q6 prn  -ordered kpad  -discussed pm and am stretching of calves and hamstrings  12/29--kpad helping   -will increase gabapentin to 600mg  qid   -reassured pt re: presentation, anxiety is an issue here 4. Mood/Behavior/Sleep: Stable on Zoloft with low dose klonopin prn. LCSW to follow for evaluation and support.              -antipsychotic agents: N/A             --has been using Klonopin for sleep   5. Neuropsych/cognition: This patient is capable of making decisions on his own behalf. 6. Skin/Wound Care: Routine pressure relief measures.  7. Fluids/Electrolytes/Nutrition: Monitor I/O. Check CMET in am 8. HTN: Monitor BP TID--continue  metoprolol and irbesartan  12/29- BP fairly controlled- con't regimen 9. Hyponatremia: resolved     Latest Ref Rng & Units 10/06/2023    4:56 AM 10/01/2023    5:29 AM 09/30/2023    4:18 PM  BMP  Glucose 70 - 99 mg/dL 93  85    BUN 6 - 20 mg/dL 9  11    Creatinine 1.61 - 1.24 mg/dL 0.96  0.45    Sodium 409 - 145 mmol/L 142  133  128   Potassium 3.5 - 5.1 mmol/L 3.9  3.6  4.2   Chloride 98 - 111 mmol/L 108  99    CO2 22 - 32 mmol/L 24  27    Calcium 8.9 - 10.3 mg/dL 9.1  9.0      10. H/o anxiety disorder: Off Buspar as was ineffective. Continue Klonopin bid prn 11. H/o Depression: Managed with low dose Zoloft.  12. Hypothyroid: Continue synthroid 125 mc daily for supplement.   13. Cough/wheezing  12/24- no elevated WBC; Afebrile- will treat Sx's- with Mucinex 600 mg BID, Tessalon pearls scheduled 100 mg TID for 5 days and Albuterol inhaler, per pt request, not nebs-   12/29--sx have largely  resolved     LOS: 6 days A FACE TO FACE EVALUATION WAS PERFORMED  Ranelle Oyster 10/11/2023, 9:41 AM

## 2023-10-11 NOTE — Progress Notes (Signed)
Physical Therapy Session Note  Patient Details  Name: Jonathan Luna MRN: [id number] Date of Birth: 1966-07-17  Today's Date: 10/11/2023 PT Individual Time: 0201-0249 PT Individual Time Calculation (min): 48 min   Short Term Goals: Week 1:  PT Short Term Goal 1 (Week 1): Pt will require CGA with bed<>chair transfer with LRAD PT Short Term Goal 2 (Week 1): Pt will require CGA with gait >100 ft with LRAD PT Short Term Goal 3 (Week 1): Pt will require CGA with navigation of steps x 4  Skilled Therapeutic Interventions/Progress Updates:   Pt received supine in bed finishing dietary menu. Pt agreeable to PT tx and reports 3/10 pain.  GT: GT x 98 ft, 2x164 ft with PT CGA and PT verbal cues for decreasing cadence and controlling L foot slap. Pt able to perform. Pt at times ataxic but able to self correct without PT verbal cues.  NMR: Standing NBOS UE reaches various angles, NBOS standing bil shoulder flex/ext various speeds; standing PT facilitated weightshift laterally outside of base of support with PT CGA to min A for safety but with PT verbal cues for pt self-correction and returning to neutral posture. Standing frontal step taps x 15 each side with 1-2 sec balance for each rep with PT CGA for safety. Static standing balance NBOS eyes open/eyes closed x 1 min each bout x multiple bouts with PT light CGA for safety.  Pt left in bed, bed alarm on. Pt requested pain meds from nursing due to it being "time" for them. Pt reports occasional radicular sxs all over his body during session; however, pain did not deter his performance. Pt continues to be ataxic with L LE weakness especially with WB activities.      Therapy Documentation Precautions:  Precautions Precautions: Fall Restrictions Weight Bearing Restrictions Per Provider Order: No  Therapy/Group: Individual Therapy  Luna Fuse 10/11/2023, 7:42 AM

## 2023-10-11 NOTE — Plan of Care (Signed)
  Problem: Consults Goal: RH GENERAL PATIENT EDUCATION Description: See Patient Education module for education specifics. Outcome: Progressing   Problem: RH SAFETY Goal: RH STG ADHERE TO SAFETY PRECAUTIONS W/ASSISTANCE/DEVICE Description: STG Adhere to Safety Precautions With cues Assistance/Device. Outcome: Progressing   Problem: RH PAIN MANAGEMENT Goal: RH STG PAIN MANAGED AT OR BELOW PT'S PAIN GOAL Description: < 4 with prns Outcome: Progressing   Problem: RH KNOWLEDGE DEFICIT GENERAL Goal: RH STG INCREASE KNOWLEDGE OF SELF CARE AFTER HOSPITALIZATION Description: Patient and spouse will be able to manage care at discharge using educational resources for medications and dietary modification independently Outcome: Progressing

## 2023-10-11 NOTE — Progress Notes (Signed)
Occupational Therapy Session Note  Patient Details  Name: Jonathan Luna MRN: [id number] Date of Birth: [birth year]  Today's Date: 10/11/2023 OT Individual Time: 0815-0900 OT Individual Time Calculation (min): 45 min    Short Term Goals: Week 1:  OT Short Term Goal 1 (Week 1): Pt will complete dynamic standing during ADLs at CGA with LRAD OT Short Term Goal 2 (Week 1): Pt will complete LB dressing at CGA with AE as necessary OT Short Term Goal 3 (Week 1): Pt will complete bathing at [facility] with AE as necessary  Skilled Therapeutic Interventions/Progress Updates:      Therapy Documentation Precautions:  Precautions Precautions: Fall Restrictions Weight Bearing Restrictions Per Provider Order: No Session 1 General: "I am glad you picked me up fro extra! Pt supine in bed upon OT arrival, agreeable to OT session.  Pain:  7/10 pain reported in calves and low back, activity, intermittent rest breaks, distractions provided for pain management, pt reports tolerable to proceed. Pt has k pad on calves upon OT arrival for pain relief.  ADL: Pt ambulated to toilet Min A emerging CGA with no AD from bed ~10 ft. Pt standing to urinate and manage pants CGA. Pt ambulated out of bathroom Min A then standing at sink for hand hygiene CGA. Pt then ambulated from room><therapy gym CGA no AD and no LOB/SOB.  Exercises: Pt seated EOM completing various hamstring stretches with OT manually facilitating stretching d/t reported hamstring tightness. Pt also reported not being able to complete calf raises, so seated EOM pt completed 1x30 calf raises with 2 sec hold, pt with good calf ROM initially, fading d/t leg fatigue.  Pt completed 4 minutes of nu step bike in order to increase BUE/BLEstrength and endurance in preparation for increased independence in ADLs such as functional transfers. No Rest break on level 6 then increasing to 10, decrease back to 7 resistance.   Other Treatments: Pt completing catch/release  exercises with stress ball in room for increased hand/eye coordination and purposeful movements and BUE coordination. Pt with difficulty coordinating LUE and noting increased wrist weakness on LUE. Pt completed multiple trials of activity with increased difficulty with LUE.    Pt seated in W/C at end of session with W/C alarm donned, call light within reach and 4Ps assessed.    Session 2 General: Thank you! Pt seated in EOB with nurse tech completing dressing after shower upon OT arrival, agreeable to OT.  Pain: no pain reported  ADL: Pt completing dressing tasks SBA seated EOB and standing to manage pants over waist. Pt able to don socks seated EOB SBA for first time, even able to manipulate sock with Rt hand.   Exercises: Pt participating in various activities targeting Rt wrist and purposeful grasp and release while stabilizing Rt wrist. Pt with increased difficulty stabilizing wrist with increased weight items such as iPad. Pt also participating in isolated wrist flexion/extension activities.   Pt supine in bed with bed alarm activated, 2 bed rails up, call light within reach and 4Ps assessed.    Therapy/Group: Individual Therapy  Velia Meyer, OTD, OTR/L 10/11/2023, 7:22 PM

## 2023-10-12 ENCOUNTER — Inpatient Hospital Stay (HOSPITAL_COMMUNITY): Payer: 59

## 2023-10-12 DIAGNOSIS — G610 Guillain-Barre syndrome: Secondary | ICD-10-CM | POA: Diagnosis not present

## 2023-10-12 LAB — MENINGITIS/ENCEPHALITIS PANEL (CSF)

## 2023-10-12 LAB — CSF CELL COUNT WITH DIFFERENTIAL
Eosinophils, CSF: 1 % (ref 0–1)
Lymphs, CSF: 84 % — ABNORMAL HIGH (ref 40–80)
Monocyte-Macrophage-Spinal Fluid: 14 % — ABNORMAL LOW (ref 15–45)
RBC Count, CSF: 1 /mm3 — ABNORMAL HIGH
Segmented Neutrophils-CSF: 1 % (ref 0–6)
Tube #: 4
WBC, CSF: 12 /mm3 (ref 0–5)

## 2023-10-12 LAB — BASIC METABOLIC PANEL
Anion gap: 10 (ref 5–15)
BUN: 10 mg/dL (ref 6–20)
CO2: 25 mmol/L (ref 22–32)
Calcium: 9.4 mg/dL (ref 8.9–10.3)
Chloride: 102 mmol/L (ref 98–111)
Creatinine, Ser: 0.79 mg/dL (ref 0.61–1.24)
GFR, Estimated: >60 mL/min (ref >60)
Glucose, Bld: 86 mg/dL (ref 70–99)
Potassium: 4.5 mmol/L (ref 3.5–5.1)
Sodium: 137 mmol/L (ref 135–145)

## 2023-10-12 LAB — CBC
HCT: 41.3 % (ref 39.0–52.0)
Hemoglobin: 14.1 g/dL (ref 13.0–17.0)
MCH: 30.9 pg (ref 26.0–34.0)
MCHC: 34.1 g/dL (ref 30.0–36.0)
MCV: 90.6 fL (ref 80.0–100.0)
Platelets: 378 10*3/uL (ref 150–400)
RBC: 4.56 MIL/uL (ref 4.22–5.81)
RDW: 13.4 % (ref 11.5–15.5)
WBC: 7.2 10*3/uL (ref 4.0–10.5)
nRBC: 0 % (ref 0.0–0.2)

## 2023-10-12 LAB — PROTEIN AND GLUCOSE, CSF
Glucose, CSF: 53 mg/dL (ref 40–70)
Total  Protein, CSF: 139 mg/dL — ABNORMAL HIGH (ref 15–45)

## 2023-10-12 MED ORDER — ENOXAPARIN SODIUM 40 MG/0.4ML IJ SOSY: 40.0000 mg | PREFILLED_SYRINGE | INTRAMUSCULAR | Status: DC

## 2023-10-12 MED ORDER — LIDOCAINE 1 % OPTIME INJ - NO CHARGE
5.0000 mL | Freq: Once | INTRAMUSCULAR | Status: AC
  Administered 2023-10-12: 5 mL
  Filled 2023-10-12: qty 6

## 2023-10-12 MED ORDER — ENOXAPARIN SODIUM 40 MG/0.4ML IJ SOSY
40.0000 mg | PREFILLED_SYRINGE | INTRAMUSCULAR | Status: DC
  Administered 2023-10-13 – 2023-10-20 (×8): 40 mg via SUBCUTANEOUS
  Filled 2023-10-12 (×8): qty 0.4

## 2023-10-12 NOTE — Progress Notes (Signed)
NIF is greater than -40 cm H2O with excellent effort and understanding by patient.

## 2023-10-12 NOTE — Progress Notes (Signed)
CSF with WBC no organisms and negative meningitis/encephalitis panel. Discussed with Dr. Wynn Banker. Left message for Dr. Terrace Arabia that LP done today and CSF w/WBC neg organisms.   Stable NIF greater than -40 H2O.

## 2023-10-12 NOTE — Progress Notes (Signed)
Occupational Therapy Session Note  Patient Details  Name: Jonathan Luna MRN: [id number] Date of Birth: 04-26-1966  Today's Date: 10/12/2023 OT Individual Time: 4098-1191 OT Individual Time Calculation (min): 55 min    Short Term Goals: Week 1:  OT Short Term Goal 1 (Week 1): Pt will complete dynamic standing during ADLs at CGA with LRAD OT Short Term Goal 2 (Week 1): Pt will complete LB dressing at CGA with AE as necessary OT Short Term Goal 3 (Week 1): Pt will complete bathing at [facility] with AE as necessary  Skilled Therapeutic Interventions/Progress Updates:  Pt greeted resting in bed on a personal call, updating spouse on MD rounding. Skilled OT session with focus on dynamic standing balance, activity tolerance, and BUE coordination.   Pain: Pt with un-rated pain in B-calves from spams/cramps, OT offering intermediate rest breaks and positioning suggestions throughout session to address pain/fatigue and maximize participation/safety in session.   Functional Transfers: Pt performs multiple sit<>stands with CGA, ambulating from into bathroom, and room>day room with same level of assistance.   Self Care Tasks: Pt toilets with CGA, continent of bladder void.   Therapeutic Activities: Pt instructed in functional mobility activity, while challenging patient to place rings onto colored cones with L-hand for coordination. Pt initiates activity with CGA, requiring Min A HHA with fatigue, x1 instance of posterior LOB onto EOM.   Pt completes 3x10 reps of calf raises at gentle/slow pace for B calf stretching. Pt provided with roller to use outside of therapy sessions for self massage.    Education: Patient presents with decent number of questions in regards to diagnosis and prognosis. OT provides education within scope of practice, emphasis placed on energy conservation and extended recovery time after extraneous activity.   Pt remained sitting in WC with 4Ps assessed and immediate needs met. Pt  continues to be appropriate for skilled OT intervention to promote further functional independence in ADLs/IADLs.   Therapy Documentation Precautions:  Precautions Precautions: Fall Restrictions Weight Bearing Restrictions Per Provider Order: No  Therapy/Group: Individual Therapy  Lou Cal, OTR/L, MSOT  10/12/2023, 6:17 AM

## 2023-10-12 NOTE — Progress Notes (Signed)
Vickie Epley from radiology will get patient consent for lumbar puncture

## 2023-10-12 NOTE — Progress Notes (Signed)
Occupational Therapy Session Note  Patient Details  Name: Jonathan Luna MRN: [id number] Date of Birth: 31-Jul-1966  Today's Date: 10/12/2023 OT Individual Time: 1016-1110 OT Individual Time Calculation (min): 54 min    Short Term Goals: Week 1:  OT Short Term Goal 1 (Week 1): Pt will complete dynamic standing during ADLs at CGA with LRAD OT Short Term Goal 2 (Week 1): Pt will complete LB dressing at CGA with AE as necessary OT Short Term Goal 3 (Week 1): Pt will complete bathing at [facility] with AE as necessary  Skilled Therapeutic Interventions/Progress Updates:    Patient agreeable to participate in OT session. Reports 0/10 pain level.   Patient participated in skilled OT session focusing on BUE fine motor coordination, pt education, and compensatory techniques for ADL tasks utilizing AE.   Pt provided with elastic shoelaces to increase functional performance during LB dressing as Pt demonstrated max difficulty when attempting to manage shoelaces.  Pt participated in fine motor coordination task which included removing original laces and lacing new elastic shoelaces. Required Min A to lace shoes. Pt demonstrated independence with donning shoes on bilateral feet.   Bilateral wrist supports provided to allow pt to maintain wrist extension when performing tasks such as reaching for drink on tray table. Once wrist supports were donned, pt was able to retrieve drinking cup using either hand with wrist extension ~30* allowing him to maintain appropriate grasp during task.   Pt provided information via email on adaptive nail clippers for decreased grasp and/or neuropathy.    Therapy Documentation Precautions:  Precautions Precautions: Fall Restrictions Weight Bearing Restrictions Per Provider Order: No  Therapy/Group: Individual Therapy  Limmie Patricia, OTR/L,CBIS  Supplemental OT - [facility] and [facility] Secure Chat Preferred   10/12/2023, 11:48 AM

## 2023-10-12 NOTE — Progress Notes (Signed)
Physical Therapy Session Note  Patient Details  Name: Jonathan Luna MRN: [id number] Date of Birth: 03-31-1966  Today's Date: 10/12/2023 PT Missed Time: 75 Minutes Missed Time Reason: MD hold (Comment) (bed rest following lumbar puncture)  Short Term Goals: Week 1:  PT Short Term Goal 1 (Week 1): Pt will require CGA with bed<>chair transfer with LRAD PT Short Term Goal 2 (Week 1): Pt will require CGA with gait >100 ft with LRAD PT Short Term Goal 3 (Week 1): Pt will require CGA with navigation of steps x 4  Skilled Therapeutic Interventions/Progress Updates:      Therapy Documentation Precautions:  Precautions Precautions: Fall Restrictions Weight Bearing Restrictions Per Provider Order: No General: PT Amount of Missed Time (min): 75 Minutes PT Missed Treatment Reason: MD hold (Comment) (bed rest following lumbar puncture)  Pt placed on medical hold following lumbar puncture x 4 hours, missed 75 minutes of skilled PT, plan to make up when able.     Therapy/Group: Individual Therapy  Truitt Leep Truitt Leep PT, DPT  10/12/2023, 3:48 PM

## 2023-10-12 NOTE — Telephone Encounter (Signed)
Called Pam back not realizing that Dr Terrace Arabia had already spoke with her and gave instructions. She states nothing else is needed from our end and appreciated the call back.

## 2023-10-12 NOTE — Telephone Encounter (Signed)
Rinaldo Cloud love pa-c stated that the result showed 12 wbcs. She wanted dr. Terrace Arabia to be on the lookout for results

## 2023-10-12 NOTE — Progress Notes (Signed)
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    ROS:  Pt denies SOB, abd pain, CP, N/V/C/D, and vision changes  Objective:   No results found. Recent Labs    10/12/23 0552  WBC 7.2  HGB 14.1  HCT 41.3  PLT 378    Recent Labs    10/12/23 0552  NA 137  K 4.5  CL 102  CO2 25  GLUCOSE 86  BUN 10  CREATININE 0.79  CALCIUM 9.4     Intake/Output Summary (Last 24 hours) at 10/12/2023 1306 Last data filed at 10/12/2023 0738 Gross per 24 hour  Intake 720 ml  Output --  Net 720 ml        Physical Exam: Vital Signs Blood pressure 111/81, pulse 74, temperature 98.2 F (36.8 C), temperature source Oral, resp. rate 18, height 5\' 7"  (1.702 m), weight 83.5 kg, SpO2 93%.     General: awake, alert, appropriate, sitting up in bed; NAD HENT: conjugate gaze; oropharynx moist CV: regular rate and rhythm; no JVD Pulmonary: CTA B/L; no W/R/R- good air movement GI: soft, NT, ND, (+)BS Psychiatric: anxious somewhat Neurological: Ox3 RUE- biceps/triceps 5/5; WE 4-/5; Grip 4/5; FA 2+/5 LUE- biceps/triceps 5/5; WE 3+/5; Grip 4-/5 and FA 2+/5 LE's- HF/KE 5-/5; DF 4-/5; and PF 4/5 MSK- tight calves- B/L Ext: no clubbing, cyanosis, or edema Psych: pleasant and cooperative   Skin: No evidence of breakdown, no evidence of rash  MSK:      No apparent deformity.  ?  Mild left upper extremity drop arm, needs to hike shoulder to initiate.      Bilateral second and third digits with decreased range of motion in finger flexion, extension.  No palpable cyst, contracture, or trigger finger.   Neurologic exam:  Cognition:  AAO to person, place, time and event.   CN: ? left facial droop as above, otherwise intact. Dysconjugate gaze Coordination: Bilateral upper extremity intention tremors Spasticity: no abnl tone  Strength:                RUE: 5/5 SA, 5/5 EF, 5/5 EE, 4/5 WE, 3+/5 FA, 4/5 FF                LUE:  5/5 SA, 5/5 EF, 5/5 EE, 4/5 WE, +/5 FA, 4/5 FF                RLE: 5/5 HF, 5/5 KE, 4/5  DF, 235  EHL, 4/5  PF                 LLE:  5/5 HF, 5/5 KE, 4/5  DF, 3/5  EHL, 4/5  PF --motor exam actually better.    Sensation intact to LT and Propri[employer] B UE and LE except along fingers and toes/soles of feet. No changes with sensation    Assessment/Plan: 1. Functional deficits which require 3+  hours per day of interdisciplinary therapy in a comprehensive inpatient rehab setting. Physiatrist is providing close team supervision and 24 hour management of active medical problems listed below. Physiatrist and rehab team continue to assess barriers to discharge/monitor patient progress toward functional and medical goals  Care Tool:  Bathing    Body parts bathed by patient: Right arm, Left lower leg, Face, Left arm, Chest, Abdomen, Front perineal area, Buttocks, Right upper leg, Left upper leg, Right lower leg         Bathing assist Assist Level: Minimal Assistance - Patient > 75%     Upper Body Dressing/Undressing Upper body dressing   What is the patient wearing?: Pull over shirt    Upper body assist Assist Level: Supervision/Verbal cueing    Lower Body Dressing/Undressing Lower body dressing            Lower body assist Assist for lower body dressing: Moderate Assistance - Patient 50 - 74%     Toileting Toileting    Toileting assist Assist for toileting: Contact Guard/Touching assist     Transfers Chair/bed transfer  Transfers assist  Chair/bed transfer activity did not occur: Refused (patient did not get up)  Chair/bed transfer assist level: Minimal Assistance - Patient > 75%      Locomotion Ambulation   Ambulation assist      Assist level: Minimal Assistance - Patient > 75% Assistive device: No Device Max distance: 180   Walk 10 feet activity   Assist     Assist level: Minimal Assistance - Patient > 75% Assistive device: No Device   Walk 50 feet activity   Assist    Assist level: Minimal Assistance - Patient > 75% Assistive device: No Device    Walk 150 feet activity   Assist    Assist level: Minimal Assistance - Patient > 75% Assistive device: No Device    Walk 10 feet on uneven surface  activity   Assist Walk 10 feet on uneven surfaces activity did not occur: Safety/medical concerns (fatigue)         Wheelchair     Assist Is the patient using a wheelchair?: Yes Type of Wheelchair: Manual    Wheelchair assist level: Supervision/Verbal cueing Max wheelchair distance: 150    Wheelchair 50 feet with 2 turns activity    Assist        Assist Level: Supervision/Verbal cueing   Wheelchair 150 feet activity     Assist      Assist Level: Supervision/Verbal cueing   Blood pressure 111/81, pulse 74, temperature 98.2 F (36.8 C), temperature source Oral, resp. rate 18, height 5\' 7"  (1.702 m), weight 83.5 kg, SpO2 93%.  Medical Problem List and Plan: 1. 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 Terrace Arabia contacted [name] and I put in order for LP to be performed Monday thereabouts. Spoke to pt about this today 12/28.       -12/30- pt still feels Sx's are getting worse- it sounds like he's been overdoing, which can exhaust the muscles- explained will check strength daily for now in specific muscles and monitor-  if getting worse, will call Neurology back.     Pt not getting NIFs/VC daily that I can see- no  documentation or order seen- will d/w team.    2.  Antithrombotics: -DVT/anticoagulation:  Pharmaceutical: Lovenox             -antiplatelet therapy: N/A   3.Chronic LBP/Pain Management: Continue oxycodone prn-->managed by PCP             --on Gabapentin 100 mg BID for neuropathy -> Per discussion with patient on admission, ongoing hypersensitivity on bottom of feet, discussed gabapentin adjustment vs. Lyrica, opted to switch to gabapentin 300 mg nightly.   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 Mood/Behavior/Sleep: Stable on Zoloft with low dose klonopin prn. LCSW to follow for evaluation and support.              -antipsychotic agents: N/A             --has been using Klonopin for sleep   5. Neuropsych/cognition: This patient is capable of making decisions on his own behalf. 6. Skin/Wound Care: Routine pressure relief measures.  7. Fluids/Electrolytes/Nutrition: Monitor I/O. Check CMET in am 8. HTN: Monitor BP TID--continue  metoprolol and irbesartan  12/29- BP fairly controlled- con't regimen  12/30- BP controlled- con't regimen 9. Hyponatremia: resolved     Latest Ref Rng & Units 10/12/2023    5:52 AM 10/06/2023    4:56 AM 10/01/2023    5:29 AM  BMP  Glucose 70 - 99 mg/dL 86  93  85   BUN 6 - 20 mg/dL 10  9  11    Creatinine 0.61 - 1.24 mg/dL 1.61  0.96  0.45   Sodium 135 - 145 mmol/L 137  142  133   Potassium 3.5 - 5.1 mmol/L 4.5  3.9  3.6   Chloride  98 - 111 mmol/L 102  108  99   CO2 22 - 32 mmol/L 25  24  27    Calcium 8.9 - 10.3 mg/dL 9.4  9.1  9.0     10. H/o anxiety disorder: Off Buspar as was ineffective. Continue Klonopin bid prn 11. H/o Depression: Managed with low dose Zoloft.  12. Hypothyroid: Continue synthroid 125 mc daily for supplement.   13. Cough/wheezing  12/24- no elevated WBC; Afebrile- will treat Sx's- with Mucinex 600 mg BID, Tessalon pearls scheduled 100 mg TID for 5 days and Albuterol inhaler, per pt request, not nebs-   12/29--sx have largely resolved   I spent a total of  53  minutes on total care today- >50% coordination of care- due to  D/w pt about feeling weaker, my concerns that might have had reduction in strength- and possible options for additional treatment. Also d/w pt about LP and with team about plans for LP  LOS: 7 days A FACE TO FACE EVALUATION WAS PERFORMED  Zaelynn Fuchs 10/12/2023, 1:06 PM

## 2023-10-12 NOTE — Progress Notes (Signed)
Physical Therapy Session Note  Patient Details  Name: Jonathan Luna MRN: [id number] Date of Birth: 1966/02/27  Today's Date: 10/12/2023 PT Individual Time: 3086-5784 PT Individual Time Calculation (min): 38 min   Short Term Goals: Week 1:  PT Short Term Goal 1 (Week 1): Pt will require CGA with bed<>chair transfer with LRAD PT Short Term Goal 2 (Week 1): Pt will require CGA with gait >100 ft with LRAD PT Short Term Goal 3 (Week 1): Pt will require CGA with navigation of steps x 4  Skilled Therapeutic Interventions/Progress Updates:      Therapy Documentation Precautions:  Precautions Precautions: Fall Restrictions Weight Bearing Restrictions Per Provider Order: No  Pt received in care of OT, PT present for handoff. Pt with unrated right gastroc pain therefore PT session with emphasis on trigger point, active assist soft tissue mobilization and pain management. Pt provided with lacrosse ball and instructed to perform trigger point release and PT followed up with manual trigger point therapy to affected area, following intervention pt reported improvement with gait and able to ambulate ~30 ft x 2 with less discomfort. Pt engaged in discussion with PT regarding energy conservation and therapist encouraged patient track RPE following each session to increase body awareness, pt agreeable to try. Pt returned to room and left semi-reclined in bed with all needs in reach.     Therapy/Group: Individual Therapy  Truitt Leep Truitt Leep PT, DPT  10/12/2023, 12:20 PM

## 2023-10-12 NOTE — Plan of Care (Signed)
  Problem: Consults Goal: RH GENERAL PATIENT EDUCATION Description: See Patient Education module for education specifics. Outcome: Progressing   Problem: RH SAFETY Goal: RH STG ADHERE TO SAFETY PRECAUTIONS W/ASSISTANCE/DEVICE Description: STG Adhere to Safety Precautions With cues Assistance/Device. Outcome: Progressing   Problem: RH PAIN MANAGEMENT Goal: RH STG PAIN MANAGED AT OR BELOW PT'S PAIN GOAL Description: < 4 with prns Outcome: Progressing   Problem: RH KNOWLEDGE DEFICIT GENERAL Goal: RH STG INCREASE KNOWLEDGE OF SELF CARE AFTER HOSPITALIZATION Description: Patient and spouse will be able to manage care at discharge using educational resources for medications and dietary modification independently Outcome: Progressing

## 2023-10-12 NOTE — Progress Notes (Signed)
Successful LP from L4-L5 No complications.  Brayton El PA-C Interventional Radiology 10/12/2023 1:23 PM

## 2023-10-12 NOTE — Progress Notes (Signed)
Lab called to report critical value of CSF which included white blood cell count of 12. Lab value reported by this RN by phone call to Delle Reining PA. No new orders at this time.

## 2023-10-13 ENCOUNTER — Inpatient Hospital Stay
Admission: RE | Admit: 2023-10-13 | Discharge: 2023-10-13 | Disposition: A | Discharge status: Home / Self Care | Payer: 59 | Source: Ambulatory Visit | Attending: Neurology | Admitting: Neurology

## 2023-10-13 DIAGNOSIS — G610 Guillain-Barre syndrome: Secondary | ICD-10-CM | POA: Diagnosis not present

## 2023-10-13 LAB — GANGLIOSIDE ABS, IGG/IGM
GD1b Antibody, IgG: 4 IV (ref 0–50)
GD1b Antibody, IgM: 67 IV — ABNORMAL HIGH (ref 0–50)
GM1 Antibody, IgG: 3 IV (ref 0–50)
GM1 Antibody, IgM: 10 IV (ref 0–50)
GQ1b Antibody, IgG: 3 IV (ref 0–50)
GQ1b Antibody, IgM: 3 IV (ref 0–50)

## 2023-10-13 MED ORDER — TIZANIDINE HCL 4 MG PO TABS
4.0000 mg | ORAL_TABLET | Freq: Two times a day (BID) | ORAL | Status: DC
  Administered 2023-10-13 – 2023-10-20 (×14): 4 mg via ORAL
  Filled 2023-10-13 (×14): qty 1

## 2023-10-13 NOTE — Progress Notes (Signed)
NIF greater than -40 and VC 2.6 L. Great pt effort.

## 2023-10-13 NOTE — Progress Notes (Signed)
 Occupational Therapy Weekly Progress Note  Patient Details  Name: Jonathan Luna MRN: [id number] Date of Birth: August 25, 1966  Beginning of progress report period: October 06, 2023 End of progress report period: October 13, 2023  Today's Date: 10/13/2023 OT Individual Time: 0900-1015 OT Individual Time Calculation (min): 75 min    Patient has met 3 of 3 short term goals.  Pt has demonstrated increased ability to complete ADLs and is progressing toward LTG. Pt with ataxia in BUE and working on managing as well as strengthening BUE for increased functional grasp and release. Cleared to do e-stim on wrists for increased wrist extension, especially for Lt hand.   Patient continues to demonstrate the following deficits: muscle weakness, decreased cardiorespiratoy endurance, impaired timing and sequencing, abnormal tone, unbalanced muscle activation, motor apraxia, ataxia, decreased coordination, and decreased motor planning, and decreased standing balance, decreased postural control, and decreased balance strategies and therefore will continue to benefit from skilled OT intervention to enhance overall performance with BADL and Reduce care partner burden.  Patient progressing toward long term goals..  Continue plan of care.  OT Short Term Goals Week 1:  OT Short Term Goal 1 (Week 1): Pt will complete dynamic standing during ADLs at CGA with LRAD OT Short Term Goal 1 - Progress (Week 1): Met OT Short Term Goal 2 (Week 1): Pt will complete LB dressing at CGA with AE as necessary OT Short Term Goal 2 - Progress (Week 1): Met OT Short Term Goal 3 (Week 1): Pt will complete bathing at [facility] with AE as necessary OT Short Term Goal 3 - Progress (Week 1): Met Week 2:  OT Short Term Goal 1 (Week 2): STG=LTG d/t ELOS  Skilled Therapeutic Interventions/Progress Updates:      Therapy Documentation Precautions:  Precautions Precautions: Fall Restrictions Weight Bearing Restrictions Per Provider Order:  No General: "You're back!" Pt supine in bed upon OT arrival, agreeable to OT session.  Pain:  increased pain reported in BLE calves, activity, intermittent rest breaks, distractions provided for pain management, pt reports tolerable to proceed.   ADL: Bed mobility: SBA supine>EOB with semi raised HOB Grooming: SBA for shaving while seated, pt able to manipulate razor in safe manner with RUE in order to shave face, assistance required to squeeze button for shaving cream  Toilet transfer: CGA ambulating from bed>toilet without AD no LOB/SOB Toileting: CGA, sitting for BM, able to complete pants management, hygiene and flushing toilet in standing UB dressing: SBA seated on TTB doffing/donning overhead shirt LB dressing: CGA in standing to manage over waist, seated on TTB to thread over feet  Shower transfer: CGA toilet>shower ambulating without AD no LOB Bathing: Transfers: CGA, initial sit to stand Min A d/t increased pain in calves this date, all other transfers CGA with VC not to reach for furniture for increased balance  Other Treatments: Pt encouraged to rest after therapy sessions d/t increased muscle fatigue for increased muscle recovery.    Pt seated in W/C at end of session with W/C alarm donned, call light within reach and 4Ps assessed.    Therapy/Group: Individual Therapy Camie Hoe, OTD, OTR/L 10/13/2023, 12:43 PM

## 2023-10-13 NOTE — Progress Notes (Signed)
 Physical Therapy Weekly Progress Note  Patient Details  Name: Jonathan Luna MRN: [id number] Date of Birth: Jun 05, 1966  Beginning of progress report period: October 06, 2023 End of progress report period: October 13, 2023  Today's Date: 10/13/2023 PT Individual Time: 1417-1530 PT Individual Time Calculation (min): 73 min   Patient has met 3 of 3 short term goals.  Pt making steady progress towards long term goals largely requiring supervision for bed mobility, CGA/close supervision for transfers and gait >150 ft without AD. Pt limited 2/2 balance/coordination deficits, plan to continue to address to increase safety prior to discharge with functional mobility.   Patient continues to demonstrate the following deficits unbalanced muscle activation, decreased coordination, and decreased motor planning and decreased standing balance and decreased postural control and therefore will continue to benefit from skilled PT intervention to increase functional independence with mobility.  Patient progressing toward long term goals..  Continue plan of care.  PT Short Term Goals Week 1:  PT Short Term Goal 1 (Week 1): Pt will require CGA with bed<>chair transfer with LRAD PT Short Term Goal 1 - Progress (Week 1): Met PT Short Term Goal 2 (Week 1): Pt will require CGA with gait >100 ft with LRAD PT Short Term Goal 2 - Progress (Week 1): Met PT Short Term Goal 3 (Week 1): Pt will require CGA with navigation of steps x 4 PT Short Term Goal 3 - Progress (Week 1): Met Week 2:  PT Short Term Goal 1 (Week 2): STG's=LTG's due to ELOS  Skilled Therapeutic Interventions/Progress Updates:      Therapy Documentation Precautions:  Precautions Precautions: Fall Restrictions Weight Bearing Restrictions Per Provider Order: No  PT session with emphasis on low back flexibility and NMR with dynamic gait and balance training. Pt with unrated back pain and pt instructed in swiss ball walks out in forward and lateral  direction for paraspinal and QL flexibility. Pt transitioned to NMR with agility ladder training involving forward and lateral stepping at increased pace with various combinations (CGA/min A). Pt returned to room and left semi-reclined in bed with all needs in reach.    Therapy/Group: Individual Therapy  [name] [name] PT, DPT  10/13/2023, 3:53 PM

## 2023-10-13 NOTE — Progress Notes (Signed)
 Occupational Therapy Session Note  Patient Details  Name: Jonathan Luna MRN: [id number] Date of Birth: Feb 18, 1966  Today's Date: 10/13/2023 OT Individual Time: 1105-1205 OT Individual Time Calculation (min): 60 min    Short Term Goals: Week 1:  OT Short Term Goal 1 (Week 1): Pt will complete dynamic standing during ADLs at CGA with LRAD OT Short Term Goal 1 - Progress (Week 1): Met OT Short Term Goal 2 (Week 1): Pt will complete LB dressing at CGA with AE as necessary OT Short Term Goal 2 - Progress (Week 1): Met OT Short Term Goal 3 (Week 1): Pt will complete bathing at [facility] with AE as necessary OT Short Term Goal 3 - Progress (Week 1): Met  Skilled Therapeutic Interventions/Progress Updates: Patient received sitting up in his w/c, wife present. Patient agreeable and motivated to work with OT. Patient reports being concerned about feeling weaker today. Discussed GBS and patient concerns as well as progress from initial dx and IVIG treatment. Patient given grip strength assessment to allow for baseline measurements to compare improving strength in a measurable way. Patient glad to have specific numbers for gaging improvement in the future. Patient wheeled himself to the therapy gym for strength and stability exercises. Patient transferred to the mat with SPV. Transitioned from sitting EOM to stomach without assist. Quadruped stability tasks working to wt shift over BUE for proximal stability and scap work. Followed with reaching in quadruped focusing on keeping trunk stable. Able to tolerate sets of 5 to each side beofer triceps fatigues and patient returned to prone on mat for rest. Performed continued quadruped stability with ipsilateral extremity extension with Min assist from therapist at right triceps and at trunk to maintain balance.  Patient with good participation and tolerance of exercises. Transferred back to w/c  with only supervision. Propelled self back to room in w/c spv/Mod I. Continue  with skilled OT POC to achieve LTGs of Mod I and regain functional strength.  Gross Grasp: R- 20#  L- 19# Lateral Pinch: R- 7#  L- 7# 3 Point Pinch: R-5#  L- 4#     Therapy Documentation Precautions:  Precautions Precautions: Fall Restrictions Weight Bearing Restrictions Per Provider Order: No    Pain:Reports tightness in hamstrings       Therapy/Group: Individual Therapy  [name] 10/13/2023, 12:26 PM

## 2023-10-13 NOTE — Progress Notes (Signed)
 Patient ID: Jonathan Luna, male   DOB: 03/02/1966, 57 y.o.   MRN: [id number]  Met with pt and wife who is present to give them the team conference update regarding goals of mod/I level and target discharge date of 1/7. Discussed the recommendations of OP versus HH. Still waiting for equipment needs.  Pt would like to speak to the neurologist this worker will message and see if responds.

## 2023-10-13 NOTE — Progress Notes (Signed)
NIF greater than -40 and VC 2.2L. Great pt effort

## 2023-10-13 NOTE — Progress Notes (Signed)
 PROGRESS NOTE   Subjective/Complaints:  Pt reports feels like getting weaker and fPt scared has MS- and then also concerned needs more treatment for AIDP- explained he's doing great- Sx's improved today and Exam doing better- no reason to think needs additional treatment- and DOESN'T have MS.   [place] looked up his calves are so tight- and robaxin  not helping much at all.  Also very concerned about LP results.    ROS:    Pt denies SOB, abd pain, CP, N/V/C/D, and vision changes   Negative except for HPI Objective:   DG FL GUIDED LUMBAR PUNCTURE Result Date: 10/12/2023 CLINICAL DATA:  Guillain-Barre syndrome. Request for diagnostic lumbar puncture. EXAM: DIAGNOSTIC LUMBAR PUNCTURE UNDER FLUOROSCOPIC GUIDANCE FLUOROSCOPY TIME:  Radiation Exposure Index (as provided by the fluoroscopic device): 0.80 mGy Number of Acquired Images:  1 PROCEDURE: Informed consent was obtained from the patient prior to the procedure, including potential complications of headache, allergy , and pain. With the patient prone, the lower back was prepped with Betadine. 1% Lidocaine  was used for local anesthesia. Lumbar puncture was performed at the L4-L5 level using a 20 gauge needle with return of clear, colorless CSF with an opening pressure of 25 cm water. Approximately 14 ml of CSF were obtained for laboratory studies. Closing pressure obtained at 15 cm water. The patient tolerated the procedure well and there were no apparent complications. IMPRESSION: Technically successful lumbar puncture from L4-L5 level without complication. Procedure performed by [name] Rusk PA-C and supervised by Dr. Marcey Moan Electronically Signed   By: Marcey Moan M.D.   On: 10/12/2023 14:45   Recent Labs    10/12/23 0552  WBC 7.2  HGB 14.1  HCT 41.3  PLT 378    Recent Labs    10/12/23 0552  NA 137  K 4.5  CL 102  CO2 25  GLUCOSE 86  BUN 10  CREATININE 0.79   CALCIUM  9.4     Intake/Output Summary (Last 24 hours) at 10/13/2023 0905 Last data filed at 10/12/2023 1844 Gross per 24 hour  Intake 580 ml  Output --  Net 580 ml        Physical Exam: Vital Signs Blood pressure 128/79, pulse 78, temperature 98.1 F (36.7 C), resp. rate 16, height 5' 7 (1.702 m), weight 83.5 kg, SpO2 94%.      General: awake, alert, very anxious; sitting up in bed; NAD HENT: conjugate gaze; oropharynx moist CV: regular rate and rhythm; no JVD Pulmonary: CTA B/L; no W/R/R- good air movement GI: soft, NT, ND, (+)BS Psychiatric: extremely anxious- thought now has MS Neurological: Ox3-  Exam done 12/31- Biceps/triceps 5/5; WE on R 4-/5; L 3+/5; Grip 4/5 on R and 4-/5 on L; FA 2+/5 LE's HF/KE 5-/5; DF 4/5 and PF 4+/5 - is better today NMSK- calves tight still- posteriorly- but not tone 12/30 RUE- biceps/triceps 5/5; WE 4-/5; Grip 4/5; FA 2+/5 LUE- biceps/triceps 5/5; WE 3+/5; Grip 4-/5 and FA 2+/5 LE's- HF/KE 5-/5; DF 4-/5; and PF 4/5 MSK- tight calves- B/L Ext: no clubbing, cyanosis, or edema Psych: pleasant and cooperative   Skin: No evidence of breakdown, no evidence of rash  MSK:  No apparent deformity.  ?  Mild left upper extremity drop arm, needs to hike shoulder to initiate.      Bilateral second and third digits with decreased range of motion in finger flexion, extension.  No palpable cyst, contracture, or trigger finger.   Neurologic exam:  Cognition: AAO to person, place, time and event.   CN: ? left facial droop as above, otherwise intact. Dysconjugate gaze Coordination: Bilateral upper extremity intention tremors Spasticity: no abnl tone  Strength:                RUE: 5/5 SA, 5/5 EF, 5/5 EE, 4/5 WE, 3+/5 FA, 4/5 FF                LUE:  5/5 SA, 5/5 EF, 5/5 EE, 4/5 WE, +/5 FA, 4/5 FF                RLE: 5/5 HF, 5/5 KE, 4/5  DF, 235  EHL, 4/5  PF                 LLE:  5/5 HF, 5/5 KE, 4/5  DF, 3/5  EHL, 4/5  PF --motor exam actually  better.    Sensation intact to LT and [employer] B UE and LE except along fingers and toes/soles of feet. No changes with sensation    Assessment/Plan: 1. Functional deficits which require 3+ hours per day of interdisciplinary therapy in a comprehensive inpatient rehab setting. Physiatrist is providing close team supervision and 24 hour management of active medical problems listed below. Physiatrist and rehab team continue to assess barriers to discharge/monitor patient progress toward functional and medical goals  Care Tool:  Bathing    Body parts bathed by patient: Right arm, Left lower leg, Face, Left arm, Chest, Abdomen, Front perineal area, Buttocks, Right upper leg, Left upper leg, Right lower leg         Bathing assist Assist Level: Minimal Assistance - Patient > 75%     Upper Body Dressing/Undressing Upper body dressing   What is the patient wearing?: Pull over shirt    Upper body assist Assist Level: Supervision/Verbal cueing    Lower Body Dressing/Undressing Lower body dressing            Lower body assist Assist for lower body dressing: Moderate Assistance - Patient 50 - 74%     Toileting Toileting    Toileting assist Assist for toileting: Contact Guard/Touching assist     Transfers Chair/bed transfer  Transfers assist  Chair/bed transfer activity did not occur: Refused (patient did not get up)  Chair/bed transfer assist level: Minimal Assistance - Patient > 75%     Locomotion Ambulation   Ambulation assist      Assist level: Minimal Assistance - Patient > 75% Assistive device: No Device Max distance: 180   Walk 10 feet activity   Assist     Assist level: Minimal Assistance - Patient > 75% Assistive device: No Device   Walk 50 feet activity   Assist    Assist level: Minimal Assistance - Patient > 75% Assistive device: No Device    Walk 150 feet activity   Assist    Assist level: Minimal Assistance - Patient >  75% Assistive device: No Device    Walk 10 feet on uneven surface  activity   Assist Walk 10 feet on uneven surfaces activity did not occur: Safety/medical concerns (fatigue)         Wheelchair     Assist  Is the patient using a wheelchair?: Yes Type of Wheelchair: Manual    Wheelchair assist level: Supervision/Verbal cueing Max wheelchair distance: 150    Wheelchair 50 feet with 2 turns activity    Assist        Assist Level: Supervision/Verbal cueing   Wheelchair 150 feet activity     Assist      Assist Level: Supervision/Verbal cueing   Blood pressure 128/79, pulse 78, temperature 98.1 F (36.7 C), resp. rate 16, height 5' 7 (1.702 m), weight 83.5 kg, SpO2 94%.  Medical Problem List and Plan: 1. 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 Onita contacted us  and I put in order for LP to be performed Monday thereabouts. Spoke to pt about this today 12/28.       -12/31- pt says feels stronger today, but concerned might have MS  We discussed at length how he's doing well- breathing tests great- and strength exam actually better  -LP just shows AIDP per Dr Georgianne note and also d/w Neuro on call    2.  Antithrombotics: -DVT/anticoagulation:  Pharmaceutical: Lovenox              -antiplatelet therapy: N/A   3.Chronic LBP/Pain Management: Continue oxycodone  prn-->managed by PCP             --on Gabapentin  100 mg BID for neuropathy -> Per discussion with patient on admission, ongoing hypersensitivity on bottom of feet, discussed gabapentin  adjustment vs. Lyrica, opted to switch to gabapentin  300 mg nightly.   12/24- will change Gabapentin  to 400 mg TID for now- can titrate up more if needed; will also add Robaxin  500 mg q6 hours prn for muscle  spasms 12/28- adjusted gabapentin  to 600mg  tid --it's helping  -adjusted robaxin  to 1000mg  q6 prn  -ordered kpad  -discussed pm and am stretching of calves and hamstrings  12/29--kpad helping   -will increase gabapentin  to 600mg  qid   -reassured pt re: presentation, anxiety is an issue here  12/30- pt having 7-8/10 back pain, but is normal for him- due to OA in his low back- educated pt on how to treat muscle spasms in calves  12/31- changed Robaxin  to Zanaflex  4 mg BID- first dose at 630 am 4. Mood/Behavior/Sleep: Stable on Zoloft  with low dose klonopin  prn. LCSW to follow for evaluation and support.              -antipsychotic agents: N/A             --has been using Klonopin  for sleep   5. Neuropsych/cognition: This patient is capable of making decisions on his own behalf. 6. Skin/Wound Care: Routine pressure relief measures.  7. Fluids/Electrolytes/Nutrition: Monitor I/O. Check CMET in am 8. HTN: Monitor BP TID--continue  metoprolol  and irbesartan   12/29- BP fairly controlled- con't regimen  12/30-12/31 BP controlled- con't regimen 9. Hyponatremia: resolved     Latest Ref Rng & Units 10/12/2023    5:52 AM 10/06/2023    4:56 AM 10/01/2023    5:29 AM  BMP  Glucose 70 - 99 mg/dL 86  93  85  BUN 6 - 20 mg/dL 10  9  11    Creatinine 0.61 - 1.24 mg/dL 9.20  9.31  9.20   Sodium 135 - 145 mmol/L 137  142  133   Potassium 3.5 - 5.1 mmol/L 4.5  3.9  3.6   Chloride 98 - 111 mmol/L 102  108  99   CO2 22 - 32 mmol/L 25  24  27    Calcium  8.9 - 10.3 mg/dL 9.4  9.1  9.0     10. H/o anxiety disorder: Off Buspar  as was ineffective. Continue Klonopin  bid prn 11. H/o Depression: Managed with low dose Zoloft .  12. Hypothyroid: Continue synthroid  125 mc daily for supplement.   13. Cough/wheezing  12/24- no elevated WBC; Afebrile- will treat Sx's- with Mucinex  600 mg BID, Tessalon  pearls scheduled 100 mg TID for 5 days and Albuterol  inhaler, per pt request, not nebs-   12/29--sx have largely  resolved   I spent a total of 56   minutes on total care today- >50% coordination of care- due to  Prolonged d/w pt about anxiety- not going down rabbit hole; stay off internet; also d/w Neuro and review of LP results- also team conference to f/u on progress  LOS: 8 days A FACE TO FACE EVALUATION WAS PERFORMED  Jonathan Luna 10/13/2023, 9:05 AM

## 2023-10-13 NOTE — Telephone Encounter (Signed)
 CSF study reviewed, significantly elevated TP, mild elevation of WBC 12, lymphocyte predominant, clinical history does not support infection, most consistent with AIDP/CIPD,   Please give him a follow up with me with in 1-2 weeks after discharge from hospital.

## 2023-10-14 DIAGNOSIS — G610 Guillain-Barre syndrome: Secondary | ICD-10-CM | POA: Diagnosis not present

## 2023-10-14 LAB — FUNGAL STAIN REFLEX

## 2023-10-14 LAB — FUNGUS STAIN

## 2023-10-14 NOTE — Patient Care Conference (Signed)
 Inpatient RehabilitationTeam Conference and Plan of Care Update Date: 10/13/2023  Time: 11:30 AM   Patient Name: Jonathan Luna      Medical Record Number: [id number]  Date of Birth: May 24, 1966 Sex: Male         Room/Bed: 4W13C/4W13C-01 Payor Info: Payor: [employer] / Plan: [employer] OTHER / Product Type: *No Product type* /    Admit Date/Time:  10/05/2023  6:02 PM  Primary Diagnosis:  Acute inflammatory demyelinating polyneuropathy [facility])  Hospital Problems: Principal Problem:   Acute inflammatory demyelinating polyneuropathy [facility])    Expected Discharge Date: Expected Discharge Date: 10/20/23  Team Members Present: Physician leading conference: Dr. Duwaine Barrs Social Worker Present: Rhoda Clement, LCSW Nurse Present: Barnie Ronde, RN;Talmage Teaster Hilliard, RN PT Present: [name], PT OT Present: Camie Hoe, OT PPS Coordinator present : Eleanor Colon, SLP     Current Status/Progress Goal Weekly Team Focus  Bowel/Bladder   pt continent of b/b   Remain continent   Assist with toileting qshift and prn    Swallow/Nutrition/ Hydration               ADL's   CGA overall for ADLs and functional transfers, weakness in BUE increased in Lt versus Rt   mod I   UE strength/conditioning, UE FMC and coordination, ataxia management    Mobility   supervision bed, CGA transfers with RW,  gait x 150 ft and steps x 4 with 2 HR's   Mod I  muscle spasms    Communication                Safety/Cognition/ Behavioral Observations               Pain   pt c/o back pain   <3 pain score   Assess qshift and prn    Skin   Skin intact   Maintain skin integrity  Assess qshift and prn      Discharge Planning:  Home with wife and asssit from brother. Will need recommendations for discharge.   Team Discussion: AIDP. Skin intact. Patient reports that Robaxin  not working to help with muscle spasms. Strength improved in BLE.  Lower back pain managed with PRN  medications.  Percocet may be home medication. Tolerating regular diet. Wife blind and anxiety are limiting factors.  Brother was helping PTA.  Therapies going well. Gait 150-200 feet. ADLs are CGA and working on left wrist weakness. Patient on target to meet rehab goals: yes, patient continues to progress towards goals with discharge date of 10/20/23  *See Care Plan and progress notes for long and short-term goals.   Revisions to Treatment Plan:  Updated LP results.  Therapy to try tennis balls to release muscle spasms. Added Zanaflex . Family education needed.  Will try to get Neuro-psych to see prior to discharge.   Teaching Needs: Medications, safety, self care, gait/transfer training, etc.   Current Barriers to Discharge: Decreased caregiver support  Possible Resolutions to Barriers: Family education Order recommended DME     Medical Summary Current Status: AIDP- continent with bowel and bladder- on percocet supposedly at home for chronic LBP-  Barriers to Discharge: Other (comments);Behavior/Mood;Medical stability;Neurogenic Bowel & Bladder;Uncontrolled Pain;Weight bearing restrictions;Self-care education  Barriers to Discharge Comments: wife is blind- limited by pt's severe  anxiety; muscle spasms/pain/ Possible Resolutions to [employer] Focus: is CGA overall right now- changing robaxint o Zanaflex - can do estim for wrists/hands/FA;  to do outpt therapy- d/c 10/20/23   Continued Need for Acute Rehabilitation Level  of Care: The patient requires daily medical management by a physician with specialized training in physical medicine and rehabilitation for the following reasons: Direction of a multidisciplinary physical rehabilitation program to maximize functional independence : Yes Medical management of patient stability for increased activity during participation in an intensive rehabilitation regime.: Yes Analysis of laboratory values and/or radiology reports with any subsequent  need for medication adjustment and/or medical intervention. : Yes   I attest that I was present, lead the team conference, and concur with the assessment and plan of the team.   Darice LITTIE Boring 10/14/2023, 6:26 AM

## 2023-10-14 NOTE — Progress Notes (Signed)
 RT note. Nif -60 VC

## 2023-10-14 NOTE — Progress Notes (Addendum)
 Occupational Therapy Session Note  Patient Details  Name: Jonathan Luna MRN: [id number] Date of Birth: 01/22/1966  Today's Date: 10/14/2023 OT Individual Time: 9184-9154 OT Individual Time Calculation (min): 30 min    Short Term Goals: Week 2:  OT Short Term Goal 1 (Week 2): STG=LTG d/t ELOS  Skilled Therapeutic Interventions/Progress Updates:    Pt resting in bed upon arrival. Pt voiced concern that he didn't think therapy was doing any good.  Therapeutic listening employed to reassure pt. Pt stated he was used to going to gym and seeing immediate results from exercises. Educated pt on severity of GBS and probable outcomes. Pt requested to use foam blocks in room to improve grasp strength. Pt reported he felt assured of importance on therapy. Pt remained in bed with all needs within reach. Bed alarm activated. Reviewed LTGs and continued with discharge planning.  Therapy Documentation Precautions:  Precautions Precautions: Fall Restrictions Weight Bearing Restrictions Per Provider Order: No   Pain: Pt denies pain this morning   Therapy/Group: Individual Therapy  Maritza Debby Mare 10/14/2023, 10:02 AM

## 2023-10-14 NOTE — Progress Notes (Signed)
 Physical Therapy Session Note  Patient Details  Name: Jonathan Luna MRN: [id number] Date of Birth: 07/06/1966  Today's Date: 10/14/2023 PT Individual Time: [phone], 1419-1530  PT Individual Time Calculation (min): 42 min , 71 min   Short Term Goals: Week 2:  PT Short Term Goal 1 (Week 2): STG's=LTG's due to ELOS  Skilled Therapeutic Interventions/Progress Updates:      Therapy Documentation Precautions:  Precautions Precautions: Fall Restrictions Weight Bearing Restrictions Per Provider Order: No  Treatment Session 1:   PT session with emphasis on global strength, endurance and gait training to improve confidence with gait as pt fearful of knee buckling. Pt engaged in ambulation ~800 ft + 500 ft without AD with one episode of mild right knee instability in which pt able to self-recover without assist. Pt provided cues for foot slap and for energy conservation/pacing. Pt returned to room and left semi-reclined in bed with all needs in reach.    Treatment Session 2:   Pt agreeable to PT session with emphasis on UE global strength training. Pt declines pain and engaged in conversation with PT and RT regarding coping styles, goals/expectations and confidence. PT utilized therapeutic use of self to provide support and encouragement. Pt performed following UE strength exercises bilaterally:   -bicep curls, 3 x 12 6#  -frontal raises, 2 x 12 (right 6#) + (left (4#)   -shoulder abduction, 2 x 12 (4#)   -triceps pressess 2, x 12 (6#)   -bodyweight wrist extension 2 x 12   -supination and pronation with (2#) dumb bell   Pt returned to room and left seated at bedside with all needs in reach.    Therapy/Group: Individual Therapy  [name] [name] PT, DPT  10/14/2023, 9:33 AM

## 2023-10-14 NOTE — Progress Notes (Signed)
 Occupational Therapy Session Note  Patient Details  Name: Jonathan Luna MRN: [id number] Date of Birth: 03-03-1966  Today's Date: 10/14/2023 OT Individual Time: 1100-1200 OT Individual Time Calculation (min): 60 min    Short Term Goals: Week 2:  OT Short Term Goal 1 (Week 2): STG=LTG d/t ELOS  Skilled Therapeutic Interventions/Progress Updates:      Therapy Documentation Precautions:  Precautions Precautions: Fall Restrictions Weight Bearing Restrictions Per Provider Order: No General: "[employer]!" Pt seated in W/C upon OT arrival, agreeable to OT.  Pain: decreased pain reported in calves, activity, intermittent rest breaks, distractions provided for pain management, pt reports tolerable to proceed.   Exercises: Pt participating in functional grasp/release exercises with e-stim unit switching between multiple trials on bilateral wrists. E-stim applied to wrist extensors and providing full ROM with good feedback. Pt completing trials of reaching for checkers on vertical surface in order to promote increased wrist extension.   Other Treatments: OT kept e-stim on Lt wrist after session for entirety of cycle for maximum benefit.   Pt seated in W/C at end of session with W/C alarm donned, call light within reach and 4Ps assessed.    Therapy/Group: Individual Therapy  Credit Hoe, OTD, OTR/L 10/14/2023, 4:13 PM

## 2023-10-14 NOTE — Progress Notes (Signed)
 PROGRESS NOTE   Subjective/Complaints:  Pt reports having new numbness on  bottom Lip- and possibly on L face, but feels more dry L shoulder twinging.  Also feels like having moe mucus/coughing at night- explained it's due to laying down- the more he can sleep sitting up, the better/drainage wise.   Pt also reports he wheezes at night.   ROS:    Pt denies SOB but does report coughing, abd pain, CP, N/V/C/D, and vision changes    Negative except for HPI Objective:   DG FL GUIDED LUMBAR PUNCTURE Result Date: 10/12/2023 CLINICAL DATA:  Guillain-Barre syndrome. Request for diagnostic lumbar puncture. EXAM: DIAGNOSTIC LUMBAR PUNCTURE UNDER FLUOROSCOPIC GUIDANCE FLUOROSCOPY TIME:  Radiation Exposure Index (as provided by the fluoroscopic device): 0.80 mGy Number of Acquired Images:  1 PROCEDURE: Informed consent was obtained from the patient prior to the procedure, including potential complications of headache, allergy , and pain. With the patient prone, the lower back was prepped with Betadine. 1% Lidocaine  was used for local anesthesia. Lumbar puncture was performed at the L4-L5 level using a 20 gauge needle with return of clear, colorless CSF with an opening pressure of 25 cm water. Approximately 14 ml of CSF were obtained for laboratory studies. Closing pressure obtained at 15 cm water. The patient tolerated the procedure well and there were no apparent complications. IMPRESSION: Technically successful lumbar puncture from L4-L5 level without complication. Procedure performed by [name] Rusk PA-C and supervised by Dr. Marcey Moan Electronically Signed   By: Marcey Moan M.D.   On: 10/12/2023 14:45   Recent Labs    10/12/23 0552  WBC 7.2  HGB 14.1  HCT 41.3  PLT 378    Recent Labs    10/12/23 0552  NA 137  K 4.5  CL 102  CO2 25  GLUCOSE 86  BUN 10  CREATININE 0.79  CALCIUM  9.4     Intake/Output Summary (Last  24 hours) at 10/14/2023 0912 Last data filed at 10/14/2023 9192 Gross per 24 hour  Intake 1192 ml  Output --  Net 1192 ml        Physical Exam: Vital Signs Blood pressure 133/87, pulse 78, temperature 97.7 F (36.5 C), temperature source Oral, resp. rate 18, height 5' 7 (1.702 m), weight 83.5 kg, SpO2 92%.       General: awake, alert, appropriate, SITTING UP IN BED;  NAD HENT: conjugate gaze; oropharynx moist CV: regular rate and rhythm; no JVD Pulmonary: CTA B/L; no W/R/R- good air movement- but does have cough- sounds broncho-spasmodic GI: soft, NT, ND, (+)BS Psychiatric: appropriate- still a little anxious about recovery Neurological: Ox3 No change in strength in Ue's except wrists appears weaker- ~ 3-/5 B/L- BUT OTHERWISE THE SAME Exam done 12/31- Biceps/triceps 5/5; WE on R 4-/5; L 3+/5; Grip 4/5 on R and 4-/5 on L; FA 2+/5 LE's HF/KE 5-/5; DF 4/5 and PF 4+/5 - is better today NMSK- calves tight still- posteriorly- but not tone 12/30 RUE- biceps/triceps 5/5; WE 4-/5; Grip 4/5; FA 2+/5 LUE- biceps/triceps 5/5; WE 3+/5; Grip 4-/5 and FA 2+/5 LE's- HF/KE 5-/5; DF 4-/5; and PF 4/5 MSK- tight calves- B/L Ext: no clubbing, cyanosis, or edema  Psych: pleasant and cooperative   Skin: No evidence of breakdown, no evidence of rash  MSK:      No apparent deformity.  ?  Mild left upper extremity drop arm, needs to hike shoulder to initiate.      Bilateral second and third digits with decreased range of motion in finger flexion, extension.  No palpable cyst, contracture, or trigger finger.   Neurologic exam:  Cognition: AAO to person, place, time and event.   CN: ? left facial droop as above, otherwise intact. Dysconjugate gaze Coordination: Bilateral upper extremity intention tremors Spasticity: no abnl tone  Strength:                RUE: 5/5 SA, 5/5 EF, 5/5 EE, 4/5 WE, 3+/5 FA, 4/5 FF                LUE:  5/5 SA, 5/5 EF, 5/5 EE, 4/5 WE, +/5 FA, 4/5 FF                RLE:  5/5 HF, 5/5 KE, 4/5  DF, 235  EHL, 4/5  PF                 LLE:  5/5 HF, 5/5 KE, 4/5  DF, 3/5  EHL, 4/5  PF --motor exam actually better.    Sensation intact to LT and Proprio B UE and LE except along fingers and toes/soles of feet. No changes with sensation    Assessment/Plan: 1. Functional deficits which require 3+ hours per day of interdisciplinary therapy in a comprehensive inpatient rehab setting. Physiatrist is providing close team supervision and 24 hour management of active medical problems listed below. Physiatrist and rehab team continue to assess barriers to discharge/monitor patient progress toward functional and medical goals  Care Tool:  Bathing    Body parts bathed by patient: Right arm, Left lower leg, Face, Left arm, Chest, Abdomen, Front perineal area, Buttocks, Right upper leg, Left upper leg, Right lower leg         Bathing assist Assist Level: Minimal Assistance - Patient > 75%     Upper Body Dressing/Undressing Upper body dressing   What is the patient wearing?: Pull over shirt    Upper body assist Assist Level: Supervision/Verbal cueing    Lower Body Dressing/Undressing Lower body dressing            Lower body assist Assist for lower body dressing: Moderate Assistance - Patient 50 - 74%     Toileting Toileting    Toileting assist Assist for toileting: Contact Guard/Touching assist     Transfers Chair/bed transfer  Transfers assist  Chair/bed transfer activity did not occur: Refused (patient did not get up)  Chair/bed transfer assist level: Minimal Assistance - Patient > 75%     Locomotion Ambulation   Ambulation assist      Assist level: Minimal Assistance - Patient > 75% Assistive device: No Device Max distance: 180   Walk 10 feet activity   Assist     Assist level: Minimal Assistance - Patient > 75% Assistive device: No Device   Walk 50 feet activity   Assist    Assist level: Minimal Assistance - Patient >  75% Assistive device: No Device    Walk 150 feet activity   Assist    Assist level: Minimal Assistance - Patient > 75% Assistive device: No Device    Walk 10 feet on uneven surface  activity   Assist Walk 10 feet on uneven  surfaces activity did not occur: Safety/medical concerns (fatigue)         Wheelchair     Assist Is the patient using a wheelchair?: Yes Type of Wheelchair: Manual    Wheelchair assist level: Supervision/Verbal cueing Max wheelchair distance: 150    Wheelchair 50 feet with 2 turns activity    Assist        Assist Level: Supervision/Verbal cueing   Wheelchair 150 feet activity     Assist      Assist Level: Supervision/Verbal cueing   Blood pressure 133/87, pulse 78, temperature 97.7 F (36.5 C), temperature source Oral, resp. rate 18, height 5' 7 (1.702 m), weight 83.5 kg, SpO2 92%.  Medical Problem List and Plan: 1. 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 Onita contacted us  and I put in order for LP to be performed Monday thereabouts. Spoke to pt about this today 12/28.       -12/31- pt says feels stronger today, but concerned might have MS  We discussed at length how he's doing well- breathing tests great- and strength exam actually better  -LP just shows AIDP per Dr Georgianne note and also d/w Neuro on call   1/1- pt doing great on NIFs and VC- explained that unless those go down or a dramatic change in strength- won't do additional treatment- pt appears to want more treatment to speed up recovery, which I understand, however it doesn't usually do so in the literature.   Con't CIR PT and OT 2.  Antithrombotics: -DVT/anticoagulation:  Pharmaceutical: Lovenox              -antiplatelet therapy: N/A   3.Chronic  LBP/Pain Management: Continue oxycodone  prn-->managed by PCP             --on Gabapentin  100 mg BID for neuropathy -> Per discussion with patient on admission, ongoing hypersensitivity on bottom of feet, discussed gabapentin  adjustment vs. Lyrica, opted to switch to gabapentin  300 mg nightly.   12/24- will change Gabapentin  to 400 mg TID for now- can titrate up more if needed; will also add Robaxin  500 mg q6 hours prn for muscle spasms 12/28- adjusted gabapentin  to 600mg  tid --it's helping  -adjusted robaxin  to 1000mg  q6 prn  -ordered kpad  -discussed pm and am stretching of calves and hamstrings  12/29--kpad helping   -will increase gabapentin  to 600mg  qid   -reassured pt re: presentation, anxiety is an issue here  12/30- pt having 7-8/10 back pain, but is normal for him- due to OA in his low back- educated pt on how to treat muscle spasms in calves  12/31- changed Robaxin  to Zanaflex  4 mg BID- first dose at 630 am  1/1- Pt reports muscle spasms slightly better but still twinging this AM 4. Mood/Behavior/Sleep: Stable on Zoloft  with low dose klonopin  prn. LCSW to follow for evaluation and support.              -antipsychotic agents: N/A             --has been using Klonopin  for sleep   5. Neuropsych/cognition: This patient is  capable of making decisions on his own behalf. 6. Skin/Wound Care: Routine pressure relief measures.  7. Fluids/Electrolytes/Nutrition: Monitor I/O. Check CMET in am 8. HTN: Monitor BP TID--continue  metoprolol  and irbesartan   1/1- BP controlled- con't regimen 9. Hyponatremia: resolved     Latest Ref Rng & Units 10/12/2023    5:52 AM 10/06/2023    4:56 AM 10/01/2023    5:29 AM  BMP  Glucose 70 - 99 mg/dL 86  93  85   BUN 6 - 20 mg/dL 10  9  11    Creatinine 0.61 - 1.24 mg/dL 9.20  9.31  9.20   Sodium 135 - 145 mmol/L 137  142  133   Potassium 3.5 - 5.1 mmol/L 4.5  3.9  3.6   Chloride 98 - 111 mmol/L 102  108  99   CO2 22 - 32 mmol/L 25  24  27    Calcium  8.9  - 10.3 mg/dL 9.4  9.1  9.0     10. H/o anxiety disorder: Off Buspar  as was ineffective. Continue Klonopin  bid prn 11. H/o Depression: Managed with low dose Zoloft .  12. Hypothyroid: Continue synthroid  125 mc daily for supplement.   13. Cough/wheezing  12/24- no elevated WBC; Afebrile- will treat Sx's- with Mucinex  600 mg BID, Tessalon  pearls scheduled 100 mg TID for 5 days and Albuterol  inhaler, per pt request, not nebs-   12/29--sx have largely resolved  1/1- pt reports still has cough- asking for inhaler- which I agree with- had pneumonia 2 months ago- and said cough hasn't resolved so far.    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     LOS: 9 days A FACE TO FACE EVALUATION WAS PERFORMED  Adelee Hannula 10/14/2023, 9:12 AM

## 2023-10-15 DIAGNOSIS — G610 Guillain-Barre syndrome: Secondary | ICD-10-CM | POA: Diagnosis not present

## 2023-10-15 LAB — CSF CULTURE W GRAM STAIN

## 2023-10-15 LAB — BASIC METABOLIC PANEL
Anion gap: 12 (ref 5–15)
BUN: 9 mg/dL (ref 6–20)
CO2: 24 mmol/L (ref 22–32)
Calcium: 9.5 mg/dL (ref 8.9–10.3)
Chloride: 102 mmol/L (ref 98–111)
Creatinine, Ser: 0.77 mg/dL (ref 0.61–1.24)
GFR, Estimated: >60 mL/min (ref >60)
Glucose, Bld: 97 mg/dL (ref 70–99)
Potassium: 4.2 mmol/L (ref 3.5–5.1)
Sodium: 138 mmol/L (ref 135–145)

## 2023-10-15 LAB — CBC
HCT: 42.2 % (ref 39.0–52.0)
Hemoglobin: 14.4 g/dL (ref 13.0–17.0)
MCH: 30.8 pg (ref 26.0–34.0)
MCHC: 34.1 g/dL (ref 30.0–36.0)
MCV: 90.4 fL (ref 80.0–100.0)
Platelets: 384 10*3/uL (ref 150–400)
RBC: 4.67 MIL/uL (ref 4.22–5.81)
RDW: 13.3 % (ref 11.5–15.5)
WBC: 5.9 10*3/uL (ref 4.0–10.5)
nRBC: 0 % (ref 0.0–0.2)

## 2023-10-15 MED ORDER — BENZONATATE 100 MG PO CAPS
100.0000 mg | ORAL_CAPSULE | Freq: Three times a day (TID) | ORAL | Status: AC
  Administered 2023-10-15 – 2023-10-19 (×15): 100 mg via ORAL
  Filled 2023-10-15 (×15): qty 1

## 2023-10-15 MED ORDER — GABAPENTIN 300 MG PO CAPS
600.0000 mg | ORAL_CAPSULE | Freq: Every day | ORAL | Status: DC | PRN
  Administered 2023-10-16 – 2023-10-20 (×5): 600 mg via ORAL
  Filled 2023-10-15 (×5): qty 2

## 2023-10-15 MED ORDER — DULOXETINE HCL 60 MG PO CPEP
60.0000 mg | ORAL_CAPSULE | Freq: Every day | ORAL | Status: DC
  Administered 2023-10-18 – 2023-10-19 (×2): 60 mg via ORAL
  Filled 2023-10-15 (×2): qty 1

## 2023-10-15 MED ORDER — DULOXETINE HCL 30 MG PO CPEP
30.0000 mg | ORAL_CAPSULE | Freq: Every day | ORAL | Status: AC
  Administered 2023-10-15 – 2023-10-17 (×3): 30 mg via ORAL
  Filled 2023-10-15 (×3): qty 1

## 2023-10-15 NOTE — Progress Notes (Signed)
 PROGRESS NOTE   Subjective/Complaints:  Pt reports he needs shower- was offered one early- but didn't want to do til after  ate- trying to see if can get with therapy.   Pt also reports tips of fingers and feet feel like ice- concerned there's an issue- explained it's nerve pain- and can add Gabapentin  prn for this in addition to gabapentin  600 mg QID getting.  Also added Duloxetine  for nerve pain- educated could cause nausea for 7-10 days.   Also bought a tub transfer bench, but thinks needs another type- asked him to speak to therapy- explained sometimes covered- sometimes not, depends on insurance.     ROS:    Pt denies SOB, abd pain, CP, N/V/C/D, and vision changes   Negative except for HPI Objective:   No results found.  Recent Labs    10/15/23 0610  WBC 5.9  HGB 14.4  HCT 42.2  PLT 384    Recent Labs    10/15/23 0610  NA 138  K 4.2  CL 102  CO2 24  GLUCOSE 97  BUN 9  CREATININE 0.77  CALCIUM  9.5     Intake/Output Summary (Last 24 hours) at 10/15/2023 0855 Last data filed at 10/15/2023 0725 Gross per 24 hour  Intake 600 ml  Output --  Net 600 ml        Physical Exam: Vital Signs Blood pressure 139/69, pulse 75, temperature 97.8 F (36.6 C), temperature source Oral, resp. rate 18, height 5' 7 (1.702 m), weight 83.5 kg, SpO2 97%.       General: awake, alert, appropriate, sitting up in w/c at bedside; feet propped on bed;  NAD HENT: conjugate gaze; oropharynx moist CV: regular rate and rhythm; no JVD Pulmonary: CTA B/L; no W/R/R- good air movement GI: soft, NT, ND, (+)BS- normoactive Psychiatric: still anxious about thyroid , nerve pain Neurological: Ox3  No change in strength in Ue's except wrists appears weaker- ~ 3-/5 B/L- BUT OTHERWISE THE SAME Exam done 12/31- Biceps/triceps 5/5; WE on R 4-/5; L 3+/5; Grip 4/5 on R and 4-/5 on L; FA 2+/5 LE's HF/KE 5-/5; DF 4/5 and PF 4+/5 - is  better today NMSK- calves tight still- posteriorly- but not tone 12/30 RUE- biceps/triceps 5/5; WE 4-/5; Grip 4/5; FA 2+/5 LUE- biceps/triceps 5/5; WE 3+/5; Grip 4-/5 and FA 2+/5 LE's- HF/KE 5-/5; DF 4-/5; and PF 4/5 MSK- tight calves- B/L Ext: no clubbing, cyanosis, or edema Psych: pleasant and cooperative   Skin: No evidence of breakdown, no evidence of rash  MSK:      No apparent deformity.  ?  Mild left upper extremity drop arm, needs to hike shoulder to initiate.      Bilateral second and third digits with decreased range of motion in finger flexion, extension.  No palpable cyst, contracture, or trigger finger.   Neurologic exam:  Cognition: AAO to person, place, time and event.   CN: ? left facial droop as above, otherwise intact. Dysconjugate gaze Coordination: Bilateral upper extremity intention tremors Spasticity: no abnl tone  Strength:                RUE: 5/5 SA, 5/5 EF, 5/5 EE,  4/5 WE, 3+/5 FA, 4/5 FF                LUE:  5/5 SA, 5/5 EF, 5/5 EE, 4/5 WE, +/5 FA, 4/5 FF                RLE: 5/5 HF, 5/5 KE, 4/5  DF, 235  EHL, 4/5  PF                 LLE:  5/5 HF, 5/5 KE, 4/5  DF, 3/5  EHL, 4/5  PF --motor exam actually better.    Sensation intact to LT and Proprio B UE and LE except along fingers and toes/soles of feet. No changes with sensation    Assessment/Plan: 1. Functional deficits which require 3+ hours per day of interdisciplinary therapy in a comprehensive inpatient rehab setting. Physiatrist is providing close team supervision and 24 hour management of active medical problems listed below. Physiatrist and rehab team continue to assess barriers to discharge/monitor patient progress toward functional and medical goals  Care Tool:  Bathing    Body parts bathed by patient: Right arm, Left lower leg, Face, Left arm, Chest, Abdomen, Front perineal area, Buttocks, Right upper leg, Left upper leg, Right lower leg         Bathing assist Assist Level: Minimal  Assistance - Patient > 75%     Upper Body Dressing/Undressing Upper body dressing   What is the patient wearing?: Pull over shirt    Upper body assist Assist Level: Supervision/Verbal cueing    Lower Body Dressing/Undressing Lower body dressing            Lower body assist Assist for lower body dressing: Moderate Assistance - Patient 50 - 74%     Toileting Toileting    Toileting assist Assist for toileting: Contact Guard/Touching assist     Transfers Chair/bed transfer  Transfers assist  Chair/bed transfer activity did not occur: Refused (patient did not get up)  Chair/bed transfer assist level: Minimal Assistance - Patient > 75%     Locomotion Ambulation   Ambulation assist      Assist level: Minimal Assistance - Patient > 75% Assistive device: No Device Max distance: 180   Walk 10 feet activity   Assist     Assist level: Minimal Assistance - Patient > 75% Assistive device: No Device   Walk 50 feet activity   Assist    Assist level: Minimal Assistance - Patient > 75% Assistive device: No Device    Walk 150 feet activity   Assist    Assist level: Minimal Assistance - Patient > 75% Assistive device: No Device    Walk 10 feet on uneven surface  activity   Assist Walk 10 feet on uneven surfaces activity did not occur: Safety/medical concerns (fatigue)         Wheelchair     Assist Is the patient using a wheelchair?: Yes Type of Wheelchair: Manual    Wheelchair assist level: Supervision/Verbal cueing Max wheelchair distance: 150    Wheelchair 50 feet with 2 turns activity    Assist        Assist Level: Supervision/Verbal cueing   Wheelchair 150 feet activity     Assist      Assist Level: Supervision/Verbal cueing   Blood pressure 139/69, pulse 75, temperature 97.8 F (36.6 C), temperature source Oral, resp. rate 18, height 5' 7 (1.702 m), weight 83.5 kg, SpO2 97%.  Medical Problem List and Plan: 1.  Functional deficits  secondary to acute inflammatory demyelinating polyneuropathy             -patient may shower             -ELOS/Goals: 10 to 14 days, supervision to modified independent PT/OT             -Continue NIFs and VC  daily             - Dr. Onita contacted us  and I put in order for LP to be performed Monday thereabouts. Spoke to pt about this today 12/28.       -12/31- pt says feels stronger today, but concerned might have MS  We discussed at length how he's doing well- breathing tests great- and strength exam actually better  -LP just shows AIDP per Dr Georgianne note and also d/w Neuro on call   1/1- pt doing great on NIFs and VC- explained that unless those go down or a dramatic change in strength- won't do additional treatment- pt appears to want more treatment to speed up recovery, which I understand, however it doesn't usually do so in the literature.   Con't CIR PT and OT 2.  Antithrombotics: -DVT/anticoagulation:  Pharmaceutical: Lovenox              -antiplatelet therapy: N/A   3.Chronic LBP/Pain Management: Continue oxycodone  prn-->managed by PCP             --on Gabapentin  100 mg BID for neuropathy -> Per discussion with patient on admission, ongoing hypersensitivity on bottom of feet, discussed gabapentin  adjustment vs. Lyrica, opted to switch to gabapentin  300 mg nightly.   12/24- will change Gabapentin  to 400 mg TID for now- can titrate up more if needed; will also add Robaxin  500 mg q6 hours prn for muscle spasms 12/28- adjusted gabapentin  to 600mg  tid --it's helping  -adjusted robaxin  to 1000mg  q6 prn  -ordered kpad  -discussed pm and am stretching of calves and hamstrings  12/29--kpad helping   -will increase gabapentin  to 600mg  qid   -reassured pt re: presentation, anxiety is an issue here  12/30- pt having 7-8/10 back pain, but is normal for him- due to OA in his low back- educated pt on how to treat muscle spasms in calves  12/31- changed Robaxin  to Zanaflex  4 mg  BID- first dose at 630 am  1/1- Pt reports muscle spasms slightly better but still twinging this AM  1/2- added Duloxetine  30 mg at bedtime and will increase in 3 days if tolerated  -also added gabapentin  600 mg qday prn for times when nerve pain worse (feeling cold hands and feet, although not cold to touch) 4. Mood/Behavior/Sleep: Stable on Zoloft  with low dose klonopin  prn. LCSW to follow for evaluation and support.              -antipsychotic agents: N/A             --has been using Klonopin  for sleep   5. Neuropsych/cognition: This patient is capable of making decisions on his own behalf. 6. Skin/Wound Care: Routine pressure relief measures.  7. Fluids/Electrolytes/Nutrition: Monitor I/O. Check CMET in am 8. HTN: Monitor BP TID--continue  metoprolol  and irbesartan   1/1-1/2 BP controlled- con't regimen 9. Hyponatremia: resolved     Latest Ref Rng & Units 10/15/2023    6:10 AM 10/12/2023    5:52 AM 10/06/2023    4:56 AM  BMP  Glucose 70 - 99 mg/dL 97  86  93   BUN 6 -  20 mg/dL 9  10  9    Creatinine 0.61 - 1.24 mg/dL 9.22  9.20  9.31   Sodium 135 - 145 mmol/L 138  137  142   Potassium 3.5 - 5.1 mmol/L 4.2  4.5  3.9   Chloride 98 - 111 mmol/L 102  102  108   CO2 22 - 32 mmol/L 24  25  24    Calcium  8.9 - 10.3 mg/dL 9.5  9.4  9.1     10. H/o anxiety disorder: Off Buspar  as was ineffective. Continue Klonopin  bid prn 11. H/o Depression: Managed with low dose Zoloft .  12. Hypothyroid: Continue synthroid  125 mc daily for supplement. 1/2- pt askin gif can rescheck labs- explained that shouldn't check closer than 3 months- also explained that in acute setting- like being ill/having GBS, can make TSH labs haywire- needs to be checked out of hospital.    13. Cough/wheezing  12/24- no elevated WBC; Afebrile- will treat Sx's- with Mucinex  600 mg BID, Tessalon  pearls scheduled 100 mg TID for 5 days and Albuterol  inhaler, per pt request, not nebs-   12/29--sx have largely resolved  1/1- pt  reports still has cough- asking for inhaler- which I agree with- had pneumonia 2 months ago- and said cough hasn't resolved so far.   1/2- doing a little better with albuterol  inhaler- doesn't like nebs- tried in past- will restart Tessalon  pearls 100 mg TID for 5 days.   I spent a total of 46  minutes on total care today- >50% coordination of care- due to  D/w pt about his nerve pain and shower- also thyroid  questions- and d/w nursing about med changes.   LOS: 10 days A FACE TO FACE EVALUATION WAS PERFORMED  Mauriana Dann 10/15/2023, 8:55 AM

## 2023-10-15 NOTE — Progress Notes (Signed)
 NIF: -40 cmH2O VC: 3L Pt performed w/ great effort

## 2023-10-15 NOTE — Progress Notes (Signed)
 Physical Therapy Session Note  Patient Details  Name: Jonathan Luna MRN: [id number] Date of Birth: 05-27-1966  Today's Date: 10/15/2023 PT Individual Time: 0915-1000 + 1430-1530 PT Individual Time Calculation (min): 45 min  + 60 min  Short Term Goals: Week 2:  PT Short Term Goal 1 (Week 2): STG's=LTG's due to ELOS  Skilled Therapeutic Interventions/Progress Updates:      1st session: Pt sitting in wheelchair to start. Propels himself mod I at wheelchair level to day room rehab gym, ~123ft.   Sit<>Stand with no AD with CGA from w/c height. Gait training ~119ft to main rehab gym with CGA and no AD - primary gait deficits are foot slap bilaterally, absent heel strike, excessive hip/knee flexion in swing, and mild hip instability.   Pt c/o calf tightness bilaterally. Instructed in stretching using the 3 stairs and 2 hand rails for UE support. 3x30 second stretching for each foot. Then instructed in bilateral calf raises on the 3 stair and 2 hand rails to help with AAROM - 2x10 with seated rest break b/w sets.   Discussed ankle DF weakness 3-/5 and options for bracing to help with foot drop and safety while ambulating. Introduced basic PLS AFO's (ottobok walkon) and patient expressed interest. Donned with totalA and educated on wear schedule and purpose. (Anticipate he would wear mostly while doing community mobility), Pt ambulated various hallway distances with B AFO's on - noted improving natural gait fluidity, confidence, and B heel strike. Pt requesting a pair at DC but will discuss with his primary team.   Pt ended treatment sitting in wheelchair with all his needs met.    2nd session: Pt in bed to start - wearing B AFO's from AM session. Reports no pain. Discussed scheduling AFO consult for tomorrow, Friday, at 1pm with [facility] from [facility] - pt in agreement and excited about opportunity.   Bed mobility completed with distant supervision. Sit<>Stand with CGA and no AD from EOB.  Ambulates with CGA and no AD from his room to main rehab gym. Improved stride length and gait speed compared to no AFO's.   Discussed anticipated DME rec's - reviewed options of RW vs rollator. Pt interested in rollator so this was provided for trialing. Educated patient on general safety precautions and rollator management. Completed gait training in hallways using the rollator with B AFO's off (pt requesting to try it off to work on heel strike and eccentric DF control). Pt ambulates with supervision using rollator hallways distances, improved trunk stability with BUE support while ambulating. Also improved stability with turns compared to no AD. CSW notified of DME rec for rollator.   Remainder of session spent on addressing B foot drop using NMES. Used [employer] NeuroMove to provide biofeedback for ankle DF. Completed x15 minutes for each lower extremity and adjusted thresholds as appropriate. Stimulus set to 50mA each and preset settings for CVA recovery on NeuroMove utilized. Skin c/d/I after treatment and patient having good response with AROM during task.   Pt returned to his room and left in bed with all needs met. Rollator, B AFO's left in room for further training.    Therapy Documentation Precautions:  Precautions Precautions: Fall Restrictions Weight Bearing Restrictions Per Provider Order: No General:     Therapy/Group: Individual Therapy  Sherlean SHAUNNA Perks 10/15/2023, 7:51 AM

## 2023-10-15 NOTE — Progress Notes (Signed)
 Occupational Therapy Session Note  Patient Details  Name: Jonathan Luna MRN: [id number] Date of Birth: 1966-05-20  Today's Date: 10/15/2023 OT Individual Time: 0800-0850 OT Individual Time Calculation (min): 50 min    Short Term Goals: Week 2:  OT Short Term Goal 1 (Week 2): STG=LTG d/t ELOS  Skilled Therapeutic Interventions/Progress Updates:      Therapy Documentation Precautions:  Precautions Precautions: Fall Restrictions Weight Bearing Restrictions Per Provider Order: No General: "[employer]!" Pt seated in W/C upon OT arrival, agreeable to OT.  Pain:  decreased pain reported in calves, activity, intermittent rest breaks, distractions provided for pain management, pt reports tolerable to proceed.   ADL: Pt completed full ADL this AM at SBA level overall for bathing, dressing, toileting and grooming. Pt and OT discussing various types of TTB and which would be best for pt. Pt educated on tub/shower transfers and will attempt before pt D/C for increased comfort with task. Pt educated on safety with TTB and tub shower in order to prevent water from leaking out of shower for fall prevention.  Other Treatments: Pt given information for Saebo One d/t pt interested in purchasing one at D/C. Pt and OT discussed W/C for home use d/t pt going on vacation at end of Feb. OT sent message to SW for follow up.   Pt seated in W/C at end of session with W/C alarm donned, call light within reach and 4Ps assessed. Direct handoff to respiratory therapy.   Therapy/Group: Individual Therapy  Credit Hoe, OTD, OTR/L 10/15/2023, 9:46 AM

## 2023-10-15 NOTE — Progress Notes (Signed)
 Patient requested a shower this morning. Nurse advised patient that he would need to shower before 5am so that nurse could stay with him while he showered for safety reasons. Patient declined shower and stated he needed to first drink his coffee before he could shower and that he would shower with therapy if able.

## 2023-10-15 NOTE — Progress Notes (Signed)
 Orthopedic Tech Progress Note Patient Details:  Jonathan Luna 10-Mar-1966 [id number]  Order for bilateral AFO consults called into [facility].  Patient ID: Jonathan Luna, male   DOB: [birth year], 58 y.o.   MRN: [id number]  [name] 10/15/2023, 12:19 PM

## 2023-10-15 NOTE — Evaluation (Signed)
 Recreational Therapy Assessment and Plan  Patient Details  Name: Jonathan Luna MRN: [id number] Date of Birth: 1966-02-25 Today's Date: 10/15/2023  Rehab Potential:  Good ELOS:   d/c 1/7  Assessment Hospital Problem: Principal Problem:   Acute inflammatory demyelinating polyneuropathy [facility])     Past Medical History:      Past Medical History:  Diagnosis Date   Allergy      Anxiety     Arthritis     Cataract     Community acquired pneumonia 08/28/2023   Depression     Detached retina     Heart murmur     History of bilateral cataract extraction     Hyperlipidemia     Hypothyroidism     Low back pain     Panic attack     Panic attacks          Past Surgical History:       Past Surgical History:  Procedure Laterality Date   CATARACT EXTRACTION       EYE SURGERY              Assessment & Plan Clinical Impression: Patient is a 58 y.o. year old male with history of HTN, chronic pain, depression, panic attacks, juvenile cataracts w/ minimal vision right, hypothyroid, PNA 07/2023 followed by onset of numbness, tingling, burning in feet/toes progressing to bilateral hands by the end of the month and progressed to gait disorder. He was readmitted 11/15-11/17/24 for sepsis due to PNA. He was evaluated by Dr. Onita with EEG 12/18 revealing acute demyelinating polyradiculopathy. He was sent to hospital for treatment and starated on IVIG the same day.  NIF -40 with VC 4.46 initially-->-40/2.5 L on next check. He was noted to have respiratory alkalosis with low BP and indapamide  and irbesartan  held.  On 12/19, he was noted to have left facial weakness concerning for possible blood clotting SE as well as new oxygen requirement and potential stroke and MRI brain ordered which was negative.    IVIG resumed and he has completed 5 day course on 12/22.  NIF/VC being monitored when patient willing and last check @ -40/2.5 L. He has had gradual improvement in strength but not back to baseline.  Notes indicate plans for LP X 2 on 12/31 at [facility] neurology? BP trending back up and hyponatremia stable. Therapy has been working with patient who requires mod to max assist with tendency for knees to buckle and shuffling gait. CIR recommended due to functional decline.  Patient transferred to CIR on 10/05/2023 .    Pt presents with decreased activity tolerance, decreased functional mobility, decreased balance, decreased coordination, feelings of stress Limiting pt's independence with leisure/community pursuits. Met with pt today to discuss TR services including leisure education, activity analysis/modifications and stress management.  Also discussed the importance of social, emotional, spiritual health in addition to physical health and their effects on overall health and wellness.  Pt stated understanding and requesting neuropsych consult for coping and resources/support beyond discharge.  SW made aware.  Provided pt with handouts on activity analysis and stress management/coping.  Pt appreciative of this education.    Plan  No further TR as pt is expected to d/c 1/7  Recommendations for other services: Neuropsych  Discharge Criteria: Patient will be discharged from TR if patient refuses treatment 3 consecutive times without medical reason.  If treatment goals not met, if there is a change in medical status, if patient makes no progress towards goals or if patient is  discharged from hospital.  The above assessment, treatment plan, treatment alternatives and goals were discussed and mutually agreed upon: by patient  Nelline Lio 10/15/2023, 3:19 PM

## 2023-10-15 NOTE — Progress Notes (Signed)
 Patient ID: Jonathan Luna, male   DOB: [birth year], 58 y.o.   MRN: [id number]  Therapy recommends a rollator since seems to work best for him. Have made referral to [employer] for this item and will await pt's decision on tub bench since a private pay item and they had purchased one already.

## 2023-10-15 NOTE — Progress Notes (Signed)
 Occupational Therapy Session Note  Patient Details  Name: Jonathan Luna MRN: [id number] Date of Birth: 03-06-1966  Today's Date: 10/15/2023 OT Individual Time: 8984-8884 OT Individual Time Calculation (min): 60 min    Short Term Goals: Week 1:  OT Short Term Goal 1 (Week 1): Pt will complete dynamic standing during ADLs at [facility] with LRAD OT Short Term Goal 1 - Progress (Week 1): Met OT Short Term Goal 2 (Week 1): Pt will complete LB dressing at [facility] with AE as necessary OT Short Term Goal 2 - Progress (Week 1): Met OT Short Term Goal 3 (Week 1): Pt will complete bathing at [facility] with AE as necessary OT Short Term Goal 3 - Progress (Week 1): Met Week 2:  OT Short Term Goal 1 (Week 2): STG=LTG d/t ELOS   Skilled Therapeutic Interventions/Progress Updates:    1:1 Pt received in the w/c. Pt trailing a pair of AFOs from his previous PT session. Also discussed fall precautions at home and recommendations for some device to prevent falls. Discussed option of AFO or foot up brace due to having some active dorsiflexion to encourage some active movement. Trial of foot up brace on left LE but difficulty due to his type of tongue and laces in his shoe. Pt continues with LOB with turns and stepping backwards. Practiced transitional movements and body weighted strengthening. Practiced going from standing to tall kneeling on mat without UE support and back to standing with min A. Then transitioned into quadriped- on all fours- to then rocking forward and then back into child's pose. While in table top position pt lifted one extremity at a time and then lifted opposite upper and lower extremities simultaneously (bird dog). Also performed picking an item off the floor from standing position with min A with each hand 4 times at a slow speed. Ambulated back to room with contact guard.   Also applied mole skin to bilateral dorsal support braces for comfort as requested.   Therapy Documentation Precautions:   Precautions Precautions: Fall Restrictions Weight Bearing Restrictions Per Provider Order: No General:   Vital Signs: Therapy Vitals Temp: 97.7 F (36.5 C) Temp Source: Oral Pulse Rate: 81 Resp: 17 BP: 130/77 Patient Position (if appropriate): Sitting Oxygen Therapy SpO2: 97 % O2 Device: Room Air Pain:  Minor pain in left thumb but able to continue as able- rest breaks needed prn   Therapy/Group: Individual Therapy  Claudene Nest [facility] 10/15/2023, 2:00 PM

## 2023-10-16 DIAGNOSIS — G610 Guillain-Barre syndrome: Secondary | ICD-10-CM | POA: Diagnosis not present

## 2023-10-16 DIAGNOSIS — F54 Psychological and behavioral factors associated with disorders or diseases classified elsewhere: Secondary | ICD-10-CM | POA: Diagnosis not present

## 2023-10-16 NOTE — Progress Notes (Signed)
 Physical Therapy Session Note  Patient Details  Name: Jonathan Luna MRN: [id number] Date of Birth: [birth year]  Today's Date: 10/16/2023 PT Individual Time: 1300-1412 PT Individual Time Calculation (min): 72 min   Short Term Goals: Week 2:  PT Short Term Goal 1 (Week 2): STG's=LTG's due to ELOS  Skilled Therapeutic Interventions/Progress Updates:      Pt sitting in wheelchair to start - agreeable to therapy treatment. No reports of pain. Not wearing an AFO's.  Sit<>Stand to rollator with supervision - ambulated 196ft to main rehab gym at supervision level using the rollator. CPO present and discussed deficits and needs.   Focused session on completed AFO consult with CPO Olivia from [facility]. CPO in agreement of needs and recommending B AFO's (PLS ottobok) with lateral struts. Educated pt on wear schedule and provided hand out to supplement education.   Worked on B ankle strength and provided below HEP for home with a blue TB resistance. Completed HEP below to ensure understanding of dosage, sequencing, and technique.   Access Code: [email or web address] URL: [email or web address] Date: 10/16/2023 Prepared by: Sherlean Perks  Exercises - Ankle Inversion with Resistance  - 1 x daily - 7 x weekly - 3 sets - 12 reps - Ankle Eversion with Resistance  - 1 x daily - 7 x weekly - 3 sets - 12 reps - Ankle Dorsiflexion with Resistance  - 1 x daily - 7 x weekly - 3 sets - 12 reps - Ankle and Toe Plantarflexion with Resistance  - 1 x daily - 7 x weekly - 3 sets - 12 reps - Seated Ankle Alphabet  - 1 x daily - 7 x weekly - 3 sets - 12 reps  Finished session with working on calf stretching using 3 stair and 2 hand rails for body support. Educated on gastroc vs soleus stretching and completed 3x15 second stretches for each.   Ambulated back to his room with supervision assist and rollator, ~161ft. Noted B foot slap (L > R) with gait, poor eccentric control of DF lowering during gait.    Ended treatment sitting in wheelchair, all needs met.    Therapy Documentation Precautions:  Precautions Precautions: Fall Restrictions Weight Bearing Restrictions Per Provider Order: No General:      Therapy/Group: Individual Therapy  Sherlean SHAUNNA Perks 10/16/2023, 7:53 AM

## 2023-10-16 NOTE — Progress Notes (Signed)
 PROGRESS NOTE   Subjective/Complaints:  Pt asking again about tub bench- asked him to speak to PT/OT about options.  Feet cold this AM- was planning on asking for prn gabapentin - explained will take Duloxetine  until ~ Monday/Tuesday until kicks in.   LBM this AM.  Eating greens nightly.    ROS:    Pt denies SOB, abd pain, CP, N/V/C/D, and vision changes   Negative except for HPI Objective:   No results found.  Recent Labs    10/15/23 0610  WBC 5.9  HGB 14.4  HCT 42.2  PLT 384    Recent Labs    10/15/23 0610  NA 138  K 4.2  CL 102  CO2 24  GLUCOSE 97  BUN 9  CREATININE 0.77  CALCIUM  9.5     Intake/Output Summary (Last 24 hours) at 10/16/2023 0820 Last data filed at 10/15/2023 1806 Gross per 24 hour  Intake 480 ml  Output --  Net 480 ml        Physical Exam: Vital Signs Blood pressure 130/74, pulse 72, temperature (!) 97.4 F (36.3 C), temperature source Oral, resp. rate 18, height 5' 7 (1.702 m), weight 83.5 kg, SpO2 93%.        General: awake, alert, appropriate, sitting in w/c- with legs propped again this AM; NAD HENT: conjugate gaze; oropharynx moist CV: regular rate and rhythm; no JVD Pulmonary: CTA B/L; no W/R/R- good air movement GI: soft, NT, ND, (+)BS Psychiatric: appropriate- less anxious this AM Neurological: Ox3   No change in strength in Ue's except wrists appears weaker- ~ 3-/5 B/L- BUT OTHERWISE THE SAME Exam done 12/31- Biceps/triceps 5/5; WE on R 4-/5; L 3+/5; Grip 4/5 on R and 4-/5 on L; FA 2+/5 LE's HF/KE 5-/5; DF 4/5 and PF 4+/5 - is better today NMSK- calves tight still- posteriorly- but not tone 12/30 RUE- biceps/triceps 5/5; WE 4-/5; Grip 4/5; FA 2+/5 LUE- biceps/triceps 5/5; WE 3+/5; Grip 4-/5 and FA 2+/5 LE's- HF/KE 5-/5; DF 4-/5; and PF 4/5 MSK- tight calves- B/L Ext: no clubbing, cyanosis, or edema Psych: pleasant and cooperative   Skin: No evidence  of breakdown, no evidence of rash  MSK:      No apparent deformity.  ?  Mild left upper extremity drop arm, needs to hike shoulder to initiate.      Bilateral second and third digits with decreased range of motion in finger flexion, extension.  No palpable cyst, contracture, or trigger finger.   Neurologic exam:  Cognition: AAO to person, place, time and event.   CN: ? left facial droop as above, otherwise intact. Dysconjugate gaze Coordination: Bilateral upper extremity intention tremors Spasticity: no abnl tone  Strength:                RUE: 5/5 SA, 5/5 EF, 5/5 EE, 4/5 WE, 3+/5 FA, 4/5 FF                LUE:  5/5 SA, 5/5 EF, 5/5 EE, 4/5 WE, +/5 FA, 4/5 FF                RLE: 5/5 HF, 5/5 KE, 4/5  DF, 235  EHL, 4/5  PF                 LLE:  5/5 HF, 5/5 KE, 4/5  DF, 3/5  EHL, 4/5  PF --motor exam actually better.    Sensation intact to LT and Proprio B UE and LE except along fingers and toes/soles of feet. No changes with sensation    Assessment/Plan: 1. Functional deficits which require 3+ hours per day of interdisciplinary therapy in a comprehensive inpatient rehab setting. Physiatrist is providing close team supervision and 24 hour management of active medical problems listed below. Physiatrist and rehab team continue to assess barriers to discharge/monitor patient progress toward functional and medical goals  Care Tool:  Bathing    Body parts bathed by patient: Right arm, Left lower leg, Face, Left arm, Chest, Abdomen, Front perineal area, Buttocks, Right upper leg, Left upper leg, Right lower leg         Bathing assist Assist Level: Minimal Assistance - Patient > 75%     Upper Body Dressing/Undressing Upper body dressing   What is the patient wearing?: Pull over shirt    Upper body assist Assist Level: Supervision/Verbal cueing    Lower Body Dressing/Undressing Lower body dressing            Lower body assist Assist for lower body dressing: Moderate Assistance -  Patient 50 - 74%     Toileting Toileting    Toileting assist Assist for toileting: Contact Guard/Touching assist     Transfers Chair/bed transfer  Transfers assist  Chair/bed transfer activity did not occur: Refused (patient did not get up)  Chair/bed transfer assist level: Minimal Assistance - Patient > 75%     Locomotion Ambulation   Ambulation assist      Assist level: Minimal Assistance - Patient > 75% Assistive device: No Device Max distance: 180   Walk 10 feet activity   Assist     Assist level: Minimal Assistance - Patient > 75% Assistive device: No Device   Walk 50 feet activity   Assist    Assist level: Minimal Assistance - Patient > 75% Assistive device: No Device    Walk 150 feet activity   Assist    Assist level: Minimal Assistance - Patient > 75% Assistive device: No Device    Walk 10 feet on uneven surface  activity   Assist Walk 10 feet on uneven surfaces activity did not occur: Safety/medical concerns (fatigue)         Wheelchair     Assist Is the patient using a wheelchair?: Yes Type of Wheelchair: Manual    Wheelchair assist level: Supervision/Verbal cueing Max wheelchair distance: 150    Wheelchair 50 feet with 2 turns activity    Assist        Assist Level: Supervision/Verbal cueing   Wheelchair 150 feet activity     Assist      Assist Level: Supervision/Verbal cueing   Blood pressure 130/74, pulse 72, temperature (!) 97.4 F (36.3 C), temperature source Oral, resp. rate 18, height 5' 7 (1.702 m), weight 83.5 kg, SpO2 93%.  Medical Problem List and Plan: 1. 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 Onita contacted us  and I put in order for LP to be performed Monday thereabouts.  Spoke to pt about this today 12/28.       -12/31- pt says feels stronger today, but concerned might have MS  We discussed at length how he's doing well- breathing tests great- and strength exam actually better  -LP just shows AIDP per Dr Georgianne note and also d/w Neuro on call   1/1- pt doing great on NIFs and VC- explained that unless those go down or a dramatic change in strength- won't do additional treatment- pt appears to want more treatment to speed up recovery, which I understand, however it doesn't usually do so in the literature.   Con't CIR PT and OT  Ordered AFO consult for B/L AFO's 2.  Antithrombotics: -DVT/anticoagulation:  Pharmaceutical: Lovenox              -antiplatelet therapy: N/A   3.Chronic LBP/Pain Management: Continue oxycodone  prn-->managed by PCP             --on Gabapentin  100 mg BID for neuropathy -> Per discussion with patient on admission, ongoing hypersensitivity on bottom of feet, discussed gabapentin  adjustment vs. Lyrica, opted to switch to gabapentin  300 mg nightly.   12/24- will change Gabapentin  to 400 mg TID for now- can titrate up more if needed; will also add Robaxin  500 mg q6 hours prn for muscle spasms 12/28- adjusted gabapentin  to 600mg  tid --it's helping  -adjusted robaxin  to 1000mg  q6 prn  -ordered kpad  -discussed pm and am stretching of calves and hamstrings  12/29--kpad helping   -will increase gabapentin  to 600mg  qid   -reassured pt re: presentation, anxiety is an issue here  12/30- pt having 7-8/10 back pain, but is normal for him- due to OA in his low back- educated pt on how to treat muscle spasms in calves  12/31- changed Robaxin  to Zanaflex  4 mg BID- first dose at 630 am  1/1- Pt reports muscle spasms slightly better but still twinging this AM  1/2- added Duloxetine  30 mg at bedtime and will increase in 3 days if tolerated  -also added gabapentin  600 mg qday prn for times when nerve pain worse (feeling cold hands an  1/3- explained will  take til Monday/Tuesday til any improvement in nerve pain from Duloxetine - con't gabapentin  and prn gabapentin  as well 4. Mood/Behavior/Sleep: Stable on Zoloft  with low dose klonopin  prn. LCSW to follow for evaluation and support.              -antipsychotic agents: N/A             --has been using Klonopin  for sleep   5. Neuropsych/cognition: This patient is capable of making decisions on his own behalf. 6. Skin/Wound Care: Routine pressure relief measures.  7. Fluids/Electrolytes/Nutrition: Monitor I/O. Check CMET in am 8. HTN: Monitor BP TID--continue  metoprolol  and irbesartan   1/1-1/3BP controlled- con't regimen 9. Hyponatremia: resolved     Latest Ref Rng & Units 10/15/2023    6:10 AM 10/12/2023    5:52 AM 10/06/2023    4:56 AM  BMP  Glucose 70 - 99 mg/dL 97  86  93   BUN 6 - 20 mg/dL 9  10  9    Creatinine 0.61 - 1.24 mg/dL 9.22  9.20  9.31   Sodium 135 - 145 mmol/L 138  137  142   Potassium 3.5 - 5.1  mmol/L 4.2  4.5  3.9   Chloride 98 - 111 mmol/L 102  102  108   CO2 22 - 32 mmol/L 24  25  24    Calcium  8.9 - 10.3 mg/dL 9.5  9.4  9.1     10. H/o anxiety disorder: Off Buspar  as was ineffective. Continue Klonopin  bid prn  1/3- takes Klonopin  1-2x/day 11. H/o Depression: Managed with low dose Zoloft .  12. Hypothyroid: Continue synthroid  125 mc daily for supplement. 1/2- pt askin gif can rescheck labs- explained that shouldn't check closer than 3 months- also explained that in acute setting- like being ill/having GBS, can make TSH labs haywire- needs to be checked out of hospital.    13. Cough/wheezing  12/24- no elevated WBC; Afebrile- will treat Sx's- with Mucinex  600 mg BID, Tessalon  pearls scheduled 100 mg TID for 5 days and Albuterol  inhaler, per pt request, not nebs-   12/29--sx have largely resolved  1/1- pt reports still has cough- asking for inhaler- which I agree with- had pneumonia 2 months ago- and said cough hasn't resolved so far.   1/2- doing a little better with  albuterol  inhaler- doesn't like nebs- tried in past- will restart Tessalon  pearls 100 mg TID for 5 days.   1/3- denies as much cough this AM  I spent a total of 35   minutes on total care today- >50% coordination of care- due to  D/w pt at length about therapy issues, nerve pain and d/w nursing about overall plan.      LOS: 11 days A FACE TO FACE EVALUATION WAS PERFORMED  Jamoni Broadfoot 10/16/2023, 8:20 AM

## 2023-10-16 NOTE — Progress Notes (Signed)
 Physical Therapy Session Note  Patient Details  Name: Jonathan Luna MRN: [id number] Date of Birth: [birth year]  Today's Date: 10/16/2023 PT Individual Time: 0800-0915 PT Individual Time Calculation (min): 75 min   Short Term Goals: Week 2:  PT Short Term Goal 1 (Week 2): STG's=LTG's due to ELOS  Skilled Therapeutic Interventions/Progress Updates:    Pt seated in w/c on arrival and agreeable to therapy. Pt reports chronic back pain, unrated and required no intervention at this time. Pt with questions about strengthening his wrist extensors. Instructed pt on body weight exercise and performing against gravity for full benefit of activity. Pt also with questions about e stim, so discussed that evidence states e stim + exercise is beneficial. Pt would like to continue working with this tool as he is considering saebo/other e stim unit for home.   ambulatory transfer to bathroom with rollator. Supervision for 3/3 toileting tasks. Continent urine void.   Pt set up with nexwave device on NMES setting with pads on anterior tib BLE.  X 20 min  20:10 RLE: 62mA LLE: 56 mA Pt participated in active dorsiflexion in standing (with marching) and in sitting, followed by gait with estim in place. Discussed benefit of working on DF with plan to get AFOs and benefit of using AFO to normalize gait and prevent falls while continuing to work on DF. Pt with some pain on LLE, which relieved by adjusting pad. Therapist assessed skin after removal and found all sites to be intact and without redness.    Pt then used agility ladder for balance and increased effort towards DF. Step to pattern with HHA, progressed to step through using trekking poles for increased stability. Pt demoed improving gait pattern with heel toe and foot clearance.   At end of session pt returned to bed with supervision, and was set up with unattended on BUE wrist extensors with setting as described below. X 60 min 20:10 RUE 21mA LUE:29 mA Pt  instructed to perform active wrist extension during on cycle against gravity. Therapist returned later to collect device and inspected skin, with all sites found to be intact and without redness. Pt remained in bed and was left with all needs in reach and alarm active.   Therapy Documentation Precautions:  Precautions Precautions: Fall Restrictions Weight Bearing Restrictions Per Provider Order: No General:       Therapy/Group: Individual Therapy  Schuyler JAYSON Batter 10/16/2023, 4:55 PM

## 2023-10-16 NOTE — Progress Notes (Signed)
 Occupational Therapy Session Note  Patient Details  Name: Jonathan Luna MRN: [id number] Date of Birth: 1966/02/04  Today's Date: 10/16/2023 OT Individual Time: 1110-1220 OT Individual Time Calculation (min): 70 min    Short Term Goals: Week 2:  OT Short Term Goal 1 (Week 2): STG=LTG d/t ELOS  Skilled Therapeutic Interventions/Progress Updates:    Patient received supine in bed with estim on bilateral forearms.  Patient really wanting to shower and indicates he can do most of it on his own.  Patient able to explain process, and move safely in room with rollator.  Walked to [employer] to retrieve clean clothing, Needed cueing at times for logistics of positioning walker safely.   Stood at toilet to void, Patient walked in bathroom a few steps with use of light contact at grab bar.  Able to wash himself with increased time - asking questions about safe use of tub transfer bench at home to avoid getting floor wet.  Able to dress himself after set up.  Patient asking for help to don socks - but when encouraged - able to don socks shoes without assistance.   Patient being assessed for AFO's this afternoon.  Will need to practice donning/ doffing AFO's as needed.  Left up in wheelchair for lunch with personal items in reach.    Therapy Documentation Precautions:  Precautions Precautions: Fall Restrictions Weight Bearing Restrictions Per Provider Order: No   Pain: Pain Assessment Pain Scale: 0-10 Pain Score: 7  Pain Type: Acute pain Pain Location: Back Pain Descriptors / Indicators: Discomfort Pain Onset: On-going Pain Intervention(s): Medication (See eMAR)     Therapy/Group: Individual Therapy  Jevon Shells M 10/16/2023, 1:14 PM

## 2023-10-16 NOTE — Progress Notes (Signed)
 RT attempted NIF and VC but Pt unavailable. RT will try again at a later time if schedule permits

## 2023-10-16 NOTE — Progress Notes (Signed)
 Patient ID: Jonathan Luna, male   DOB: 09-15-1966, 58 y.o.   MRN: [id number]  Met with pt to see if wants worker to order him a tub bench. He feels since not covered he will deal with the one he has at home. Discussed OP at [facility] and will be called for follow up appointments. Ready for discharge on Tuesday

## 2023-10-16 NOTE — Progress Notes (Signed)
 RT attempted NIF/VC x3 but Pt not in room at this time.

## 2023-10-17 DIAGNOSIS — G610 Guillain-Barre syndrome: Secondary | ICD-10-CM | POA: Diagnosis not present

## 2023-10-17 NOTE — Progress Notes (Signed)
 PROGRESS NOTE   Subjective/Complaints:  No acute complaints.  No events overnight.  NIFs remain -40, VC 1.8, approrpaite.  Other labs, vital stable. Planing of 8 out of 10 back pain overnight.  Patient today concerned regarding prior TSH level of 8 12-8.  States this was an outpatient workup for his symptoms, saw PCP who did not make medication adjustments, and was told that he would have to wait 3 months for follow-up.  He is concerned that this is affecting his energy level and weakness.  He asks for chart review and consideration of increasing his thyroid  medication.  ROS:  +back pain + neck pain  Pt denies SOB, abd pain, CP, N/V/C/D, and vision changes   Negative except for HPI Objective:   No results found.  Recent Labs    10/15/23 0610  WBC 5.9  HGB 14.4  HCT 42.2  PLT 384    Recent Labs    10/15/23 0610  NA 138  K 4.2  CL 102  CO2 24  GLUCOSE 97  BUN 9  CREATININE 0.77  CALCIUM  9.5     Intake/Output Summary (Last 24 hours) at 10/17/2023 0933 Last data filed at 10/17/2023 0739 Gross per 24 hour  Intake 477 ml  Output --  Net 477 ml        Physical Exam: Vital Signs Blood pressure 139/80, pulse 72, temperature 97.7 F (36.5 C), temperature source Oral, resp. rate 18, height 5' 7 (1.702 m), weight 83.5 kg, SpO2 96%.        General: awake, alert, appropriate.  In bed.  No acute distress. HENT: conjugate gaze; oropharynx moist CV: regular rate and rhythm; no JVD Pulmonary: CTA B/L; no W/R/R- good air movement GI: soft, NT, ND, (+)BS Psychiatric: appropriate- less anxious this AM Neurological: Ox3 Moving all 4 limbs antigravity against resistance.  No appreciable change from prior exams.  Prior: No change in strength in Ue's except wrists appears weaker- ~ 3-/5 B/L- BUT OTHERWISE THE SAME  Exam done 12/31- Biceps/triceps 5/5; WE on R 4-/5; L 3+/5; Grip 4/5 on R and 4-/5 on L; FA  2+/5 LE's HF/KE 5-/5; DF 4/5 and PF 4+/5 - is better today NMSK- calves tight still- posteriorly- but not tone 12/30 RUE- biceps/triceps 5/5; WE 4-/5; Grip 4/5; FA 2+/5 LUE- biceps/triceps 5/5; WE 3+/5; Grip 4-/5 and FA 2+/5 LE's- HF/KE 5-/5; DF 4-/5; and PF 4/5 MSK- tight calves- B/L Ext: no clubbing, cyanosis, or edema Psych: pleasant and cooperative   Skin: No evidence of breakdown, no evidence of rash  MSK:      No apparent deformity.  ?  Mild left upper extremity drop arm, needs to hike shoulder to initiate.      Bilateral second and third digits with decreased range of motion in finger flexion, extension.  No palpable cyst, contracture, or trigger finger.   Neurologic exam:  Cognition: AAO to person, place, time and event.   CN: ? left facial droop as above, otherwise intact. Dysconjugate gaze Coordination: Bilateral upper extremity intention tremors Spasticity: no abnl tone  Strength:                RUE: 5/5 SA,  5/5 EF, 5/5 EE, 4/5 WE, 3+/5 FA, 4/5 FF                LUE:  5/5 SA, 5/5 EF, 5/5 EE, 4/5 WE, +/5 FA, 4/5 FF                RLE: 5/5 HF, 5/5 KE, 4/5  DF, 235  EHL, 4/5  PF                 LLE:  5/5 HF, 5/5 KE, 4/5  DF, 3/5  EHL, 4/5  PF --motor exam actually better.    Sensation intact to LT and Proprio B UE and LE except along fingers and toes/soles of feet. No changes with sensation    Assessment/Plan: 1. Functional deficits which require 3+ hours per day of interdisciplinary therapy in a comprehensive inpatient rehab setting. Physiatrist is providing close team supervision and 24 hour management of active medical problems listed below. Physiatrist and rehab team continue to assess barriers to discharge/monitor patient progress toward functional and medical goals  Care Tool:  Bathing    Body parts bathed by patient: Right arm, Left lower leg, Face, Left arm, Chest, Abdomen, Front perineal area, Buttocks, Right upper leg, Left upper leg, Right lower leg          Bathing assist Assist Level: Minimal Assistance - Patient > 75%     Upper Body Dressing/Undressing Upper body dressing   What is the patient wearing?: Pull over shirt    Upper body assist Assist Level: Supervision/Verbal cueing    Lower Body Dressing/Undressing Lower body dressing            Lower body assist Assist for lower body dressing: Moderate Assistance - Patient 50 - 74%     Toileting Toileting    Toileting assist Assist for toileting: Contact Guard/Touching assist     Transfers Chair/bed transfer  Transfers assist  Chair/bed transfer activity did not occur: Refused (patient did not get up)  Chair/bed transfer assist level: Minimal Assistance - Patient > 75%     Locomotion Ambulation   Ambulation assist      Assist level: Minimal Assistance - Patient > 75% Assistive device: No Device Max distance: 180   Walk 10 feet activity   Assist     Assist level: Minimal Assistance - Patient > 75% Assistive device: No Device   Walk 50 feet activity   Assist    Assist level: Minimal Assistance - Patient > 75% Assistive device: No Device    Walk 150 feet activity   Assist    Assist level: Minimal Assistance - Patient > 75% Assistive device: No Device    Walk 10 feet on uneven surface  activity   Assist Walk 10 feet on uneven surfaces activity did not occur: Safety/medical concerns (fatigue)         Wheelchair     Assist Is the patient using a wheelchair?: Yes Type of Wheelchair: Manual    Wheelchair assist level: Supervision/Verbal cueing Max wheelchair distance: 150    Wheelchair 50 feet with 2 turns activity    Assist        Assist Level: Supervision/Verbal cueing   Wheelchair 150 feet activity     Assist      Assist Level: Supervision/Verbal cueing   Blood pressure 139/80, pulse 72, temperature 97.7 F (36.5 C), temperature source Oral, resp. rate 18, height 5' 7 (1.702 m), weight 83.5 kg, SpO2  96%.  Medical Problem List and  Plan: 1. 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 Onita contacted us  and I put in order for LP to be performed Monday thereabouts. Spoke to pt about this today 12/28.       -12/31- pt says feels stronger today, but concerned might have MS  We discussed at length how he's doing well- breathing tests great- and strength exam actually better  -LP just shows AIDP per Dr Georgianne note and also d/w Neuro on call   1/1- pt doing great on NIFs and VC- explained that unless those go down or a dramatic change in strength- won't do additional treatment- pt appears to want more treatment to speed up recovery, which I understand, however it doesn't usually do so in the literature.   Con't CIR PT and OT  Ordered AFO consult for B/L AFO's 2.  Antithrombotics: -DVT/anticoagulation:  Pharmaceutical: Lovenox              -antiplatelet therapy: N/A   3.Chronic LBP/Pain Management: Continue oxycodone  prn-->managed by PCP             --on Gabapentin  100 mg BID for neuropathy -> Per discussion with patient on admission, ongoing hypersensitivity on bottom of feet, discussed gabapentin  adjustment vs. Lyrica, opted to switch to gabapentin  300 mg nightly.   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 Mood/Behavior/Sleep: Stable on Zoloft  with low dose klonopin  prn. LCSW to follow for evaluation and support.              -antipsychotic agents: N/A             --has been using Klonopin  for sleep   5. Neuropsych/cognition: This patient is capable of making decisions on his own behalf. 6. Skin/Wound Care: Routine pressure relief measures.  7. Fluids/Electrolytes/Nutrition: Monitor I/O. Check CMET in am 8. HTN: Monitor BP TID--continue  metoprolol  and irbesartan   1/1-1/4 BP controlled- con't regimen 9. Hyponatremia: resolved     Latest Ref Rng & Units 10/15/2023    6:10 AM 10/12/2023  5:52 AM 10/06/2023    4:56 AM  BMP  Glucose 70 - 99 mg/dL 97  86  93   BUN 6 - 20 mg/dL 9  10  9    Creatinine 0.61 - 1.24 mg/dL 9.22  9.20  9.31   Sodium 135 - 145 mmol/L 138  137  142   Potassium 3.5 - 5.1 mmol/L 4.2  4.5  3.9   Chloride 98 - 111 mmol/L 102  102  108   CO2 22 - 32 mmol/L 24  25  24    Calcium  8.9 - 10.3 mg/dL 9.5  9.4  9.1     10. H/o anxiety disorder: Off Buspar  as was ineffective. Continue Klonopin  bid prn  1/3- takes Klonopin  1-2x/day 11. H/o Depression: Managed with low dose Zoloft .  12. Hypothyroid: Continue synthroid  125 mc daily for supplement. 1/2- pt askin gif can rescheck labs- explained that shouldn't check closer than 3 months- also explained that in acute setting- like being ill/having GBS, can make TSH labs haywire- needs to be checked out of hospital.   1/4: Per patient  request, reviewed chart; PCP indicated both over and undertreated hypothyroidism on recent labs, had recommended better compliance with T4.  Agree with considerable variability recently as below.   - dose of thyroid  med changed from 150 mg to 125 mg 11/22; has been approximately 1.5 months since then - no s/s toxic thyroid , can recheck TSH/free T4 in AM but if not significantly uptrending agree with waiting for 3 months for further adjustment    13. Cough/wheezing  12/24- no elevated WBC; Afebrile- will treat Sx's- with Mucinex  600 mg BID, Tessalon  pearls scheduled 100 mg TID for 5 days and Albuterol  inhaler, per pt request, not nebs-   12/29--sx have largely resolved  1/1- pt reports still has cough- asking for inhaler- which I agree with- had pneumonia 2 months ago- and said cough hasn't resolved so far.   1/2- doing a little better with albuterol  inhaler- doesn't like nebs- tried in past- will restart Tessalon  pearls 100 mg TID for 5 days.   1/3- denies as much cough this AM - stable    LOS: 12 days A FACE TO FACE EVALUATION WAS PERFORMED  Joesph JAYSON Likes 10/17/2023, 9:33 AM

## 2023-10-17 NOTE — Progress Notes (Signed)
 Physical Therapy Session Note  Patient Details  Name: Jonathan Luna MRN: [id number] Date of Birth: 10/07/1966  Today's Date: 10/17/2023 PT Individual Time: 1305-1403 PT Individual Time Calculation (min): 58 min   Short Term Goals: Week 2:  PT Short Term Goal 1 (Week 2): STG's=LTG's due to ELOS  Skilled Therapeutic Interventions/Progress Updates: Patient sitting in [facility] with NT obtaining vitals on entrance to room. Patient alert and agreeable to PT session.   Patient reported nausea due to change in medication (no emetic episode throughout session and pt reported decrease in nausea following interventions). Pt had questions regarding nutrition due to wanting to get back into the gym when later after d/c. PTA gave personal knowledge of questions asked, but still greately encouraged pt to bring this to attending medical team attention for interactive safety of supplements (creatine and protein powder).   Therapeutic Activity: Transfers: Pt performed sit<>stand transfers throughout session with rollator and with supervision.  Pt ambulated from room<>day room using rollator with CGA (no AFO donned). VC for pt to bring 5th digit of B feet towards lateral side (wheel of rollator) in order to increase eversion due to inverted presentation.   Neuromuscular Re-ed: NMR facilitated during session with focus on neuromuscular control/coordination, dynamic standing balance. - Pt supine on hi/low mat performing dorsiflexion B LE with yellow theraband with primary focus on eccentric and 3 second pause at peak available AROM (R LE has greater strength and ROM vs L). Pt performed 2 sets x 8 reps  Pt educated on eccentric control of DF to decrease foot slap during gait - Pt stepping to 2 step with yellow theraband tied around dumbbell weights for base stabilization on medial and lateral aspect of foot (started with L LE until fatigue, then switched to R LE). Pt cued to avoid sliding foot when stepping off for  controlled dorsiflexion and retro-step. Pt required modA to maintain standing balance throughout.   NMR performed for improvements in motor control and coordination, balance, sequencing, judgement, and self confidence/ efficacy in performing all aspects of mobility at highest level of independence.   Therapeutic Exercise: Pt performed the following exercise in order to increase/maintain cardiovascular endurance + B LE strengthening with increased resistance - NuStep on level 8 for 5 min at end of session with instructions to maintain greater than 50 spm. Pt performed 267 steps and reported 8 /10 on face scale with HR 102 bpm, and SpO2 99%.   Patient sitting in WC at end of session with brakes locked, and all needs within reach.      Therapy Documentation Precautions:  Precautions Precautions: Fall Restrictions Weight Bearing Restrictions Per Provider Order: No    Therapy/Group: Individual Therapy  Aurianna Earlywine PTA 10/17/2023, 3:15 PM

## 2023-10-17 NOTE — Progress Notes (Signed)
 VC 1.8 NIF -40

## 2023-10-18 DIAGNOSIS — F54 Psychological and behavioral factors associated with disorders or diseases classified elsewhere: Secondary | ICD-10-CM

## 2023-10-18 DIAGNOSIS — G610 Guillain-Barre syndrome: Secondary | ICD-10-CM | POA: Diagnosis not present

## 2023-10-18 LAB — TSH: TSH: 3.174 u[IU]/mL (ref 0.350–4.500)

## 2023-10-18 LAB — T4, FREE: Free T4: 0.98 ng/dL (ref 0.61–1.12)

## 2023-10-18 NOTE — Progress Notes (Signed)
NIF-40 VC 2.8

## 2023-10-18 NOTE — Progress Notes (Signed)
 PROGRESS NOTE   Subjective/Complaints:  No acute complaints.  No events overnight.  NIFs remain -40 Other vital stable overnight. Patient seen working with OT; no complaints or concerns today.  Did briefly discuss thyroid  test, patient understands results and is appreciative.  ROS:   Pt denies SOB, abd pain, CP, N/V/C/D, and vision changes   Negative except for HPI Objective:   No results found.  No results for input(s): WBC, HGB, HCT, PLT in the last 72 hours.   No results for input(s): NA, K, CL, CO2, GLUCOSE, BUN, CREATININE, CALCIUM  in the last 72 hours.    Intake/Output Summary (Last 24 hours) at 10/18/2023 2147 Last data filed at 10/18/2023 1816 Gross per 24 hour  Intake 720 ml  Output --  Net 720 ml        Physical Exam: Vital Signs Blood pressure 113/80, pulse 86, temperature 98.1 F (36.7 C), resp. rate 18, height 5' 7 (1.702 m), weight 83.5 kg, SpO2 95%.  General: awake, alert, appropriate.  Sitting up in wheelchair.  No acute distress. HENT: conjugate gaze; oropharynx moist + glasses CV: regular rate and rhythm; no JVD Pulmonary: CTA B/L; no W/R/R- good air movement GI: soft, NT, ND, (+)BS Skin: No evidence of breakdown, no evidence of rash MSK: No apparent deformities. Psychiatric: Appropriate mood and affect. Neurological: Ox3.  Sensation grossly intact.  Cranial nerves grossly intact.      Strength:                RUE: 5/5 SA, 5/5 EF, 5/5 EE, 4-/5 WE, 4-/5 FF, 4-/5 FA                LUE:  5/5 SA, 5/5 EF, 5/5 EE, 4-/5 WE, 4-/5 FF, 4-/5 FA                RLE: 5/5 HF, 5/5 KE, 5/5  DF,, 3+/5  PF                 LLE:  5/5 HF, 5/5 KE, 5/5  DF, 5/5  EHL, 2+/5  PF     Prior: No change in strength in Ue's except wrists appears weaker- ~ 3-/5 B/L- BUT OTHERWISE THE SAME  Exam done 12/31- Biceps/triceps 5/5; WE on R 4-/5; L 3+/5; Grip 4/5 on R and 4-/5 on L; FA 2+/5 LE's  HF/KE 5-/5; DF 4/5 and PF 4+/5 - is better today NMSK- calves tight still- posteriorly- but not tone 12/30 RUE- biceps/triceps 5/5; WE 4-/5; Grip 4/5; FA 2+/5 LUE- biceps/triceps 5/5; WE 3+/5; Grip 4-/5 and FA 2+/5 LE's- HF/KE 5-/5; DF 4-/5; and PF 4/5 MSK- tight calves- B/L Ext: no clubbing, cyanosis, or edema Psych: pleasant and cooperative        Assessment/Plan: 1. Functional deficits which require 3+ hours per day of interdisciplinary therapy in a comprehensive inpatient rehab setting. Physiatrist is providing close team supervision and 24 hour management of active medical problems listed below. Physiatrist and rehab team continue to assess barriers to discharge/monitor patient progress toward functional and medical goals  Care Tool:  Bathing    Body parts bathed by patient: Right arm, Left lower leg, Face, Left arm,  Chest, Abdomen, Front perineal area, Buttocks, Right upper leg, Left upper leg, Right lower leg         Bathing assist Assist Level: Minimal Assistance - Patient > 75%     Upper Body Dressing/Undressing Upper body dressing   What is the patient wearing?: Pull over shirt    Upper body assist Assist Level: Supervision/Verbal cueing    Lower Body Dressing/Undressing Lower body dressing            Lower body assist Assist for lower body dressing: Moderate Assistance - Patient 50 - 74%     Toileting Toileting    Toileting assist Assist for toileting: Contact Guard/Touching assist     Transfers Chair/bed transfer  Transfers assist  Chair/bed transfer activity did not occur: Refused (patient did not get up)  Chair/bed transfer assist level: Minimal Assistance - Patient > 75%     Locomotion Ambulation   Ambulation assist      Assist level: Minimal Assistance - Patient > 75% Assistive device: No Device Max distance: 180   Walk 10 feet activity   Assist     Assist level: Minimal Assistance - Patient > 75% Assistive device: No  Device   Walk 50 feet activity   Assist    Assist level: Minimal Assistance - Patient > 75% Assistive device: No Device    Walk 150 feet activity   Assist    Assist level: Minimal Assistance - Patient > 75% Assistive device: No Device    Walk 10 feet on uneven surface  activity   Assist Walk 10 feet on uneven surfaces activity did not occur: Safety/medical concerns (fatigue)         Wheelchair     Assist Is the patient using a wheelchair?: Yes Type of Wheelchair: Manual    Wheelchair assist level: Supervision/Verbal cueing Max wheelchair distance: 150    Wheelchair 50 feet with 2 turns activity    Assist        Assist Level: Supervision/Verbal cueing   Wheelchair 150 feet activity     Assist      Assist Level: Supervision/Verbal cueing   Blood pressure 113/80, pulse 86, temperature 98.1 F (36.7 C), resp. rate 18, height 5' 7 (1.702 m), weight 83.5 kg, SpO2 95%.  Medical Problem List and Plan: 1. 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 Onita contacted us  and I put in order for LP to be performed Monday thereabouts. Spoke to pt about this today 12/28.       -12/31- pt says feels stronger today, but concerned might have MS  We discussed at length how he's doing well- breathing tests great- and strength exam actually better  -LP just shows AIDP per Dr Georgianne note and also d/w Neuro on call   1/1- pt doing great on NIFs and VC- explained that unless those go down or a dramatic change in strength- won't do additional treatment- pt appears to want more treatment to speed up recovery, which I understand, however it doesn't usually do so in the literature.   Con't CIR PT and OT  Ordered AFO consult for B/L AFO's 2.   Antithrombotics: -DVT/anticoagulation:  Pharmaceutical: Lovenox              -antiplatelet therapy: N/A   3.Chronic LBP/Pain Management: Continue oxycodone  prn-->managed by PCP             --on Gabapentin  100 mg BID for neuropathy -> Per discussion with patient on admission, ongoing hypersensitivity on bottom of feet, discussed gabapentin  adjustment vs. Lyrica, opted to switch to gabapentin  300 mg nightly.   12/24- will change Gabapentin  to 400 mg TID for now- can titrate up more if needed; will also add Robaxin  500 mg q6 hours prn for muscle spasms 12/28- adjusted gabapentin  to 600mg  tid --it's helping  -adjusted robaxin  to 1000mg  q6 prn  -ordered kpad  -discussed pm and am stretching of calves and hamstrings  12/29--kpad helping   -will increase gabapentin  to 600mg  qid   -reassured pt re: presentation, anxiety is an issue here  12/30- pt having 7-8/10 back pain, but is normal for him- due to OA in his low back- educated pt on how to treat muscle spasms in calves  12/31- changed Robaxin  to Zanaflex  4 mg BID- first dose at 630 am  1/1- Pt reports muscle spasms slightly better but still twinging this AM  1/2- added Duloxetine  30 mg at bedtime and will increase in 3 days if tolerated  -also added gabapentin  600 mg qday prn for times when nerve pain worse (feeling cold hands an  1/3- explained will take til Monday/Tuesday til any improvement in nerve pain from Duloxetine - con't gabapentin  and prn gabapentin  as well--no complaints over this weekend   4. Mood/Behavior/Sleep: Stable on Zoloft  with low dose klonopin  prn. LCSW to follow for evaluation and support.              -antipsychotic agents: N/A             --has been using Klonopin  for sleep   5. Neuropsych/cognition: This patient is capable of making decisions on his own behalf. 6. Skin/Wound Care: Routine pressure relief measures.  7. Fluids/Electrolytes/Nutrition: Monitor I/O. Check CMET in am 8. HTN: Monitor BP TID--continue   metoprolol  and irbesartan   1/1-1/4 BP controlled- con't regimen 9. Hyponatremia: resolved     Latest Ref Rng & Units 10/15/2023    6:10 AM 10/12/2023    5:52 AM 10/06/2023    4:56 AM  BMP  Glucose 70 - 99 mg/dL 97  86  93   BUN 6 - 20 mg/dL 9  10  9    Creatinine 0.61 - 1.24 mg/dL 9.22  9.20  9.31   Sodium 135 - 145 mmol/L 138  137  142   Potassium 3.5 - 5.1 mmol/L 4.2  4.5  3.9   Chloride 98 - 111 mmol/L 102  102  108   CO2 22 - 32 mmol/L 24  25  24    Calcium  8.9 - 10.3 mg/dL 9.5  9.4  9.1     10. H/o anxiety disorder: Off Buspar  as was ineffective. Continue Klonopin  bid prn  1/3- takes Klonopin  1-2x/day 11. H/o Depression: Managed with low dose Zoloft .  12. Hypothyroid: Continue synthroid  125 mc daily for supplement. 1/2- pt askin gif can rescheck labs- explained that shouldn't check closer than 3 months- also explained that in acute setting- like being ill/having GBS, can make TSH labs haywire- needs to be checked out of hospital.   1/4: Per patient request, reviewed chart; dose of thyroid  med changed from 150 mg  to 125 mg 11/22; high variability between high and low TSH before then.  Has been approximately 1.5 months - no s/s toxic thyroid , can recheck TSH/free T4 in AM but if not significantly uptrending agree with waiting for 3 months for further adjustment 1-5: TSH, free T4 appropriate/improving.  Advised patient to continue current dose of medication.  He is happy with this result.    13. Cough/wheezing  12/24- no elevated WBC; Afebrile- will treat Sx's- with Mucinex  600 mg BID, Tessalon  pearls scheduled 100 mg TID for 5 days and Albuterol  inhaler, per pt request, not nebs-   12/29--sx have largely resolved  1/1- pt reports still has cough- asking for inhaler- which I agree with- had pneumonia 2 months ago- and said cough hasn't resolved so far.   1/2- doing a little better with albuterol  inhaler- doesn't like nebs- tried in past- will restart Tessalon  pearls 100 mg TID for 5  days.   1/3- denies as much cough this AM - stable, no complaints over this weekend    LOS: 13 days A FACE TO FACE EVALUATION WAS PERFORMED  Jonathan Luna Likes 10/18/2023, 9:47 PM

## 2023-10-18 NOTE — Progress Notes (Signed)
 Occupational Therapy Session Note  Patient Details  Name: MAKYLE ESLICK MRN: [id number] Date of Birth: [birth year]  Today's Date: 10/19/2023 OT Individual Time: 1002-1033 OT Individual Time Calculation (min): 31 min    Short Term Goals: Week 2:  OT Short Term Goal 1 (Week 2): STG=LTG d/t ELOS  Skilled Therapeutic Interventions/Progress Updates:  Pt greeted sitting in [facility] for skilled OT session with focus on discharge planning.   Pain: Pt reported 7/10 pain in B calves, pre-medicated. OT offering intermediate rest breaks and positioning suggestions throughout session to address pain/fatigue and maximize participation/safety in session.   Functional Transfers: Functional mobility at household-level distance with distant supervision + rollator.   Self Care Tasks/Education: Pt and OT review the 4P's of energy conservation as patient prepares for upcoming discharge. Handout provided with written education and tips for carryover, pt receptive. Handouts for food items beneficial to hypertension and pain retrieved from binders placed in hallway. Pt appreciative of access.   Pt remained sitting in WC with 4Ps assessed and immediate needs met. Pt continues to be appropriate for skilled OT intervention to promote further functional independence in ADLs/IADLs.   Therapy Documentation Precautions:  Precautions Precautions: Fall Restrictions Weight Bearing Restrictions Per Provider Order: No  Therapy/Group: Individual Therapy  Nereida Habermann, OTR/L, MSOT  10/19/2023, 11:19 AM

## 2023-10-18 NOTE — Progress Notes (Signed)
 Occupational Therapy Session Note  Patient Details  Name: BIRD SWETZ MRN: [id number] Date of Birth: 12/09/1965  Today's Date: 10/18/2023 OT Individual Time: 8984-8884 OT Individual Time Calculation (min): 60 min    Short Term Goals: Week 2:  OT Short Term Goal 1 (Week 2): STG=LTG d/t ELOS  Skilled Therapeutic Interventions/Progress Updates:      Therapy Documentation Precautions:  Precautions Precautions: Fall Restrictions Weight Bearing Restrictions Per Provider Order: No General: "I will see you tomorrow morning!" Pt seated in W/C upon OT arrival, agreeable to OT.  Pain: no pain reported  ADL: Pt ambulating with rollator from room throughout therapy unit from ortho gym to day room with no LOB/SOB. Pt wanting to practice tub/shower transfer, can complete at distant supervision with RW. Pt educated on how to manage shower curtain to prevent water from overflowing. Pt also educated on removable shower head holders that are closer to where pt is seated on TTB versus near shower head.   Exercises: Pt had e-stim unit on Lt wrist extensors in order to increase strength/ROM for increased functional use. Pt throwing/catching ball with OT in seated position, throwing when e-stim unit active in order to promote wrist extension. Pt thoroughly enjoying activity. OT left e-stim unit on for rest of cycle unattended.  Other Treatments: Pt reporting having trouble with opening containers d/t decreased wrist function. OT issued dycem for increased grip for tight containers such as water bottles.   Pt seated in W/C at end of session with W/C alarm donned, call light within reach and 4Ps assessed.    Therapy/Group: Individual Therapy  Camie Hoe, OTD, OTR/L 10/18/2023, 3:58 PM

## 2023-10-19 DIAGNOSIS — G610 Guillain-Barre syndrome: Secondary | ICD-10-CM | POA: Diagnosis not present

## 2023-10-19 LAB — BASIC METABOLIC PANEL
Anion gap: 11 (ref 5–15)
BUN: 11 mg/dL (ref 6–20)
CO2: 23 mmol/L (ref 22–32)
Calcium: 9.5 mg/dL (ref 8.9–10.3)
Chloride: 104 mmol/L (ref 98–111)
Creatinine, Ser: 0.77 mg/dL (ref 0.61–1.24)
GFR, Estimated: >60 mL/min (ref >60)
Glucose, Bld: 100 mg/dL — ABNORMAL HIGH (ref 70–99)
Potassium: 4.2 mmol/L (ref 3.5–5.1)
Sodium: 138 mmol/L (ref 135–145)

## 2023-10-19 LAB — CBC
HCT: 43.1 % (ref 39.0–52.0)
Hemoglobin: 14.7 g/dL (ref 13.0–17.0)
MCH: 31.1 pg (ref 26.0–34.0)
MCHC: 34.1 g/dL (ref 30.0–36.0)
MCV: 91.1 fL (ref 80.0–100.0)
Platelets: 422 10*3/uL — ABNORMAL HIGH (ref 150–400)
RBC: 4.73 MIL/uL (ref 4.22–5.81)
RDW: 13.2 % (ref 11.5–15.5)
WBC: 6.9 10*3/uL (ref 4.0–10.5)
nRBC: 0 % (ref 0.0–0.2)

## 2023-10-19 MED ORDER — IRBESARTAN 300 MG PO TABS
300.0000 mg | ORAL_TABLET | Freq: Every day | ORAL | 0 refills | Status: DC
  Filled 2023-10-19: qty 30, 30d supply, fill #0

## 2023-10-19 MED ORDER — DULOXETINE HCL 60 MG PO CPEP
60.0000 mg | ORAL_CAPSULE | Freq: Every day | ORAL | 0 refills | Status: DC
  Filled 2023-10-19: qty 30, 30d supply, fill #0

## 2023-10-19 MED ORDER — TIZANIDINE HCL 4 MG PO TABS
4.0000 mg | ORAL_TABLET | Freq: Two times a day (BID) | ORAL | 0 refills | Status: DC
  Filled 2023-10-19: qty 60, 30d supply, fill #0

## 2023-10-19 MED ORDER — METOPROLOL SUCCINATE ER 25 MG PO TB24
25.0000 mg | ORAL_TABLET | Freq: Every day | ORAL | 0 refills | Status: DC
  Filled 2023-10-19: qty 30, 30d supply, fill #0

## 2023-10-19 MED ORDER — SERTRALINE HCL 25 MG PO TABS
25.0000 mg | ORAL_TABLET | Freq: Every day | ORAL | 0 refills | Status: DC
  Filled 2023-10-19: qty 30, 30d supply, fill #0

## 2023-10-19 MED ORDER — CLONAZEPAM 0.5 MG PO TABS
0.5000 mg | ORAL_TABLET | Freq: Two times a day (BID) | ORAL | 0 refills | Status: DC | PRN
  Filled 2023-10-19: qty 15, 8d supply, fill #0

## 2023-10-19 MED ORDER — GUAIFENESIN ER 600 MG PO TB12
600.0000 mg | ORAL_TABLET | Freq: Two times a day (BID) | ORAL | 0 refills | Status: DC
  Filled 2023-10-19: qty 28, 14d supply, fill #0

## 2023-10-19 MED ORDER — GABAPENTIN 300 MG PO CAPS
600.0000 mg | ORAL_CAPSULE | Freq: Four times a day (QID) | ORAL | 0 refills | Status: DC
  Filled 2023-10-19: qty 204, 26d supply, fill #0
  Filled 2023-10-20: qty 36, 4d supply, fill #0

## 2023-10-19 MED ORDER — BENZONATATE 100 MG PO CAPS
100.0000 mg | ORAL_CAPSULE | Freq: Three times a day (TID) | ORAL | 0 refills | Status: DC
  Filled 2023-10-19: qty 20, 7d supply, fill #0

## 2023-10-19 NOTE — Plan of Care (Signed)
  Problem: RH Balance Goal: LTG Patient will maintain dynamic standing with ADLs (OT) Description: LTG:  Patient will maintain dynamic standing balance with assist during activities of daily living (OT)  Outcome: Completed/Met Flowsheets (Taken 10/06/2023 1255) LTG: Pt will maintain dynamic standing balance during ADLs with: Independent with assistive device   Problem: RH Grooming Goal: LTG Patient will perform grooming w/assist,cues/equip (OT) Description: LTG: Patient will perform grooming with assist, with/without cues using equipment (OT) Outcome: Completed/Met Flowsheets (Taken 10/06/2023 1255) LTG: Pt will perform grooming with assistance level of: Independent with assistive device    Problem: RH Bathing Goal: LTG Patient will bathe all body parts with assist levels (OT) Description: LTG: Patient will bathe all body parts with assist levels (OT) Outcome: Completed/Met Flowsheets (Taken 10/06/2023 1255) LTG: Pt will perform bathing with assistance level/cueing: Independent with assistive device    Problem: RH Dressing Goal: LTG Patient will perform upper body dressing (OT) Description: LTG Patient will perform upper body dressing with assist, with/without cues (OT). Outcome: Completed/Met Flowsheets (Taken 10/06/2023 1255) LTG: Pt will perform upper body dressing with assistance level of: Independent with assistive device Goal: LTG Patient will perform lower body dressing w/assist (OT) Description: LTG: Patient will perform lower body dressing with assist, with/without cues in positioning using equipment (OT) Outcome: Completed/Met Flowsheets (Taken 10/06/2023 1255) LTG: Pt will perform lower body dressing with assistance level of: Independent with assistive device   Problem: RH Toileting Goal: LTG Patient will perform toileting task (3/3 steps) with assistance level (OT) Description: LTG: Patient will perform toileting task (3/3 steps) with assistance level (OT)  Outcome:  Completed/Met Flowsheets (Taken 10/06/2023 1255) LTG: Pt will perform toileting task (3/3 steps) with assistance level: Independent with assistive device   Problem: RH Toilet Transfers Goal: LTG Patient will perform toilet transfers w/assist (OT) Description: LTG: Patient will perform toilet transfers with assist, with/without cues using equipment (OT) Outcome: Completed/Met Flowsheets (Taken 10/06/2023 1255) LTG: Pt will perform toilet transfers with assistance level of: Independent with assistive device   Problem: RH Tub/Shower Transfers Goal: LTG Patient will perform tub/shower transfers w/assist (OT) Description: LTG: Patient will perform tub/shower transfers with assist, with/without cues using equipment (OT) Outcome: Completed/Met Flowsheets (Taken 10/06/2023 1255) LTG: Pt will perform tub/shower stall transfers with assistance level of: Independent with assistive device

## 2023-10-19 NOTE — Progress Notes (Signed)
 PROGRESS NOTE   Subjective/Complaints:  Pt reports having yellow mucus when coughs in AM- concerned might get pneumonia or have it already.   Pain described in L teres- scared because had Sx's when  started with GBS Sx's.  Comes and goes.   2 BM's yesterday  ROS:    Pt denies SOB, abd pain, CP, N/V/C/D, and vision changes   Negative except for HPI Objective:   No results found.  Recent Labs    10/19/23 0554  WBC 6.9  HGB 14.7  HCT 43.1  PLT 422*     Recent Labs    10/19/23 0554  NA 138  K 4.2  CL 104  CO2 23  GLUCOSE 100*  BUN 11  CREATININE 0.77  CALCIUM  9.5      Intake/Output Summary (Last 24 hours) at 10/19/2023 0832 Last data filed at 10/19/2023 0742 Gross per 24 hour  Intake 700 ml  Output --  Net 700 ml        Physical Exam: Vital Signs Blood pressure (!) 157/98, pulse 100, temperature 98.1 F (36.7 C), resp. rate 18, height 5' 7 (1.702 m), weight 83.5 kg, SpO2 100%.     General: awake, alert, appropriate, sitting up in w/c at bedside; NAD HENT: conjugate gaze; oropharynx moist CV: regular to borderline tachycardic rate regular rhythm; no JVD Pulmonary: CTA B/L; no W/R/R- good air movement- sounds great GI: soft, NT, ND, (+)BS- normoactive Psychiatric: appropriate but still a little anxious Neurological: Ox3 MSK- TTP over L teres Skin: No evidence of breakdown, no evidence of rash MSK: No apparent deformities. Psychiatric: Appropriate mood and affect. Neurological: Ox3.  Sensation grossly intact.  Cranial nerves grossly intact.      Strength:                RUE: 5/5 SA, 5/5 EF, 5/5 EE, 4-/5 WE, 4-/5 FF, 4-/5 FA                LUE:  5/5 SA, 5/5 EF, 5/5 EE, 4-/5 WE, 4-/5 FF, 4-/5 FA                RLE: 5/5 HF, 5/5 KE, 5/5  DF,, 3+/5  PF                 LLE:  5/5 HF, 5/5 KE, 5/5  DF, 5/5  EHL, 2+/5  PF     Prior: No change in strength in Ue's except wrists appears weaker- ~  3-/5 B/L- BUT OTHERWISE THE SAME  Exam done 12/31- Biceps/triceps 5/5; WE on R 4-/5; L 3+/5; Grip 4/5 on R and 4-/5 on L; FA 2+/5 LE's HF/KE 5-/5; DF 4/5 and PF 4+/5 - is better today NMSK- calves tight still- posteriorly- but not tone 12/30 RUE- biceps/triceps 5/5; WE 4-/5; Grip 4/5; FA 2+/5 LUE- biceps/triceps 5/5; WE 3+/5; Grip 4-/5 and FA 2+/5 LE's- HF/KE 5-/5; DF 4-/5; and PF 4/5 MSK- tight calves- B/L Ext: no clubbing, cyanosis, or edema Psych: pleasant and cooperative        Assessment/Plan: 1. Functional deficits which require 3+ hours per day of interdisciplinary therapy in a comprehensive inpatient rehab setting. Physiatrist is providing  close team supervision and 24 hour management of active medical problems listed below. Physiatrist and rehab team continue to assess barriers to discharge/monitor patient progress toward functional and medical goals  Care Tool:  Bathing    Body parts bathed by patient: Right arm, Left lower leg, Face, Left arm, Chest, Abdomen, Front perineal area, Buttocks, Right upper leg, Left upper leg, Right lower leg         Bathing assist Assist Level: Minimal Assistance - Patient > 75%     Upper Body Dressing/Undressing Upper body dressing   What is the patient wearing?: Pull over shirt    Upper body assist Assist Level: Supervision/Verbal cueing    Lower Body Dressing/Undressing Lower body dressing            Lower body assist Assist for lower body dressing: Moderate Assistance - Patient 50 - 74%     Toileting Toileting    Toileting assist Assist for toileting: Contact Guard/Touching assist     Transfers Chair/bed transfer  Transfers assist  Chair/bed transfer activity did not occur: Refused (patient did not get up)  Chair/bed transfer assist level: Minimal Assistance - Patient > 75%     Locomotion Ambulation   Ambulation assist      Assist level: Minimal Assistance - Patient > 75% Assistive device: No  Device Max distance: 180   Walk 10 feet activity   Assist     Assist level: Minimal Assistance - Patient > 75% Assistive device: No Device   Walk 50 feet activity   Assist    Assist level: Minimal Assistance - Patient > 75% Assistive device: No Device    Walk 150 feet activity   Assist    Assist level: Minimal Assistance - Patient > 75% Assistive device: No Device    Walk 10 feet on uneven surface  activity   Assist Walk 10 feet on uneven surfaces activity did not occur: Safety/medical concerns (fatigue)         Wheelchair     Assist Is the patient using a wheelchair?: Yes Type of Wheelchair: Manual    Wheelchair assist level: Supervision/Verbal cueing Max wheelchair distance: 150    Wheelchair 50 feet with 2 turns activity    Assist        Assist Level: Supervision/Verbal cueing   Wheelchair 150 feet activity     Assist      Assist Level: Supervision/Verbal cueing   Blood pressure (!) 157/98, pulse 100, temperature 98.1 F (36.7 C), resp. rate 18, height 5' 7 (1.702 m), weight 83.5 kg, SpO2 100%.  Medical Problem List and Plan: 1. 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 Onita contacted us  and I put in order for LP to be performed Monday thereabouts. Spoke to pt about this today 12/28.       -12/31- pt says feels stronger today, but concerned might have MS  We discussed at length how he's doing well- breathing tests great- and strength exam actually better  -LP just shows AIDP per Dr Georgianne note and also d/w Neuro on call   1/6- will  stop NIF's VCs since doing so great  Con't CIR PT and OT- d/c tomorrow  Ordered AFO consult for B/L AFO's 2.  Antithrombotics: -DVT/anticoagulation:  Pharmaceutical:  Lovenox              -antiplatelet therapy: N/A   3.Chronic LBP/Pain Management: Continue oxycodone  prn-->managed by PCP             --on Gabapentin  100 mg BID for neuropathy -> Per discussion with patient on admission, ongoing hypersensitivity on bottom of feet, discussed gabapentin  adjustment vs. Lyrica, opted to switch to gabapentin  300 mg nightly.   12/24- will change Gabapentin  to 400 mg TID for now- can titrate up more if needed; will also add Robaxin  500 mg q6 hours prn for muscle spasms 12/28- adjusted gabapentin  to 600mg  tid --it's helping  -adjusted robaxin  to 1000mg  q6 prn  -ordered kpad  -discussed pm and am stretching of calves and hamstrings  12/29--kpad helping   -will increase gabapentin  to 600mg  qid   -reassured pt re: presentation, anxiety is an issue here  12/30- pt having 7-8/10 back pain, but is normal for him- due to OA in his low back- educated pt on how to treat muscle spasms in calves  12/31- changed Robaxin  to Zanaflex  4 mg BID- first dose at 630 am  1/1- Pt reports muscle spasms slightly better but still twinging this AM  1/2- added Duloxetine  30 mg at bedtime and will increase in 3 days if tolerated  -also added gabapentin  600 mg qday prn for times when nerve pain worse (feeling cold hands an  1/3- explained will take til Monday/Tuesday til any improvement in nerve pain from Duloxetine - con't gabapentin  and prn gabapentin  as well--no complaints over this weekend  1/6- less nerve pain  4. Mood/Behavior/Sleep: Stable on Zoloft  with low dose klonopin  prn. LCSW to follow for evaluation and support.              -antipsychotic agents: N/A             --has been using Klonopin  for sleep   5. Neuropsych/cognition: This patient is capable of making decisions on his own behalf. 6. Skin/Wound Care: Routine pressure relief measures.  7. Fluids/Electrolytes/Nutrition: Monitor I/O. Check CMET in am 8. HTN: Monitor BP TID--continue  metoprolol  and irbesartan   1/1-1/6 BP  controlled- con't regimen 9. Hyponatremia: resolved     Latest Ref Rng & Units 10/19/2023    5:54 AM 10/15/2023    6:10 AM 10/12/2023    5:52 AM  BMP  Glucose 70 - 99 mg/dL 899  97  86   BUN 6 - 20 mg/dL 11  9  10    Creatinine 0.61 - 1.24 mg/dL 9.22  9.22  9.20   Sodium 135 - 145 mmol/L 138  138  137   Potassium 3.5 - 5.1 mmol/L 4.2  4.2  4.5   Chloride 98 - 111 mmol/L 104  102  102   CO2 22 - 32 mmol/L 23  24  25    Calcium  8.9 - 10.3 mg/dL 9.5  9.5  9.4     10. H/o anxiety disorder: Off Buspar  as was ineffective. Continue Klonopin  bid prn  1/3- takes Klonopin  1-2x/day 11. H/o Depression: Managed with low dose Zoloft .  12. Hypothyroid: Continue synthroid  125 mc daily for supplement. 1/2- pt askin gif can rescheck labs- explained that shouldn't check closer than 3 months- also explained that in acute setting- like being ill/having GBS, can make TSH labs haywire- needs to be  checked out of hospital.   1/4: Per patient request, reviewed chart; dose of thyroid  med changed from 150 mg to 125 mg 11/22; high variability between high and low TSH before then.  Has been approximately 1.5 months - no s/s toxic thyroid , can recheck TSH/free T4 in AM but if not significantly uptrending agree with waiting for 3 months for further adjustment 1-5: TSH, free T4 appropriate/improving.  Advised patient to continue current dose of medication.  He is happy with this result.    13. Cough/wheezing  12/24- no elevated WBC; Afebrile- will treat Sx's- with Mucinex  600 mg BID, Tessalon  pearls scheduled 100 mg TID for 5 days and Albuterol  inhaler, per pt request, not nebs-   12/29--sx have largely resolved  1/1- pt reports still has cough- asking for inhaler- which I agree with- had pneumonia 2 months ago- and said cough hasn't resolved so far.   1/2- doing a little better with albuterol  inhaler- doesn't like nebs- tried in past- will restart Tessalon  pearls 100 mg TID for 5 days.   1/3- denies as much cough this AM  - stable, no complaints over this weekend  1/6- reports having yellow mucus- lungs completely clear- pt worred might get pneumonia- reassured him no signs of this.    I spent a total of 36   minutes on total care today- >50% coordination of care- due to  D/w pt about concerns of L teres pain- which appears to be completely muscular as well as yellow mucus- assuaged concerns  LOS: 14 days A FACE TO FACE EVALUATION WAS PERFORMED  Haislee Corso 10/19/2023, 8:32 AM

## 2023-10-19 NOTE — Progress Notes (Signed)
 Occupational Therapy Discharge Summary  Patient Details  Name: Jonathan Luna MRN: [id number] Date of Birth: 24-Jan-1966  Date of Discharge from OT service:October 19, 2023  Today's Date: 10/19/2023 OT Individual Time: 0800-0900 OT Individual Time Calculation (min): 60 min    Patient has met 8 of 8 long term goals due to improved activity tolerance, improved balance, postural control, ability to compensate for deficits, functional use of  RIGHT upper, RIGHT lower, LEFT upper, and LEFT lower extremity, improved awareness, and improved coordination.  Patient to discharge at overall Modified Independent level.  Patient's care partner is independent to provide the necessary physical assistance at discharge.    Reasons goals not met: All goals met  Recommendation:  Patient will benefit from ongoing skilled OT services in outpatient setting to continue to advance functional skills in the area of BADL, iADL, and Reduce care partner burden.  Equipment: TTB  Reasons for discharge: treatment goals met and discharge from hospital  Patient/family agrees with progress made and goals achieved: Yes  OT Discharge Precautions/Restrictions  Precautions Precautions: Fall Restrictions Weight Bearing Restrictions Per Provider Order: No Pain Pain Assessment Pain Scale: 0-10 Pain Score: 8  Pain Type: Chronic pain Pain Location: Back Pain Orientation: Lower Pain Descriptors / Indicators: Aching Pain Onset: On-going Pain Intervention(s): Medication (See eMAR);Emotional support;Rest Multiple Pain Sites: No ADL ADL Eating: Modified independent Where Assessed-Eating: Chair Grooming: Modified independent Where Assessed-Grooming: Standing at sink Upper Body Bathing: Modified independent Where Assessed-Upper Body Bathing: Shower Lower Body Bathing: Modified independent Where Assessed-Lower Body Bathing: Shower Upper Body Dressing: Modified independent (Device) Where Assessed-Upper Body Dressing: Edge  of bed Lower Body Dressing: Modified independent Where Assessed-Lower Body Dressing: Standing at sink, Sitting at sink Toileting: Modified independent Where Assessed-Toileting: Teacher, Adult Education: Modified Community Education Officer Method: Insurance Claims Handler: Modified independent Web Designer Method: Ship Broker: Walk in shower, Grab bars, Insurance Underwriter: Modified independent Film/video Editor Method: Designer, Industrial/product: Grab bars, Transfer tub bench ADL Comments: Pt reported fatigue after ADL session in PM session. Pt requiring intermittent rest breaks during ADL d/t decreased endurance/fatigue. Pt standing on one leg completing peri care in shower requiring Min A for stability. Pt holding onto grab bar intermittently for support in standing with CGA. Pt using mouth to manage toothpaste cap d/t decreased sensation and ataxia in BUE. Vision Baseline Vision/History: 1 Wears glasses Patient Visual Report: No change from baseline Vision Assessment?: Wears glasses for driving;Wears glasses for reading Perception  Perception: Within Functional Limits Praxis Praxis: WFL Cognition Cognition Overall Cognitive Status: Within Functional Limits for tasks assessed Arousal/Alertness: Awake/alert Orientation Level: Person;Place;Situation Person: Oriented Place: Oriented Situation: Oriented Memory: Appears intact Awareness: Appears intact Problem Solving: Appears intact Safety/Judgment: Appears intact Brief Interview for Mental Status (BIMS) Repetition of Three Words (First Attempt): 3 Temporal Orientation: Year: Correct Temporal Orientation: Month: Accurate within 5 days Temporal Orientation: Day: Correct Recall: Sock: Yes, no cue required Recall: Blue: Yes, no cue required Recall: Bed: Yes, no cue required BIMS Summary Score: 15 Sensation Sensation Light Touch: Appears  Intact Hot/Cold: Appears Intact Proprioception: Appears Intact Stereognosis: Not tested Coordination Gross Motor Movements are Fluid and Coordinated: Yes Fine Motor Movements are Fluid and Coordinated: Yes Coordination and Movement Description: ataxia greatly improved since eval Motor  Motor Motor: Ataxia Motor - Discharge Observations: ataxia, although greatly improved since eval Mobility  Bed Mobility Bed Mobility: Rolling Right;Rolling Left;Supine to Sit;Sit to Supine Rolling Right: Independent Rolling Left: Independent Supine to  Sit: Independent Sit to Supine: Independent Transfers Sit to Stand: Independent with assistive device Stand to Sit: Independent with assistive device  Trunk/Postural Assessment  Cervical Assessment Cervical Assessment: Within Functional Limits Thoracic Assessment Thoracic Assessment: Within Functional Limits Lumbar Assessment Lumbar Assessment: Within Functional Limits Postural Control Postural Control: Within Functional Limits  Balance Balance Balance Assessed: Yes Static Sitting Balance Static Sitting - Balance Support: Feet supported Static Sitting - Level of Assistance: 6: Modified independent (Device/Increase time) Dynamic Sitting Balance Dynamic Sitting - Balance Support: Feet supported;During functional activity Dynamic Sitting - Level of Assistance: 6: Modified independent (Device/Increase time) Dynamic Sitting - Balance Activities: Reaching for objects Static Standing Balance Static Standing - Balance Support: Bilateral upper extremity supported;During functional activity Static Standing - Level of Assistance: 6: Modified independent (Device/Increase time) Dynamic Standing Balance Dynamic Standing - Balance Support: During functional activity;Bilateral upper extremity supported Dynamic Standing - Level of Assistance: 6: Modified independent (Device/Increase time) Dynamic Standing - Balance Activities: Reaching for objects;Forward  lean/weight shifting;Reaching across midline Extremity/Trunk Assessment RUE Assessment RUE Assessment: Exceptions to [facility] Active Range of Motion (AROM) Comments: WFL all joints although weak wrist extension General Strength Comments: overall 4+/5, wrist ext 3+/5 LUE Assessment LUE Assessment: Exceptions to [facility] Active Range of Motion (AROM) Comments: WFL all joints although weak wrist extension General Strength Comments: overall 4+/5, wrist ext 3+/5  Tx session General: "Thank you!" Pt seated in W/C upon OT arrival, agreeable to OT.  Pain: see above  ADL: Pt ambulating throughout therapy unit with rollator at Mod I with increased heel-to-toe technique with pt verbally cueing self throughout ambulation.   Exercises: Pt seated in chair with saebo e-stim applied on Lt wrist extensors, instructed to pick up ball as e-stim turned on and bounce it when turned off for increased wrist function, strength, and stability. Pt completed multiple trials of task with ~70% accuracy with increased noted coordination and strength. OT left e-stim unit on after session to finish cycle for increased NMRE.  Other Treatments: OT re-tested grip pinch below: Gross grasp: R=35.5 lb, L=32.6 lb  Lateral Pinch: R=9.6 lb, L= 8.8 lb  Pt demonstrating increased grip/pinch strength and FMC from last tested on 12/31.   Pt asking about exercise regimen for home and how to split workouts. OT recommended completing full body functional exercises in order to target all large muscle groups for increased independence in ADLs and IADLs.   Pt seated in W/C at end of session with W/C alarm donned, call light within reach and 4Ps assessed.    Camie Hoe, OTD, OTR/L 10/19/2023, 12:33 PM

## 2023-10-19 NOTE — Progress Notes (Signed)
 Physical Therapy Discharge Summary  Patient Details  Name: Jonathan Luna MRN: [id number] Date of Birth: 1966/03/12  Date of Discharge from PT service:October 18, 2022  Today's Date: 10/19/2023 PT Individual Time: 1300-1415 PT Individual Time Calculation (min): 75 min    Patient has met 8 of 8 long term goals due to improved activity tolerance, improved balance, improved postural control, increased range of motion, ability to compensate for deficits, and improved coordination.  Patient to discharge at an ambulatory level Modified Independent.     Reasons goals not met: N/A   Recommendation:  Patient will benefit from ongoing skilled PT services in outpatient setting to continue to advance safe functional mobility, address ongoing impairments in balance, coordination, and minimize fall risk.  Equipment: Rollator, bilateral AFO's   Reasons for discharge: lack of progress toward goals, treatment goals met, and discharge from hospital  Patient/family agrees with progress made and goals achieved: Yes  PT Discharge Pain Interference Pain Interference Pain Effect on Sleep: 1. Rarely or not at all Pain Interference with Therapy Activities: 2. Occasionally Pain Interference with Day-to-Day Activities: 2. Occasionally Cognition Overall Cognitive Status: Within Functional Limits for tasks assessed Arousal/Alertness: Awake/alert Orientation Level: Oriented X4 Memory: Appears intact Awareness: Appears intact Problem Solving: Appears intact Safety/Judgment: Appears intact Sensation Sensation Light Touch: Appears Intact Proprioception: Appears Intact Coordination Gross Motor Movements are Fluid and Coordinated: Yes Fine Motor Movements are Fluid and Coordinated: Yes Coordination and Movement Description: ataxia greatly improved since eval Heel Shin Test: [facility] Motor  Motor Motor: Within Functional Limits Motor - Skilled Clinical Observations: ataxia, improved since eval Motor - Discharge  Observations: ataxia, although greatly improved since eval  Mobility Bed Mobility Bed Mobility: Rolling Right;Rolling Left;Supine to Sit;Sit to Supine Rolling Right: Independent Rolling Left: Independent Supine to Sit: Independent Sit to Supine: Independent Transfers Transfers: Sit to Stand;Stand to Sit;Stand Pivot Transfers;Transfer Sit to Stand: Independent with assistive device Stand to Sit: Independent with assistive device Stand Pivot Transfers: Independent with assistive device Stand Pivot Transfer Details: Visual cues for safe use of DME/AE Transfer (Assistive device): Rollator Locomotion  Gait Ambulation: Yes Gait Assistance: Independent with assistive device Gait Distance (Feet): 300 Feet Assistive device: Rollator Gait Gait: Yes Gait Pattern: Impaired Gait Pattern: Ataxic;Lateral hip instability Gait velocity: WFL Stairs / Additional Locomotion Stairs: Yes Stairs Assistance: Independent with assistive device Stair Management Technique: Two rails Number of Stairs: 12 Height of Stairs: 6 Ramp: Supervision/Verbal cueing Curb: Independent with assistive device Pick up small object from the floor assist level: Minimal Assistance - Patient > 75% Wheelchair Mobility Wheelchair Mobility: No  Trunk/Postural Assessment  Cervical Assessment Cervical Assessment: Within Functional Limits Thoracic Assessment Thoracic Assessment: Within Functional Limits Lumbar Assessment Lumbar Assessment: Within Functional Limits Postural Control Postural Control: Within Functional Limits  Balance Balance Balance Assessed: Yes Static Sitting Balance Static Sitting - Balance Support: Feet supported Static Sitting - Level of Assistance: 6: Modified independent (Device/Increase time) Dynamic Sitting Balance Dynamic Sitting - Balance Support: Feet supported;During functional activity Dynamic Sitting - Level of Assistance: 6: Modified independent (Device/Increase time) Dynamic Sitting -  Balance Activities: Reaching for objects Static Standing Balance Static Standing - Balance Support: Bilateral upper extremity supported;During functional activity Static Standing - Level of Assistance: 6: Modified independent (Device/Increase time) Dynamic Standing Balance Dynamic Standing - Balance Support: During functional activity;Bilateral upper extremity supported Dynamic Standing - Level of Assistance: 6: Modified independent (Device/Increase time) Dynamic Standing - Balance Activities: Reaching for objects;Forward lean/weight shifting;Reaching across midline Extremity Assessment  RUE  Assessment RUE Assessment: Exceptions to [facility] Active Range of Motion (AROM) Comments: WFL all joints although weak wrist extension General Strength Comments: overall 4+/5, wrist ext 3+/5 LUE Assessment LUE Assessment: Exceptions to [facility] Active Range of Motion (AROM) Comments: WFL all joints although weak wrist extension General Strength Comments: overall 4+/5, wrist ext 3+/5 RLE Assessment RLE Assessment: Within Functional Limits General Strength Comments: grossly 4/5 except ankle DF 2/5 LLE Assessment LLE Assessment: Within Functional Limits General Strength Comments: grossly 4-/5 except ankle dorsiflexion 2/5   Pt agreeable to PT session with emphasis on discharge planning, AFO assessment with Schuyler from [facility] (orthotist), and functional mobility training. Pt mod I with all mobility including transfers, gait >200 ft, steps x 12 (6 inches) and 3 inch curb step with RW. Pt reports AFO's slipping and loose therefore orthotist notified and provided patient with various options given shoes. Pt encouraged to check skin before and after AFO wear and encouraged to only utilize for community mobility. Pt ambulated ~300 ft mod I to room and left seated at bedside with all needs in reach.    [name] Dominick Morella PT, DPT  10/19/2023, 3:53 PM

## 2023-10-19 NOTE — Progress Notes (Signed)
 Physical Therapy Session Note  Patient Details  Name: Jonathan Luna MRN: [id number] Date of Birth: 11/06/1965  Today's Date: 10/19/2023 PT Individual Time: 8954-8842 PT Individual Time Calculation (min): 72 min   Short Term Goals: Week 2:  PT Short Term Goal 1 (Week 2): STG's=LTG's due to ELOS  Skilled Therapeutic Interventions/Progress Updates:      Pt sitting in recliner to start - has no c/o pain. AFO's nor rollator not delivered to his room yet. Sit<>Stand to rollator mod I. Ambulated mod I with rollator from his room to main rehab gym, ~174ft.   Completed standing there-ex in // bars for BLE strengthening:  Exercises - Side Stepping with Resistance at Ankles  -  3 sets - 10 reps - Forward and Backward Monster Walk with Resistance at Ankles and Counter Support  -  3 sets - 10 reps - Standing Hip Extension with Resistance at Ankles and Counter Support  - 3 sets - 6 reps - Hip and Knee Flexion with Anchored Resistance  -  2 sets - 12 reps - Squatting Anti-Rotation Press  - 2 sets - 10 reps  Seated rest breaks provided as needed b/w sets. PT cueing as needed for posture, positioning, and sequencing. Used mirror for visual feedback to assist with awareness.   Pt ambulated back to his room mod I using the rollator. Adjusted to fit as his personal rollator has been delivered to his room. All needs met at end.   Therapy Documentation Precautions:  Precautions Precautions: Fall Restrictions Weight Bearing Restrictions Per Provider Order: No General:    Therapy/Group: Individual Therapy  Sherlean SHAUNNA Perks 10/19/2023, 7:46 AM

## 2023-10-19 NOTE — Progress Notes (Signed)
 Inpatient Rehabilitation Care Coordinator Discharge Note   Patient Details  Name: Jonathan Luna MRN: [id number] Date of Birth: 1966-05-02   Discharge location: HOME WITH WIFE WHO IS THERE INTERMITTENTLY HAS GONE BACK TO WORK  Length of Stay: 15 days  Discharge activity level: MOD/I LEVEL  Home/community participation: ACTIVE  Patient response un:Yzjouy Literacy - How often do you need to have someone help you when you read instructions, pamphlets, or other written material from your doctor or pharmacy?: Never  Patient response un:Dnrpjo Isolation - How often do you feel lonely or isolated from those around you?: Never  Services provided included: MD, RD, PT, OT, RN, CM, TR, Pharmacy, Neuropsych, SW  Financial Services:  Field Seismologist Utilized: [employer]  Choices offered to/list presented to: PT AND WIFE  Follow-up services arranged:  Outpatient, DME, Patient/Family has no preference for HH/DME agencies    Outpatient Servicies: [facility]- PT & OT WILL CALL TO SET UP FOLLOW UP APPOINTMENTS DME : [facility]    Patient response to transportation need: Is the patient able to respond to transportation needs?: Yes In the past 12 months, has lack of transportation kept you from medical appointments or from getting medications?: No In the past 12 months, has lack of transportation kept you from meetings, work, or from getting things needed for daily living?: No   Patient/Family verbalized understanding of follow-up arrangements:  Yes  Individual responsible for coordination of the follow-up plan: SELF AND [phone]  Confirmed correct DME delivered: Raymonde Asberry MATSU 10/19/2023    Comments (or additional information):PT DID WELL AND REACHED HIS MOD/I LEVEL GOALS. WIFE WAS HERE BUT IS BLIND AND CAN NOT ASSIST HIM. PT CAN DIRECT HIS CARE.   Summary of Stay    Date/Time Discharge Planning CSW  10/13/23 [phone] Home with wife and asssit from  brother. Will need recommendations for discharge. RGD  10/06/23 0816 TBA. per EMR, pt will d/c to home with wife and PRN support from brother. SW will confirm there are no barriers to discharge. AAC       Mialee Weyman G

## 2023-10-20 ENCOUNTER — Telehealth: Payer: Self-pay | Admitting: Physical Medicine and Rehabilitation

## 2023-10-20 ENCOUNTER — Other Ambulatory Visit (HOSPITAL_COMMUNITY): Payer: Self-pay

## 2023-10-20 DIAGNOSIS — G610 Guillain-Barre syndrome: Secondary | ICD-10-CM | POA: Diagnosis not present

## 2023-10-20 NOTE — Progress Notes (Signed)
 Inpatient Rehabilitation Discharge Medication Review by a Pharmacist  A complete drug regimen review was completed for this patient to identify any potential clinically significant medication issues.  High Risk Drug Classes Is patient taking? Indication by Medication  Antipsychotic No   Anticoagulant No   Antibiotic No   Opioid Yes Percocet for chronic pain   Antiplatelet Yes Aspirin  for CAD prevention  Hypoglycemics/insulin No   Vasoactive Medication Yes Irbesartan , metoprolol  succinate for HTN  Chemotherapy No   Other Yes Benzonatate , guaifenesin  for cough Clonazepam  for anxiety Duloxetine , sertraline  for depression Fluticasone  for rhinitis Gabapentin  for nerve pain  Tizanidine  for muscle spasm Unithroid  for hypothyroidism    HTN, HLD, anxiety/depression, arthritis, hypothyroidism, neuropathy impaired gait   Type of Medication Issue Identified Description of Issue Recommendation(s)  Drug Interaction(s) (clinically significant)     Duplicate Therapy     Allergy      No Medication Administration End Date     Incorrect Dose     Additional Drug Therapy Needed     Significant med changes from prior encounter (inform family/care partners about these prior to discharge).    Other       Clinically significant medication issues were identified that warrant physician communication and completion of prescribed/recommended actions by midnight of the next day:  No  Name of provider notified for urgent issues identified:   Provider Method of Notification:     Pharmacist comments:   Time spent performing this drug regimen review (minutes):  10  Jinnie Door, PharmD, BCPS, [facility] Clinical Pharmacist  Please check AMION for all [facility] phone numbers After 10:00 PM, call Main Pharmacy [phone]

## 2023-10-20 NOTE — Progress Notes (Signed)
 PROGRESS NOTE   Subjective/Complaints:  Pt concerned about pain in L teres still- palpated a trigger point, and explained it's muscular, not AIDP getting worse.   Also asking if I can be PCP- explained cannot- I'm a rehab physician.  Asking a=how many therapy sessions he will have after d/c- said it's dependent on insurance, but likely ~ 30 sessions.  Also advised pt NO CREATINE- it can cause kidney issues- just stick to whey protein.    ROS:    Pt denies SOB, abd pain, CP, N/V/C/D, and vision changes   Negative except for HPI Objective:   No results found.  Recent Labs    10/19/23 0554  WBC 6.9  HGB 14.7  HCT 43.1  PLT 422*     Recent Labs    10/19/23 0554  NA 138  K 4.2  CL 104  CO2 23  GLUCOSE 100*  BUN 11  CREATININE 0.77  CALCIUM  9.5      Intake/Output Summary (Last 24 hours) at 10/20/2023 0850 Last data filed at 10/20/2023 0752 Gross per 24 hour  Intake 600 ml  Output --  Net 600 ml        Physical Exam: Vital Signs Blood pressure (!) 150/102, pulse 91, temperature 97.9 F (36.6 C), resp. rate 17, height 5' 7 (1.702 m), weight 83.5 kg, SpO2 100%.      General: awake, alert, appropriate, sitting up in w/c at bedside; NAD HENT: conjugate gaze; oropharynx moist CV: regular rate and rhythm; no JVD Pulmonary: CTA B/L; no W/R/R- good air movement GI: soft, NT, ND, (+)BS Psychiatric: appropriate- still anxious about d/c.  Neurological: Ox3  MSK- TTP over L teres- with palpable trigger point Skin: No evidence of breakdown, no evidence of rash MSK: No apparent deformities. Psychiatric: Appropriate mood and affect. Neurological: Ox3.  Sensation grossly intact.  Cranial nerves grossly intact.      Strength:                RUE: 5/5 SA, 5/5 EF, 5/5 EE, 4-/5 WE, 4-/5 FF, 4-/5 FA                LUE:  5/5 SA, 5/5 EF, 5/5 EE, 4-/5 WE, 4-/5 FF, 4-/5 FA                RLE: 5/5 HF, 5/5 KE, 5/5   DF,, 3+/5  PF                 LLE:  5/5 HF, 5/5 KE, 5/5  DF, 5/5  EHL, 2+/5  PF     Prior: No change in strength in Ue's except wrists appears weaker- ~ 3-/5 B/L- BUT OTHERWISE THE SAME  Exam done 12/31- Biceps/triceps 5/5; WE on R 4-/5; L 3+/5; Grip 4/5 on R and 4-/5 on L; FA 2+/5 LE's HF/KE 5-/5; DF 4/5 and PF 4+/5 - is better today NMSK- calves tight still- posteriorly- but not tone 12/30 RUE- biceps/triceps 5/5; WE 4-/5; Grip 4/5; FA 2+/5 LUE- biceps/triceps 5/5; WE 3+/5; Grip 4-/5 and FA 2+/5 LE's- HF/KE 5-/5; DF 4-/5; and PF 4/5 MSK- tight calves- B/L Ext: no clubbing, cyanosis, or edema Psych: pleasant  and cooperative        Assessment/Plan: 1. Functional deficits which require 3+ hours per day of interdisciplinary therapy in a comprehensive inpatient rehab setting. Physiatrist is providing close team supervision and 24 hour management of active medical problems listed below. Physiatrist and rehab team continue to assess barriers to discharge/monitor patient progress toward functional and medical goals  Care Tool:  Bathing    Body parts bathed by patient: Right arm, Left lower leg, Face, Left arm, Chest, Abdomen, Front perineal area, Buttocks, Right upper leg, Left upper leg, Right lower leg         Bathing assist Assist Level: Minimal Assistance - Patient > 75%     Upper Body Dressing/Undressing Upper body dressing   What is the patient wearing?: Pull over shirt    Upper body assist Assist Level: Supervision/Verbal cueing    Lower Body Dressing/Undressing Lower body dressing            Lower body assist Assist for lower body dressing: Moderate Assistance - Patient 50 - 74%     Toileting Toileting    Toileting assist Assist for toileting: Contact Guard/Touching assist     Transfers Chair/bed transfer  Transfers assist  Chair/bed transfer activity did not occur: Refused (patient did not get up)  Chair/bed transfer assist level: Independent with  assistive device Chair/bed transfer assistive device: Armrests, Geologist, Engineering   Ambulation assist      Assist level: Independent with assistive device Assistive device: Rollator Max distance: 300   Walk 10 feet activity   Assist     Assist level: Independent with assistive device Assistive device: Rollator   Walk 50 feet activity   Assist    Assist level: Independent with assistive device Assistive device: Rollator    Walk 150 feet activity   Assist    Assist level: Independent with assistive device Assistive device: Rollator    Walk 10 feet on uneven surface  activity   Assist Walk 10 feet on uneven surfaces activity did not occur: Safety/medical concerns (fatigue)   Assist level: Supervision/Verbal cueing Assistive device: Rollator   Wheelchair     Assist Is the patient using a wheelchair?: No Type of Wheelchair: Manual    Wheelchair assist level: Supervision/Verbal cueing Max wheelchair distance: 150    Wheelchair 50 feet with 2 turns activity    Assist        Assist Level: Supervision/Verbal cueing   Wheelchair 150 feet activity     Assist      Assist Level: Supervision/Verbal cueing   Blood pressure (!) 150/102, pulse 91, temperature 97.9 F (36.6 C), resp. rate 17, height 5' 7 (1.702 m), weight 83.5 kg, SpO2 100%.  Medical Problem List and Plan: 1. 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  Antithrombotics: -DVT/anticoagulation:  Pharmaceutical: Lovenox              -antiplatelet therapy: N/A   3.Chronic  LBP/Pain Management:  Continue oxycodone  prn-->managed by PCP             --on Gabapentin  100 mg BID for neuropathy -> Per discussion with patient on admission, ongoing hypersensitivity on bottom of feet, discussed gabapentin  adjustment vs. Lyrica, opted to switch to gabapentin  300 mg nightly.   12/24- will change Gabapentin  to 400 mg TID for now- can titrate up more if needed; will also add Robaxin  500 mg q6 hours prn for muscle spasms 12/28- adjusted gabapentin  to 600mg  tid --it's helping  -adjusted robaxin  to 1000mg  q6 prn  -ordered kpad  -discussed pm and am stretching of calves and hamstrings  12/29--kpad helping   -will increase gabapentin  to 600mg  qid   -reassured pt re: presentation, anxiety is an issue here  12/30- pt having 7-8/10 back pain, but is normal for him- due to OA in his low back- educated pt on how to treat muscle spasms in calves  12/31- changed Robaxin  to Zanaflex  4 mg BID- first dose at 630 am  1/1- Pt reports muscle spasms slightly better but still twinging this AM  1/2- added Duloxetine  30 mg at bedtime and will increase in 3 days if tolerated  -also added gabapentin  600 mg qday prn for times when nerve pain worse (feeling cold hands an  1/3- explained will take til Monday/Tuesday til any improvement in nerve pain from Duloxetine - con't gabapentin  and prn gabapentin  as well--no complaints over this weekend  1/6- less nerve pain  1/7- can use theragun for trigger point in L teres-  4. Mood/Behavior/Sleep: Stable on Zoloft  with low dose klonopin  prn. LCSW to follow for evaluation and support.              -antipsychotic agents: N/A             --has been using Klonopin  for sleep   5. Neuropsych/cognition: This patient is capable of making decisions on his own behalf. 6. Skin/Wound Care: Routine pressure relief measures.  7. Fluids/Electrolytes/Nutrition: Monitor I/O. Check CMET in am 8. HTN: Monitor BP TID--continue  metoprolol  and irbesartan   1/1-1/6 BP controlled- con't regimen  1/7-  BP spikes in AM- but otherwise, well controlled- for past 2 days, but controlled prior- think he's stressed about d/c. Will have him f/u with PCP 9. Hyponatremia: resolved     Latest Ref Rng & Units 10/19/2023    5:54 AM 10/15/2023    6:10 AM 10/12/2023    5:52 AM  BMP  Glucose 70 - 99 mg/dL 899  97  86   BUN 6 - 20 mg/dL 11  9  10    Creatinine 0.61 - 1.24 mg/dL 9.22  9.22  9.20   Sodium 135 - 145 mmol/L 138  138  137   Potassium 3.5 - 5.1 mmol/L 4.2  4.2  4.5   Chloride 98 - 111 mmol/L 104  102  102   CO2 22 - 32 mmol/L 23  24  25    Calcium  8.9 - 10.3 mg/dL 9.5  9.5  9.4     10. H/o anxiety disorder: Off Buspar  as was ineffective. Continue Klonopin  bid prn  1/3- takes Klonopin  1-2x/day 11. H/o Depression: Managed with low dose Zoloft .  12. Hypothyroid: Continue synthroid  125 mc daily for supplement. 1/2- pt askin gif can rescheck labs- explained that shouldn't check closer than 3 months- also explained that in acute setting- like being ill/having GBS, can make TSH labs haywire- needs to be checked out of hospital.   1/4: Per  patient request, reviewed chart; dose of thyroid  med changed from 150 mg to 125 mg 11/22; high variability between high and low TSH before then.  Has been approximately 1.5 months - no s/s toxic thyroid , can recheck TSH/free T4 in AM but if not significantly uptrending agree with waiting for 3 months for further adjustment 1-5: TSH, free T4 appropriate/improving.  Advised patient to continue current dose of medication.  He is happy with this result.    13. Cough/wheezing  12/24- no elevated WBC; Afebrile- will treat Sx's- with Mucinex  600 mg BID, Tessalon  pearls scheduled 100 mg TID for 5 days and Albuterol  inhaler, per pt request, not nebs-   12/29--sx have largely resolved  1/1- pt reports still has cough- asking for inhaler- which I agree with- had pneumonia 2 months ago- and said cough hasn't resolved so far.   1/2- doing a little better with albuterol  inhaler-  doesn't like nebs- tried in past- will restart Tessalon  pearls 100 mg TID for 5 days.   1/3- denies as much cough this AM - stable, no complaints over this weekend  1/6- reports having yellow mucus- lungs completely clear- pt worred might get pneumonia- reassured him no signs of this.   1/7- coughing stable- rare-send home on Albuterol  inhaler prn  I spent a total of 34    minutes on total care today- >50% coordination of care- due to d/w with pt about cannot be his PCP; about therapies when discharged; no creatine- can cause kidney issues and f/u plans- also d/w PA about d/c.     The patient is medically ready for discharge to home and will need follow-up with [facility] PM[facility]. In addition, they will need to follow up with their PCP, Neurology for GBS/AIDP.   LOS: 15 days A FACE TO FACE EVALUATION WAS PERFORMED  Toivo Bordon 10/20/2023, 8:50 AM

## 2023-10-20 NOTE — Discharge Summary (Signed)
 Physician Discharge Summary  Patient ID: Jonathan Luna MRN: [id number] DOB/AGE: [birth year] 58 y.o.  Admit date: 10/05/2023 Discharge date: 10/20/2023  Discharge Diagnoses:  Principal Problem:   Acute inflammatory demyelinating polyneuropathy (HCC) Active Problems:   Low back pain   Long-term current use of opiate analgesic   Essential hypertension   Peripheral neuropathy   Paresthesia   Coping style affecting medical condition   Discharged Condition: good  Significant Diagnostic Studies: DG FL GUIDED LUMBAR PUNCTURE Result Date: 10/12/2023 CLINICAL DATA:  Guillain-Barre syndrome. Request for diagnostic lumbar puncture. EXAM: DIAGNOSTIC LUMBAR PUNCTURE UNDER FLUOROSCOPIC GUIDANCE FLUOROSCOPY TIME:  Radiation Exposure Index (as provided by the fluoroscopic device): 0.80 mGy Number of Acquired Images:  1 PROCEDURE: Informed consent was obtained from the patient prior to the procedure, including potential complications of headache, allergy , and pain. With the patient prone, the lower back was prepped with Betadine. 1% Lidocaine  was used for local anesthesia. Lumbar puncture was performed at the L4-L5 level using a 20 gauge needle with return of clear, colorless CSF with an opening pressure of 25 cm water. Approximately 14 ml of CSF were obtained for laboratory studies. Closing pressure obtained at 15 cm water. The patient tolerated the procedure well and there were no apparent complications. IMPRESSION: Technically successful lumbar puncture from L4-L5 level without complication. Procedure performed by [name] Rusk PA-C and supervised by Dr. Marcey Moan Electronically Signed   By: Marcey Moan M.D.   On: 10/12/2023 14:45    Labs:  Basic Metabolic Panel:    Latest Ref Rng & Units 10/19/2023    5:54 AM 10/15/2023    6:10 AM 10/12/2023    5:52 AM  BMP  Glucose 70 - 99 mg/dL 899  97  86   BUN 6 - 20 mg/dL 11  9  10    Creatinine 0.61 - 1.24 mg/dL 9.22  9.22  9.20   Sodium 135 - 145 mmol/L  138  138  137   Potassium 3.5 - 5.1 mmol/L 4.2  4.2  4.5   Chloride 98 - 111 mmol/L 104  102  102   CO2 22 - 32 mmol/L 23  24  25    Calcium  8.9 - 10.3 mg/dL 9.5  9.5  9.4      CBC:    Latest Ref Rng & Units 10/19/2023    5:54 AM 10/15/2023    6:10 AM 10/12/2023    5:52 AM  CBC  WBC 4.0 - 10.5 K/uL 6.9  5.9  7.2   Hemoglobin 13.0 - 17.0 g/dL 85.2  85.5  85.8   Hematocrit 39.0 - 52.0 % 43.1  42.2  41.3   Platelets 150 - 400 K/uL 422  384  378      CBG: No results for input(s): GLUCAP in the last 168 hours.  Brief HPI:   Jonathan Luna is a 58 y.o. male with history of HTN, chronic pain, depression, panic attacks, juvenile cataracts, PNA  07/2023 followed by onset of numbness, tingling and burning in feet and toes progressing to bilateral hand by end of month.  He was evaluated by Dr. Ross on 12/18 with EMG revealing acute demyelinating pulm radiculopathy.  He was sent to hospital for treatment and started on IVIG x 5 days.  Hospital course significant for issues with low blood pressures as well as hypoxia with an question of potential stroke due to left facial weakness on 12/19.  MRI brain done and was negative for acute changes.  He was noted  to have improvement in strength but not back to baseline.  Dr. Onita recommended follow up LP on 12/31.  Therapy was working with patient was requiring mod to max assist with mobility.  CIR was recommended due to functional decline.   Hospital Course: Jonathan Luna was admitted to rehab 10/05/2023 for inpatient therapies to consist of PT and OT at least three hours five days a week. Past admission physiatrist, therapy team and rehab RN have worked together to provide customized collaborative inpatient rehab.  He has blood pressures were monitored on TID basis and has been on current regimen. He continued to report hypersensitivity bilateral lower extremity as well as ongoing issues with numbness of finger and feet.  Gabapentin  has been titrated upwards to 600 mg 4  times daily. Duloxetine  was added and titrated up to 60 mg without SE.  He has also reported tightness of calves and hamstrings. Tizanidine  was added for Robaxin  was weaned off. He was educated on importance of am/pm stretches and K pad ordered for local measures. Chronic back pain has been managed with home regimen of oxycodone  prn.   Team has provided ego support to help manage anxiety in addition to prn Klonpin. Dr. Onita contacted for input on LP and this was performed on 12/30 for follow up. Four tubes sent per recommendations and showed AIDP. He is to follow up with neurology 2 weeks post discharge. Follow-up check of electrolytes showed hyponatremia has resolved and renal status is stable.  Serial CBC is within normal limits.  P.o. intake was good and is continent of bowel and bladder.  He did report issues with non productive cough  intermittently. He was educated on pulmonary hygiene and symptom management with Mucinex , Tessalon  perles as well as prn albuterol  in am to help with symptoms. He has made steady gains during his stay and is independent with use of rollater. He will continue to receive follow up outpatient PT and OT at [facility] after discharge.    Rehab course: During patient's stay in rehab weekly team conferences were held to monitor patient's progress, set goals and discuss barriers to discharge. At admission, patient required min assist with mobility and max assist with ADL tasks. He has had improvement in activity tolerance, balance, postural control as well as ability to compensate for deficits. He has had improvement in functional use RUE/LUE  and RLE/LLE as well as improvement in awareness. He is able to complete ADL tasks at modified independent level. He's modified independent for transfers and to ambulate 300' with use of rollater.    Discharge disposition: 01-Home or Self Care  Diet: Regular.   Special Instructions: No driving or strenuous activity till  cleared by MD.  Discharge Instructions     Ambulatory referral to Neurology   Complete by: As directed    An appointment is requested in approximately: 2 weeks post hospital follow up. For discharge 01/07- thanks   Ambulatory referral to Physical Medicine Rehab   Complete by: As directed    Hospital follow up      Allergies as of 10/20/2023       Reactions   Amoxil  [amoxicillin ] Other (See Comments)   Pt states that it does not work for him when he had back to back pneumonia   Effexor [venlafaxine] Diarrhea   Prozac  [fluoxetine  Hcl] Diarrhea        Medication List     TAKE these medications    aspirin  EC 81 MG tablet Take  81 mg by mouth daily. Swallow whole.   benzonatate  100 MG capsule Commonly known as: TESSALON  Take 1 capsule (100 mg total) by mouth 3 (three) times daily.   clonazePAM  0.5 MG tablet Commonly known as: KLONOPIN  Take 1 tablet (0.5 mg total) by mouth 2 (two) times daily as needed for anxiety.   DULoxetine  60 MG capsule Commonly known as: CYMBALTA  Take 1 capsule (60 mg total) by mouth at bedtime.   fluticasone  50 MCG/ACT nasal spray Commonly known as: FLONASE  Place 2 sprays into both nostrils daily.   gabapentin  300 MG capsule Commonly known as: NEURONTIN  Take 2 capsules (600 mg total) by mouth 4 (four) times daily. What changed: when to take this   guaiFENesin  600 MG 12 hr tablet Commonly known as: MUCINEX  Take 1 tablet (600 mg total) by mouth 2 (two) times daily. Notes to patient: Wean to off as symptoms improve   irbesartan  300 MG tablet Commonly known as: AVAPRO  Take 1 tablet (300 mg total) by mouth daily. What changed:  when to take this additional instructions   metoprolol  succinate 25 MG 24 hr tablet Commonly known as: Toprol  XL Take 1 tablet (25 mg total) by mouth daily.   oxyCODONE -acetaminophen  5-325 MG tablet Commonly known as: PERCOCET/ROXICET Take 1 tablet by mouth every 8 (eight) hours as needed for severe pain (pain  score 7-10). Notes to patient: You got 90 pills on 09/04/23 and have been in the hospital since 09/30/23. You should have about 5 day supply at home.   sertraline  25 MG tablet Commonly known as: ZOLOFT  Take 1 tablet (25 mg total) by mouth daily.   tiZANidine  4 MG tablet Commonly known as: ZANAFLEX  Take 1 tablet (4 mg total) by mouth 2 (two) times daily.   Unithroid  125 MCG tablet Generic drug: levothyroxine  Take 1 tablet (125 mcg total) by mouth daily before breakfast. What changed: when to take this   Vitamin D -3 25 MCG (1000 UT) Caps Take 1 capsule by mouth daily.        Follow-up Information     Joshua Debby CROME, MD Follow up.   Specialty: Internal Medicine Why: Call in 1-2 days for post hospital follow up Contact information: [address] [place] [address] [phone]         Cornelio Bouchard, MD Follow up.   Specialty: Physical Medicine and Rehabilitation Why: office will call you with follow up appointment Contact information: [address] [place] [address] [phone]         Onita Duos, MD Follow up on 11/04/2023.   Specialty: Neurology Why: follow up appointment at 11 am Contact information: [address] [place] [address] [phone]                 Signed: Sharlet GORMAN Schmitz 10/21/2023, 5:09 PM

## 2023-10-20 NOTE — Telephone Encounter (Signed)
 Patient called in states he forgot to request an inhaler to help with his wheezing. Patient would like it sent to [facility] on [address] in [place]

## 2023-10-21 ENCOUNTER — Telehealth: Payer: Self-pay

## 2023-10-21 MED ORDER — ALBUTEROL SULFATE HFA 108 (90 BASE) MCG/ACT IN AERS: 1.0000 | INHALATION_SPRAY | Freq: Four times a day (QID) | RESPIRATORY_TRACT | 0 refills | Status: DC | PRN

## 2023-10-21 NOTE — Addendum Note (Signed)
 Addended by: Genice Rouge on: 10/21/2023 01:00 PM   Modules accepted: Orders

## 2023-10-21 NOTE — Transitions of Care (Post Inpatient/ED Visit) (Signed)
 10/21/2023  Name: Jonathan Luna MRN: [id number] DOB: [birth year]  Today's TOC FU Call Status: Today's TOC FU Call Status:: Successful TOC FU Call Completed TOC FU Call Complete Date: 10/21/23 Patient's Name and Date of Birth confirmed.  Transition Care Management Follow-up Telephone Call Date of Discharge: 10/20/23 Discharge Facility: [facility]) Type of Discharge: Inpatient Admission Primary Inpatient Discharge Diagnosis:: [facility] How have you been since you were released from the hospital?: Better Any questions or concerns?: No  Items Reviewed: Did you receive and understand the discharge instructions provided?: Yes Medications obtained,verified, and reconciled?: Yes (Medications Reviewed) Any new allergies since your discharge?: No Dietary orders reviewed?: Yes Do you have support at home?: Yes People in Home: spouse  Medications Reviewed Today: Medications Reviewed Today     Reviewed by Emmitt Pan, LPN (Licensed Practical Nurse) on 10/21/23 at [phone]  Med List Status: <None>   Medication Order Taking? Sig Documenting Provider Last Dose Status Informant  aspirin  EC 81 MG tablet [id number] No Take 81 mg by mouth daily. Swallow whole. [provider] 09/30/2023 Active Self, Pharmacy Records  benzonatate  (TESSALON ) 100 MG capsule [id number]  Take 1 capsule (100 mg total) by mouth 3 (three) times daily. Maurice Sharlet RAMAN, PA-C  Active   Cholecalciferol  (VITAMIN D -3) 25 MCG (1000 UT) CAPS [id number] No Take 1 capsule by mouth daily. [provider] 09/30/2023 Active Self, Pharmacy Records  clonazePAM  (KLONOPIN ) 0.5 MG tablet [id number]  Take 1 tablet (0.5 mg total) by mouth 2 (two) times daily as needed for anxiety. Maurice Sharlet RAMAN, PA-C  Active   DULoxetine  (CYMBALTA ) 60 MG capsule [id number]  Take 1 capsule (60 mg total) by mouth at bedtime. Maurice Sharlet RAMAN, PA-C  Active   fluticasone  (FLONASE ) 50 MCG/ACT nasal spray [id number] No Place 2 sprays into both nostrils  daily. Joshua Debby LITTIE, MD 09/29/2023 Active Self, Pharmacy Records           Med Note IDAMAE ALFREIDA LITTIE   Wed Sep 30, 2023  6:05 PM)    gabapentin  (NEURONTIN ) 300 MG capsule [id number]  Take 2 capsules (600 mg total) by mouth 4 (four) times daily. Maurice Sharlet RAMAN, PA-C  Active   guaiFENesin  (MUCINEX ) 600 MG 12 hr tablet [id number]  Take 1 tablet (600 mg total) by mouth 2 (two) times daily. Maurice Sharlet RAMAN, PA-C  Active   irbesartan  (AVAPRO ) 300 MG tablet [id number]  Take 1 tablet (300 mg total) by mouth daily. Maurice Sharlet RAMAN, PA-C  Active   metoprolol  succinate (TOPROL  XL) 25 MG 24 hr tablet [id number]  Take 1 tablet (25 mg total) by mouth daily. Maurice Sharlet RAMAN, PA-C  Active   oxyCODONE -acetaminophen  (PERCOCET/ROXICET) 5-325 MG tablet [id number] No Take 1 tablet by mouth every 8 (eight) hours as needed for severe pain (pain score 7-10). Joshua Debby LITTIE, MD 09/30/2023 Morning Active Self, Pharmacy Records  sertraline  (ZOLOFT ) 25 MG tablet [id number]  Take 1 tablet (25 mg total) by mouth daily. Maurice Sharlet RAMAN, PA-C  Active   tiZANidine  (ZANAFLEX ) 4 MG tablet [id number]  Take 1 tablet (4 mg total) by mouth 2 (two) times daily. Love, Pamela S, PA-C  Active   UNITHROID  125 MCG tablet [id number] No Take 1 tablet (125 mcg total) by mouth daily before breakfast.  Patient taking differently: Take 125 mcg by mouth at bedtime.   Joshua Debby LITTIE, MD 09/29/2023 Bedtime Active Self, Pharmacy Records            Home Care and Equipment/Supplies:  Were Home Health Services Ordered?: NA Any new equipment or medical supplies ordered?: NA  Functional Questionnaire: Do you need assistance with bathing/showering or dressing?: No Do you need assistance with meal preparation?: No Do you need assistance with eating?: No Do you have difficulty maintaining continence: No Do you need assistance with getting out of bed/getting out of a chair/moving?: No Do you have difficulty managing or taking your medications?:  No  Follow up appointments reviewed: PCP Follow-up appointment confirmed?: No (no avail appts, sent message to staff to schedule) MD Provider Line Number:[phone] Given: No Follow-up Provider: [facility] Follow-up appointment confirmed?: Yes Date of Specialist follow-up appointment?: 10/23/23 Follow-Up Specialty Provider:: AF Do you need transportation to your follow-up appointment?: No Do you understand care options if your condition(s) worsen?: Yes-patient verbalized understanding    SIGNATURE Julian Lemmings, LPN [facility] Nurse Health Advisor Direct Dial [phone]

## 2023-10-21 NOTE — Telephone Encounter (Signed)
 Jonathan Luna has requested an inhaler for us  at home. He stated he was getting breathing treatments in the hospital. And in the morning he has complains of increased mucus.   Patient had been advised to call his PCP. However Dr. Lovorn will be informed.   Call back phone [phone]  (Dr. Lovorn is not in the office).

## 2023-10-22 ENCOUNTER — Ambulatory Visit: Payer: 59 | Admitting: Physician Assistant

## 2023-10-22 NOTE — Therapy (Signed)
 OUTPATIENT PHYSICAL THERAPY NEURO EVALUATION   Patient Name: Jonathan Luna MRN: [id number] DOB:[birth year], 58 y.o., male Today's Date: 10/22/2023   PCP: Debby Molt REFERRING PROVIDER: Toribio Pitch  END OF SESSION:   Past Medical History:  Diagnosis Date   Allergy     Anxiety    Arthritis    Cataract    Community acquired pneumonia 08/28/2023   Depression    Detached retina    Heart murmur    History of bilateral cataract extraction    Hyperlipidemia    Hypothyroidism    Low back pain    Panic attack    Panic attacks    Past Surgical History:  Procedure Laterality Date   CATARACT EXTRACTION     EYE SURGERY     Patient Active Problem List   Diagnosis Date Noted   Coping style affecting medical condition 10/18/2023   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/05/2023   AIDP (acute inflammatory demyelinating polyneuropathy) (HCC) 09/30/2023   Paresthesia 09/22/2023   Gait abnormality 09/22/2023   Tremor 09/04/2023   Weakness 09/04/2023   DOE (dyspnea on exertion) 09/04/2023   Peripheral neuropathy 08/28/2023   Tachycardia 08/18/2023   Pneumonia of right upper lobe due to infectious organism 08/18/2023   Hypogonadism male 10/24/2021   Vitamin D  deficiency disease 09/08/2018   Essential hypertension 09/07/2018   Long-term current use of opiate analgesic 04/07/2018   Encounter for long-term opiate analgesic use 08/03/2017   Colon cancer screening 12/10/2016   Pure hyperglyceridemia 09/11/2016   Low back pain 05/08/2016   Allergic rhinitis 12/13/2012   Chronic pain disorder 09/29/2012   Routine general medical examination at a health care facility 10/08/2011   Irritable bowel syndrome 10/16/2010   DDD (degenerative disc disease), lumbar 04/09/2010   Hypothyroidism 05/03/2009   Hyperlipidemia with target LDL less than 130 05/03/2009   Panic anxiety syndrome 05/03/2009    ONSET DATE: 09/30/23  REFERRING DIAG: G35, G61  THERAPY DIAG:  No  diagnosis found.  Rationale for Evaluation and Treatment: Rehabilitation  SUBJECTIVE:                                                                                                                                                                                             SUBJECTIVE STATEMENT: Doing pretty good. Since I been out the hospital, my legs feel weaker. I feel like my calves are weak and I have no push off. I have feeling in my feet but they are a little numb.   PERTINENT HISTORY: Brief HPI:   Jonathan Luna is a 58 y.o. male with history of HTN, chronic pain, depression,  panic attacks, juvenile cataracts, PNA  07/2023 followed by onset of numbness, tingling and burning in feet and toes progressing to bilateral hand by end of month.  He was evaluated by Dr. Ross on 12/18 with EMG revealing acute demyelinating pulm radiculopathy.  He was sent to hospital for treatment and started on IVIG x 5 days.  Hospital course significant for issues with low blood pressures as well as hypoxia with an question of potential stroke due to left facial weakness on 12/19.  MRI brain done and was negative for acute changes.  He was noted to have improvement in strength but not back to baseline.  Dr. Onita recommended follow up LP on 12/31.  Therapy was working with patient was requiring mod to max assist with mobility.  CIR was recommended due to functional decline.  At admission, patient required min assist with mobility and max assist with ADL tasks. He has had improvement in activity tolerance, balance, postural control as well as ability to compensate for deficits. He has had improvement in functional use RUE/LUE  and RLE/LLE as well as improvement in awareness. He is able to complete ADL tasks at modified independent level. He's modified independent for transfers. He has made steady gains during his stay and is independent with use of rollater. He will continue to receive follow up outpatient PT and OT at [facility] after discharge   PAIN:  Are you having pain? Yes: NPRS scale: 7/10 Pain location: low back Pain description: arthritis pain, chronic, ache Aggravating factors: certain stretches, overworking it Relieving factors: oxycodone , trying to exercise   PRECAUTIONS: Fall  RED FLAGS: None   WEIGHT BEARING RESTRICTIONS: No  FALLS: Has patient fallen in last 6 months? Yes. Number of falls 5  LIVING ENVIRONMENT: Lives with: lives with their spouse Lives in: House/apartment Stairs: No Has following equipment at home: Vannie - 2 wheeled, Environmental Consultant - 4 wheeled, Marine Scientist  PLOF: Independent and Independent with basic ADLs  PATIENT GOALS: get stronger, get back 100%   OBJECTIVE:  Note: Objective measures were completed at Evaluation unless otherwise noted.  DIAGNOSTIC FINDINGS: FINDINGS: Brain: No acute infarct, mass effect or extra-axial collection. No acute or chronic hemorrhage. Normal white matter signal, parenchymal volume and CSF spaces. The midline structures are normal.   Vascular: Normal flow voids.   Skull and upper cervical spine: Normal calvarium and skull base. Visualized upper cervical spine and soft tissues are normal.   Sinuses/Orbits:No paranasal sinus fluid levels or advanced mucosal thickening. No mastoid or middle ear effusion. Normal orbits.   IMPRESSION: Normal brain MRI  COGNITION: Overall cognitive status: Within functional limits for tasks assessed   SENSATION: WFL  COORDINATION: Decreased coordination in BLE   MUSCLE LENGTH: Hamstrings: moderate tightness BLE  POSTURE: rounded shoulders and forward head  LOWER EXTREMITY ROM:   grossly WNL except limited DF   LOWER EXTREMITY MMT:  grossly 4+/5 BLE, DF and eversion 3/5    TRANSFERS: Assistive device utilized: Environmental Consultant - 4 wheeled  Sit to stand: Modified independence Stand to sit: Modified independence  STAIRS: Level of Assistance: Min A Stair Negotiation Technique: Step  to Pattern with Bilateral Rails Number of Stairs: 3  Height of Stairs: 4-6   GAIT: Gait pattern: step to pattern, decreased arm swing- Right, decreased arm swing- Left, decreased step length- Right, decreased step length- Left, decreased stride length, ataxic, narrow BOS, poor foot clearance- Right, and poor foot clearance- Left Distance walked: in clinic distances Assistive device  utilized: Environmental Consultant - 4 wheeled Level of assistance: Modified independence   FUNCTIONAL TESTS:  5 times sit to stand: 21.48s from chair  Timed up and go (TUG): 21.46s Berg Balance Scale: 36/56                                                                                                                              TREATMENT DATE: EVAL 10/23/23    PATIENT EDUCATION: Education details: POC and HEP Person educated: Patient Education method: Explanation Education comprehension: verbalized understanding  HOME EXERCISE PROGRAM: Access Code: [email or web address] URL: [email or web address] Date: 10/23/2023 Prepared by: Almetta Fam  Exercises - Heel Raises with Counter Support  - 1 x daily - 7 x weekly - 2 sets - 10 reps - Heel Toe Raises with Counter Support  - 1 x daily - 7 x weekly - 2 sets - 10 reps - Standing Single Leg Stance with Counter Support  - 1 x daily - 7 x weekly - 10 reps - 10 hold - Standing Tandem Balance with Counter Support  - 1 x daily - 7 x weekly - 10 reps - 10 hold - Sit to Stand  - 1 x daily - 7 x weekly - 2 sets - 10 reps  GOALS: Goals reviewed with patient? Yes  SHORT TERM GOALS: Target date: 12/04/23  Patient will be independent with initial HEP. Baseline: given 10/23/23 Goal status: INITIAL  2.  Patient will demonstrate decreased fall risk by scoring < 15 sec on TUG w/AD. Baseline: 21.45s w/rollator  Goal status: INITIAL  LONG TERM GOALS: Target date: 01/15/24  Patient will be independent with advanced/ongoing HEP to improve outcomes and carryover.  Baseline:  Goal  status: INITIAL  2.  Patient will be able to ambulate 500' with LRAD or w/o with good safety to access community.  Baseline: walking with RW  Goal status: INITIAL  3.  Patient will demonstrate decreased fall risk by scoring < 15 sec on TUG without rollator. Baseline: 21.46s w/rollator Goal status: INITIAL   4.  Patient will demonstrate improved functional LE strength as demonstrated by 5xSTS <15s without UE use. Baseline: 21.48s from chair w/decreased descend Goal status: INITIAL  5.  Patient will score 44 on Berg Balance test to demonstrate lower risk of falls. (MCID= 8 points) .  Baseline: 36 Goal status: INITIAL  ASSESSMENT:  CLINICAL IMPRESSION: Patient is a 58 y.o. 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 He is currently ambulating with a rollator. Patient has decreased strength in his BLE especially in his ankles. He has decreased ROM and strength with ankle DF and eversion. He presents as a high fall risk as seen with the functional testing done today. Patient is very motivated to get back to his PLOF and return to being independent with all his tasks. He will benefit from skilled PT to address his deficits to improve his  gait, balance, and overall strength and stamina.   OBJECTIVE IMPAIRMENTS: Abnormal gait, decreased activity tolerance, decreased balance, decreased coordination, decreased endurance, difficulty walking, decreased ROM, and decreased strength.   ACTIVITY LIMITATIONS: squatting, transfers, and locomotion level  PARTICIPATION LIMITATIONS: cleaning, laundry, driving, community activity, and yard work  [facility] POTENTIAL: Good  CLINICAL DECISION MAKING: Stable/uncomplicated  EVALUATION COMPLEXITY: Low  PLAN:  PT FREQUENCY: 1-2x/week  PT DURATION: 12 weeks  PLANNED INTERVENTIONS: 97110-Therapeutic exercises, 97530- Therapeutic activity, [id number]-  Neuromuscular re-education, 97535- Self Care, 02859- Manual therapy, [phone]- Gait training, [phone]- Electrical stimulation (manual), Patient/Family education, Balance training, Stair training, Dry Needling, Joint mobilization, Joint manipulation, Spinal manipulation, Spinal mobilization, Cryotherapy, and Moist heat  PLAN FOR NEXT SESSION: stretches for HS and calves, start some light strengthening and balance, work on ankle strengthening    [employer], PT 10/22/2023, 10:25 AM

## 2023-10-22 NOTE — Therapy (Signed)
 OUTPATIENT OCCUPATIONAL THERAPY NEURO EVALUATION  Patient Name: Jonathan Luna MRN: [id number] DOB:01/06/1966, 58 y.o., male Today's Date: 10/23/2023  PCP: Dr. Debby Molt REFERRING PROVIDER: Pegge Toribio PARAS, PA-C   END OF SESSION:  OT End of Session - 10/23/23 1106     Visit Number 1    Number of Visits 12    Date for OT Re-Evaluation 12/22/23    Authorization Type [employer] 23 visits OT/PT combined, OOP met, serv yr 3/1    OT Start Time 1010    OT Stop Time 1100    OT Time Calculation (min) 50 min    Activity Tolerance Patient tolerated treatment well    Behavior During Therapy WFL for tasks assessed/performed             Past Medical History:  Diagnosis Date   Allergy     Anxiety    Arthritis    Cataract    Community acquired pneumonia 08/28/2023   Depression    Detached retina    Heart murmur    History of bilateral cataract extraction    Hyperlipidemia    Hypothyroidism    Low back pain    Panic attack    Panic attacks    Past Surgical History:  Procedure Laterality Date   CATARACT EXTRACTION     EYE SURGERY     Patient Active Problem List   Diagnosis Date Noted   Coping style affecting medical condition 10/18/2023   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/05/2023   AIDP (acute inflammatory demyelinating polyneuropathy) (HCC) 09/30/2023   Paresthesia 09/22/2023   Gait abnormality 09/22/2023   Tremor 09/04/2023   Weakness 09/04/2023   DOE (dyspnea on exertion) 09/04/2023   Peripheral neuropathy 08/28/2023   Tachycardia 08/18/2023   Pneumonia of right upper lobe due to infectious organism 08/18/2023   Hypogonadism male 10/24/2021   Vitamin D  deficiency disease 09/08/2018   Essential hypertension 09/07/2018   Long-term current use of opiate analgesic 04/07/2018   Encounter for long-term opiate analgesic use 08/03/2017   Colon cancer screening 12/10/2016   Pure hyperglyceridemia 09/11/2016   Low back pain 05/08/2016   Allergic rhinitis  12/13/2012   Chronic pain disorder 09/29/2012   Routine general medical examination at a health care facility 10/08/2011   Irritable bowel syndrome 10/16/2010   DDD (degenerative disc disease), lumbar 04/09/2010   Hypothyroidism 05/03/2009   Hyperlipidemia with target LDL less than 130 05/03/2009   Panic anxiety syndrome 05/03/2009    ONSET DATE: 09/30/23   REFERRING DIAG: G61.0 (Inflammatory Polyneuropathy)  THERAPY DIAG:  Muscle weakness (generalized)  Other lack of coordination  Other disturbances of skin sensation  Unsteadiness on feet  Rationale for Evaluation and Treatment: Rehabilitation  SUBJECTIVE:    SUBJECTIVE STATEMENT: Pt reports that he feels a little weaker since he left the hospital in legs.    Pt accompanied by: self  PERTINENT HISTORY:  AIDP/Guillain-Barre Syndrome  PMH significant for HTN, HLD, anxiety/depression, arthritis, hypothyroidism, neuropathy impaired gait admitted to hospital 09/30/23.  (Previously hospitalized for pneumonia 08/28/23)  PRECAUTIONS: Fall  WEIGHT BEARING RESTRICTIONS: No  PAIN:  Are you having pain? No  Hands feel cold  FALLS: Has patient fallen in last 6 months? Yes. 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  OBJECTIVE:  Note: Objective measures were completed at Evaluation unless otherwise  noted.  HAND DOMINANCE: Right  ADLs: Transfers/ambulation related to ADLs:  mod I Eating: mod I with difficulty and wrist dropping and tilting cup--using 2 hands, wife cutting food, difficulty opening packages Grooming: mod I, fatigue (may need to switch hands) UB Dressing: mod I, incr time/difficulty  LB Dressing: difficulty putting on shoes and tying, fatigue Toileting: mod I, incr time Bathing: mod I Tub Shower transfers: supervision Equipment: Transfer tub bench and Grab bars  IADLs: Light housekeeping: wife and sister performing  Meal Prep: pt performed most prior, wife performing now.  Pt able to warm items in microwave, get item from fridge Community mobility: pt drove today. Advised pt to wait until cleared by physician. Medication management: mod I Handwriting: 50% legible, has not attempted typing   MOBILITY STATUS: Hx of falls and ambulates with 4 wheeled RW  POSTURE COMMENTS:  rounded shoulders  ACTIVITY TOLERANCE: Activity tolerance: decreased   UPPER EXTREMITY ROM:  BUEs grossly WFL except only approx 50% wrist ext bilaterally, 75% supination bilaterally, and decr ability with thumb to oppose to base of 5th digit bilaterally.  UPPER EXTREMITY MMT:     MMT Right eval Left eval  Shoulder flexion 4-/5 4-/5  Shoulder abduction 3+/5 4/5  Shoulder adduction 4+/5 4+/5  Shoulder extension    Shoulder internal rotation    Shoulder external rotation    Middle trapezius    Lower trapezius    Elbow flexion 4/5 4/5  Elbow extension 4+/5 4+/5  Wrist flexion 4/5 4/5  Wrist extension 3/5 3/5  Wrist ulnar deviation    Wrist radial deviation    Wrist pronation    Wrist supination    (Blank rows = not tested)  HAND FUNCTION: Grip strength: Right: 20 lbs; Left: 30 lbs  COORDINATION: 9 Hole Peg test: Right: 48.06 sec; Left: 62.12 sec  SENSATION: Decr sensation in feet and hands   EDEMA: none noted  COGNITION: Overall cognitive status: Within functional  limits for tasks assessed  VISION: Subjective report: no changes reported Baseline vision: Wears glasses all the time and old injury R eye (hx of detached retina)   OBSERVATIONS: pleasant and motivated                                                                                                                             TREATMENT DATE:  10/23/23:  evaluation completed today; also see pt instructions      PATIENT EDUCATION: Education details: Initial HEP--see pt instructions, advised pt to wait until physician clearance to drive (per hospital d/c instructions), also advised pt against use of weights at this  time due to risk of injury.  Discussed evaluation results/potential goals and role of OT Person educated: Patient Education method: Explanation, Demonstration, and Handouts Education comprehension: verbalized understanding  HOME EXERCISE PROGRAM: 10/23/23:  initial coordination and AROM wrist ext HEP--see pt instructions.   GOALS: Goals reviewed with patient? Yes  SHORT TERM GOALS: Target date: 11/24/23  Pt will be independent with initial HEP for coordination and UE strength. Baseline: Goal status: INITIAL  2.  Pt will be able to write at least 3 sentences with 100% legibility. Baseline:  Goal status: INITIAL  3.  Pt will improve bilateral grip strength by at least 8lbs each hand to assist with opening containers. Baseline: R-20lbs, L-30lbs Goal status: INITIAL  4.  Pt will improve coordination for ADLs as shown by improving time on 9-hole peg test by at least 10sec bilaterally. Baseline:  Right: 48.06 sec; Left: 62.12 sec Goal status: INITIAL  5.  Pt will report ability to perform eating/grooming tasks without fatigue/switching hands and be able to cut food mod I. Baseline:  Goal status: INITIAL  6.  Pt will be able to tilt cup to drink with either hand/wrist independently. Baseline: currently drinking with both hands simultaneously without wrist  movement. Goal status: INITIAL  LONG TERM GOALS: Target date: 12/22/23  Pt will be independent with updated HEP for coordination/strength. Baseline:  Goal status: INITIAL  2.  Pt will be able to type at least 20wpm with at least 90% legibility for work tasks. Baseline:  Goal status: INITIAL  3.   Pt will improve bilateral grip strength by at least 12lbs each hand to assist with opening containers. Baseline: R-20lbs, L-30lbs Goal status: INITIAL  4.  Pt will improve coordination for ADLs as shown by improving time on 9-hole peg test by at least 20sec with L hand. Baseline:  Right: 48.06 sec; Left: 62.12 sec Goal status: INITIAL  5.  Pt will perform simple meal prep mod I and verbalize understanding of safety strategies due to weakness/decr sensation prn. Baseline:  Goal status: INITIAL    ASSESSMENT:  CLINICAL IMPRESSION: Patient is a 59 y.o. male who was seen today for occupational therapy evaluation for AIDP/Guillain-Barre Syndrome.  Pt was admitted to hospital 09/30/23 for neuropathy, weakness, and impaired gait admitted and received IVIG x 5 days.  Previously hospitalized treated for pneumonia 07/2023 and then hospitalized 08/28/23).  PMH also significant for HTN, HLD, anxiety/depression, arthritis, hypothyroidism, DDD/chronic pain, panic attacks, juvenile cataracts.  Pt was independent prior to illness and was working (currently [employer]).  Pt presents today with decr strength, decr coordination, decr activity tolerance, decr balance/functional mobility.  Pt would benefit from occupational therapy to address these deficits for improved ADLs/IADL safety and performance, return to work, and improve UE functional use.  PERFORMANCE DEFICITS: in functional skills including ADLs, IADLs, coordination, dexterity, sensation, ROM, strength, Fine motor control, Gross motor control, mobility, balance, endurance, and UE functional use,  psychosocial skills including environmental adaptation and  habits.   IMPAIRMENTS: are limiting patient from ADLs, IADLs, work, and leisure.   CO-MORBIDITIES: may have co-morbidities  that affects occupational performance. Patient will benefit from skilled OT to address above impairments and improve overall function.  MODIFICATION OR ASSISTANCE TO COMPLETE EVALUATION: Min-Moderate modification of tasks or assist with assess necessary to complete an evaluation.  OT OCCUPATIONAL PROFILE AND HISTORY: Detailed assessment: Review of records and additional review of physical, cognitive, psychosocial history related to current functional performance.  CLINICAL DECISION MAKING: Moderate - several treatment options, min-mod  task modification necessary  REHAB POTENTIAL: Good  EVALUATION COMPLEXITY: Moderate    PLAN:  OT FREQUENCY: 1-2x/week (2x/wk for 4 wks followed by 1x/wk for 4wks due to visit limits)  OT DURATION: 8 weeks  PLANNED INTERVENTIONS: 97535 self care/ADL training, 97110 therapeutic exercise, 97530 therapeutic activity, 97112 neuromuscular re-education, 97140 manual therapy, 97035 ultrasound, 97018 paraffin, 97010 moist heat, 97010 cryotherapy, 97014 electrical stimulation unattended, 97760 Orthotics management and training, 97760 Splinting (initial encounter), passive range of motion, balance training, functional mobility training, energy conservation, patient/family education, and DME and/or AE instructions  RECOMMENDED OTHER SERVICES: none at this time  CONSULTED AND AGREED WITH PLAN OF CARE: Patient  PLAN FOR NEXT SESSION: review and add to HEP (additional coordination, putty, ?cane exercises).   Ojani Berenson, OTR/L 10/23/2023, 11:15 AM

## 2023-10-23 ENCOUNTER — Encounter: Payer: Self-pay | Admitting: Occupational Therapy

## 2023-10-23 ENCOUNTER — Ambulatory Visit: Payer: 59 | Attending: Physician Assistant

## 2023-10-23 ENCOUNTER — Ambulatory Visit: Payer: 59 | Admitting: Occupational Therapy

## 2023-10-23 ENCOUNTER — Ambulatory Visit: Payer: 59 | Admitting: Neurology

## 2023-10-23 ENCOUNTER — Other Ambulatory Visit: Payer: Self-pay | Admitting: Internal Medicine

## 2023-10-23 DIAGNOSIS — R208 Other disturbances of skin sensation: Secondary | ICD-10-CM | POA: Insufficient documentation

## 2023-10-23 DIAGNOSIS — M6281 Muscle weakness (generalized): Secondary | ICD-10-CM | POA: Insufficient documentation

## 2023-10-23 DIAGNOSIS — R278 Other lack of coordination: Secondary | ICD-10-CM

## 2023-10-23 DIAGNOSIS — R2681 Unsteadiness on feet: Secondary | ICD-10-CM | POA: Insufficient documentation

## 2023-10-23 DIAGNOSIS — M51369 Other intervertebral disc degeneration, lumbar region without mention of lumbar back pain or lower extremity pain: Secondary | ICD-10-CM

## 2023-10-23 DIAGNOSIS — G610 Guillain-Barre syndrome: Secondary | ICD-10-CM | POA: Diagnosis present

## 2023-10-23 DIAGNOSIS — R2689 Other abnormalities of gait and mobility: Secondary | ICD-10-CM | POA: Diagnosis present

## 2023-10-23 DIAGNOSIS — G894 Chronic pain syndrome: Secondary | ICD-10-CM

## 2023-10-23 DIAGNOSIS — R296 Repeated falls: Secondary | ICD-10-CM | POA: Insufficient documentation

## 2023-10-23 DIAGNOSIS — M5030 Other cervical disc degeneration, unspecified cervical region: Secondary | ICD-10-CM

## 2023-10-23 DIAGNOSIS — G8929 Other chronic pain: Secondary | ICD-10-CM

## 2023-10-23 NOTE — Patient Instructions (Signed)
    Coordination Activities  Perform the following activities for 5 minutes 1-2 times per day with both hand(s).  Flip cards 1 at a time as fast as you can. Shuffle cards. Pick up coins, buttons, marbles, dried beans/pasta of different sizes and place in container. Pick up coins and stack. Practice writing and/or typing. Screw together nuts and bolts, then unfasten. Wring out wash cloth. Lift wrist hold 5-10sec.  Repeat 10x

## 2023-10-25 ENCOUNTER — Emergency Department (HOSPITAL_COMMUNITY)
Admission: EM | Admit: 2023-10-25 | Discharge: 2023-10-25 | Disposition: A | Discharge status: Home / Self Care | Payer: 59 | Attending: Emergency Medicine | Admitting: Emergency Medicine

## 2023-10-25 ENCOUNTER — Other Ambulatory Visit: Payer: Self-pay

## 2023-10-25 ENCOUNTER — Encounter (HOSPITAL_COMMUNITY): Payer: Self-pay

## 2023-10-25 ENCOUNTER — Emergency Department (HOSPITAL_COMMUNITY): Payer: 59

## 2023-10-25 DIAGNOSIS — Z7982 Long term (current) use of aspirin: Secondary | ICD-10-CM | POA: Diagnosis not present

## 2023-10-25 DIAGNOSIS — I10 Essential (primary) hypertension: Secondary | ICD-10-CM | POA: Diagnosis not present

## 2023-10-25 DIAGNOSIS — Z20822 Contact with and (suspected) exposure to covid-19: Secondary | ICD-10-CM | POA: Insufficient documentation

## 2023-10-25 DIAGNOSIS — R252 Cramp and spasm: Secondary | ICD-10-CM | POA: Insufficient documentation

## 2023-10-25 DIAGNOSIS — Z79899 Other long term (current) drug therapy: Secondary | ICD-10-CM | POA: Insufficient documentation

## 2023-10-25 DIAGNOSIS — R000 Tachycardia, unspecified: Secondary | ICD-10-CM | POA: Insufficient documentation

## 2023-10-25 DIAGNOSIS — R531 Weakness: Secondary | ICD-10-CM | POA: Insufficient documentation

## 2023-10-25 DIAGNOSIS — R202 Paresthesia of skin: Secondary | ICD-10-CM | POA: Insufficient documentation

## 2023-10-25 LAB — COMPREHENSIVE METABOLIC PANEL
ALT: 32 U/L (ref 0–44)
AST: 31 U/L (ref 15–41)
Albumin: 4.1 g/dL (ref 3.5–5.0)
Alkaline Phosphatase: 52 U/L (ref 38–126)
Anion gap: 11 (ref 5–15)
BUN: 15 mg/dL (ref 6–20)
CO2: 23 mmol/L (ref 22–32)
Calcium: 9.7 mg/dL (ref 8.9–10.3)
Chloride: 96 mmol/L — ABNORMAL LOW (ref 98–111)
Creatinine, Ser: 0.82 mg/dL (ref 0.61–1.24)
GFR, Estimated: >60 mL/min (ref >60)
Glucose, Bld: 96 mg/dL (ref 70–99)
Potassium: 4 mmol/L (ref 3.5–5.1)
Sodium: 130 mmol/L — ABNORMAL LOW (ref 135–145)
Total Bilirubin: 0.8 mg/dL (ref 0.0–1.2)
Total Protein: 8 g/dL (ref 6.5–8.1)

## 2023-10-25 LAB — CBC WITH DIFFERENTIAL/PLATELET
Abs Immature Granulocytes: 0.04 10*3/uL (ref 0.00–0.07)
Basophils Absolute: 0.1 10*3/uL (ref 0.0–0.1)
Basophils Relative: 1 %
Eosinophils Absolute: 0.7 10*3/uL — ABNORMAL HIGH (ref 0.0–0.5)
Eosinophils Relative: 6 %
HCT: 43.9 % (ref 39.0–52.0)
Hemoglobin: 15.2 g/dL (ref 13.0–17.0)
Immature Granulocytes: 0 %
Lymphocytes Relative: 32 %
Lymphs Abs: 3.5 10*3/uL (ref 0.7–4.0)
MCH: 30.8 pg (ref 26.0–34.0)
MCHC: 34.6 g/dL (ref 30.0–36.0)
MCV: 89 fL (ref 80.0–100.0)
Monocytes Absolute: 0.9 10*3/uL (ref 0.1–1.0)
Monocytes Relative: 8 %
Neutro Abs: 5.9 10*3/uL (ref 1.7–7.7)
Neutrophils Relative %: 53 %
Platelets: 408 10*3/uL — ABNORMAL HIGH (ref 150–400)
RBC: 4.93 MIL/uL (ref 4.22–5.81)
RDW: 12.5 % (ref 11.5–15.5)
WBC: 11.1 10*3/uL — ABNORMAL HIGH (ref 4.0–10.5)
nRBC: 0 % (ref 0.0–0.2)

## 2023-10-25 LAB — RESP PANEL BY RT-PCR (RSV, FLU A&B, COVID)  RVPGX2
Influenza A by PCR: NEGATIVE
Influenza B by PCR: NEGATIVE
Resp Syncytial Virus by PCR: NEGATIVE
SARS Coronavirus 2 by RT PCR: NEGATIVE

## 2023-10-25 NOTE — Discharge Instructions (Addendum)
 You have been evaluated for your symptoms.  Fortunately your labs today and CT scan of your head did not show any concerning finding.  You did well on your breathing test, which demonstrates your diaphragm is functioning appropriately.  Please call and follow-up closely with your neurologist tomorrow for further managements of your condition.  Return to the ER if you have difficulty breathing, or if you have other concern.

## 2023-10-25 NOTE — ED Provider Triage Note (Signed)
 Emergency Medicine Provider Triage Evaluation Note  DAUNDRE BIEL , a 58 y.o. male  was evaluated in triage.   Review of Systems  Positive:  Negative:   Physical Exam  BP (!) 146/96   Pulse (!) 120   Temp (!) 97.5 F (36.4 C)   Resp 16   SpO2 100%  Gen:   Awake, no distress   Resp:  Normal effort  MSK:   Moves extremities without difficulty  Other:    Medical Decision Making  Medically screening exam initiated at 1:19 PM.  Appropriate orders placed.  DENORRIS REUST was informed that the remainder of the evaluation will be completed by another provider, this initial triage assessment does not replace that evaluation, and the importance of remaining in the ED until their evaluation is complete.  Diagnosed with GBS. Admitted in December 2024 for this.  Pt complains of tingling and numbness in left side of face and ear and lips. Left hand and right hand more weak. Also with muscle twitching diffusely. These symptoms have been progressing since last night.   Hoy Nidia FALCON, [place] 10/25/23 1323

## 2023-10-25 NOTE — Progress Notes (Signed)
 NIF performed with good effort with -40.  Tolerated well.

## 2023-10-25 NOTE — ED Provider Notes (Signed)
 [place] EMERGENCY DEPARTMENT AT [facility] Provider Note   CSN: [id number] Arrival date & time: 10/25/23  1301     History  Chief Complaint  Patient presents with   Tingling   Extremity Weakness    Jonathan Luna is a 58 y.o. male.  The history is provided by the patient, the spouse and medical records. No language interpreter was used.  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  Patient report he was discharged from the hospital 5 days ago.  He was doing better during his hospitalization.  However for the past 3 days he noticed progressive worsening weakness and muscle spasm involving both of his upper extremities as well as muscle spasms throughout his body.  Spasm present at rest worsening with movement.  Last night he endorsed having tingling sensation to his cheeks that has now spread to both side of his face which concerns him.  He denies any significant headache, vision changes, trouble swallowing, breathing, trouble urinating or having abdominal pain.  He mention his symptoms felt similar to when he was hospitalized for GBS except it seems to be a bit more intense.  Home Medications Prior to Admission medications   Medication Sig Start Date End Date Taking? Authorizing Provider  albuterol  (VENTOLIN  HFA) 108 (90 Base) MCG/ACT inhaler Inhale 1-2 puffs into the lungs every 6 (six) hours as needed for wheezing or shortness of breath. 10/21/23   Lovorn, Megan, MD  aspirin  EC 81 MG tablet Take 81 mg by mouth daily. Swallow whole.    [provider]  benzonatate  (TESSALON ) 100 MG capsule Take 1 capsule (100 mg total) by mouth 3 (three) times daily. 10/19/23   Love, Sharlet RAMAN, PA-C  Cholecalciferol  (VITAMIN D -3) 25 MCG (1000 UT) CAPS Take 1 capsule by mouth daily.    [provider]  clonazePAM  (KLONOPIN ) 0.5 MG tablet Take 1 tablet (0.5 mg total) by mouth 2 (two) times daily as needed for anxiety. 10/19/23   Love, Sharlet RAMAN, PA-C  DULoxetine  (CYMBALTA ) 60 MG capsule Take 1 capsule (60 mg total) by mouth at bedtime. 10/19/23   Love, Sharlet RAMAN, PA-C  fluticasone  (FLONASE ) 50 MCG/ACT nasal spray Place 2 sprays into both nostrils daily. 08/18/23   Joshua Debby LITTIE, MD  gabapentin  (NEURONTIN ) 300 MG capsule Take 2 capsules (600 mg total) by mouth 4 (four) times daily. 10/19/23   Love, Sharlet RAMAN, PA-C  guaiFENesin  (MUCINEX ) 600 MG 12 hr tablet Take 1 tablet (600 mg total) by mouth 2 (two) times daily. 10/19/23   Love, Sharlet RAMAN, PA-C  irbesartan  (AVAPRO ) 300 MG tablet Take 1 tablet (300 mg total) by mouth daily. 10/19/23   Love, Sharlet RAMAN, PA-C  metoprolol  succinate (TOPROL  XL) 25 MG 24 hr tablet Take 1 tablet (25 mg total) by mouth daily. 10/19/23   Love, Sharlet RAMAN, PA-C  oxyCODONE -acetaminophen  (PERCOCET/ROXICET) 5-325 MG tablet Take 1 tablet by mouth every 8 (eight) hours as needed for severe pain (pain score 7-10). 09/04/23   Joshua Debby LITTIE, MD  sertraline  (ZOLOFT ) 25 MG tablet Take 1 tablet (25 mg total) by mouth daily. 10/19/23   Love, Sharlet RAMAN, PA-C  tiZANidine  (ZANAFLEX ) 4 MG tablet Take 1 tablet (4 mg total) by mouth 2 (two) times daily. 10/19/23   Maurice Sharlet RAMAN, PA-C  UNITHROID  125 MCG tablet Take  1 tablet (125 mcg total) by mouth daily before breakfast. Patient taking differently: Take 125 mcg by mouth at bedtime. 09/09/23   Joshua Debby CROME, MD      Allergies    Amoxil  [amoxicillin ], Effexor [venlafaxine], and Prozac  [fluoxetine  hcl]    Review of Systems   Review of Systems  Musculoskeletal:  Positive for extremity weakness.  All other systems reviewed and are negative.   Physical Exam Updated Vital Signs BP (!) 146/96   Pulse (!) 120   Temp (!) 97.5 F (36.4 C)   Resp 16   Ht 5' 7 (1.702 m)   Wt 81.6 kg   SpO2 100%   BMI 28.19 kg/m  Physical Exam Vitals and  nursing note reviewed.  Constitutional:      General: He is not in acute distress.    Appearance: He is well-developed.  HENT:     Head: Normocephalic and atraumatic.  Eyes:     Conjunctiva/sclera: Conjunctivae normal.     Comments: Uneven pupils secondary to prior cataract surgeries.  Disconjugate gaze, chronic  Cardiovascular:     Rate and Rhythm: Tachycardia present.     Pulses: Normal pulses.     Heart sounds: Normal heart sounds.  Pulmonary:     Effort: Pulmonary effort is normal.     Breath sounds: Normal breath sounds.  Abdominal:     Palpations: Abdomen is soft.  Musculoskeletal:     Cervical back: Normal range of motion and neck supple.     Comments: Global weakness with exertional tremors to bilateral upper extremities and decreased grip strength bilaterally.  Coordination is poor.  Skin:    Findings: No rash.  Neurological:     Mental Status: He is alert.     Comments: Neurologic exam:  Speech clear, pupils unequal due to cataract surgery.  Dysconjugated gaze, chronic, extraocular movements intact  Normal peripheral visual fields Cranial nerves III through XII normal including no facial droop Follows commands, moves all extremities decreased grip strength bilaterally and 4 5 strength to bilateral upper extremities. Sensation diminished to bilateral face and bilateral forearms and hands Coordination poor No pronator drift Gait not tested      ED Results / Procedures / Treatments   Labs (all labs ordered are listed, but only abnormal results are displayed) Labs Reviewed  CBC WITH DIFFERENTIAL/PLATELET - Abnormal; Notable for the following components:      Result Value   WBC 11.1 (*)    Platelets 408 (*)    Eosinophils Absolute 0.7 (*)    All other components within normal limits  COMPREHENSIVE METABOLIC PANEL - Abnormal; Notable for the following components:   Sodium 130 (*)    Chloride 96 (*)    All other components within normal limits  RESP PANEL BY  RT-PCR (RSV, FLU A&B, COVID)  [id number]    EKG None  Radiology CT Head Wo Contrast Result Date: 10/25/2023 CLINICAL DATA:  MC-CTNeuro deficit, acute, stroke suspected EXAM: CT HEAD WITHOUT CONTRAST TECHNIQUE: Contiguous axial images were obtained from the base of the skull through the vertex without intravenous contrast. RADIATION DOSE REDUCTION: This exam was performed according to the departmental dose-optimization program which includes automated exposure control, adjustment of the mA and/or kV according to patient size and/or use of iterative reconstruction technique. COMPARISON:  None Available. FINDINGS: Brain: No acute intracranial hemorrhage. No focal mass lesion. No CT evidence of acute infarction. No midline shift or mass effect. No hydrocephalus. Basilar cisterns are patent. Vascular: No hyperdense vessel  or unexpected calcification. Skull: Normal. Negative for fracture or focal lesion. Sinuses/Orbits: Paranasal sinuses and mastoid air cells are clear. Orbits are clear. Other: None. IMPRESSION: No acute intracranial findings. Electronically Signed   By: Jackquline Boxer M.D.   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  Patient report he was discharged from the hospital 5 days ago.  He was doing better during his hospitalization.  However for the past 3 days he noticed progressive worsening weakness and muscle spasm involving both of his upper extremities as well as  muscle spasms throughout his body.  Spasm present at rest worsening with movement.  Last night he endorsed having tingling sensation to his cheeks that has now spread to both side of his face which concerns him.  He denies any significant headache, vision changes, trouble swallowing, breathing, trouble urinating or having abdominal pain.  He mention his symptoms felt similar to when he was hospitalized for GBS except it seems to be a bit more intense.  Exam notable for decreased sensation to bilateral side of face and cheek as well as decrease sensation to bilateral forearms and hands.  Decreased grip strength bilaterally, coordination is poor, patient exhibits intention tremors with exertion.  Unable to elicit patellar deep tendon reflex 2 bilateral lower extremities but patient able to flex at the hips bilaterally.  Given history of GBS previously and now worsening weakness and paresthesia.  Suspect possible recrudescence of GBS  with component of anxiety.  Will consult neurology for recommendation.  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  -Labs ordered, independently viewed and interpreted by me.  Labs remarkable for Na+ 130.  WBC 11.1 -The patient was maintained on a cardiac monitor.  I personally viewed and interpreted the cardiac monitored which showed an underlying rhythm of: sinus tachycardia -Imaging independently viewed and interpreted by me and I agree with radiologist's interpretation.  Result remarkable for head CT unremarkable -This patient presents to the ED for concern of tremors, this involves an extensive number of treatment options, and is a complaint that carries with it a high risk of  complications and morbidity.  The differential diagnosis includes recrudescence of GBS, anxiety, stroke, metabolic derangement, decondition -Co morbidities that complicate the patient evaluation includes GBS, depression, panic attacks, chronic pain -Treatment includes reassurance -Reevaluation of the patient after these medicines showed that the patient improved -PCP office notes or outside notes reviewed -Discussion with specialist on call neurologist Dr. Voncile who recommend NIF, if normal and no respiratory compromise pt to  f/u with Dr. Onita outpt.   -Escalation to admission/observation considered: patients feels much better, is comfortable with discharge, and will follow up with PCP -Prescription medication considered, patient comfortable with current home medications -Social Determinant of Health considered which includes stress and anxiety  4:39 PM Patient performed well in the NIF test.  He was able to ambulate while using his walker.  He did endorse having decreased grip strength to both hands but he does have PT OT set up outpatient.  Patient agreeable to reaching out to his neurologist tomorrow for outpatient follow-up.  He may benefit from further IVIG treatment outpatient as needed.  I gave patient strict return precaution.  Please note initially his heart rate was elevated at 120 this is likely secondary to stress/anxiety, on repeat heart rate normalized.        Final Clinical Impression(s) / ED Diagnoses Final diagnoses:  Weakness    Rx / DC Orders ED Discharge Orders     None         Nivia Colon, PA-C 10/25/23 1738    Dasie Faden, MD 10/26/23 1455

## 2023-10-25 NOTE — ED Notes (Signed)
Unable to get blood at

## 2023-10-25 NOTE — ED Triage Notes (Signed)
 Pt c.o left sided facial tingling up to his ear, right sided cheek tingling. Pt also c.o bilateral hand weakness. All his symptoms started last night. Pt recently diagnosed with GBS last month and received IVIG treatments.

## 2023-10-26 ENCOUNTER — Telehealth: Payer: Self-pay

## 2023-10-26 ENCOUNTER — Ambulatory Visit: Payer: 59

## 2023-10-26 ENCOUNTER — Encounter: Payer: Self-pay | Admitting: Internal Medicine

## 2023-10-26 ENCOUNTER — Telehealth: Payer: Self-pay | Admitting: Neurology

## 2023-10-26 ENCOUNTER — Other Ambulatory Visit: Payer: Self-pay | Admitting: Internal Medicine

## 2023-10-26 DIAGNOSIS — F410 Panic disorder [episodic paroxysmal anxiety] without agoraphobia: Secondary | ICD-10-CM

## 2023-10-26 MED ORDER — OXYCODONE-ACETAMINOPHEN 5-325 MG PO TABS: 1.0000 | ORAL_TABLET | Freq: Three times a day (TID) | ORAL | 0 refills | Status: DC | PRN

## 2023-10-26 NOTE — Telephone Encounter (Signed)
 Copied from CRM [id number]. Topic: General - Other >> Oct 26, 2023  4:04 PM Carmell SAUNDERS wrote: Reason for CRM: [facility] pre-reg dept called about the CT scheduled for the patient 10/28/23. Prior auth expired 10/22/23. Please contact Lolita asap at [phone] ext [phone]

## 2023-10-26 NOTE — Telephone Encounter (Signed)
 Move up his follow up to Jan 16th at 1pm

## 2023-10-26 NOTE — Telephone Encounter (Signed)
 Late entry: Patient called after-hours call service on 10/25/2023 reporting weakness, numbness and tingling going up the left side of his face and difficulty moving his left hand.  I was able to talk to patient on the phone.  He reported that he was recently diagnosed with Gilbert's syndrome.  He was recently discharged from the hospital and reported worsening symptoms including facial paresthesias as well as difficulty moving his ring finger on the left side.  He was able to talk in full sentences without any audible evidence of dyspnea or dysarthria.  Nevertheless, he was advised to proceed to the emergency room because of his neurovascular disorder.  He was in agreement.  Upon chart review, he was evaluated in the emergency room and discharged from the ED with recommendation of follow-up outpatient.  He was able to ambulate with walker and performed well on NIF test.  He has an appointment pending with Dr. Onita for next week. Dr. Onita, please review and make additional recommendations as necessary.

## 2023-10-26 NOTE — Telephone Encounter (Signed)
 Noted and will wait for Dr. Terrace Arabia recommendations

## 2023-10-27 ENCOUNTER — Telehealth (HOSPITAL_COMMUNITY): Payer: Self-pay | Admitting: Emergency Medicine

## 2023-10-27 ENCOUNTER — Encounter: Payer: Self-pay | Admitting: *Deleted

## 2023-10-27 NOTE — Telephone Encounter (Signed)
 Attempted to call patient regarding upcoming cardiac CT appointment. Left message on voicemail with name and callback number Rockwell Alexandria RN Navigator Cardiac Imaging [facility] [phone] Office [phone] Cell

## 2023-10-28 ENCOUNTER — Ambulatory Visit (HOSPITAL_COMMUNITY): Admit: 2023-10-28 | Payer: 59

## 2023-10-29 ENCOUNTER — Other Ambulatory Visit: Payer: Self-pay

## 2023-10-29 ENCOUNTER — Encounter (HOSPITAL_COMMUNITY): Payer: Self-pay

## 2023-10-29 ENCOUNTER — Ambulatory Visit: Payer: 59 | Admitting: Physical Therapy

## 2023-10-29 ENCOUNTER — Inpatient Hospital Stay (HOSPITAL_COMMUNITY)
Admission: EM | Admit: 2023-10-29 | Discharge: 2023-11-10 | DRG: 074 | Disposition: A | Discharge status: Rehabilitation Facility | Payer: 59 | Attending: Internal Medicine | Admitting: Internal Medicine

## 2023-10-29 ENCOUNTER — Emergency Department (HOSPITAL_COMMUNITY): Payer: 59

## 2023-10-29 ENCOUNTER — Ambulatory Visit: Payer: 59 | Admitting: Occupational Therapy

## 2023-10-29 ENCOUNTER — Encounter: Payer: Self-pay | Admitting: Neurology

## 2023-10-29 ENCOUNTER — Ambulatory Visit: Payer: 59 | Admitting: Neurology

## 2023-10-29 VITALS — BP 99/67 | HR 114 | Ht 67.0 in | Wt 179.9 lb

## 2023-10-29 DIAGNOSIS — R12 Heartburn: Secondary | ICD-10-CM | POA: Diagnosis not present

## 2023-10-29 DIAGNOSIS — Z751 Person awaiting admission to adequate facility elsewhere: Secondary | ICD-10-CM

## 2023-10-29 DIAGNOSIS — T380X5D Adverse effect of glucocorticoids and synthetic analogues, subsequent encounter: Secondary | ICD-10-CM | POA: Diagnosis not present

## 2023-10-29 DIAGNOSIS — G629 Polyneuropathy, unspecified: Secondary | ICD-10-CM | POA: Diagnosis not present

## 2023-10-29 DIAGNOSIS — Z83438 Family history of other disorder of lipoprotein metabolism and other lipidemia: Secondary | ICD-10-CM | POA: Diagnosis not present

## 2023-10-29 DIAGNOSIS — Z8249 Family history of ischemic heart disease and other diseases of the circulatory system: Secondary | ICD-10-CM | POA: Diagnosis not present

## 2023-10-29 DIAGNOSIS — D72829 Elevated white blood cell count, unspecified: Secondary | ICD-10-CM | POA: Diagnosis present

## 2023-10-29 DIAGNOSIS — E785 Hyperlipidemia, unspecified: Secondary | ICD-10-CM | POA: Diagnosis present

## 2023-10-29 DIAGNOSIS — Z79899 Other long term (current) drug therapy: Secondary | ICD-10-CM

## 2023-10-29 DIAGNOSIS — E876 Hypokalemia: Secondary | ICD-10-CM | POA: Diagnosis present

## 2023-10-29 DIAGNOSIS — R269 Unspecified abnormalities of gait and mobility: Secondary | ICD-10-CM

## 2023-10-29 DIAGNOSIS — G894 Chronic pain syndrome: Secondary | ICD-10-CM | POA: Diagnosis present

## 2023-10-29 DIAGNOSIS — Z888 Allergy status to other drugs, medicaments and biological substances status: Secondary | ICD-10-CM | POA: Diagnosis not present

## 2023-10-29 DIAGNOSIS — Z833 Family history of diabetes mellitus: Secondary | ICD-10-CM | POA: Diagnosis not present

## 2023-10-29 DIAGNOSIS — F411 Generalized anxiety disorder: Secondary | ICD-10-CM | POA: Diagnosis not present

## 2023-10-29 DIAGNOSIS — E039 Hypothyroidism, unspecified: Secondary | ICD-10-CM | POA: Diagnosis present

## 2023-10-29 DIAGNOSIS — F32A Depression, unspecified: Secondary | ICD-10-CM | POA: Diagnosis present

## 2023-10-29 DIAGNOSIS — G6181 Chronic inflammatory demyelinating polyneuritis: Secondary | ICD-10-CM

## 2023-10-29 DIAGNOSIS — E860 Dehydration: Secondary | ICD-10-CM | POA: Diagnosis present

## 2023-10-29 DIAGNOSIS — F419 Anxiety disorder, unspecified: Secondary | ICD-10-CM | POA: Diagnosis not present

## 2023-10-29 DIAGNOSIS — Z803 Family history of malignant neoplasm of breast: Secondary | ICD-10-CM | POA: Diagnosis not present

## 2023-10-29 DIAGNOSIS — Z7982 Long term (current) use of aspirin: Secondary | ICD-10-CM | POA: Diagnosis not present

## 2023-10-29 DIAGNOSIS — Z720 Tobacco use: Secondary | ICD-10-CM

## 2023-10-29 DIAGNOSIS — F410 Panic disorder [episodic paroxysmal anxiety] without agoraphobia: Secondary | ICD-10-CM | POA: Diagnosis present

## 2023-10-29 DIAGNOSIS — G8250 Quadriplegia, unspecified: Secondary | ICD-10-CM | POA: Diagnosis not present

## 2023-10-29 DIAGNOSIS — F54 Psychological and behavioral factors associated with disorders or diseases classified elsewhere: Secondary | ICD-10-CM | POA: Diagnosis not present

## 2023-10-29 DIAGNOSIS — Z880 Allergy status to penicillin: Secondary | ICD-10-CM | POA: Diagnosis not present

## 2023-10-29 DIAGNOSIS — Z825 Family history of asthma and other chronic lower respiratory diseases: Secondary | ICD-10-CM

## 2023-10-29 DIAGNOSIS — E871 Hypo-osmolality and hyponatremia: Secondary | ICD-10-CM | POA: Diagnosis present

## 2023-10-29 DIAGNOSIS — R7401 Elevation of levels of liver transaminase levels: Secondary | ICD-10-CM | POA: Diagnosis not present

## 2023-10-29 DIAGNOSIS — R209 Unspecified disturbances of skin sensation: Secondary | ICD-10-CM | POA: Diagnosis not present

## 2023-10-29 DIAGNOSIS — I10 Essential (primary) hypertension: Secondary | ICD-10-CM | POA: Diagnosis present

## 2023-10-29 DIAGNOSIS — K5900 Constipation, unspecified: Secondary | ICD-10-CM | POA: Diagnosis not present

## 2023-10-29 DIAGNOSIS — Z0289 Encounter for other administrative examinations: Secondary | ICD-10-CM

## 2023-10-29 HISTORY — PX: IR FLUORO GUIDE CV LINE RIGHT: IMG2283

## 2023-10-29 HISTORY — PX: IR US GUIDE VASC ACCESS RIGHT: IMG2390

## 2023-10-29 LAB — BASIC METABOLIC PANEL
Anion gap: 14 (ref 5–15)
Anion gap: 11 (ref 5–15)
BUN: 22 mg/dL — ABNORMAL HIGH (ref 6–20)
BUN: 21 mg/dL — ABNORMAL HIGH (ref 6–20)
CO2: 21 mmol/L — ABNORMAL LOW (ref 22–32)
CO2: 24 mmol/L (ref 22–32)
Calcium: 9.8 mg/dL (ref 8.9–10.3)
Calcium: 9.1 mg/dL (ref 8.9–10.3)
Chloride: 92 mmol/L — ABNORMAL LOW (ref 98–111)
Chloride: 93 mmol/L — ABNORMAL LOW (ref 98–111)
Creatinine, Ser: 0.88 mg/dL (ref 0.61–1.24)
Creatinine, Ser: 0.82 mg/dL (ref 0.61–1.24)
GFR, Estimated: >60 mL/min (ref >60)
GFR, Estimated: >60 mL/min (ref >60)
Glucose, Bld: 93 mg/dL (ref 70–99)
Glucose, Bld: 122 mg/dL — ABNORMAL HIGH (ref 70–99)
Potassium: 4 mmol/L (ref 3.5–5.1)
Potassium: 3.1 mmol/L — ABNORMAL LOW (ref 3.5–5.1)
Sodium: 127 mmol/L — ABNORMAL LOW (ref 135–145)
Sodium: 128 mmol/L — ABNORMAL LOW (ref 135–145)

## 2023-10-29 LAB — CBC WITH DIFFERENTIAL/PLATELET
Abs Immature Granulocytes: 0.05 10*3/uL (ref 0.00–0.07)
Basophils Absolute: 0.2 10*3/uL — ABNORMAL HIGH (ref 0.0–0.1)
Basophils Relative: 1 %
Eosinophils Absolute: 0.9 10*3/uL — ABNORMAL HIGH (ref 0.0–0.5)
Eosinophils Relative: 7 %
HCT: 46 % (ref 39.0–52.0)
Hemoglobin: 16.1 g/dL (ref 13.0–17.0)
Immature Granulocytes: 0 %
Lymphocytes Relative: 25 %
Lymphs Abs: 3.2 10*3/uL (ref 0.7–4.0)
MCH: 31 pg (ref 26.0–34.0)
MCHC: 35 g/dL (ref 30.0–36.0)
MCV: 88.5 fL (ref 80.0–100.0)
Monocytes Absolute: 1.1 10*3/uL — ABNORMAL HIGH (ref 0.1–1.0)
Monocytes Relative: 9 %
Neutro Abs: 7.4 10*3/uL (ref 1.7–7.7)
Neutrophils Relative %: 58 %
Platelets: 425 10*3/uL — ABNORMAL HIGH (ref 150–400)
RBC: 5.2 MIL/uL (ref 4.22–5.81)
RDW: 12.4 % (ref 11.5–15.5)
WBC: 12.8 10*3/uL — ABNORMAL HIGH (ref 4.0–10.5)
nRBC: 0 % (ref 0.0–0.2)

## 2023-10-29 LAB — OSMOLALITY: Osmolality: 279 mosm/kg (ref 275–295)

## 2023-10-29 MED ORDER — GABAPENTIN 300 MG PO CAPS
900.0000 mg | ORAL_CAPSULE | Freq: Three times a day (TID) | ORAL | Status: DC
  Administered 2023-10-29 – 2023-11-10 (×36): 900 mg via ORAL
  Filled 2023-10-29 (×33): qty 3
  Filled 2023-10-29: qty 9
  Filled 2023-10-29 (×2): qty 3

## 2023-10-29 MED ORDER — GABAPENTIN 300 MG PO CAPS: 900.0000 mg | ORAL_CAPSULE | Freq: Three times a day (TID) | ORAL | 11 refills | Status: DC

## 2023-10-29 MED ORDER — OXCARBAZEPINE 300 MG PO TABS: 300.0000 mg | ORAL_TABLET | Freq: Two times a day (BID) | ORAL | 11 refills | Status: DC

## 2023-10-29 MED ORDER — HEPARIN SODIUM (PORCINE) 1000 UNIT/ML IJ SOLN
INTRAMUSCULAR | Status: AC
  Filled 2023-10-29: qty 10

## 2023-10-29 MED ORDER — LIDOCAINE-EPINEPHRINE 1 %-1:100000 IJ SOLN
INTRAMUSCULAR | Status: AC
  Filled 2023-10-29: qty 1

## 2023-10-29 MED ORDER — PREDNISONE 10 MG PO TABS: 60.0000 mg | ORAL_TABLET | Freq: Every day | ORAL | 6 refills | Status: DC

## 2023-10-29 MED ORDER — DULOXETINE HCL 60 MG PO CPEP: 60.0000 mg | ORAL_CAPSULE | Freq: Every day | ORAL | 11 refills | Status: DC

## 2023-10-29 MED ORDER — OXCARBAZEPINE 300 MG PO TABS
300.0000 mg | ORAL_TABLET | Freq: Two times a day (BID) | ORAL | Status: DC
Start: 2023-10-29 — End: 2023-11-03
  Administered 2023-10-29 – 2023-11-02 (×9): 300 mg via ORAL
  Filled 2023-10-29 (×10): qty 1

## 2023-10-29 MED ORDER — LEVOTHYROXINE SODIUM 25 MCG PO TABS
125.0000 ug | ORAL_TABLET | Freq: Every day | ORAL | Status: DC
  Administered 2023-10-30: 125 ug via ORAL
  Filled 2023-10-29: qty 1

## 2023-10-29 MED ORDER — NICOTINE POLACRILEX 2 MG MT GUM
2.0000 mg | CHEWING_GUM | OROMUCOSAL | Status: DC | PRN
  Administered 2023-10-30 (×2): 2 mg via ORAL
  Filled 2023-10-29 (×4): qty 1

## 2023-10-29 MED ORDER — HYDROCODONE-ACETAMINOPHEN 5-325 MG PO TABS
1.0000 | ORAL_TABLET | Freq: Once | ORAL | Status: AC
  Administered 2023-10-29: 1 via ORAL
  Filled 2023-10-29: qty 1

## 2023-10-29 MED ORDER — ALBUTEROL SULFATE (2.5 MG/3ML) 0.083% IN NEBU
2.5000 mg | INHALATION_SOLUTION | Freq: Four times a day (QID) | RESPIRATORY_TRACT | Status: DC | PRN
  Administered 2023-10-30 – 2023-11-04 (×2): 2.5 mg via RESPIRATORY_TRACT
  Filled 2023-10-29 (×2): qty 3

## 2023-10-29 MED ORDER — ACETAMINOPHEN 650 MG RE SUPP: 650.0000 mg | Freq: Four times a day (QID) | RECTAL | Status: DC | PRN

## 2023-10-29 MED ORDER — DULOXETINE HCL 60 MG PO CPEP
60.0000 mg | ORAL_CAPSULE | Freq: Every day | ORAL | Status: DC
  Administered 2023-10-30 – 2023-11-10 (×12): 60 mg via ORAL
  Filled 2023-10-29 (×12): qty 1

## 2023-10-29 MED ORDER — SODIUM CHLORIDE 0.9% FLUSH
3.0000 mL | Freq: Two times a day (BID) | INTRAVENOUS | Status: DC
  Administered 2023-10-29 – 2023-11-06 (×15): 3 mL via INTRAVENOUS

## 2023-10-29 MED ORDER — POLYETHYLENE GLYCOL 3350 17 G PO PACK: 17.0000 g | PACK | Freq: Every day | ORAL | Status: DC | PRN

## 2023-10-29 MED ORDER — ENOXAPARIN SODIUM 40 MG/0.4ML IJ SOSY
40.0000 mg | PREFILLED_SYRINGE | INTRAMUSCULAR | Status: DC
  Administered 2023-10-29 – 2023-11-09 (×12): 40 mg via SUBCUTANEOUS
  Filled 2023-10-29 (×12): qty 0.4

## 2023-10-29 MED ORDER — METOPROLOL SUCCINATE ER 25 MG PO TB24
25.0000 mg | ORAL_TABLET | Freq: Every day | ORAL | Status: DC
  Administered 2023-10-30 – 2023-11-10 (×12): 25 mg via ORAL
  Filled 2023-10-29 (×12): qty 1

## 2023-10-29 MED ORDER — SERTRALINE HCL 50 MG PO TABS
25.0000 mg | ORAL_TABLET | Freq: Every day | ORAL | Status: DC
  Administered 2023-10-30 – 2023-11-10 (×12): 25 mg via ORAL
  Filled 2023-10-29 (×12): qty 1

## 2023-10-29 MED ORDER — ACETAMINOPHEN 325 MG PO TABS
650.0000 mg | ORAL_TABLET | Freq: Four times a day (QID) | ORAL | Status: DC | PRN
Start: 2023-10-29 — End: 2023-11-10
  Administered 2023-10-30 – 2023-11-04 (×2): 650 mg via ORAL
  Filled 2023-10-29 (×6): qty 2

## 2023-10-29 MED ORDER — SODIUM CHLORIDE 0.9 % IV SOLN: INTRAVENOUS | Status: AC

## 2023-10-29 MED ORDER — OXYCODONE-ACETAMINOPHEN 5-325 MG PO TABS
1.0000 | ORAL_TABLET | Freq: Three times a day (TID) | ORAL | Status: DC | PRN
  Administered 2023-10-29 – 2023-11-01 (×9): 1 via ORAL
  Filled 2023-10-29 (×10): qty 1

## 2023-10-29 MED ORDER — IRBESARTAN 300 MG PO TABS
300.0000 mg | ORAL_TABLET | Freq: Every day | ORAL | Status: DC
  Administered 2023-10-30 – 2023-11-10 (×12): 300 mg via ORAL
  Filled 2023-10-29 (×12): qty 1

## 2023-10-29 MED ORDER — ORAL CARE MOUTH RINSE: 15.0000 mL | OROMUCOSAL | Status: DC | PRN

## 2023-10-29 MED ORDER — CLONAZEPAM 0.5 MG PO TABS
0.5000 mg | ORAL_TABLET | Freq: Two times a day (BID) | ORAL | Status: DC | PRN
  Administered 2023-10-29 – 2023-11-10 (×22): 0.5 mg via ORAL
  Filled 2023-10-29 (×22): qty 1

## 2023-10-29 NOTE — Progress Notes (Signed)
ASSESSMENT AND PLAN  Jonathan Luna is a 58 y.o. 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  MRI of neural axis, showed no findings to explain above difficulties,  Mild improvement with IVIG treatment in Dec, 2024, now progressively worse, with worsening gait abnormality, distal weakness,   Discussed with patient, with his worsening symptoms, shortness of breath, will sent him to hospital for PLEX, discussed treatment plan with neurohospitalist Dr. Amada Jupiter, may add on prednisone.  Initial Out patient IVIG 2g/kg loading, followed by 1g/kg every 3 weeks. Neuropathic pain:  Suboptimal response with gabapentin 600 mg, 4 times a day, may consider higher dose, add on Cymbalta Trileptal      DIAGNOSTIC DATA (LABS, IMAGING, TESTING) - I reviewed patient records, labs, notes, testing and imaging myself where available.   MEDICAL HISTORY:  Jonathan Luna is a 58 year old male accompanied by his wife, seen in request by his primary care doctor Sanda Linger for evaluation of rapid onset gait abnormality, initial evaluation September 22, 2023  History is obtained from the patient and review of electronic medical records. I personally reviewed pertinent available imaging films in PACS.   PMHx of  Depression, Anxiety Chronic low back pain, on percocet 5/325 tid by Dr. Yetta Barre HTN Pneumonia Hereditary cataract,  Left retinal detachment,  Right blood vessel   He suffered upper respiratory symptoms, pneumonia, was treated with antibiotic in the middle of October 2024.  Azithromycin, subsequently Augmentin  Few days later, he began to noticed numbness tingling starting at the bottom of his feet, toes, also burning  sensation, by August 10, 2023, he noticed mild bilateral hands paresthesia, then had gradual onset gait abnormality, by November 11, when he walked to the car, he noticed unsteady gait,  His gait abnormality continue to getting worse, fell few times, lower extremity buckled underneath him, then around November 15, he began to have upper extremity symptoms, feel weak, shaky when holding object, shallow breathing, out of breath easily  She presented to emergency room August 28, 2023, heart rate documented 127, temperature of 101, blood pressure 157/100, was diagnosed with sepsis due to right upper lobe pneumonia, D-dimer was positive, PE protocol was negative, which CT chest showed posterior right upper lobe airspace and groundglass opacity, respiratory virus panel was negative  He was treated with IV ceftriaxone, azithromycin, followed by p.o. doxycycline, cefdinir, his upper respiratory symptoms has much improved, however, since discharge, he had persistent gait abnormality, weird achy pain involving upper and lower extremity, hypersensitivity of the neck running up and down his spine, twitching inside his body,  He drove himself, shop at [facility] on September 04, 2023, felt such gait abnormality, stiff weak lower extremity, shortness of breath, by the time he left [employer], he have to rely on a cart, to pull himself, see since then he relied on his wheelchair, has difficulty bearing weight  UPDATE September 30, 2023: He continues to have profound gait abnormality, no improvement  MRI of the brain  showed no significant abnormality  MRI of cervical and thoracic spine showed mild degenerative changes, no cord or nerve root compression, MRI lumbar spine showed prominent disc and facet degenerative changes, throughout, most prominent L4-5, moderate right, mild foraminal narrowing  Extensive laboratory evaluations showed elevated TSH, otherwise normal  negative HIV, RPR, vitamin D, ANA A1c ESR C-reactive  protein Lyme titer, vitamin D, protein electrophoresis, Lyme titer,  UPDATE Jan 16th 2025: He was admitted following last visit on September 30, 2023, received IVIG treatment, completed on October 04, 2023, reported mild improvement, was able to walk better, followed by inpatient rehabilitation, Lumbar puncture October 12, 2023, total protein of 139, WBC of 12, 84% lymphocyte, monocyte 14%  MRI of the brain December 19 was normal,  MRI of lumbar: Lower lumbar degenerative changes most noticeable L4-5, no canal stenosis moderate foraminal narrowing  MRI of thoracic spine only mild degenerative changes,  MRI of cervical spine, mild degenerative changes  But benefit from the IVIG from December only last for 3 weeks, over the past few weeks, he complains slow decline in functional status, now with profound bilateral upper and lower extremity weakness increased gait abnormality, shortness of breath with minimum exertion, was not able to continue his home PT,  Also complains of worsening neuropathic pain taking gabapentin 2400 mg daily,  Recent labs showed hyponatremia sodium 130, drink large quantity of free water at home PHYSICAL EXAM:      10/29/2023   12:20 PM 10/25/2023    5:42 PM 10/25/2023    5:00 PM  Vitals with BMI  Height 5\' 7"     Weight 179 lbs 14 oz    BMI 28.17    Systolic 99 139 163  Diastolic 67 94 97  Pulse 114 88 89     PHYSICAL EXAMNIATION:  Gen: NAD, conversant, well nourised, well groomed                     Cardiovascular: Regular rate rhythm, no peripheral edema, warm, nontender. Eyes: Conjunctivae clear without exudates or hemorrhage Neck: Supple, no carotid bruits. Pulmonary: Clear to auscultation bilaterally   NEUROLOGICAL EXAM:  MENTAL STATUS: Speech/cognition: Awake, alert, oriented to history taking and casual conversation CRANIAL NERVES: CN II: Visual fields are full to confrontation.  Postsurgical irregular pupil  CN III, IV, VI: extraocular  movement are normal. No ptosis. CN V: Facial sensation is intact to light touch CN VII: Face is symmetric with normal eye closure  CN VIII: Hearing is normal to causal conversation. CN IX, X: Phonation is normal. CN XI: Head turning and shoulder shrug are intact  MOTOR: Moderate bilateral upper extremity proximal weakness, profound distal weakness, barely antigravity, mild bilateral hip flexion weakness, moderate to severe bilateral ankle dorsiflexion, plantarflexion weakness,  REFLEXES: Areflexia  SENSORY: Well-preserved toe vibratory sensation, proprioception, pinprick,    COORDINATION: Moderate truncal ataxia, mild to moderate bilateral finger-to-nose heel-to-shin dysmetria  GAIT/STANCE: Need push-up to get up from seated position, wide-based unsteady, ataxic gait REVIEW OF SYSTEMS:  Full 14 system review of systems performed and notable only for as above All other review of systems were negative.   ALLERGIES: Allergies  Allergen Reactions   Amoxil [Amoxicillin] Other (See Comments)    Pt states that it does not work for him when he had back to back pneumonia   Effexor [Venlafaxine] Diarrhea   Prozac [Fluoxetine Hcl] Diarrhea    HOME MEDICATIONS: Current Outpatient Medications  Medication Sig Dispense Refill   albuterol (VENTOLIN HFA) 108 (90 Base) MCG/ACT inhaler Inhale 1-2 puffs into the lungs every 6 (six) hours as needed for wheezing or shortness of breath. 1 each 0   aspirin EC 81 MG tablet Take 81 mg by mouth daily. Swallow whole.     Cholecalciferol (VITAMIN D-3) 25 MCG (1000  UT) CAPS Take 1 capsule by mouth daily.     clonazePAM (KLONOPIN) 0.5 MG tablet Take 1 tablet (0.5 mg total) by mouth 2 (two) times daily as needed for anxiety. 15 tablet 0   DULoxetine (CYMBALTA) 60 MG capsule Take 1 capsule (60 mg total) by mouth at bedtime. 30 capsule 0   fluticasone (FLONASE) 50 MCG/ACT nasal spray Place 2 sprays into both nostrils daily. 48 g 1   gabapentin (NEURONTIN) 300  MG capsule Take 2 capsules (600 mg total) by mouth 4 (four) times daily. 240 capsule 0   guaiFENesin (MUCINEX) 600 MG 12 hr tablet Take 1 tablet (600 mg total) by mouth 2 (two) times daily. 60 tablet 0   irbesartan (AVAPRO) 300 MG tablet Take 1 tablet (300 mg total) by mouth daily. 30 tablet 0   oxyCODONE-acetaminophen (PERCOCET/ROXICET) 5-325 MG tablet Take 1 tablet by mouth every 8 (eight) hours as needed for severe pain (pain score 7-10). 90 tablet 0   sertraline (ZOLOFT) 25 MG tablet Take 1 tablet (25 mg total) by mouth daily. 30 tablet 0   tiZANidine (ZANAFLEX) 4 MG tablet Take 1 tablet (4 mg total) by mouth 2 (two) times daily. 60 tablet 0   UNITHROID 125 MCG tablet Take 1 tablet (125 mcg total) by mouth daily before breakfast. (Patient taking differently: Take 125 mcg by mouth at bedtime.) 90 tablet 0   metoprolol succinate (TOPROL XL) 25 MG 24 hr tablet Take 1 tablet (25 mg total) by mouth daily. 30 tablet 0   No current facility-administered medications for this visit.    PAST MEDICAL HISTORY: Past Medical History:  Diagnosis Date   Allergy    Anxiety    Arthritis    Cataract    Community acquired pneumonia 08/28/2023   Depression    Detached retina    Heart murmur    History of bilateral cataract extraction    Hyperlipidemia    Hypothyroidism    Low back pain    Panic attack    Panic attacks     PAST SURGICAL HISTORY: Past Surgical History:  Procedure Laterality Date   CATARACT EXTRACTION     EYE SURGERY      FAMILY HISTORY: Family History  Problem Relation Age of Onset   Breast cancer Mother    Hypertension Father    Heart disease Father    Asthma Father    Emphysema Father        smoker for 45 years   Diabetes Brother    Hyperlipidemia Brother     SOCIAL HISTORY: Social History   Socioeconomic History   Marital status: Married    Spouse name: Not on file   Number of children: 0   Years of education: Not on file   Highest education level: Not on  file  Occupational History   Occupation: Cumputer operator/security clearance [employer]  Tobacco Use   Smoking status: Never    Passive exposure: Past   Smokeless tobacco: Never   Tobacco comments:    As a teenager  Vaping Use   Vaping status: Never Used  Substance and Sexual Activity   Alcohol use: Not Currently   Drug use: No   Sexual activity: Yes    Birth control/protection: Condom  Other Topics Concern   Not on file  Social History Narrative   Caffienated drinks-no   Seat belt use often-yes   Regular Exercise-yes   Smoke alarm in the home-yes   Firearms/guns in the home-no  History of physical abuse-no               Social Drivers of Health   Financial Resource Strain: Patient Declined (05/27/2023)   Overall Financial Resource Strain (CARDIA)    Difficulty of Paying Living Expenses: Patient declined  Food Insecurity: No Food Insecurity (10/01/2023)   Hunger Vital Sign    Worried About Running Out of Food in the Last Year: Never true    Ran Out of Food in the Last Year: Never true  Transportation Needs: No Transportation Needs (10/01/2023)   PRAPARE - Administrator, Civil Service (Medical): No    Lack of Transportation (Non-Medical): No  Physical Activity: Insufficiently Active (05/27/2023)   Exercise Vital Sign    Days of Exercise per Week: 4 days    Minutes of Exercise per Session: 30 min  Stress: Stress Concern Present (05/27/2023)   [employer] of Occupational Health - Occupational Stress Questionnaire    Feeling of Stress : Rather much  Social Connections: Unknown (05/27/2023)   Social Connection and Isolation Panel [NHANES]    Frequency of Communication with Friends and Family: Patient declined    Frequency of Social Gatherings with Friends and Family: Once a week    Attends Religious Services: More than 4 times per year    Active Member of [employer] or Organizations: Yes    Attends Banker Meetings: More than 4 times per  year    Marital Status: Patient declined  Intimate Partner Violence: Not At Risk (10/01/2023)   Humiliation, Afraid, Rape, and Kick questionnaire    Fear of Current or Ex-Partner: No    Emotionally Abused: No    Physically Abused: No    Sexually Abused: No      Levert Feinstein, M.D. Ph.D.  [facility] [address] [place], [place] [address] Ph: [phone] Fax: [phone]  CC:  Jacquelynn Cree, PA-C [address] [place],  [place] [address]  [name], MD

## 2023-10-29 NOTE — Progress Notes (Signed)
Informed by RN that patient does not smoke cigarettes but chronically uses nicotine pouches at home, reportedly each pouch is 6 mg in dose and he uses 1 every 3-4 hours.  Patient is getting restless and agitated, repeatedly requesting to use nicotine pouches.  I have discussed with pharmacist and ordered nicotine gum as needed.

## 2023-10-29 NOTE — Consult Note (Addendum)
NEUROLOGY CONSULT NOTE   Date of service: October 29, 2023 Patient Name: Jonathan Luna MRN:  [id number] DOB:  [birth year] Chief Complaint: "CIDP" Requesting Provider: No att. providers found  History of Present Illness  Jonathan Luna is a 58 y.o. male  has a past medical history of Allergy, Anxiety, Arthritis, Cataract, Community acquired pneumonia (08/28/2023), Depression, Detached retina, Heart murmur, History of bilateral cataract extraction, Hyperlipidemia, Hypothyroidism, Low back pain, Panic attack, and Panic attacks. Recent hospitalization and rehab stay for AIDP, discharged from rehab on 10/20/23 who presents to [facility] ED via EMS from neurology office at the request of Dr. Terrace Arabia for admission and PLEX treatments.   In October he developed an upper respiratory infection/PNA followed by numbness, burning and tingling in bilateral feet with progression to bilateral hands. In December he had an EMG nerve conduction study which confirmed AIDP, and LP which revealed CSF protein of 139. He was admitted to the hospital and completed a 5 day course of IVIG, with noted improvement in strength but did not get back to baseline. He was admitted to rehab on 10/05/23 and discharged on 10/20/2023.   He states since discharge from rehab he has progressively gotten weaker, with worsening gait instability and bilateral arm weakness. He states he was able to walk better and do some things for himself and now he is not able to do things due to the worsening weakness. He also states he has developed numbness and tingling on his face bilaterally and has paresthesia on bilateral arms and legs. He also has complaints of muscle cramps in bilateral calves. He was seen in the ED on 10/25/23 worsening weakness and tingling. He had an appointment with Dr. Terrace Arabia today who recommended he come to the hospital for admission and PLEX treatments    ROS  Comprehensive ROS performed and pertinent positives documented in HPI    Past History    Past Medical History:  Diagnosis Date   Allergy    Anxiety    Arthritis    Cataract    Community acquired pneumonia 08/28/2023   Depression    Detached retina    Heart murmur    History of bilateral cataract extraction    Hyperlipidemia    Hypothyroidism    Low back pain    Panic attack    Panic attacks     Past Surgical History:  Procedure Laterality Date   CATARACT EXTRACTION     EYE SURGERY      Family History: Family History  Problem Relation Age of Onset   Breast cancer Mother    Hypertension Father    Heart disease Father    Asthma Father    Emphysema Father        smoker for 45 years   Diabetes Brother    Hyperlipidemia Brother     Social History  reports that he has never smoked. He has been exposed to tobacco smoke. He has never used smokeless tobacco. He reports that he does not currently use alcohol. He reports that he does not use drugs.  Allergies  Allergen Reactions   Amoxil [Amoxicillin] Other (See Comments)    Pt states that it does not work for him when he had back to back pneumonia   Effexor [Venlafaxine] Diarrhea   Prozac [Fluoxetine Hcl] Diarrhea    Medications  No current facility-administered medications for this encounter.  Current Outpatient Medications:    albuterol (VENTOLIN HFA) 108 (90 Base) MCG/ACT inhaler, Inhale 1-2 puffs into the  lungs every 6 (six) hours as needed for wheezing or shortness of breath., Disp: 1 each, Rfl: 0   aspirin EC 81 MG tablet, Take 81 mg by mouth daily. Swallow whole., Disp: , Rfl:    Cholecalciferol (VITAMIN D-3) 25 MCG (1000 UT) CAPS, Take 1 capsule by mouth daily., Disp: , Rfl:    clonazePAM (KLONOPIN) 0.5 MG tablet, Take 1 tablet (0.5 mg total) by mouth 2 (two) times daily as needed for anxiety., Disp: 15 tablet, Rfl: 0   DULoxetine (CYMBALTA) 60 MG capsule, Take 1 capsule (60 mg total) by mouth daily., Disp: 30 capsule, Rfl: 11   fluticasone (FLONASE) 50 MCG/ACT nasal spray, Place 2 sprays into  both nostrils daily., Disp: 48 g, Rfl: 1   gabapentin (NEURONTIN) 300 MG capsule, Take 3 capsules (900 mg total) by mouth 3 (three) times daily., Disp: 270 capsule, Rfl: 11   irbesartan (AVAPRO) 300 MG tablet, Take 1 tablet (300 mg total) by mouth daily., Disp: 30 tablet, Rfl: 0   metoprolol succinate (TOPROL XL) 25 MG 24 hr tablet, Take 1 tablet (25 mg total) by mouth daily., Disp: 30 tablet, Rfl: 0   Oxcarbazepine (TRILEPTAL) 300 MG tablet, Take 1 tablet (300 mg total) by mouth 2 (two) times daily., Disp: 60 tablet, Rfl: 11   oxyCODONE-acetaminophen (PERCOCET/ROXICET) 5-325 MG tablet, Take 1 tablet by mouth every 8 (eight) hours as needed for severe pain (pain score 7-10)., Disp: 90 tablet, Rfl: 0   predniSONE (DELTASONE) 10 MG tablet, Take 6 tablets (60 mg total) by mouth daily with breakfast., Disp: 180 tablet, Rfl: 6   sertraline (ZOLOFT) 25 MG tablet, Take 1 tablet (25 mg total) by mouth daily., Disp: 30 tablet, Rfl: 0   tiZANidine (ZANAFLEX) 4 MG tablet, Take 1 tablet (4 mg total) by mouth 2 (two) times daily., Disp: 60 tablet, Rfl: 0   UNITHROID 125 MCG tablet, Take 1 tablet (125 mcg total) by mouth daily before breakfast. (Patient taking differently: Take 125 mcg by mouth at bedtime.), Disp: 90 tablet, Rfl: 0  Vitals   Vitals:   10/29/23 1417  BP: (!) 117/91  Pulse: (!) 106  Resp: 16  Temp: 97.9 F (36.6 C)  TempSrc: Oral  SpO2: 96%    There is no height or weight on file to calculate BMI.  Physical Exam   Constitutional: critically ill  Psych: Affect appropriate to situation.   Eyes: No scleral injection.   HENT: No OP obstruction.   Head: Normocephalic.   Cardiovascular: Normal rate and regular rhythm.   Respiratory: Effort normal, non-labored breathing.   GI: Soft.  No distension. There is no tenderness.   Skin: WDI.    Neurologic Examination    Mental Status -  Alert and oriented x4. Able to give coherent history and follow commands  Cranial Nerves II - XII  - II - Visual field intact OU . Post surgical pupil changes III, IV, VI - Extraocular movements intact . V - tingling on bilateral face  VII - Facial movement intact bilaterally . VIII - Hearing & vestibular intact bilaterally . X - Palate elevates symmetrically . XI - Chin turning & shoulder shrug intact bilaterally . XII - Tongue protrusion intact .  Motor Strength - bilateral upper extremity weakness- distal weakness >proximal weakness with mild tremors.  Bilateral grip strength 2/5, 2/5 bilateral wrist flexion, bilateral lower extremity weakness, 1/5 bilateral dorsiflexion/extension  Motor Tone - Muscle tone was assessed at the neck and appendages and was normal . Reflexes -  absent bilaterally  Sensory - diminished on face and bilateral arms, tingling in bilateral legs Coordination - in bilateral arms, legs unable to be tested due to patient sitting in wheelchair and c/o back pain  Gait and Station - deferred.  Labs/Imaging/Neurodiagnostic studies   CBC:  Recent Labs  Lab 11/05/2023 1535  WBC 11.1*  NEUTROABS 5.9  HGB 15.2  HCT 43.9  MCV 89.0  PLT 408*    Basic Metabolic Panel:  Lab Results  Component Value Date   NA 130 (L) 05-Nov-2023   K 4.0 11/05/23   CO2 23 11/05/2023   GLUCOSE 96 Nov 05, 2023   BUN 15 11/05/23   CREATININE 0.82 11-05-23   CALCIUM 9.7 11/05/2023   GFRNONAA >60 11/05/2023   GFRAA 94 08/16/2008    Lipid Panel:  Lab Results  Component Value Date   LDLCALC 106 (H) 10/08/2023    HgbA1c:  Lab Results  Component Value Date   HGBA1C 5.3 09/22/2023    Urine Drug Screen:     Component Value Date/Time   LABOPIA NEG 09/29/2012 0830   COCAINSCRNUR Negative 08/18/2023 1114   COCAINSCRNUR NEG 09/29/2012 0830   LABBENZ POS (A) 09/29/2012 0830   AMPHETMU NEG 09/29/2012 0830     Alcohol Level No results found for: "ETH"  INR  Lab Results  Component Value Date   INR 1.0 08/29/2023    APTT  Lab Results  Component Value Date   APTT  32 08/29/2023    AED levels: No results found for: "PHENYTOIN", "ZONISAMIDE", "LAMOTRIGINE", "LEVETIRACETA"    ASSESSMENT   IRBIN KURMAN is a 58 y.o. male who has been recently diagnosed with CIDP and was recently discharged from rehab on 10/20/2023. Since discharge he has had progressive weakness and worsening symptoms to the point he is having trouble ambulating and doing simple tasks. He was sent from neurology office for admission and initiation of Plex treatments  RECOMMENDATIONS  - consulted IR for HD cath placement. (Order placed) -Plasma exchange treatments every other day for 5 treatments.  - Neurology will continue to follow - VC and NIF Q12 hr  - neurology will continue to follow  ______________________________________________________________________  Gevena Mart DNP, ACNPC-AG  [facility]  I have seen the patient reviewed the above note.  I have discussed plasmapheresis with the patient and obtained written consent which was left to be filed with the patient's paper chart.  Given the persistence of symptoms, I agree with Dr. Gilford Rile that this is likely CIDP.  He has been referred back by his neuromuscular specialist for CIDP given the short duration of benefit that he got from IVIG.  Once he has initiated therapy with his plasmapheresis, I would also favor starting steroids at a maintenance dose to try and induce and maintain remission.  Neurology will continue to follow  Ritta Slot, MD [facility] [phone]  If 7pm- 7am, please page neurology on call as listed in AMION.

## 2023-10-29 NOTE — ED Provider Notes (Signed)
[place] EMERGENCY DEPARTMENT AT [facility] Provider Note   CSN: [id number] Arrival date & time: 10/29/23  1419     History  Chief Complaint  Patient presents with   Weakness    Jonathan Luna is a 58 y.o. male.  58 year old male with recently diagnosed CIDP who presents emergency department with progressive weakness and worsening gait from clinic.  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  He underwent IVIG treatment in December.  Initially had improvement and has now had worsening of his symptoms so went to neurology clinic who referred him to the emergency department for PLEX therapy and admission.  Also says his hands are hurting as well.       Home Medications Prior to Admission medications   Medication Sig Start Date End Date Taking? Authorizing Provider  albuterol (VENTOLIN HFA) 108 (90 Base) MCG/ACT inhaler Inhale 1-2 puffs into the lungs every 6 (six) hours as needed for wheezing or shortness of breath. 10/21/23  Yes Lovorn, Megan, MD  aspirin EC 81 MG tablet Take 81 mg by mouth daily. Swallow whole.   Yes [provider]  Cholecalciferol (VITAMIN D-3) 25 MCG (1000 UT) CAPS Take 1 capsule by mouth daily.   Yes [provider]  clonazePAM (KLONOPIN) 0.5 MG tablet Take 1 tablet (0.5 mg total) by mouth 2 (two) times daily as needed for anxiety. 10/19/23  Yes Love, Evlyn Kanner, PA-C  DULoxetine (CYMBALTA) 60 MG capsule Take 1 capsule (60 mg total) by mouth daily. 10/29/23  Yes Levert Feinstein, MD  fluticasone (FLONASE) 50 MCG/ACT nasal spray Place 2 sprays into both nostrils daily. 08/18/23  Yes [name], MD  gabapentin (NEURONTIN) 300 MG capsule Take 3 capsules (900 mg total) by mouth 3 (three) times daily. 10/29/23  Yes Levert Feinstein, MD  irbesartan (AVAPRO) 300 MG tablet Take 1 tablet (300 mg total) by mouth daily. 10/19/23  Yes Love, Evlyn Kanner, PA-C  metoprolol succinate (TOPROL XL) 25 MG 24 hr tablet Take 1 tablet (25 mg total) by mouth daily. 10/19/23   Yes Love, Evlyn Kanner, PA-C  oxyCODONE-acetaminophen (PERCOCET/ROXICET) 5-325 MG tablet Take 1 tablet by mouth every 8 (eight) hours as needed for severe pain (pain score 7-10). 10/26/23  Yes [name], MD  sertraline (ZOLOFT) 25 MG tablet Take 1 tablet (25 mg total) by mouth daily. 10/26/23  Yes [name], MD  tiZANidine (ZANAFLEX) 4 MG tablet Take 1 tablet (4 mg total) by mouth 2 (two) times daily. 10/19/23  Yes Love, Evlyn Kanner, PA-C  UNITHROID 125 MCG tablet Take 1 tablet (125 mcg total) by mouth daily before breakfast. Patient taking differently: Take 125 mcg by mouth at bedtime. 09/09/23  Yes [name], MD  Oxcarbazepine (TRILEPTAL) 300 MG tablet Take 1 tablet (300 mg total) by mouth 2 (two) times daily. Patient not taking: Reported on 10/29/2023 10/29/23   Levert Feinstein, MD  predniSONE (DELTASONE) 10 MG tablet Take 6 tablets (60 mg total) by mouth daily with breakfast. Patient not taking: Reported on 10/29/2023 10/29/23   Levert Feinstein, MD      Allergies    Amoxil [amoxicillin], Effexor [venlafaxine], and Prozac [fluoxetine hcl]    Review of Systems   Review of Systems  Physical Exam Updated Vital Signs BP 119/71 (BP Location: Left Arm)   Pulse 93   Temp 97.6 F (36.4 C) (Oral)   Resp 20   Ht 5\' 7"  (1.702 m)   Wt 81.6 kg  SpO2 92%   BMI 28.18 kg/m  Physical Exam Vitals and nursing note reviewed.  Constitutional:      General: He is not in acute distress.    Appearance: He is well-developed.  HENT:     Head: Normocephalic and atraumatic.     Right Ear: External ear normal.     Left Ear: External ear normal.     Nose: Nose normal.  Eyes:     Extraocular Movements: Extraocular movements intact.     Conjunctiva/sclera: Conjunctivae normal.     Pupils: Pupils are equal, round, and reactive to light.  Cardiovascular:     Rate and Rhythm: Normal rate and regular rhythm.     Heart sounds: Normal heart sounds.  Pulmonary:     Effort: Pulmonary effort is normal. No  respiratory distress.     Breath sounds: Normal breath sounds.     Comments: Able to count to 40 in 1 breath Musculoskeletal:     Cervical back: Normal range of motion and neck supple.     Right lower leg: No edema.     Left lower leg: No edema.  Skin:    General: Skin is warm and dry.  Neurological:     Mental Status: He is alert. Mental status is at baseline.     Comments: Symmetric strength: 4/5 in shoulder abduction.  4/5 in elbow flexion and extension.  Weak grip strength bilaterally.  2/5 in wrist flexion and extension.  5/5 hip flexion.  4/5 knee flexion extension.  4/5 ankle dorsiflexion and plantarflexion.  Psychiatric:        Mood and Affect: Mood normal.        Behavior: Behavior normal.     ED Results / Procedures / Treatments   Labs (all labs ordered are listed, but only abnormal results are displayed) Labs Reviewed  CBC WITH DIFFERENTIAL/PLATELET - Abnormal; Notable for the following components:      Result Value   WBC 12.8 (*)    Platelets 425 (*)    Monocytes Absolute 1.1 (*)    Eosinophils Absolute 0.9 (*)    Basophils Absolute 0.2 (*)    All other components within normal limits  BASIC METABOLIC PANEL - Abnormal; Notable for the following components:   Sodium 127 (*)    Chloride 92 (*)    CO2 21 (*)    BUN 22 (*)    All other components within normal limits  BASIC METABOLIC PANEL - Abnormal; Notable for the following components:   Sodium 128 (*)    Potassium 3.1 (*)    Chloride 93 (*)    Glucose, Bld 122 (*)    BUN 21 (*)    All other components within normal limits  OSMOLALITY  IGG  IGA  COMPREHENSIVE METABOLIC PANEL  CBC  SODIUM, URINE, RANDOM  OSMOLALITY, URINE    EKG None  Radiology No results found.  Procedures Procedures    Medications Ordered in ED Medications  oxyCODONE-acetaminophen (PERCOCET/ROXICET) 5-325 MG per tablet 1 tablet (1 tablet Oral Given 10/29/23 2216)  irbesartan (AVAPRO) tablet 300 mg (has no administration in  time range)  metoprolol succinate (TOPROL-XL) 24 hr tablet 25 mg (has no administration in time range)  DULoxetine (CYMBALTA) DR capsule 60 mg (has no administration in time range)  sertraline (ZOLOFT) tablet 25 mg (has no administration in time range)  levothyroxine (SYNTHROID) tablet 125 mcg (125 mcg Oral Patient Refused/Not Given 10/29/23 2152)  gabapentin (NEURONTIN) capsule 900 mg (900 mg Oral Given  10/29/23 2216)  clonazePAM (KLONOPIN) tablet 0.5 mg (0.5 mg Oral Given 10/29/23 2134)  Oxcarbazepine (TRILEPTAL) tablet 300 mg (300 mg Oral Given 10/29/23 2237)  albuterol (PROVENTIL) (2.5 MG/3ML) 0.083% nebulizer solution 2.5 mg (has no administration in time range)  enoxaparin (LOVENOX) injection 40 mg (40 mg Subcutaneous Given 10/29/23 1746)  sodium chloride flush (NS) 0.9 % injection 3 mL (3 mLs Intravenous Given 10/29/23 2217)  acetaminophen (TYLENOL) tablet 650 mg (has no administration in time range)    Or  acetaminophen (TYLENOL) suppository 650 mg (has no administration in time range)  polyethylene glycol (MIRALAX / GLYCOLAX) packet 17 g (has no administration in time range)  0.9 %  sodium chloride infusion ( Intravenous New Bag/Given 10/29/23 1834)  nicotine polacrilex (NICORETTE) gum 2 mg (2 mg Oral Given 10/30/23 0015)  Oral care mouth rinse (has no administration in time range)  sodium chloride flush (NS) 0.9 % injection 10-40 mL (has no administration in time range)  sodium chloride flush (NS) 0.9 % injection 10-40 mL (has no administration in time range)  Chlorhexidine Gluconate Cloth 2 % PADS 6 each (has no administration in time range)  HYDROcodone-acetaminophen (NORCO/VICODIN) 5-325 MG per tablet 1 tablet (1 tablet Oral Given 10/29/23 1745)    ED Course/ Medical Decision Making/ A&P Clinical Course as of 10/30/23 0051  Thu Oct 29, 2023  1534 Dr Amada Jupiter from neurology recommends admission to hospitalist for PLEX. Patient is going to get a pheresis line now with IR.  [RP]  1716  Dr. Alinda Money to admit the patient to hospitalist. [RP]    Clinical Course User Index [RP] Rondel Baton, MD                                 Medical Decision Making Risk Decision regarding hospitalization.   WYLEE TENNY is a 58 y.o. male with comorbidities that complicate the patient evaluation including CIDP who comes to the emergency department with weakness  Initial Ddx:  CIDP, myasthenia gravis, Guillain-Barr, stroke, respiratory failure  MDM/Course:  Patient presents to the emergency department with worsening distal extremity weakness.  He is now having some difficulty walking around.  Does not feel short of breath at all.  Has had extensive workup already that is shown that he has CIDP.  On exam does have this distal weakness but no signs of respiratory failure at all.  Patient observed in the emergency department and upon re-evaluation was stable.  Was discussed with neurology who thinks the patient should be admitted for HD catheter placement and PLEX.  Admitted to hospitalist for further management.  This patient presents to the ED for concern of complaints listed in HPI, this involves an extensive number of treatment options, and is a complaint that carries with it a high risk of complications and morbidity. Disposition including potential need for admission considered.   Dispo: Admit to Floor  Additional history obtained from spouse Records reviewed DC Summary The following labs were independently interpreted: Chemistry and show no acute abnormality I have reviewed the patients home medications and made adjustments as needed Consults: Hospitalist and Neurology  Portions of this note were generated with Scientist, clinical (histocompatibility and immunogenetics). Dictation errors may occur despite best attempts at proofreading.     Final Clinical Impression(s) / ED Diagnoses Final diagnoses:  CIDP (chronic inflammatory demyelinating polyneuropathy) (HCC)    Rx / DC Orders ED Discharge Orders      None  Rondel Baton, MD 10/30/23 [phone]

## 2023-10-29 NOTE — Procedures (Signed)
Interventional Radiology Procedure Note  Procedure: Temporary hemodialysis catheter placement  Findings: Please refer to procedural dictation for full description. 16 cm Trialysis placed via right internal jugular.  Complications: None immediate  Estimated Blood Loss: < 5 mL  Recommendations: Catheter ready for immediate use.   Marliss Coots, MD

## 2023-10-29 NOTE — Progress Notes (Signed)
Pt is unable to do NIF and VC due to catheter in his neck he is in pain and doesn't want to due it at this time

## 2023-10-29 NOTE — H&P (Addendum)
History and Physical   Jonathan Luna ZOX:[id number] DOB: [birth year] DOA: 10/29/2023  PCP: [name], MD   Patient coming from: Neurology clinic  Chief Complaint: Worsening weakness and gait  HPI: Jonathan Luna is a 58 y.o. male with medical history significant of hypertension, hyperlipidemia, hypothyroidism, anxiety, neuropathy, chronic pain, CIDP presenting with worsening weakness and gait instability from neurology clinic.  Patient was admitted last month when diagnosed with CIDP and underwent IVIG.  Had improvement in symptoms following this.  However for the past several weeks he has had some gradual worsening of his symptoms again.  Now with worsening gait instability, upper extremity numbness and weakness.  Was reevaluated at neurology clinic today and sent to the ED to be admitted for plasma exchange therapy.  Denies fevers, chills, chest pain, shortness of breath, abdominal pain, constipation, diarrhea, nausea, vomiting.  ED Course: Vital signs in the ED notable for heart rate in the 100s to 110s, blood pressure in the 90s to 110 systolic.  Lab workup included BMP with sodium 127, chloride 92, BUN 22, bicarb 31.  CBC with leukocytosis to 12.8 and platelets of 425.  IgA and IgG pending.  Patient received Norco in the ED.  Neurology was consulted and are following.  Interventional radiology consulted for placement of catheter for plasma exchange.  Review of Systems: As per HPI otherwise all other systems reviewed and are negative.  Past Medical History:  Diagnosis Date   Allergy    Anxiety    Arthritis    Cataract    Community acquired pneumonia 08/28/2023   Depression    Detached retina    Heart murmur    History of bilateral cataract extraction    Hyperlipidemia    Hypothyroidism    Low back pain    Panic attack    Panic attacks    Pneumonia of right upper lobe due to infectious organism 08/18/2023    Past Surgical History:  Procedure Laterality Date   CATARACT  EXTRACTION     EYE SURGERY      Social History  reports that he has never smoked. He has been exposed to tobacco smoke. He has never used smokeless tobacco. He reports that he does not currently use alcohol. He reports that he does not use drugs.  Allergies  Allergen Reactions   Amoxil [Amoxicillin] Other (See Comments)    Pt states that it does not work for him when he had back to back pneumonia   Effexor [Venlafaxine] Diarrhea   Prozac [Fluoxetine Hcl] Diarrhea    Family History  Problem Relation Age of Onset   Breast cancer Mother    Hypertension Father    Heart disease Father    Asthma Father    Emphysema Father        smoker for 45 years   Diabetes Brother    Hyperlipidemia Brother   Reviewed on admission  Prior to Admission medications   Medication Sig Start Date End Date Taking? Authorizing Provider  albuterol (VENTOLIN HFA) 108 (90 Base) MCG/ACT inhaler Inhale 1-2 puffs into the lungs every 6 (six) hours as needed for wheezing or shortness of breath. 10/21/23   Lovorn, Aundra Millet, MD  aspirin EC 81 MG tablet Take 81 mg by mouth daily. Swallow whole.    [provider]  Cholecalciferol (VITAMIN D-3) 25 MCG (1000 UT) CAPS Take 1 capsule by mouth daily.    [provider]  clonazePAM (KLONOPIN) 0.5 MG tablet Take 1 tablet (0.5 mg total)  by mouth 2 (two) times daily as needed for anxiety. 10/19/23   Love, Evlyn Kanner, PA-C  DULoxetine (CYMBALTA) 60 MG capsule Take 1 capsule (60 mg total) by mouth daily. 10/29/23   Levert Feinstein, MD  fluticasone (FLONASE) 50 MCG/ACT nasal spray Place 2 sprays into both nostrils daily. 08/18/23   [name], MD  gabapentin (NEURONTIN) 300 MG capsule Take 3 capsules (900 mg total) by mouth 3 (three) times daily. 10/29/23   Levert Feinstein, MD  irbesartan (AVAPRO) 300 MG tablet Take 1 tablet (300 mg total) by mouth daily. 10/19/23   Love, Evlyn Kanner, PA-C  metoprolol succinate (TOPROL XL) 25 MG 24 hr tablet Take 1 tablet (25 mg total) by mouth daily.  10/19/23   Love, Evlyn Kanner, PA-C  Oxcarbazepine (TRILEPTAL) 300 MG tablet Take 1 tablet (300 mg total) by mouth 2 (two) times daily. 10/29/23   Levert Feinstein, MD  oxyCODONE-acetaminophen (PERCOCET/ROXICET) 5-325 MG tablet Take 1 tablet by mouth every 8 (eight) hours as needed for severe pain (pain score 7-10). 10/26/23   [name], MD  predniSONE (DELTASONE) 10 MG tablet Take 6 tablets (60 mg total) by mouth daily with breakfast. 10/29/23   Levert Feinstein, MD  sertraline (ZOLOFT) 25 MG tablet Take 1 tablet (25 mg total) by mouth daily. 10/26/23   [name], MD  tiZANidine (ZANAFLEX) 4 MG tablet Take 1 tablet (4 mg total) by mouth 2 (two) times daily. 10/19/23   Love, Evlyn Kanner, PA-C  UNITHROID 125 MCG tablet Take 1 tablet (125 mcg total) by mouth daily before breakfast. Patient taking differently: Take 125 mcg by mouth at bedtime. 09/09/23   [name], MD    Physical Exam: Vitals:   10/29/23 1417 10/29/23 1621  BP: (!) 117/91   Pulse: (!) 106   Resp: 16   Temp: 97.9 F (36.6 C)   TempSrc: Oral   SpO2: 96%   Weight:  81.6 kg  Height:  5\' 7"  (1.702 m)    Physical Exam Constitutional:      General: He is not in acute distress.    Appearance: Normal appearance.  HENT:     Head: Normocephalic and atraumatic.     Mouth/Throat:     Mouth: Mucous membranes are moist.     Pharynx: Oropharynx is clear.  Eyes:     Extraocular Movements: Extraocular movements intact.     Pupils: Pupils are equal, round, and reactive to light.  Neck:     Comments: Catheter Right Neck Cardiovascular:     Rate and Rhythm: Normal rate and regular rhythm.     Pulses: Normal pulses.     Heart sounds: Normal heart sounds.  Pulmonary:     Effort: Pulmonary effort is normal. No respiratory distress.     Breath sounds: Normal breath sounds.  Abdominal:     General: Bowel sounds are normal. There is no distension.     Palpations: Abdomen is soft.     Tenderness: There is no abdominal tenderness.   Musculoskeletal:        General: No swelling or deformity.  Skin:    General: Skin is warm and dry.  Neurological:     Comments: Bilateral upper extremity weakness and numbness.    Labs on Admission: I have personally reviewed following labs and imaging studies  CBC: Recent Labs  Lab 10/25/23 1535 10/29/23 1526  WBC 11.1* 12.8*  NEUTROABS 5.9 7.4  HGB 15.2 16.1  HCT 43.9 46.0  MCV 89.0  88.5  PLT 408* 425*    Basic Metabolic Panel: Recent Labs  Lab 10/25/23 1535 10/29/23 1526  NA 130* 127*  K 4.0 4.0  CL 96* 92*  CO2 23 21*  GLUCOSE 96 93  BUN 15 22*  CREATININE 0.82 0.88  CALCIUM 9.7 9.8    GFR: Estimated Creatinine Clearance: 94.7 mL/min (by C-G formula based on SCr of 0.88 mg/dL).  Liver Function Tests: Recent Labs  Lab 10/25/23 1535  AST 31  ALT 32  ALKPHOS 52  BILITOT 0.8  PROT 8.0  ALBUMIN 4.1    Urine analysis:    Component Value Date/Time   COLORURINE YELLOW 08/18/2023 1114   APPEARANCEUR CLEAR 08/18/2023 1114   LABSPEC <=1.005 (A) 08/18/2023 1114   PHURINE 6.0 08/18/2023 1114   GLUCOSEU NEGATIVE 08/18/2023 1114   HGBUR NEGATIVE 08/18/2023 1114   HGBUR negative 04/02/2010 0911   BILIRUBINUR NEGATIVE 08/18/2023 1114   KETONESUR NEGATIVE 08/18/2023 1114   UROBILINOGEN 0.2 08/18/2023 1114   NITRITE NEGATIVE 08/18/2023 1114   LEUKOCYTESUR NEGATIVE 08/18/2023 1114    Radiological Exams on Admission: No results found.  EKG: Independently reviewed. Sinus tachycardia at 107 BPM.  Assessment/Plan Principal Problem:   CIDP (chronic inflammatory demyelinating polyneuropathy) (HCC) Active Problems:   Hypothyroidism   Panic anxiety syndrome   Chronic pain disorder   Essential hypertension   Peripheral neuropathy   CIDP > Recurrent weakness and gait abnormality after improvement from IVIG during admission last month. > Sent from neurology clinic for plasma exchange. > Neurology consulted in the ED and will be following.  IR consulted  for catheter placement. - Monitor on progressive unit overnight - Appreciate neurology recommendations and assistance - Plasma exchange per neurology - Follow-up IgG, IgA - NIF / VC - Supportive care  Hyponatremia > Incidentally noted to have sodium of 127 in the ED.  He did have some lower sodiums during last admission.  Improved without intervention beyond fluids. - Check serum osmolality, urine osmolality, urine sodium - Normal saline at 75 cc an hour - Trend overnight  Hypertension - Continue home irbesartan and metoprolol  Hypothyroidism - Continue home thyroid supplementation  Anxiety - Continue duloxetine, sertraline, as needed clonazepam  Neuropathy - Continue home gabapentin and Trileptal  Chronic pain - Continue home duloxetine, gabapentin, as needed oxycodone  DVT prophylaxis: Lovenox Code Status:   Full Family Communication:  Updated at bedside  Disposition Plan:   Patient is from:  Neurology office/home  Anticipated DC to:  Home  Anticipated DC date:  2 to 5 days  Anticipated DC barriers: None  Consults called:  Neurology, interventional radiology (for line placement) Admission status:  Inpatient, progressive  Severity of Illness: The appropriate patient status for this patient is INPATIENT. Inpatient status is judged to be reasonable and necessary in order to provide the required intensity of service to ensure the patient's safety. The patient's presenting symptoms, physical exam findings, and initial radiographic and laboratory data in the context of their chronic comorbidities is felt to place them at high risk for further clinical deterioration. Furthermore, it is not anticipated that the patient will be medically stable for discharge from the hospital within 2 midnights of admission.   * I certify that at the point of admission it is my clinical judgment that the patient will require inpatient hospital care spanning beyond 2 midnights from the point of  admission due to high intensity of service, high risk for further deterioration and high frequency of surveillance required.*  Synetta Fail MD [facility]  How to contact the [facility] Attending or Consulting provider 7A - 7P or covering provider during after hours 7P -7A, for this patient?   Check the care team in [facility] and look for a) attending/consulting [facility] provider listed and b) the [facility] team listed Log into [email or web address] and use [place]'s universal password to access. If you do not have the password, please contact the hospital operator. Locate the [facility] provider you are looking for under [facility] and page to a number that you can be directly reached. If you still have difficulty reaching the provider, please page the [facility] (Director on Call) for the Hospitalists listed on amion for assistance.  10/29/2023, 5:15 PM

## 2023-10-29 NOTE — ED Triage Notes (Signed)
Patient BIB [facility] from neuro office after they called for the patient needing a plasma infusion, they refused to allow patient POV transport. Patient gets transfusions regularly and when he doesn't get them he gets weak, was weak in the office today so they called EMS. Patient A&Ox4, VSS.

## 2023-10-29 NOTE — ED Provider Triage Note (Signed)
Emergency Medicine Provider Triage Evaluation Note  Jonathan Luna , a 58 y.o. male  was evaluated in triage.  Pt complains of worsening weakness in all 4 extremities.  Lower extremities greater than the upper extremities.  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  He reports having multiple treatments of IVIG with improvement in his weakness.  He had gone to rehab and was starting to do better but over the last 2 to 3 weeks has had gradually worsening weakness now with tremors in his extremities due to the significant weakness with effort.  He saw Dr. Terrace Arabia with neurology today who sent him to the emergency room as she feels he will need admission for further IVIG treatments.  He denies any recent cough cold, congestion, change in medications..  Review of Systems  Positive: Generalized weakness worse in the legs Negative: Fever, cough, shortness of breath, chest pain, abdominal pain nausea or vomiting  Physical Exam  BP (!) 117/91 (BP Location: Left Arm)   Pulse (!) 106   Temp 97.9 F (36.6 C) (Oral)   Resp 16   SpO2 96%  Gen:   Awake, no distress   Resp:  Normal effort  MSK:   Weakness and moving the upper and lower extremities.  Patellar absent reflexes  other:  Cardiac with mild tachycardia.  Medical Decision Making  Medically screening exam initiated at 2:32 PM.  Appropriate orders placed.  Jonathan Luna was informed that the remainder of the evaluation will be completed by another provider, this initial triage assessment does not replace that evaluation, and the importance of remaining in the ED until their evaluation is complete.  Being sent in for exacerbation of Guillain-Barr.  Neurology is already aware and seeing patient in triage.  Admission labs are pending.  Plan will be for IVIG.   Gwyneth Sprout, MD 10/29/23 1434

## 2023-10-29 NOTE — ED Notes (Signed)
PT transported for procedure

## 2023-10-30 DIAGNOSIS — G6181 Chronic inflammatory demyelinating polyneuritis: Secondary | ICD-10-CM | POA: Diagnosis not present

## 2023-10-30 LAB — CBC
HCT: 42.6 % (ref 39.0–52.0)
Hemoglobin: 14.9 g/dL (ref 13.0–17.0)
MCH: 30.6 pg (ref 26.0–34.0)
MCHC: 35 g/dL (ref 30.0–36.0)
MCV: 87.5 fL (ref 80.0–100.0)
Platelets: 333 10*3/uL (ref 150–400)
RBC: 4.87 MIL/uL (ref 4.22–5.81)
RDW: 12.6 % (ref 11.5–15.5)
WBC: 9.2 10*3/uL (ref 4.0–10.5)
nRBC: 0 % (ref 0.0–0.2)

## 2023-10-30 LAB — COMPREHENSIVE METABOLIC PANEL
ALT: 22 U/L (ref 0–44)
AST: 18 U/L (ref 15–41)
Albumin: 3.7 g/dL (ref 3.5–5.0)
Alkaline Phosphatase: 57 U/L (ref 38–126)
Anion gap: 11 (ref 5–15)
BUN: 16 mg/dL (ref 6–20)
CO2: 25 mmol/L (ref 22–32)
Calcium: 9 mg/dL (ref 8.9–10.3)
Chloride: 94 mmol/L — ABNORMAL LOW (ref 98–111)
Creatinine, Ser: 0.68 mg/dL (ref 0.61–1.24)
GFR, Estimated: >60 mL/min (ref >60)
Glucose, Bld: 99 mg/dL (ref 70–99)
Potassium: 3.4 mmol/L — ABNORMAL LOW (ref 3.5–5.1)
Sodium: 130 mmol/L — ABNORMAL LOW (ref 135–145)
Total Bilirubin: 0.8 mg/dL (ref 0.0–1.2)
Total Protein: 7.2 g/dL (ref 6.5–8.1)

## 2023-10-30 MED ORDER — SODIUM CHLORIDE 0.9% FLUSH: 10.0000 mL | INTRAVENOUS | Status: DC | PRN

## 2023-10-30 MED ORDER — HEPARIN SODIUM (PORCINE) 1000 UNIT/ML IJ SOLN
INTRAMUSCULAR | Status: AC
  Administered 2023-10-30: 1000 [IU]
  Filled 2023-10-30: qty 3

## 2023-10-30 MED ORDER — SODIUM CHLORIDE 0.9 % IV SOLN: Freq: Once | INTRAVENOUS | Status: DC

## 2023-10-30 MED ORDER — CALCIUM CARBONATE ANTACID 500 MG PO CHEW
2.0000 | CHEWABLE_TABLET | ORAL | Status: AC
  Administered 2023-10-30 (×2): 400 mg via ORAL
  Filled 2023-10-30 (×2): qty 2

## 2023-10-30 MED ORDER — ACD FORMULA A 0.73-2.45-2.2 GM/100ML VI SOLN
1000.0000 mL | Status: DC
  Administered 2023-10-30: 1000 mL
  Filled 2023-10-30 (×2): qty 1000

## 2023-10-30 MED ORDER — DIPHENHYDRAMINE HCL 25 MG PO CAPS
25.0000 mg | ORAL_CAPSULE | Freq: Four times a day (QID) | ORAL | Status: DC | PRN
  Administered 2023-10-30 – 2023-10-31 (×2): 25 mg via ORAL
  Filled 2023-10-30 (×2): qty 1

## 2023-10-30 MED ORDER — CHLORHEXIDINE GLUCONATE CLOTH 2 % EX PADS
6.0000 | MEDICATED_PAD | Freq: Every day | CUTANEOUS | Status: DC
  Administered 2023-10-30 – 2023-11-07 (×9): 6 via TOPICAL

## 2023-10-30 MED ORDER — CALCIUM GLUCONATE-NACL 2-0.675 GM/100ML-% IV SOLN
INTRAVENOUS | Status: AC
  Filled 2023-10-30: qty 100

## 2023-10-30 MED ORDER — GUAIFENESIN-DM 100-10 MG/5ML PO SYRP
5.0000 mL | ORAL_SOLUTION | ORAL | Status: DC | PRN
  Administered 2023-10-30: 5 mL via ORAL
  Filled 2023-10-30: qty 5

## 2023-10-30 MED ORDER — POTASSIUM CHLORIDE CRYS ER 20 MEQ PO TBCR
40.0000 meq | EXTENDED_RELEASE_TABLET | Freq: Two times a day (BID) | ORAL | Status: AC
  Administered 2023-10-30 – 2023-10-31 (×4): 40 meq via ORAL
  Filled 2023-10-30 (×3): qty 2

## 2023-10-30 MED ORDER — CALCIUM GLUCONATE-NACL 2-0.675 GM/100ML-% IV SOLN
2.0000 g | Freq: Once | INTRAVENOUS | Status: AC
  Administered 2023-10-30: 2000 mg via INTRAVENOUS
  Filled 2023-10-30: qty 100

## 2023-10-30 MED ORDER — SODIUM CHLORIDE 0.9 % IV SOLN
INTRAVENOUS | Status: AC
  Filled 2023-10-30 (×3): qty 200

## 2023-10-30 MED ORDER — HEPARIN SODIUM (PORCINE) 1000 UNIT/ML IJ SOLN: 1000.0000 [IU] | Freq: Once | INTRAMUSCULAR | Status: AC

## 2023-10-30 MED ORDER — PREDNISONE 20 MG PO TABS
60.0000 mg | ORAL_TABLET | Freq: Every day | ORAL | Status: DC
  Administered 2023-10-30 – 2023-11-10 (×12): 60 mg via ORAL
  Filled 2023-10-30 (×12): qty 3

## 2023-10-30 MED ORDER — SODIUM CHLORIDE 0.9% FLUSH
10.0000 mL | Freq: Two times a day (BID) | INTRAVENOUS | Status: DC
Start: 2023-10-30 — End: 2023-11-07
  Administered 2023-10-30 – 2023-11-07 (×15): 10 mL

## 2023-10-30 NOTE — TOC Initial Note (Signed)
Transition of Care [facility]) - Initial/Assessment Note    Patient Details  Name: Jonathan Luna MRN: [id number] Date of Birth: 01-02-1966  Transition of [facility]) CM/SW Contact:    Kermit Balo, RN Phone Number: 10/30/2023, 2:23 PM  Clinical Narrative:                  Pt is from home with his spouse. Spouse works during the daytime. Pt states his brother can check on him as needed.  Pt undergoing PLEX.  Pt may need therapy evals closer to d/c.  Pt states he drives when feeling well but his brother can assist if needed.  TOC following.  Expected Discharge Plan: Home/Self Care Barriers to Discharge: Continued Medical Work up   Patient Goals and CMS Choice            Expected Discharge Plan and Services       Living arrangements for the past 2 months: Single Family Home                                      Prior Living Arrangements/Services Living arrangements for the past 2 months: Single Family Home Lives with:: Spouse Patient language and need for interpreter reviewed:: Yes Do you feel safe going back to the place where you live?: Yes        Care giver support system in place?: Yes (comment) Current home services: DME (transfer bench/ cane/ rollator) Criminal Activity/Legal Involvement Pertinent to Current Situation/Hospitalization: No - Comment as needed  Activities of Daily Living   ADL Screening (condition at time of admission) Independently performs ADLs?: No Does the patient have a NEW difficulty with bathing/dressing/toileting/self-feeding that is expected to last >3 days?: No (needs assistance) Does the patient have a NEW difficulty with getting in/out of bed, walking, or climbing stairs that is expected to last >3 days?: No (needs assistance) Does the patient have a NEW difficulty with communication that is expected to last >3 days?: No Is the patient deaf or have difficulty hearing?: No Does the patient have difficulty seeing, even when wearing  glasses/contacts?: Yes Does the patient have difficulty concentrating, remembering, or making decisions?: No  Permission Sought/Granted                  Emotional Assessment Appearance:: Appears stated age Attitude/Demeanor/Rapport: Engaged Affect (typically observed): Accepting Orientation: : Oriented to Self, Oriented to Place, Oriented to  Time, Oriented to Situation   Psych Involvement: No (comment)  Admission diagnosis:  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] Patient Active Problem List   Diagnosis Date Noted   CIDP (chronic inflammatory demyelinating polyneuropathy) (HCC) 10/29/2023   Coping style affecting medical condition 10/18/2023   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/05/2023   AIDP (acute inflammatory demyelinating polyneuropathy) (HCC) 09/30/2023   Paresthesia 09/22/2023   Gait abnormality 09/22/2023   Tremor 09/04/2023   DOE (dyspnea on exertion) 09/04/2023   Peripheral neuropathy 08/28/2023   Tachycardia 08/18/2023   Hypogonadism male 10/24/2021   Vitamin D deficiency disease 09/08/2018   Essential hypertension 09/07/2018   Long-term current use of opiate analgesic 04/07/2018   Low back pain 05/08/2016   Allergic rhinitis 12/13/2012   Chronic pain disorder 09/29/2012   Routine general medical examination at a health care facility 10/08/2011   Irritable bowel syndrome 10/16/2010   DDD (degenerative disc disease), lumbar 04/09/2010   Hypothyroidism 05/03/2009   Hyperlipidemia  with target LDL less than 130 05/03/2009   Panic anxiety syndrome 05/03/2009   PCP:  [name], MD Pharmacy:   [facility] [address] [place] - [address] [place] [address] Phone: [phone] Fax: [phone]  [id number] [place], [place] - [address] [place] [address] Phone: [phone] Fax: [phone]  [facility] [address] [place] [address] Phone: [phone] Fax: [phone]     Social Drivers of Health (SDOH) Social History: SDOH Screenings   Food Insecurity: No Food Insecurity (10/29/2023)  Housing: Low Risk  (10/29/2023)  Transportation Needs: No Transportation Needs (10/29/2023)  Utilities: Not At Risk (10/29/2023)  Depression (PHQ2-9): Low Risk  (09/28/2023)  Financial Resource Strain: Patient Declined (05/27/2023)  Physical Activity: Insufficiently Active (05/27/2023)  Social Connections: Unknown (05/27/2023)  Stress: Stress Concern Present (05/27/2023)  Tobacco Use: Low Risk  (10/29/2023)   SDOH Interventions:     Readmission Risk Interventions     No data to display

## 2023-10-30 NOTE — Progress Notes (Addendum)
Received patient in bed to unit.  Alert and oriented.  Informed consent signed and in chart.   TX duration:  Patient tolerated well.  Transported back to the room  Alert, without acute distress.  Hand-off given to patient's nurse.   Access used: Yes Access issues: No  Total UF removed: TPE removed 3501 Medication(s) given: See MAR Post HD VS: See Below Grid Post HD weight: 82.7 kg   [facility]  10/30/23 1633  Vitals  Temp 98.1 F (36.7 C)  Temp Source Oral  Pulse Rate 94  ECG Heart Rate 98  Resp 18  BP 117/78  Oxygen Therapy  SpO2 98 %  O2 Device Room Air  Pain Assessment  Pain Scale 0-10  Pain Score 0  Apheresis   Procedure Comments Tx. Completed. Pt stable.  Post-apheresis  Net Removed (mL) 3501 mL  Replacement (mL) 2572 mL  ACDA infused (mL) 776 mL  Total Normal Saline Administered 166 mL  Total Calcium Administered 100 mL  Tolerated Procedure Yes  Post-Procedure Comments Pt. tolerated the procedure well. Pt. Voice no Complaints  Hemodialysis Catheter Right Internal jugular Triple lumen Temporary (Non-Tunneled)  Placement Date/Time: 10/29/23 1718   Serial / Lot #: [id number]  Expiration Date: 04/11/28  Time Out: Correct patient;Correct site;Correct procedure  Maximum sterile barrier precautions: Hand hygiene;Cap;Mask;Sterile gown;Sterile gloves;Large sterile s...  Site Condition No complications  Blue Lumen Status Flushed;Dead end cap in place;Heparin locked;Blood return noted  Red Lumen Status Flushed;Dead end cap in place;Heparin locked;Blood return noted  Purple Lumen Status Saline locked  Catheter fill solution Heparin 1000 units/ml  Catheter fill volume (Arterial) 1.3 cc  Catheter fill volume (Venous) 1.3  Dressing Type Transparent  Dressing Status Antimicrobial disc in place;Clean, Dry, Intact  Drainage Description None  Dressing Change Due 11/05/23  Post treatment catheter status Capped and Clamped

## 2023-10-30 NOTE — Progress Notes (Signed)
NEUROLOGY CONSULT FOLLOW UP NOTE   Date of service: October 30, 2023 Patient Name: Jonathan Luna MRN:  [id number] DOB:  13-Nov-1965  Interval Hx/subjective   No acute events overnight.  Consistent neurological exam. Plex treatment to start today.  Vitals   Vitals:   10/29/23 2152 10/30/23 0010 10/30/23 0358 10/30/23 0802  BP: 113/69 119/71 120/72 126/78  Pulse: 91 93 94 98  Resp: 18 20  18   Temp: 97.7 F (36.5 C) 97.6 F (36.4 C) 97.6 F (36.4 C) 97.8 F (36.6 C)  TempSrc: Oral Oral Oral Oral  SpO2: 92% 92% 94% 94%  Weight:      Height:         Body mass index is 28.18 kg/m.  Physical Exam   Constitutional: Appears well-developed and well-nourished.  Psych: Affect appropriate to situation.  Eyes: No scleral injection.  HENT: No OP obstrucion.  Head: Normocephalic.  Cardiovascular: Normal rate and regular rhythm.  Respiratory: Effort normal, non-labored breathing.  GI: Soft.  No distension. There is no tenderness.  Skin: WDI.   Neurologic Examination   Neuro: Mental Status: Patient is awake, alert, oriented to person, place, month, year, and situation. Patient is able to give a clear and coherent history. No signs of aphasia or neglect Cranial Nerves: II: Visual Fields are full. Pupils are equal, round, and reactive to light.   III,IV, VI: EOMI without ptosis or diploplia.  V: Facial sensation is symmetric to temperature VII: Facial movement is symmetric.  VIII: hearing is intact to voice X: Uvula elevates symmetrically XI: Shoulder shrug is symmetric. XII: tongue is midline without atrophy or fasciculations.  Motor: Overall, stronger proximally BUE: 4-/5 shoulder, 3/5 bicep/tricep, 2/5 grips bilaterally.  BLE: 3/5 hip and knee flexion, 2/5 dorsal and plantar flexion Sensory: Reduced throughout face and BUE. Reduced and tingling noted BLE, specifically from knee down.  Deep Tendon Reflexes: Absent  Cerebellar: FNF intact  bilaterally  Medications  Current Facility-Administered Medications:    acetaminophen (TYLENOL) tablet 650 mg, 650 mg, Oral, Q6H PRN **OR** acetaminophen (TYLENOL) suppository 650 mg, 650 mg, Rectal, Q6H PRN, Synetta Fail, MD   albuterol (PROVENTIL) (2.5 MG/3ML) 0.083% nebulizer solution 2.5 mg, 2.5 mg, Inhalation, Q6H PRN, Synetta Fail, MD   Chlorhexidine Gluconate Cloth 2 % PADS 6 each, 6 each, Topical, Daily, Synetta Fail, MD   clonazePAM Scarlette Calico) tablet 0.5 mg, 0.5 mg, Oral, BID PRN, Synetta Fail, MD, 0.5 mg at 10/29/23 2134   DULoxetine (CYMBALTA) DR capsule 60 mg, 60 mg, Oral, Daily, Synetta Fail, MD   enoxaparin (LOVENOX) injection 40 mg, 40 mg, Subcutaneous, Q24H, Synetta Fail, MD, 40 mg at 10/29/23 1746   gabapentin (NEURONTIN) capsule 900 mg, 900 mg, Oral, TID, Synetta Fail, MD, 900 mg at 10/29/23 2216   guaiFENesin-dextromethorphan (ROBITUSSIN DM) 100-10 MG/5ML syrup 5 mL, 5 mL, Oral, Q4H PRN, Synetta Fail, MD, 5 mL at 10/30/23 4166   irbesartan (AVAPRO) tablet 300 mg, 300 mg, Oral, Daily, Synetta Fail, MD   levothyroxine (SYNTHROID) tablet 125 mcg, 125 mcg, Oral, QHS, Synetta Fail, MD   metoprolol succinate (TOPROL-XL) 24 hr tablet 25 mg, 25 mg, Oral, Daily, Synetta Fail, MD   nicotine polacrilex (NICORETTE) gum 2 mg, 2 mg, Oral, PRN, John Giovanni, MD, 2 mg at 10/30/23 0544   Oral care mouth rinse, 15 mL, Mouth Rinse, PRN, Synetta Fail, MD   Oxcarbazepine (TRILEPTAL) tablet 300 mg, 300 mg, Oral, BID, Beola Cord  B, MD, 300 mg at 10/29/23 2237   oxyCODONE-acetaminophen (PERCOCET/ROXICET) 5-325 MG per tablet 1 tablet, 1 tablet, Oral, Q8H PRN, Synetta Fail, MD, 1 tablet at 10/30/23 0544   polyethylene glycol (MIRALAX / GLYCOLAX) packet 17 g, 17 g, Oral, Daily PRN, Synetta Fail, MD   potassium chloride SA (KLOR-CON M) CR tablet 40 mEq, 40 mEq, Oral, BID, Dorcas Carrow, MD    sertraline (ZOLOFT) tablet 25 mg, 25 mg, Oral, Daily, Synetta Fail, MD   sodium chloride flush (NS) 0.9 % injection 10-40 mL, 10-40 mL, Intracatheter, Q12H, Synetta Fail, MD   sodium chloride flush (NS) 0.9 % injection 10-40 mL, 10-40 mL, Intracatheter, PRN, Synetta Fail, MD   sodium chloride flush (NS) 0.9 % injection 3 mL, 3 mL, Intravenous, Q12H, Synetta Fail, MD, 3 mL at 10/29/23 2217  Labs and Diagnostic Imaging   CBC:  Recent Labs  Lab 10/25/23 1535 10/29/23 1526 10/30/23 0555  WBC 11.1* 12.8* 9.2  NEUTROABS 5.9 7.4  --   HGB 15.2 16.1 14.9  HCT 43.9 46.0 42.6  MCV 89.0 88.5 87.5  PLT 408* 425* 333    Basic Metabolic Panel:  Lab Results  Component Value Date   NA 130 (L) 10/30/2023   K 3.4 (L) 10/30/2023   CO2 25 10/30/2023   GLUCOSE 99 10/30/2023   BUN 16 10/30/2023   CREATININE 0.68 10/30/2023   CALCIUM 9.0 10/30/2023   GFRNONAA >60 10/30/2023   GFRAA 94 08/16/2008   Lipid Panel:  Lab Results  Component Value Date   LDLCALC 106 (H) 10/08/2023   HgbA1c:  Lab Results  Component Value Date   HGBA1C 5.3 09/22/2023   Urine Drug Screen:     Component Value Date/Time   LABOPIA NEG 09/29/2012 0830   COCAINSCRNUR Negative 08/18/2023 1114   COCAINSCRNUR NEG 09/29/2012 0830   LABBENZ POS (A) 09/29/2012 0830   AMPHETMU NEG 09/29/2012 0830    Alcohol Level No results found for: "ETH" INR  Lab Results  Component Value Date   INR 1.0 08/29/2023   APTT  Lab Results  Component Value Date   APTT 32 08/29/2023   AED levels: No results found for: "PHENYTOIN", "ZONISAMIDE", "LAMOTRIGINE", "LEVETIRACETA"  10/25/23 CT Head without contrast(Personally reviewed): - No acute findings  12/19 MRI Brain(Personally reviewed): - Normal brain MRI   Assessment   Jonathan Luna is a 58 y.o. male who has been recently diagnosed with CIDP and was recently discharged from rehab on 10/20/2023. He completed 5 days of IVIG mid-December 2024. Discharged  to CIR, discharged 10/20/23. Followed up with outpatient neurologist, Dr. Terrace Arabia, who sent patient to ED due to progressive weakness and worsening symptoms. He is having trouble ambulating and doing simple tasks. He was sent from neurology office for admission and initiation of Plex treatments.  Overall weakness, stronger distally then proximal. He only had a short duration of smyptom improvement after last month's IVIG treatments.   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   Recommendations   - Start PLEx treatment today. 2nd treatment Saturday, then every other day for a total of 5 treatments - Start 60mg  Prednisone maintenance steroids - Continue home gabapentin for neuropathic pain - Continue VC and NiF Q12H - Continue PT/OT - Encourage OOB for meals, if able.   We will continue to follow  ______________________________________________________________________    Pt seen by Neuro NP/APP and later by MD. Note/plan to be edited by MD as  needed.    Lynnae January, DNP, AGACNP-BC [facility] Please use AMION for contact information & EPIC for messaging.   I have seen the patient and reviewed the above note.  He is undergoing plasma exchange for CIDP and we will continue to follow along.  Ritta Slot, MD [facility] [phone]  If 7pm- 7am, please page neurology on call as listed in AMION.

## 2023-10-30 NOTE — Plan of Care (Signed)

## 2023-10-30 NOTE — Progress Notes (Signed)
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  Patient was in the hospital last month with similar symptoms and treated with IVIG.  Reported worsening symptoms since last 2 weeks.  Also with upper extremity numbness and weakness.  In the emergency room hemodynamically stable.  Admitted for plasma exchange.  Subjective: Patient seen and examined.  He has some chronic congestion and cough and that is chronic.  He was more concerned about taking his nicotine pouches and I allowed him to take it.  Central line was placed.  Waiting for plasmapheresis today.  Profound numbness and difficulty using both upper extremities.  Assessment & Plan:   CIDP: Recurrent weakness and gait abnormality.  Recommended plasma exchange.  Continue respiratory therapy evaluation, NIF and vital capacity.  All supportive care available. IR placed temporary catheter right internal jugular. Starting on plasma exchange plan for 5 sessions every other day. May need additional disease modifying agents after completion of therapy.  Neurology continues to follow.  Hyponatremia: Probably dehydrated.  Treated with isotonic fluid overnight with improvement of sodium from 127-130.  Will continue to monitor.  Discontinue fluid.  Encourage oral intake.  Hypokalemia: Will replace.  Continue to monitor electrolytes including magnesium and phosphorus.  Hypertension: Blood pressure is stable on irbesartan and metoprolol. Hypothyroidism: On thyroid supplementation. Anxiety disorder: On duloxetine, sertraline and as needed Klonopin. Neuropathy: On gabapentin Trileptal continued. Chronic pain syndrome: As above on Cymbalta gabapentin and as needed oxycodone.  Nicotine use: Patient  with significant nicotine use.  He wants to use his nicotine pouches, will allow to use it.  Also consult PT OT.   DVT prophylaxis: enoxaparin (LOVENOX) injection 40 mg Start: 10/29/23 1800   Code Status: Full code Family Communication: None at the bedside Disposition Plan: Status is: Inpatient Remains inpatient appropriate because: Active inpatient treatment     Consultants:  Neurology  Procedures:  Plasma exchange  Antimicrobials:  None     Objective: Vitals:   10/30/23 0010 10/30/23 0358 10/30/23 0802 10/30/23 0817  BP: 119/71 120/72 126/78   Pulse: 93 94 98 96  Resp: 20  18   Temp: 97.6 F (36.4 C) 97.6 F (36.4 C) 97.8 F (36.6 C)   TempSrc: Oral Oral Oral   SpO2: 92% 94% 94% 96%  Weight:   82.7 kg   Height:        Intake/Output Summary (Last 24 hours) at 10/30/2023 1142 Last data filed at 10/30/2023 0912 Gross per 24 hour  Intake 1337.5 ml  Output 1300 ml  Net 37.5 ml   Filed Weights   10/29/23 1621 10/30/23 0802  Weight: 81.6 kg 82.7 kg    Examination:  General exam: Appears calm and comfortable  Respiratory system: No added sounds. Cardiovascular system: S1 & S2 heard, RRR.  No pedal edema. Gastrointestinal system: Abdomen is nondistended, soft and nontender. No organomegaly or masses felt. Normal bowel sounds heard. Central nervous system: Alert and oriented.  Both upper extremity with profound weakness, 2/5.  Distal more than proximal weakness.  Lower extremities weakness, 2/5.  Diminished sensation both arms and legs.   Data Reviewed: I have personally reviewed following labs and imaging studies  CBC: Recent Labs  Lab 10/25/23 1535 10/29/23 1526 10/30/23 0555  WBC 11.1* 12.8* 9.2  NEUTROABS 5.9  7.4  --   HGB 15.2 16.1 14.9  HCT 43.9 46.0 42.6  MCV 89.0 88.5 87.5  PLT 408* 425* 333   Basic Metabolic Panel: Recent Labs  Lab 10/25/23 1535 10/29/23 1526 10/29/23 2229 10/30/23 0555  NA 130* 127* 128* 130*  K 4.0 4.0 3.1* 3.4*   CL 96* 92* 93* 94*  CO2 23 21* 24 25  GLUCOSE 96 93 122* 99  BUN 15 22* 21* 16  CREATININE 0.82 0.88 0.82 0.68  CALCIUM 9.7 9.8 9.1 9.0   GFR: Estimated Creatinine Clearance: 104.8 mL/min (by C-G formula based on SCr of 0.68 mg/dL). Liver Function Tests: Recent Labs  Lab 10/25/23 1535 10/30/23 0555  AST 31 18  ALT 32 22  ALKPHOS 52 57  BILITOT 0.8 0.8  PROT 8.0 7.2  ALBUMIN 4.1 3.7   No results for input(s): "LIPASE", "AMYLASE" in the last 168 hours. No results for input(s): "AMMONIA" in the last 168 hours. Coagulation Profile: No results for input(s): "INR", "PROTIME" in the last 168 hours. Cardiac Enzymes: No results for input(s): "CKTOTAL", "CKMB", "CKMBINDEX", "TROPONINI" in the last 168 hours. BNP (last 3 results) Recent Labs    09/04/23 1021  PROBNP 6.0   HbA1C: No results for input(s): "HGBA1C" in the last 72 hours. CBG: No results for input(s): "GLUCAP" in the last 168 hours. Lipid Profile: No results for input(s): "CHOL", "HDL", "LDLCALC", "TRIG", "CHOLHDL", "LDLDIRECT" in the last 72 hours. Thyroid Function Tests: No results for input(s): "TSH", "T4TOTAL", "FREET4", "T3FREE", "THYROIDAB" in the last 72 hours. Anemia Panel: No results for input(s): "VITAMINB12", "FOLATE", "FERRITIN", "TIBC", "IRON", "RETICCTPCT" in the last 72 hours. Sepsis Labs: No results for input(s): "PROCALCITON", "LATICACIDVEN" in the last 168 hours.  Recent Results (from the past 240 hours)  Resp panel by RT-PCR (RSV, Flu A&B, Covid) Anterior Nasal Swab     Status: None   Collection Time: 10/25/23  1:24 PM   Specimen: Anterior Nasal Swab  Result Value Ref Range Status   SARS Coronavirus 2 by RT PCR NEGATIVE NEGATIVE Final   Influenza A by PCR NEGATIVE NEGATIVE Final   Influenza B by PCR NEGATIVE NEGATIVE Final    Comment: (NOTE) The Xpert Xpress SARS-CoV-2/FLU/RSV plus assay is intended as an aid in the diagnosis of influenza from Nasopharyngeal swab specimens and should not  be used as a sole basis for treatment. Nasal washings and aspirates are unacceptable for Xpert Xpress SARS-CoV-2/FLU/RSV testing.  Fact Sheet for Patients: BloggerCourse.com  Fact Sheet for Healthcare Providers: SeriousBroker.it  This test is not yet approved or cleared by the [place] FDA and has been authorized for detection and/or diagnosis of SARS-CoV-2 by FDA under an Emergency Use Authorization (EUA). This EUA will remain in effect (meaning this test can be used) for the duration of the COVID-19 declaration under Section 564(b)(1) of the Act, 21 U.S.C. section 360bbb-3(b)(1), unless the authorization is terminated or revoked.     Resp Syncytial Virus by PCR NEGATIVE NEGATIVE Final    Comment: (NOTE) Fact Sheet for Patients: BloggerCourse.com  Fact Sheet for Healthcare Providers: SeriousBroker.it  This test is not yet approved or cleared by the [place] FDA and has been authorized for detection and/or diagnosis of SARS-CoV-2 by FDA under an Emergency Use Authorization (EUA). This EUA will remain in effect (meaning this test can be used) for the duration of the COVID-19 declaration under Section 564(b)(1) of the Act, 21 U.S.C. section 360bbb-3(b)(1), unless the authorization is terminated or revoked.  Performed at [facility]  [facility], [address] [place], [place] [address]          Radiology Studies: IR Fluoro Guide CV Line Right Result Date: 10/30/2023 INDICATION: 58 year old male with history of Guillain-Barre syndrome requiring central venous access for plasmapheresis. EXAM: NON-TUNNELED CENTRAL VENOUS HEMODIALYSIS CATHETER PLACEMENT WITH ULTRASOUND AND FLUOROSCOPIC GUIDANCE COMPARISON:  None Available. MEDICATIONS: None FLUOROSCOPY TIME:  6 seconds, 0 mGy COMPLICATIONS: None immediate. PROCEDURE: Informed written consent was obtained from the patient  after a discussion of the risks, benefits, and alternatives to treatment. Questions regarding the procedure were encouraged and answered. The right neck and chest were prepped with chlorhexidine in a sterile fashion, and a sterile drape was applied covering the operative field. Maximum barrier sterile technique with sterile gowns and gloves were used for the procedure. A timeout was performed prior to the initiation of the procedure. After the overlying soft tissues were anesthetized, a small venotomy incision was created and a micropuncture kit was utilized to access the internal jugular vein. Real-time ultrasound guidance was utilized for vascular access including the acquisition of a permanent ultrasound image documenting patency of the accessed vessel. The microwire was utilized to measure appropriate catheter length. A stiff glidewire was advanced to the level of the IVC. Under fluoroscopic guidance, the venotomy was serially dilated, ultimately allowing placement of a 16 cm temporary Trialysis catheter with tip ultimately terminating within the superior aspect of the right atrium. Final catheter positioning was confirmed and documented with a spot radiographic image. The catheter aspirates and flushes normally. The catheter was flushed with appropriate volume heparin dwells. The catheter exit site was secured with a 0 silk retention suture. A dressing was placed. The patient tolerated the procedure well without immediate post procedural complication. IMPRESSION: Successful placement of a right internal jugular approach 16 cm temporary dialysis catheter with tip terminating with in the superior aspect of the right atrium. The catheter is ready for immediate use. PLAN: This catheter may be converted to a tunneled dialysis catheter at a later date as indicated. Marliss Coots, MD [facility] Electronically Signed   By: Marliss Coots M.D.   On: 10/30/2023  08:06   IR US Guide Vasc Access Right Result Date: 10/30/2023 INDICATION: 58 year old male with history of Guillain-Barre syndrome requiring central venous access for plasmapheresis. EXAM: NON-TUNNELED CENTRAL VENOUS HEMODIALYSIS CATHETER PLACEMENT WITH ULTRASOUND AND FLUOROSCOPIC GUIDANCE COMPARISON:  None Available. MEDICATIONS: None FLUOROSCOPY TIME:  6 seconds, 0 mGy COMPLICATIONS: None immediate. PROCEDURE: Informed written consent was obtained from the patient after a discussion of the risks, benefits, and alternatives to treatment. Questions regarding the procedure were encouraged and answered. The right neck and chest were prepped with chlorhexidine in a sterile fashion, and a sterile drape was applied covering the operative field. Maximum barrier sterile technique with sterile gowns and gloves were used for the procedure. A timeout was performed prior to the initiation of the procedure. After the overlying soft tissues were anesthetized, a small venotomy incision was created and a micropuncture kit was utilized to access the internal jugular vein. Real-time ultrasound guidance was utilized for vascular access including the acquisition of a permanent ultrasound image documenting patency of the accessed vessel. The microwire was utilized to measure appropriate catheter length. A stiff glidewire was advanced to the level of the IVC. Under fluoroscopic guidance, the venotomy was serially dilated, ultimately allowing placement of a 16 cm temporary Trialysis catheter with tip ultimately terminating within the superior aspect of the right  atrium. Final catheter positioning was confirmed and documented with a spot radiographic image. The catheter aspirates and flushes normally. The catheter was flushed with appropriate volume heparin dwells. The catheter exit site was secured with a 0 silk retention suture. A dressing was placed. The patient tolerated the procedure well without immediate post procedural  complication. IMPRESSION: Successful placement of a right internal jugular approach 16 cm temporary dialysis catheter with tip terminating with in the superior aspect of the right atrium. The catheter is ready for immediate use. PLAN: This catheter may be converted to a tunneled dialysis catheter at a later date as indicated. Marliss Coots, MD [facility] Electronically Signed   By: Marliss Coots M.D.   On: 10/30/2023 08:06        Scheduled Meds:  calcium carbonate  2 tablet Oral Q3H   Chlorhexidine Gluconate Cloth  6 each Topical Daily   DULoxetine  60 mg Oral Daily   enoxaparin (LOVENOX) injection  40 mg Subcutaneous Q24H   gabapentin  900 mg Oral TID   heparin sodium (porcine)  1,000 Units Intracatheter Once   irbesartan  300 mg Oral Daily   levothyroxine  125 mcg Oral QHS   metoprolol succinate  25 mg Oral Daily   Oxcarbazepine  300 mg Oral BID   potassium chloride  40 mEq Oral BID   predniSONE  60 mg Oral Q breakfast   sertraline  25 mg Oral Daily   sodium chloride flush  10-40 mL Intracatheter Q12H   sodium chloride flush  3 mL Intravenous Q12H   Continuous Infusions:  albumin human 25 % 50 g in sodium chloride 0.9 %     calcium gluconate     citrate dextrose       LOS: 1 day    Time spent: 52 minutes    Dorcas Carrow, MD [facility]

## 2023-10-31 DIAGNOSIS — G6181 Chronic inflammatory demyelinating polyneuritis: Secondary | ICD-10-CM | POA: Diagnosis not present

## 2023-10-31 LAB — CBC WITH DIFFERENTIAL/PLATELET
Abs Immature Granulocytes: 0.06 10*3/uL (ref 0.00–0.07)
Basophils Absolute: 0.1 10*3/uL (ref 0.0–0.1)
Basophils Relative: 1 %
Eosinophils Absolute: 0.5 10*3/uL (ref 0.0–0.5)
Eosinophils Relative: 4 %
HCT: 43.7 % (ref 39.0–52.0)
Hemoglobin: 15.2 g/dL (ref 13.0–17.0)
Immature Granulocytes: 1 %
Lymphocytes Relative: 28 %
Lymphs Abs: 3.5 10*3/uL (ref 0.7–4.0)
MCH: 30.6 pg (ref 26.0–34.0)
MCHC: 34.8 g/dL (ref 30.0–36.0)
MCV: 88.1 fL (ref 80.0–100.0)
Monocytes Absolute: 0.8 10*3/uL (ref 0.1–1.0)
Monocytes Relative: 7 %
Neutro Abs: 7.4 10*3/uL (ref 1.7–7.7)
Neutrophils Relative %: 59 %
Platelets: 326 10*3/uL (ref 150–400)
RBC: 4.96 MIL/uL (ref 4.22–5.81)
RDW: 12.5 % (ref 11.5–15.5)
WBC: 12.5 10*3/uL — ABNORMAL HIGH (ref 4.0–10.5)
nRBC: 0 % (ref 0.0–0.2)

## 2023-10-31 LAB — COMPREHENSIVE METABOLIC PANEL
ALT: 15 U/L (ref 0–44)
AST: 16 U/L (ref 15–41)
Albumin: 4.2 g/dL (ref 3.5–5.0)
Alkaline Phosphatase: 27 U/L — ABNORMAL LOW (ref 38–126)
Anion gap: 7 (ref 5–15)
BUN: 8 mg/dL (ref 6–20)
CO2: 24 mmol/L (ref 22–32)
Calcium: 9 mg/dL (ref 8.9–10.3)
Chloride: 104 mmol/L (ref 98–111)
Creatinine, Ser: 0.66 mg/dL (ref 0.61–1.24)
GFR, Estimated: >60 mL/min (ref >60)
Glucose, Bld: 103 mg/dL — ABNORMAL HIGH (ref 70–99)
Potassium: 3.9 mmol/L (ref 3.5–5.1)
Sodium: 135 mmol/L (ref 135–145)
Total Bilirubin: 0.8 mg/dL (ref 0.0–1.2)
Total Protein: 6 g/dL — ABNORMAL LOW (ref 6.5–8.1)

## 2023-10-31 LAB — MAGNESIUM: Magnesium: 1.9 mg/dL (ref 1.7–2.4)

## 2023-10-31 LAB — PHOSPHORUS: Phosphorus: 2.9 mg/dL (ref 2.5–4.6)

## 2023-10-31 MED ORDER — HEPARIN SODIUM (PORCINE) 1000 UNIT/ML IJ SOLN
INTRAMUSCULAR | Status: AC
  Administered 2023-10-31: 1000 [IU]
  Filled 2023-10-31: qty 3

## 2023-10-31 MED ORDER — SODIUM CHLORIDE 0.9 % IV SOLN: Freq: Once | INTRAVENOUS | Status: DC

## 2023-10-31 MED ORDER — HEPARIN SODIUM (PORCINE) 1000 UNIT/ML IJ SOLN
1000.0000 [IU] | Freq: Once | INTRAMUSCULAR | Status: AC
  Administered 2023-10-31: 1000 [IU]

## 2023-10-31 MED ORDER — LEVOTHYROXINE SODIUM 25 MCG PO TABS
125.0000 ug | ORAL_TABLET | Freq: Every day | ORAL | Status: DC
  Administered 2023-11-01 – 2023-11-10 (×10): 125 ug via ORAL
  Filled 2023-10-31 (×10): qty 1

## 2023-10-31 MED ORDER — ACETAMINOPHEN 325 MG PO TABS: 650.0000 mg | ORAL_TABLET | ORAL | Status: DC | PRN

## 2023-10-31 MED ORDER — ACD FORMULA A 0.73-2.45-2.2 GM/100ML VI SOLN
1000.0000 mL | Status: DC
  Administered 2023-10-31: 1000 mL

## 2023-10-31 MED ORDER — ACETAMINOPHEN 325 MG PO TABS
650.0000 mg | ORAL_TABLET | ORAL | Status: DC | PRN
  Administered 2023-10-31: 650 mg via ORAL

## 2023-10-31 MED ORDER — CALCIUM CARBONATE ANTACID 500 MG PO CHEW
2.0000 | CHEWABLE_TABLET | ORAL | Status: AC
Start: 2023-10-31 — End: 2023-10-31
  Administered 2023-10-31: 400 mg via ORAL
  Filled 2023-10-31: qty 2

## 2023-10-31 MED ORDER — CALCIUM GLUCONATE-NACL 2-0.675 GM/100ML-% IV SOLN
2.0000 g | Freq: Once | INTRAVENOUS | Status: AC
  Administered 2023-10-31: 2000 mg via INTRAVENOUS
  Filled 2023-10-31 (×2): qty 100

## 2023-10-31 MED ORDER — SODIUM CHLORIDE 0.9 % IV SOLN
INTRAVENOUS | Status: AC
  Filled 2023-10-31: qty 50
  Filled 2023-10-31 (×2): qty 200

## 2023-10-31 MED ORDER — CALCIUM CARBONATE ANTACID 500 MG PO CHEW
2.0000 | CHEWABLE_TABLET | ORAL | Status: AC
  Administered 2023-10-31: 400 mg via ORAL
  Filled 2023-10-31: qty 2

## 2023-10-31 NOTE — Progress Notes (Signed)
NIF -40 

## 2023-10-31 NOTE — Progress Notes (Signed)
NIF -40 VC not available

## 2023-10-31 NOTE — Progress Notes (Signed)
Nif -40  

## 2023-10-31 NOTE — Progress Notes (Addendum)
PT Cancellation Note  Patient Details Name: GABRYEL GOETZINGER MRN: [id number] DOB: 1966/07/22   Cancelled Treatment:    Reason Eval/Treat Not Completed: Patient at procedure or test/unavailable  1122  Patient off the unit. Will continue attempts.   1412 Patient off the unit.    Jerolyn Center, PT Acute Rehabilitation Services  Office [phone]  Zena Amos 10/31/2023, 11:23 AM

## 2023-10-31 NOTE — Progress Notes (Addendum)
OT Cancellation Note  Patient Details Name: Jonathan Luna MRN: [id number] DOB: 1966-04-28   Cancelled Treatment:    Reason Eval/Treat Not Completed: Patient at procedure or test/ unavailable  1415: Pt off floor, will reattempt OT eval as schedule permits  Carver Fila, OTD, OTR/L [facility] [facility] Acute Rehab [phone]   Carver Fila Koonce 10/31/2023, 2:10 PM

## 2023-10-31 NOTE — Progress Notes (Signed)
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  Patient was in the hospital last month with similar symptoms and treated with IVIG.  Reported worsening symptoms since last 2 weeks.  Also with upper extremity numbness and weakness.  In the emergency room hemodynamically stable.  Admitted for plasma exchange.  Subjective:  Patient seen and examined.  No new events.  Profound weakness persist but he thinks his hands may be slightly better than yesterday. Went to plasmapheresis yesterday.  Next 1 is due today.  Assessment & Plan:   CIDP: Recurrent weakness and gait abnormality.  Admitted for plasma exchange.  Continue respiratory therapy evaluation, NIF and vital capacity.  All supportive care available. IR placed temporary catheter right internal jugular.  Plasma exchange 1/17, 1/18. Started on high-dose steroids.  Neurology following.  Started working with PT OT.  Hyponatremia: Probably dehydrated.  Treated with isotonic fluid overnight with improvement of sodium from 127-130-135.  Encourage oral intake.  Hypokalemia: Replaced with improvement.  Magnesium adequate.  Phosphorus adequate.  Hypocalcemia: Replace.  Hypertension: Blood pressure is stable on irbesartan and metoprolol. Hypothyroidism: On thyroid supplementation. Anxiety disorder: On duloxetine, sertraline and as needed Klonopin. Neuropathy: On gabapentin Trileptal continued. Chronic pain syndrome: As above on Cymbalta gabapentin and as needed oxycodone.  Nicotine use: Patient with significant nicotine use.  He wants to use his nicotine pouches, will allow to use it.   DVT prophylaxis: enoxaparin (LOVENOX) injection 40 mg Start: 10/29/23 1800   Code Status: Full  code Family Communication: None at the bedside Disposition Plan: Status is: Inpatient Remains inpatient appropriate because: Active inpatient treatment     Consultants:  Neurology  Procedures:  Plasma exchange  Antimicrobials:  None     Objective: Vitals:   10/30/23 2339 10/31/23 0350 10/31/23 0700 10/31/23 0827  BP: 128/85 127/86  108/78  Pulse: 85 81  96  Resp: 18 18  18   Temp: 97.9 F (36.6 C) (!) 97.5 F (36.4 C)  97.6 F (36.4 C)  TempSrc: Oral Oral  Oral  SpO2: 95% 99%  98%  Weight:  84.9 kg 83.6 kg   Height:        Intake/Output Summary (Last 24 hours) at 10/31/2023 1113 Last data filed at 10/31/2023 0629 Gross per 24 hour  Intake 360 ml  Output 1350 ml  Net -990 ml   Filed Weights   10/30/23 1447 10/31/23 0350 10/31/23 0700  Weight: 82.7 kg 84.9 kg 83.6 kg    Examination:  General exam: Appears calm and comfortable today on exam. Respiratory system: No added sounds. Cardiovascular system: S1 & S2 heard, RRR.  No pedal edema. Gastrointestinal system: Abdomen is nondistended, soft and nontender. No organomegaly or masses felt. Normal bowel sounds heard. Central nervous system: Alert and oriented.  Pleasant interaction. Both upper extremity with profound weakness, 3/5.  Distal more than proximal weakness.  Lower extremities weakness, 2/5.  Diminished sensation both arms and legs.   Data Reviewed: I have personally reviewed following labs and imaging studies  CBC: Recent Labs  Lab 10/25/23 1535 10/29/23 1526 10/30/23 0555 10/31/23 0626  WBC 11.1* 12.8* 9.2 12.5*  NEUTROABS 5.9 7.4  --  7.4  HGB 15.2 16.1 14.9 15.2  HCT 43.9 46.0 42.6 43.7  MCV 89.0 88.5 87.5 88.1  PLT 408* 425* 333 326   Basic Metabolic Panel: Recent Labs  Lab 10/25/23 1535 10/29/23 1526 10/29/23 2229 10/30/23 0555 10/31/23 0626  NA 130* 127* 128* 130* 135  K 4.0 4.0 3.1* 3.4* 3.9  CL 96* 92* 93* 94* 104  CO2 23 21* 24 25 24   GLUCOSE 96 93 122* 99 103*  BUN 15  22* 21* 16 8  CREATININE 0.82 0.88 0.82 0.68 0.66  CALCIUM 9.7 9.8 9.1 9.0 9.0  MG  --   --   --   --  1.9  PHOS  --   --   --   --  2.9   GFR: Estimated Creatinine Clearance: 105.3 mL/min (by C-G formula based on SCr of 0.66 mg/dL). Liver Function Tests: Recent Labs  Lab 10/25/23 1535 10/30/23 0555 10/31/23 0626  AST 31 18 16   ALT 32 22 15  ALKPHOS 52 57 27*  BILITOT 0.8 0.8 0.8  PROT 8.0 7.2 6.0*  ALBUMIN 4.1 3.7 4.2   No results for input(s): "LIPASE", "AMYLASE" in the last 168 hours. No results for input(s): "AMMONIA" in the last 168 hours. Coagulation Profile: No results for input(s): "INR", "PROTIME" in the last 168 hours. Cardiac Enzymes: No results for input(s): "CKTOTAL", "CKMB", "CKMBINDEX", "TROPONINI" in the last 168 hours. BNP (last 3 results) Recent Labs    09/04/23 1021  PROBNP 6.0   HbA1C: No results for input(s): "HGBA1C" in the last 72 hours. CBG: No results for input(s): "GLUCAP" in the last 168 hours. Lipid Profile: No results for input(s): "CHOL", "HDL", "LDLCALC", "TRIG", "CHOLHDL", "LDLDIRECT" in the last 72 hours. Thyroid Function Tests: No results for input(s): "TSH", "T4TOTAL", "FREET4", "T3FREE", "THYROIDAB" in the last 72 hours. Anemia Panel: No results for input(s): "VITAMINB12", "FOLATE", "FERRITIN", "TIBC", "IRON", "RETICCTPCT" in the last 72 hours. Sepsis Labs: No results for input(s): "PROCALCITON", "LATICACIDVEN" in the last 168 hours.  Recent Results (from the past 240 hours)  Resp panel by RT-PCR (RSV, Flu A&B, Covid) Anterior Nasal Swab     Status: None   Collection Time: 10/25/23  1:24 PM   Specimen: Anterior Nasal Swab  Result Value Ref Range Status   SARS Coronavirus 2 by RT PCR NEGATIVE NEGATIVE Final   Influenza A by PCR NEGATIVE NEGATIVE Final   Influenza B by PCR NEGATIVE NEGATIVE Final    Comment: (NOTE) The Xpert Xpress SARS-CoV-2/FLU/RSV plus assay is intended as an aid in the diagnosis of influenza from  Nasopharyngeal swab specimens and should not be used as a sole basis for treatment. Nasal washings and aspirates are unacceptable for Xpert Xpress SARS-CoV-2/FLU/RSV testing.  Fact Sheet for Patients: BloggerCourse.com  Fact Sheet for Healthcare Providers: SeriousBroker.it  This test is not yet approved or cleared by the [place] FDA and has been authorized for detection and/or diagnosis of SARS-CoV-2 by FDA under an Emergency Use Authorization (EUA). This EUA will remain in effect (meaning this test can be used) for the duration of the COVID-19 declaration under Section 564(b)(1) of the Act, 21 U.S.C. section 360bbb-3(b)(1), unless the authorization is terminated or revoked.     Resp Syncytial Virus by PCR NEGATIVE NEGATIVE Final    Comment: (NOTE) Fact Sheet for Patients: BloggerCourse.com  Fact Sheet for Healthcare Providers: SeriousBroker.it  This test is not yet approved or cleared by the [place] FDA and has been authorized for detection and/or diagnosis of SARS-CoV-2 by FDA under an Emergency Use Authorization (EUA). This EUA will remain in effect (meaning this test can be  used) for the duration of the COVID-19 declaration under Section 564(b)(1) of the Act, 21 U.S.C. section 360bbb-3(b)(1), unless the authorization is terminated or revoked.  Performed at [facility], [address], [place], [place] [address]          Radiology Studies: IR Fluoro Guide CV Line Right Result Date: 10/30/2023 INDICATION: 57 year old male with history of Guillain-Barre syndrome requiring central venous access for plasmapheresis. EXAM: NON-TUNNELED CENTRAL VENOUS HEMODIALYSIS CATHETER PLACEMENT WITH ULTRASOUND AND FLUOROSCOPIC GUIDANCE COMPARISON:  None Available. MEDICATIONS: None FLUOROSCOPY TIME:  6 seconds, 0 mGy COMPLICATIONS: None immediate. PROCEDURE: Informed  written consent was obtained from the patient after a discussion of the risks, benefits, and alternatives to treatment. Questions regarding the procedure were encouraged and answered. The right neck and chest were prepped with chlorhexidine in a sterile fashion, and a sterile drape was applied covering the operative field. Maximum barrier sterile technique with sterile gowns and gloves were used for the procedure. A timeout was performed prior to the initiation of the procedure. After the overlying soft tissues were anesthetized, a small venotomy incision was created and a micropuncture kit was utilized to access the internal jugular vein. Real-time ultrasound guidance was utilized for vascular access including the acquisition of a permanent ultrasound image documenting patency of the accessed vessel. The microwire was utilized to measure appropriate catheter length. A stiff glidewire was advanced to the level of the IVC. Under fluoroscopic guidance, the venotomy was serially dilated, ultimately allowing placement of a 16 cm temporary Trialysis catheter with tip ultimately terminating within the superior aspect of the right atrium. Final catheter positioning was confirmed and documented with a spot radiographic image. The catheter aspirates and flushes normally. The catheter was flushed with appropriate volume heparin dwells. The catheter exit site was secured with a 0 silk retention suture. A dressing was placed. The patient tolerated the procedure well without immediate post procedural complication. IMPRESSION: Successful placement of a right internal jugular approach 16 cm temporary dialysis catheter with tip terminating with in the superior aspect of the right atrium. The catheter is ready for immediate use. PLAN: This catheter may be converted to a tunneled dialysis catheter at a later date as indicated. Marliss Coots, MD [facility] Radiology Electronically  Signed   By: Marliss Coots M.D.   On: 10/30/2023 08:06   IR US Guide Vasc Access Right Result Date: 10/30/2023 INDICATION: 58 year old male with history of Guillain-Barre syndrome requiring central venous access for plasmapheresis. EXAM: NON-TUNNELED CENTRAL VENOUS HEMODIALYSIS CATHETER PLACEMENT WITH ULTRASOUND AND FLUOROSCOPIC GUIDANCE COMPARISON:  None Available. MEDICATIONS: None FLUOROSCOPY TIME:  6 seconds, 0 mGy COMPLICATIONS: None immediate. PROCEDURE: Informed written consent was obtained from the patient after a discussion of the risks, benefits, and alternatives to treatment. Questions regarding the procedure were encouraged and answered. The right neck and chest were prepped with chlorhexidine in a sterile fashion, and a sterile drape was applied covering the operative field. Maximum barrier sterile technique with sterile gowns and gloves were used for the procedure. A timeout was performed prior to the initiation of the procedure. After the overlying soft tissues were anesthetized, a small venotomy incision was created and a micropuncture kit was utilized to access the internal jugular vein. Real-time ultrasound guidance was utilized for vascular access including the acquisition of a permanent ultrasound image documenting patency of the accessed vessel. The microwire was utilized to measure appropriate catheter length. A stiff glidewire was advanced to the level of the IVC.  Under fluoroscopic guidance, the venotomy was serially dilated, ultimately allowing placement of a 16 cm temporary Trialysis catheter with tip ultimately terminating within the superior aspect of the right atrium. Final catheter positioning was confirmed and documented with a spot radiographic image. The catheter aspirates and flushes normally. The catheter was flushed with appropriate volume heparin dwells. The catheter exit site was secured with a 0 silk retention suture. A dressing was placed. The patient tolerated the  procedure well without immediate post procedural complication. IMPRESSION: Successful placement of a right internal jugular approach 16 cm temporary dialysis catheter with tip terminating with in the superior aspect of the right atrium. The catheter is ready for immediate use. PLAN: This catheter may be converted to a tunneled dialysis catheter at a later date as indicated. Marliss Coots, MD [facility] Radiology Electronically Signed   By: Marliss Coots M.D.   On: 10/30/2023 08:06        Scheduled Meds:  calcium carbonate  2 tablet Oral Q3H   calcium carbonate  2 tablet Oral Q3H   Chlorhexidine Gluconate Cloth  6 each Topical Daily   DULoxetine  60 mg Oral Daily   enoxaparin (LOVENOX) injection  40 mg Subcutaneous Q24H   gabapentin  900 mg Oral TID   heparin sodium (porcine)  1,000 Units Intracatheter Once   irbesartan  300 mg Oral Daily   levothyroxine  125 mcg Oral QHS   metoprolol succinate  25 mg Oral Daily   Oxcarbazepine  300 mg Oral BID   predniSONE  60 mg Oral Q breakfast   sertraline  25 mg Oral Daily   sodium chloride flush  10-40 mL Intracatheter Q12H   sodium chloride flush  3 mL Intravenous Q12H   Continuous Infusions:  albumin human 25 % 50 g in sodium chloride 0.9 %     calcium gluconate     citrate dextrose     citrate dextrose       LOS: 2 days    Time spent: 40 minutes    Dorcas Carrow, MD [facility]

## 2023-10-31 NOTE — Progress Notes (Signed)
   10/31/23 1342  Report  Report Received From Cathleen Fears RN  Vitals  Temp 97.8 F (36.6 C)  Temp Source Oral  Pulse Rate 93  ECG Heart Rate 97  Resp 20  BP 110/66  Oxygen Therapy  SpO2 98 %  O2 Device Room Air  Pain Assessment  Pain Scale 0-10  Pain Score 0  Post-apheresis  Net Removed (mL) 3494 mL  Replacement (mL) 2494 mL  ACDA infused (mL) 855 mL  Total Normal Saline Administered 178 mL  Total Calcium Administered 100 mL  Tolerated Procedure Yes  Post-Procedure Comments Tx. completed without difficulties. Pt. VSS and instuctions of returning Monday for tx. #3.  Hemodialysis Catheter Right Internal jugular Triple lumen Temporary (Non-Tunneled)  Placement Date/Time: 10/29/23 1718   Serial / Lot #: [id number]  Expiration Date: 04/11/28  Time Out: Correct patient;Correct site;Correct procedure  Maximum sterile barrier precautions: Hand hygiene;Cap;Mask;Sterile gown;Sterile gloves;Large sterile s...  Site Condition No complications  Blue Lumen Status Flushed;Dead end cap in place;Heparin locked;Blood return noted  Red Lumen Status Flushed;Dead end cap in place;Heparin locked;Blood return noted  Purple Lumen Status Saline locked  Catheter fill solution Heparin 1000 units/ml  Catheter fill volume (Arterial) 1.3 cc  Catheter fill volume (Venous) 1.3  Dressing Type Transparent  Dressing Status Antimicrobial disc in place;Clean, Dry, Intact  Dressing Change Due 11/05/23  Post treatment catheter status Capped and Clamped   Received patient in bed to unit.  Alert and oriented.  Informed consent signed and in chart.   TX duration:  Patient tolerated well.  Transported back to the room  Alert, without acute distress.  Hand-off given to patient's nurse.   Access used: Yes Access issues: No  Total UF removed: 3494 Medication(s) given: See MAR Post HD VS: See Above Grid Post HD weight: 84.9 kg   [facility]

## 2023-10-31 NOTE — Plan of Care (Signed)

## 2023-11-01 DIAGNOSIS — G6181 Chronic inflammatory demyelinating polyneuritis: Secondary | ICD-10-CM | POA: Diagnosis not present

## 2023-11-01 LAB — IGA: IgA: 582 mg/dL — ABNORMAL HIGH (ref 90–386)

## 2023-11-01 LAB — IGG: IgG (Immunoglobin G), Serum: 1206 mg/dL (ref 603–1613)

## 2023-11-01 MED ORDER — OXYCODONE-ACETAMINOPHEN 5-325 MG PO TABS
1.0000 | ORAL_TABLET | Freq: Four times a day (QID) | ORAL | Status: DC | PRN
  Administered 2023-11-01 – 2023-11-05 (×11): 1 via ORAL
  Filled 2023-11-01 (×12): qty 1

## 2023-11-01 NOTE — Plan of Care (Signed)

## 2023-11-01 NOTE — Evaluation (Signed)
Occupational Therapy Evaluation Patient Details Name: Jonathan Luna MRN: [id number] DOB: 1966-05-25 Today's Date: 11/01/2023   History of Present Illness Pt is a 58 y/o male admitted 10/29/23  with worsening weakness and gait instability from neurology clinic. 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 To have plasma exchange therapy. Dialysis catheter RIJ placed.  PMHx  HTN, HLD, chronic pain, neuropathy, gait abnormality, CIDP   Clinical Impression   Pt was using rollator for mobility and was ind with ADL up until the past week, spouse has been assisting. Pt lives at home with spouse. Pt needing up to mod A for ADLs, mod I for bed mobility and min A for transfers with RW. Pt with decr strength/ROM/coordination in BUE, provided with pink foam cube and asked pt to bring wrist support cuff (that he has from prior admission) to assist with self/feeding/grooming tasks. Pt presenting with impairments listed below, will follow acutely. Patient will benefit from intensive inpatient follow up therapy, >3 hours/day to maximize safety/ind with ADL/functional mobility.        If plan is discharge home, recommend the following: A little help with walking and/or transfers;A lot of help with bathing/dressing/bathroom;Assistance with cooking/housework;Assist for transportation;Help with stairs or ramp for entrance    Functional Status Assessment  Patient has had a recent decline in their functional status and demonstrates the ability to make significant improvements in function in a reasonable and predictable amount of time.  Equipment Recommendations  BSC/3in1;Other (comment) (RW)    Recommendations for Other Services PT consult;Rehab consult     Precautions / Restrictions Precautions Precautions: Fall Restrictions Weight Bearing Restrictions Per Provider Order: No      Mobility Bed Mobility Overal bed mobility: Modified Independent                   Transfers Overall transfer level: Needs assistance Equipment used: Rolling walker (2 wheels) Transfers: Sit to/from Stand Sit to Stand: Min assist                  Balance Overall balance assessment: Needs assistance Sitting-balance support: No upper extremity supported, Feet unsupported Sitting balance-Leahy Scale: Good     Standing balance support: During functional activity, Reliant on assistive device for balance Standing balance-Leahy Scale: Poor Standing balance comment: relaint on external support                           ADL either performed or assessed with clinical judgement   ADL Overall ADL's : Needs assistance/impaired Eating/Feeding: Minimal assistance;Sitting   Grooming: Minimal assistance;Sitting   Upper Body Bathing: Moderate assistance;Sitting   Lower Body Bathing: Moderate assistance;Sitting/lateral leans   Upper Body Dressing : Moderate assistance;Sitting   Lower Body Dressing: Moderate assistance;Sitting/lateral leans   Toilet Transfer: Minimal assistance   Toileting- Clothing Manipulation and Hygiene: Minimal assistance       Functional mobility during ADLs: Minimal assistance;Rolling walker (2 wheels)       Vision   Vision Assessment?: No apparent visual deficits     Perception Perception: Not tested       Praxis Praxis: Not tested       Pertinent Vitals/Pain Pain Assessment Pain Assessment: Faces Pain Score: 4  Faces Pain Scale: Hurts little more Pain Location: low back Pain Descriptors / Indicators: Discomfort Pain Intervention(s): Limited activity within patient's tolerance, Monitored during session     Extremity/Trunk Assessment Upper Extremity Assessment Upper Extremity Assessment: RUE  deficits/detail;LUE deficits/detail;Right hand dominant RUE Deficits / Details: stronger proximally vs distally, 3+/5 shoulder/elbow ROM, 2/5 grasp, minimal wrist extension noted, tremoring noted RUE Coordination:  decreased fine motor;decreased gross motor LUE Deficits / Details: stronger proximally vs distally, 3+/5 shoulder/elbow ROM, 2/5 grasp, minimal wrist extension noted, tremoring noted LUE Coordination: decreased fine motor;decreased gross motor   Lower Extremity Assessment Lower Extremity Assessment: Defer to PT evaluation   Cervical / Trunk Assessment Cervical / Trunk Assessment: Normal   Communication Communication Communication: No apparent difficulties   Cognition Arousal: Alert Behavior During Therapy: WFL for tasks assessed/performed Overall Cognitive Status: Within Functional Limits for tasks assessed                                       General Comments       Exercises Other Exercises Other Exercises: pink foam cube squeeze   Shoulder Instructions      Home Living Family/patient expects to be discharged to:: Private residence Living Arrangements: Spouse/significant other Available Help at Discharge: Family;Available PRN/intermittently Type of Home: House Home Access: Stairs to enter [employer] of Steps: 1 Entrance Stairs-Rails: None Home Layout: One level     Bathroom Shower/Tub: Chief Strategy Officer: Standard Bathroom Accessibility: No How Accessible:  (rollator does not fit in bathroom) Home Equipment: Tub bench;Rollator (4 wheels);Grab bars - tub/shower          Prior Functioning/Environment Prior Level of Function : Independent/Modified Independent;History of Falls (last six months)             Mobility Comments: uses rollator ADLs Comments: was ind up until this admission, spouse has assist for ADLs        OT Problem List: Decreased strength;Decreased range of motion;Decreased activity tolerance;Impaired balance (sitting and/or standing);Decreased coordination;Impaired UE functional use      OT Treatment/Interventions: Self-care/ADL training;Therapeutic exercise;Energy conservation;DME and/or AE  instruction;Therapeutic activities;Patient/family education;Balance training    OT Goals(Current goals can be found in the care plan section) Acute Rehab OT Goals Patient Stated Goal: to get better OT Goal Formulation: With patient Time For Goal Achievement: 11/15/23 Potential to Achieve Goals: Good ADL Goals Pt Will Perform Upper Body Dressing: with supervision;sitting Pt Will Perform Lower Body Dressing: with supervision;sit to/from stand;sitting/lateral leans Pt Will Transfer to Toilet: with supervision;ambulating;regular height toilet Pt Will Perform Tub/Shower Transfer: with supervision;Tub transfer;Shower transfer;ambulating;tub bench Pt/caregiver will Perform Home Exercise Program: Both right and left upper extremity;With Supervision;With written HEP provided  OT Frequency: Min 1X/week    Co-evaluation              AM-PAC OT "6 Clicks" Daily Activity     Outcome Measure Help from another person eating meals?: A Little Help from another person taking care of personal grooming?: A Little Help from another person toileting, which includes using toliet, bedpan, or urinal?: A Little Help from another person bathing (including washing, rinsing, drying)?: A Lot Help from another person to put on and taking off regular upper body clothing?: A Lot Help from another person to put on and taking off regular lower body clothing?: A Lot 6 Click Score: 15   End of Session Equipment Utilized During Treatment: Gait belt;Rolling walker (2 wheels) Nurse Communication: Mobility status  Activity Tolerance: Patient tolerated treatment well Patient left: in bed;with call bell/phone within reach;with bed alarm set  OT Visit Diagnosis: Unsteadiness on feet (R26.81);Other abnormalities of gait and  mobility (R26.89);Muscle weakness (generalized) (M62.81)                Time: 1610-9604 OT Time Calculation (min): 34 min Charges:  OT General Charges $OT Visit: 1 Visit OT Evaluation $OT Eval  Moderate Complexity: 1 Mod OT Treatments $Self Care/Home Management : 8-22 mins  Carver Fila, OTD, OTR/L SecureChat Preferred Acute Rehab [phone]   Carver Fila Koonce 11/01/2023, 5:02 PM

## 2023-11-01 NOTE — Progress Notes (Signed)
Patient pulled a -40 with good effort.

## 2023-11-01 NOTE — Progress Notes (Signed)
Pt unavailable at this time to perform NIF. RT will continue to be available as needed.

## 2023-11-01 NOTE — Progress Notes (Signed)
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  Patient was in the hospital last month with similar symptoms and treated with IVIG.  Reported worsening symptoms since last 2 weeks.  Also with upper extremity numbness and weakness.  In the emergency room hemodynamically stable.  Admitted for plasma exchange.  Subjective:  Patient seen and examined.  Legs are stronger.  Hands are shaky but may be slightly stronger today.  Denies any other complaints.  He was wondering whether any medication is causing him shakiness.  I discussed case with neurology.  Wife at the bedside.  He is trying to work with physical therapy.  Assessment & Plan:   CIDP: Recurrent weakness and gait abnormality.  Admitted for plasma exchange.  Continue respiratory therapy evaluation, NIF and vital capacity.  All supportive care available. IR placed temporary catheter right internal jugular.  Plasma exchange 1/17, 1/18.  Next is scheduled for 1/20. Started on high-dose steroids.  Neurology following.  Continue to work with PT OT.  Hyponatremia: Probably dehydrated.  Treated with isotonic fluid  with improvement of sodium from 127-130-135.  Encourage oral intake.  Now maintaining sodium levels.  Hypokalemia: Replaced with improvement.  Magnesium adequate.  Phosphorus adequate.  Hypocalcemia: Replace.  Check tomorrow morning.  Hypertension: Blood pressure is stable on irbesartan and metoprolol. Hypothyroidism: On thyroid supplementation. Anxiety disorder: On duloxetine, sertraline and as needed Klonopin. Neuropathy: On gabapentin Trileptal continued. Chronic pain syndrome: As above on Cymbalta gabapentin and as needed oxycodone.  Nicotine use: Patient with  significant nicotine use.  wants to use his nicotine pouches, will allow to use it.   DVT prophylaxis: enoxaparin (LOVENOX) injection 40 mg Start: 10/29/23 1800   Code Status: Full code Family Communication: Wife at the bedside Disposition Plan: Status is: Inpatient Remains inpatient appropriate because: Active inpatient treatment     Consultants:  Neurology  Procedures:  Plasma exchange  Antimicrobials:  None     Objective: Vitals:   10/31/23 2344 11/01/23 0420 11/01/23 0437 11/01/23 0747  BP: (!) 106/57 126/78  114/75  Pulse: 79 72  91  Resp:      Temp: 98 F (36.7 C) 98.2 F (36.8 C)  97.6 F (36.4 C)  TempSrc: Axillary Axillary  Oral  SpO2: 98% 100%    Weight:   82.6 kg   Height:       No intake or output data in the 24 hours ending 11/01/23 1051  Filed Weights   10/31/23 0700 10/31/23 1143 11/01/23 0437  Weight: 83.6 kg 84.9 kg 82.6 kg    Examination:  General exam: Calm comfortable and interactive. Respiratory system: No added sounds. Cardiovascular system: S1 & S2 heard, RRR.  No pedal edema. Gastrointestinal system: Abdomen is nondistended, soft and nontender. No organomegaly or masses felt. Normal bowel sounds heard. Central nervous system: Alert and oriented.  Pleasant interaction. Both upper extremity with profound weakness, 3/5.  Distal more than proximal weakness.  Lower extremities weakness, 4/5.  Diminished sensation both arms and legs.   Data Reviewed: I have personally reviewed following labs and imaging studies  CBC: Recent Labs  Lab 10/25/23 1535 10/29/23 1526 10/30/23 0555 10/31/23 0626  WBC 11.1* 12.8* 9.2 12.5*  NEUTROABS 5.9 7.4  --  7.4  HGB 15.2 16.1 14.9 15.2  HCT 43.9 46.0 42.6 43.7  MCV 89.0 88.5 87.5 88.1  PLT 408* 425* 333 326   Basic Metabolic Panel: Recent Labs  Lab 10/25/23 1535 10/29/23 1526 10/29/23 2229 10/30/23 0555 10/31/23 0626  NA 130* 127* 128* 130* 135  K 4.0 4.0 3.1* 3.4* 3.9  CL 96* 92* 93*  94* 104  CO2 23 21* 24 25 24   GLUCOSE 96 93 122* 99 103*  BUN 15 22* 21* 16 8  CREATININE 0.82 0.88 0.82 0.68 0.66  CALCIUM 9.7 9.8 9.1 9.0 9.0  MG  --   --   --   --  1.9  PHOS  --   --   --   --  2.9   GFR: Estimated Creatinine Clearance: 104.8 mL/min (by C-G formula based on SCr of 0.66 mg/dL). Liver Function Tests: Recent Labs  Lab 10/25/23 1535 10/30/23 0555 10/31/23 0626  AST 31 18 16   ALT 32 22 15  ALKPHOS 52 57 27*  BILITOT 0.8 0.8 0.8  PROT 8.0 7.2 6.0*  ALBUMIN 4.1 3.7 4.2   No results for input(s): "LIPASE", "AMYLASE" in the last 168 hours. No results for input(s): "AMMONIA" in the last 168 hours. Coagulation Profile: No results for input(s): "INR", "PROTIME" in the last 168 hours. Cardiac Enzymes: No results for input(s): "CKTOTAL", "CKMB", "CKMBINDEX", "TROPONINI" in the last 168 hours. BNP (last 3 results) Recent Labs    09/04/23 1021  PROBNP 6.0   HbA1C: No results for input(s): "HGBA1C" in the last 72 hours. CBG: No results for input(s): "GLUCAP" in the last 168 hours. Lipid Profile: No results for input(s): "CHOL", "HDL", "LDLCALC", "TRIG", "CHOLHDL", "LDLDIRECT" in the last 72 hours. Thyroid Function Tests: No results for input(s): "TSH", "T4TOTAL", "FREET4", "T3FREE", "THYROIDAB" in the last 72 hours. Anemia Panel: No results for input(s): "VITAMINB12", "FOLATE", "FERRITIN", "TIBC", "IRON", "RETICCTPCT" in the last 72 hours. Sepsis Labs: No results for input(s): "PROCALCITON", "LATICACIDVEN" in the last 168 hours.  Recent Results (from the past 240 hours)  Resp panel by RT-PCR (RSV, Flu A&B, Covid) Anterior Nasal Swab     Status: None   Collection Time: 10/25/23  1:24 PM   Specimen: Anterior Nasal Swab  Result Value Ref Range Status   SARS Coronavirus 2 by RT PCR NEGATIVE NEGATIVE Final   Influenza A by PCR NEGATIVE NEGATIVE Final   Influenza B by PCR NEGATIVE NEGATIVE Final    Comment: (NOTE) The Xpert Xpress SARS-CoV-2/FLU/RSV plus assay  is intended as an aid in the diagnosis of influenza from Nasopharyngeal swab specimens and should not be used as a sole basis for treatment. Nasal washings and aspirates are unacceptable for Xpert Xpress SARS-CoV-2/FLU/RSV testing.  Fact Sheet for Patients: BloggerCourse.com  Fact Sheet for Healthcare Providers: SeriousBroker.it  This test is not yet approved or cleared by the [place] FDA and has been authorized for detection and/or diagnosis of SARS-CoV-2 by FDA under an Emergency Use Authorization (EUA). This EUA will remain in effect (meaning this test can be used) for the duration of the COVID-19 declaration under Section 564(b)(1) of the Act, 21 U.S.C. section 360bbb-3(b)(1), unless the authorization is terminated or revoked.     Resp Syncytial Virus by PCR NEGATIVE NEGATIVE Final    Comment: (NOTE) Fact Sheet for Patients: BloggerCourse.com  Fact Sheet for Healthcare Providers: SeriousBroker.it  This test is not yet approved or cleared by the [place] FDA and has been authorized for detection and/or diagnosis of SARS-CoV-2 by FDA under an Emergency Use Authorization (  EUA). This EUA will remain in effect (meaning this test can be used) for the duration of the COVID-19 declaration under Section 564(b)(1) of the Act, 21 U.S.C. section 360bbb-3(b)(1), unless the authorization is terminated or revoked.  Performed at [facility], [address] [place], [place] [address]          Radiology Studies: No results found.       Scheduled Meds:  Chlorhexidine Gluconate Cloth  6 each Topical Daily   DULoxetine  60 mg Oral Daily   enoxaparin (LOVENOX) injection  40 mg Subcutaneous Q24H   gabapentin  900 mg Oral TID   irbesartan  300 mg Oral Daily   levothyroxine  125 mcg Oral Q0600   metoprolol succinate  25 mg Oral Daily   Oxcarbazepine  300 mg  Oral BID   predniSONE  60 mg Oral Q breakfast   sertraline  25 mg Oral Daily   sodium chloride flush  10-40 mL Intracatheter Q12H   sodium chloride flush  3 mL Intravenous Q12H   Continuous Infusions:  citrate dextrose     citrate dextrose       LOS: 3 days    Time spent: 40 minutes    Dorcas Carrow, MD [facility]

## 2023-11-01 NOTE — Progress Notes (Signed)
NEUROLOGY CONSULT FOLLOW UP NOTE   Date of service: November 01, 2023 Patient Name: Jonathan Luna MRN:  [id number] DOB:  1966/08/13  Interval Hx/subjective   No acute events overnight.  Consistent neurological exam. PLEX treatment started 1/17, 1/18, to be continued 1/20, 1/22, 1/24 Patient with ongoing weakness overnight, concern for tremulousness/weakness overnight per primary team though patient reports his weakness is consistent from yesterday.  NIF -40 overnight, stable.   Vitals   Vitals:   10/31/23 2344 11/01/23 0420 11/01/23 0437 11/01/23 0747  BP: (!) 106/57 126/78  114/75  Pulse: 79 72  91  Resp:      Temp: 98 F (36.7 C) 98.2 F (36.8 C)  97.6 F (36.4 C)  TempSrc: Axillary Axillary  Oral  SpO2: 98% 100%    Weight:   82.6 kg   Height:        Body mass index is 28.52 kg/m.  Physical Exam   Constitutional: Appears well-developed and well-nourished.  Psych: Affect appropriate to situation. Calm and cooperative with exam.  Eyes: No scleral injection. Wears eyeglasses.  HENT: No OP obstrucion.  Head: Normocephalic.  Cardiovascular: Normal rate and regular rhythm.  Respiratory: Effort normal, non-labored breathing on room air.  GI: Soft.  No distension. There is no tenderness.  Skin: WDI.   Neurologic Examination  Mental Status: Patient is awake, alert, oriented to person, place, month, year, and situation. Patient is able to give a clear and coherent history of present events.  No signs of aphasia or neglect Cranial Nerves: II: Chronic superior visual field deficit, chronic right eye central vision loss, postsurgical irregular pupil. III,IV, VI: Dysconjugate gaze, EOMI (right eye out with left eye midline due to chronic right eye central vision loss)  V: Facial sensation is slightly decreased on the left and on the right lower face at the level of the NLF and bottom lip with sparing of the top lip.  Sensation to the face fluctuates per patient and is worse in the  mornings.  VII: Slight left mouth asymmetry noted VIII: Hearing is intact to voice X: Palate elevates symmetrically XI: Shoulder shrug is symmetric. XII: Tongue protrudes midline Motor: Overall, stronger proximally BUE: 4/5 shoulder, 4+/5 R bicep. 4-/5 R tricep, 4-/5 left bicep/tricep, 2/5 grips bilaterally, 1/5 wrist extension, 3/5 wrist flexion bilaterally, 1/5 finger abduction, 2/5 finger adduction  BLE: 4+/5 left hip flex/extension and right hip extension, 4-/5 right hip flexion, 4+/5 knee flexion, 1/5 dorsiflexion,  3/5 plantar flexion Sensory: Mildly reduced sensation throughout the upper and lower extremities, left face, and right lower face.  Deep Tendon Reflexes: Absent except for right patellar 1+ Cerebellar: FNF intact in context of weakness bilaterally  Medications  Current Facility-Administered Medications:    acetaminophen (TYLENOL) tablet 650 mg, 650 mg, Oral, Q6H PRN, 650 mg at 10/30/23 1315 **OR** acetaminophen (TYLENOL) suppository 650 mg, 650 mg, Rectal, Q6H PRN, Synetta Fail, MD   acetaminophen (TYLENOL) tablet 650 mg, 650 mg, Oral, Q4H PRN, Rejeana Brock, MD   acetaminophen (TYLENOL) tablet 650 mg, 650 mg, Oral, Q4H PRN, Rejeana Brock, MD, 650 mg at 10/31/23 1048   albuterol (PROVENTIL) (2.5 MG/3ML) 0.083% nebulizer solution 2.5 mg, 2.5 mg, Inhalation, Q6H PRN, Synetta Fail, MD, 2.5 mg at 10/30/23 8469   Chlorhexidine Gluconate Cloth 2 % PADS 6 each, 6 each, Topical, Daily, Synetta Fail, MD, 6 each at 10/31/23 1513   citrate dextrose (ACD-A anticoagulant) solution 1,000 mL, 1,000 mL, Other, Continuous, Kirkpatrick, [employer],  MD, 1,000 mL at 10/30/23 1454   citrate dextrose (ACD-A anticoagulant) solution 1,000 mL, 1,000 mL, Other, Continuous, Rejeana Brock, MD, 1,000 mL at 10/31/23 1143   clonazePAM (KLONOPIN) tablet 0.5 mg, 0.5 mg, Oral, BID PRN, Synetta Fail, MD, 0.5 mg at 11/01/23 0741   diphenhydrAMINE  (BENADRYL) capsule 25 mg, 25 mg, Oral, Q6H PRN, Rejeana Brock, MD, 25 mg at 10/31/23 1048   DULoxetine (CYMBALTA) DR capsule 60 mg, 60 mg, Oral, Daily, Synetta Fail, MD, 60 mg at 11/01/23 0740   enoxaparin (LOVENOX) injection 40 mg, 40 mg, Subcutaneous, Q24H, Synetta Fail, MD, 40 mg at 10/31/23 1711   gabapentin (NEURONTIN) capsule 900 mg, 900 mg, Oral, TID, Synetta Fail, MD, 900 mg at 11/01/23 0740   guaiFENesin-dextromethorphan (ROBITUSSIN DM) 100-10 MG/5ML syrup 5 mL, 5 mL, Oral, Q4H PRN, Synetta Fail, MD, 5 mL at 10/30/23 1610   irbesartan (AVAPRO) tablet 300 mg, 300 mg, Oral, Daily, Synetta Fail, MD, 300 mg at 11/01/23 0737   levothyroxine (SYNTHROID) tablet 125 mcg, 125 mcg, Oral, Q0600, Synetta Fail, MD, 125 mcg at 11/01/23 0641   metoprolol succinate (TOPROL-XL) 24 hr tablet 25 mg, 25 mg, Oral, Daily, Synetta Fail, MD, 25 mg at 11/01/23 9604   nicotine polacrilex (NICORETTE) gum 2 mg, 2 mg, Oral, PRN, John Giovanni, MD, 2 mg at 10/30/23 0544   Oral care mouth rinse, 15 mL, Mouth Rinse, PRN, Synetta Fail, MD   Oxcarbazepine (TRILEPTAL) tablet 300 mg, 300 mg, Oral, BID, Synetta Fail, MD, 300 mg at 11/01/23 0741   oxyCODONE-acetaminophen (PERCOCET/ROXICET) 5-325 MG per tablet 1 tablet, 1 tablet, Oral, Q8H PRN, Synetta Fail, MD, 1 tablet at 11/01/23 0740   polyethylene glycol (MIRALAX / GLYCOLAX) packet 17 g, 17 g, Oral, Daily PRN, Synetta Fail, MD   predniSONE (DELTASONE) tablet 60 mg, 60 mg, Oral, Q breakfast, Richardo Priest, Erin C, NP, 60 mg at 11/01/23 0740   sertraline (ZOLOFT) tablet 25 mg, 25 mg, Oral, Daily, Synetta Fail, MD, 25 mg at 11/01/23 5409   sodium chloride flush (NS) 0.9 % injection 10-40 mL, 10-40 mL, Intracatheter, Q12H, Synetta Fail, MD, 10 mL at 10/31/23 0918   sodium chloride flush (NS) 0.9 % injection 10-40 mL, 10-40 mL, Intracatheter, PRN, Synetta Fail, MD   sodium  chloride flush (NS) 0.9 % injection 3 mL, 3 mL, Intravenous, Q12H, Synetta Fail, MD, 3 mL at 10/31/23 2238  Labs and Diagnostic Imaging   CBC:  Recent Labs  Lab 10/29/23 1526 10/30/23 0555 10/31/23 0626  WBC 12.8* 9.2 12.5*  NEUTROABS 7.4  --  7.4  HGB 16.1 14.9 15.2  HCT 46.0 42.6 43.7  MCV 88.5 87.5 88.1  PLT 425* 333 326   Basic Metabolic Panel:  Lab Results  Component Value Date   NA 135 10/31/2023   K 3.9 10/31/2023   CO2 24 10/31/2023   GLUCOSE 103 (H) 10/31/2023   BUN 8 10/31/2023   CREATININE 0.66 10/31/2023   CALCIUM 9.0 10/31/2023   GFRNONAA >60 10/31/2023   GFRAA 94 08/16/2008   Lipid Panel:  Lab Results  Component Value Date   LDLCALC 106 (H) 10/08/2023   HgbA1c:  Lab Results  Component Value Date   HGBA1C 5.3 09/22/2023   Urine Drug Screen:     Component Value Date/Time   LABOPIA NEG 09/29/2012 0830   COCAINSCRNUR Negative 08/18/2023 1114   COCAINSCRNUR NEG 09/29/2012 0830   LABBENZ  POS (A) 09/29/2012 0830   AMPHETMU NEG 09/29/2012 0830    Alcohol Level No results found for: "ETH" INR  Lab Results  Component Value Date   INR 1.0 08/29/2023   APTT  Lab Results  Component Value Date   APTT 32 08/29/2023   AED levels: No results found for: "PHENYTOIN", "ZONISAMIDE", "LAMOTRIGINE", "LEVETIRACETA"  10/25/23 CT Head without contrast(Personally reviewed): - No acute findings  12/19 MRI Brain(Personally reviewed): - Normal brain MRI   Assessment  Jonathan Luna is a 58 y.o. male who has been recently diagnosed with CIDP and discharged from rehab on 10/20/2023 after 5 days of IVIG in mid-December 2024. Followed up with outpatient neurologist, Dr. Terrace Arabia, who sent patient to ED due to progressive weakness and worsening symptoms for PLEX therapy. He is having trouble ambulating and doing simple tasks. Overall weakness, stronger proximally than distally. He only had a short duration of smyptom improvement after last month's IVIG treatments.    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   Recommendations   - PLEX for a total of 5 days (completed 1/17, 1/18) every other day for a total of 5 treatments, to be complete 11/06/23 - Continue 60mg  Prednisone maintenance steroids - Continue home gabapentin for neuropathic pain - Continue VC and NiF Q12H - Continue PT/OT - Encourage OOB for meals, if able.   We will continue to follow  ______________________________________________________________________  Lanae Boast, AGACNP-BC [facility] Pager: [phone]   Attending Neurologist's note:  I personally saw this patient, gathering history, performing a brief examination, reviewing relevant labs, and formulated the assessment and plan, adding the note above for completeness and clarity to accurately reflect my thoughts  Brooke Dare MD-PhD [facility] [phone]

## 2023-11-01 NOTE — Evaluation (Signed)
Physical Therapy Evaluation Patient Details Name: Jonathan Luna MRN: [id number] DOB: 07-02-1966 Today's Date: 11/01/2023  History of Present Illness  Pt is a 58 y/o male admitted 10/29/23  with worsening weakness and gait instability from neurology clinic. 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 To have plasma exchange therapy. Dialysis catheter RIJ placed.  PMHx  HTN, HLD, chronic pain, neuropathy, gait abnormality, CIDP  Clinical Impression   Pt admitted secondary to problem above with deficits below. PTA patient was recently discharged home from [facility]. He was continuing to walk with RW. He was not using his AFOs inside the home as instructed by his therapist.  Pt currently requires min assist for sit to stand and CGA for ambulation x 75 ft with RW. Continued to reinforce that there is a fine line between doing enough activity and doing too much activity, which can be detrimental.  Anticipate patient will benefit from PT to address problems listed below. Will continue to follow acutely to maximize functional mobility independence and safety.           If plan is discharge home, recommend the following: A little help with walking and/or transfers;Assistance with cooking/housework;Assist for transportation;Help with stairs or ramp for entrance   Can travel by private vehicle        Equipment Recommendations None recommended by PT  Recommendations for Other Services  OT consult    Functional Status Assessment Patient has had a recent decline in their functional status and demonstrates the ability to make significant improvements in function in a reasonable and predictable amount of time.     Precautions / Restrictions Precautions Precautions: Fall Restrictions Weight Bearing Restrictions Per Provider Order: No      Mobility  Bed Mobility Overal bed mobility: Modified Independent             General bed mobility comments: HOB elevated; incr  time/effort supine to sit to supine    Transfers Overall transfer level: Needs assistance Equipment used: Rolling walker (2 wheels) Transfers: Sit to/from Stand Sit to Stand: Min assist           General transfer comment: unable to come to stand unassisted; min boost to clear hips and come forward over his feet    Ambulation/Gait Ambulation/Gait assistance: Contact guard assist Gait Distance (Feet): 75 Feet Assistive device: Rolling walker (2 wheels) Gait Pattern/deviations: Step-through pattern, Decreased stride length, Decreased dorsiflexion - right, Decreased dorsiflexion - left, Knee hyperextension - right, Knee hyperextension - left, Steppage   Gait velocity interpretation: 1.31 - 2.62 ft/sec, indicative of limited community ambulator   General Gait Details: using slipper socks; does not have AFOs here  Careers information officer     Tilt Bed    Modified Rankin (Stroke Patients Only)       Balance Overall balance assessment: Needs assistance Sitting-balance support: No upper extremity supported, Feet unsupported Sitting balance-Leahy Scale: Good     Standing balance support: Bilateral upper extremity supported, During functional activity, Reliant on assistive device for balance Standing balance-Leahy Scale: Poor                               Pertinent Vitals/Pain Pain Assessment Pain Assessment: Faces Faces Pain Scale: Hurts little more Pain Location: neck Pain Descriptors / Indicators: Aching Pain Intervention(s): Limited activity within patient's tolerance, Monitored during session, Repositioned  Home Living Family/patient expects to be discharged to:: Private residence Living Arrangements: Spouse/significant other Available Help at Discharge: Family Type of Home: House Home Access: Stairs to enter Entrance Stairs-Rails: None Entrance Stairs-Number of Steps: 1   Home Layout: One level Home Equipment: Grab bars -  tub/shower;Rollator (4 wheels);Wheelchair - manual;Other (comment) (bil AFOs)      Prior Function Prior Level of Function : Independent/Modified Independent;History of Falls (last six months)             Mobility Comments: ambulating with RW household distances       Extremity/Trunk Assessment   Upper Extremity Assessment Upper Extremity Assessment: Defer to OT evaluation (able to grip/maneuver RW)    Lower Extremity Assessment Lower Extremity Assessment: RLE deficits/detail;LLE deficits/detail RLE Deficits / Details: knee extension 3-, ankle DF1+ LLE Deficits / Details: knee extension 3-, ankle DF1+    Cervical / Trunk Assessment Cervical / Trunk Assessment: Normal  Communication   Communication Communication: No apparent difficulties  Cognition Arousal: Alert Behavior During Therapy: WFL for tasks assessed/performed Overall Cognitive Status: Within Functional Limits for tasks assessed                                          General Comments General comments (skin integrity, edema, etc.): Long discussion re: possible to overdo exercises and walking and cause set-back.    Exercises General Exercises - Lower Extremity Ankle Circles/Pumps: AROM, Both, 10 reps Heel Slides: AROM, Both, Other reps (comment) (25 reps) Other Exercises Other Exercises: bridging x 10   Assessment/Plan    PT Assessment Patient needs continued PT services  PT Problem List Decreased strength;Decreased activity tolerance;Decreased balance;Decreased mobility;Decreased knowledge of use of DME;Decreased knowledge of precautions;Impaired tone       PT Treatment Interventions DME instruction;Gait training;Stair training;Functional mobility training;Therapeutic activities;Therapeutic exercise;Balance training;Neuromuscular re-education;Patient/family education    PT Goals (Current goals can be found in the Care Plan section)  Acute Rehab PT Goals Patient Stated Goal: regain  the strength he has lost PT Goal Formulation: With patient Time For Goal Achievement: 11/15/23 Potential to Achieve Goals: Good    Frequency Min 1X/week     Co-evaluation               AM-PAC PT "6 Clicks" Mobility  Outcome Measure Help needed turning from your back to your side while in a flat bed without using bedrails?: None Help needed moving from lying on your back to sitting on the side of a flat bed without using bedrails?: None Help needed moving to and from a bed to a chair (including a wheelchair)?: A Little Help needed standing up from a chair using your arms (e.g., wheelchair or bedside chair)?: A Little Help needed to walk in hospital room?: A Little Help needed climbing 3-5 steps with a railing? : Total 6 Click Score: 18    End of Session Equipment Utilized During Treatment: Gait belt Activity Tolerance: Patient limited by fatigue Patient left: in bed;with bed alarm set Nurse Communication: Mobility status PT Visit Diagnosis: Other abnormalities of gait and mobility (R26.89);Muscle weakness (generalized) (M62.81);History of falling (Z91.81)    Time: 1610-9604 PT Time Calculation (min) (ACUTE ONLY): 33 min   Charges:   PT Evaluation $PT Eval Low Complexity: 1 Low PT Treatments $Therapeutic Exercise: 8-22 mins PT General Charges $$ ACUTE PT VISIT: 1 Visit  Jerolyn Center, PT Acute Rehabilitation Services  Office [phone]   Zena Amos 11/01/2023, 8:42 AM

## 2023-11-02 ENCOUNTER — Ambulatory Visit: Payer: 59 | Admitting: Physical Therapy

## 2023-11-02 DIAGNOSIS — G6181 Chronic inflammatory demyelinating polyneuritis: Secondary | ICD-10-CM | POA: Diagnosis not present

## 2023-11-02 LAB — CBC WITH DIFFERENTIAL/PLATELET
Abs Immature Granulocytes: 0.06 10*3/uL (ref 0.00–0.07)
Basophils Absolute: 0.1 10*3/uL (ref 0.0–0.1)
Basophils Relative: 1 %
Eosinophils Absolute: 0.3 10*3/uL (ref 0.0–0.5)
Eosinophils Relative: 3 %
HCT: 38.4 % — ABNORMAL LOW (ref 39.0–52.0)
Hemoglobin: 13.3 g/dL (ref 13.0–17.0)
Immature Granulocytes: 1 %
Lymphocytes Relative: 43 %
Lymphs Abs: 4.9 10*3/uL — ABNORMAL HIGH (ref 0.7–4.0)
MCH: 30.6 pg (ref 26.0–34.0)
MCHC: 34.6 g/dL (ref 30.0–36.0)
MCV: 88.5 fL (ref 80.0–100.0)
Monocytes Absolute: 0.7 10*3/uL (ref 0.1–1.0)
Monocytes Relative: 7 %
Neutro Abs: 4.9 10*3/uL (ref 1.7–7.7)
Neutrophils Relative %: 45 %
Platelets: 246 10*3/uL (ref 150–400)
RBC: 4.34 MIL/uL (ref 4.22–5.81)
RDW: 12.4 % (ref 11.5–15.5)
WBC: 11 10*3/uL — ABNORMAL HIGH (ref 4.0–10.5)
nRBC: 0 % (ref 0.0–0.2)

## 2023-11-02 LAB — COMPREHENSIVE METABOLIC PANEL
ALT: 14 U/L (ref 0–44)
AST: 16 U/L (ref 15–41)
Albumin: 3.6 g/dL (ref 3.5–5.0)
Alkaline Phosphatase: 22 U/L — ABNORMAL LOW (ref 38–126)
Anion gap: 7 (ref 5–15)
BUN: 9 mg/dL (ref 6–20)
CO2: 26 mmol/L (ref 22–32)
Calcium: 8.6 mg/dL — ABNORMAL LOW (ref 8.9–10.3)
Chloride: 104 mmol/L (ref 98–111)
Creatinine, Ser: 0.77 mg/dL (ref 0.61–1.24)
GFR, Estimated: >60 mL/min (ref >60)
Glucose, Bld: 85 mg/dL (ref 70–99)
Potassium: 3.5 mmol/L (ref 3.5–5.1)
Sodium: 137 mmol/L (ref 135–145)
Total Bilirubin: 0.6 mg/dL (ref 0.0–1.2)
Total Protein: 5.2 g/dL — ABNORMAL LOW (ref 6.5–8.1)

## 2023-11-02 LAB — PHOSPHORUS: Phosphorus: 3.6 mg/dL (ref 2.5–4.6)

## 2023-11-02 LAB — TYPE AND SCREEN
ABO/RH(D): O POS
Antibody Screen: NEGATIVE

## 2023-11-02 LAB — ABO/RH: ABO/RH(D): O POS

## 2023-11-02 LAB — MAGNESIUM: Magnesium: 2.1 mg/dL (ref 1.7–2.4)

## 2023-11-02 MED ORDER — SODIUM CHLORIDE 0.9 % IV SOLN
INTRAVENOUS | Status: AC
  Filled 2023-11-02 (×3): qty 200

## 2023-11-02 MED ORDER — CALCIUM GLUCONATE-NACL 2-0.675 GM/100ML-% IV SOLN
2.0000 g | Freq: Once | INTRAVENOUS | Status: AC
  Administered 2023-11-02: 2000 mg via INTRAVENOUS
  Filled 2023-11-02: qty 100

## 2023-11-02 MED ORDER — CALCIUM GLUCONATE-NACL 2-0.675 GM/100ML-% IV SOLN
INTRAVENOUS | Status: AC
  Filled 2023-11-02: qty 100

## 2023-11-02 MED ORDER — ACD FORMULA A 0.73-2.45-2.2 GM/100ML VI SOLN
1000.0000 mL | Status: DC
Start: 2023-11-02 — End: 2023-11-06
  Administered 2023-11-02: 1000 mL
  Filled 2023-11-02 (×3): qty 1000

## 2023-11-02 MED ORDER — DIPHENHYDRAMINE HCL 25 MG PO CAPS
50.0000 mg | ORAL_CAPSULE | Freq: Once | ORAL | Status: AC
Start: 2023-11-02 — End: 2023-11-02
  Administered 2023-11-02: 50 mg via ORAL
  Filled 2023-11-02: qty 2

## 2023-11-02 MED ORDER — HEPARIN SODIUM (PORCINE) 1000 UNIT/ML IJ SOLN
1000.0000 [IU] | Freq: Once | INTRAMUSCULAR | Status: AC
  Administered 2023-11-02: 1000 [IU]

## 2023-11-02 MED ORDER — ACETAMINOPHEN 325 MG PO TABS
650.0000 mg | ORAL_TABLET | ORAL | Status: DC | PRN
  Administered 2023-11-02 – 2023-11-04 (×2): 650 mg via ORAL
  Filled 2023-11-02: qty 2

## 2023-11-02 MED ORDER — HEPARIN SODIUM (PORCINE) 1000 UNIT/ML IJ SOLN
INTRAMUSCULAR | Status: AC
  Filled 2023-11-02: qty 3

## 2023-11-02 MED ORDER — SODIUM CHLORIDE 0.9 % IV SOLN: Freq: Once | INTRAVENOUS | Status: DC

## 2023-11-02 MED ORDER — CALCIUM CARBONATE ANTACID 500 MG PO CHEW
2.0000 | CHEWABLE_TABLET | ORAL | Status: AC
Start: 2023-11-02 — End: 2023-11-02
  Administered 2023-11-02 (×2): 400 mg via ORAL
  Filled 2023-11-02 (×2): qty 2

## 2023-11-02 NOTE — Progress Notes (Signed)
TPE  11/02/23 1348  Report  Report Received From Delrae Sawyers RN  Vitals  Temp 97.7 F (36.5 C)  Temp Source Oral  Pulse Rate 91  ECG Heart Rate 87  Resp 16  BP 114/76  Oxygen Therapy  SpO2 95 %  O2 Device Room Air  Pain Assessment  Pain Scale 0-10  Pain Score 0  Apheresis   Procedure Comments Tx. Completed without difficulties. Pt. VSS.  Post-apheresis  Net Removed (mL) 3799 mL  Replacement (mL) 2883 mL  ACDA infused (mL) 750 mL  Total Normal Saline Administered 166 mL  Total Calcium Administered 100 mL  Tolerated Procedure Yes  Post-Procedure Comments Pt. voiced no complaints. Instruction on when pt. return back for next tx, will be Wednesday, tx #4.Report call to 3W bedside RN.  Hemodialysis Catheter Right Internal jugular Triple lumen Temporary (Non-Tunneled)  Placement Date/Time: 10/29/23 1718   Serial / Lot #: [id number]  Expiration Date: 04/11/28  Time Out: Correct patient;Correct site;Correct procedure  Maximum sterile barrier precautions: Hand hygiene;Cap;Mask;Sterile gown;Sterile gloves;Large sterile s...  Site Condition No complications  Blue Lumen Status Flushed;Dead end cap in place;Heparin locked;Blood return noted  Red Lumen Status Flushed;Dead end cap in place;Heparin locked;Blood return noted  Purple Lumen Status Saline locked  Catheter fill solution Heparin 1000 units/ml  Catheter fill volume (Arterial) 1.3 cc  Catheter fill volume (Venous) 1.3  Dressing Type Transparent  Dressing Status Antimicrobial disc in place;Clean, Dry, Intact  Drainage Description None  Dressing Change Due 11/05/23  Post treatment catheter status Capped and Clamped   Received patient in bed to unit.  Alert and oriented.  Informed consent signed and in chart.   TX duration:  Patient tolerated well.  Transported back to the room  Alert, without acute distress.  Hand-off given to patient's nurse.   Access used: Yes Access issues: No  Total UF removed:  3799 Medication(s) given: See MAR Post HD VS: See Above Grid Post HD weight: 86 kg   [facility]

## 2023-11-02 NOTE — Progress Notes (Signed)
Inpatient Rehab Admissions Coordinator:   Per therapy recommendations, patient was screened for CIR candidacy by Megan Salon, MS, CCC-SLP. At this time, Pt. does not appear to require the intensity of CIR, as PT is recommending outpatient follow up. I will not pursue a rehab consult for this Pt.   Recommend other rehab venues to be pursued.  Please contact me with any questions.  Megan Salon, MS, CCC-SLP Rehab Admissions Coordinator  [phone] (celll) [phone] (office)

## 2023-11-02 NOTE — Progress Notes (Signed)
IS >2.5L (FVC devices not available) NIF > -50

## 2023-11-02 NOTE — Progress Notes (Signed)
Physical Therapy Treatment Patient Details Name: Jonathan Luna MRN: [id number] DOB: [birth year] Today's Date: 11/02/2023   History of Present Illness Pt is a 58 y/o male admitted 10/29/23  with worsening weakness and gait instability from neurology clinic. 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 To have plasma exchange therapy. Dialysis catheter RIJ placed.  PMHx  HTN, HLD, chronic pain, neuropathy, gait abnormality, CIDP    PT Comments  Pt demonstrating some incr strength in bil DF and bil Wrist extension. He expresses very frustrated with his decline overall and admits to working on exercises "all the time." Again encouraged him to look for signs he is fatiguing and to take frequent breaks so that he is not over-exerting himself and causing deterioration rather than improvement. Pushed himself to walk 200 ft with RW and then admitted his arms felt pretty tired after 100 ft. Attempted to work on not hyperextending his knees during stance, however pt unable to control. Explained how his AFOs provide ankle and knee support (pt to have AFOs and shoes brought in).     If plan is discharge home, recommend the following: A little help with walking and/or transfers;Assistance with cooking/housework;Assist for transportation;Help with stairs or ramp for entrance   Can travel by private vehicle        Equipment Recommendations  None recommended by PT    Recommendations for Other Services       Precautions / Restrictions Precautions Precautions: Fall Restrictions Weight Bearing Restrictions Per Provider Order: No     Mobility  Bed Mobility Overal bed mobility: Modified Independent             General bed mobility comments: HOB elevated; incr time/effort supine to sit to supine    Transfers Overall transfer level: Needs assistance Equipment used: Rolling walker (2 wheels) Transfers: Sit to/from Stand Sit to Stand: Contact guard assist            General transfer comment: vc for hand placement; no boost required    Ambulation/Gait Ambulation/Gait assistance: Contact guard assist Gait Distance (Feet): 200 Feet Assistive device: Rolling walker (2 wheels) Gait Pattern/deviations: Step-through pattern, Decreased stride length, Decreased dorsiflexion - right, Decreased dorsiflexion - left, Knee hyperextension - right, Knee hyperextension - left, Steppage   Gait velocity interpretation: 1.31 - 2.62 ft/sec, indicative of limited community ambulator   General Gait Details: using slipper socks; does not have AFOs here--asked him to have AFOs and shoes brought to hospital; vc for proximity to [employer] Mobility     Tilt Bed    Modified Rankin (Stroke Patients Only)       Balance Overall balance assessment: Needs assistance Sitting-balance support: No upper extremity supported, Feet unsupported Sitting balance-Leahy Scale: Good     Standing balance support: During functional activity, Reliant on assistive device for balance Standing balance-Leahy Scale: Poor Standing balance comment: relaint on external support                            Cognition Arousal: Alert Behavior During Therapy: WFL for tasks assessed/performed Overall Cognitive Status: Within Functional Limits for tasks assessed                                 General Comments: decr awareness that his overexertion can  cause him to deteriorate        Exercises General Exercises - Lower Extremity Ankle Circles/Pumps: AROM, Both, 10 reps Heel Slides:  (pt reports he already did) Straight Leg Raises:  (pt reports he already did) Other Exercises Other Exercises: bridging--pt reported he already did    General Comments General comments (skin integrity, edema, etc.): Incr time again emphasizing need to "listen to his body" for signs that he is overdoing it. Patient reports his arms began to shake after  ~100 ft but he pushed himself to do more (took the longer route back to his room).      Pertinent Vitals/Pain Pain Assessment Pain Assessment: No/denies pain    Home Living                          Prior Function            PT Goals (current goals can now be found in the care plan section) Acute Rehab PT Goals Patient Stated Goal: regain the strength he has lost Time For Goal Achievement: 11/15/23 Potential to Achieve Goals: Good Progress towards PT goals: Progressing toward goals    Frequency    Min 1X/week      PT Plan      Co-evaluation              AM-PAC PT "6 Clicks" Mobility   Outcome Measure  Help needed turning from your back to your side while in a flat bed without using bedrails?: None Help needed moving from lying on your back to sitting on the side of a flat bed without using bedrails?: None Help needed moving to and from a bed to a chair (including a wheelchair)?: A Little Help needed standing up from a chair using your arms (e.g., wheelchair or bedside chair)?: A Little Help needed to walk in hospital room?: A Little Help needed climbing 3-5 steps with a railing? : Total 6 Click Score: 18    End of Session Equipment Utilized During Treatment: Gait belt Activity Tolerance: Patient limited by fatigue Patient left: in bed;Other (comment) (with RT) Nurse Communication: Mobility status PT Visit Diagnosis: Other abnormalities of gait and mobility (R26.89);Muscle weakness (generalized) (M62.81);History of falling (Z91.81)     Time: 1191-4782 PT Time Calculation (min) (ACUTE ONLY): 16 min  Charges:    $Gait Training: 8-22 mins PT General Charges $$ ACUTE PT VISIT: 1 Visit                      Jerolyn Center, PT Acute Rehabilitation Services  Office [phone]    Zena Amos 11/02/2023, 11:18 AM

## 2023-11-02 NOTE — Progress Notes (Signed)
PROGRESS NOTE    ERLE MANTOOTH  UYQ:[id number] DOB: 1965/12/30 DOA: 10/29/2023 PCP: [name], MD    Brief Narrative:  58 year old with history of hypertension, hyperlipidemia, hypothyroidism and anxiety , neuropathy and CIDP presented with worsening weakness and gait instability from neurology clinic.  Patient was in the hospital last month with similar symptoms and treated with IVIG.  Reported worsening symptoms since last 2 weeks.  Also with upper extremity numbness and weakness.  In the emergency room hemodynamically stable.  Admitted for plasma exchange.  Subjective:  Patient seen and examined.  Much improvement on his leg strength.  Right hand getting stronger than left hand.  Wanted to see more therapies.  No other complaints.  Assessment & Plan:   CIDP: Recurrent weakness and gait abnormality.  Admitted for plasma exchange.  Continue respiratory therapy evaluation, NIF and vital capacity.  All supportive care available.  Stable so far. IR placed temporary catheter right internal jugular.  Plasma exchange 1/17, 1/18.  Next is scheduled for today. Started on high-dose steroids.  Neurology following.  Continue to work with PT OT. Recommended outpatient PT OT.  Hyponatremia: Due to dehydration.  Improved with isotonic fluid.  Hypokalemia: Replaced with improvement.  Magnesium adequate.  Phosphorus adequate.  Hypocalcemia: Adequate.  On Tums.  Hypertension: Blood pressure is stable on irbesartan and metoprolol. Hypothyroidism: On thyroid supplementation. Anxiety disorder: On duloxetine, sertraline and as needed Klonopin. Neuropathy: On gabapentin Trileptal continued. Chronic pain syndrome: As above on Cymbalta gabapentin and as needed oxycodone.  Nicotine use: Patient with significant nicotine use.  wants to use his nicotine pouches, will allow to use it.   DVT prophylaxis: enoxaparin (LOVENOX) injection 40 mg Start: 10/29/23 1800   Code Status: Full code Family  Communication: None today. Disposition Plan: Status is: Inpatient Remains inpatient appropriate because: Active inpatient treatment     Consultants:  Neurology  Procedures:  Plasma exchange  Antimicrobials:  None     Objective: Vitals:   11/02/23 0319 11/02/23 0736 11/02/23 0757 11/02/23 1117  BP: 108/81 119/78  137/84  Pulse: 78 78  91  Resp:    18  Temp: 97.6 F (36.4 C) (!) 97.5 F (36.4 C)  98 F (36.7 C)  TempSrc: Oral Oral  Oral  SpO2: 99% 97%  98%  Weight:   86 kg   Height:       No intake or output data in the 24 hours ending 11/02/23 1128  Filed Weights   10/31/23 1143 11/01/23 0437 11/02/23 0757  Weight: 84.9 kg 82.6 kg 86 kg    Examination:  General exam: Comfortable and interactive.  Pleasant to interaction.  On room air. Respiratory system: No added sounds. Cardiovascular system: S1 & S2 heard, RRR.  No pedal edema. Gastrointestinal system: Abdomen is nondistended, soft and nontender. No organomegaly or masses felt. Normal bowel sounds heard. Central nervous system: Alert and oriented.  Pleasant interaction. Both upper extremity with profound weakness, 3/5.  Distal more than proximal weakness.  Left side is weaker than right side.  Lower extremities weakness, 4/5.  Diminished sensation both arms and legs.   Data Reviewed: I have personally reviewed following labs and imaging studies  CBC: Recent Labs  Lab 10/29/23 1526 10/30/23 0555 10/31/23 0626 11/02/23 0551  WBC 12.8* 9.2 12.5* 11.0*  NEUTROABS 7.4  --  7.4 4.9  HGB 16.1 14.9 15.2 13.3  HCT 46.0 42.6 43.7 38.4*  MCV 88.5 87.5 88.1 88.5  PLT 425* 333 326 246  Basic Metabolic Panel: Recent Labs  Lab 10/29/23 1526 10/29/23 2229 10/30/23 0555 10/31/23 0626 11/02/23 0551  NA 127* 128* 130* 135 137  K 4.0 3.1* 3.4* 3.9 3.5  CL 92* 93* 94* 104 104  CO2 21* 24 25 24 26   GLUCOSE 93 122* 99 103* 85  BUN 22* 21* 16 8 9   CREATININE 0.88 0.82 0.68 0.66 0.77  CALCIUM 9.8 9.1 9.0 9.0  8.6*  MG  --   --   --  1.9 2.1  PHOS  --   --   --  2.9 3.6   GFR: Estimated Creatinine Clearance: 106.8 mL/min (by C-G formula based on SCr of 0.77 mg/dL). Liver Function Tests: Recent Labs  Lab 10/30/23 0555 10/31/23 0626 11/02/23 0551  AST 18 16 16   ALT 22 15 14   ALKPHOS 57 27* 22*  BILITOT 0.8 0.8 0.6  PROT 7.2 6.0* 5.2*  ALBUMIN 3.7 4.2 3.6   No results for input(s): "LIPASE", "AMYLASE" in the last 168 hours. No results for input(s): "AMMONIA" in the last 168 hours. Coagulation Profile: No results for input(s): "INR", "PROTIME" in the last 168 hours. Cardiac Enzymes: No results for input(s): "CKTOTAL", "CKMB", "CKMBINDEX", "TROPONINI" in the last 168 hours. BNP (last 3 results) Recent Labs    09/04/23 1021  PROBNP 6.0   HbA1C: No results for input(s): "HGBA1C" in the last 72 hours. CBG: No results for input(s): "GLUCAP" in the last 168 hours. Lipid Profile: No results for input(s): "CHOL", "HDL", "LDLCALC", "TRIG", "CHOLHDL", "LDLDIRECT" in the last 72 hours. Thyroid Function Tests: No results for input(s): "TSH", "T4TOTAL", "FREET4", "T3FREE", "THYROIDAB" in the last 72 hours. Anemia Panel: No results for input(s): "VITAMINB12", "FOLATE", "FERRITIN", "TIBC", "IRON", "RETICCTPCT" in the last 72 hours. Sepsis Labs: No results for input(s): "PROCALCITON", "LATICACIDVEN" in the last 168 hours.  Recent Results (from the past 240 hours)  Resp panel by RT-PCR (RSV, Flu A&B, Covid) Anterior Nasal Swab     Status: None   Collection Time: 10/25/23  1:24 PM   Specimen: Anterior Nasal Swab  Result Value Ref Range Status   SARS Coronavirus 2 by RT PCR NEGATIVE NEGATIVE Final   Influenza A by PCR NEGATIVE NEGATIVE Final   Influenza B by PCR NEGATIVE NEGATIVE Final    Comment: (NOTE) The Xpert Xpress SARS-CoV-2/FLU/RSV plus assay is intended as an aid in the diagnosis of influenza from Nasopharyngeal swab specimens and should not be used as a sole basis for treatment.  Nasal washings and aspirates are unacceptable for Xpert Xpress SARS-CoV-2/FLU/RSV testing.  Fact Sheet for Patients: BloggerCourse.com  Fact Sheet for Healthcare Providers: SeriousBroker.it  This test is not yet approved or cleared by the [place] FDA and has been authorized for detection and/or diagnosis of SARS-CoV-2 by FDA under an Emergency Use Authorization (EUA). This EUA will remain in effect (meaning this test can be used) for the duration of the COVID-19 declaration under Section 564(b)(1) of the Act, 21 U.S.C. section 360bbb-3(b)(1), unless the authorization is terminated or revoked.     Resp Syncytial Virus by PCR NEGATIVE NEGATIVE Final    Comment: (NOTE) Fact Sheet for Patients: BloggerCourse.com  Fact Sheet for Healthcare Providers: SeriousBroker.it  This test is not yet approved or cleared by the [place] FDA and has been authorized for detection and/or diagnosis of SARS-CoV-2 by FDA under an Emergency Use Authorization (EUA). This EUA will remain in effect (meaning this test can be used) for the duration of the COVID-19 declaration under Section 564(b)(1)  of the Act, 21 U.S.C. section 360bbb-3(b)(1), unless the authorization is terminated or revoked.  Performed at [facility], [address], [place], [place] [address]          Radiology Studies: No results found.       Scheduled Meds:  calcium carbonate  2 tablet Oral Q3H   Chlorhexidine Gluconate Cloth  6 each Topical Daily   diphenhydrAMINE  50 mg Oral Once   DULoxetine  60 mg Oral Daily   enoxaparin (LOVENOX) injection  40 mg Subcutaneous Q24H   gabapentin  900 mg Oral TID   heparin sodium (porcine)  1,000 Units Intracatheter Once   irbesartan  300 mg Oral Daily   levothyroxine  125 mcg Oral Q0600   metoprolol succinate  25 mg Oral Daily   Oxcarbazepine  300 mg Oral BID    predniSONE  60 mg Oral Q breakfast   sertraline  25 mg Oral Daily   sodium chloride flush  10-40 mL Intracatheter Q12H   sodium chloride flush  3 mL Intravenous Q12H   Continuous Infusions:  albumin human 25 % 50 g in sodium chloride 0.9 %     calcium gluconate     calcium gluconate     citrate dextrose     citrate dextrose       LOS: 4 days    Time spent: 40 minutes    Dorcas Carrow, MD [facility]

## 2023-11-02 NOTE — Plan of Care (Signed)

## 2023-11-02 NOTE — Progress Notes (Signed)
NIF -40 VC atleast 2.5L.  Pt performed with great effort.

## 2023-11-02 NOTE — TOC Progression Note (Signed)
Transition of Care [facility]) - Progression Note    Patient Details  Name: Jonathan Luna MRN: [id number] Date of Birth: 1966-04-08  Transition of [facility]) CM/SW Contact  Kermit Balo, RN Phone Number: 11/02/2023, 11:47 AM  Clinical Narrative:     Continues with PLEX.  TOC following for d/c needs.   Expected Discharge Plan: IP Rehab Facility Barriers to Discharge: Continued Medical Work up  Expected Discharge Plan and Services       Living arrangements for the past 2 months: Single Family Home                                       Social Determinants of Health (SDOH) Interventions SDOH Screenings   Food Insecurity: No Food Insecurity (10/29/2023)  Housing: Low Risk  (10/29/2023)  Transportation Needs: No Transportation Needs (10/29/2023)  Utilities: Not At Risk (10/29/2023)  Depression (PHQ2-9): Low Risk  (09/28/2023)  Financial Resource Strain: Patient Declined (05/27/2023)  Physical Activity: Insufficiently Active (05/27/2023)  Social Connections: Unknown (05/27/2023)  Stress: Stress Concern Present (05/27/2023)  Tobacco Use: Low Risk  (10/29/2023)    Readmission Risk Interventions     No data to display

## 2023-11-02 NOTE — Progress Notes (Addendum)
NEUROLOGY CONSULT FOLLOW UP NOTE   Date of service: November 02, 2023 Patient Name: Jonathan Luna MRN:  [id number] DOB:  January 24, 1966  Interval Hx/subjective   3rd round of PLEX today.  Seen up in HD unit. Strength is continuing to improve. He is wanting to look at acute inpatient rehab here.   Vitals   Vitals:   11/02/23 1117 11/02/23 1235 11/02/23 1240 11/02/23 1257  BP: 137/84  128/72 122/72  Pulse: 91  91 95  Resp: 18  16 (!) 22  Temp: 98 F (36.7 C)  97.8 F (36.6 C)   TempSrc: Oral  Oral   SpO2: 98%  96% 97%  Weight:  86 kg    Height:         Body mass index is 29.69 kg/m.  Physical Exam   Constitutional: Appears well-developed and well-nourished.  Psych: Affect appropriate to situation.  Eyes: No scleral injection.  HENT: No OP obstrucion.  Head: Normocephalic.  Cardiovascular: Normal rate and regular rhythm.  Respiratory: Effort normal, non-labored breathing.  GI: Soft.  No distension. There is no tenderness.  Skin: WDI.   Neurologic Examination   Neuro: Mental Status: Patient is awake, alert, oriented to person, place, month, year, and situation. Patient is able to give a clear and coherent history. No signs of aphasia or neglect Cranial Nerves: II: Visual Fields are full. Pupils are equal, round, and reactive to light.   III,IV, VI: EOMI without ptosis or diploplia.  V: Facial sensation is symmetric to temperature VII: Facial movement is symmetric.  VIII: hearing is intact to voice X: Uvula elevates symmetrically XI: Shoulder shrug is symmetric. XII: tongue is midline without atrophy or fasciculations.  Motor: Overall, stronger proximally than distally BUE: 4-/5 shoulder, 3/5 bicep/tricep, 3/5 grips bilaterally. (4/5 for attending at 9 PM) BLE: 4/5 hip and knee flexion, 3/5 dorsal and plantar flexion Sensory: Reduced throughout face and BUE. Reduced and tingling noted BLE, specifically from knee down. (Subjectively patient reports sensory symptoms are  improving) Deep Tendon Reflexes: Absent  Cerebellar: FNF intact bilaterally  Medications  Current Facility-Administered Medications:    acetaminophen (TYLENOL) tablet 650 mg, 650 mg, Oral, Q6H PRN, 650 mg at 10/30/23 1315 **OR** acetaminophen (TYLENOL) suppository 650 mg, 650 mg, Rectal, Q6H PRN, Synetta Fail, MD   acetaminophen (TYLENOL) tablet 650 mg, 650 mg, Oral, Q4H PRN, Rejeana Brock, MD, 650 mg at 10/31/23 1048   acetaminophen (TYLENOL) tablet 650 mg, 650 mg, Oral, Q4H PRN, Rejeana Brock, MD, 650 mg at 11/02/23 1146   albuterol (PROVENTIL) (2.5 MG/3ML) 0.083% nebulizer solution 2.5 mg, 2.5 mg, Inhalation, Q6H PRN, Synetta Fail, MD, 2.5 mg at 10/30/23 0102   calcium carbonate (TUMS - dosed in mg elemental calcium) chewable tablet 400 mg of elemental calcium, 2 tablet, Oral, Q3H, Rejeana Brock, MD, 400 mg of elemental calcium at 11/02/23 1146   calcium gluconate 2 g/ 100 mL sodium chloride IVPB, 2 g, Intravenous, Once, Rejeana Brock, MD, Last Rate: 50 mL/hr at 11/02/23 1233, 2,000 mg at 11/02/23 1233   calcium gluconate 2-0.675 GM/100ML-% IVPB, , , ,    Chlorhexidine Gluconate Cloth 2 % PADS 6 each, 6 each, Topical, Daily, Synetta Fail, MD, 6 each at 11/01/23 0930   citrate dextrose (ACD-A anticoagulant) solution 1,000 mL, 1,000 mL, Other, Continuous, Rejeana Brock, MD, 1,000 mL at 10/31/23 1143   citrate dextrose (ACD-A anticoagulant) solution 1,000 mL, 1,000 mL, Other, Continuous, Kirkpatrick, Hardin Negus, MD, 1,000 mL  at 11/02/23 1234   clonazePAM (KLONOPIN) tablet 0.5 mg, 0.5 mg, Oral, BID PRN, Synetta Fail, MD, 0.5 mg at 11/02/23 0540   DULoxetine (CYMBALTA) DR capsule 60 mg, 60 mg, Oral, Daily, Synetta Fail, MD, 60 mg at 11/02/23 0731   enoxaparin (LOVENOX) injection 40 mg, 40 mg, Subcutaneous, Q24H, Synetta Fail, MD, 40 mg at 11/01/23 1642   gabapentin (NEURONTIN) capsule 900 mg, 900 mg, Oral, TID,  Synetta Fail, MD, 900 mg at 11/02/23 0730   guaiFENesin-dextromethorphan (ROBITUSSIN DM) 100-10 MG/5ML syrup 5 mL, 5 mL, Oral, Q4H PRN, Synetta Fail, MD, 5 mL at 10/30/23 4401   heparin sodium (porcine) injection 1,000 Units, 1,000 Units, Intracatheter, Once, Rejeana Brock, MD   irbesartan (AVAPRO) tablet 300 mg, 300 mg, Oral, Daily, Synetta Fail, MD, 300 mg at 11/02/23 0272   levothyroxine (SYNTHROID) tablet 125 mcg, 125 mcg, Oral, Q0600, Synetta Fail, MD, 125 mcg at 11/02/23 0540   metoprolol succinate (TOPROL-XL) 24 hr tablet 25 mg, 25 mg, Oral, Daily, Synetta Fail, MD, 25 mg at 11/02/23 0730   nicotine polacrilex (NICORETTE) gum 2 mg, 2 mg, Oral, PRN, John Giovanni, MD, 2 mg at 10/30/23 0544   Oral care mouth rinse, 15 mL, Mouth Rinse, PRN, Synetta Fail, MD   Oxcarbazepine (TRILEPTAL) tablet 300 mg, 300 mg, Oral, BID, Synetta Fail, MD, 300 mg at 11/02/23 5366   oxyCODONE-acetaminophen (PERCOCET/ROXICET) 5-325 MG per tablet 1 tablet, 1 tablet, Oral, Q6H PRN, Dorcas Carrow, MD, 1 tablet at 11/02/23 0540   polyethylene glycol (MIRALAX / GLYCOLAX) packet 17 g, 17 g, Oral, Daily PRN, Synetta Fail, MD   predniSONE (DELTASONE) tablet 60 mg, 60 mg, Oral, Q breakfast, Richardo Priest, Erin C, NP, 60 mg at 11/02/23 0730   sertraline (ZOLOFT) tablet 25 mg, 25 mg, Oral, Daily, Synetta Fail, MD, 25 mg at 11/02/23 0729   sodium chloride flush (NS) 0.9 % injection 10-40 mL, 10-40 mL, Intracatheter, Q12H, Synetta Fail, MD, 10 mL at 11/02/23 0729   sodium chloride flush (NS) 0.9 % injection 10-40 mL, 10-40 mL, Intracatheter, PRN, Synetta Fail, MD   sodium chloride flush (NS) 0.9 % injection 3 mL, 3 mL, Intravenous, Q12H, Synetta Fail, MD, 3 mL at 11/02/23 0749  Labs and Diagnostic Imaging   CBC:  Recent Labs  Lab 10/31/23 0626 11/02/23 0551  WBC 12.5* 11.0*  NEUTROABS 7.4 4.9  HGB 15.2 13.3  HCT 43.7 38.4*  MCV  88.1 88.5  PLT 326 246    Basic Metabolic Panel:  Lab Results  Component Value Date   NA 137 11/02/2023   K 3.5 11/02/2023   CO2 26 11/02/2023   GLUCOSE 85 11/02/2023   BUN 9 11/02/2023   CREATININE 0.77 11/02/2023   CALCIUM 8.6 (L) 11/02/2023   GFRNONAA >60 11/02/2023   GFRAA 94 08/16/2008   Lipid Panel:  Lab Results  Component Value Date   LDLCALC 106 (H) 10/08/2023   HgbA1c:  Lab Results  Component Value Date   HGBA1C 5.3 09/22/2023   Urine Drug Screen:     Component Value Date/Time   LABOPIA NEG 09/29/2012 0830   COCAINSCRNUR Negative 08/18/2023 1114   COCAINSCRNUR NEG 09/29/2012 0830   LABBENZ POS (A) 09/29/2012 0830   AMPHETMU NEG 09/29/2012 0830    Alcohol Level No results found for: "ETH" INR  Lab Results  Component Value Date   INR 1.0 08/29/2023   APTT  Lab Results  Component Value Date   APTT 32 08/29/2023   AED levels: No results found for: "PHENYTOIN", "ZONISAMIDE", "LAMOTRIGINE", "LEVETIRACETA"  10/25/23 CT Head without contrast(Personally reviewed): - No acute findings  12/19 MRI Brain(Personally reviewed): - Normal brain MRI   NIF/VC  11/20- NIF -40 VC atleast 2.5L.  Pt performed with great effort.   NIF/VC 11/17 - NIF -40  Assessment   Jonathan Luna is a 58 y.o. male who has been recently diagnosed with CIDP and was recently discharged from rehab on 10/20/2023. He completed 5 days of IVIG mid-December 2024. Discharged to CIR, discharged 10/20/23. Followed up with outpatient neurologist, Dr. Terrace Arabia, who sent patient to ED due to progressive weakness and worsening symptoms. He is having trouble ambulating and doing simple tasks. He was sent from neurology office for admission and initiation of Plex treatments.  Overall weakness, stronger distally then proximal. He only had a short duration of smyptom improvement after last month's IVIG treatments.   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    Recommendations   - PLEX 1/17, 1/18, 1/20, 1/22, 1/24  - Continue 60mg  Prednisone maintenance steroids - Continue home gabapentin for neuropathic pain - Continue VC and NiF Q12H - Continue PT/OT - Encourage OOB for meals, if able.   We will continue to follow; plan to next see patient on 1/24 unless concern for acute decline given he is already improving and do not expect day to day change in neurological plan ______________________________________________________________________   > 50 min spent in care of patient, majority at bedside answering questions about his condition and formulating plan

## 2023-11-03 DIAGNOSIS — G6181 Chronic inflammatory demyelinating polyneuritis: Secondary | ICD-10-CM | POA: Diagnosis not present

## 2023-11-03 NOTE — Plan of Care (Signed)
  Problem: Education: Goal: Knowledge of General Education information will improve Description: Including pain rating scale, medication(s)/side effects and non-pharmacologic comfort measures Outcome: Progressing   Problem: Health Behavior/Discharge Planning: Goal: Ability to manage health-related needs will improve Outcome: Progressing   Problem: Clinical Measurements: Goal: Ability to maintain clinical measurements within normal limits will improve Outcome: Progressing Goal: Will remain free from infection Outcome: Progressing   Problem: Activity: Goal: Risk for activity intolerance will decrease Outcome: Progressing   Problem: Nutrition: Goal: Adequate nutrition will be maintained Outcome: Progressing   Problem: Pain Managment: Goal: General experience of comfort will improve and/or be controlled Outcome: Progressing

## 2023-11-03 NOTE — Progress Notes (Signed)
Physical Therapy Treatment Patient Details Name: Jonathan Luna MRN: [id number] DOB: Jan 16, 1966 Today's Date: 11/03/2023   History of Present Illness Pt is a 58 y/o male admitted 10/29/23  with worsening weakness and gait instability from neurology clinic. 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 To have plasma exchange therapy. Dialysis catheter RIJ placed.  PMHx  HTN, HLD, chronic pain, neuropathy, gait abnormality, CIDP    PT Comments  Pt resting in bed on arrival, reporting that he feels weaker than he did yesterday, however pt continues to demonstrate bed mobility at mod I level and requires grossly CGA for transfers and gait with RW for support.  Pt with decreased activity tolerance this session, needing seated rest during gait after 65' however pt without LOB. Continued education on appropriate activity progression, energy conservation and importance of not "over doing" it with pt verbalizing understanding. Pt continues to benefit from skilled PT services to progress toward functional mobility goals.      If plan is discharge home, recommend the following: A little help with walking and/or transfers;Assistance with cooking/housework;Assist for transportation;Help with stairs or ramp for entrance   Can travel by private vehicle        Equipment Recommendations  None recommended by PT    Recommendations for Other Services       Precautions / Restrictions Precautions Precautions: Fall Restrictions Weight Bearing Restrictions Per Provider Order: No     Mobility  Bed Mobility Overal bed mobility: Modified Independent             General bed mobility comments: HOB elevated; incr time/effort supine to sit to supine    Transfers Overall transfer level: Needs assistance Equipment used: Rolling walker (2 wheels) Transfers: Sit to/from Stand Sit to Stand: Contact guard assist           General transfer comment: vc for hand placement; no  boost required, increased time and pt blocking LEs at EOB, able to complete x3 at end of session without UE support    Ambulation/Gait Ambulation/Gait assistance: Contact guard assist (chair follow for safety as pt stating he felt more weak than previsou session) Gait Distance (Feet): 65 Feet (x2) Assistive device: Rolling walker (2 wheels) Gait Pattern/deviations: Step-through pattern, Decreased stride length, Decreased dorsiflexion - right, Decreased dorsiflexion - left, Knee hyperextension - right, Knee hyperextension - left, Steppage Gait velocity: decr     General Gait Details: using slipper socks; does not have AFOs here--asked him to have AFOs and shoes brought to hospital; vc for proximity to RW, noted tremoring in UEs with fatigue   Stairs             Wheelchair Mobility     Tilt Bed    Modified Rankin (Stroke Patients Only)       Balance Overall balance assessment: Needs assistance Sitting-balance support: No upper extremity supported, Feet unsupported Sitting balance-Leahy Scale: Good     Standing balance support: During functional activity, Reliant on assistive device for balance Standing balance-Leahy Scale: Poor Standing balance comment: relaint on external support                            Cognition Arousal: Alert Behavior During Therapy: WFL for tasks assessed/performed Overall Cognitive Status: Within Functional Limits for tasks assessed  General Comments: decr awareness that his overexertion can cause him to deteriorate        Exercises Other Exercises Other Exercises: serial sit<>stand x3    General Comments        Pertinent Vitals/Pain Pain Assessment Pain Assessment: Faces Faces Pain Scale: Hurts a little bit Pain Location: low back Pain Descriptors / Indicators: Discomfort Pain Intervention(s): Monitored during session, Limited activity within patient's tolerance     Home Living                          Prior Function            PT Goals (current goals can now be found in the care plan section) Acute Rehab PT Goals Patient Stated Goal: regain the strength he has lost PT Goal Formulation: With patient Time For Goal Achievement: 11/15/23 Progress towards PT goals: Progressing toward goals    Frequency    Min 1X/week      PT Plan      Co-evaluation              AM-PAC PT "6 Clicks" Mobility   Outcome Measure  Help needed turning from your back to your side while in a flat bed without using bedrails?: None Help needed moving from lying on your back to sitting on the side of a flat bed without using bedrails?: None Help needed moving to and from a bed to a chair (including a wheelchair)?: A Little Help needed standing up from a chair using your arms (e.g., wheelchair or bedside chair)?: A Little Help needed to walk in hospital room?: A Little Help needed climbing 3-5 steps with a railing? : Total 6 Click Score: 18    End of Session Equipment Utilized During Treatment: Gait belt Activity Tolerance: Patient limited by fatigue Patient left: in bed;with call bell/phone within reach (with bed in chair position) Nurse Communication: Mobility status PT Visit Diagnosis: Other abnormalities of gait and mobility (R26.89);Muscle weakness (generalized) (M62.81);History of falling (Z91.81)     Time: 8416-6063 PT Time Calculation (min) (ACUTE ONLY): 26 min  Charges:    $Gait Training: 8-22 mins $Therapeutic Exercise: 8-22 mins PT General Charges $$ ACUTE PT VISIT: 1 Visit                     Ajane Novella R. PTA Acute Rehabilitation Services Office: [phone]   Catalina Antigua 11/03/2023, 12:44 PM

## 2023-11-03 NOTE — Progress Notes (Signed)
Per Dr. Jerral Ralph, patient permitted to have nicotine pouches at bedside for use.

## 2023-11-03 NOTE — Plan of Care (Signed)

## 2023-11-03 NOTE — Progress Notes (Signed)
PROGRESS NOTE    Jonathan Luna  UYQ:[id number] DOB: [birth year] DOA: 10/29/2023 PCP: [name], MD    Brief Narrative:  58 year old with history of hypertension, hyperlipidemia, hypothyroidism and anxiety , neuropathy and CIDP presented with worsening weakness and gait instability from neurology clinic.  Patient was in the hospital last month with similar symptoms and treated with IVIG.  Reported worsening symptoms since last 2 weeks.  Also with upper extremity numbness and weakness.  In the emergency room hemodynamically stable.  Admitted for plasma exchange.  Subjective:  Patient seen and examined.  No overnight events.  Strength is improving.  He is worried about outpatient rehab that was recommended because his wife is blind and he is not able to drive.   Assessment & Plan:   CIDP: Recurrent weakness and gait abnormality.  Admitted for plasma exchange.  Continue respiratory therapy evaluation, NIF and vital capacity.  All supportive care available.  Stable so far. IR placed temporary catheter right internal jugular.  Plasma exchange 1/17, 1/18, 1/20. Next plasmapheresis will be 1/22, 1/24.  Started on high-dose steroids.  Neurology following.  Continue to work with PT OT. Will need ongoing therapies.  Hyponatremia: Due to dehydration.  Improved with isotonic fluid.  Hypokalemia: Replaced with improvement.  Magnesium adequate.  Phosphorus adequate.  Hypocalcemia: Adequate.  On Tums.  Hypertension: Blood pressure is stable on irbesartan and metoprolol. Hypothyroidism: On thyroid supplementation. Anxiety disorder: On duloxetine, sertraline and as needed Klonopin. Neuropathy: On gabapentin Trileptal continued. Chronic pain syndrome: As above on Cymbalta gabapentin and as needed oxycodone.  Nicotine use: Patient with significant nicotine use.  wants to use his nicotine pouches, will allow to use it.   DVT prophylaxis: enoxaparin (LOVENOX) injection 40 mg Start: 10/29/23  1800   Code Status: Full code Family Communication: None today. Disposition Plan: Status is: Inpatient Remains inpatient appropriate because: Active inpatient treatment     Consultants:  Neurology  Procedures:  Plasma exchange  Antimicrobials:  None     Objective: Vitals:   11/03/23 0013 11/03/23 0404 11/03/23 0726 11/03/23 1114  BP: 131/76 123/80 133/64 (!) 139/90  Pulse: 75 71 79 (!) 101  Resp: 17 17 17 18   Temp: 98.2 F (36.8 C) 97.8 F (36.6 C) 98.2 F (36.8 C) (!) 97.5 F (36.4 C)  TempSrc:   Oral Oral  SpO2: 98% 100% 100% 92%  Weight:      Height:        Intake/Output Summary (Last 24 hours) at 11/03/2023 1137 Last data filed at 11/03/2023 0950 Gross per 24 hour  Intake 86.79 ml  Output 1600 ml  Net -1513.21 ml    Filed Weights   11/01/23 0437 11/02/23 0757 11/02/23 1235  Weight: 82.6 kg 86 kg 86 kg    Examination:  General exam: Looks fairly comfortable.  Interactive.  On room air. Respiratory system: No added sounds. Cardiovascular system: S1 & S2 heard, RRR.  No pedal edema. Gastrointestinal system: Abdomen is nondistended, soft and nontender. No organomegaly or masses felt. Normal bowel sounds heard. Central nervous system: Alert and oriented.  Pleasant interaction. Both upper extremity with improved weakness.  Proximal muscle groups are almost 4/5.  Distal muscle group including wrist extension 2/5.   lower extremities weakness, 4/5.  Diminished sensation both arms and legs.   Data Reviewed: I have personally reviewed following labs and imaging studies  CBC: Recent Labs  Lab 10/29/23 1526 10/30/23 0555 10/31/23 0626 11/02/23 0551  WBC 12.8* 9.2 12.5* 11.0*  NEUTROABS  7.4  --  7.4 4.9  HGB 16.1 14.9 15.2 13.3  HCT 46.0 42.6 43.7 38.4*  MCV 88.5 87.5 88.1 88.5  PLT 425* 333 326 246   Basic Metabolic Panel: Recent Labs  Lab 10/29/23 1526 10/29/23 2229 10/30/23 0555 10/31/23 0626 11/02/23 0551  NA 127* 128* 130* 135 137  K  4.0 3.1* 3.4* 3.9 3.5  CL 92* 93* 94* 104 104  CO2 21* 24 25 24 26   GLUCOSE 93 122* 99 103* 85  BUN 22* 21* 16 8 9   CREATININE 0.88 0.82 0.68 0.66 0.77  CALCIUM 9.8 9.1 9.0 9.0 8.6*  MG  --   --   --  1.9 2.1  PHOS  --   --   --  2.9 3.6   GFR: Estimated Creatinine Clearance: 106.8 mL/min (by C-G formula based on SCr of 0.77 mg/dL). Liver Function Tests: Recent Labs  Lab 10/30/23 0555 10/31/23 0626 11/02/23 0551  AST 18 16 16   ALT 22 15 14   ALKPHOS 57 27* 22*  BILITOT 0.8 0.8 0.6  PROT 7.2 6.0* 5.2*  ALBUMIN 3.7 4.2 3.6   No results for input(s): "LIPASE", "AMYLASE" in the last 168 hours. No results for input(s): "AMMONIA" in the last 168 hours. Coagulation Profile: No results for input(s): "INR", "PROTIME" in the last 168 hours. Cardiac Enzymes: No results for input(s): "CKTOTAL", "CKMB", "CKMBINDEX", "TROPONINI" in the last 168 hours. BNP (last 3 results) Recent Labs    09/04/23 1021  PROBNP 6.0   HbA1C: No results for input(s): "HGBA1C" in the last 72 hours. CBG: No results for input(s): "GLUCAP" in the last 168 hours. Lipid Profile: No results for input(s): "CHOL", "HDL", "LDLCALC", "TRIG", "CHOLHDL", "LDLDIRECT" in the last 72 hours. Thyroid Function Tests: No results for input(s): "TSH", "T4TOTAL", "FREET4", "T3FREE", "THYROIDAB" in the last 72 hours. Anemia Panel: No results for input(s): "VITAMINB12", "FOLATE", "FERRITIN", "TIBC", "IRON", "RETICCTPCT" in the last 72 hours. Sepsis Labs: No results for input(s): "PROCALCITON", "LATICACIDVEN" in the last 168 hours.  Recent Results (from the past 240 hours)  Resp panel by RT-PCR (RSV, Flu A&B, Covid) Anterior Nasal Swab     Status: None   Collection Time: 10/25/23  1:24 PM   Specimen: Anterior Nasal Swab  Result Value Ref Range Status   SARS Coronavirus 2 by RT PCR NEGATIVE NEGATIVE Final   Influenza A by PCR NEGATIVE NEGATIVE Final   Influenza B by PCR NEGATIVE NEGATIVE Final    Comment: (NOTE) The  Xpert Xpress SARS-CoV-2/FLU/RSV plus assay is intended as an aid in the diagnosis of influenza from Nasopharyngeal swab specimens and should not be used as a sole basis for treatment. Nasal washings and aspirates are unacceptable for Xpert Xpress SARS-CoV-2/FLU/RSV testing.  Fact Sheet for Patients: BloggerCourse.com  Fact Sheet for Healthcare Providers: SeriousBroker.it  This test is not yet approved or cleared by the [place] FDA and has been authorized for detection and/or diagnosis of SARS-CoV-2 by FDA under an Emergency Use Authorization (EUA). This EUA will remain in effect (meaning this test can be used) for the duration of the COVID-19 declaration under Section 564(b)(1) of the Act, 21 U.S.C. section 360bbb-3(b)(1), unless the authorization is terminated or revoked.     Resp Syncytial Virus by PCR NEGATIVE NEGATIVE Final    Comment: (NOTE) Fact Sheet for Patients: BloggerCourse.com  Fact Sheet for Healthcare Providers: SeriousBroker.it  This test is not yet approved or cleared by the [place] FDA and has been authorized for detection and/or diagnosis  of SARS-CoV-2 by FDA under an Emergency Use Authorization (EUA). This EUA will remain in effect (meaning this test can be used) for the duration of the COVID-19 declaration under Section 564(b)(1) of the Act, 21 U.S.C. section 360bbb-3(b)(1), unless the authorization is terminated or revoked.  Performed at [facility], [address] [place], [place] [address]          Radiology Studies: No results found.       Scheduled Meds:  Chlorhexidine Gluconate Cloth  6 each Topical Daily   DULoxetine  60 mg Oral Daily   enoxaparin (LOVENOX) injection  40 mg Subcutaneous Q24H   gabapentin  900 mg Oral TID   irbesartan  300 mg Oral Daily   levothyroxine  125 mcg Oral Q0600   metoprolol succinate  25  mg Oral Daily   predniSONE  60 mg Oral Q breakfast   sertraline  25 mg Oral Daily   sodium chloride flush  10-40 mL Intracatheter Q12H   sodium chloride flush  3 mL Intravenous Q12H   Continuous Infusions:  citrate dextrose     citrate dextrose       LOS: 5 days    Time spent: 40 minutes    Dorcas Carrow, MD [facility]

## 2023-11-03 NOTE — Progress Notes (Signed)
NIF -40 (VC not available) IS 2500

## 2023-11-03 NOTE — Progress Notes (Signed)
Occupational Therapy Treatment Patient Details Name: Jonathan Luna MRN: [id number] DOB: 1966/08/11 Today's Date: 11/03/2023   History of present illness Pt is a 58 y/o male admitted 10/29/23  with worsening weakness and gait instability from neurology clinic. 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 To have plasma exchange therapy. Dialysis catheter RIJ placed.  PMHx  HTN, HLD, chronic pain, neuropathy, gait abnormality, CIDP   OT comments  Pt seen for OT session with focus on seated ADLs and therex for BUE. Pt able to complete oral care task with R wrist support cuff donned and up to min A. Pt educated on BUE exercises and administered theraputty (tan), bil wrist support cuffs, and weighted utensil for pt to use. Pt educated on importance of pacing self and not "overdoing" activity that can result in fatigue. Pt presenting with impairments listed below, will follow acutely. Patient will benefit from intensive inpatient follow up therapy, >3 hours/day to maximize safety/ind with ADL/functional mobility, if unable will need OP OT.       If plan is discharge home, recommend the following:  A little help with walking and/or transfers;A lot of help with bathing/dressing/bathroom;Assistance with cooking/housework;Assist for transportation;Help with stairs or ramp for entrance   Equipment Recommendations  BSC/3in1;Other (comment) (RW)    Recommendations for Other Services PT consult;Rehab consult    Precautions / Restrictions Precautions Precautions: Fall Restrictions Weight Bearing Restrictions Per Provider Order: No       Mobility Bed Mobility Overal bed mobility: Modified Independent                  Transfers                   General transfer comment: session focused on UE exercises and seated ADL     Balance Overall balance assessment: Needs assistance Sitting-balance support: No upper extremity supported, Feet  unsupported Sitting balance-Leahy Scale: Good Sitting balance - Comments: sits unsupported EOB                                   ADL either performed or assessed with clinical judgement   ADL Overall ADL's : Needs assistance/impaired Eating/Feeding: Set up;With adaptive utensils;With assist to don/doff brace/orthosis Eating/Feeding Details (indicate cue type and reason): weighted fork Grooming: Minimal assistance;Oral care;Wash/dry face Grooming Details (indicate cue type and reason): using wrist support cuff to brush teeth with RUE                               General ADL Comments: session focused on UE exercises and seated ADL    Extremity/Trunk Assessment Upper Extremity Assessment Upper Extremity Assessment: RUE deficits/detail;Right hand dominant RUE Deficits / Details: stronger proximally vs distally, 3+/5 shoulder/elbow ROM, 2/5 grasp, minimal wrist extension noted, tremoring noted, can abd/add digits, can extend thumb with hand pronated, can oppose all digits except to pinky RUE Coordination: decreased fine motor;decreased gross motor LUE Deficits / Details: stronger proximally vs distally, 3+/5 shoulder/elbow ROM, 2/5 grasp, minimal wrist extension noted, tremoring noted, can oppose all digits, decr digit extension   Lower Extremity Assessment Lower Extremity Assessment: Defer to PT evaluation        Vision   Vision Assessment?: No apparent visual deficits   Perception Perception Perception: Not tested   Praxis Praxis Praxis: Not tested    Cognition Arousal:  Alert Behavior During Therapy: WFL for tasks assessed/performed Overall Cognitive Status: Within Functional Limits for tasks assessed                                          Exercises Exercises: Other exercises, Hand exercises, Hand activities Hand Exercises Digit Composite Flexion: AROM, Both, 10 reps, Seated Composite Extension: AROM, Both, 10 reps,  Seated Digit Composite Abduction: AROM, Both, 10 reps, Seated Digit Composite Adduction: AROM, Both, 10 reps, Seated Digit Lifts Limitations: only able to lift R thumb with hand pronated Other Exercises Other Exercises: pressing thumb in theraputty x10 Other Exercises: theraputty pull apart x10 Other Exercises: theraputty into a ball x10 Other Exercises: picking up/putting down objects x10 BUE    Shoulder Instructions       General Comments discussed importance of activity pacing and "overdoing" and fatigue    Pertinent Vitals/ Pain       Pain Assessment Pain Assessment: No/denies pain  Home Living                                          Prior Functioning/Environment              Frequency  Min 1X/week        Progress Toward Goals  OT Goals(current goals can now be found in the care plan section)  Progress towards OT goals: Progressing toward goals  Acute Rehab OT Goals Patient Stated Goal: to get better OT Goal Formulation: With patient Time For Goal Achievement: 11/15/23 Potential to Achieve Goals: Good ADL Goals Pt Will Perform Upper Body Dressing: with supervision;sitting Pt Will Perform Lower Body Dressing: with supervision;sit to/from stand;sitting/lateral leans Pt Will Transfer to Toilet: with supervision;ambulating;regular height toilet Pt Will Perform Tub/Shower Transfer: with supervision;Tub transfer;Shower transfer;ambulating;tub bench Pt/caregiver will Perform Home Exercise Program: Both right and left upper extremity;With Supervision;With written HEP provided  Plan      Co-evaluation                 AM-PAC OT "6 Clicks" Daily Activity     Outcome Measure   Help from another person eating meals?: A Little Help from another person taking care of personal grooming?: A Little Help from another person toileting, which includes using toliet, bedpan, or urinal?: A Little Help from another person bathing (including  washing, rinsing, drying)?: A Lot Help from another person to put on and taking off regular upper body clothing?: A Lot Help from another person to put on and taking off regular lower body clothing?: A Lot 6 Click Score: 15    End of Session Equipment Utilized During Treatment: Gait belt;Rolling walker (2 wheels)  OT Visit Diagnosis: Unsteadiness on feet (R26.81);Other abnormalities of gait and mobility (R26.89);Muscle weakness (generalized) (M62.81)   Activity Tolerance Patient tolerated treatment well   Patient Left in bed;with call bell/phone within reach;with bed alarm set   Nurse Communication Mobility status        Time: 1530-1620 OT Time Calculation (min): 50 min  Charges: OT General Charges $OT Visit: 1 Visit OT Treatments $Self Care/Home Management : 8-22 mins $Therapeutic Activity: 8-22 mins $Therapeutic Exercise: 8-22 mins  Carver Fila, OTD, OTR/L SecureChat Preferred Acute Rehab [phone]   Carver Fila Koonce 11/03/2023, 4:34 PM

## 2023-11-04 ENCOUNTER — Ambulatory Visit: Payer: 59 | Admitting: Neurology

## 2023-11-04 DIAGNOSIS — G6181 Chronic inflammatory demyelinating polyneuritis: Secondary | ICD-10-CM | POA: Diagnosis not present

## 2023-11-04 LAB — BASIC METABOLIC PANEL
Anion gap: 9 (ref 5–15)
BUN: 11 mg/dL (ref 6–20)
CO2: 27 mmol/L (ref 22–32)
Calcium: 8.5 mg/dL — ABNORMAL LOW (ref 8.9–10.3)
Chloride: 102 mmol/L (ref 98–111)
Creatinine, Ser: 0.88 mg/dL (ref 0.61–1.24)
GFR, Estimated: >60 mL/min (ref >60)
Glucose, Bld: 115 mg/dL — ABNORMAL HIGH (ref 70–99)
Potassium: 3.1 mmol/L — ABNORMAL LOW (ref 3.5–5.1)
Sodium: 138 mmol/L (ref 135–145)

## 2023-11-04 LAB — CBC WITH DIFFERENTIAL/PLATELET
Abs Immature Granulocytes: 0.06 10*3/uL (ref 0.00–0.07)
Basophils Absolute: 0.1 10*3/uL (ref 0.0–0.1)
Basophils Relative: 1 %
Eosinophils Absolute: 0.5 10*3/uL (ref 0.0–0.5)
Eosinophils Relative: 4 %
HCT: 37.2 % — ABNORMAL LOW (ref 39.0–52.0)
Hemoglobin: 13.2 g/dL (ref 13.0–17.0)
Immature Granulocytes: 1 %
Lymphocytes Relative: 38 %
Lymphs Abs: 5 10*3/uL — ABNORMAL HIGH (ref 0.7–4.0)
MCH: 31.6 pg (ref 26.0–34.0)
MCHC: 35.5 g/dL (ref 30.0–36.0)
MCV: 89 fL (ref 80.0–100.0)
Monocytes Absolute: 0.7 10*3/uL (ref 0.1–1.0)
Monocytes Relative: 6 %
Neutro Abs: 6.8 10*3/uL (ref 1.7–7.7)
Neutrophils Relative %: 50 %
Platelets: 275 10*3/uL (ref 150–400)
RBC: 4.18 MIL/uL — ABNORMAL LOW (ref 4.22–5.81)
RDW: 12.6 % (ref 11.5–15.5)
WBC: 13.2 10*3/uL — ABNORMAL HIGH (ref 4.0–10.5)
nRBC: 0 % (ref 0.0–0.2)

## 2023-11-04 MED ORDER — DM-GUAIFENESIN ER 30-600 MG PO TB12
1.0000 | ORAL_TABLET | Freq: Two times a day (BID) | ORAL | Status: DC
  Administered 2023-11-04 – 2023-11-10 (×7): 1 via ORAL
  Filled 2023-11-04 (×13): qty 1

## 2023-11-04 MED ORDER — DIPHENHYDRAMINE HCL 25 MG PO CAPS
25.0000 mg | ORAL_CAPSULE | Freq: Four times a day (QID) | ORAL | Status: DC | PRN
  Administered 2023-11-04 – 2023-11-05 (×2): 25 mg via ORAL
  Filled 2023-11-04 (×2): qty 1

## 2023-11-04 MED ORDER — HEPARIN SODIUM (PORCINE) 1000 UNIT/ML IJ SOLN
INTRAMUSCULAR | Status: AC
  Administered 2023-11-04: 1000 [IU]
  Filled 2023-11-04: qty 3

## 2023-11-04 MED ORDER — ACD FORMULA A 0.73-2.45-2.2 GM/100ML VI SOLN
1000.0000 mL | Status: DC
  Administered 2023-11-04: 1000 mL
  Filled 2023-11-04: qty 1000

## 2023-11-04 MED ORDER — ASPIRIN 81 MG PO TBEC
81.0000 mg | DELAYED_RELEASE_TABLET | Freq: Every day | ORAL | Status: DC
  Administered 2023-11-04 – 2023-11-10 (×7): 81 mg via ORAL
  Filled 2023-11-04 (×7): qty 1

## 2023-11-04 MED ORDER — SODIUM CHLORIDE 0.9 % IV SOLN
INTRAVENOUS | Status: AC
  Filled 2023-11-04 (×3): qty 200

## 2023-11-04 MED ORDER — POTASSIUM CHLORIDE CRYS ER 20 MEQ PO TBCR
40.0000 meq | EXTENDED_RELEASE_TABLET | Freq: Two times a day (BID) | ORAL | Status: AC
  Administered 2023-11-04 – 2023-11-05 (×4): 40 meq via ORAL
  Filled 2023-11-04 (×4): qty 2

## 2023-11-04 MED ORDER — CALCIUM CARBONATE ANTACID 500 MG PO CHEW
1.0000 | CHEWABLE_TABLET | Freq: Two times a day (BID) | ORAL | Status: DC | PRN
  Administered 2023-11-04 – 2023-11-05 (×2): 200 mg via ORAL
  Filled 2023-11-04 (×2): qty 1

## 2023-11-04 MED ORDER — CALCIUM CARBONATE ANTACID 500 MG PO CHEW
2.0000 | CHEWABLE_TABLET | ORAL | Status: AC
  Administered 2023-11-04: 400 mg via ORAL
  Filled 2023-11-04: qty 2

## 2023-11-04 MED ORDER — HEPARIN SODIUM (PORCINE) 1000 UNIT/ML IJ SOLN
1000.0000 [IU] | Freq: Once | INTRAMUSCULAR | Status: AC
  Filled 2023-11-04: qty 1

## 2023-11-04 MED ORDER — ACETAMINOPHEN 325 MG PO TABS
650.0000 mg | ORAL_TABLET | ORAL | Status: DC | PRN
Start: 2023-11-04 — End: 2023-11-04

## 2023-11-04 MED ORDER — SODIUM CHLORIDE 0.9 % IV SOLN
Freq: Once | INTRAVENOUS | Status: DC
  Filled 2023-11-04: qty 200

## 2023-11-04 MED ORDER — CALCIUM GLUCONATE-NACL 2-0.675 GM/100ML-% IV SOLN
2.0000 g | Freq: Once | INTRAVENOUS | Status: AC
  Administered 2023-11-04: 2000 mg via INTRAVENOUS
  Filled 2023-11-04 (×2): qty 100

## 2023-11-04 NOTE — Progress Notes (Signed)
TPEReceived patient in bed to unit.  Alert and oriented.  Informed consent signed and in chart.   TX duration:  Patient tolerated well.  Transported back to the room  Alert, without acute distress.  Hand-off given to patient's nurse.   Access used: Yes Access issues: No  Total UF removed: 3778 Medication(s) given: See MAR Post HD VS: See Below Grid Post HD weight: 83.4 kg   [facility]  11/04/23 1449  Vitals  Temp 98.4 F (36.9 C)  Temp Source Oral  Pulse Rate 92  ECG Heart Rate 96  Resp 20  BP 125/85  Oxygen Therapy  SpO2 95 %  O2 Device Room Air  Apheresis   Procedure Comments Tx. Completed. Pt. voice no complaints. VSS and report call to 3W bedside RN  Post-apheresis  Net Removed (mL) 3788 mL  Replacement (mL) 2888 mL  ACDA infused (mL) 749 mL  Total Normal Saline Administered 163 mL  Total Calcium Administered 100 mL  Tolerated Procedure Yes  Post-Procedure Comments Tx. went well and instructions given to pt. to return back for tx. on Friday 24, 2025  Hemodialysis Catheter Right Internal jugular Triple lumen Temporary (Non-Tunneled)  Placement Date/Time: 10/29/23 1718   Serial / Lot #: [id number]  Expiration Date: 04/11/28  Time Out: Correct patient;Correct site;Correct procedure  Maximum sterile barrier precautions: Hand hygiene;Cap;Mask;Sterile gown;Sterile gloves;Large sterile s...  Site Condition No complications  Blue Lumen Status Flushed;Dead end cap in place;Heparin locked;Blood return noted  Red Lumen Status Flushed;Dead end cap in place;Heparin locked;Blood return noted  Purple Lumen Status Saline locked  Catheter fill solution Heparin 1000 units/ml  Catheter fill volume (Arterial) 1.3 cc  Catheter fill volume (Venous) 1.3  Dressing Type Transparent  Dressing Status Antimicrobial disc/dressing in place;Clean, Dry, Intact  Drainage Description None  Dressing Change Due 11/05/23  Post treatment catheter status Capped and  Clamped

## 2023-11-04 NOTE — Progress Notes (Signed)
Physical Therapy Treatment Patient Details Name: Jonathan Luna MRN: [id number] DOB: [birth year] Today's Date: 11/04/2023   History of Present Illness Pt is a 58 y/o male admitted 10/29/23  with worsening weakness and gait instability from neurology clinic. 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 To have plasma exchange therapy. Dialysis catheter RIJ placed.  PMHx  HTN, HLD, chronic pain, neuropathy, gait abnormality, CIDP    PT Comments  Pt resting in bed on arrival and eager for mobility with continued progress towards acute goals. Pt progressing gait distance and demonstrating improved stability and LE clearance with AFOs donned. Pt continues to require cues for safety and self pacing as pt pushing RW anterior to self, attempting to stand before this PTA ready and needing cues for standing rest as pt with noted tremoring in Ues. Continued education on AFO wear and proper fit in shoes as well as appropriate activity progression and not "overdoing" activity. Pt continues to benefit from skilled PT services to progress toward functional mobility goals.     If plan is discharge home, recommend the following: A little help with walking and/or transfers;Assistance with cooking/housework;Assist for transportation;Help with stairs or ramp for entrance   Can travel by private vehicle        Equipment Recommendations  None recommended by PT    Recommendations for Other Services       Precautions / Restrictions Precautions Precautions: Fall Restrictions Weight Bearing Restrictions Per Provider Order: No     Mobility  Bed Mobility Overal bed mobility: Modified Independent             General bed mobility comments: HOB elevated; incr time/effort supine to sit to supine    Transfers Overall transfer level: Needs assistance Equipment used: Rolling walker (2 wheels) Transfers: Sit to/from Stand Sit to Stand: Contact guard assist           General  transfer comment: CGA for safety, from EOB and standard height chair    Ambulation/Gait Ambulation/Gait assistance: Contact guard assist Gait Distance (Feet): 40 Feet (+ 130') Assistive device: Rolling walker (2 wheels) Gait Pattern/deviations: Step-through pattern, Decreased stride length, Decreased dorsiflexion - right, Decreased dorsiflexion - left, Knee hyperextension - right, Knee hyperextension - left, Steppage Gait velocity: decr     General Gait Details: using slipper socks for firsdt 40' then pt reporting his wife brought AFOs. AFOs donned with improved LE clearance and improved general stability; vc for proximity to RW, noted tremoring in UEs with fatigue   Stairs             Wheelchair Mobility     Tilt Bed    Modified Rankin (Stroke Patients Only)       Balance Overall balance assessment: Needs assistance Sitting-balance support: No upper extremity supported, Feet unsupported Sitting balance-Leahy Scale: Good Sitting balance - Comments: sits unsupported EOB   Standing balance support: During functional activity, Reliant on assistive device for balance Standing balance-Leahy Scale: Poor Standing balance comment: relaint on external support                            Cognition Arousal: Alert Behavior During Therapy: WFL for tasks assessed/performed Overall Cognitive Status: Within Functional Limits for tasks assessed                                 General Comments: decr  awareness that his overexertion can cause him to deteriorate        Exercises      General Comments General comments (skin integrity, edema, etc.): discussed importance of activity pacing and "overdoing" and fatigue      Pertinent Vitals/Pain Pain Assessment Pain Assessment: No/denies pain Pain Intervention(s): Monitored during session    Home Living                          Prior Function            PT Goals (current goals can now  be found in the care plan section) Acute Rehab PT Goals Patient Stated Goal: regain the strength he has lost PT Goal Formulation: With patient Time For Goal Achievement: 11/15/23 Progress towards PT goals: Progressing toward goals    Frequency    Min 1X/week      PT Plan      Co-evaluation              AM-PAC PT "6 Clicks" Mobility   Outcome Measure  Help needed turning from your back to your side while in a flat bed without using bedrails?: None Help needed moving from lying on your back to sitting on the side of a flat bed without using bedrails?: None Help needed moving to and from a bed to a chair (including a wheelchair)?: A Little Help needed standing up from a chair using your arms (e.g., wheelchair or bedside chair)?: A Little Help needed to walk in hospital room?: A Little Help needed climbing 3-5 steps with a railing? : Total 6 Click Score: 18    End of Session Equipment Utilized During Treatment: Gait belt Activity Tolerance: Patient limited by fatigue Patient left: with call bell/phone within reach;in chair Nurse Communication: Mobility status PT Visit Diagnosis: Other abnormalities of gait and mobility (R26.89);Muscle weakness (generalized) (M62.81);History of falling (Z91.81)     Time: 1045-1110 PT Time Calculation (min) (ACUTE ONLY): 25 min  Charges:    $Gait Training: 23-37 mins PT General Charges $$ ACUTE PT VISIT: 1 Visit                     Phillip Maffei R. PTA Acute Rehabilitation Services Office: [phone]   Catalina Antigua 11/04/2023, 12:04 PM

## 2023-11-04 NOTE — Progress Notes (Signed)
PROGRESS NOTE    Jonathan Luna  NWG:[id number] DOB: 20-Aug-1966 DOA: 10/29/2023 PCP: [name], MD    Brief Narrative:  58 year old with history of hypertension, hyperlipidemia, hypothyroidism and anxiety , neuropathy and CIDP presented with worsening weakness and gait instability from neurology clinic.  Patient was in the hospital last month with similar symptoms and treated with IVIG.  Reported worsening symptoms since last 2 weeks.  Also with upper extremity numbness and weakness.  In the emergency room hemodynamically stable.  Admitted for plasma exchange.  Subjective:  Patient seen and examined.  No overnight events.  He is working with therapies to be ready to go home after the procedure on Friday.   Assessment & Plan:   CIDP: Recurrent weakness and gait abnormality.  Admitted for plasma exchange.  Continue respiratory therapy evaluation, NIF and vital capacity.  All supportive care available.  Stable so far. IR placed temporary catheter right internal jugular.  Plasma exchange 1/17, 1/18, 1/20. Next plasmapheresis will be 1/22, 1/24.  Started on high-dose steroids.  Neurology following.  Continue to work with PT OT. Will need ongoing therapies.  Inpatient versus outpatient.  Hyponatremia: Due to dehydration.  Improved with isotonic fluid.  Hypokalemia: Replace further today.  The bedside today.  Still  Hypocalcemia: Adequate.  On Tums.  Hypertension: Blood pressure is stable on irbesartan and metoprolol. Hypothyroidism: On thyroid supplementation. Anxiety disorder: On duloxetine, sertraline and as needed Klonopin. Neuropathy: On gabapentin Trileptal continued. Chronic pain syndrome: As above on Cymbalta gabapentin and as needed oxycodone.  Nicotine use: Patient with significant nicotine use.  wants to use his nicotine pouches, will allow to use it.   DVT prophylaxis: enoxaparin (LOVENOX) injection 40 mg Start: 10/29/23 1800   Code Status: Full code Family  Communication: None today. Disposition Plan: Status is: Inpatient Remains inpatient appropriate because: Active inpatient treatment     Consultants:  Neurology  Procedures:  Plasma exchange  Antimicrobials:  None     Objective: Vitals:   11/03/23 2349 11/04/23 0600 11/04/23 0821 11/04/23 1043  BP: 126/71 (!) 146/74  130/70  Pulse: 71 80  85  Resp: 18   18  Temp: (!) 97.5 F (36.4 C) 97.8 F (36.6 C)  98.3 F (36.8 C)  TempSrc: Oral Oral  Oral  SpO2: 98%   98%  Weight:   83.4 kg   Height:        Intake/Output Summary (Last 24 hours) at 11/04/2023 1118 Last data filed at 11/03/2023 1557 Gross per 24 hour  Intake --  Output 700 ml  Net -700 ml    Filed Weights   11/02/23 0757 11/02/23 1235 11/04/23 0821  Weight: 86 kg 86 kg 83.4 kg    Examination:  General exam: Calm and comfortable. Respiratory system: No added sounds. Cardiovascular system: S1 & S2 heard, RRR.  No pedal edema. Gastrointestinal system: Abdomen is nondistended, soft and nontender. No organomegaly or masses felt. Normal bowel sounds heard.   Central nervous system: Alert and oriented.  Pleasant interaction. Both upper extremity with improved weakness.  Proximal muscle groups are almost 4/5.  Distal muscle group including wrist extension 2/5.   lower extremities weakness, 4/5.   Data Reviewed: I have personally reviewed following labs and imaging studies  CBC: Recent Labs  Lab 10/29/23 1526 10/30/23 0555 10/31/23 0626 11/02/23 0551 11/04/23 0850  WBC 12.8* 9.2 12.5* 11.0* 13.2*  NEUTROABS 7.4  --  7.4 4.9 6.8  HGB 16.1 14.9 15.2 13.3 13.2  HCT 46.0  42.6 43.7 38.4* 37.2*  MCV 88.5 87.5 88.1 88.5 89.0  PLT 425* 333 326 246 275   Basic Metabolic Panel: Recent Labs  Lab 10/29/23 2229 10/30/23 0555 10/31/23 0626 11/02/23 0551 11/04/23 0850  NA 128* 130* 135 137 138  K 3.1* 3.4* 3.9 3.5 3.1*  CL 93* 94* 104 104 102  CO2 24 25 24 26 27   GLUCOSE 122* 99 103* 85 115*  BUN 21* 16  8 9 11   CREATININE 0.82 0.68 0.66 0.77 0.88  CALCIUM 9.1 9.0 9.0 8.6* 8.5*  MG  --   --  1.9 2.1  --   PHOS  --   --  2.9 3.6  --    GFR: Estimated Creatinine Clearance: 95.6 mL/min (by C-G formula based on SCr of 0.88 mg/dL). Liver Function Tests: Recent Labs  Lab 10/30/23 0555 10/31/23 0626 11/02/23 0551  AST 18 16 16   ALT 22 15 14   ALKPHOS 57 27* 22*  BILITOT 0.8 0.8 0.6  PROT 7.2 6.0* 5.2*  ALBUMIN 3.7 4.2 3.6   No results for input(s): "LIPASE", "AMYLASE" in the last 168 hours. No results for input(s): "AMMONIA" in the last 168 hours. Coagulation Profile: No results for input(s): "INR", "PROTIME" in the last 168 hours. Cardiac Enzymes: No results for input(s): "CKTOTAL", "CKMB", "CKMBINDEX", "TROPONINI" in the last 168 hours. BNP (last 3 results) Recent Labs    09/04/23 1021  PROBNP 6.0   HbA1C: No results for input(s): "HGBA1C" in the last 72 hours. CBG: No results for input(s): "GLUCAP" in the last 168 hours. Lipid Profile: No results for input(s): "CHOL", "HDL", "LDLCALC", "TRIG", "CHOLHDL", "LDLDIRECT" in the last 72 hours. Thyroid Function Tests: No results for input(s): "TSH", "T4TOTAL", "FREET4", "T3FREE", "THYROIDAB" in the last 72 hours. Anemia Panel: No results for input(s): "VITAMINB12", "FOLATE", "FERRITIN", "TIBC", "IRON", "RETICCTPCT" in the last 72 hours. Sepsis Labs: No results for input(s): "PROCALCITON", "LATICACIDVEN" in the last 168 hours.  Recent Results (from the past 240 hours)  Resp panel by RT-PCR (RSV, Flu A&B, Covid) Anterior Nasal Swab     Status: None   Collection Time: 10/25/23  1:24 PM   Specimen: Anterior Nasal Swab  Result Value Ref Range Status   SARS Coronavirus 2 by RT PCR NEGATIVE NEGATIVE Final   Influenza A by PCR NEGATIVE NEGATIVE Final   Influenza B by PCR NEGATIVE NEGATIVE Final    Comment: (NOTE) The Xpert Xpress SARS-CoV-2/FLU/RSV plus assay is intended as an aid in the diagnosis of influenza from Nasopharyngeal  swab specimens and should not be used as a sole basis for treatment. Nasal washings and aspirates are unacceptable for Xpert Xpress SARS-CoV-2/FLU/RSV testing.  Fact Sheet for Patients: BloggerCourse.com  Fact Sheet for Healthcare Providers: SeriousBroker.it  This test is not yet approved or cleared by the [place] FDA and has been authorized for detection and/or diagnosis of SARS-CoV-2 by FDA under an Emergency Use Authorization (EUA). This EUA will remain in effect (meaning this test can be used) for the duration of the COVID-19 declaration under Section 564(b)(1) of the Act, 21 U.S.C. section 360bbb-3(b)(1), unless the authorization is terminated or revoked.     Resp Syncytial Virus by PCR NEGATIVE NEGATIVE Final    Comment: (NOTE) Fact Sheet for Patients: BloggerCourse.com  Fact Sheet for Healthcare Providers: SeriousBroker.it  This test is not yet approved or cleared by the [place] FDA and has been authorized for detection and/or diagnosis of SARS-CoV-2 by FDA under an Emergency Use Authorization (EUA). This EUA  will remain in effect (meaning this test can be used) for the duration of the COVID-19 declaration under Section 564(b)(1) of the Act, 21 U.S.C. section 360bbb-3(b)(1), unless the authorization is terminated or revoked.  Performed at [facility], [address] [place], [place] [address]          Radiology Studies: No results found.       Scheduled Meds:  calcium carbonate  2 tablet Oral Q3H   Chlorhexidine Gluconate Cloth  6 each Topical Daily   DULoxetine  60 mg Oral Daily   enoxaparin (LOVENOX) injection  40 mg Subcutaneous Q24H   gabapentin  900 mg Oral TID   heparin sodium (porcine)  1,000 Units Intracatheter Once   irbesartan  300 mg Oral Daily   levothyroxine  125 mcg Oral Q0600   metoprolol succinate  25 mg Oral Daily    potassium chloride  40 mEq Oral BID   predniSONE  60 mg Oral Q breakfast   sertraline  25 mg Oral Daily   sodium chloride flush  10-40 mL Intracatheter Q12H   sodium chloride flush  3 mL Intravenous Q12H   Continuous Infusions:  albumin human 25 % 50 g in sodium chloride 0.9 %     calcium gluconate     citrate dextrose     citrate dextrose     citrate dextrose       LOS: 6 days    Time spent: 40 minutes    Dorcas Carrow, MD [facility]

## 2023-11-04 NOTE — Plan of Care (Signed)

## 2023-11-04 NOTE — Progress Notes (Signed)
TRH night cross cover note:   I was notified by RN that the patient is complaining of heartburn and is specifically requesting Tums to address this.  I subsequently placed order for as needed Tums for heartburn.    Newton Pigg, DO Hospitalist

## 2023-11-04 NOTE — Progress Notes (Signed)
NIF -40 with great effort VC not available, IS 2500

## 2023-11-05 DIAGNOSIS — G6181 Chronic inflammatory demyelinating polyneuritis: Secondary | ICD-10-CM | POA: Diagnosis not present

## 2023-11-05 MED ORDER — ALBUTEROL SULFATE HFA 108 (90 BASE) MCG/ACT IN AERS
2.0000 | INHALATION_SPRAY | RESPIRATORY_TRACT | Status: DC | PRN
  Administered 2023-11-06: 2 via RESPIRATORY_TRACT
  Filled 2023-11-05: qty 6.7

## 2023-11-05 MED ORDER — OXYCODONE-ACETAMINOPHEN 5-325 MG PO TABS
1.0000 | ORAL_TABLET | ORAL | Status: DC | PRN
  Administered 2023-11-05 – 2023-11-10 (×25): 1 via ORAL
  Filled 2023-11-05 (×25): qty 1

## 2023-11-05 MED ORDER — GABAPENTIN 300 MG PO CAPS
900.0000 mg | ORAL_CAPSULE | Freq: Every day | ORAL | Status: DC | PRN
  Administered 2023-11-05: 900 mg via ORAL
  Filled 2023-11-05: qty 3

## 2023-11-05 NOTE — TOC Progression Note (Signed)
Transition of Care [facility]) - Progression Note    Patient Details  Name: Jonathan Luna MRN: [id number] Date of Birth: 1966/08/29  Transition of Care [facility]) CM/SW Contact  Kermit Balo, RN Phone Number: 11/05/2023, 12:50 PM  Clinical Narrative:     Therapy recs are now for CIR. Awaiting CIR eval. TOC following.  Expected Discharge Plan: OP Rehab Barriers to Discharge: Continued Medical Work up  Expected Discharge Plan and Services       Living arrangements for the past 2 months: Single Family Home                                       Social Determinants of Health (SDOH) Interventions SDOH Screenings   Food Insecurity: No Food Insecurity (10/29/2023)  Housing: Low Risk  (10/29/2023)  Transportation Needs: No Transportation Needs (10/29/2023)  Utilities: Not At Risk (10/29/2023)  Depression (PHQ2-9): Low Risk  (09/28/2023)  Financial Resource Strain: Patient Declined (05/27/2023)  Physical Activity: Insufficiently Active (05/27/2023)  Social Connections: Unknown (05/27/2023)  Stress: Stress Concern Present (05/27/2023)  Tobacco Use: Low Risk  (10/29/2023)    Readmission Risk Interventions     No data to display

## 2023-11-05 NOTE — Progress Notes (Addendum)
NEUROLOGY CONSULT FOLLOW UP NOTE   Date of service: November 05, 2023 Patient Name: Jonathan Luna MRN:  [id number] DOB:  20-Dec-1965  Interval Hx/subjective   Patient is scheduled to receive his last round of Plex tomorrow.  He verbalizes some improvement in strength but is still struggling and is hoping to go to inpatient rehab here.  This was recommended by OT.  Vitals   Vitals:   11/04/23 2329 11/05/23 0426 11/05/23 0449 11/05/23 0748  BP: 112/66 (!) 142/98  116/83  Pulse: 80 74  89  Resp: 18 18    Temp: 98.1 F (36.7 C) (!) 97.4 F (36.3 C)  98.2 F (36.8 C)  TempSrc: Oral Oral  Oral  SpO2: 97% 100%  96%  Weight:   83.9 kg   Height:         Body mass index is 28.98 kg/m.  Physical Exam   Constitutional: Appears well-developed and well-nourished.  Psych: Affect appropriate to situation.  Eyes: No scleral injection.  HENT: No OP obstrucion.  Head: Normocephalic.  Cardiovascular: Normal rate and regular rhythm.  Respiratory: Effort normal, non-labored breathing.  GI: Soft.  No distension. There is no tenderness.  Skin: WDI.   Neurologic Examination   Neuro: Mental Status: Patient is awake, alert, oriented to person, place, month, year, and situation. Patient is able to give a clear and coherent history. No signs of aphasia or neglect Cranial Nerves: II: Chronic superior visual field deficit, chronic right eye central vision loss, postsurgical irregular pupil on the right III,IV, VI: Dysconjugate gaze at times due to chronic eye conditions; EOM otherwise intact. End gaze nystagmus somewhat more prominent on left gaze V: Facial sensation is symmetric to temperature VII: Facial movement is symmetric.  VIII: hearing is intact to voice X: Phonation is normal XI: Shoulder shrug is symmetric. XII: tongue is midline without atrophy or fasciculations.  Motor: Overall, stronger proximally than distally Neck flexion 4+/5 neck extension 4+/5 (slightly limited by catheter  discomfort) BUE: 4+/5 shoulder, 4/5 bicep/tricep, 3/5 grips bilaterally, 2/5 finger spread  Finger flexion is 4/5, finger extension is 1-2/5  Wrist flexion is 4/5, wrist extension is 1-2/5  Overall slightly weaker in the left upper extremity than the right upper extremity (4 - on the left, 4+ on the right in the biceps and triceps for example) BLE: 4+/5 hip flexion, 4/5  knee flexion, 3/5 dorsal and plantar flexion Sensory: Reduced throughout lower left part of face, but improving. Sensation intact to light touch in BLE and BUE, improved per patient with isolated numbness in quadriceps area bilaterally Deep Tendon Reflexes: Absent  Cerebellar: FNF intact bilaterally  Medications  Current Facility-Administered Medications:    acetaminophen (TYLENOL) tablet 650 mg, 650 mg, Oral, Q6H PRN, 650 mg at 11/04/23 2056 **OR** acetaminophen (TYLENOL) suppository 650 mg, 650 mg, Rectal, Q6H PRN, Synetta Fail, MD   acetaminophen (TYLENOL) tablet 650 mg, 650 mg, Oral, Q4H PRN, Rejeana Brock, MD, 650 mg at 10/31/23 1048   acetaminophen (TYLENOL) tablet 650 mg, 650 mg, Oral, Q4H PRN, Rejeana Brock, MD, 650 mg at 11/04/23 1219   albuterol (VENTOLIN HFA) 108 (90 Base) MCG/ACT inhaler 2 puff, 2 puff, Inhalation, Q4H PRN, Dorcas Carrow, MD   aspirin EC tablet 81 mg, 81 mg, Oral, Daily, Ghimire, Lyndel Safe, MD, 81 mg at 11/04/23 2056   calcium carbonate (TUMS - dosed in mg elemental calcium) chewable tablet 200 mg of elemental calcium, 1 tablet, Oral, BID PRN, Howerter, Justin B, DO, 200 mg  of elemental calcium at 11/04/23 2202   Chlorhexidine Gluconate Cloth 2 % PADS 6 each, 6 each, Topical, Daily, Synetta Fail, MD, 6 each at 11/05/23 0450   citrate dextrose (ACD-A anticoagulant) solution 1,000 mL, 1,000 mL, Other, Continuous, Rejeana Brock, MD, 1,000 mL at 10/31/23 1143   citrate dextrose (ACD-A anticoagulant) solution 1,000 mL, 1,000 mL, Other, Continuous, Rejeana Brock, MD, 1,000 mL at 11/02/23 1234   citrate dextrose (ACD-A anticoagulant) solution 1,000 mL, 1,000 mL, Other, Continuous, Rejeana Brock, MD, 1,000 mL at 11/04/23 1314   clonazePAM (KLONOPIN) tablet 0.5 mg, 0.5 mg, Oral, BID PRN, Synetta Fail, MD, 0.5 mg at 11/05/23 0511   dextromethorphan-guaiFENesin (MUCINEX DM) 30-600 MG per 12 hr tablet 1 tablet, 1 tablet, Oral, BID, Dorcas Carrow, MD, 1 tablet at 11/04/23 2056   diphenhydrAMINE (BENADRYL) capsule 25 mg, 25 mg, Oral, Q6H PRN, Rejeana Brock, MD, 25 mg at 11/04/23 1219   DULoxetine (CYMBALTA) DR capsule 60 mg, 60 mg, Oral, Daily, Synetta Fail, MD, 60 mg at 11/04/23 0837   enoxaparin (LOVENOX) injection 40 mg, 40 mg, Subcutaneous, Q24H, Synetta Fail, MD, 40 mg at 11/04/23 1728   gabapentin (NEURONTIN) capsule 900 mg, 900 mg, Oral, TID, Synetta Fail, MD, 900 mg at 11/04/23 2056   gabapentin (NEURONTIN) capsule 900 mg, 900 mg, Oral, Daily PRN, Dorcas Carrow, MD   guaiFENesin-dextromethorphan (ROBITUSSIN DM) 100-10 MG/5ML syrup 5 mL, 5 mL, Oral, Q4H PRN, Synetta Fail, MD, 5 mL at 10/30/23 7425   irbesartan (AVAPRO) tablet 300 mg, 300 mg, Oral, Daily, Synetta Fail, MD, 300 mg at 11/04/23 9563   levothyroxine (SYNTHROID) tablet 125 mcg, 125 mcg, Oral, Q0600, Synetta Fail, MD, 125 mcg at 11/05/23 8756   metoprolol succinate (TOPROL-XL) 24 hr tablet 25 mg, 25 mg, Oral, Daily, Synetta Fail, MD, 25 mg at 11/04/23 4332   nicotine polacrilex (NICORETTE) gum 2 mg, 2 mg, Oral, PRN, John Giovanni, MD, 2 mg at 10/30/23 0544   Oral care mouth rinse, 15 mL, Mouth Rinse, PRN, Synetta Fail, MD   oxyCODONE-acetaminophen (PERCOCET/ROXICET) 5-325 MG per tablet 1 tablet, 1 tablet, Oral, Q4H PRN, Dorcas Carrow, MD   polyethylene glycol (MIRALAX / GLYCOLAX) packet 17 g, 17 g, Oral, Daily PRN, Synetta Fail, MD   potassium chloride SA (KLOR-CON M) CR tablet 40 mEq, 40 mEq,  Oral, BID, Ghimire, Kuber, MD, 40 mEq at 11/04/23 2056   predniSONE (DELTASONE) tablet 60 mg, 60 mg, Oral, Q breakfast, Hetty Blend C, NP, 60 mg at 11/05/23 9518   sertraline (ZOLOFT) tablet 25 mg, 25 mg, Oral, Daily, Synetta Fail, MD, 25 mg at 11/04/23 0836   sodium chloride flush (NS) 0.9 % injection 10-40 mL, 10-40 mL, Intracatheter, Q12H, Synetta Fail, MD, 10 mL at 11/04/23 2100   sodium chloride flush (NS) 0.9 % injection 10-40 mL, 10-40 mL, Intracatheter, PRN, Synetta Fail, MD   sodium chloride flush (NS) 0.9 % injection 3 mL, 3 mL, Intravenous, Q12H, Synetta Fail, MD, 3 mL at 11/04/23 2100  Labs and Diagnostic Imaging   CBC:  Recent Labs  Lab 11/02/23 0551 11/04/23 0850  WBC 11.0* 13.2*  NEUTROABS 4.9 6.8  HGB 13.3 13.2  HCT 38.4* 37.2*  MCV 88.5 89.0  PLT 246 275    Basic Metabolic Panel:  Lab Results  Component Value Date   NA 138 11/04/2023   K 3.1 (L) 11/04/2023   CO2 27 11/04/2023  GLUCOSE 115 (H) 11/04/2023   BUN 11 11/04/2023   CREATININE 0.88 11/04/2023   CALCIUM 8.5 (L) 11/04/2023   GFRNONAA >60 11/04/2023   GFRAA 94 08/16/2008   Lipid Panel:  Lab Results  Component Value Date   LDLCALC 106 (H) 10/08/2023   HgbA1c:  Lab Results  Component Value Date   HGBA1C 5.3 09/22/2023   Urine Drug Screen:     Component Value Date/Time   LABOPIA NEG 09/29/2012 0830   COCAINSCRNUR Negative 08/18/2023 1114   COCAINSCRNUR NEG 09/29/2012 0830   LABBENZ POS (A) 09/29/2012 0830   AMPHETMU NEG 09/29/2012 0830    Alcohol Level No results found for: "ETH" INR  Lab Results  Component Value Date   INR 1.0 08/29/2023   APTT  Lab Results  Component Value Date   APTT 32 08/29/2023   AED levels: No results found for: "PHENYTOIN", "ZONISAMIDE", "LAMOTRIGINE", "LEVETIRACETA"  10/25/23 CT Head without contrast(Personally reviewed): - No acute findings  12/19 MRI Brain(Personally reviewed): - Normal brain MRI   NIF/VC  11/20- NIF  -40 VC atleast 2.5L.  Pt performed with great effort.   NIF/VC 11/17 - NIF -40  NIF/VC 1/23 NIF: -40 cm of water  VC: Not available   Assessment   Jonathan Luna is a 58 y.o. male who has been recently diagnosed with CIDP and was recently discharged from rehab on 10/20/2023. He completed 5 days of IVIG mid-December 2024. Discharged to CIR, discharged 10/20/23. Followed up with outpatient neurologist, Dr. Terrace Arabia, who sent patient to ED due to progressive weakness and worsening symptoms. He is having trouble ambulating and doing simple tasks. He was sent from neurology office for admission and initiation of Plex treatments.  Overall weakness, stronger distally then proximal. He only had a short duration of smyptom improvement after last month's IVIG treatments.   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   Recommendations   - PLEX 1/17, 1/18, 1/20, 1/22, 1/24 (last treatment due tomorrow) - Continue 60mg  Prednisone maintenance steroids - Additionally recommend patient continue vitamin D/calcium supplements while on steroids - Close outpatient follow-up with Dr. Terrace Arabia, if continued on prednisone greater than 20 mg daily dose would consider PJP prophylaxis  - Continue home gabapentin for neuropathic pain - Continue VC and NiF Q12H - Continue PT/OT - Encourage OOB for meals, if able.  - Anticipating patient's exam will remain stable to slightly improved tomorrow, neurology will sign off at this time.  Please reach out if additional neurologic questions or concerns arise ______________________________________________________________________   > 50 min spent in care of patient, majority at bedside answering questions about his condition and formulating plan  Brooke Dare MD-PhD [facility] [phone] Available 7 AM to 7 PM, outside these hours please contact Neurologist on call listed on AMION

## 2023-11-05 NOTE — Plan of Care (Signed)
Alert and oriented x4. Ambulatory to bathroom with 1 staff assist and rolling walker.  During ambulation to bathroom this afternoon, patient with loss of balance and gait instability. Educated patient on importance of safety, and for his safety, he will use urinal at bedside.  Medicated with PRN pain medications, see MAR.  No family at bedside noted today.    Problem: Education: Goal: Knowledge of General Education information will improve Description: Including pain rating scale, medication(s)/side effects and non-pharmacologic comfort measures Outcome: Progressing   Problem: Health Behavior/Discharge Planning: Goal: Ability to manage health-related needs will improve Outcome: Progressing   Problem: Clinical Measurements: Goal: Ability to maintain clinical measurements within normal limits will improve Outcome: Progressing Goal: Will remain free from infection Outcome: Progressing Goal: Diagnostic test results will improve Outcome: Progressing Goal: Respiratory complications will improve Outcome: Progressing Goal: Cardiovascular complication will be avoided Outcome: Progressing

## 2023-11-05 NOTE — Progress Notes (Signed)
PROGRESS NOTE    Jonathan Luna  ZOX:[id number] DOB: 12-15-1965 DOA: 10/29/2023 PCP: [name], MD    Brief Narrative:  58 year old with history of hypertension, hyperlipidemia, hypothyroidism and anxiety , neuropathy and CIDP presented with worsening weakness and gait instability from neurology clinic.  Patient was in the hospital last month with similar symptoms and treated with IVIG.  Reported worsening symptoms since last 2 weeks.  Also with upper extremity numbness and weakness.  In the emergency room hemodynamically stable.  Admitted for plasma exchange.  Subjective:  Patient seen and examined.  Has severe neuropathic pain in his legs.  Wants to use more gabapentin. No other overnight events. He is still anticipating that he can go to CIR.   Assessment & Plan:   CIDP: Recurrent weakness and gait abnormality.  Admitted for plasma exchange.  Continue respiratory therapy evaluation, NIF and vital capacity.  All supportive care available.  Stable so far. IR placed temporary catheter right internal jugular.  Plasma exchange 1/17, 1/18, 1/20. Next plasmapheresis will be 1/24.  on high-dose steroids.  Neurology following.  Continue to work with PT OT. Will need ongoing therapies.  Inpatient versus outpatient.  Hyponatremia: Due to dehydration.  Improved with isotonic fluid.  Hypokalemia: Replace further today.  Recheck tomorrow morning.  Hypocalcemia: Adequate.  On Tums.  Hypertension: Blood pressure is stable on irbesartan and metoprolol.  Hypothyroidism: On thyroid supplementation.  Anxiety disorder: On duloxetine, sertraline and as needed Klonopin.  Neuropathy: On gabapentin Trileptal continued.  Chronic pain syndrome: As above on Cymbalta gabapentin and as needed oxycodone.  Added gabapentin 900 mg in addition to his 3 times daily doses as as needed.  Nicotine use: Patient with significant nicotine use.  wants to use his nicotine pouches, will allow to use it.   DVT  prophylaxis: enoxaparin (LOVENOX) injection 40 mg Start: 10/29/23 1800   Code Status: Full code Family Communication: None today. Disposition Plan: Status is: Inpatient Remains inpatient appropriate because: Active inpatient treatment     Consultants:  Neurology  Procedures:  Plasma exchange  Antimicrobials:  None     Objective: Vitals:   11/05/23 0426 11/05/23 0449 11/05/23 0748 11/05/23 0749  BP: (!) 142/98  116/83 116/83  Pulse: 74  89 89  Resp: 18     Temp: (!) 97.4 F (36.3 C)  98.2 F (36.8 C)   TempSrc: Oral  Oral   SpO2: 100%  96%   Weight:  83.9 kg    Height:        Intake/Output Summary (Last 24 hours) at 11/05/2023 1123 Last data filed at 11/05/2023 0842 Gross per 24 hour  Intake 490 ml  Output 1800 ml  Net -1310 ml    Filed Weights   11/04/23 1315 11/04/23 1446 11/05/23 0449  Weight: 83.4 kg (S) 83.4 kg 83.9 kg    Examination:  General exam: Calm and comfortable.  Pleasant and interactive. Respiratory system: No added sounds.  Bilateral clear. Cardiovascular system: S1 & S2 heard, RRR.  No pedal edema. Gastrointestinal system: Soft nontender. Central nervous system: Alert and oriented.  Pleasant interaction. Both upper extremity with improved weakness.  Proximal muscle groups are almost 4/5.  Distal muscle group including wrist extension 2/5.  Left weaker than right. lower extremities weakness, 4/5.   Data Reviewed: I have personally reviewed following labs and imaging studies  CBC: Recent Labs  Lab 10/29/23 1526 10/30/23 0555 10/31/23 0626 11/02/23 0551 11/04/23 0850  WBC 12.8* 9.2 12.5* 11.0* 13.2*  NEUTROABS  7.4  --  7.4 4.9 6.8  HGB 16.1 14.9 15.2 13.3 13.2  HCT 46.0 42.6 43.7 38.4* 37.2*  MCV 88.5 87.5 88.1 88.5 89.0  PLT 425* 333 326 246 275   Basic Metabolic Panel: Recent Labs  Lab 10/29/23 2229 10/30/23 0555 10/31/23 0626 11/02/23 0551 11/04/23 0850  NA 128* 130* 135 137 138  K 3.1* 3.4* 3.9 3.5 3.1*  CL 93* 94*  104 104 102  CO2 24 25 24 26 27   GLUCOSE 122* 99 103* 85 115*  BUN 21* 16 8 9 11   CREATININE 0.82 0.68 0.66 0.77 0.88  CALCIUM 9.1 9.0 9.0 8.6* 8.5*  MG  --   --  1.9 2.1  --   PHOS  --   --  2.9 3.6  --    GFR: Estimated Creatinine Clearance: 95.9 mL/min (by C-G formula based on SCr of 0.88 mg/dL). Liver Function Tests: Recent Labs  Lab 10/30/23 0555 10/31/23 0626 11/02/23 0551  AST 18 16 16   ALT 22 15 14   ALKPHOS 57 27* 22*  BILITOT 0.8 0.8 0.6  PROT 7.2 6.0* 5.2*  ALBUMIN 3.7 4.2 3.6   No results for input(s): "LIPASE", "AMYLASE" in the last 168 hours. No results for input(s): "AMMONIA" in the last 168 hours. Coagulation Profile: No results for input(s): "INR", "PROTIME" in the last 168 hours. Cardiac Enzymes: No results for input(s): "CKTOTAL", "CKMB", "CKMBINDEX", "TROPONINI" in the last 168 hours. BNP (last 3 results) Recent Labs    09/04/23 1021  PROBNP 6.0   HbA1C: No results for input(s): "HGBA1C" in the last 72 hours. CBG: No results for input(s): "GLUCAP" in the last 168 hours. Lipid Profile: No results for input(s): "CHOL", "HDL", "LDLCALC", "TRIG", "CHOLHDL", "LDLDIRECT" in the last 72 hours. Thyroid Function Tests: No results for input(s): "TSH", "T4TOTAL", "FREET4", "T3FREE", "THYROIDAB" in the last 72 hours. Anemia Panel: No results for input(s): "VITAMINB12", "FOLATE", "FERRITIN", "TIBC", "IRON", "RETICCTPCT" in the last 72 hours. Sepsis Labs: No results for input(s): "PROCALCITON", "LATICACIDVEN" in the last 168 hours.  No results found for this or any previous visit (from the past 240 hours).        Radiology Studies: No results found.       Scheduled Meds:  aspirin EC  81 mg Oral Daily   Chlorhexidine Gluconate Cloth  6 each Topical Daily   dextromethorphan-guaiFENesin  1 tablet Oral BID   DULoxetine  60 mg Oral Daily   enoxaparin (LOVENOX) injection  40 mg Subcutaneous Q24H   gabapentin  900 mg Oral TID   irbesartan  300 mg  Oral Daily   levothyroxine  125 mcg Oral Q0600   metoprolol succinate  25 mg Oral Daily   potassium chloride  40 mEq Oral BID   predniSONE  60 mg Oral Q breakfast   sertraline  25 mg Oral Daily   sodium chloride flush  10-40 mL Intracatheter Q12H   sodium chloride flush  3 mL Intravenous Q12H   Continuous Infusions:  citrate dextrose     citrate dextrose     citrate dextrose       LOS: 7 days    Time spent: 40 minutes    Dorcas Carrow, MD [facility]

## 2023-11-05 NOTE — Plan of Care (Signed)

## 2023-11-05 NOTE — Progress Notes (Signed)
Physical Therapy Treatment Patient Details Name: Jonathan Luna MRN: [id number] DOB: 1965-12-09 Today's Date: 11/05/2023   History of Present Illness Pt is a 58 y/o male admitted 10/29/23  with worsening weakness and gait instability from neurology clinic. 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 To have plasma exchange therapy. Dialysis catheter RIJ placed.  PMHx  HTN, HLD, chronic pain, neuropathy, gait abnormality, CIDP    PT Comments  Pt resting in bed on arrival and agreeable to session with steady progress towards acute goals. Pt requiring max A for set up and to don bil shoes with AFOs as pt with decreased fine motor coordination in UE and impaired LE proprioception. Physically pt requiring grossly CGA for transfers and gait with RW for support with improved LE clearance with AFOs donned. Pt with UE tremors last ~40' of gait, suspect due to UE fatigue, as well as noted bil knee hyperextension in stance with fatigue. Pt continues to be limited by weakness, impaired coordination/proprioception, and decreased activity tolerance. Discussed with supervising PT and updated recommendation as pt will benefit from intensive inpatient follow up therapy, >3 hours/day to maximize safety and independence with functional mobility. Pt continues to benefit from skilled PT services to progress toward functional mobility goals.      If plan is discharge home, recommend the following: A little help with walking and/or transfers;Assistance with cooking/housework;Assist for transportation;Help with stairs or ramp for entrance   Can travel by private vehicle        Equipment Recommendations  None recommended by PT    Recommendations for Other Services Rehab consult     Precautions / Restrictions Precautions Precautions: Fall Restrictions Weight Bearing Restrictions Per Provider Order: No     Mobility  Bed Mobility Overal bed mobility: Modified Independent              General bed mobility comments: HOB elevated; incr time/effort supine to sit to supine, on R and L side of bed    Transfers Overall transfer level: Needs assistance Equipment used: Rolling walker (2 wheels) Transfers: Sit to/from Stand Sit to Stand: Contact guard assist           General transfer comment: CGA for safety, from EOB    Ambulation/Gait Ambulation/Gait assistance: Contact guard assist Gait Distance (Feet): 168 Feet Assistive device: Rolling walker (2 wheels) Gait Pattern/deviations: Step-through pattern, Decreased stride length, Decreased dorsiflexion - right, Decreased dorsiflexion - left, Knee hyperextension - right, Knee hyperextension - left, Steppage Gait velocity: decr     General Gait Details: AFOs donned with improved LE clearance and improved general stability; vc for proximity to RW, noted tremoring in UEs with fatigue last ~40', decreased bil knee control with hyperextension noted   Stairs             Wheelchair Mobility     Tilt Bed    Modified Rankin (Stroke Patients Only)       Balance Overall balance assessment: Needs assistance Sitting-balance support: No upper extremity supported, Feet unsupported Sitting balance-Leahy Scale: Good Sitting balance - Comments: sits unsupported EOB   Standing balance support: During functional activity, Reliant on assistive device for balance Standing balance-Leahy Scale: Poor Standing balance comment: reliant on UE support during dynamic tasks                            Cognition Arousal: Alert Behavior During Therapy: WFL for tasks assessed/performed Overall Cognitive Status:  Within Functional Limits for tasks assessed                                 General Comments: decr awareness that his overexertion can cause him to deteriorate        Exercises Other Exercises Other Exercises: staggered stand sit<>stand x5 ea side, (pt needing up to mod A to stand  fully with feet staggered)    General Comments        Pertinent Vitals/Pain Pain Assessment Pain Assessment: No/denies pain    Home Living                          Prior Function            PT Goals (current goals can now be found in the care plan section) Acute Rehab PT Goals Patient Stated Goal: regain the strength he has lost PT Goal Formulation: With patient Time For Goal Achievement: 11/15/23 Progress towards PT goals: Progressing toward goals    Frequency    Min 1X/week      PT Plan      Co-evaluation              AM-PAC PT "6 Clicks" Mobility   Outcome Measure  Help needed turning from your back to your side while in a flat bed without using bedrails?: None Help needed moving from lying on your back to sitting on the side of a flat bed without using bedrails?: None Help needed moving to and from a bed to a chair (including a wheelchair)?: A Little Help needed standing up from a chair using your arms (e.g., wheelchair or bedside chair)?: A Little Help needed to walk in hospital room?: A Little Help needed climbing 3-5 steps with a railing? : Total 6 Click Score: 18    End of Session Equipment Utilized During Treatment: Gait belt Activity Tolerance: Patient limited by fatigue;Patient tolerated treatment well Patient left: with call bell/phone within reach;in bed;Other (comment) (seated EOB with lunch tray set up) Nurse Communication: Mobility status PT Visit Diagnosis: Other abnormalities of gait and mobility (R26.89);Muscle weakness (generalized) (M62.81);History of falling (Z91.81)     Time: 6578-4696 PT Time Calculation (min) (ACUTE ONLY): 21 min  Charges:    $Gait Training: 8-22 mins PT General Charges $$ ACUTE PT VISIT: 1 Visit                     Prudencio Velazco R. PTA Acute Rehabilitation Services Office: [phone]   Catalina Antigua 11/05/2023, 12:36 PM

## 2023-11-05 NOTE — Progress Notes (Signed)
Inpatient Rehab Admissions Coordinator Note:   Per updated PT recommendations patient was screened for CIR candidacy by Stephania Fragmin, PT. At this time, pt appears to be a potential candidate for CIR. I will place an order for rehab consult for full assessment, per our protocol.  Please contact me any with questions.Estill Dooms, PT, DPT [phone] 11/05/23 4:21 PM

## 2023-11-05 NOTE — Progress Notes (Signed)
Pt performed w/ great effort NIF: -40 cmH2O VC: not available; IS 2500

## 2023-11-06 ENCOUNTER — Telehealth (HOSPITAL_COMMUNITY): Payer: Self-pay | Admitting: Emergency Medicine

## 2023-11-06 ENCOUNTER — Ambulatory Visit: Payer: 59

## 2023-11-06 ENCOUNTER — Ambulatory Visit: Payer: 59 | Admitting: Occupational Therapy

## 2023-11-06 DIAGNOSIS — G6181 Chronic inflammatory demyelinating polyneuritis: Secondary | ICD-10-CM | POA: Diagnosis not present

## 2023-11-06 LAB — CBC WITH DIFFERENTIAL/PLATELET
Abs Immature Granulocytes: 0 10*3/uL (ref 0.00–0.07)
Basophils Absolute: 0 10*3/uL (ref 0.0–0.1)
Basophils Relative: 0 %
Eosinophils Absolute: 0.7 10*3/uL — ABNORMAL HIGH (ref 0.0–0.5)
Eosinophils Relative: 4 %
HCT: 40.8 % (ref 39.0–52.0)
Hemoglobin: 13.8 g/dL (ref 13.0–17.0)
Lymphocytes Relative: 44 %
Lymphs Abs: 7.4 10*3/uL — ABNORMAL HIGH (ref 0.7–4.0)
MCH: 30.7 pg (ref 26.0–34.0)
MCHC: 33.8 g/dL (ref 30.0–36.0)
MCV: 90.7 fL (ref 80.0–100.0)
Monocytes Absolute: 0.8 10*3/uL (ref 0.1–1.0)
Monocytes Relative: 5 %
Neutro Abs: 7.9 10*3/uL — ABNORMAL HIGH (ref 1.7–7.7)
Neutrophils Relative %: 47 %
Platelets: 308 10*3/uL (ref 150–400)
RBC: 4.5 MIL/uL (ref 4.22–5.81)
RDW: 12.6 % (ref 11.5–15.5)
WBC: 16.8 10*3/uL — ABNORMAL HIGH (ref 4.0–10.5)
nRBC: 0 % (ref 0.0–0.2)
nRBC: 0 /100{WBCs}

## 2023-11-06 LAB — COMPREHENSIVE METABOLIC PANEL
ALT: 34 U/L (ref 0–44)
AST: 20 U/L (ref 15–41)
Albumin: 4.2 g/dL (ref 3.5–5.0)
Alkaline Phosphatase: 21 U/L — ABNORMAL LOW (ref 38–126)
Anion gap: 9 (ref 5–15)
BUN: 9 mg/dL (ref 6–20)
CO2: 28 mmol/L (ref 22–32)
Calcium: 9.2 mg/dL (ref 8.9–10.3)
Chloride: 101 mmol/L (ref 98–111)
Creatinine, Ser: 0.69 mg/dL (ref 0.61–1.24)
GFR, Estimated: >60 mL/min (ref >60)
Glucose, Bld: 80 mg/dL (ref 70–99)
Potassium: 4.2 mmol/L (ref 3.5–5.1)
Sodium: 138 mmol/L (ref 135–145)
Total Bilirubin: 0.7 mg/dL (ref 0.0–1.2)
Total Protein: 5.6 g/dL — ABNORMAL LOW (ref 6.5–8.1)

## 2023-11-06 LAB — MAGNESIUM: Magnesium: 2.3 mg/dL (ref 1.7–2.4)

## 2023-11-06 MED ORDER — CALCIUM GLUCONATE-NACL 2-0.675 GM/100ML-% IV SOLN
2.0000 g | Freq: Once | INTRAVENOUS | Status: AC
  Administered 2023-11-06: 2000 mg via INTRAVENOUS
  Filled 2023-11-06 (×2): qty 100

## 2023-11-06 MED ORDER — SODIUM CHLORIDE 0.9 % IV SOLN
INTRAVENOUS | Status: AC
  Filled 2023-11-06 (×3): qty 200

## 2023-11-06 MED ORDER — CALCIUM CARBONATE ANTACID 500 MG PO CHEW
2.0000 | CHEWABLE_TABLET | ORAL | Status: AC
  Administered 2023-11-06 (×2): 400 mg via ORAL
  Filled 2023-11-06 (×2): qty 2

## 2023-11-06 MED ORDER — HEPARIN SODIUM (PORCINE) 1000 UNIT/ML IJ SOLN
INTRAMUSCULAR | Status: AC
  Administered 2023-11-06: 1000 [IU]
  Filled 2023-11-06: qty 3

## 2023-11-06 MED ORDER — SODIUM CHLORIDE 0.9 % IV SOLN: Freq: Once | INTRAVENOUS | Status: DC

## 2023-11-06 MED ORDER — HEPARIN SODIUM (PORCINE) 1000 UNIT/ML IJ SOLN
1000.0000 [IU] | Freq: Once | INTRAMUSCULAR | Status: DC
Start: 2023-11-06 — End: 2023-11-10

## 2023-11-06 MED ORDER — DIPHENHYDRAMINE HCL 25 MG PO CAPS
25.0000 mg | ORAL_CAPSULE | Freq: Four times a day (QID) | ORAL | Status: DC | PRN
Start: 2023-11-06 — End: 2023-11-10
  Administered 2023-11-06 – 2023-11-09 (×5): 25 mg via ORAL
  Filled 2023-11-06 (×5): qty 1

## 2023-11-06 MED ORDER — ACETAMINOPHEN 325 MG PO TABS
650.0000 mg | ORAL_TABLET | ORAL | Status: DC | PRN
  Administered 2023-11-06: 650 mg via ORAL

## 2023-11-06 MED ORDER — ACD FORMULA A 0.73-2.45-2.2 GM/100ML VI SOLN
1000.0000 mL | Status: DC
  Administered 2023-11-06: 1000 mL
  Filled 2023-11-06 (×2): qty 1000

## 2023-11-06 NOTE — Consult Note (Signed)
Physical Medicine and Rehabilitation Consult Reason for Consult: Evaluate appropriateness for Inpatient Rehab Referring Physician: Dr. Meriam Sprague    HPI: Jonathan Luna is a 58 y.o. male with PMHx of  has a past medical history of Allergy, Anxiety, Arthritis, Cataract, Community acquired pneumonia (08/28/2023), Depression, Detached retina, Heart murmur, History of bilateral cataract extraction, Hyperlipidemia, Hypothyroidism, Low back pain, Panic attack, Panic attacks, and Pneumonia of right upper lobe due to infectious organism (08/18/2023). . They were admitted to [facility] on 10/29/2023 for worsening weakness and paresthesias, evaluated in neurology clinic by Dr. 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  This patient is well-known to the PM&R service due to recent inpatient rehab stay 12-23 to 10-20-2023 for the same.  He has undergone plasmapheresis 1-17, 18, and 20, with next planned today.  He remains on high-dose steroids, with NIFs -40 vital capacity appropriate.  This hospitalization has been complicated by hyponatremia, hypokalemia, hypertension, hypothyroidism, anxiety, and chronic pain with worsening neuropathy.  PM&R was consulted to evaluate appropriateness for IPR admission.   Per current therapy notes, he is max assist with lateral leaning for lower body bathing/dressing, otherwise contact-guard assist for transfers and gait with rolling walker up to 168 feet.  During patient's last inpatient rehab stay, he was modified independent of ADLs and able to ambulate 409 feet at a modified independent level with use of a rollator.  Plan is still to return home with single level access and wife who is available to provide support.  Review of Systems  Constitutional:  Positive for malaise/fatigue. Negative for chills and fever.  HENT:  Negative for hearing loss and sore throat.   Eyes:  Negative for blurred vision and double vision.  Respiratory:   Negative for cough and shortness of breath.   Cardiovascular:  Positive for orthopnea and leg swelling. Negative for chest pain and palpitations.  Gastrointestinal:  Negative for abdominal pain, constipation, diarrhea, nausea and vomiting.  Genitourinary:  Negative for dysuria and urgency.  Musculoskeletal:  Negative for joint pain and neck pain.  Skin:  Positive for itching. Negative for rash.  Neurological:  Positive for tingling, tremors, sensory change and focal weakness. Negative for dizziness and headaches.  Psychiatric/Behavioral:  The patient is nervous/anxious and has insomnia.    Past Medical History:  Diagnosis Date   Allergy    Anxiety    Arthritis    Cataract    Community acquired pneumonia 08/28/2023   Depression    Detached retina    Heart murmur    History of bilateral cataract extraction    Hyperlipidemia    Hypothyroidism    Low back pain    Panic attack    Panic attacks    Pneumonia of right upper lobe due to infectious organism 08/18/2023   Past Surgical History:  Procedure Laterality Date   CATARACT EXTRACTION     EYE SURGERY     IR FLUORO GUIDE CV LINE RIGHT  10/29/2023   IR US GUIDE VASC ACCESS RIGHT  10/29/2023   Family History  Problem Relation Age of Onset   Breast cancer Mother    Hypertension Father    Heart disease Father    Asthma Father    Emphysema Father        smoker for 45 years   Diabetes Brother    Hyperlipidemia Brother    Social History:  reports that he has never smoked. He has been exposed  to tobacco smoke. He has never used smokeless tobacco. He reports that he does not currently use alcohol. He reports that he does not use drugs. Allergies:  Allergies  Allergen Reactions   Amoxil [Amoxicillin] Other (See Comments)    Pt states that it does not work for him when he had back to back pneumonia   Effexor [Venlafaxine] Diarrhea   Prozac [Fluoxetine Hcl] Diarrhea   Medications Prior to Admission  Medication Sig Dispense Refill    albuterol (VENTOLIN HFA) 108 (90 Base) MCG/ACT inhaler Inhale 1-2 puffs into the lungs every 6 (six) hours as needed for wheezing or shortness of breath. 1 each 0   aspirin EC 81 MG tablet Take 81 mg by mouth daily. Swallow whole.     Cholecalciferol (VITAMIN D-3) 25 MCG (1000 UT) CAPS Take 1 capsule by mouth daily.     clonazePAM (KLONOPIN) 0.5 MG tablet Take 1 tablet (0.5 mg total) by mouth 2 (two) times daily as needed for anxiety. 15 tablet 0   DULoxetine (CYMBALTA) 60 MG capsule Take 1 capsule (60 mg total) by mouth daily. 30 capsule 11   fluticasone (FLONASE) 50 MCG/ACT nasal spray Place 2 sprays into both nostrils daily. 48 g 1   gabapentin (NEURONTIN) 300 MG capsule Take 3 capsules (900 mg total) by mouth 3 (three) times daily. 270 capsule 11   irbesartan (AVAPRO) 300 MG tablet Take 1 tablet (300 mg total) by mouth daily. 30 tablet 0   metoprolol succinate (TOPROL XL) 25 MG 24 hr tablet Take 1 tablet (25 mg total) by mouth daily. 30 tablet 0   oxyCODONE-acetaminophen (PERCOCET/ROXICET) 5-325 MG tablet Take 1 tablet by mouth every 8 (eight) hours as needed for severe pain (pain score 7-10). 90 tablet 0   sertraline (ZOLOFT) 25 MG tablet Take 1 tablet (25 mg total) by mouth daily. 30 tablet 0   tiZANidine (ZANAFLEX) 4 MG tablet Take 1 tablet (4 mg total) by mouth 2 (two) times daily. 60 tablet 0   UNITHROID 125 MCG tablet Take 1 tablet (125 mcg total) by mouth daily before breakfast. (Patient taking differently: Take 125 mcg by mouth at bedtime.) 90 tablet 0   Oxcarbazepine (TRILEPTAL) 300 MG tablet Take 1 tablet (300 mg total) by mouth 2 (two) times daily. (Patient not taking: Reported on 10/29/2023) 60 tablet 11   predniSONE (DELTASONE) 10 MG tablet Take 6 tablets (60 mg total) by mouth daily with breakfast. (Patient not taking: Reported on 10/29/2023) 180 tablet 6    Home: Home Living Family/patient expects to be discharged to:: Private residence Living Arrangements: Spouse/significant  other Available Help at Discharge: Family, Available PRN/intermittently Type of Home: House Home Access: Stairs to enter Secretary/administrator of Steps: 1 Entrance Stairs-Rails: None Home Layout: One level Bathroom Shower/Tub: Engineer, manufacturing systems: Standard Bathroom Accessibility: No Home Equipment: Tub bench, Rollator (4 wheels), Grab bars - tub/shower  Functional History: Prior Function Prior Level of Function : Independent/Modified Independent, History of Falls (last six months) Mobility Comments: uses rollator ADLs Comments: was ind up until this admission, spouse has assist for ADLs Functional Status:  Mobility: Bed Mobility Overal bed mobility: Modified Independent General bed mobility comments: HOB elevated; incr time/effort supine to sit to supine, on R and L side of bed Transfers Overall transfer level: Needs assistance Equipment used: Rolling walker (2 wheels) Transfers: Sit to/from Stand Sit to Stand: Contact guard assist General transfer comment: CGA for safety, from EOB Ambulation/Gait Ambulation/Gait assistance: Contact guard assist Gait Distance (Feet):  168 Feet Assistive device: Rolling walker (2 wheels) Gait Pattern/deviations: Step-through pattern, Decreased stride length, Decreased dorsiflexion - right, Decreased dorsiflexion - left, Knee hyperextension - right, Knee hyperextension - left, Steppage General Gait Details: AFOs donned with improved LE clearance and improved general stability; vc for proximity to RW, noted tremoring in UEs with fatigue last ~40', decreased bil knee control with hyperextension noted Gait velocity: decr Gait velocity interpretation: 1.31 - 2.62 ft/sec, indicative of limited community ambulator    ADL: ADL Overall ADL's : Needs assistance/impaired Eating/Feeding: Set up, With adaptive utensils, With assist to don/doff brace/orthosis Eating/Feeding Details (indicate cue type and reason): weighted fork Grooming: Minimal  assistance, Oral care, Wash/dry face Grooming Details (indicate cue type and reason): using wrist support cuff to brush teeth with RUE Upper Body Bathing: Moderate assistance, Sitting Lower Body Bathing: Sitting/lateral leans, Maximal assistance Lower Body Bathing Details (indicate cue type and reason): supported afo/shoe and had pt. practice management of velcro required max a, attempted shoe laces pt. able to do the initial crossing of laces but unable to hold and make the bow portions.  reviewed importance of cont. work with "pinch" exercises with theraputty and foam as precurser to gain strength for this portion of managing shoes/afos Upper Body Dressing : Moderate assistance, Sitting Lower Body Dressing: Moderate assistance, Sitting/lateral leans Toilet Transfer: Minimal assistance Toileting- Clothing Manipulation and Hygiene: Minimal assistance Functional mobility during ADLs: Minimal assistance, Rolling walker (2 wheels) General ADL Comments: session focused on UE exercises and seated ADL  Cognition: Cognition Overall Cognitive Status: Within Functional Limits for tasks assessed Orientation Level: Oriented X4 Cognition Arousal: Alert Behavior During Therapy: WFL for tasks assessed/performed Overall Cognitive Status: Within Functional Limits for tasks assessed General Comments: vebalizing more awareness of understanding about overexertion from previous session/education from other therapists  Blood pressure 121/78, pulse 85, temperature 97.9 F (36.6 C), temperature source Oral, resp. rate 20, height 5\' 7"  (1.702 m), weight 83.9 kg, SpO2 94%. Physical Exam Constitutional: No apparent distress. Appropriate appearance for age. Laying in bed.  HENT: No JVD. Neck Supple. Trachea midline.  Mild left facial droop.  Eyes: Left lens subluxation.  Right exotropia. EOMI grossly intact.  Center visual field loss in right eye. + Glasses Cardiovascular: RRR, no murmurs/rub/gallops. No Edema.  Peripheral pulses 2+  Respiratory: CTAB. No rales, rhonchi, or wheezing. On RA.  Can carry on normal conversation without shortness of breath. Abdomen: + bowel sounds, normoactive. No distention or tenderness.  Skin: C/D/I. No apparent lesions.  Peripheral IVs intact. MSK:      No apparent deformity.        Tight plantarflexors and wrist flexors bilaterally, can range to neutral        Neurologic exam:  Cognition: AAO to person, place, time and event.  Language: Fluent, No substitutions or neoglisms. No dysarthria. Names 3/3 objects correctly.  Memory: Recalls 3/3 objects at 5 minutes. No apparent deficits  Insight: Good  insight into current condition.  Mood: Pleasant affect, appropriate mood. Anxious. Sensation: To light touch altered in stocking-glove pattern in bilateral lower and upper extremities; especially in the bilateral 4-5th digits palmar area. + Bilateral LE proprioceptive difficulty with 1st toes flexion/extension 3/5 correct    Reflexes: 1+ in BL UE and LEs. Negative Hoffman's and babinski signs bilaterally.  CN: + L facial droop, L V2-3 sensory deficit Coordination: Large amplitude tremors with activation of bilateral deltoids, triceps and biceps. + Difficulty with UE fine motor. + Altered FTN, HTS bilaterally.   Spasticity:  MAS 0 in all extremities.; resting plantarflexion and wrist and finger flexor tone       Strength:                RUE: 4/5 SA, 4-/5 EF, 4-/5 EE, 3-/5 WE, 2/5 FF, 3/5 FA                LUE:  4/5 SA, 4-/5 EF, 4-/5 EE, 3-/5 WE, 2/5 FF, 2/5 FA                RLE: 4/5 HF, 5-/5 KE, 1/5  DF, 1/5  EHL, 4-/5  PF                 LLE:  4/5 HF, 5-/5 KE, 1/5  DF, 1/5  EHL, 4-/5  PF     Results for orders placed or performed during the hospital encounter of 10/29/23 (from the past 24 hours)  CBC with Differential/Platelet     Status: Abnormal   Collection Time: 11/06/23  5:10 AM  Result Value Ref Range   WBC 16.8 (H) 4.0 - 10.5 K/uL   RBC 4.50 4.22 - 5.81  MIL/uL   Hemoglobin 13.8 13.0 - 17.0 g/dL   HCT 40.9 81.1 - 91.4 %   MCV 90.7 80.0 - 100.0 fL   MCH 30.7 26.0 - 34.0 pg   MCHC 33.8 30.0 - 36.0 g/dL   RDW 78.2 95.6 - 21.3 %   Platelets 308 150 - 400 K/uL   nRBC 0.0 0.0 - 0.2 %   Neutrophils Relative % 47 %   Neutro Abs 7.9 (H) 1.7 - 7.7 K/uL   Lymphocytes Relative 44 %   Lymphs Abs 7.4 (H) 0.7 - 4.0 K/uL   Monocytes Relative 5 %   Monocytes Absolute 0.8 0.1 - 1.0 K/uL   Eosinophils Relative 4 %   Eosinophils Absolute 0.7 (H) 0.0 - 0.5 K/uL   Basophils Relative 0 %   Basophils Absolute 0.0 0.0 - 0.1 K/uL   nRBC 0 0 /100 WBC   Abs Immature Granulocytes 0.00 0.00 - 0.07 K/uL  Comprehensive metabolic panel     Status: Abnormal   Collection Time: 11/06/23  5:10 AM  Result Value Ref Range   Sodium 138 135 - 145 mmol/L   Potassium 4.2 3.5 - 5.1 mmol/L   Chloride 101 98 - 111 mmol/L   CO2 28 22 - 32 mmol/L   Glucose, Bld 80 70 - 99 mg/dL   BUN 9 6 - 20 mg/dL   Creatinine, Ser 0.86 0.61 - 1.24 mg/dL   Calcium 9.2 8.9 - 57.8 mg/dL   Total Protein 5.6 (L) 6.5 - 8.1 g/dL   Albumin 4.2 3.5 - 5.0 g/dL   AST 20 15 - 41 U/L   ALT 34 0 - 44 U/L   Alkaline Phosphatase 21 (L) 38 - 126 U/L   Total Bilirubin 0.7 0.0 - 1.2 mg/dL   GFR, Estimated >46 >96 mL/min   Anion gap 9 5 - 15  Magnesium     Status: None   Collection Time: 11/06/23  5:10 AM  Result Value Ref Range   Magnesium 2.3 1.7 - 2.4 mg/dL   No results found.  Assessment/Plan: Diagnosis: Acute exacerbation of CIDP Does the need for close, 24 hr/day medical supervision in concert with the patient's rehab needs make it unreasonable for this patient to be served in a less intensive setting? Yes Co-Morbidities requiring supervision/potential complications: Amxiety, insomnia,  hyponatremia, hypokalemia, hypertension, hypothyroidism, anxiety,  and chronic pain with worsening neuropathy. Due to safety, disease management, medication administration, pain management, and patient  education, does the patient require 24 hr/day rehab nursing? Yes Does the patient require coordinated care of a physician, rehab nurse, therapy disciplines of PT, OT to address physical and functional deficits in the context of the above medical diagnosis(es)? Yes Addressing deficits in the following areas: balance, endurance, locomotion, strength, transferring, bathing, dressing, feeding, grooming, and toileting Can the patient actively participate in an intensive therapy program of at least 3 hrs of therapy per day at least 5 days per week? Yes The potential for patient to make measurable gains while on inpatient rehab is good Anticipated functional outcomes upon discharge from inpatient rehab are modified independent and supervision  with PT, modified independent and supervision with OT. Estimated rehab length of stay to reach the above functional goals is: 7-10 days Anticipated discharge destination: Home Overall Rehab/Functional Prognosis: good  POST ACUTE RECOMMENDATIONS: This patient's condition is appropriate for continued rehabilitative care in the following setting: CIR Patient has agreed to participate in recommended program. Yes Note that insurance prior authorization may be required for reimbursement for recommended care.  Comment: Jonathan Luna is an excellent candidate for inpatient rehab.  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  He has an excellent dispo plan with a single story home, and wife who can provide support.  He is motivated to participate in inpatient rehab, and is anticipated to make good gains with an intensive therapy program.    MEDICAL RECOMMENDATIONS: Significant panic disorder/anxiety pre-existing now likely exacerbated by steroids and medical decline, consider initiation of Buspar 5 mg TID for  symptoms Anxiety interrupting sleep, schedule at bedtime melatonin and consider zolpidem or current clonazepam at bedtime to promote sleep.  Communicated with primary team per patient request regarding worsening sensory loss in his legs; no concerning findings on exam.   I have personally performed a face to face diagnostic evaluation of this patient. Additionally, I have examined the patient's medical record including any pertinent labs and radiographic images. If the physician assistant has documented in this note, I have reviewed and edited or otherwise concur with the physician assistant's documentation.  Thanks,  [name], DO 11/06/2023

## 2023-11-06 NOTE — Telephone Encounter (Signed)
Attempted to call patient regarding upcoming cardiac CT appointment. Left message on voicemail with name and callback number Rockwell Alexandria RN Navigator Cardiac Imaging [facility] [phone] Office [phone] Cell

## 2023-11-06 NOTE — Progress Notes (Signed)
Occupational Therapy Treatment Patient Details Name: Jonathan Luna MRN: [id number] DOB: 10-30-1965 Today's Date: 11/06/2023   History of present illness Pt is a 58 y/o male admitted 10/29/23  with worsening weakness and gait instability from neurology clinic. 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 To have plasma exchange therapy. Dialysis catheter RIJ placed.  PMHx  HTN, HLD, chronic pain, neuropathy, gait abnormality, CIDP   OT comments  Pt. Seen for skilled OT treatment session.  Pt. Eager and agreeable to participation.  Pt. Able to sit un supported eob for B UE/fine motor exercises.  Good technique for known exercises. Receptive and excited to try to additional exercises and modifications for management of condiments and self care items introduced today.  Pt. Verbalized understanding of need for rest breaks for prevention of overexertion.  Remains and excellent candidate for continued therapies > 3/hrs a day.  Cont. With acute OT POC.         If plan is discharge home, recommend the following:  A little help with walking and/or transfers;A lot of help with bathing/dressing/bathroom;Assistance with cooking/housework;Assist for transportation;Help with stairs or ramp for entrance   Equipment Recommendations  BSC/3in1;Other (comment)    Recommendations for Other Services PT consult;Rehab consult    Precautions / Restrictions Precautions Precautions: Fall       Mobility Bed Mobility                    Transfers                         Balance                                           ADL either performed or assessed with clinical judgement   ADL Overall ADL's : Needs assistance/impaired             Lower Body Bathing: Sitting/lateral leans;Maximal assistance Lower Body Bathing Details (indicate cue type and reason): supported afo/shoe and had pt. practice management of velcro required max a,  attempted shoe laces pt. able to do the initial crossing of laces but unable to hold and make the bow portions.  reviewed importance of cont. work with "pinch" exercises with theraputty and foam as precurser to gain strength for this portion of managing shoes/afos                       General ADL Comments: session focused on UE exercises and seated ADL    Extremity/Trunk Assessment              Vision       Perception     Praxis      Cognition Arousal: Alert Behavior During Therapy: WFL for tasks assessed/performed Overall Cognitive Status: Within Functional Limits for tasks assessed                                 General Comments: vebalizing more awareness of understanding about overexertion from previous session/education from other therapists        Exercises Other Exercises Other Exercises: picking up/putting down objects x10 BUE Other Exercises: theraputty, molding into a log and able to initiatate pinch with thumb and index finger , grasp/release of foam, also able to  use other hand to pick it up out of the other hand and and place on table.  reviewed how to hold condiment packages, adl items ect. and integrate use of B hands as able one for stabalization and one for opening.  Also turning pages in magazines or books for cont. Pinch work and control of forearms with controlled movements.  Pt. Also reports he is doing a meditation/visualization with closing eyes and focusing and picturing fingers lifting off of the table when palms are flat. Does this while in bed and also on tray table.      Shoulder Instructions       General Comments      Pertinent Vitals/ Pain       Pain Assessment Pain Assessment: No/denies pain  Home Living                                          Prior Functioning/Environment              Frequency  Min 1X/week        Progress Toward Goals  OT Goals(current goals can now be found in  the care plan section)  Progress towards OT goals: Progressing toward goals     Plan      Co-evaluation                 AM-PAC OT "6 Clicks" Daily Activity     Outcome Measure   Help from another person eating meals?: A Little Help from another person taking care of personal grooming?: A Little Help from another person toileting, which includes using toliet, bedpan, or urinal?: A Little Help from another person bathing (including washing, rinsing, drying)?: A Lot Help from another person to put on and taking off regular upper body clothing?: A Lot Help from another person to put on and taking off regular lower body clothing?: A Lot 6 Click Score: 15    End of Session    OT Visit Diagnosis: Unsteadiness on feet (R26.81);Other abnormalities of gait and mobility (R26.89);Muscle weakness (generalized) (M62.81)   Activity Tolerance Patient tolerated treatment well   Patient Left in bed;with call bell/phone within reach;with bed alarm set   Nurse Communication Other (comment) (secure chat with rn tx session updates)        Time: 5621-3086 OT Time Calculation (min): 27 min  Charges: OT General Charges $OT Visit: 1 Visit OT Treatments $Therapeutic Exercise: 23-37 mins  Boneta Lucks, COTA/L Acute Rehabilitation [phone]   Alessandra Bevels Lorraine-COTA/L  11/06/2023, 9:25 AM

## 2023-11-06 NOTE — Progress Notes (Addendum)
Patient off the unit to HD 1300 Patient back to the unit

## 2023-11-06 NOTE — Progress Notes (Signed)
Inpatient Rehab Coordinator Note:  I met with patient at bedside to discuss CIR recommendations and goals/expectations of CIR stay.  We reviewed 3 hrs/day of therapy, physician follow up, and average length of stay 2 weeks (dependent upon progress) with goals of modified independence.  He has no questions and is hopeful to return to CIR after successful stay earlier this year.  I will submit for CIR prior auth.  Estill Dooms, PT, DPT Admissions Coordinator [phone] 11/06/23  4:00 PM

## 2023-11-06 NOTE — Progress Notes (Signed)
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  Patient was in the hospital last month with similar symptoms and treated with IVIG.  Reported worsening symptoms since last 2 weeks.  Also with upper extremity numbness and weakness.  In the emergency room hemodynamically stable.  Admitted for plasma exchange.   Subjective:   Has been evaluated by rehab today Denies nausea vomiting abdominal pain chest pain     Assessment & Plan:   CIDP: Recurrent weakness and gait abnormality.  Admitted for plasma exchange.  Continue respiratory therapy evaluation, NIF and vital capacity.  All supportive care available.  Stable so far. IR placed temporary catheter right internal jugular.  Plasma exchange 1/17, 1/18, 1/20. Patient for another session of plasmapheresis today Continue on high-dose steroids.   Case discussed with neurology following.  Continue  to work with PT OT.  Will need ongoing therapies.   PT recommends acute rehab   Hyponatremia: Due to dehydration.  Improved with isotonic fluid.   Hypokalemia: Repleted, continue monitoring   Hypocalcemia: Adequate.  On Tums.   Hypertension: Blood pressure is stable on irbesartan and metoprolol.   Hypothyroidism: Continue on thyroid supplementation.   Anxiety disorder: Continue on duloxetine, sertraline and as needed Klonopin.   Neuropathy:  Continue gabapentin and Trileptal    Chronic pain syndrome: As above on Cymbalta gabapentin and as needed oxycodone.  Added gabapentin 900 mg in addition to his 3 times daily doses as as needed.   Nicotine use: Continue nicotine patch   DVT prophylaxis: enoxaparin (LOVENOX) injection 40 mg Start: 10/29/23  1800     Code Status: Full code Family Communication: None today. Disposition Plan: Status is: Inpatient Remains inpatient appropriate because: Active inpatient treatment   Consultants:  Neurology   Procedures:  Plasma exchange   Antimicrobials:  None  Subjective:  Patient seen and examined Denied nausea vomiting abdominal pain or chest pain  Physical Exam:  General exam: Calm and comfortable.  Pleasant and interactive. Respiratory system: No added sounds.  Bilateral clear. Cardiovascular system: S1 & S2 heard, RRR.  No pedal edema. Gastrointestinal system: Soft nontender. Central nervous system: Alert and oriented.  Pleasant interaction. Both upper extremity with improved weakness.  Proximal muscle groups are almost 4/5.  Distal muscle group including wrist extension 2/5.  Left weaker than right. lower extremities weakness, 4/5.  Vitals:   11/06/23 1118 11/06/23 1158 11/06/23 1208 11/06/23 1257  BP: 111/69 124/76 121/78 107/75  Pulse: 80 81 85 91  Resp: 17 16 20 15   Temp: 97.9 F (36.6 C)   98.7 F (37.1 C)  TempSrc: Oral   Oral  SpO2: 94% 96% 94% 96%  Weight: 83.9 kg   83.9 kg  Height:        Data Reviewed:    Latest Ref Rng & Units 11/06/2023    5:10 AM 11/04/2023    8:50 AM 11/02/2023    5:51 AM  BMP  Glucose 70 - 99 mg/dL 80  811  85   BUN 6 - 20 mg/dL 9  11  9    Creatinine 0.61 - 1.24 mg/dL 9.14  7.82  9.56   Sodium 135 - 145 mmol/L 138  138  137   Potassium 3.5 -  5.1 mmol/L 4.2  3.1  3.5   Chloride 98 - 111 mmol/L 101  102  104   CO2 22 - 32 mmol/L 28  27  26    Calcium 8.9 - 10.3 mg/dL 9.2  8.5  8.6        Latest Ref Rng & Units 11/06/2023    5:10 AM 11/04/2023    8:50 AM 11/02/2023    5:51 AM  CBC  WBC 4.0 - 10.5 K/uL 16.8  13.2  11.0   Hemoglobin 13.0 - 17.0 g/dL 82.9  56.2  13.0   Hematocrit 39.0 - 52.0 % 40.8  37.2  38.4   Platelets 150 - 400 K/uL 308  275  246       Author: Loyce Dys, MD 11/06/2023 2:02 PM  For on call review  [email or web address].

## 2023-11-06 NOTE — Progress Notes (Signed)
TPE  11/06/23 1257  Report  Report Received From Delrae Sawyers RN  Vitals  Temp 98.7 F (37.1 C)  Temp Source Oral  Pulse Rate 91  ECG Heart Rate 86  Resp 15  BP 107/75  Oxygen Therapy  SpO2 96 %  O2 Device Room Air  Height and Weight  Weight 83.9 kg  Post-apheresis  Net Removed (mL) 3625 mL  Replacement (mL) 2738 mL  ACDA infused (mL) 742 mL  Total Normal Saline Administered 167 mL  Total Calcium Administered 100 mL  Tolerated Procedure Yes  Post-Procedure Comments Tx. Completed without difficulties. Pt. voice no complaints and Tx. #5 for today. Report called to 3W bedside RN  Hemodialysis Catheter Right Internal jugular Triple lumen Temporary (Non-Tunneled)  Placement Date/Time: 10/29/23 1718   Serial / Lot #: [id number]  Expiration Date: 04/11/28  Time Out: Correct patient;Correct site;Correct procedure  Maximum sterile barrier precautions: Hand hygiene;Cap;Mask;Sterile gown;Sterile gloves;Large sterile s...  Site Condition No complications  Blue Lumen Status Flushed;Dead end cap in place;Heparin locked;Blood return noted  Red Lumen Status Flushed;Dead end cap in place;Heparin locked;Blood return noted  Purple Lumen Status Saline locked  Catheter fill solution Heparin 1000 units/ml  Catheter fill volume (Arterial) 1.3 cc  Catheter fill volume (Venous) 1.3  Dressing Type Transparent  Dressing Status Antimicrobial disc/dressing in place;Clean, Dry, Intact  Drainage Description None  Dressing Change Due 11/12/23  Post treatment catheter status Capped and Clamped   Received patient in bed to unit.  Alert and oriented.  Informed consent signed and in chart.   TX duration:  Patient tolerated well.  Transported back to the room  Alert, without acute distress.  Hand-off given to patient's nurse.   Access used: Yes Access issues: No  Total UF removed: 3625 Medication(s) given: See MAR Post HD VS: See Above Grid Post HD weight: 83.9 kg   [facility]

## 2023-11-06 NOTE — Plan of Care (Signed)
Problem: Education: Goal: Knowledge of General Education information will improve Description Including pain rating scale, medication(s)/side effects and non-pharmacologic comfort measures Outcome: Progressing   Problem: Clinical Measurements: Goal: Respiratory complications will improve Outcome: Progressing Goal: Cardiovascular complication will be avoided Outcome: Progressing   Problem: Activity: Goal: Risk for activity intolerance will decrease Outcome: Progressing   Problem: Nutrition: Goal: Adequate nutrition will be maintained Outcome: Progressing   Problem: Elimination: Goal: Will not experience complications related to urinary retention Outcome: Progressing   Problem: Safety: Goal: Ability to remain free from injury will improve Outcome: Progressing   Problem: Skin Integrity: Goal: Risk for impaired skin integrity will decrease Outcome: Progressing

## 2023-11-06 NOTE — Progress Notes (Signed)
Pt performed with great effort. NIF: -40 cmH2) VC: not available; IS: 2500

## 2023-11-07 DIAGNOSIS — G6181 Chronic inflammatory demyelinating polyneuritis: Secondary | ICD-10-CM | POA: Diagnosis not present

## 2023-11-07 LAB — BASIC METABOLIC PANEL
Anion gap: 8 (ref 5–15)
BUN: 11 mg/dL (ref 6–20)
CO2: 26 mmol/L (ref 22–32)
Calcium: 8.8 mg/dL — ABNORMAL LOW (ref 8.9–10.3)
Chloride: 105 mmol/L (ref 98–111)
Creatinine, Ser: 0.96 mg/dL (ref 0.61–1.24)
GFR, Estimated: >60 mL/min (ref >60)
Glucose, Bld: 80 mg/dL (ref 70–99)
Potassium: 3.9 mmol/L (ref 3.5–5.1)
Sodium: 139 mmol/L (ref 135–145)

## 2023-11-07 LAB — CBC
HCT: 39.4 % (ref 39.0–52.0)
Hemoglobin: 13.4 g/dL (ref 13.0–17.0)
MCH: 30.9 pg (ref 26.0–34.0)
MCHC: 34 g/dL (ref 30.0–36.0)
MCV: 91 fL (ref 80.0–100.0)
Platelets: 306 10*3/uL (ref 150–400)
RBC: 4.33 MIL/uL (ref 4.22–5.81)
RDW: 12.8 % (ref 11.5–15.5)
WBC: 19.3 10*3/uL — ABNORMAL HIGH (ref 4.0–10.5)
nRBC: 0 % (ref 0.0–0.2)

## 2023-11-07 NOTE — Progress Notes (Signed)
Progress Note   Patient: Jonathan Luna GEX:[id number] DOB: [birth year] DOA: 10/29/2023     9 DOS: the patient was seen and examined on 11/07/2023     Brief Narrative:  58 year old with history of hypertension, hyperlipidemia, hypothyroidism and anxiety , neuropathy and CIDP presented with worsening weakness and gait instability from neurology clinic.  Patient was in the hospital last month with similar symptoms and treated with IVIG.  Reported worsening symptoms since last 2 weeks.  Also with upper extremity numbness and weakness.  In the emergency room hemodynamically stable.  Admitted for plasma exchange.   Subjective:   Has been evaluated by rehab today Denies nausea vomiting abdominal pain chest pain     Assessment & Plan:   CIDP: Recurrent weakness and gait abnormality.  Admitted for plasma exchange.  Continue respiratory therapy evaluation, NIF and vital capacity.  All supportive care available.  Stable so far. IR placed temporary catheter right internal jugular.  Plasma exchange 1/17, 1/18, 1/20. Patient underwent last session of plasmapheresis on 11/05/2022 Spoke with Dr. Cordie Grice neurologist who recommended continuation of prednisone 60 mg daily until patient follows up with Dr. Terrace Arabia. Order placed for central line to be removed At discharge to consider addition of PJP prophylaxis as recommended by neuro Case discussed with neurology following.  Continue  to work with PT OT.  PT recommends acute rehab   Hyponatremia: Due to dehydration.  Improved with isotonic fluid.   Hypokalemia: Repleted, continue monitoring   Hypocalcemia: Adequate.  On Tums.   Hypertension: Blood pressure is stable on irbesartan and metoprolol.   Hypothyroidism: Continue on thyroid supplementation.   Anxiety disorder: Continue on duloxetine, sertraline and as needed Klonopin.   Neuropathy:  Continue gabapentin and Trileptal    Chronic pain syndrome: As above on Cymbalta gabapentin and as needed  oxycodone.  Added gabapentin 900 mg in addition to his 3 times daily doses as as needed.   Nicotine use: Continue nicotine patch   DVT prophylaxis: enoxaparin (LOVENOX) injection 40 mg Start: 10/29/23 1800     Code Status: Full code Family Communication: None today. Disposition Plan: Status is: Inpatient Remains inpatient appropriate because: Active inpatient treatment   Consultants:  Neurology   Procedures:  Plasma exchange   Antimicrobials:  None   Subjective:  Patient seen and examined Denies nausea vomiting abdominal pain Did complain of some numbness that is not new Order placed for central line to be removed   Physical Exam:   General exam: Calm and comfortable.  Pleasant and interactive. Respiratory system: No added sounds.  Bilateral clear. Cardiovascular system: S1 & S2 heard, RRR.  No pedal edema. Gastrointestinal system: Soft nontender. Central nervous system: Alert and oriented.  Pleasant interaction. Both upper extremity with improved weakness.  Proximal muscle groups are almost 4/5.  Distal muscle group including wrist extension 2/5.  Left weaker than right. lower extremities weakness, 4/5.   Data reviewed:    Latest Ref Rng & Units 11/07/2023    5:24 AM 11/06/2023    5:10 AM 11/04/2023    8:50 AM  CBC  WBC 4.0 - 10.5 K/uL 19.3  16.8  13.2   Hemoglobin 13.0 - 17.0 g/dL 01.0  27.2  53.6   Hematocrit 39.0 - 52.0 % 39.4  40.8  37.2   Platelets 150 - 400 K/uL 306  308  275        Latest Ref Rng & Units 11/07/2023    5:24 AM 11/06/2023    5:10 AM 11/04/2023  8:50 AM  BMP  Glucose 70 - 99 mg/dL 80  80  098   BUN 6 - 20 mg/dL 11  9  11    Creatinine 0.61 - 1.24 mg/dL 1.19  1.47  8.29   Sodium 135 - 145 mmol/L 139  138  138   Potassium 3.5 - 5.1 mmol/L 3.9  4.2  3.1   Chloride 98 - 111 mmol/L 105  101  102   CO2 22 - 32 mmol/L 26  28  27    Calcium 8.9 - 10.3 mg/dL 8.8  9.2  8.5     Vitals:   11/07/23 0500 11/07/23 0916 11/07/23 0916 11/07/23 1109   BP:  110/68 108/66 112/73  Pulse:  70 82 78  Resp:   17 17  Temp:   98.3 F (36.8 C) 98.1 F (36.7 C)  TempSrc:   Oral Oral  SpO2:   98% 98%  Weight: 81.6 kg     Height:        Author: Loyce Dys, MD 11/07/2023 3:25 PM  For on call review [email or web address].

## 2023-11-07 NOTE — Plan of Care (Signed)
Problem: Education: Goal: Knowledge of General Education information will improve Description Including pain rating scale, medication(s)/side effects and non-pharmacologic comfort measures Outcome: Progressing   Problem: Health Behavior/Discharge Planning: Goal: Ability to manage health-related needs will improve Outcome: Progressing

## 2023-11-07 NOTE — Plan of Care (Signed)
  Problem: Education: Goal: Knowledge of General Education information will improve Description: Including pain rating scale, medication(s)/side effects and non-pharmacologic comfort measures Outcome: Progressing   Problem: Clinical Measurements: Goal: Ability to maintain clinical measurements within normal limits will improve Outcome: Progressing Goal: Respiratory complications will improve Outcome: Progressing Goal: Cardiovascular complication will be avoided Outcome: Progressing   Problem: Activity: Goal: Risk for activity intolerance will decrease Outcome: Progressing   Problem: Nutrition: Goal: Adequate nutrition will be maintained Outcome: Progressing   Problem: Coping: Goal: Level of anxiety will decrease Outcome: Progressing   Problem: Elimination: Goal: Will not experience complications related to bowel motility Outcome: Progressing Goal: Will not experience complications related to urinary retention Outcome: Progressing   Problem: Pain Managment: Goal: General experience of comfort will improve and/or be controlled Outcome: Progressing   Problem: Safety: Goal: Ability to remain free from injury will improve Outcome: Progressing

## 2023-11-07 NOTE — Plan of Care (Signed)
Alert and oriented. OOB to bathroom with walker and standby staff assistance.  Bowel movement noted today.  Medicated for pain with PRN pain medications, see MAR for details.   Problem: Education: Goal: Knowledge of General Education information will improve Description: Including pain rating scale, medication(s)/side effects and non-pharmacologic comfort measures Outcome: Progressing   Problem: Health Behavior/Discharge Planning: Goal: Ability to manage health-related needs will improve Outcome: Progressing   Problem: Clinical Measurements: Goal: Ability to maintain clinical measurements within normal limits will improve Outcome: Progressing Goal: Will remain free from infection Outcome: Progressing Goal: Diagnostic test results will improve Outcome: Progressing Goal: Respiratory complications will improve Outcome: Progressing Goal: Cardiovascular complication will be avoided Outcome: Progressing   Problem: Activity: Goal: Risk for activity intolerance will decrease Outcome: Progressing   Problem: Nutrition: Goal: Adequate nutrition will be maintained Outcome: Progressing   Problem: Coping: Goal: Level of anxiety will decrease Outcome: Progressing   Problem: Elimination: Goal: Will not experience complications related to bowel motility Outcome: Progressing Goal: Will not experience complications related to urinary retention Outcome: Progressing   Problem: Pain Managment: Goal: General experience of comfort will improve and/or be controlled Outcome: Progressing   Problem: Safety: Goal: Ability to remain free from injury will improve Outcome: Progressing   Problem: Skin Integrity: Goal: Risk for impaired skin integrity will decrease Outcome: Progressing

## 2023-11-07 NOTE — Progress Notes (Shared)
To be seen again

## 2023-11-07 NOTE — Progress Notes (Signed)
CVC was removed per order with no complications.  Pt supine and suspended inspiration during removal with instructions to remain supine for 30 minutes after removal.  Pressure held to achieve hemostasis.  Vaseline/gauze/tegaderm applied.  Patient education provided regarding lifting restrictions, site care, and signs of infection.  Pt verbalized understanding and had no questions.

## 2023-11-07 NOTE — Progress Notes (Signed)
NIF-40 IS-2500  With great patient effort.

## 2023-11-08 DIAGNOSIS — G6181 Chronic inflammatory demyelinating polyneuritis: Secondary | ICD-10-CM | POA: Diagnosis not present

## 2023-11-08 LAB — CBC
HCT: 40.7 % (ref 39.0–52.0)
Hemoglobin: 13.7 g/dL (ref 13.0–17.0)
MCH: 30.3 pg (ref 26.0–34.0)
MCHC: 33.7 g/dL (ref 30.0–36.0)
MCV: 90 fL (ref 80.0–100.0)
Platelets: 332 10*3/uL (ref 150–400)
RBC: 4.52 MIL/uL (ref 4.22–5.81)
RDW: 12.6 % (ref 11.5–15.5)
WBC: 16.1 10*3/uL — ABNORMAL HIGH (ref 4.0–10.5)
nRBC: 0 % (ref 0.0–0.2)

## 2023-11-08 LAB — BASIC METABOLIC PANEL
Anion gap: 9 (ref 5–15)
BUN: 10 mg/dL (ref 6–20)
CO2: 25 mmol/L (ref 22–32)
Calcium: 9 mg/dL (ref 8.9–10.3)
Chloride: 102 mmol/L (ref 98–111)
Creatinine, Ser: 0.94 mg/dL (ref 0.61–1.24)
GFR, Estimated: >60 mL/min (ref >60)
Glucose, Bld: 83 mg/dL (ref 70–99)
Potassium: 3.9 mmol/L (ref 3.5–5.1)
Sodium: 136 mmol/L (ref 135–145)

## 2023-11-08 NOTE — Progress Notes (Signed)
Progress Note   Patient: Jonathan Luna ZOX:[id number] DOB: 05-19-1966 DOA: 10/29/2023     10 DOS: the patient was seen and examined on 11/08/2023     Brief Narrative:  58 year old with history of hypertension, hyperlipidemia, hypothyroidism and anxiety , neuropathy and CIDP presented with worsening weakness and gait instability from neurology clinic.  Patient was in the hospital last month with similar symptoms and treated with IVIG.  Reported worsening symptoms since last 2 weeks.  Also with upper extremity numbness and weakness.  In the emergency room hemodynamically stable.  Admitted for plasma exchange.     Assessment & Plan:   CIDP: Recurrent weakness and gait abnormality.  Admitted for plasma exchange.  Continue respiratory therapy evaluation, NIF and vital capacity.  All supportive care available.  Stable so far. IR placed temporary catheter right internal jugular.  Plasma exchange 1/17, 1/18, 1/20. Patient underwent last session of plasmapheresis on 11/05/2022 Spoke with Dr. Cordie Grice neurologist who recommended continuation of prednisone 60 mg daily until patient follows up with Dr. Terrace Arabia. Order placed for central line to be removed At discharge to consider addition of PJP prophylaxis as recommended by neuro Case discussed with neurology following.  Continue  to work with PT OT.  PT recommends acute rehab   Hyponatremia: Due to dehydration.  Improved with isotonic fluid.   Hypokalemia: Repleted, continue monitoring   Hypocalcemia: Adequate.  On Tums.   Hypertension: Blood pressure is stable on irbesartan and metoprolol.   Hypothyroidism: Continue on thyroid supplementation.   Anxiety disorder: Continue on duloxetine, sertraline and as needed Klonopin.   Neuropathy:  Continue gabapentin and Trileptal    Chronic pain syndrome: As above on Cymbalta gabapentin and as needed oxycodone.  Added gabapentin 900 mg in addition to his 3 times daily doses as as needed.   Nicotine use:  Continue nicotine patch   DVT prophylaxis: enoxaparin (LOVENOX) injection 40 mg Start: 10/29/23 1800     Code Status: Full code Family Communication: None today. Disposition Plan: Status is: Inpatient Remains inpatient appropriate because: Active inpatient treatment   Consultants:  Neurology   Procedures:  Plasma exchange   Antimicrobials:  None   Subjective:  No significant events overnight, patient was concerned of using steroids for long-term.  Patient wants to ask neurology regarding stopping prednisone.  Patient was resting comfortably, denied any complaints.  Awaiting for rehab.    Physical Exam: General exam: Calm and comfortable.  Pleasant and interactive. Respiratory system: No added sounds.  Bilateral clear. Cardiovascular system: S1 & S2 heard, RRR.  No pedal edema. Gastrointestinal system: Soft nontender. Central nervous system: Alert and oriented.  Pleasant interaction. Both upper extremity with improved weakness.  Proximal muscle groups are almost 4/5.  Distal muscle group including wrist extension 2/5.  Left weaker than right. lower extremities weakness, 4/5.   Data reviewed:    Latest Ref Rng & Units 11/08/2023    6:59 AM 11/07/2023    5:24 AM 11/06/2023    5:10 AM  CBC  WBC 4.0 - 10.5 K/uL 16.1  19.3  16.8   Hemoglobin 13.0 - 17.0 g/dL 81.1  91.4  78.2   Hematocrit 39.0 - 52.0 % 40.7  39.4  40.8   Platelets 150 - 400 K/uL 332  306  308        Latest Ref Rng & Units 11/08/2023    6:59 AM 11/07/2023    5:24 AM 11/06/2023    5:10 AM  BMP  Glucose 70 - 99 mg/dL  83  80  80   BUN 6 - 20 mg/dL 10  11  9    Creatinine 0.61 - 1.24 mg/dL 1.32  4.40  1.02   Sodium 135 - 145 mmol/L 136  139  138   Potassium 3.5 - 5.1 mmol/L 3.9  3.9  4.2   Chloride 98 - 111 mmol/L 102  105  101   CO2 22 - 32 mmol/L 25  26  28    Calcium 8.9 - 10.3 mg/dL 9.0  8.8  9.2     Vitals:   11/08/23 0402 11/08/23 0755 11/08/23 0756 11/08/23 1135  BP: 102/60 126/78 126/78 129/79   Pulse: 69 83 83 86  Resp: 18 17  17   Temp: 98.6 F (37 C) 97.9 F (36.6 C)  97.8 F (36.6 C)  TempSrc: Oral Oral  Oral  SpO2: 99% 96%  96%  Weight:      Height:        Author: Gillis Santa, MD 11/08/2023 2:32 PM  For on call review [email or web address].

## 2023-11-08 NOTE — Progress Notes (Signed)
NEUROLOGY CONSULT FOLLOW UP NOTE   Date of service: November 08, 2023 Patient Name: Jonathan Luna MRN:  [id number] DOB:  18-Nov-1965  Interval Hx/subjective   - Feeling good at this time  - Follow-up at patients request to address additional questions  Vitals   Vitals:   11/08/23 0756 11/08/23 1135 11/08/23 1559 11/08/23 2017  BP: 126/78 129/79 135/80 (!) 149/90  Pulse: 83 86 84 83  Resp:  17 17 18   Temp:  97.8 F (36.6 C) 97.8 F (36.6 C) (!) 97.4 F (36.3 C)  TempSrc:  Oral Oral Oral  SpO2:  96% 93% 95%  Weight:      Height:         Body mass index is 28.18 kg/m.  Physical Exam   Constitutional: Appears well-developed and well-nourished.  Psych: Affect appropriate to situation.  Eyes: No scleral injection.  HENT: No OP obstrucion.  Head: Normocephalic.  Cardiovascular: Normal rate and regular rhythm.  Respiratory: Effort normal, non-labored breathing.  GI: Soft.  No distension. There is no tenderness.  Skin: WDI.   Neurologic Examination   Neuro: Mental Status: Patient is awake, alert, oriented to person, place, month, year, and situation. Patient is able to give a clear and coherent history. 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 End gaze nystagmus somewhat more prominent on left gaze V: Facial sensation is symmetric to temperature VII: Facial movement is symmetric.  VIII: hearing is intact to voice X: Phonation is normal XI: Shoulder shrug is symmetric. XII: tongue is midline without atrophy or fasciculations.  Motor: Overall, stronger proximally than distally Neck flexion 4+/5 neck extension 4+/5 (slightly limited by  catheter discomfort) BUE: 4+/5 shoulder, 4/5 bicep/tricep, 3/5 grips bilaterally, 2/5 finger spread  Finger flexion is 4/5, finger extension is 1-2/5  Wrist flexion is 4/5, wrist extension is 1-2/5  Overall slightly weaker in the left upper extremity than the right upper extremity (4 - on the left, 4+ on the right in the biceps and triceps for example) BLE: 4+/5 hip flexion, 4/5  knee flexion, 3/5 dorsal and plantar flexion Sensory: Reduced throughout lower left part of face, but improving. Sensation intact to light touch in BLE and BUE, improved per patient with isolated numbness in quadriceps area bilaterally Deep Tendon Reflexes: Absent  Cerebellar: FNF intact bilaterally  Medications  Current Facility-Administered Medications:    acetaminophen (TYLENOL) tablet 650 mg, 650 mg, Oral, Q6H PRN, 650 mg at 11/04/23 2056 **OR** acetaminophen (TYLENOL) suppository 650 mg, 650 mg, Rectal, Q6H PRN, Synetta Fail, MD   acetaminophen (TYLENOL) tablet 650 mg, 650 mg, Oral, Q4H PRN, Rejeana Brock, MD, 650 mg at 11/06/23 0939   albuterol (VENTOLIN HFA) 108 (90 Base) MCG/ACT inhaler 2 puff, 2 puff, Inhalation, Q4H PRN, Dorcas Carrow, MD, 2 puff at 11/06/23 0816   aspirin EC tablet 81 mg, 81 mg, Oral, Daily, Dorcas Carrow, MD, 81 mg at 11/08/23 1002   calcium carbonate (TUMS - dosed in mg elemental calcium) chewable tablet 200 mg of elemental calcium, 1 tablet, Oral, BID PRN, Howerter, Justin B, DO, 200 mg of elemental calcium at 11/05/23 2003   citrate dextrose (ACD-A anticoagulant) solution 1,000 mL, 1,000 mL, Other, Continuous,  Rejeana Brock, MD, 1,000 mL at 11/06/23 1113   clonazePAM (KLONOPIN) tablet 0.5 mg, 0.5 mg, Oral, BID PRN, Synetta Fail, MD, 0.5 mg at 11/08/23 1415   dextromethorphan-guaiFENesin (MUCINEX DM) 30-600 MG per 12 hr tablet 1 tablet, 1 tablet, Oral, BID, Dorcas Carrow, MD, 1 tablet at 11/08/23 1002   diphenhydrAMINE (BENADRYL) capsule 25 mg, 25 mg,  Oral, Q6H PRN, Rejeana Brock, MD, 25 mg at 11/07/23 1951   DULoxetine (CYMBALTA) DR capsule 60 mg, 60 mg, Oral, Daily, Synetta Fail, MD, 60 mg at 11/08/23 1002   enoxaparin (LOVENOX) injection 40 mg, 40 mg, Subcutaneous, Q24H, Synetta Fail, MD, 40 mg at 11/08/23 1840   gabapentin (NEURONTIN) capsule 900 mg, 900 mg, Oral, TID, Synetta Fail, MD, 900 mg at 11/08/23 1603   gabapentin (NEURONTIN) capsule 900 mg, 900 mg, Oral, Daily PRN, Dorcas Carrow, MD, 900 mg at 11/05/23 1900   guaiFENesin-dextromethorphan (ROBITUSSIN DM) 100-10 MG/5ML syrup 5 mL, 5 mL, Oral, Q4H PRN, Synetta Fail, MD, 5 mL at 10/30/23 1610   heparin sodium (porcine) injection 1,000 Units, 1,000 Units, Intracatheter, Once, Rejeana Brock, MD   irbesartan (AVAPRO) tablet 300 mg, 300 mg, Oral, Daily, Synetta Fail, MD, 300 mg at 11/08/23 1002   levothyroxine (SYNTHROID) tablet 125 mcg, 125 mcg, Oral, Q0600, Synetta Fail, MD, 125 mcg at 11/08/23 0423   metoprolol succinate (TOPROL-XL) 24 hr tablet 25 mg, 25 mg, Oral, Daily, Synetta Fail, MD, 25 mg at 11/08/23 1002   nicotine polacrilex (NICORETTE) gum 2 mg, 2 mg, Oral, PRN, John Giovanni, MD, 2 mg at 10/30/23 0544   Oral care mouth rinse, 15 mL, Mouth Rinse, PRN, Synetta Fail, MD   oxyCODONE-acetaminophen (PERCOCET/ROXICET) 5-325 MG per tablet 1 tablet, 1 tablet, Oral, Q4H PRN, Dorcas Carrow, MD, 1 tablet at 11/08/23 1840   polyethylene glycol (MIRALAX / GLYCOLAX) packet 17 g, 17 g, Oral, Daily PRN, Synetta Fail, MD   predniSONE (DELTASONE) tablet 60 mg, 60 mg, Oral, Q breakfast, Hetty Blend C, NP, 60 mg at 11/08/23 9604   sertraline (ZOLOFT) tablet 25 mg, 25 mg, Oral, Daily, Synetta Fail, MD, 25 mg at 11/08/23 1002  Labs and Diagnostic Imaging   CBC:  Recent Labs  Lab 11/04/23 0850 11/06/23 0510 11/07/23 0524 11/08/23 0659  WBC 13.2* 16.8* 19.3* 16.1*  NEUTROABS 6.8 7.9*  --   --    HGB 13.2 13.8 13.4 13.7  HCT 37.2* 40.8 39.4 40.7  MCV 89.0 90.7 91.0 90.0  PLT 275 308 306 332    Basic Metabolic Panel:  Lab Results  Component Value Date   NA 136 11/08/2023   K 3.9 11/08/2023   CO2 25 11/08/2023   GLUCOSE 83 11/08/2023   BUN 10 11/08/2023   CREATININE 0.94 11/08/2023   CALCIUM 9.0 11/08/2023   GFRNONAA >60 11/08/2023   GFRAA 94 08/16/2008   Lipid Panel:  Lab Results  Component Value Date   LDLCALC 106 (H) 10/08/2023   HgbA1c:  Lab Results  Component Value Date   HGBA1C 5.3 09/22/2023   Urine Drug Screen:     Component Value Date/Time   LABOPIA NEG 09/29/2012 0830   COCAINSCRNUR Negative 08/18/2023 1114   COCAINSCRNUR NEG 09/29/2012 0830   LABBENZ POS (A) 09/29/2012 0830   AMPHETMU NEG 09/29/2012 0830    Alcohol Level No results found for: "ETH" INR  Lab Results  Component Value Date   INR 1.0 08/29/2023   APTT  Lab Results  Component Value Date   APTT 32 08/29/2023   AED levels: No results found for: "PHENYTOIN", "ZONISAMIDE", "LAMOTRIGINE", "LEVETIRACETA"  10/25/23 CT Head without contrast(Personally reviewed): - No acute findings  12/19 MRI Brain(Personally reviewed): - Normal brain MRI   NIF/VC  11/20- NIF -40 VC atleast 2.5L.  Pt performed with great effort.   NIF/VC 11/17 - NIF -40  NIF/VC 1/23 NIF: -40 cm of water  VC: Not available   Assessment   MARKEY DEADY is a 58 y.o. male who has been recently diagnosed with CIDP and was recently discharged from rehab on 10/20/2023. He completed 5 days of IVIG mid-December 2024. Discharged to CIR, discharged 10/20/23. Followed up with outpatient neurologist, Dr. Terrace Arabia, who sent patient to ED due to progressive weakness and worsening symptoms. He is having trouble ambulating and doing simple tasks. He was sent from neurology office for admission and initiation of Plex treatments.  Overall weakness, stronger distally then proximal. He only had a short duration of smyptom improvement  after last month's IVIG treatments.   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   Risks benefits of prednisone discussed with patient  Recommendations   - PLEX 1/17, 1/18, 1/20, 1/22, 1/24 (last treatment due tomorrow) - Continue 60mg  Prednisone maintenance steroids until follow-up with Dr. Terrace Arabia (typically used for 1-2 months with taper afterwards but will depend on clinical course) - Additionally recommend patient continue vitamin D/calcium supplements while on steroids - Close outpatient follow-up with Dr. Terrace Arabia, if continued on prednisone greater than 20 mg daily dose for more than 1-2 months would consider PJP prophylaxis  - Continue home gabapentin for neuropathic pain - space VC and NiF to daily - Continue PT/OT - Neurology will again sign off  ______________________________________________________________________   Brooke Dare MD-PhD [facility] [phone] Available 7 AM to 7 PM, outside these hours please contact Neurologist on call listed on AMION

## 2023-11-08 NOTE — Progress Notes (Signed)
Pt obtained >-40cmH2o on two attempts with great effort on NIF.

## 2023-11-08 NOTE — Plan of Care (Signed)
Alert and oriented x4. Able to dangle on edge of bed.  Medicated for pain with PRN, see MAR for details.   Problem: Education: Goal: Knowledge of General Education information will improve Description: Including pain rating scale, medication(s)/side effects and non-pharmacologic comfort measures Outcome: Progressing   Problem: Health Behavior/Discharge Planning: Goal: Ability to manage health-related needs will improve Outcome: Progressing   Problem: Clinical Measurements: Goal: Ability to maintain clinical measurements within normal limits will improve Outcome: Progressing Goal: Will remain free from infection Outcome: Progressing Goal: Diagnostic test results will improve Outcome: Progressing Goal: Respiratory complications will improve Outcome: Progressing Goal: Cardiovascular complication will be avoided Outcome: Progressing   Problem: Activity: Goal: Risk for activity intolerance will decrease Outcome: Progressing   Problem: Nutrition: Goal: Adequate nutrition will be maintained Outcome: Progressing   Problem: Coping: Goal: Level of anxiety will decrease Outcome: Progressing   Problem: Elimination: Goal: Will not experience complications related to bowel motility Outcome: Progressing Goal: Will not experience complications related to urinary retention Outcome: Progressing   Problem: Pain Managment: Goal: General experience of comfort will improve and/or be controlled Outcome: Progressing   Problem: Safety: Goal: Ability to remain free from injury will improve Outcome: Progressing   Problem: Skin Integrity: Goal: Risk for impaired skin integrity will decrease Outcome: Progressing

## 2023-11-09 ENCOUNTER — Ambulatory Visit (HOSPITAL_COMMUNITY)
Admission: RE | Admit: 2023-11-09 | Discharge: 2023-11-09 | Disposition: A | Discharge status: Home / Self Care | Payer: 59 | Source: Ambulatory Visit | Attending: Internal Medicine | Admitting: Internal Medicine

## 2023-11-09 DIAGNOSIS — G6181 Chronic inflammatory demyelinating polyneuritis: Secondary | ICD-10-CM | POA: Diagnosis not present

## 2023-11-09 LAB — CBC
HCT: 40.7 % (ref 39.0–52.0)
Hemoglobin: 13.7 g/dL (ref 13.0–17.0)
MCH: 30.6 pg (ref 26.0–34.0)
MCHC: 33.7 g/dL (ref 30.0–36.0)
MCV: 91.1 fL (ref 80.0–100.0)
Platelets: 341 10*3/uL (ref 150–400)
RBC: 4.47 MIL/uL (ref 4.22–5.81)
RDW: 12.7 % (ref 11.5–15.5)
WBC: 15.7 10*3/uL — ABNORMAL HIGH (ref 4.0–10.5)
nRBC: 0 % (ref 0.0–0.2)

## 2023-11-09 LAB — BASIC METABOLIC PANEL
Anion gap: 9 (ref 5–15)
BUN: 12 mg/dL (ref 6–20)
CO2: 24 mmol/L (ref 22–32)
Calcium: 9.1 mg/dL (ref 8.9–10.3)
Chloride: 105 mmol/L (ref 98–111)
Creatinine, Ser: 0.74 mg/dL (ref 0.61–1.24)
GFR, Estimated: >60 mL/min (ref >60)
Glucose, Bld: 77 mg/dL (ref 70–99)
Potassium: 4.2 mmol/L (ref 3.5–5.1)
Sodium: 138 mmol/L (ref 135–145)

## 2023-11-09 LAB — PHOSPHORUS: Phosphorus: 3.7 mg/dL (ref 2.5–4.6)

## 2023-11-09 LAB — MAGNESIUM: Magnesium: 2.2 mg/dL (ref 1.7–2.4)

## 2023-11-09 NOTE — Progress Notes (Signed)
Pt preformed with good effort: NIF >-40  VC 2500 ML with IS

## 2023-11-09 NOTE — Plan of Care (Signed)
Problem: Education: Goal: Knowledge of General Education information will improve Description Including pain rating scale, medication(s)/side effects and non-pharmacologic comfort measures Outcome: Progressing   Problem: Health Behavior/Discharge Planning: Goal: Ability to manage health-related needs will improve Outcome: Progressing

## 2023-11-09 NOTE — Progress Notes (Signed)
Physical Therapy Treatment Patient Details Name: Jonathan Luna MRN: [id number] DOB: 1966-02-24 Today's Date: 11/09/2023   History of Present Illness Pt is a 58 y/o male admitted 10/29/23  with worsening weakness and gait instability from neurology clinic. 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 To have plasma exchange therapy. Dialysis catheter RIJ placed.  PMHx  HTN, HLD, chronic pain, neuropathy, gait abnormality, CIDP    PT Comments  Pt resting in bed on arrival and agreeable to session. Pt with increased c/o fatigue throughout session and increased tremoring noted with pt needing x2 standing rest breaks during gait and increased assist to maintain balance. Physically pt requiring CGA to power up to standing and min A during gait to maintain balance during due to increased tremoring and LE fatigue and impaired coordination. Pt continues to require cues for RW proximity and upright trunk with pt able to self correct a few times without prompts. Pt performing serial sit<>stands at end of session for focus on controlled descent to sitting. Current plan remains appropriate to address deficits and maximize functional independence and decrease caregiver burden. Pt continues to benefit from skilled PT services to progress toward functional mobility goals.     If plan is discharge home, recommend the following: A little help with walking and/or transfers;Assistance with cooking/housework;Assist for transportation;Help with stairs or ramp for entrance   Can travel by private vehicle        Equipment Recommendations  None recommended by PT    Recommendations for Other Services       Precautions / Restrictions Precautions Precautions: Fall Restrictions Weight Bearing Restrictions Per Provider Order: No     Mobility  Bed Mobility Overal bed mobility: Modified Independent             General bed mobility comments: incr time/effort supine to sit to supine  wth HOB flat    Transfers Overall transfer level: Needs assistance Equipment used: Rolling walker (2 wheels) Transfers: Sit to/from Stand Sit to Stand: Contact guard assist           General transfer comment: CGA for safety, from EOB, able to complete x5 at end of session with focus on eccentric control to sitting with cues for increased trunk flexion    Ambulation/Gait Ambulation/Gait assistance: Min assist Gait Distance (Feet): 168 Feet Assistive device: Rolling walker (2 wheels) Gait Pattern/deviations: Step-through pattern, Decreased stride length, Decreased dorsiflexion - right, Decreased dorsiflexion - left, Knee hyperextension - right, Knee hyperextension - left, Steppage Gait velocity: decr     General Gait Details: increased UE tremoring throughout session, pt needing x2 standing rest breaks as pt with increased fatigue, min A to steady and maintain balance   Stairs             Wheelchair Mobility     Tilt Bed    Modified Rankin (Stroke Patients Only)       Balance Overall balance assessment: Needs assistance Sitting-balance support: No upper extremity supported, Feet unsupported Sitting balance-Leahy Scale: Good Sitting balance - Comments: sits unsupported EOB   Standing balance support: During functional activity, Reliant on assistive device for balance Standing balance-Leahy Scale: Poor Standing balance comment: reliant on UE support during dynamic tasks                            Cognition Arousal: Alert Behavior During Therapy: WFL for tasks assessed/performed Overall Cognitive Status: Within Functional Limits for tasks  assessed                                 General Comments: vebalizing more awareness of understanding about overexertion from previous session/education from other therapists        Exercises General Exercises - Lower Extremity Hip Flexion/Marching: Strengthening, Both, 10 reps, Seated (with 2#  weight on knee) Other Exercises Other Exercises: serial sit<>stands x5 with focus on eccentric control to sitting reaching back to sittiing surface with single UE and increasing trunk flexion on descent    General Comments        Pertinent Vitals/Pain Pain Assessment Pain Assessment: No/denies pain Pain Intervention(s): Monitored during session    Home Living                          Prior Function            PT Goals (current goals can now be found in the care plan section) Acute Rehab PT Goals Patient Stated Goal: regain the strength he has lost PT Goal Formulation: With patient Time For Goal Achievement: 11/15/23 Progress towards PT goals: Progressing toward goals    Frequency    Min 1X/week      PT Plan      Co-evaluation              AM-PAC PT "6 Clicks" Mobility   Outcome Measure  Help needed turning from your back to your side while in a flat bed without using bedrails?: None Help needed moving from lying on your back to sitting on the side of a flat bed without using bedrails?: None Help needed moving to and from a bed to a chair (including a wheelchair)?: A Little Help needed standing up from a chair using your arms (e.g., wheelchair or bedside chair)?: A Little Help needed to walk in hospital room?: A Little Help needed climbing 3-5 steps with a railing? : Total 6 Click Score: 18    End of Session Equipment Utilized During Treatment: Gait belt Activity Tolerance: Patient limited by fatigue;Patient tolerated treatment well Patient left: with call bell/phone within reach;in bed Nurse Communication: Mobility status PT Visit Diagnosis: Other abnormalities of gait and mobility (R26.89);Muscle weakness (generalized) (M62.81);History of falling (Z91.81)     Time: 8295-6213 PT Time Calculation (min) (ACUTE ONLY): 24 min  Charges:    $Gait Training: 8-22 mins $Therapeutic Exercise: 8-22 mins PT General Charges $$ ACUTE PT VISIT: 1  Visit                     Jonathan Luna R. PTA Acute Rehabilitation Services Office: [phone]   Jonathan Luna 11/09/2023, 11:19 AM

## 2023-11-09 NOTE — Progress Notes (Signed)
Inpatient Rehab Admissions Coordinator:   I received insurance approval.  Will follow for admit pending bed availability, hopeful in the next 24-48 hours.   Estill Dooms, PT, DPT Admissions Coordinator [phone] 11/09/23  3:47 PM

## 2023-11-09 NOTE — Progress Notes (Signed)
Occupational Therapy Treatment Patient Details Name: Jonathan Luna MRN: [id number] DOB: December 18, 1965 Today's Date: 11/09/2023   History of present illness Pt is a 58 y/o male admitted 10/29/23  with worsening weakness and gait instability from neurology clinic. 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 To have plasma exchange therapy. Dialysis catheter RIJ placed.  PMHx  HTN, HLD, chronic pain, neuropathy, gait abnormality, CIDP   OT comments  Pt progressing toward goals this session, session focused on functional seated grooming tasks and BUE therex. Pt needing min A for oral care, at times using BUE to grasp toothbrush, assist for screwing/unscrewing caps. Pt completing putty exercises with BUE wrist support cuffs donned. Pt presenting with impairments listed below, will follow acutely. Patient will benefit from intensive inpatient follow up therapy, >3 hours/day to maximize safety/ind with ADL/functional mobility.       If plan is discharge home, recommend the following:  A little help with walking and/or transfers;A lot of help with bathing/dressing/bathroom;Assistance with cooking/housework;Assist for transportation;Help with stairs or ramp for entrance   Equipment Recommendations  BSC/3in1;Other (comment)    Recommendations for Other Services PT consult;Rehab consult    Precautions / Restrictions Precautions Precautions: Fall Restrictions Weight Bearing Restrictions Per Provider Order: No       Mobility Bed Mobility Overal bed mobility: Modified Independent                  Transfers Overall transfer level: Needs assistance Equipment used: Rolling walker (2 wheels) Transfers: Sit to/from Stand Sit to Stand: Contact guard assist                 Balance Overall balance assessment: Needs assistance Sitting-balance support: No upper extremity supported, Feet unsupported Sitting balance-Leahy Scale: Good Sitting balance -  Comments: sits unsupported EOB   Standing balance support: During functional activity, Reliant on assistive device for balance Standing balance-Leahy Scale: Poor Standing balance comment: reliant on UE support during dynamic tasks                           ADL either performed or assessed with clinical judgement   ADL Overall ADL's : Needs assistance/impaired     Grooming: Minimal assistance;Oral care;Sitting Grooming Details (indicate cue type and reason): assist for unscrewing small caps             Lower Body Dressing: Moderate assistance Lower Body Dressing Details (indicate cue type and reason): able to doff sock in supine with incr time, max A to don Toilet Transfer: Contact guard assist;Ambulation;Rolling walker (2 wheels) Toilet Transfer Details (indicate cue type and reason): simulated from chair at sink to EOB         Functional mobility during ADLs: Contact guard assist;Rolling walker (2 wheels)      Extremity/Trunk Assessment Upper Extremity Assessment Upper Extremity Assessment: RUE deficits/detail;LUE deficits/detail RUE Deficits / Details: stronger proximally vs distally, 3+/5 shoulder/elbow ROM, 2/5 grasp, minimal wrist extension noted, tremoring noted, can abd/add digits, can extend thumb with hand pronated, can oppose all digits except to pinky RUE Coordination: decreased fine motor;decreased gross motor LUE Deficits / Details: stronger proximally vs distally, 3+/5 shoulder/elbow ROM, 2/5 grasp, minimal wrist extension noted, tremoring noted, can oppose all digits, decr digit extension LUE Coordination: decreased fine motor;decreased gross motor   Lower Extremity Assessment Lower Extremity Assessment: Defer to PT evaluation        Vision   Vision Assessment?: No apparent  visual deficits   Perception Perception Perception: Not tested   Praxis Praxis Praxis: Not tested    Cognition Arousal: Alert Behavior During Therapy: WFL for tasks  assessed/performed Overall Cognitive Status: Within Functional Limits for tasks assessed                                          Exercises Other Exercises Other Exercises: rolling putty into snake BUE x5 Other Exercises: pressing pointer finger into putty BUE x5 Other Exercises: cylindrical grasp with putty x5    Shoulder Instructions       General Comments pt taking x3 rest breaks during session, improving awareness of activity pacing and need for rest in between activities    Pertinent Vitals/ Pain       Pain Assessment Pain Assessment: No/denies pain Pain Intervention(s): Monitored during session  Home Living                                          Prior Functioning/Environment              Frequency  Min 1X/week        Progress Toward Goals  OT Goals(current goals can now be found in the care plan section)  Progress towards OT goals: Progressing toward goals  Acute Rehab OT Goals Patient Stated Goal: improve UE function OT Goal Formulation: With patient Time For Goal Achievement: 11/15/23 Potential to Achieve Goals: Good ADL Goals Pt Will Perform Upper Body Dressing: with supervision;sitting Pt Will Perform Lower Body Dressing: with supervision;sit to/from stand;sitting/lateral leans Pt Will Transfer to Toilet: with supervision;ambulating;regular height toilet Pt Will Perform Tub/Shower Transfer: with supervision;Tub transfer;Shower transfer;ambulating;tub bench Pt/caregiver will Perform Home Exercise Program: Both right and left upper extremity;With Supervision;With written HEP provided  Plan      Co-evaluation                 AM-PAC OT "6 Clicks" Daily Activity     Outcome Measure   Help from another person eating meals?: A Little Help from another person taking care of personal grooming?: A Little Help from another person toileting, which includes using toliet, bedpan, or urinal?: A Little Help from  another person bathing (including washing, rinsing, drying)?: A Lot Help from another person to put on and taking off regular upper body clothing?: A Lot Help from another person to put on and taking off regular lower body clothing?: A Lot 6 Click Score: 15    End of Session Equipment Utilized During Treatment: Rolling walker (2 wheels)  OT Visit Diagnosis: Unsteadiness on feet (R26.81);Other abnormalities of gait and mobility (R26.89);Muscle weakness (generalized) (M62.81)   Activity Tolerance Patient tolerated treatment well   Patient Left in bed;with call bell/phone within reach;with bed alarm set   Nurse Communication Mobility status        Time: 2355-7322 OT Time Calculation (min): 37 min  Charges: OT General Charges $OT Visit: 1 Visit OT Treatments $Self Care/Home Management : 8-22 mins $Therapeutic Activity: 8-22 mins  Carver Fila, OTD, OTR/L SecureChat Preferred Acute Rehab [phone]   Carver Fila Koonce 11/09/2023, 4:21 PM

## 2023-11-09 NOTE — Progress Notes (Signed)
Inpatient Rehab Admissions Coordinator:   Insurance auth for [employer] pending.  Will follow.   Estill Dooms, PT, DPT Admissions Coordinator [phone] 11/09/23  10:18 AM

## 2023-11-09 NOTE — Progress Notes (Signed)
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  Patient was in the hospital last month with similar symptoms and treated with IVIG.  Reported worsening symptoms since last 2 weeks.  Also with upper extremity numbness and weakness.  In the emergency room hemodynamically stable.  Admitted for plasma exchange.  Subjective:  Patient seen and examined.  Remained in the hospital to go to CIR.  Completed treatments.  Tells me that prednisone might have caused some muscle weakness and shakiness.   Assessment & Plan:   CIDP: Recurrent weakness and gait abnormality.  Admitted for plasma exchange.  Continue respiratory therapy evaluation, NIF and vital capacity.  All supportive care available.  Stable so far. IR placed temporary catheter right internal jugular.  Completed 5 sessions of plasma exchange with some clinical improvement.  Followed by neurology.  On high-dose steroids. Plan is to continue prednisone 60 mg daily until outpatient follow-up. Medically stable to go to CIR.  Electrolytes: Adequate.  Hypothyroidism: On thyroid supplementation.  Anxiety disorder: On duloxetine, sertraline and as needed Klonopin.  Neuropathy: On gabapentin and Trileptal continued.  Chronic pain syndrome: As above on Cymbalta gabapentin and as needed oxycodone.  Added gabapentin 900 mg in addition to his 3 times daily doses as as needed.  Nicotine use: Patient with significant nicotine use.  wants to use his nicotine pouches, will allow to use it.   DVT prophylaxis: enoxaparin (LOVENOX) injection 40 mg Start: 10/29/23 1800   Code Status: Full code Family Communication: None today. Disposition Plan: Status is: Inpatient Remains inpatient  appropriate because: Waiting for CIR bed.     Consultants:  Neurology  Procedures:  Plasma exchange  Antimicrobials:  None     Objective: Vitals:   11/08/23 2353 11/09/23 0459 11/09/23 0700 11/09/23 1205  BP: 120/76 (!) 135/90 (!) 147/91 (!) 143/87  Pulse: 66 73 81 72  Resp: 18 18 17    Temp: 97.9 F (36.6 C) 97.9 F (36.6 C) 98.8 F (37.1 C) 98.3 F (36.8 C)  TempSrc: Oral Oral Oral Oral  SpO2: 96% 100% 99% 97%  Weight:      Height:        Intake/Output Summary (Last 24 hours) at 11/09/2023 1315 Last data filed at 11/09/2023 0800 Gross per 24 hour  Intake 1080 ml  Output 2725 ml  Net -1645 ml    Filed Weights   11/06/23 1118 11/06/23 1257 11/07/23 0500  Weight: 83.9 kg 83.9 kg 81.6 kg    Examination:  General exam: Calm and comfortable. Pleasant and interactive. Respiratory system: No added sounds.  Bilateral clear. Cardiovascular system: S1 & S2 heard, RRR.  No pedal edema. Gastrointestinal system: Soft nontender. Central nervous system: Alert and oriented.  Pleasant interaction. Both upper extremity with profound weakness of the wrist extensor 2/5.  Proximal muscle groups are 4/5.  Lower extremity 4/5.    Data Reviewed: I have personally reviewed following labs and imaging studies  CBC: Recent Labs  Lab 11/04/23 0850 11/06/23 0510 11/07/23 0524 11/08/23 0659 11/09/23 0703  WBC 13.2* 16.8* 19.3* 16.1* 15.7*  NEUTROABS 6.8 7.9*  --   --   --   HGB 13.2 13.8 13.4 13.7 13.7  HCT 37.2* 40.8 39.4 40.7 40.7  MCV 89.0 90.7 91.0 90.0 91.1  PLT  275 308 306 332 341   Basic Metabolic Panel: Recent Labs  Lab 11/04/23 0850 11/06/23 0510 11/07/23 0524 11/08/23 0659 11/09/23 0703  NA 138 138 139 136 138  K 3.1* 4.2 3.9 3.9 4.2  CL 102 101 105 102 105  CO2 27 28 26 25 24   GLUCOSE 115* 80 80 83 77  BUN 11 9 11 10 12   CREATININE 0.88 0.69 0.96 0.94 0.74  CALCIUM 8.5* 9.2 8.8* 9.0 9.1  MG  --  2.3  --   --  2.2  PHOS  --   --   --   --  3.7    GFR: Estimated Creatinine Clearance: 104.2 mL/min (by C-G formula based on SCr of 0.74 mg/dL). Liver Function Tests: Recent Labs  Lab 11/06/23 0510  AST 20  ALT 34  ALKPHOS 21*  BILITOT 0.7  PROT 5.6*  ALBUMIN 4.2   No results for input(s): "LIPASE", "AMYLASE" in the last 168 hours. No results for input(s): "AMMONIA" in the last 168 hours. Coagulation Profile: No results for input(s): "INR", "PROTIME" in the last 168 hours. Cardiac Enzymes: No results for input(s): "CKTOTAL", "CKMB", "CKMBINDEX", "TROPONINI" in the last 168 hours. BNP (last 3 results) Recent Labs    09/04/23 1021  PROBNP 6.0   HbA1C: No results for input(s): "HGBA1C" in the last 72 hours. CBG: No results for input(s): "GLUCAP" in the last 168 hours. Lipid Profile: No results for input(s): "CHOL", "HDL", "LDLCALC", "TRIG", "CHOLHDL", "LDLDIRECT" in the last 72 hours. Thyroid Function Tests: No results for input(s): "TSH", "T4TOTAL", "FREET4", "T3FREE", "THYROIDAB" in the last 72 hours. Anemia Panel: No results for input(s): "VITAMINB12", "FOLATE", "FERRITIN", "TIBC", "IRON", "RETICCTPCT" in the last 72 hours. Sepsis Labs: No results for input(s): "PROCALCITON", "LATICACIDVEN" in the last 168 hours.  No results found for this or any previous visit (from the past 240 hours).        Radiology Studies: No results found.       Scheduled Meds:  aspirin EC  81 mg Oral Daily   dextromethorphan-guaiFENesin  1 tablet Oral BID   DULoxetine  60 mg Oral Daily   enoxaparin (LOVENOX) injection  40 mg Subcutaneous Q24H   gabapentin  900 mg Oral TID   heparin sodium (porcine)  1,000 Units Intracatheter Once   irbesartan  300 mg Oral Daily   levothyroxine  125 mcg Oral Q0600   metoprolol succinate  25 mg Oral Daily   predniSONE  60 mg Oral Q breakfast   sertraline  25 mg Oral Daily   Continuous Infusions:  citrate dextrose       LOS: 11 days    Time spent: 30 minutes    Dorcas Carrow,  MD [facility]

## 2023-11-09 NOTE — Plan of Care (Signed)

## 2023-11-09 NOTE — PMR Pre-admission (Signed)
PMR Admission Coordinator Pre-Admission Assessment  Patient: Jonathan Luna is an 58 y.o., male MRN: [id number] DOB: Oct 20, 1965 Height: 5\' 7"  (170.2 cm) Weight: 81.6 kg              Insurance Information HMO:     PPO:      PCP:      IPA:      80/20:      OTHER:  PRIMARY: [employer]      Policy#: [id number]      Subscriber: pt CM Name: Tish      Phone#: not provided     Fax#: [phone] Pre-Cert#: [id number] auth from [facility] with updates due on 2/3 to [place] at fax listed above (ext [phone])      Employer:  Benefits:  Phone #: [phone]     Name:  Eff. Date: 12/12/22     Deduct: $3500 (met)      Out of Pocket Max: $7000 (met)      Life Max:   CIR: 70%      SNF: 70% Outpatient:      Co-Pay: $30/visit Home Health: 70%      Co-Pay: 30% DME: 70%     Co-Pay: 30% Providers:  SECONDARY:       Policy#:       Phone#:   Artist:       Phone#:   The Engineer, materials Information Summary" for patients in Inpatient Rehabilitation Facilities with attached "Privacy Act Statement-Health Care Records" was provided and verbally reviewed with: Patient and Family  Emergency Contact Information Contact Information     Name Relation Home Work Mobile   Davidovich,Karen Spouse [phone]  [phone]   Nihar, Klus   [phone]      Other Contacts   None on File    Current Medical History  Patient Admitting Diagnosis: CIDP  History of Present Illness: Jonathan Luna is a 58 y.o. male with PMHx of  has a past medical history of Allergy, Anxiety, Arthritis, Cataract, Community acquired pneumonia (08/28/2023), Depression, Detached retina, Heart murmur, History of bilateral cataract extraction, Hyperlipidemia, Hypothyroidism, Low back pain, Panic attack, Panic attacks, and Pneumonia of right upper lobe due to infectious organism (08/18/2023). . They were admitted to [facility] on 10/29/2023 for worsening weakness and paresthesias, evaluated in neurology clinic by Dr. Terrace Arabia 1-16, and sent to the ER for  plasma exchange for acute exacerbation CIDP.  This patient is well-known to the PM&R service due to recent inpatient rehab stay 12-23 to 10-20-2023 for the same.  He has undergone plasmapheresis 1-17, 18, and 20, with next planned today.  He remains on high-dose steroids, with NIFs -40 vital capacity appropriate.  This hospitalization has been complicated by hyponatremia, hypokalemia, hypertension, hypothyroidism, anxiety, and chronic pain with worsening neuropathy.  Therapy evaluations completed and pt was recommended for CIR.    Glasgow Coma Scale Score: 15  Patient's medical record from [facility] has been reviewed by the rehabilitation admission coordinator and physician.  Past Medical History  Past Medical History:  Diagnosis Date   Allergy    Anxiety    Arthritis    Cataract    Community acquired pneumonia 08/28/2023   Depression    Detached retina    Heart murmur    History of bilateral cataract extraction    Hyperlipidemia    Hypothyroidism    Low back pain    Panic attack    Panic attacks    Pneumonia of right upper lobe due to  infectious organism 08/18/2023    Has the patient had major surgery during 100 days prior to admission? No  Family History  family history includes Asthma in his father; Breast cancer in his mother; Diabetes in his brother; Emphysema in his father; Heart disease in his father; Hyperlipidemia in his brother; Hypertension in his father.   Current Medications   Current Facility-Administered Medications:    acetaminophen (TYLENOL) tablet 650 mg, 650 mg, Oral, Q6H PRN, 650 mg at 11/04/23 2056 **OR** acetaminophen (TYLENOL) suppository 650 mg, 650 mg, Rectal, Q6H PRN, Synetta Fail, MD   acetaminophen (TYLENOL) tablet 650 mg, 650 mg, Oral, Q4H PRN, Rejeana Brock, MD, 650 mg at 11/06/23 0939   albuterol (VENTOLIN HFA) 108 (90 Base) MCG/ACT inhaler 2 puff, 2 puff, Inhalation, Q4H PRN, Dorcas Carrow, MD, 2 puff at 11/06/23 0816   aspirin EC  tablet 81 mg, 81 mg, Oral, Daily, Dorcas Carrow, MD, 81 mg at 11/09/23 8756   calcium carbonate (TUMS - dosed in mg elemental calcium) chewable tablet 200 mg of elemental calcium, 1 tablet, Oral, BID PRN, Howerter, Justin B, DO, 200 mg of elemental calcium at 11/05/23 2003   citrate dextrose (ACD-A anticoagulant) solution 1,000 mL, 1,000 mL, Other, Continuous, Rejeana Brock, MD, 1,000 mL at 11/06/23 1113   clonazePAM (KLONOPIN) tablet 0.5 mg, 0.5 mg, Oral, BID PRN, Synetta Fail, MD, 0.5 mg at 11/08/23 2059   dextromethorphan-guaiFENesin (MUCINEX DM) 30-600 MG per 12 hr tablet 1 tablet, 1 tablet, Oral, BID, Dorcas Carrow, MD, 1 tablet at 11/09/23 0824   diphenhydrAMINE (BENADRYL) capsule 25 mg, 25 mg, Oral, Q6H PRN, Rejeana Brock, MD, 25 mg at 11/08/23 2059   DULoxetine (CYMBALTA) DR capsule 60 mg, 60 mg, Oral, Daily, Synetta Fail, MD, 60 mg at 11/09/23 0824   enoxaparin (LOVENOX) injection 40 mg, 40 mg, Subcutaneous, Q24H, Synetta Fail, MD, 40 mg at 11/08/23 1840   gabapentin (NEURONTIN) capsule 900 mg, 900 mg, Oral, TID, Synetta Fail, MD, 900 mg at 11/09/23 0825   gabapentin (NEURONTIN) capsule 900 mg, 900 mg, Oral, Daily PRN, Dorcas Carrow, MD, 900 mg at 11/05/23 1900   guaiFENesin-dextromethorphan (ROBITUSSIN DM) 100-10 MG/5ML syrup 5 mL, 5 mL, Oral, Q4H PRN, Synetta Fail, MD, 5 mL at 10/30/23 4332   heparin sodium (porcine) injection 1,000 Units, 1,000 Units, Intracatheter, Once, Rejeana Brock, MD   irbesartan (AVAPRO) tablet 300 mg, 300 mg, Oral, Daily, Synetta Fail, MD, 300 mg at 11/09/23 9518   levothyroxine (SYNTHROID) tablet 125 mcg, 125 mcg, Oral, Q0600, Synetta Fail, MD, 125 mcg at 11/09/23 0419   metoprolol succinate (TOPROL-XL) 24 hr tablet 25 mg, 25 mg, Oral, Daily, Synetta Fail, MD, 25 mg at 11/09/23 8416   nicotine polacrilex (NICORETTE) gum 2 mg, 2 mg, Oral, PRN, John Giovanni, MD, 2 mg at  10/30/23 0544   Oral care mouth rinse, 15 mL, Mouth Rinse, PRN, Synetta Fail, MD   oxyCODONE-acetaminophen (PERCOCET/ROXICET) 5-325 MG per tablet 1 tablet, 1 tablet, Oral, Q4H PRN, Dorcas Carrow, MD, 1 tablet at 11/09/23 1340   polyethylene glycol (MIRALAX / GLYCOLAX) packet 17 g, 17 g, Oral, Daily PRN, Synetta Fail, MD   predniSONE (DELTASONE) tablet 60 mg, 60 mg, Oral, Q breakfast, Richardo Priest, Erin C, NP, 60 mg at 11/09/23 6063   sertraline (ZOLOFT) tablet 25 mg, 25 mg, Oral, Daily, Synetta Fail, MD, 25 mg at 11/09/23 0160  Patients Current Diet:  Diet Order  Diet regular Room service appropriate? Yes; Fluid consistency: Thin  Diet effective now                   Precautions / Restrictions Precautions Precautions: Fall Restrictions Weight Bearing Restrictions Per Provider Order: No   Has the patient had 2 or more falls or a fall with injury in the past year?No  Prior Activity Level Limited Community (1-2x/wk): mod I since d/c from CIR earlier this month, using rollator  Prior Functional Level Prior Function Prior Level of Function : Independent/Modified Independent, History of Falls (last six months) Mobility Comments: uses rollator ADLs Comments: was ind up until this admission, spouse has assist for ADLs  Self Care: Did the patient need help bathing, dressing, using the toilet or eating?  Independent  Indoor Mobility: Did the patient need assistance with walking from room to room (with or without device)? Independent  Stairs: Did the patient need assistance with internal or external stairs (with or without device)? Independent  Functional Cognition: Did the patient need help planning regular tasks such as shopping or remembering to take medications? Independent  Patient Information Are you of Hispanic, Latino/a,or Spanish origin?: A. No, not of Hispanic, Latino/a, or Spanish origin What is your race?: A. White Do you need or want an  interpreter to communicate with a doctor or health care staff?: 0. No  Patient's Response To:  Health Literacy and Transportation Is the patient able to respond to health literacy and transportation needs?: Yes Health Literacy - How often do you need to have someone help you when you read instructions, pamphlets, or other written material from your doctor or pharmacy?: Never In the past 12 months, has lack of transportation kept you from medical appointments or from getting medications?: No In the past 12 months, has lack of transportation kept you from meetings, work, or from getting things needed for daily living?: No  Home Assistive Devices / Equipment Home Equipment: Tub bench, Rollator (4 wheels), Grab bars - tub/shower  Prior Device Use: Indicate devices/aids used by the patient prior to current illness, exacerbation or injury?  rollator  Current Functional Level Cognition  Overall Cognitive Status: Within Functional Limits for tasks assessed Orientation Level: Oriented X4 General Comments: vebalizing more awareness of understanding about overexertion from previous session/education from other therapists    Extremity Assessment (includes Sensation/Coordination)  Upper Extremity Assessment: RUE deficits/detail, Right hand dominant RUE Deficits / Details: stronger proximally vs distally, 3+/5 shoulder/elbow ROM, 2/5 grasp, minimal wrist extension noted, tremoring noted, can abd/add digits, can extend thumb with hand pronated, can oppose all digits except to pinky RUE Coordination: decreased fine motor, decreased gross motor LUE Deficits / Details: stronger proximally vs distally, 3+/5 shoulder/elbow ROM, 2/5 grasp, minimal wrist extension noted, tremoring noted, can oppose all digits, decr digit extension LUE Coordination: decreased fine motor, decreased gross motor  Lower Extremity Assessment: Defer to PT evaluation RLE Deficits / Details: knee extension 3-, ankle DF1+ LLE Deficits  / Details: knee extension 3-, ankle DF1+    ADLs  Overall ADL's : Needs assistance/impaired Eating/Feeding: Set up, With adaptive utensils, With assist to don/doff brace/orthosis Eating/Feeding Details (indicate cue type and reason): weighted fork Grooming: Minimal assistance, Oral care, Wash/dry face Grooming Details (indicate cue type and reason): using wrist support cuff to brush teeth with RUE Upper Body Bathing: Moderate assistance, Sitting Lower Body Bathing: Sitting/lateral leans, Maximal assistance Lower Body Bathing Details (indicate cue type and reason): supported afo/shoe and had pt. practice management of  velcro required max a, attempted shoe laces pt. able to do the initial crossing of laces but unable to hold and make the bow portions.  reviewed importance of cont. work with "pinch" exercises with theraputty and foam as precurser to gain strength for this portion of managing shoes/afos Upper Body Dressing : Moderate assistance, Sitting Lower Body Dressing: Moderate assistance, Sitting/lateral leans Toilet Transfer: Minimal assistance Toileting- Clothing Manipulation and Hygiene: Minimal assistance Functional mobility during ADLs: Minimal assistance, Rolling walker (2 wheels) General ADL Comments: session focused on UE exercises and seated ADL    Mobility  Overal bed mobility: Modified Independent General bed mobility comments: incr time/effort supine to sit to supine wth HOB flat    Transfers  Overall transfer level: Needs assistance Equipment used: Rolling walker (2 wheels) Transfers: Sit to/from Stand Sit to Stand: Contact guard assist General transfer comment: CGA for safety, from EOB, able to complete x5 at end of session with focus on eccentric control to sitting with cues for increased trunk flexion    Ambulation / Gait / Stairs / Wheelchair Mobility  Ambulation/Gait Ambulation/Gait assistance: Editor, commissioning (Feet): 168 Feet Assistive device: Rolling  walker (2 wheels) Gait Pattern/deviations: Step-through pattern, Decreased stride length, Decreased dorsiflexion - right, Decreased dorsiflexion - left, Knee hyperextension - right, Knee hyperextension - left, Steppage General Gait Details: increased UE tremoring throughout session, pt needing x2 standing rest breaks as pt with increased fatigue, min A to steady and maintain balance Gait velocity: decr Gait velocity interpretation: 1.31 - 2.62 ft/sec, indicative of limited community ambulator    Posture / Balance Dynamic Sitting Balance Sitting balance - Comments: sits unsupported EOB Balance Overall balance assessment: Needs assistance Sitting-balance support: No upper extremity supported, Feet unsupported Sitting balance-Leahy Scale: Good Sitting balance - Comments: sits unsupported EOB Standing balance support: During functional activity, Reliant on assistive device for balance Standing balance-Leahy Scale: Poor Standing balance comment: reliant on UE support during dynamic tasks    Special needs/care consideration N/a     Previous Home Environment (from acute therapy documentation) Living Arrangements: Spouse/significant other Available Help at Discharge: Family, Available PRN/intermittently Type of Home: House Home Layout: One level Home Access: Stairs to enter Entrance Stairs-Rails: None Entrance Stairs-Number of Steps: 1 Bathroom Shower/Tub: Associate Professor: No How Accessible:  (rollator does not fit in bathroom) Home Care Services: No  Discharge Living Setting Plans for Discharge Living Setting: Patient's home, Lives with (comment) (spouse) Type of Home at Discharge: House Discharge Home Layout: One level Discharge Home Access: Stairs to enter Entrance Stairs-Rails: None Entrance Stairs-Number of Steps: 1 Discharge Bathroom Shower/Tub: Tub/shower unit Discharge Bathroom Toilet: Standard Discharge Bathroom  Accessibility: No Does the patient have any problems obtaining your medications?: No  Social/Family/Support Systems Patient Roles: Spouse Anticipated Caregiver: wife, Clydie Braun Anticipated Caregiver's Contact Information: [phone] Ability/Limitations of Caregiver: wife is legally blind Caregiver Availability: 24/7 Discharge Plan Discussed with Primary Caregiver: Yes Is Caregiver In Agreement with Plan?: Yes Does Caregiver/Family have Issues with Lodging/Transportation while Pt is in Rehab?: No   Goals Patient/Family Goal for Rehab: PT/OT mod I, SLP n/a Expected length of stay: 8-12 days Additional Information: Discharge plan: home with spouse for 24/7 supervision, but excpect mod I level Pt/Family Agrees to Admission and willing to participate: Yes Program Orientation Provided & Reviewed with Pt/Caregiver Including Roles  & Responsibilities: Yes   Decrease burden of Care through IP rehab admission: no   Possible need for SNF placement upon discharge: No.  Plan for d/c home with spouse.  She is legally blind, can provide some support if needed but expect mod I level at discharge.    Patient Condition: This patient's medical and functional status has changed since the consult dated: 11/06/23 in which the Rehabilitation Physician determined and documented that the patient's condition is appropriate for intensive rehabilitative care in an inpatient rehabilitation facility. See "History of Present Illness" (above) for medical update. Functional changes are: pt min assist with mobility/adls. Patient's medical and functional status update has been discussed with the Rehabilitation physician and patient remains appropriate for inpatient rehabilitation. Will admit to inpatient rehab today.  Preadmission Screen Completed By:  Stephania Fragmin, PT, DPT 11/09/2023 3:54 PM ______________________________________________________________________   Discussed status with Dr. Berline Chough on 11/10/23 at 9:51 AM   and received approval for admission today.  Admission Coordinator:  Stephania Fragmin, PT, DPT time 9:51 AM Dorna Bloom 11/10/23

## 2023-11-09 NOTE — TOC Progression Note (Signed)
Transition of Care [facility]) - Progression Note    Patient Details  Name: Jonathan Luna MRN: [id number] Date of Birth: 12-31-1965  [facility] CM/SW Contact  Kermit Balo, RN Phone Number: 11/09/2023, 2:24 PM  Clinical Narrative:     Insurance pending for [employer]. TOC following.  Expected Discharge Plan: IP Rehab Facility Barriers to Discharge: Continued Medical Work up  Expected Discharge Plan and Services       Living arrangements for the past 2 months: Single Family Home                                       Social Determinants of Health (SDOH) Interventions SDOH Screenings   Food Insecurity: No Food Insecurity (10/29/2023)  Housing: Low Risk  (10/29/2023)  Transportation Needs: No Transportation Needs (10/29/2023)  Utilities: Not At Risk (10/29/2023)  Depression (PHQ2-9): Low Risk  (09/28/2023)  Financial Resource Strain: Patient Declined (05/27/2023)  Physical Activity: Insufficiently Active (05/27/2023)  Social Connections: Unknown (05/27/2023)  Stress: Stress Concern Present (05/27/2023)  Tobacco Use: Low Risk  (10/29/2023)    Readmission Risk Interventions     No data to display

## 2023-11-09 NOTE — Progress Notes (Signed)
11/08/23 @ 1930 NIF:-40 with great effort and proper technique

## 2023-11-10 ENCOUNTER — Other Ambulatory Visit: Payer: Self-pay

## 2023-11-10 ENCOUNTER — Encounter (HOSPITAL_COMMUNITY): Payer: Self-pay | Admitting: Physical Medicine and Rehabilitation

## 2023-11-10 ENCOUNTER — Ambulatory Visit: Payer: 59 | Admitting: Occupational Therapy

## 2023-11-10 ENCOUNTER — Ambulatory Visit: Payer: 59

## 2023-11-10 ENCOUNTER — Inpatient Hospital Stay (HOSPITAL_COMMUNITY)
Admission: AD | Admit: 2023-11-10 | Discharge: 2023-12-08 | DRG: 074 | Disposition: A | Discharge status: Home Health | Payer: 59 | Source: Intra-hospital | Attending: Physical Medicine and Rehabilitation | Admitting: Physical Medicine and Rehabilitation

## 2023-11-10 DIAGNOSIS — E039 Hypothyroidism, unspecified: Secondary | ICD-10-CM | POA: Diagnosis present

## 2023-11-10 DIAGNOSIS — G6181 Chronic inflammatory demyelinating polyneuritis: Principal | ICD-10-CM | POA: Diagnosis present

## 2023-11-10 DIAGNOSIS — T380X5A Adverse effect of glucocorticoids and synthetic analogues, initial encounter: Secondary | ICD-10-CM | POA: Diagnosis present

## 2023-11-10 DIAGNOSIS — E785 Hyperlipidemia, unspecified: Secondary | ICD-10-CM | POA: Diagnosis present

## 2023-11-10 DIAGNOSIS — K5900 Constipation, unspecified: Secondary | ICD-10-CM | POA: Diagnosis not present

## 2023-11-10 DIAGNOSIS — Z79890 Hormone replacement therapy (postmenopausal): Secondary | ICD-10-CM

## 2023-11-10 DIAGNOSIS — R7401 Elevation of levels of liver transaminase levels: Secondary | ICD-10-CM | POA: Diagnosis present

## 2023-11-10 DIAGNOSIS — R251 Tremor, unspecified: Secondary | ICD-10-CM | POA: Diagnosis present

## 2023-11-10 DIAGNOSIS — Z7952 Long term (current) use of systemic steroids: Secondary | ICD-10-CM

## 2023-11-10 DIAGNOSIS — G8250 Quadriplegia, unspecified: Secondary | ICD-10-CM | POA: Diagnosis not present

## 2023-11-10 DIAGNOSIS — Z825 Family history of asthma and other chronic lower respiratory diseases: Secondary | ICD-10-CM | POA: Diagnosis not present

## 2023-11-10 DIAGNOSIS — Z7982 Long term (current) use of aspirin: Secondary | ICD-10-CM | POA: Diagnosis not present

## 2023-11-10 DIAGNOSIS — I10 Essential (primary) hypertension: Secondary | ICD-10-CM | POA: Diagnosis present

## 2023-11-10 DIAGNOSIS — F419 Anxiety disorder, unspecified: Secondary | ICD-10-CM | POA: Diagnosis not present

## 2023-11-10 DIAGNOSIS — Z8249 Family history of ischemic heart disease and other diseases of the circulatory system: Secondary | ICD-10-CM

## 2023-11-10 DIAGNOSIS — Z880 Allergy status to penicillin: Secondary | ICD-10-CM

## 2023-11-10 DIAGNOSIS — Z888 Allergy status to other drugs, medicaments and biological substances status: Secondary | ICD-10-CM | POA: Diagnosis not present

## 2023-11-10 DIAGNOSIS — Z83438 Family history of other disorder of lipoprotein metabolism and other lipidemia: Secondary | ICD-10-CM

## 2023-11-10 DIAGNOSIS — F410 Panic disorder [episodic paroxysmal anxiety] without agoraphobia: Secondary | ICD-10-CM | POA: Diagnosis present

## 2023-11-10 DIAGNOSIS — G8929 Other chronic pain: Secondary | ICD-10-CM | POA: Diagnosis present

## 2023-11-10 DIAGNOSIS — G610 Guillain-Barre syndrome: Secondary | ICD-10-CM | POA: Diagnosis present

## 2023-11-10 DIAGNOSIS — F329 Major depressive disorder, single episode, unspecified: Secondary | ICD-10-CM | POA: Diagnosis present

## 2023-11-10 DIAGNOSIS — Z79899 Other long term (current) drug therapy: Secondary | ICD-10-CM

## 2023-11-10 DIAGNOSIS — Z833 Family history of diabetes mellitus: Secondary | ICD-10-CM

## 2023-11-10 DIAGNOSIS — R200 Anesthesia of skin: Secondary | ICD-10-CM | POA: Diagnosis present

## 2023-11-10 DIAGNOSIS — M159 Polyosteoarthritis, unspecified: Secondary | ICD-10-CM | POA: Diagnosis present

## 2023-11-10 DIAGNOSIS — R000 Tachycardia, unspecified: Secondary | ICD-10-CM

## 2023-11-10 DIAGNOSIS — D72829 Elevated white blood cell count, unspecified: Secondary | ICD-10-CM | POA: Diagnosis present

## 2023-11-10 DIAGNOSIS — F54 Psychological and behavioral factors associated with disorders or diseases classified elsewhere: Secondary | ICD-10-CM | POA: Diagnosis not present

## 2023-11-10 DIAGNOSIS — Z8701 Personal history of pneumonia (recurrent): Secondary | ICD-10-CM

## 2023-11-10 DIAGNOSIS — M545 Low back pain, unspecified: Secondary | ICD-10-CM | POA: Diagnosis present

## 2023-11-10 DIAGNOSIS — R209 Unspecified disturbances of skin sensation: Secondary | ICD-10-CM | POA: Diagnosis not present

## 2023-11-10 DIAGNOSIS — R011 Cardiac murmur, unspecified: Secondary | ICD-10-CM | POA: Diagnosis present

## 2023-11-10 DIAGNOSIS — R531 Weakness: Secondary | ICD-10-CM | POA: Diagnosis present

## 2023-11-10 DIAGNOSIS — F411 Generalized anxiety disorder: Secondary | ICD-10-CM

## 2023-11-10 DIAGNOSIS — Z803 Family history of malignant neoplasm of breast: Secondary | ICD-10-CM | POA: Diagnosis not present

## 2023-11-10 MED ORDER — METOPROLOL SUCCINATE ER 25 MG PO TB24
25.0000 mg | ORAL_TABLET | Freq: Every day | ORAL | Status: DC
  Administered 2023-11-11 – 2023-12-08 (×27): 25 mg via ORAL
  Filled 2023-11-10 (×28): qty 1

## 2023-11-10 MED ORDER — CLONAZEPAM 0.5 MG PO TABS
0.5000 mg | ORAL_TABLET | Freq: Two times a day (BID) | ORAL | Status: DC | PRN
  Administered 2023-11-10 – 2023-11-11 (×2): 0.5 mg via ORAL
  Filled 2023-11-10 (×2): qty 1

## 2023-11-10 MED ORDER — PREDNISONE 20 MG PO TABS
40.0000 mg | ORAL_TABLET | Freq: Every day | ORAL | Status: DC
  Administered 2023-11-11 – 2023-11-16 (×6): 40 mg via ORAL
  Filled 2023-11-10 (×6): qty 2

## 2023-11-10 MED ORDER — DULOXETINE HCL 60 MG PO CPEP
60.0000 mg | ORAL_CAPSULE | Freq: Every day | ORAL | Status: DC
  Administered 2023-11-11 – 2023-11-19 (×9): 60 mg via ORAL
  Filled 2023-11-10 (×9): qty 1

## 2023-11-10 MED ORDER — POLYETHYLENE GLYCOL 3350 17 G PO PACK: 17.0000 g | PACK | Freq: Every day | ORAL | Status: DC | PRN

## 2023-11-10 MED ORDER — PREDNISONE 20 MG PO TABS: 40.0000 mg | ORAL_TABLET | Freq: Every day | ORAL | Status: DC

## 2023-11-10 MED ORDER — ENOXAPARIN SODIUM 40 MG/0.4ML IJ SOSY
40.0000 mg | PREFILLED_SYRINGE | INTRAMUSCULAR | Status: DC
  Administered 2023-11-10 – 2023-12-07 (×28): 40 mg via SUBCUTANEOUS
  Filled 2023-11-10 (×29): qty 0.4

## 2023-11-10 MED ORDER — ENOXAPARIN SODIUM 40 MG/0.4ML IJ SOSY: 40.0000 mg | PREFILLED_SYRINGE | INTRAMUSCULAR | Status: DC

## 2023-11-10 MED ORDER — ONDANSETRON HCL 4 MG PO TABS
4.0000 mg | ORAL_TABLET | Freq: Four times a day (QID) | ORAL | Status: DC | PRN
  Administered 2023-11-19: 4 mg via ORAL
  Filled 2023-11-10: qty 1

## 2023-11-10 MED ORDER — CLONAZEPAM 1 MG PO TABS: 0.5000 mg | ORAL_TABLET | Freq: Two times a day (BID) | ORAL | Status: DC | PRN

## 2023-11-10 MED ORDER — GABAPENTIN 300 MG PO CAPS
900.0000 mg | ORAL_CAPSULE | Freq: Every day | ORAL | Status: DC | PRN
  Administered 2023-11-11 – 2023-12-06 (×6): 900 mg via ORAL
  Filled 2023-11-10 (×6): qty 3

## 2023-11-10 MED ORDER — DM-GUAIFENESIN ER 30-600 MG PO TB12
1.0000 | ORAL_TABLET | Freq: Two times a day (BID) | ORAL | Status: DC
  Administered 2023-11-10 – 2023-12-08 (×39): 1 via ORAL
  Filled 2023-11-10 (×15): qty 1
  Filled 2023-11-10: qty 2
  Filled 2023-11-10 (×35): qty 1

## 2023-11-10 MED ORDER — GUAIFENESIN-DM 100-10 MG/5ML PO SYRP: 5.0000 mL | ORAL_SOLUTION | ORAL | Status: DC | PRN

## 2023-11-10 MED ORDER — ACETAMINOPHEN 325 MG PO TABS
325.0000 mg | ORAL_TABLET | ORAL | Status: DC | PRN
  Administered 2023-11-14: 650 mg via ORAL
  Filled 2023-11-10: qty 2

## 2023-11-10 MED ORDER — IRBESARTAN 300 MG PO TABS
300.0000 mg | ORAL_TABLET | Freq: Every day | ORAL | Status: DC
  Administered 2023-11-11 – 2023-11-14 (×4): 300 mg via ORAL
  Filled 2023-11-10 (×4): qty 1

## 2023-11-10 MED ORDER — OXYCODONE-ACETAMINOPHEN 5-325 MG PO TABS
1.0000 | ORAL_TABLET | ORAL | Status: DC | PRN
  Administered 2023-11-10 – 2023-12-08 (×127): 1 via ORAL
  Filled 2023-11-10 (×136): qty 1

## 2023-11-10 MED ORDER — ALUM & MAG HYDROXIDE-SIMETH 200-200-20 MG/5ML PO SUSP
30.0000 mL | ORAL | Status: DC | PRN
  Administered 2023-11-18: 30 mL via ORAL
  Filled 2023-11-10: qty 30

## 2023-11-10 MED ORDER — FLEET ENEMA RE ENEM: 1.0000 | ENEMA | Freq: Once | RECTAL | Status: DC | PRN

## 2023-11-10 MED ORDER — BISACODYL 5 MG PO TBEC
5.0000 mg | DELAYED_RELEASE_TABLET | Freq: Every day | ORAL | Status: DC | PRN
  Filled 2023-11-10: qty 1

## 2023-11-10 MED ORDER — NICOTINE POLACRILEX 2 MG MT GUM: 2.0000 mg | CHEWING_GUM | OROMUCOSAL | Status: DC | PRN

## 2023-11-10 MED ORDER — ALBUTEROL SULFATE HFA 108 (90 BASE) MCG/ACT IN AERS: 2.0000 | INHALATION_SPRAY | RESPIRATORY_TRACT | Status: DC | PRN

## 2023-11-10 MED ORDER — GABAPENTIN 300 MG PO CAPS
900.0000 mg | ORAL_CAPSULE | Freq: Three times a day (TID) | ORAL | Status: DC
Start: 2023-11-10 — End: 2023-12-08
  Administered 2023-11-10 – 2023-12-08 (×84): 900 mg via ORAL
  Filled 2023-11-10 (×85): qty 3

## 2023-11-10 MED ORDER — DIPHENHYDRAMINE HCL 25 MG PO CAPS
25.0000 mg | ORAL_CAPSULE | Freq: Four times a day (QID) | ORAL | Status: DC | PRN
  Administered 2023-11-10 – 2023-12-01 (×19): 25 mg via ORAL
  Filled 2023-11-10 (×22): qty 1

## 2023-11-10 MED ORDER — LEVOTHYROXINE SODIUM 25 MCG PO TABS
125.0000 ug | ORAL_TABLET | Freq: Every day | ORAL | Status: DC
  Administered 2023-11-11 – 2023-12-08 (×28): 125 ug via ORAL
  Filled 2023-11-10 (×28): qty 1

## 2023-11-10 MED ORDER — ONDANSETRON HCL 4 MG/2ML IJ SOLN: 4.0000 mg | Freq: Four times a day (QID) | INTRAMUSCULAR | Status: DC | PRN

## 2023-11-10 MED ORDER — ASPIRIN 81 MG PO TBEC
81.0000 mg | DELAYED_RELEASE_TABLET | Freq: Every day | ORAL | Status: DC
  Administered 2023-11-11 – 2023-12-08 (×28): 81 mg via ORAL
  Filled 2023-11-10 (×28): qty 1

## 2023-11-10 MED ORDER — SERTRALINE HCL 50 MG PO TABS
25.0000 mg | ORAL_TABLET | Freq: Every day | ORAL | Status: DC
  Administered 2023-11-11 – 2023-11-18 (×8): 25 mg via ORAL
  Filled 2023-11-10 (×9): qty 1

## 2023-11-10 NOTE — Discharge Summary (Signed)
Physician Discharge Summary  Jonathan Luna QMV:[id number] DOB: November 02, 1965 DOA: 10/29/2023  PCP: [name], MD  Admit date: 10/29/2023 Discharge date: 11/10/2023  Admitted From: Home Disposition: Acute inpatient rehab  Recommendations for Outpatient Follow-up:  Follow up with PCP in 1-2 weeks Schedule neurology follow-up upon discharge from rehab.  Discharge Condition: Stable CODE STATUS: Full code Diet recommendation: Low-salt diet, nutritional supplements  Discharge summary: 58 year old with history of hypertension, hyperlipidemia, hypothyroidism and anxiety , neuropathy and CIDP presented with worsening weakness and gait instability from neurology clinic.  Patient was in the hospital last month with similar symptoms and treated with IVIG.  Reported worsening symptoms since last 2 weeks.  Also with upper extremity numbness and weakness.  In the emergency room hemodynamically stable.  Admitted for plasma exchange.  Patient remained in the hospital waiting for rehab bed availability.  Medically stabilized.  Still has significant weakness.  Treated for following conditions.  Assessment & Plan:   Acute exacerbation of CIDP: Recurrent weakness and gait abnormality.  Admitted for plasma exchange. Remained stable. Completed 5 sessions of plasma exchange with some clinical improvement.  Followed by neurology.  On high-dose steroids.  Patient will need aggressive rehab.  He is going to CIR today. Started on prednisone 60 mg daily, however patient continues to have tremors and sleeplessness and requested to decrease prednisone doses.  Will discharge with prednisone 40 mg daily and continue to monitor symptoms.  He will follow-up with neurology.   Electrolytes: Adequate.   Hypothyroidism: On thyroid supplementation.   Anxiety disorder: On duloxetine, sertraline and as needed Klonopin. Klonopin 0.5 mg twice daily at home, requested to increase doses to 1 mg twice daily and this was prescribed.    Neuropathy: On gabapentin.  Continued.   Chronic pain syndrome: As above on Cymbalta gabapentin and as needed oxycodone.  Symptoms are controlled.   Nicotine use: Patient with significant nicotine use.  He continued nicotine replacement.  Stable for transfer to CIR.  Discharge Diagnoses:  Principal Problem:   CIDP (chronic inflammatory demyelinating polyneuropathy) (HCC) Active Problems:   Hypothyroidism   Panic anxiety syndrome   Chronic pain disorder   Essential hypertension   Peripheral neuropathy    Discharge Instructions  Discharge Instructions     Diet - low sodium heart healthy   Complete by: As directed    Increase activity slowly   Complete by: As directed       Allergies as of 11/10/2023       Reactions   Amoxil [amoxicillin] Other (See Comments)   Pt states that it does not work for him when he had back to back pneumonia   Effexor [venlafaxine] Diarrhea   Prozac [fluoxetine Hcl] Diarrhea        Medication List     STOP taking these medications    Oxcarbazepine 300 MG tablet Commonly known as: Trileptal       TAKE these medications    albuterol 108 (90 Base) MCG/ACT inhaler Commonly known as: VENTOLIN HFA Inhale 1-2 puffs into the lungs every 6 (six) hours as needed for wheezing or shortness of breath.   aspirin EC 81 MG tablet Take 81 mg by mouth daily. Swallow whole.   clonazePAM 1 MG tablet Commonly known as: KLONOPIN Take 0.5 tablets (0.5 mg total) by mouth 2 (two) times daily as needed for anxiety. What changed: medication strength   DULoxetine 60 MG capsule Commonly known as: Cymbalta Take 1 capsule (60 mg total) by mouth daily.  fluticasone 50 MCG/ACT nasal spray Commonly known as: FLONASE Place 2 sprays into both nostrils daily.   gabapentin 300 MG capsule Commonly known as: NEURONTIN Take 3 capsules (900 mg total) by mouth 3 (three) times daily.   guaiFENesin-dextromethorphan 100-10 MG/5ML syrup Commonly known as:  ROBITUSSIN DM Take 5 mLs by mouth every 4 (four) hours as needed for cough.   irbesartan 300 MG tablet Commonly known as: AVAPRO Take 1 tablet (300 mg total) by mouth daily.   metoprolol succinate 25 MG 24 hr tablet Commonly known as: Toprol XL Take 1 tablet (25 mg total) by mouth daily.   oxyCODONE-acetaminophen 5-325 MG tablet Commonly known as: PERCOCET/ROXICET Take 1 tablet by mouth every 8 (eight) hours as needed for severe pain (pain score 7-10).   polyethylene glycol 17 g packet Commonly known as: MIRALAX / GLYCOLAX Take 17 g by mouth daily as needed for mild constipation.   predniSONE 20 MG tablet Commonly known as: DELTASONE Take 2 tablets (40 mg total) by mouth daily with breakfast. Start taking on: November 11, 2023 What changed:  medication strength how much to take   sertraline 25 MG tablet Commonly known as: ZOLOFT Take 1 tablet (25 mg total) by mouth daily.   tiZANidine 4 MG tablet Commonly known as: ZANAFLEX Take 1 tablet (4 mg total) by mouth 2 (two) times daily.   Unithroid 125 MCG tablet Generic drug: levothyroxine Take 1 tablet (125 mcg total) by mouth daily before breakfast. What changed: when to take this   Vitamin D-3 25 MCG (1000 UT) Caps Take 1 capsule by mouth daily.        Allergies  Allergen Reactions   Amoxil [Amoxicillin] Other (See Comments)    Pt states that it does not work for him when he had back to back pneumonia   Effexor [Venlafaxine] Diarrhea   Prozac [Fluoxetine Hcl] Diarrhea    Consultations: Neurology   Procedures/Studies: IR Fluoro Guide CV Line Right Result Date: 10/30/2023 INDICATION: 58 year old male with history of Guillain-Barre syndrome requiring central venous access for plasmapheresis. EXAM: NON-TUNNELED CENTRAL VENOUS HEMODIALYSIS CATHETER PLACEMENT WITH ULTRASOUND AND FLUOROSCOPIC GUIDANCE COMPARISON:  None Available. MEDICATIONS: None FLUOROSCOPY TIME:  6 seconds, 0 mGy COMPLICATIONS: None immediate.  PROCEDURE: Informed written consent was obtained from the patient after a discussion of the risks, benefits, and alternatives to treatment. Questions regarding the procedure were encouraged and answered. The right neck and chest were prepped with chlorhexidine in a sterile fashion, and a sterile drape was applied covering the operative field. Maximum barrier sterile technique with sterile gowns and gloves were used for the procedure. A timeout was performed prior to the initiation of the procedure. After the overlying soft tissues were anesthetized, a small venotomy incision was created and a micropuncture kit was utilized to access the internal jugular vein. Real-time ultrasound guidance was utilized for vascular access including the acquisition of a permanent ultrasound image documenting patency of the accessed vessel. The microwire was utilized to measure appropriate catheter length. A stiff glidewire was advanced to the level of the IVC. Under fluoroscopic guidance, the venotomy was serially dilated, ultimately allowing placement of a 16 cm temporary Trialysis catheter with tip ultimately terminating within the superior aspect of the right atrium. Final catheter positioning was confirmed and documented with a spot radiographic image. The catheter aspirates and flushes normally. The catheter was flushed with appropriate volume heparin dwells. The catheter exit site was secured with a 0 silk retention suture. A dressing was placed. The patient  tolerated the procedure well without immediate post procedural complication. IMPRESSION: Successful placement of a right internal jugular approach 16 cm temporary dialysis catheter with tip terminating with in the superior aspect of the right atrium. The catheter is ready for immediate use. PLAN: This catheter may be converted to a tunneled dialysis catheter at a later date as indicated. Marliss Coots, MD [facility] Radiology  Electronically Signed   By: Marliss Coots M.D.   On: 10/30/2023 08:06   IR US Guide Vasc Access Right Result Date: 10/30/2023 INDICATION: 58 year old male with history of Guillain-Barre syndrome requiring central venous access for plasmapheresis. EXAM: NON-TUNNELED CENTRAL VENOUS HEMODIALYSIS CATHETER PLACEMENT WITH ULTRASOUND AND FLUOROSCOPIC GUIDANCE COMPARISON:  None Available. MEDICATIONS: None FLUOROSCOPY TIME:  6 seconds, 0 mGy COMPLICATIONS: None immediate. PROCEDURE: Informed written consent was obtained from the patient after a discussion of the risks, benefits, and alternatives to treatment. Questions regarding the procedure were encouraged and answered. The right neck and chest were prepped with chlorhexidine in a sterile fashion, and a sterile drape was applied covering the operative field. Maximum barrier sterile technique with sterile gowns and gloves were used for the procedure. A timeout was performed prior to the initiation of the procedure. After the overlying soft tissues were anesthetized, a small venotomy incision was created and a micropuncture kit was utilized to access the internal jugular vein. Real-time ultrasound guidance was utilized for vascular access including the acquisition of a permanent ultrasound image documenting patency of the accessed vessel. The microwire was utilized to measure appropriate catheter length. A stiff glidewire was advanced to the level of the IVC. Under fluoroscopic guidance, the venotomy was serially dilated, ultimately allowing placement of a 16 cm temporary Trialysis catheter with tip ultimately terminating within the superior aspect of the right atrium. Final catheter positioning was confirmed and documented with a spot radiographic image. The catheter aspirates and flushes normally. The catheter was flushed with appropriate volume heparin dwells. The catheter exit site was secured with a 0 silk retention suture. A dressing was placed. The patient  tolerated the procedure well without immediate post procedural complication. IMPRESSION: Successful placement of a right internal jugular approach 16 cm temporary dialysis catheter with tip terminating with in the superior aspect of the right atrium. The catheter is ready for immediate use. PLAN: This catheter may be converted to a tunneled dialysis catheter at a later date as indicated. Marliss Coots, MD [facility] Radiology Electronically Signed   By: Marliss Coots M.D.   On: 10/30/2023 08:06   CT Head Wo Contrast Result Date: 10/25/2023 CLINICAL DATA:  MC-CTNeuro deficit, acute, stroke suspected EXAM: CT HEAD WITHOUT CONTRAST TECHNIQUE: Contiguous axial images were obtained from the base of the skull through the vertex without intravenous contrast. RADIATION DOSE REDUCTION: This exam was performed according to the departmental dose-optimization program which includes automated exposure control, adjustment of the mA and/or kV according to patient size and/or use of iterative reconstruction technique. COMPARISON:  None Available. FINDINGS: Brain: No acute intracranial hemorrhage. No focal mass lesion. No CT evidence of acute infarction. No midline shift or mass effect. No hydrocephalus. Basilar cisterns are patent. Vascular: No hyperdense vessel or unexpected calcification. Skull: Normal. Negative for fracture or focal lesion. Sinuses/Orbits: Paranasal sinuses and mastoid air cells are clear. Orbits are clear. Other: None. IMPRESSION: No acute intracranial findings. Electronically Signed   By: Genevive Bi M.D.   On: 10/25/2023 14:16   DG FL GUIDED LUMBAR PUNCTURE  Result Date: 10/12/2023 CLINICAL DATA:  Guillain-Barre syndrome. Request for diagnostic lumbar puncture. EXAM: DIAGNOSTIC LUMBAR PUNCTURE UNDER FLUOROSCOPIC GUIDANCE FLUOROSCOPY TIME:  Radiation Exposure Index (as provided by the fluoroscopic device): 0.80 mGy Number of Acquired Images:  1  PROCEDURE: Informed consent was obtained from the patient prior to the procedure, including potential complications of headache, allergy, and pain. With the patient prone, the lower back was prepped with Betadine. 1% Lidocaine was used for local anesthesia. Lumbar puncture was performed at the L4-L5 level using a 20 gauge needle with return of clear, colorless CSF with an opening pressure of 25 cm water. Approximately 14 ml of CSF were obtained for laboratory studies. Closing pressure obtained at 15 cm water. The patient tolerated the procedure well and there were no apparent complications. IMPRESSION: Technically successful lumbar puncture from L4-L5 level without complication. Procedure performed by Brayton El PA-C and supervised by Dr. Irish Lack Electronically Signed   By: Irish Lack M.D.   On: 10/12/2023 14:45   (Echo, Carotid, EGD, Colonoscopy, ERCP)    Subjective: Patient seen and examined.  Complains of being troubled by prednisone.  Patient tells me that he gets shaky, tremors, poor sleep with prednisone.  We discussed about starting on 40 mg daily.  Still has weakness of the wrists.  Eager to go to rehab.   Discharge Exam: Vitals:   11/10/23 0331 11/10/23 0739  BP: 136/84 (!) 153/83  Pulse: 63 73  Resp: 18   Temp: 98 F (36.7 C) 98.2 F (36.8 C)  SpO2: 95% 98%   Vitals:   11/09/23 2023 11/10/23 0006 11/10/23 0331 11/10/23 0739  BP: 128/79 127/72 136/84 (!) 153/83  Pulse: 75 72 63 73  Resp: 18 18 18    Temp: 98.1 F (36.7 C) 98.2 F (36.8 C) 98 F (36.7 C) 98.2 F (36.8 C)  TempSrc: Oral Oral Oral Oral  SpO2: 95% 97% 95% 98%  Weight:      Height:        General: Pt is alert, awake, not in acute distress Cardiovascular: RRR, S1/S2 +, no rubs, no gallops Respiratory: CTA bilaterally, no wheezing, no rhonchi Abdominal: Soft, NT, ND, bowel sounds + Extremities: no edema, no cyanosis Both upper extremity with profound weakness of the wrist extensor 2/5.   Proximal muscle groups are 4/5.  Lower extremity 4/5     The results of significant diagnostics from this hospitalization (including imaging, microbiology, ancillary and laboratory) are listed below for reference.     Microbiology: No results found for this or any previous visit (from the past 240 hours).   Labs: BNP (last 3 results) Recent Labs    09/30/23 1553  BNP 4.4   Basic Metabolic Panel: Recent Labs  Lab 11/04/23 0850 11/06/23 0510 11/07/23 0524 11/08/23 0659 11/09/23 0703  NA 138 138 139 136 138  K 3.1* 4.2 3.9 3.9 4.2  CL 102 101 105 102 105  CO2 27 28 26 25 24   GLUCOSE 115* 80 80 83 77  BUN 11 9 11 10 12   CREATININE 0.88 0.69 0.96 0.94 0.74  CALCIUM 8.5* 9.2 8.8* 9.0 9.1  MG  --  2.3  --   --  2.2  PHOS  --   --   --   --  3.7   Liver Function Tests: Recent Labs  Lab 11/06/23 0510  AST 20  ALT 34  ALKPHOS 21*  BILITOT 0.7  PROT 5.6*  ALBUMIN 4.2   No results for input(s): "LIPASE", "AMYLASE"  in the last 168 hours. No results for input(s): "AMMONIA" in the last 168 hours. CBC: Recent Labs  Lab 11/04/23 0850 11/06/23 0510 11/07/23 0524 11/08/23 0659 11/09/23 0703  WBC 13.2* 16.8* 19.3* 16.1* 15.7*  NEUTROABS 6.8 7.9*  --   --   --   HGB 13.2 13.8 13.4 13.7 13.7  HCT 37.2* 40.8 39.4 40.7 40.7  MCV 89.0 90.7 91.0 90.0 91.1  PLT 275 308 306 332 341   Cardiac Enzymes: No results for input(s): "CKTOTAL", "CKMB", "CKMBINDEX", "TROPONINI" in the last 168 hours. BNP: Invalid input(s): "POCBNP" CBG: No results for input(s): "GLUCAP" in the last 168 hours. D-Dimer No results for input(s): "DDIMER" in the last 72 hours. Hgb A1c No results for input(s): "HGBA1C" in the last 72 hours. Lipid Profile No results for input(s): "CHOL", "HDL", "LDLCALC", "TRIG", "CHOLHDL", "LDLDIRECT" in the last 72 hours. Thyroid function studies No results for input(s): "TSH", "T4TOTAL", "T3FREE", "THYROIDAB" in the last 72 hours.  Invalid input(s):  "FREET3" Anemia work up No results for input(s): "VITAMINB12", "FOLATE", "FERRITIN", "TIBC", "IRON", "RETICCTPCT" in the last 72 hours. Urinalysis    Component Value Date/Time   COLORURINE YELLOW 08/18/2023 1114   APPEARANCEUR CLEAR 08/18/2023 1114   LABSPEC <=1.005 (A) 08/18/2023 1114   PHURINE 6.0 08/18/2023 1114   GLUCOSEU NEGATIVE 08/18/2023 1114   HGBUR NEGATIVE 08/18/2023 1114   HGBUR negative 04/02/2010 0911   BILIRUBINUR NEGATIVE 08/18/2023 1114   KETONESUR NEGATIVE 08/18/2023 1114   UROBILINOGEN 0.2 08/18/2023 1114   NITRITE NEGATIVE 08/18/2023 1114   LEUKOCYTESUR NEGATIVE 08/18/2023 1114   Sepsis Labs Recent Labs  Lab 11/06/23 0510 11/07/23 0524 11/08/23 0659 11/09/23 0703  WBC 16.8* 19.3* 16.1* 15.7*   Microbiology No results found for this or any previous visit (from the past 240 hours).   Time coordinating discharge: 35 minutes  SIGNED:   Dorcas Carrow, MD  [facility] 11/10/2023, 9:48 AM

## 2023-11-10 NOTE — Progress Notes (Signed)
Inpatient Rehabilitation Care Coordinator Assessment and Plan Patient Details  Name: Jonathan Luna MRN: [id number] Date of Birth: 08/08/1966  Today's Date: 11/10/2023  Hospital Problems: Principal Problem:   CIDP (chronic inflammatory demyelinating polyneuropathy) [facility])  Past Medical History:  Past Medical History:  Diagnosis Date   Allergy    Anxiety    Arthritis    Cataract    Community acquired pneumonia 08/28/2023   Depression    Detached retina    Heart murmur    History of bilateral cataract extraction    Hyperlipidemia    Hypothyroidism    Low back pain    Panic attack    Panic attacks    Pneumonia of right upper lobe due to infectious organism 08/18/2023   Past Surgical History:  Past Surgical History:  Procedure Laterality Date   CATARACT EXTRACTION     EYE SURGERY     IR FLUORO GUIDE CV LINE RIGHT  10/29/2023   IR US GUIDE VASC ACCESS RIGHT  10/29/2023   Social History:  reports that he has never smoked. He has been exposed to tobacco smoke. He has never used smokeless tobacco. He reports that he does not currently use alcohol. He reports that he does not use drugs.  Family / Support Systems Marital Status: Married How Long?: 11 years Patient Roles: Spouse, Other (Comment) (employee) Spouse/Significant Other: Clydie Braun [phone] Children: no children Other Supports: Trey Paula friend Anticipated Caregiver: wife Ability/Limitations of Caregiver: wife is blind and also works during the day, so pt will be alone while she works Medical laboratory scientific officer: Evenings only Family Dynamics: Pt is close with his wife and freinds, he was doing well until he became really weak and declined  Social History Preferred language: English Religion: Christian Cultural Background: No issues Education: Charity fundraiser - How often do you need to have someone help you when you read instructions, pamphlets, or other written material from your doctor or pharmacy?: Never Writes:  Yes Employment Status: Employed 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 Return to Work Plans: Has FMLA/STD in place and currently on due to last hospitalization Legal History/Current Legal Issues: NA Guardian/Conservator: NA MD feels pt is capable of making his own decisions while here   Abuse/Neglect Abuse/Neglect Assessment Can Be Completed: Yes Physical Abuse: Denies Verbal Abuse: Denies Sexual Abuse: Denies Exploitation of patient/patient's resources: Denies Self-Neglect: Denies  Patient response to: Social Isolation - How often do you feel lonely or isolated from those around you?: Never  Emotional Status Pt's affect, behavior and adjustment status: Pt is matter of fact in having to start all over again he was doing well after left 10/20/2023 and then started to decline in his strength and now is here again. Recent Psychosocial Issues: health issues and recent decline and now back here Psychiatric History: History of anxiety and takes medications saw neuro-psych last admission and felt it was helpful. Will ask him to see pt again on this admission. Substance Abuse History: NA  Patient / Family Perceptions, Expectations & Goals Pt/Family understanding of illness & functional limitations: Pt is able to explain his condition and plan moving forward from here. He hopes the medications work and he is willing to work hard in therapies again. Premorbid pt/family roles/activities: Husband, employee, friend, etc Anticipated changes in roles/activities/participation: plans to resume Pt/family expectations/goals: Pt states: " I hope to do well again and leave here mod/i with my rollator, my wife is working now too."  [employer]: None Premorbid Home Care/DME Agencies:  Other (Comment) ([facility] OP and has rollator and tub seat) Transportation available at discharge: Self and friend along with ride share Is the patient able to respond to transportation  needs?: Yes In the past 12 months, has lack of transportation kept you from medical appointments or from getting medications?: No In the past 12 months, has lack of transportation kept you from meetings, work, or from getting things needed for daily living?: No Resource referrals recommended: Neuropsychology  Discharge Planning Living Arrangements: Spouse/significant other Support Systems: Spouse/significant other, Friends/neighbors Type of Residence: Private residence Community education officer Resources: Media planner (specify) Education officer, museum) Financial Resources: Employment, Garment/textile technologist Screen Referred: No Living Expenses: Own Money Management: Patient, Spouse Does the patient have any problems obtaining your medications?: No Home Management: Pt cooks and wife cleans Patient/Family Preliminary Plans: Return home with wife who does work during the day and is blind. Pt hopes to be mod/i levle and get back to his mod/i with his roillator. Was just here 1/7 Care Coordinator Barriers to Discharge: Decreased caregiver support, Lack of/limited family support, Insurance for SNF coverage Care Coordinator Anticipated Follow Up Needs: HH/OP  Clinical Impression Pleasant gentleman who is well known to this worekr since wss just here 10/20/2023 and now is back due to same issue. He is hopeful he will do well but is somewhat discouraged due to having to start over again His wife is involved but does work and is blind. Will place on neuro-psych list and work on discharge needs.  Lucy Chris 11/10/2023, 2:54 PM

## 2023-11-10 NOTE — Progress Notes (Signed)
Inpatient Rehab Admissions Coordinator:    I have insurance approval and a bed available for pt to admit to CIR today. Pt aware and wants to pursue CIR.  Dr. Jerral Ralph in agreement and [facility] team aware.   Estill Dooms, PT, DPT Admissions Coordinator [phone] 11/10/23  9:46 AM

## 2023-11-10 NOTE — Progress Notes (Signed)
Genice Rouge, MD  Physician Physical Medicine and Rehabilitation   PMR Pre-admission    Signed   Date of Service: 11/10/2023  9:51 AM  Related encounter: ED to Hosp-Admission (Current) from 10/29/2023 in [facility]   Signed     Expand All Collapse All  PMR Admission Coordinator Pre-Admission Assessment   Patient: Jonathan Luna is an 58 y.o., male MRN: [id number] DOB: 1966/10/07 Height: 5\' 7"  (170.2 cm) Weight: 81.6 kg                                                                                                                                                  Insurance Information HMO:     PPO:      PCP:      IPA:      80/20:      OTHER:  PRIMARY: [employer]      Policy#: [id number]      Subscriber: pt CM Name: Tish      Phone#: not provided     Fax#: [phone] Pre-Cert#: [id number] auth from [facility] with updates due on 2/3 to [place] at fax listed above (ext [phone])      Employer:  Benefits:  Phone #: [phone]     Name:  Eff. Date: 12/12/22     Deduct: $3500 (met)      Out of Pocket Max: $7000 (met)      Life Max:   CIR: 70%      SNF: 70% Outpatient:      Co-Pay: $30/visit Home Health: 70%      Co-Pay: 30% DME: 70%     Co-Pay: 30% Providers:  SECONDARY:       Policy#:       Phone#:    Artist:       Phone#:    The Engineer, materials Information Summary" for patients in Inpatient Rehabilitation Facilities with attached "Privacy Act Statement-Health Care Records" was provided and verbally reviewed with: Patient and Family   Emergency Contact Information Contact Information       Name Relation Home Work Mobile    Lokken,Karen Spouse [phone]   [phone]    Gregory, Barrick     [phone]         Other Contacts   None on File      Current Medical History  Patient Admitting Diagnosis: CIDP   History of Present Illness: Jonathan Luna is a 58 y.o. male with PMHx of  has a past medical history of Allergy, Anxiety, Arthritis, Cataract,  Community acquired pneumonia (08/28/2023), Depression, Detached retina, Heart murmur, History of bilateral cataract extraction, Hyperlipidemia, Hypothyroidism, Low back pain, Panic attack, Panic attacks, and Pneumonia of right upper lobe due to infectious organism (08/18/2023). . They were admitted to [facility] on 10/29/2023 for worsening weakness and paresthesias, evaluated in neurology  clinic by Dr. 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  This patient is well-known to the PM&R service due to recent inpatient rehab stay 12-23 to 10-20-2023 for the same.  He has undergone plasmapheresis 1-17, 18, and 20, with next planned today.  He remains on high-dose steroids, with NIFs -40 vital capacity appropriate.  This hospitalization has been complicated by hyponatremia, hypokalemia, hypertension, hypothyroidism, anxiety, and chronic pain with worsening neuropathy.  Therapy evaluations completed and pt was recommended for CIR.  Glasgow Coma Scale Score: 15   Patient's medical record from [facility] has been reviewed by the rehabilitation admission coordinator and physician.   Past Medical History      Past Medical History:  Diagnosis Date   Allergy     Anxiety     Arthritis     Cataract     Community acquired pneumonia 08/28/2023   Depression     Detached retina     Heart murmur     History of bilateral cataract extraction     Hyperlipidemia     Hypothyroidism     Low back pain     Panic attack     Panic attacks     Pneumonia of right upper lobe due to infectious organism 08/18/2023          Has the patient had major surgery during 100 days prior to admission? No   Family History  family history includes Asthma in his father; Breast cancer in his mother; Diabetes in his brother; Emphysema in his father; Heart disease in his father; Hyperlipidemia in his brother; Hypertension in his father.     Current Medications   Current Medications     Current Facility-Administered Medications:    acetaminophen (TYLENOL) tablet 650 mg, 650 mg, Oral, Q6H PRN, 650 mg at 11/04/23 2056 **OR** acetaminophen (TYLENOL) suppository 650 mg, 650 mg, Rectal, Q6H PRN, Synetta Fail, MD   acetaminophen (TYLENOL) tablet 650 mg, 650 mg, Oral, Q4H PRN, Rejeana Brock, MD, 650 mg at 11/06/23 0939   albuterol (VENTOLIN HFA) 108 (90 Base) MCG/ACT inhaler 2 puff, 2 puff, Inhalation, Q4H PRN, Dorcas Carrow, MD, 2 puff at 11/06/23 0816   aspirin EC tablet 81 mg, 81 mg, Oral, Daily, Dorcas Carrow, MD, 81 mg at 11/09/23 7829   calcium carbonate (TUMS - dosed in mg elemental calcium) chewable tablet 200 mg of elemental calcium, 1 tablet, Oral, BID PRN, Howerter, Justin B, DO, 200 mg of elemental calcium at 11/05/23 2003   citrate dextrose (ACD-A anticoagulant) solution 1,000 mL, 1,000 mL, Other, Continuous, Rejeana Brock, MD, 1,000 mL at 11/06/23 1113   clonazePAM (KLONOPIN) tablet 0.5 mg, 0.5 mg, Oral, BID PRN, Synetta Fail, MD, 0.5 mg at 11/08/23 2059   dextromethorphan-guaiFENesin (MUCINEX DM) 30-600 MG per 12 hr tablet 1 tablet, 1 tablet, Oral, BID, Dorcas Carrow, MD, 1 tablet at 11/09/23 0824   diphenhydrAMINE (BENADRYL) capsule 25 mg, 25 mg, Oral, Q6H PRN, Rejeana Brock, MD, 25 mg at 11/08/23 2059   DULoxetine (CYMBALTA) DR capsule 60 mg, 60 mg, Oral, Daily, Synetta Fail, MD, 60 mg at 11/09/23 0824   enoxaparin (LOVENOX) injection 40 mg, 40 mg, Subcutaneous, Q24H, Synetta Fail, MD, 40 mg at 11/08/23 1840   gabapentin (NEURONTIN) capsule 900 mg, 900 mg, Oral, TID, Synetta Fail, MD, 900 mg at 11/09/23 0825   gabapentin (NEURONTIN) capsule 900 mg, 900 mg, Oral, Daily PRN, Dorcas Carrow, MD, 900  mg at 11/05/23 1900   guaiFENesin-dextromethorphan (ROBITUSSIN DM) 100-10 MG/5ML syrup 5 mL, 5 mL, Oral, Q4H PRN, Synetta Fail, MD, 5 mL at 10/30/23 1610   heparin sodium (porcine) injection 1,000 Units,  1,000 Units, Intracatheter, Once, Rejeana Brock, MD   irbesartan (AVAPRO) tablet 300 mg, 300 mg, Oral, Daily, Synetta Fail, MD, 300 mg at 11/09/23 9604   levothyroxine (SYNTHROID) tablet 125 mcg, 125 mcg, Oral, Q0600, Synetta Fail, MD, 125 mcg at 11/09/23 0419   metoprolol succinate (TOPROL-XL) 24 hr tablet 25 mg, 25 mg, Oral, Daily, Synetta Fail, MD, 25 mg at 11/09/23 5409   nicotine polacrilex (NICORETTE) gum 2 mg, 2 mg, Oral, PRN, John Giovanni, MD, 2 mg at 10/30/23 0544   Oral care mouth rinse, 15 mL, Mouth Rinse, PRN, Synetta Fail, MD   oxyCODONE-acetaminophen (PERCOCET/ROXICET) 5-325 MG per tablet 1 tablet, 1 tablet, Oral, Q4H PRN, Dorcas Carrow, MD, 1 tablet at 11/09/23 1340   polyethylene glycol (MIRALAX / GLYCOLAX) packet 17 g, 17 g, Oral, Daily PRN, Synetta Fail, MD   predniSONE (DELTASONE) tablet 60 mg, 60 mg, Oral, Q breakfast, Richardo Priest, Erin C, NP, 60 mg at 11/09/23 8119   sertraline (ZOLOFT) tablet 25 mg, 25 mg, Oral, Daily, Synetta Fail, MD, 25 mg at 11/09/23 1478     Patients Current Diet:  Diet Order                  Diet regular Room service appropriate? Yes; Fluid consistency: Thin  Diet effective now                         Precautions / Restrictions Precautions Precautions: Fall Restrictions Weight Bearing Restrictions Per Provider Order: No    Has the patient had 2 or more falls or a fall with injury in the past year?No   Prior Activity Level Limited Community (1-2x/wk): mod I since d/c from CIR earlier this month, using rollator   Prior Functional Level Prior Function Prior Level of Function : Independent/Modified Independent, History of Falls (last six months) Mobility Comments: uses rollator ADLs Comments: was ind up until this admission, spouse has assist for ADLs   Self Care: Did the patient need help bathing, dressing, using the toilet or eating?  Independent   Indoor Mobility: Did the  patient need assistance with walking from room to room (with or without device)? Independent   Stairs: Did the patient need assistance with internal or external stairs (with or without device)? Independent   Functional Cognition: Did the patient need help planning regular tasks such as shopping or remembering to take medications? Independent   Patient Information Are you of Hispanic, Latino/a,or Spanish origin?: A. No, not of Hispanic, Latino/a, or Spanish origin What is your race?: A. White Do you need or want an interpreter to communicate with a doctor or health care staff?: 0. No   Patient's Response To:  Health Literacy and Transportation Is the patient able to respond to health literacy and transportation needs?: Yes Health Literacy - How often do you need to have someone help you when you read instructions, pamphlets, or other written material from your doctor or pharmacy?: Never In the past 12 months, has lack of transportation kept you from medical appointments or from getting medications?: No In the past 12 months, has lack of transportation kept you from meetings, work, or from getting things needed for daily living?: No   Home Assistive Devices /  Equipment Home Equipment: Tub bench, Rollator (4 wheels), Grab bars - tub/shower   Prior Device Use: Indicate devices/aids used by the patient prior to current illness, exacerbation or injury?  rollator   Current Functional Level Cognition   Overall Cognitive Status: Within Functional Limits for tasks assessed Orientation Level: Oriented X4 General Comments: vebalizing more awareness of understanding about overexertion from previous session/education from other therapists    Extremity Assessment (includes Sensation/Coordination)   Upper Extremity Assessment: RUE deficits/detail, Right hand dominant RUE Deficits / Details: stronger proximally vs distally, 3+/5 shoulder/elbow ROM, 2/5 grasp, minimal wrist extension noted, tremoring  noted, can abd/add digits, can extend thumb with hand pronated, can oppose all digits except to pinky RUE Coordination: decreased fine motor, decreased gross motor LUE Deficits / Details: stronger proximally vs distally, 3+/5 shoulder/elbow ROM, 2/5 grasp, minimal wrist extension noted, tremoring noted, can oppose all digits, decr digit extension LUE Coordination: decreased fine motor, decreased gross motor  Lower Extremity Assessment: Defer to PT evaluation RLE Deficits / Details: knee extension 3-, ankle DF1+ LLE Deficits / Details: knee extension 3-, ankle DF1+     ADLs   Overall ADL's : Needs assistance/impaired Eating/Feeding: Set up, With adaptive utensils, With assist to don/doff brace/orthosis Eating/Feeding Details (indicate cue type and reason): weighted fork Grooming: Minimal assistance, Oral care, Wash/dry face Grooming Details (indicate cue type and reason): using wrist support cuff to brush teeth with RUE Upper Body Bathing: Moderate assistance, Sitting Lower Body Bathing: Sitting/lateral leans, Maximal assistance Lower Body Bathing Details (indicate cue type and reason): supported afo/shoe and had pt. practice management of velcro required max a, attempted shoe laces pt. able to do the initial crossing of laces but unable to hold and make the bow portions.  reviewed importance of cont. work with "pinch" exercises with theraputty and foam as precurser to gain strength for this portion of managing shoes/afos Upper Body Dressing : Moderate assistance, Sitting Lower Body Dressing: Moderate assistance, Sitting/lateral leans Toilet Transfer: Minimal assistance Toileting- Clothing Manipulation and Hygiene: Minimal assistance Functional mobility during ADLs: Minimal assistance, Rolling walker (2 wheels) General ADL Comments: session focused on UE exercises and seated ADL     Mobility   Overal bed mobility: Modified Independent General bed mobility comments: incr time/effort supine to  sit to supine wth HOB flat     Transfers   Overall transfer level: Needs assistance Equipment used: Rolling walker (2 wheels) Transfers: Sit to/from Stand Sit to Stand: Contact guard assist General transfer comment: CGA for safety, from EOB, able to complete x5 at end of session with focus on eccentric control to sitting with cues for increased trunk flexion     Ambulation / Gait / Stairs / Wheelchair Mobility   Ambulation/Gait Ambulation/Gait assistance: Editor, commissioning (Feet): 168 Feet Assistive device: Rolling walker (2 wheels) Gait Pattern/deviations: Step-through pattern, Decreased stride length, Decreased dorsiflexion - right, Decreased dorsiflexion - left, Knee hyperextension - right, Knee hyperextension - left, Steppage General Gait Details: increased UE tremoring throughout session, pt needing x2 standing rest breaks as pt with increased fatigue, min A to steady and maintain balance Gait velocity: decr Gait velocity interpretation: 1.31 - 2.62 ft/sec, indicative of limited community ambulator     Posture / Balance Dynamic Sitting Balance Sitting balance - Comments: sits unsupported EOB Balance Overall balance assessment: Needs assistance Sitting-balance support: No upper extremity supported, Feet unsupported Sitting balance-Leahy Scale: Good Sitting balance - Comments: sits unsupported EOB Standing balance support: During functional activity, Reliant on assistive device  for balance Standing balance-Leahy Scale: Poor Standing balance comment: reliant on UE support during dynamic tasks     Special needs/care consideration N/a        Previous Home Environment (from acute therapy documentation) Living Arrangements: Spouse/significant other Available Help at Discharge: Family, Available PRN/intermittently Type of Home: House Home Layout: One level Home Access: Stairs to enter Entrance Stairs-Rails: None Entrance Stairs-Number of Steps: 1 Bathroom Shower/Tub:  Associate Professor: No How Accessible:  (rollator does not fit in bathroom) Home Care Services: No   Discharge Living Setting Plans for Discharge Living Setting: Patient's home, Lives with (comment) (spouse) Type of Home at Discharge: House Discharge Home Layout: One level Discharge Home Access: Stairs to enter Entrance Stairs-Rails: None Entrance Stairs-Number of Steps: 1 Discharge Bathroom Shower/Tub: Tub/shower unit Discharge Bathroom Toilet: Standard Discharge Bathroom Accessibility: No Does the patient have any problems obtaining your medications?: No   Social/Family/Support Systems Patient Roles: Spouse Anticipated Caregiver: wife, Clydie Braun Anticipated Caregiver's Contact Information: [phone] Ability/Limitations of Caregiver: wife is legally blind Caregiver Availability: 24/7 Discharge Plan Discussed with Primary Caregiver: Yes Is Caregiver In Agreement with Plan?: Yes Does Caregiver/Family have Issues with Lodging/Transportation while Pt is in Rehab?: No     Goals Patient/Family Goal for Rehab: PT/OT mod I, SLP n/a Expected length of stay: 8-12 days Additional Information: Discharge plan: home with spouse for 24/7 supervision, but excpect mod I level Pt/Family Agrees to Admission and willing to participate: Yes Program Orientation Provided & Reviewed with Pt/Caregiver Including Roles  & Responsibilities: Yes     Decrease burden of Care through IP rehab admission: no     Possible need for SNF placement upon discharge: No.  Plan for d/c home with spouse.  She is legally blind, can provide some support if needed but expect mod I level at discharge.      Patient Condition: This patient's medical and functional status has changed since the consult dated: 11/06/23 in which the Rehabilitation Physician determined and documented that the patient's condition is appropriate for intensive rehabilitative care in an inpatient  rehabilitation facility. See "History of Present Illness" (above) for medical update. Functional changes are: pt min assist with mobility/adls. Patient's medical and functional status update has been discussed with the Rehabilitation physician and patient remains appropriate for inpatient rehabilitation. Will admit to inpatient rehab today.   Preadmission Screen Completed By:  Stephania Fragmin, PT, DPT 11/09/2023 3:54 PM ______________________________________________________________________   Discussed status with Dr. Berline Chough on 11/10/23 at 9:51 AM  and received approval for admission today.   Admission Coordinator:  Stephania Fragmin, PT, DPT time 9:51 AM Dorna Bloom 11/10/23              Revision History

## 2023-11-10 NOTE — Progress Notes (Signed)
Inpatient Rehabilitation Admission Medication Review by a Pharmacist   A complete drug regimen review was completed for this patient to identify any potential clinically significant medication issues.   High Risk Drug Classes Is patient taking? Indication by Medication  Antipsychotic No    Anticoagulant Yes Lovenox- vte ppx  Antibiotic No    Opioid Yes Percocet- acute pain  Antiplatelet yes Aspirin- cva ppx  Hypoglycemics/insulin No    Vasoactive Medication Yes Avapro, Toprol- HTN  Chemotherapy No    Other Yes Benadryl- itching Cymbalta- neuropathic pain Zoloft- MDD/anxiety Synthroid- hypothyroidism Gabapentin- neuropathic pain Clonazepam- anxiety        Type of Medication Issue Identified Description of Issue Recommendation(s)  Drug Interaction(s) (clinically significant)        Duplicate Therapy        Allergy        No Medication Administration End Date        Incorrect Dose        Additional Drug Therapy Needed        Significant med changes from prior encounter (inform family/care partners about these prior to discharge).      Other   PTA meds: Vitamin D Flonase Zanaflex Restart PTA meds when and if necessary during CIR admission or at time of discharge, if warranted        Clinically significant medication issues were identified that warrant physician communication and completion of prescribed/recommended actions by midnight of the next day:  No     Time spent performing this drug regimen review (minutes):  30     Ante Arredondo BS, PharmD, BCPS Clinical Pharmacist 11/10/2023 13:59 PM   Contact: [phone] after 3 PM   "Be curious, not judgmental..." -Debbora Dus  Intervention History A

## 2023-11-10 NOTE — Progress Notes (Signed)
Inpatient Rehabilitation Center Individual Statement of Services  Patient Name:  Jonathan Luna  Date:  11/10/2023  Welcome to the Inpatient Rehabilitation Center.  Our goal is to provide you with an individualized program based on your diagnosis and situation, designed to meet your specific needs.  With this comprehensive rehabilitation program, you will be expected to participate in at least 3 hours of rehabilitation therapies Monday-Friday, with modified therapy programming on the weekends.  Your rehabilitation program will include the following services:  Physical Therapy (PT), Occupational Therapy (OT), 24 hour per day rehabilitation nursing, Neuropsychology, Care Coordinator, Rehabilitation Medicine, Nutrition Services, and Pharmacy Services  Weekly team conferences will be held on Tuesday to discuss your progress.  Your Inpatient Rehabilitation Care Coordinator will talk with you frequently to get your input and to update you on team discussions.  Team conferences with you and your family in attendance may also be held.  Expected length of stay: 14-16 days  Overall anticipated outcome: Independent with device  Depending on your progress and recovery, your program may change. Your Inpatient Rehabilitation Care Coordinator will coordinate services and will keep you informed of any changes. Your Inpatient Rehabilitation Care Coordinator's name and contact numbers are listed  below.  The following services may also be recommended but are not provided by the Inpatient Rehabilitation Center:   Home Health Rehabiltiation Services Outpatient Rehabilitation Services Vocational Rehabilitation   Arrangements will be made to provide these services after discharge if needed.  Arrangements include referral to agencies that provide these services.  Your insurance has been verified to be:  [facility] Your primary doctor is:  Sanda Linger  Pertinent information will be shared with your doctor and your insurance  company.  Inpatient Rehabilitation Care Coordinator:  Dossie Der, [place] [phone] or [name]  Information discussed with and copy given to patient by: Lucy Chris, 11/10/2023, 2:55 PM

## 2023-11-10 NOTE — Progress Notes (Signed)
NIF & IS performed with great effort. NIF  > -40 cmH2O with ease. NIF gauge only goes to -40.  IS  2500 ml

## 2023-11-10 NOTE — Progress Notes (Signed)
[name], DO  Physician Physical Medicine and Rehabilitation   Consult Note    Signed   Date of Service: 11/06/2023 12:40 PM  Related encounter: ED to Hosp-Admission (Current) from 10/29/2023 in [place] [facility]   Signed     Expand All Collapse All           Physical Medicine and Rehabilitation Consult Reason for Consult: Evaluate appropriateness for Inpatient Rehab Referring Physician: Dr. Meriam Sprague       HPI: Jonathan Luna is a 58 y.o. male with PMHx of  has a past medical history of Allergy, Anxiety, Arthritis, Cataract, Community acquired pneumonia (08/28/2023), Depression, Detached retina, Heart murmur, History of bilateral cataract extraction, Hyperlipidemia, Hypothyroidism, Low back pain, Panic attack, Panic attacks, and Pneumonia of right upper lobe due to infectious organism (08/18/2023). . They were admitted to [facility] on 10/29/2023 for worsening weakness and paresthesias, evaluated in neurology clinic by Dr. 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  This patient is well-known to the PM&R service due to recent inpatient rehab stay 12-23 to 10-20-2023 for the same.  He has undergone plasmapheresis 1-17, 18, and 20, with next planned today.  He remains on high-dose steroids, with NIFs -40 vital capacity appropriate.  This hospitalization has been complicated by hyponatremia, hypokalemia, hypertension, hypothyroidism, anxiety, and chronic pain with worsening neuropathy.  PM&R was consulted to evaluate appropriateness for IPR admission.    Per current therapy notes, he is max assist with lateral leaning for lower body bathing/dressing, otherwise contact-guard assist for transfers and gait with rolling walker up to 168 feet.  During patient's last inpatient rehab stay, he was modified independent of ADLs and able to ambulate 664 feet at a modified independent level with use of a rollator.  Plan is still to return home  with single level access and wife who is available to provide support.   Review of Systems  Constitutional:  Positive for malaise/fatigue. Negative for chills and fever.  HENT:  Negative for hearing loss and sore throat.   Eyes:  Negative for blurred vision and double vision.  Respiratory:  Negative for cough and shortness of breath.   Cardiovascular:  Positive for orthopnea and leg swelling. Negative for chest pain and palpitations.  Gastrointestinal:  Negative for abdominal pain, constipation, diarrhea, nausea and vomiting.  Genitourinary:  Negative for dysuria and urgency.  Musculoskeletal:  Negative for joint pain and neck pain.  Skin:  Positive for itching. Negative for rash.  Neurological:  Positive for tingling, tremors, sensory change and focal weakness. Negative for dizziness and headaches.  Psychiatric/Behavioral:  The patient is nervous/anxious and has insomnia.         Past Medical History:  Diagnosis Date   Allergy     Anxiety     Arthritis     Cataract     Community acquired pneumonia 08/28/2023   Depression     Detached retina     Heart murmur     History of bilateral cataract extraction     Hyperlipidemia     Hypothyroidism     Low back pain     Panic attack     Panic attacks     Pneumonia of right upper lobe due to infectious organism 08/18/2023             Past Surgical History:  Procedure Laterality Date   CATARACT EXTRACTION       EYE  SURGERY       IR FLUORO GUIDE CV LINE RIGHT   10/29/2023   IR US GUIDE VASC ACCESS RIGHT   10/29/2023             Family History  Problem Relation Age of Onset   Breast cancer Mother     Hypertension Father     Heart disease Father     Asthma Father     Emphysema Father          smoker for 45 years   Diabetes Brother     Hyperlipidemia Brother          Social History:  reports that he has never smoked. He has been exposed to tobacco smoke. He has never used smokeless tobacco. He reports that he does not  currently use alcohol. He reports that he does not use drugs. Allergies:  Allergies       Allergies  Allergen Reactions   Amoxil [Amoxicillin] Other (See Comments)      Pt states that it does not work for him when he had back to back pneumonia   Effexor [Venlafaxine] Diarrhea   Prozac [Fluoxetine Hcl] Diarrhea            Medications Prior to Admission  Medication Sig Dispense Refill   albuterol (VENTOLIN HFA) 108 (90 Base) MCG/ACT inhaler Inhale 1-2 puffs into the lungs every 6 (six) hours as needed for wheezing or shortness of breath. 1 each 0   aspirin EC 81 MG tablet Take 81 mg by mouth daily. Swallow whole.       Cholecalciferol (VITAMIN D-3) 25 MCG (1000 UT) CAPS Take 1 capsule by mouth daily.       clonazePAM (KLONOPIN) 0.5 MG tablet Take 1 tablet (0.5 mg total) by mouth 2 (two) times daily as needed for anxiety. 15 tablet 0   DULoxetine (CYMBALTA) 60 MG capsule Take 1 capsule (60 mg total) by mouth daily. 30 capsule 11   fluticasone (FLONASE) 50 MCG/ACT nasal spray Place 2 sprays into both nostrils daily. 48 g 1   gabapentin (NEURONTIN) 300 MG capsule Take 3 capsules (900 mg total) by mouth 3 (three) times daily. 270 capsule 11   irbesartan (AVAPRO) 300 MG tablet Take 1 tablet (300 mg total) by mouth daily. 30 tablet 0   metoprolol succinate (TOPROL XL) 25 MG 24 hr tablet Take 1 tablet (25 mg total) by mouth daily. 30 tablet 0   oxyCODONE-acetaminophen (PERCOCET/ROXICET) 5-325 MG tablet Take 1 tablet by mouth every 8 (eight) hours as needed for severe pain (pain score 7-10). 90 tablet 0   sertraline (ZOLOFT) 25 MG tablet Take 1 tablet (25 mg total) by mouth daily. 30 tablet 0   tiZANidine (ZANAFLEX) 4 MG tablet Take 1 tablet (4 mg total) by mouth 2 (two) times daily. 60 tablet 0   UNITHROID 125 MCG tablet Take 1 tablet (125 mcg total) by mouth daily before breakfast. (Patient taking differently: Take 125 mcg by mouth at bedtime.) 90 tablet 0   Oxcarbazepine (TRILEPTAL) 300 MG  tablet Take 1 tablet (300 mg total) by mouth 2 (two) times daily. (Patient not taking: Reported on 10/29/2023) 60 tablet 11   predniSONE (DELTASONE) 10 MG tablet Take 6 tablets (60 mg total) by mouth daily with breakfast. (Patient not taking: Reported on 10/29/2023) 180 tablet 6          Home: Home Living Family/patient expects to be discharged to:: Private residence Living Arrangements: Spouse/significant other Available Help at Discharge:  Family, Available PRN/intermittently Type of Home: House Home Access: Stairs to enter Secretary/administrator of Steps: 1 Entrance Stairs-Rails: None Home Layout: One level Bathroom Shower/Tub: Engineer, manufacturing systems: Standard Bathroom Accessibility: No Home Equipment: Tub bench, Rollator (4 wheels), Grab bars - tub/shower  Functional History: Prior Function Prior Level of Function : Independent/Modified Independent, History of Falls (last six months) Mobility Comments: uses rollator ADLs Comments: was ind up until this admission, spouse has assist for ADLs Functional Status:  Mobility: Bed Mobility Overal bed mobility: Modified Independent General bed mobility comments: HOB elevated; incr time/effort supine to sit to supine, on R and L side of bed Transfers Overall transfer level: Needs assistance Equipment used: Rolling walker (2 wheels) Transfers: Sit to/from Stand Sit to Stand: Contact guard assist General transfer comment: CGA for safety, from EOB Ambulation/Gait Ambulation/Gait assistance: Contact guard assist Gait Distance (Feet): 168 Feet Assistive device: Rolling walker (2 wheels) Gait Pattern/deviations: Step-through pattern, Decreased stride length, Decreased dorsiflexion - right, Decreased dorsiflexion - left, Knee hyperextension - right, Knee hyperextension - left, Steppage General Gait Details: AFOs donned with improved LE clearance and improved general stability; vc for proximity to RW, noted tremoring in UEs with  fatigue last ~40', decreased bil knee control with hyperextension noted Gait velocity: decr Gait velocity interpretation: 1.31 - 2.62 ft/sec, indicative of limited community ambulator   ADL: ADL Overall ADL's : Needs assistance/impaired Eating/Feeding: Set up, With adaptive utensils, With assist to don/doff brace/orthosis Eating/Feeding Details (indicate cue type and reason): weighted fork Grooming: Minimal assistance, Oral care, Wash/dry face Grooming Details (indicate cue type and reason): using wrist support cuff to brush teeth with RUE Upper Body Bathing: Moderate assistance, Sitting Lower Body Bathing: Sitting/lateral leans, Maximal assistance Lower Body Bathing Details (indicate cue type and reason): supported afo/shoe and had pt. practice management of velcro required max a, attempted shoe laces pt. able to do the initial crossing of laces but unable to hold and make the bow portions.  reviewed importance of cont. work with "pinch" exercises with theraputty and foam as precurser to gain strength for this portion of managing shoes/afos Upper Body Dressing : Moderate assistance, Sitting Lower Body Dressing: Moderate assistance, Sitting/lateral leans Toilet Transfer: Minimal assistance Toileting- Clothing Manipulation and Hygiene: Minimal assistance Functional mobility during ADLs: Minimal assistance, Rolling walker (2 wheels) General ADL Comments: session focused on UE exercises and seated ADL   Cognition: Cognition Overall Cognitive Status: Within Functional Limits for tasks assessed Orientation Level: Oriented X4 Cognition Arousal: Alert Behavior During Therapy: WFL for tasks assessed/performed Overall Cognitive Status: Within Functional Limits for tasks assessed General Comments: vebalizing more awareness of understanding about overexertion from previous session/education from other therapists   Blood pressure 121/78, pulse 85, temperature 97.9 F (36.6 C), temperature source  Oral, resp. rate 20, height 5\' 7"  (1.702 m), weight 83.9 kg, SpO2 94%. Physical Exam Constitutional: No apparent distress. Appropriate appearance for age. Laying in bed.  HENT: No JVD. Neck Supple. Trachea midline.  Mild left facial droop.  Eyes: Left lens subluxation.  Right exotropia. EOMI grossly intact.  Center visual field loss in right eye. + Glasses Cardiovascular: RRR, no murmurs/rub/gallops. No Edema. Peripheral pulses 2+  Respiratory: CTAB. No rales, rhonchi, or wheezing. On RA.  Can carry on normal conversation without shortness of breath. Abdomen: + bowel sounds, normoactive. No distention or tenderness.  Skin: C/D/I. No apparent lesions.  Peripheral IVs intact. MSK:      No apparent deformity.        Tight  plantarflexors and wrist flexors bilaterally, can range to neutral        Neurologic exam:  Cognition: AAO to person, place, time and event.  Language: Fluent, No substitutions or neoglisms. No dysarthria. Names 3/3 objects correctly.  Memory: Recalls 3/3 objects at 5 minutes. No apparent deficits  Insight: Good  insight into current condition.  Mood: Pleasant affect, appropriate mood. Anxious. Sensation: To light touch altered in stocking-glove pattern in bilateral lower and upper extremities; especially in the bilateral 4-5th digits palmar area. + Bilateral LE proprioceptive difficulty with 1st toes flexion/extension 3/5 correct     Reflexes: 1+ in BL UE and LEs. Negative Hoffman's and babinski signs bilaterally.  CN: + L facial droop, L V2-3 sensory deficit Coordination: Large amplitude tremors with activation of bilateral deltoids, triceps and biceps. + Difficulty with UE fine motor. + Altered FTN, HTS bilaterally.    Spasticity: MAS 0 in all extremities.; resting plantarflexion and wrist and finger flexor tone        Strength:                RUE: 4/5 SA, 4-/5 EF, 4-/5 EE, 3-/5 WE, 2/5 FF, 3/5 FA                LUE:  4/5 SA, 4-/5 EF, 4-/5 EE, 3-/5 WE, 2/5 FF, 2/5 FA                 RLE: 4/5 HF, 5-/5 KE, 1/5  DF, 1/5  EHL, 4-/5  PF                 LLE:  4/5 HF, 5-/5 KE, 1/5  DF, 1/5  EHL, 4-/5  PF      Lab Results Last 24 Hours       Results for orders placed or performed during the hospital encounter of 10/29/23 (from the past 24 hours)  CBC with Differential/Platelet     Status: Abnormal    Collection Time: 11/06/23  5:10 AM  Result Value Ref Range    WBC 16.8 (H) 4.0 - 10.5 K/uL    RBC 4.50 4.22 - 5.81 MIL/uL    Hemoglobin 13.8 13.0 - 17.0 g/dL    HCT 62.1 30.8 - 65.7 %    MCV 90.7 80.0 - 100.0 fL    MCH 30.7 26.0 - 34.0 pg    MCHC 33.8 30.0 - 36.0 g/dL    RDW 84.6 96.2 - 95.2 %    Platelets 308 150 - 400 K/uL    nRBC 0.0 0.0 - 0.2 %    Neutrophils Relative % 47 %    Neutro Abs 7.9 (H) 1.7 - 7.7 K/uL    Lymphocytes Relative 44 %    Lymphs Abs 7.4 (H) 0.7 - 4.0 K/uL    Monocytes Relative 5 %    Monocytes Absolute 0.8 0.1 - 1.0 K/uL    Eosinophils Relative 4 %    Eosinophils Absolute 0.7 (H) 0.0 - 0.5 K/uL    Basophils Relative 0 %    Basophils Absolute 0.0 0.0 - 0.1 K/uL    nRBC 0 0 /100 WBC    Abs Immature Granulocytes 0.00 0.00 - 0.07 K/uL  Comprehensive metabolic panel     Status: Abnormal    Collection Time: 11/06/23  5:10 AM  Result Value Ref Range    Sodium 138 135 - 145 mmol/L    Potassium 4.2 3.5 - 5.1 mmol/L    Chloride 101 98 - 111 mmol/L  CO2 28 22 - 32 mmol/L    Glucose, Bld 80 70 - 99 mg/dL    BUN 9 6 - 20 mg/dL    Creatinine, Ser 1.61 0.61 - 1.24 mg/dL    Calcium 9.2 8.9 - 09.6 mg/dL    Total Protein 5.6 (L) 6.5 - 8.1 g/dL    Albumin 4.2 3.5 - 5.0 g/dL    AST 20 15 - 41 U/L    ALT 34 0 - 44 U/L    Alkaline Phosphatase 21 (L) 38 - 126 U/L    Total Bilirubin 0.7 0.0 - 1.2 mg/dL    GFR, Estimated >04 >54 mL/min    Anion gap 9 5 - 15  Magnesium     Status: None    Collection Time: 11/06/23  5:10 AM  Result Value Ref Range    Magnesium 2.3 1.7 - 2.4 mg/dL      Imaging Results (Last 48 hours)  No results found.      Assessment/Plan: Diagnosis: Acute exacerbation of CIDP Does the need for close, 24 hr/day medical supervision in concert with the patient's rehab needs make it unreasonable for this patient to be served in a less intensive setting? Yes Co-Morbidities requiring supervision/potential complications: Amxiety, insomnia,  hyponatremia, hypokalemia, hypertension, hypothyroidism, anxiety, and chronic pain with worsening neuropathy. Due to safety, disease management, medication administration, pain management, and patient education, does the patient require 24 hr/day rehab nursing? Yes Does the patient require coordinated care of a physician, rehab nurse, therapy disciplines of PT, OT to address physical and functional deficits in the context of the above medical diagnosis(es)? Yes Addressing deficits in the following areas: balance, endurance, locomotion, strength, transferring, bathing, dressing, feeding, grooming, and toileting Can the patient actively participate in an intensive therapy program of at least 3 hrs of therapy per day at least 5 days per week? Yes The potential for patient to make measurable gains while on inpatient rehab is good Anticipated functional outcomes upon discharge from inpatient rehab are modified independent and supervision  with PT, modified independent and supervision with OT. Estimated rehab length of stay to reach the above functional goals is: 7-10 days Anticipated discharge destination: Home Overall Rehab/Functional Prognosis: good   POST ACUTE RECOMMENDATIONS: This patient's condition is appropriate for continued rehabilitative care in the following setting: CIR Patient has agreed to participate in recommended program. Yes Note that insurance prior authorization may be required for reimbursement for recommended care.   Comment: Mr. Deroos is an excellent candidate for inpatient rehab.  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  He has an excellent dispo plan with a single story home, and wife who can provide support.  He is motivated to participate in inpatient rehab, and is anticipated to make good gains with an intensive therapy program.      MEDICAL RECOMMENDATIONS: Significant panic disorder/anxiety pre-existing now likely exacerbated by steroids and medical decline, consider initiation of Buspar 5 mg TID for symptoms Anxiety interrupting sleep, schedule at bedtime melatonin and consider zolpidem or current clonazepam at bedtime to promote sleep.  Communicated with primary team per patient request regarding worsening sensory loss in his legs; no concerning findings on exam.     I have personally performed a face to face diagnostic evaluation of this patient. Additionally, I have examined the patient's medical record including any pertinent labs and  radiographic images. If the physician assistant has documented in this note, I have reviewed and edited or otherwise concur with the physician assistant's documentation.   Thanks,   [name], DO 11/06/2023          Routing History

## 2023-11-10 NOTE — H&P (Signed)
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 He has had issues with ongoing numbness. He presented to the ED on 10/25/2023 where exam was notable for decreased sensation to bilateral side of face and cheek as well as decrease sensation to bilateral forearms and hands. He performed well on NIF  and was able to ambulate with RW. Discharged with neurology follow-up as outpatient on 1/16. At that visit with Dr. Terrace Arabia, he agreed to admission to the hospital for PLEX and admitted by [facility]. Dr. Amada Jupiter consulted. PLEX ordered for 5 treatment as well as VC and NIF every 12 hours.  Medically stabilized.  Labs stable.  Tolerating diet.  NIF greater than -40 cm of water and incentive spirometry 2500 mL.  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   Past medical history difficult for hypertension, hyperlipidemia, hypothyroidism chronic pain, depression, panic attacks, juvenile cataracts, CAP on 08/28/2023.    Pt reports his hands feel cold and swollen, even though they look and feel normal to touch.  Having some nerve pain- N/T in abd and legs, but hands are also worse than they used to be.    Review of Systems  Constitutional:  Positive for malaise/fatigue.  HENT: Negative.    Eyes: Negative.   Respiratory:  Negative for cough and sputum production.   Cardiovascular: Negative.   Gastrointestinal:  Negative for constipation, diarrhea and nausea.  Genitourinary: Negative.   Musculoskeletal:  Positive for joint pain and myalgias.  Skin:  Negative.   Neurological:  Positive for tingling, sensory change, focal weakness and weakness.  Endo/Heme/Allergies: Negative.   Psychiatric/Behavioral:  The patient is nervous/anxious.   All other systems reviewed and are negative.       Past Medical History:  Diagnosis Date   Allergy     Anxiety     Arthritis     Cataract     Community acquired pneumonia 08/28/2023   Depression     Detached retina     Heart murmur     History of bilateral cataract extraction     Hyperlipidemia     Hypothyroidism     Low back pain     Panic attack     Panic attacks     Pneumonia of right upper lobe due to infectious organism 08/18/2023             Past Surgical History:  Procedure Laterality Date   CATARACT EXTRACTION       EYE SURGERY       IR FLUORO GUIDE CV LINE RIGHT   10/29/2023   IR US GUIDE VASC ACCESS RIGHT   10/29/2023             Family History  Problem Relation Age of Onset   Breast cancer Mother     Hypertension Father     Heart disease Father     Asthma Father     Emphysema Father          smoker for 45 years   Diabetes Brother     Hyperlipidemia Brother  Social History:  reports that he has never smoked. He has been exposed to tobacco smoke. He has never used smokeless tobacco. He reports that he does not currently use alcohol. He reports that he does not use drugs. Allergies:  Allergies       Allergies  Allergen Reactions   Amoxil [Amoxicillin] Other (See Comments)      Pt states that it does not work for him when he had back to back pneumonia   Effexor [Venlafaxine] Diarrhea   Prozac [Fluoxetine Hcl] Diarrhea            Medications Prior to Admission  Medication Sig Dispense Refill   albuterol (VENTOLIN HFA) 108 (90 Base) MCG/ACT inhaler Inhale 1-2 puffs into the lungs every 6 (six) hours as needed for wheezing or shortness of breath. 1 each 0   aspirin EC 81 MG tablet Take 81 mg by mouth daily. Swallow whole.       Cholecalciferol (VITAMIN  D-3) 25 MCG (1000 UT) CAPS Take 1 capsule by mouth daily.       DULoxetine (CYMBALTA) 60 MG capsule Take 1 capsule (60 mg total) by mouth daily. 30 capsule 11   fluticasone (FLONASE) 50 MCG/ACT nasal spray Place 2 sprays into both nostrils daily. 48 g 1   gabapentin (NEURONTIN) 300 MG capsule Take 3 capsules (900 mg total) by mouth 3 (three) times daily. 270 capsule 11   irbesartan (AVAPRO) 300 MG tablet Take 1 tablet (300 mg total) by mouth daily. 30 tablet 0   metoprolol succinate (TOPROL XL) 25 MG 24 hr tablet Take 1 tablet (25 mg total) by mouth daily. 30 tablet 0   oxyCODONE-acetaminophen (PERCOCET/ROXICET) 5-325 MG tablet Take 1 tablet by mouth every 8 (eight) hours as needed for severe pain (pain score 7-10). 90 tablet 0   sertraline (ZOLOFT) 25 MG tablet Take 1 tablet (25 mg total) by mouth daily. 30 tablet 0   tiZANidine (ZANAFLEX) 4 MG tablet Take 1 tablet (4 mg total) by mouth 2 (two) times daily. 60 tablet 0   UNITHROID 125 MCG tablet Take 1 tablet (125 mcg total) by mouth daily before breakfast. (Patient taking differently: Take 125 mcg by mouth at bedtime.) 90 tablet 0   [DISCONTINUED] clonazePAM (KLONOPIN) 0.5 MG tablet Take 1 tablet (0.5 mg total) by mouth 2 (two) times daily as needed for anxiety. 15 tablet 0   Oxcarbazepine (TRILEPTAL) 300 MG tablet Take 1 tablet (300 mg total) by mouth 2 (two) times daily. (Patient not taking: Reported on 10/29/2023) 60 tablet 11   predniSONE (DELTASONE) 10 MG tablet Take 6 tablets (60 mg total) by mouth daily with breakfast. (Patient not taking: Reported on 10/29/2023) 180 tablet 6              Home: Home Living Family/patient expects to be discharged to:: Private residence Living Arrangements: Spouse/significant other Available Help at Discharge: Family, Available PRN/intermittently Type of Home: House Home Access: Stairs to enter Secretary/administrator of Steps: 1 Entrance Stairs-Rails: None Home Layout: One level Bathroom Shower/Tub:  Engineer, manufacturing systems: Standard Bathroom Accessibility: No Home Equipment: Tub bench, Rollator (4 wheels), Grab bars - tub/shower   Functional History: Prior Function Prior Level of Function : Independent/Modified Independent, History of Falls (last six months) Mobility Comments: uses rollator ADLs Comments: was ind up until this admission, spouse has assist for ADLs   Functional Status:  Mobility: Bed Mobility Overal bed mobility: Modified Independent General bed mobility comments: incr time/effort supine to sit to  supine wth HOB flat Transfers Overall transfer level: Needs assistance Equipment used: Rolling walker (2 wheels) Transfers: Sit to/from Stand Sit to Stand: Contact guard assist General transfer comment: CGA for safety, from EOB, able to complete x5 at end of session with focus on eccentric control to sitting with cues for increased trunk flexion Ambulation/Gait Ambulation/Gait assistance: Min assist Gait Distance (Feet): 168 Feet Assistive device: Rolling walker (2 wheels) Gait Pattern/deviations: Step-through pattern, Decreased stride length, Decreased dorsiflexion - right, Decreased dorsiflexion - left, Knee hyperextension - right, Knee hyperextension - left, Steppage General Gait Details: increased UE tremoring throughout session, pt needing x2 standing rest breaks as pt with increased fatigue, min A to steady and maintain balance Gait velocity: decr Gait velocity interpretation: 1.31 - 2.62 ft/sec, indicative of limited community ambulator   ADL: ADL Overall ADL's : Needs assistance/impaired Eating/Feeding: Set up, With adaptive utensils, With assist to don/doff brace/orthosis Eating/Feeding Details (indicate cue type and reason): weighted fork Grooming: Minimal assistance, Oral care, Sitting Grooming Details (indicate cue type and reason): assist for unscrewing small caps Upper Body Bathing: Moderate assistance, Sitting Lower Body Bathing:  Sitting/lateral leans, Maximal assistance Lower Body Bathing Details (indicate cue type and reason): supported afo/shoe and had pt. practice management of velcro required max a, attempted shoe laces pt. able to do the initial crossing of laces but unable to hold and make the bow portions.  reviewed importance of cont. work with "pinch" exercises with theraputty and foam as precurser to gain strength for this portion of managing shoes/afos Upper Body Dressing : Moderate assistance, Sitting Lower Body Dressing: Moderate assistance Lower Body Dressing Details (indicate cue type and reason): able to doff sock in supine with incr time, max A to don Toilet Transfer: Contact guard assist, Ambulation, Rolling walker (2 wheels) Toilet Transfer Details (indicate cue type and reason): simulated from chair at sink to EOB Toileting- Clothing Manipulation and Hygiene: Minimal assistance Functional mobility during ADLs: Contact guard assist, Rolling walker (2 wheels) General ADL Comments: session focused on UE exercises and seated ADL   Cognition: Cognition Overall Cognitive Status: Within Functional Limits for tasks assessed Orientation Level: Oriented X4 Cognition Arousal: Alert Behavior During Therapy: WFL for tasks assessed/performed Overall Cognitive Status: Within Functional Limits for tasks assessed General Comments: vebalizing more awareness of understanding about overexertion from previous session/education from other therapists   Physical Exam: Blood pressure (!) 153/83, pulse 73, temperature 98.2 F (36.8 C), temperature source Oral, resp. rate 18, height 5\' 7"  (1.702 m), weight 81.6 kg, SpO2 98%. Physical Exam Vitals and nursing note reviewed.  Constitutional:      Appearance: Normal appearance. He is obese.     Comments: Pt sitting up in bed; appears pretty comfortable, but sad, NAD  HENT:     Head: Normocephalic and atraumatic.     Right Ear: External ear normal.     Left Ear: External  ear normal.     Nose: Nose normal. No congestion.     Mouth/Throat:     Mouth: Mucous membranes are dry.     Pharynx: Oropharynx is clear. No oropharyngeal exudate.  Eyes:     General:        Right eye: No discharge.        Left eye: No discharge.     Extraocular Movements: Extraocular movements intact.  Cardiovascular:     Rate and Rhythm: Normal rate and regular rhythm.     Heart sounds: Normal heart sounds. No murmur heard.    No  gallop.  Pulmonary:     Effort: Pulmonary effort is normal. No respiratory distress.     Breath sounds: Normal breath sounds. No wheezing, rhonchi or rales.  Abdominal:     General: Bowel sounds are normal. There is no distension.     Palpations: Abdomen is soft.     Tenderness: There is no abdominal tenderness.  Musculoskeletal:        General: Normal range of motion.     Cervical back: Neck supple. No tenderness.     Comments: RUE- biceps/triceps 4/5; WE 2-/5; Grip 3-/5 and FA 0/5 LUE- biceps and triceps 4/5; WE 0/5; Grip 2+/5 and FA 1/5 RLE- HF 4-/5; KE 4/5; KF 4-/5; DF 2/5 and PF 2+/5 LLE- HF 4-/5; KE 4/5; KF 4-/5 DF 1/5 and PF 2-/5 A little flaccid in UEs  Skin:    General: Skin is warm and dry.     Comments: No swelling or temp changes in hands  Neurological:     Mental Status: He is alert.     Comments: Decreased to light touch from elbows to finger tips B/L L>R Some muscle spasms seen in LE's- but not part of UMN syndrome  Psychiatric:     Comments: A little anxious        Lab Results Last 48 Hours        Results for orders placed or performed during the hospital encounter of 10/29/23 (from the past 48 hours)  CBC     Status: Abnormal    Collection Time: 11/09/23  7:03 AM  Result Value Ref Range    WBC 15.7 (H) 4.0 - 10.5 K/uL    RBC 4.47 4.22 - 5.81 MIL/uL    Hemoglobin 13.7 13.0 - 17.0 g/dL    HCT 16.1 09.6 - 04.5 %    MCV 91.1 80.0 - 100.0 fL    MCH 30.6 26.0 - 34.0 pg    MCHC 33.7 30.0 - 36.0 g/dL    RDW 40.9 81.1 - 91.4  %    Platelets 341 150 - 400 K/uL    nRBC 0.0 0.0 - 0.2 %      Comment: Performed at [facility], [address] [place], [place] [address]  Basic metabolic panel     Status: None    Collection Time: 11/09/23  7:03 AM  Result Value Ref Range    Sodium 138 135 - 145 mmol/L    Potassium 4.2 3.5 - 5.1 mmol/L    Chloride 105 98 - 111 mmol/L    CO2 24 22 - 32 mmol/L    Glucose, Bld 77 70 - 99 mg/dL      Comment: Glucose reference range applies only to samples taken after fasting for at least 8 hours.    BUN 12 6 - 20 mg/dL    Creatinine, Ser 6.21 0.61 - 1.24 mg/dL    Calcium 9.1 8.9 - 30.8 mg/dL    GFR, Estimated >65 >78 mL/min      Comment: (NOTE) Calculated using the CKD-EPI Creatinine Equation (2021)      Anion gap 9 5 - 15      Comment: Performed at [facility], [address], [place], [place] [address]  Phosphorus     Status: None    Collection Time: 11/09/23  7:03 AM  Result Value Ref Range    Phosphorus 3.7 2.5 - 4.6 mg/dL      Comment: Performed at [facility], [address], [place], [place] [address]  Magnesium     Status: None    Collection Time: 11/09/23  7:03 AM  Result Value Ref Range    Magnesium 2.2 1.7 - 2.4 mg/dL      Comment: Performed at [facility], [address] [place], [place] [address]      Imaging Results (Last 48 hours)  No results found.         Blood pressure (!) 153/83, pulse 73, temperature 98.2 F (36.8 C), temperature source Oral, resp. rate 18, height 5\' 7"  (1.702 m), weight 81.6 kg, SpO2 98%.   Medical Problem List and Plan: 1. 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             -will be on Prednisone 60 mg daily until f/u with Neurology outpatient             -patient may  shower             -ELOS/Goals: 2-3 weeks- supervision to mod I             Admit to CIR             Estim is Ok'd to try and jumpstart distal Ue's and LE's 2.  Antithrombotics: -DVT/anticoagulation:  Pharmaceutical:  Lovenox             -antiplatelet therapy: aspirin 81 mg daily   3. Pain Management: Tylenol as needed             -continue gabapentin 900 mg TID and 900 mg daily PRN             -continue Percocet 5/325 q 4 hours PRN   4. Mood/Behavior/Sleep: LCSW to evaluate and provide emotional support             -continue Cymbalta 60 mg daily             -continue Zoloft 25 mg daily             -continue clonazepam 0.5 mg BID prn             -antipsychotic agents: n/a   5. Neuropsych/cognition: This patient is capable of making decisions on his own behalf.   6. Skin/Wound Care: Routine skin care checks   7. Fluids/Electrolytes/Nutrition: Routine Is and Os and follow-up chemistries   8: Hypertension: monitor TID and prn             -continue Avapro 300 mg daily             -continue Toprol-XL 25 mg daily   9: Hypothyroidism: continue Synthroid   10: Tobacco use: cessation counseling -Nicorette gum as needed (not using)             -? 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 end date?) -follow-up Dr. Terrace Arabia   12: Leukocytosis: likely steroid induced, improving; no fever              -follow-up CBC with differential Thursday 1/30 and q Monday             Milinda Antis, PA-C 11/10/2023     I have personally performed a face to face diagnostic evaluation of this patient and formulated the key components of the plan.  Additionally, I have personally reviewed laboratory data, imaging studies, as well as relevant notes and concur with the physician assistant's documentation above.   The  patient's status has not changed from the original H&P.  Any changes in documentation from the acute care chart have been noted above.

## 2023-11-10 NOTE — H&P (Signed)
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 He has had issues with ongoing numbness. He presented to the ED on 10/25/2023 where exam was notable for decreased sensation to bilateral side of face and cheek as well as decrease sensation to bilateral forearms and hands. He performed well on NIF  and was able to ambulate with RW. Discharged with neurology follow-up as outpatient on 1/16. At that visit with Dr. Terrace Arabia, he agreed to admission to the hospital for PLEX and admitted by [facility]. Dr. Amada Jupiter consulted. PLEX ordered for 5 treatment as well as VC and NIF every 12 hours.  Medically stabilized.  Labs stable.  Tolerating diet.  NIF greater than -40 cm of water and incentive spirometry 2500 mL.  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  Past medical history difficult for hypertension, hyperlipidemia, hypothyroidism chronic pain, depression, panic attacks, juvenile cataracts, CAP on 08/28/2023.   Pt reports his hands feel cold and swollen, even though they look and feel normal to touch.  Having some nerve pain- N/T in abd and legs, but hands are also worse than they used to be.   Review of Systems  Constitutional:  Positive for malaise/fatigue.  HENT: Negative.    Eyes: Negative.   Respiratory:  Negative for cough and sputum production.   Cardiovascular: Negative.   Gastrointestinal:  Negative for constipation, diarrhea and nausea.  Genitourinary: Negative.   Musculoskeletal:  Positive for joint pain and myalgias.  Skin:  Negative.   Neurological:  Positive for tingling, sensory change, focal weakness and weakness.  Endo/Heme/Allergies: Negative.   Psychiatric/Behavioral:  The patient is nervous/anxious.   All other systems reviewed and are negative.  Past Medical History:  Diagnosis Date   Allergy    Anxiety    Arthritis    Cataract    Community acquired pneumonia 08/28/2023   Depression    Detached retina    Heart murmur    History of bilateral cataract extraction    Hyperlipidemia    Hypothyroidism    Low back pain    Panic attack    Panic attacks    Pneumonia of right upper lobe due to infectious organism 08/18/2023   Past Surgical History:  Procedure Laterality Date   CATARACT EXTRACTION     EYE SURGERY     IR FLUORO GUIDE CV LINE RIGHT  10/29/2023   IR US GUIDE VASC ACCESS RIGHT  10/29/2023   Family History  Problem Relation Age of Onset   Breast cancer Mother    Hypertension Father    Heart disease Father    Asthma Father    Emphysema Father        smoker for 45 years   Diabetes Brother    Hyperlipidemia Brother    Social History:  reports that he has never smoked. He has been exposed to tobacco smoke. He has never used smokeless tobacco. He reports that he does not currently use alcohol. He reports that he does not use drugs. Allergies:  Allergies  Allergen Reactions   Amoxil [Amoxicillin] Other (See Comments)    Pt states that it does not work for  him when he had back to back pneumonia   Effexor [Venlafaxine] Diarrhea   Prozac [Fluoxetine Hcl] Diarrhea   Medications Prior to Admission  Medication Sig Dispense Refill   albuterol (VENTOLIN HFA) 108 (90 Base) MCG/ACT inhaler Inhale 1-2 puffs into the lungs every 6 (six) hours as needed for wheezing or shortness of breath. 1 each 0   aspirin EC 81 MG tablet Take 81 mg by mouth daily. Swallow whole.     Cholecalciferol (VITAMIN D-3) 25 MCG (1000 UT) CAPS Take 1 capsule by mouth daily.     DULoxetine (CYMBALTA) 60 MG capsule  Take 1 capsule (60 mg total) by mouth daily. 30 capsule 11   fluticasone (FLONASE) 50 MCG/ACT nasal spray Place 2 sprays into both nostrils daily. 48 g 1   gabapentin (NEURONTIN) 300 MG capsule Take 3 capsules (900 mg total) by mouth 3 (three) times daily. 270 capsule 11   irbesartan (AVAPRO) 300 MG tablet Take 1 tablet (300 mg total) by mouth daily. 30 tablet 0   metoprolol succinate (TOPROL XL) 25 MG 24 hr tablet Take 1 tablet (25 mg total) by mouth daily. 30 tablet 0   oxyCODONE-acetaminophen (PERCOCET/ROXICET) 5-325 MG tablet Take 1 tablet by mouth every 8 (eight) hours as needed for severe pain (pain score 7-10). 90 tablet 0   sertraline (ZOLOFT) 25 MG tablet Take 1 tablet (25 mg total) by mouth daily. 30 tablet 0   tiZANidine (ZANAFLEX) 4 MG tablet Take 1 tablet (4 mg total) by mouth 2 (two) times daily. 60 tablet 0   UNITHROID 125 MCG tablet Take 1 tablet (125 mcg total) by mouth daily before breakfast. (Patient taking differently: Take 125 mcg by mouth at bedtime.) 90 tablet 0   [DISCONTINUED] clonazePAM (KLONOPIN) 0.5 MG tablet Take 1 tablet (0.5 mg total) by mouth 2 (two) times daily as needed for anxiety. 15 tablet 0   Oxcarbazepine (TRILEPTAL) 300 MG tablet Take 1 tablet (300 mg total) by mouth 2 (two) times daily. (Patient not taking: Reported on 10/29/2023) 60 tablet 11   predniSONE (DELTASONE) 10 MG tablet Take 6 tablets (60 mg total) by mouth daily with breakfast. (Patient not taking: Reported on 10/29/2023) 180 tablet 6      Home: Home Living Family/patient expects to be discharged to:: Private residence Living Arrangements: Spouse/significant other Available Help at Discharge: Family, Available PRN/intermittently Type of Home: House Home Access: Stairs to enter Secretary/administrator of Steps: 1 Entrance Stairs-Rails: None Home Layout: One level Bathroom Shower/Tub: Engineer, manufacturing systems: Standard Bathroom Accessibility: No Home Equipment: Tub bench, Rollator (4  wheels), Grab bars - tub/shower   Functional History: Prior Function Prior Level of Function : Independent/Modified Independent, History of Falls (last six months) Mobility Comments: uses rollator ADLs Comments: was ind up until this admission, spouse has assist for ADLs  Functional Status:  Mobility: Bed Mobility Overal bed mobility: Modified Independent General bed mobility comments: incr time/effort supine to sit to supine wth HOB flat Transfers Overall transfer level: Needs assistance Equipment used: Rolling walker (2 wheels) Transfers: Sit to/from Stand Sit to Stand: Contact guard assist General transfer comment: CGA for safety, from EOB, able to complete x5 at end of session with focus on eccentric control to sitting with cues for increased trunk flexion Ambulation/Gait Ambulation/Gait assistance: Min assist Gait Distance (Feet): 168 Feet Assistive device: Rolling walker (2 wheels) Gait Pattern/deviations: Step-through pattern, Decreased stride length, Decreased dorsiflexion - right, Decreased dorsiflexion - left, Knee hyperextension - right, Knee hyperextension - left,  Steppage General Gait Details: increased UE tremoring throughout session, pt needing x2 standing rest breaks as pt with increased fatigue, min A to steady and maintain balance Gait velocity: decr Gait velocity interpretation: 1.31 - 2.62 ft/sec, indicative of limited community ambulator    ADL: ADL Overall ADL's : Needs assistance/impaired Eating/Feeding: Set up, With adaptive utensils, With assist to don/doff brace/orthosis Eating/Feeding Details (indicate cue type and reason): weighted fork Grooming: Minimal assistance, Oral care, Sitting Grooming Details (indicate cue type and reason): assist for unscrewing small caps Upper Body Bathing: Moderate assistance, Sitting Lower Body Bathing: Sitting/lateral leans, Maximal assistance Lower Body Bathing Details (indicate cue type and reason): supported afo/shoe  and had pt. practice management of velcro required max a, attempted shoe laces pt. able to do the initial crossing of laces but unable to hold and make the bow portions.  reviewed importance of cont. work with "pinch" exercises with theraputty and foam as precurser to gain strength for this portion of managing shoes/afos Upper Body Dressing : Moderate assistance, Sitting Lower Body Dressing: Moderate assistance Lower Body Dressing Details (indicate cue type and reason): able to doff sock in supine with incr time, max A to don Toilet Transfer: Contact guard assist, Ambulation, Rolling walker (2 wheels) Toilet Transfer Details (indicate cue type and reason): simulated from chair at sink to EOB Toileting- Clothing Manipulation and Hygiene: Minimal assistance Functional mobility during ADLs: Contact guard assist, Rolling walker (2 wheels) General ADL Comments: session focused on UE exercises and seated ADL  Cognition: Cognition Overall Cognitive Status: Within Functional Limits for tasks assessed Orientation Level: Oriented X4 Cognition Arousal: Alert Behavior During Therapy: WFL for tasks assessed/performed Overall Cognitive Status: Within Functional Limits for tasks assessed General Comments: vebalizing more awareness of understanding about overexertion from previous session/education from other therapists  Physical Exam: Blood pressure (!) 153/83, pulse 73, temperature 98.2 F (36.8 C), temperature source Oral, resp. rate 18, height 5\' 7"  (1.702 m), weight 81.6 kg, SpO2 98%. Physical Exam Vitals and nursing note reviewed.  Constitutional:      Appearance: Normal appearance. He is obese.     Comments: Pt sitting up in bed; appears pretty comfortable, but sad, NAD  HENT:     Head: Normocephalic and atraumatic.     Right Ear: External ear normal.     Left Ear: External ear normal.     Nose: Nose normal. No congestion.     Mouth/Throat:     Mouth: Mucous membranes are dry.     Pharynx:  Oropharynx is clear. No oropharyngeal exudate.  Eyes:     General:        Right eye: No discharge.        Left eye: No discharge.     Extraocular Movements: Extraocular movements intact.  Cardiovascular:     Rate and Rhythm: Normal rate and regular rhythm.     Heart sounds: Normal heart sounds. No murmur heard.    No gallop.  Pulmonary:     Effort: Pulmonary effort is normal. No respiratory distress.     Breath sounds: Normal breath sounds. No wheezing, rhonchi or rales.  Abdominal:     General: Bowel sounds are normal. There is no distension.     Palpations: Abdomen is soft.     Tenderness: There is no abdominal tenderness.  Musculoskeletal:        General: Normal range of motion.     Cervical back: Neck supple. No tenderness.     Comments: RUE- biceps/triceps 4/5; WE  2-/5; Grip 3-/5 and FA 0/5 LUE- biceps and triceps 4/5; WE 0/5; Grip 2+/5 and FA 1/5 RLE- HF 4-/5; KE 4/5; KF 4-/5; DF 2/5 and PF 2+/5 LLE- HF 4-/5; KE 4/5; KF 4-/5 DF 1/5 and PF 2-/5 A little flaccid in UEs  Skin:    General: Skin is warm and dry.     Comments: No swelling or temp changes in hands  Neurological:     Mental Status: He is alert.     Comments: Decreased to light touch from elbows to finger tips B/L L>R Some muscle spasms seen in LE's- but not part of UMN syndrome  Psychiatric:     Comments: A little anxious     Results for orders placed or performed during the hospital encounter of 10/29/23 (from the past 48 hours)  CBC     Status: Abnormal   Collection Time: 11/09/23  7:03 AM  Result Value Ref Range   WBC 15.7 (H) 4.0 - 10.5 K/uL   RBC 4.47 4.22 - 5.81 MIL/uL   Hemoglobin 13.7 13.0 - 17.0 g/dL   HCT 16.1 09.6 - 04.5 %   MCV 91.1 80.0 - 100.0 fL   MCH 30.6 26.0 - 34.0 pg   MCHC 33.7 30.0 - 36.0 g/dL   RDW 40.9 81.1 - 91.4 %   Platelets 341 150 - 400 K/uL   nRBC 0.0 0.0 - 0.2 %    Comment: Performed at [facility], [address] [place], [place] [address]  Basic metabolic  panel     Status: None   Collection Time: 11/09/23  7:03 AM  Result Value Ref Range   Sodium 138 135 - 145 mmol/L   Potassium 4.2 3.5 - 5.1 mmol/L   Chloride 105 98 - 111 mmol/L   CO2 24 22 - 32 mmol/L   Glucose, Bld 77 70 - 99 mg/dL    Comment: Glucose reference range applies only to samples taken after fasting for at least 8 hours.   BUN 12 6 - 20 mg/dL   Creatinine, Ser 6.21 0.61 - 1.24 mg/dL   Calcium 9.1 8.9 - 30.8 mg/dL   GFR, Estimated >65 >78 mL/min    Comment: (NOTE) Calculated using the CKD-EPI Creatinine Equation (2021)    Anion gap 9 5 - 15    Comment: Performed at [facility], [address], [place], [place] [address]  Phosphorus     Status: None   Collection Time: 11/09/23  7:03 AM  Result Value Ref Range   Phosphorus 3.7 2.5 - 4.6 mg/dL    Comment: Performed at [facility], [address], [place], [place] [address]  Magnesium     Status: None   Collection Time: 11/09/23  7:03 AM  Result Value Ref Range   Magnesium 2.2 1.7 - 2.4 mg/dL    Comment: Performed at [facility], [address], [place], [place] [address]   No results found.    Blood pressure (!) 153/83, pulse 73, temperature 98.2 F (36.8 C), temperature source Oral, resp. rate 18, height 5\' 7"  (1.702 m), weight 81.6 kg, SpO2 98%.  Medical Problem List and Plan: 1. 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  -will be on Prednisone 60 mg daily until f/u with Neurology outpatient  -patient may  shower  -ELOS/Goals: 2-3 weeks- supervision to mod I  Admit to CIR  Estim is Ok'd to try and jumpstart distal Ue's and LE's 2.  Antithrombotics: -DVT/anticoagulation:  Pharmaceutical: Lovenox  -antiplatelet therapy: aspirin 81 mg daily  3. Pain Management: Tylenol as needed  -continue gabapentin 900 mg TID and 900 mg daily PRN  -continue Percocet 5/325 q 4 hours PRN  4. Mood/Behavior/Sleep: LCSW to evaluate and provide emotional support  -continue Cymbalta 60 mg  daily  -continue Zoloft 25 mg daily  -continue clonazepam 0.5 mg BID prn  -antipsychotic agents: n/a  5. Neuropsych/cognition: This patient is capable of making decisions on his own behalf.  6. Skin/Wound Care: Routine skin care checks   7. Fluids/Electrolytes/Nutrition: Routine Is and Os and follow-up chemistries  8: Hypertension: monitor TID and prn  -continue Avapro 300 mg daily  -continue Toprol-XL 25 mg daily  9: Hypothyroidism: continue Synthroid  10: Tobacco use: cessation counseling -Nicorette gum as needed (not using)  -? cough>> on scheduled Mucinex-DM BID  11: CIPD -continue prednisone 40 mg daily -daily VC and NIF (? end date?) -follow-up Dr. Terrace Arabia  12: Leukocytosis: likely steroid induced, improving; no fever   -follow-up CBC with differential Thursday 1/30 and q Monday       Milinda Antis, PA-C 11/10/2023   I have personally performed a face to face diagnostic evaluation of this patient and formulated the key components of the plan.  Additionally, I have personally reviewed laboratory data, imaging studies, as well as relevant notes and concur with the physician assistant's documentation above.   The patient's status has not changed from the original H&P.  Any changes in documentation from the acute care chart have been noted above.

## 2023-11-11 ENCOUNTER — Ambulatory Visit: Payer: 59 | Admitting: Physical Therapy

## 2023-11-11 ENCOUNTER — Other Ambulatory Visit: Payer: 59

## 2023-11-11 ENCOUNTER — Ambulatory Visit: Payer: 59 | Admitting: Occupational Therapy

## 2023-11-11 DIAGNOSIS — I10 Essential (primary) hypertension: Secondary | ICD-10-CM | POA: Diagnosis not present

## 2023-11-11 DIAGNOSIS — E039 Hypothyroidism, unspecified: Secondary | ICD-10-CM

## 2023-11-11 DIAGNOSIS — D72829 Elevated white blood cell count, unspecified: Secondary | ICD-10-CM | POA: Diagnosis not present

## 2023-11-11 DIAGNOSIS — G6181 Chronic inflammatory demyelinating polyneuritis: Secondary | ICD-10-CM | POA: Diagnosis not present

## 2023-11-11 NOTE — Discharge Summary (Signed)
 Physician Discharge Summary  Patient ID: Jonathan Luna MRN: [id number] DOB/AGE: 58/06/1966 58 y.o.  Admit date: 11/10/2023 Discharge date: 12/08/2023  Discharge Diagnoses:  Principal Problem:   CIDP (chronic inflammatory demyelinating polyneuropathy) (HCC) Active Problems:   Anxiety state Active problems:   CIDP (chronic inflammatory demyelinating polyneuropathy) (HCC) Hypertension Hypothyroidism Tobacco use Leukocytosis Anxiety disorder Concern for treatment refractory CIDP   Discharged Condition: Stable  Significant Diagnostic Studies: Narrative & Impression  CLINICAL DATA:  Follow-up right upper lobe infiltrate   EXAM: CT CHEST WITHOUT CONTRAST   TECHNIQUE: Multidetector CT imaging of the chest was performed following the standard protocol without IV contrast.   RADIATION DOSE REDUCTION: This exam was performed according to the departmental dose-optimization program which includes automated exposure control, adjustment of the mA and/or kV according to patient size and/or use of iterative reconstruction technique.   COMPARISON:  CT from 09/30/2023 and 08/28/2023.   FINDINGS: Cardiovascular: Thoracic aorta shows no aneurysmal dilatation. No significant atherosclerotic calcifications are seen. Mild coronary calcifications are noted. No cardiomegaly is seen.   Mediastinum/Nodes: Thoracic inlet is within normal limits. No hilar or mediastinal adenopathy is noted. The esophagus as visualized is within normal limits.   Lungs/Pleura: The lungs are well aerated bilaterally. No focal confluent infiltrate or sizable effusion is seen. The previously noted changes in the right upper lobe posteriorly have continue to improved with only minimal residual scarring identified. No focal nodularity is seen. A few scattered calcified granulomas are noted.   Upper Abdomen: Visualized upper abdomen is within normal limits.   Musculoskeletal: No chest wall mass or suspicious bone  lesions identified.   IMPRESSION: Continued resolution of the right upper lobe image is seen on prior examinations. Only minimal residual scarring remains. No focal nodule or mass is seen.   No other focal abnormality is noted.     Electronically Signed   By: Alcide Clever M.D.   On: 11/27/2023 21:26       Labs:  Basic Metabolic Panel: Recent Labs  Lab 12/07/23 0616  NA 136  K 4.0  CL 102  CO2 26  GLUCOSE 81  BUN 12  CREATININE 0.79  CALCIUM 8.9    CBC: Recent Labs  Lab 12/07/23 0616  WBC 12.2*  HGB 14.4  HCT 43.1  MCV 92.5  PLT 345     Brief HPI:   Jonathan Luna is a 58 y.o. male was discharged from CIR on 10/20/2023 due to acute inflammatory demyelinating polyneuropathy treated with 5 day course of IVIG. He has had issues with ongoing numbness. He presented to the ED on 10/25/2023 where exam was notable for decreased sensation to bilateral side of face and cheek as well as decrease sensation to bilateral forearms and hands. He performed well on NIF and was able to ambulate with RW. Discharged with neurology follow-up as outpatient on 1/16. At that visit with Dr. Terrace Arabia, he agreed to admission to the hospital for PLEX and admitted by [facility]. Dr. Amada Jupiter consulted. PLEX ordered for 5 treatment as well as VC and NIF every 12 hours. Medically stabilized. Labs stable. Tolerating diet. NIF greater than -40 cm of water and incentive spirometry 2500 mL. Requiring min assist for basic self-care skills and contact-guard assist for mobility.   Hospital Course: Jonathan Luna was admitted to rehab 11/10/2023 for inpatient therapies to consist of PT, ST and OT at least three hours five days a week. Past admission physiatrist, therapy team and rehab RN have worked together to provide  customized collaborative inpatient rehab. Continued with complaints of side-effects of prednisone. Explained importance of medication. Increased clonazepam to 1 mg BID as needed. Mildly elevated ALT>>monitored and  normalized on subsequent labs. Bilateral resting hand splints placed. E-stim used for radial nerve/UE function. Reported leg weakness worse on 2/04. Consulted Dr Terrace Arabia- she wanted another dosing of IVIG over a total of 4 days- 2g/kg total dose over 4 days. Complaints of cold and wet sensation in both feet continued. No improvement with amlodipine. Cymbalta increased and Zoloft discontinued on 2/06. Neuropsychology eval on 2/08. Energy level improved after IVIG, but no improvement in strength. Anxiety about recovery persists. Consulted neurology, Dr. Terrace Arabia and will plan further IGIV after discharge.  Reconsulted neurology on 2/13: exam revealed inability to feel temperature below knee bilaterally. Vibration decreased on knees and hands bilaterally. Sensation intact to light touch throughout No hemineglect, no extinction to double sided stimulation (visual & tactile). Discussed with Dr. Terrace Arabia with Neuromuscular team and concern for treatment refractory CIDP. Recommended maintenance IVIG 1G/Kg every other week. Continue prednisone 60 mg once daily. Acute hepatitis screen, Quantiferon gold plus. Hold off on rituximab given noted RUL lesion on CXR from dec 2024. Will hold off until quantiferon results. CT chest performed to evaluate: continued resolution of RUL lung lesion. Acute hepatitis panel negative. IVIG given 2/14 and 2/15. Ongoing severe back pain continued Percocet and as needed gabapentin. Improved with IVIG and rituximab deferred. Neurology follow-up on 2/18: patient feels that his symptoms have plateaued and he has not improved much with the additional dose of IVIG. Dr Wilford Corner thinks it is prudent to give him a Rituxan infusion while inpatient and called lab regarding quantaferon results. Results equivocal and new order placed to repeat.      Blood pressures were monitored on TID basis and Avapro 300 mg daily and Toprol-XL 25 mg daily continued. Reduced Avapro to 75 mg daily on 2/02 and started amlodipine 5 mg  daily. Reduced amlodipine to 2.5 mg daily on 2/09. Increased amlodipine back to 5 mg on 2/15.    Rehab course: During patient's stay in rehab weekly team conferences were held to monitor patient's progress, set goals and discuss barriers to discharge. At admission, patient required max with basic self-care skills   Physical exam.  Blood pressure 153/83 pulse 73 temperature 98.2 respirations 18 oxygen saturation is 98% room air Constitutional.  No acute distress HEENT Head.  Normocephalic and atraumatic Eyes.  Pupils round and reactive to light no discharge without nystagmus Neck.  Supple nontender no JVD without thyromegaly Cardiac regular rate and rhythm without any extra sounds or murmur heard Abdomen.  Soft nontender positive bowel sounds without rebound Respiratory effort normal no respiratory distress without wheeze Musculoskeletal Comments.  Right upper extremity biceps/triceps 4/5 wrist extension to minus/5 grip 3 -/5 and FA 0/5 Left upper extremity biceps and triceps 4/5W EE 0/5 grip 2+/5 and FA 1/5 Right lower extremity hip flexors 4 -/5 KE 4/5 KF 4 -/5 DF 2/5 and PF 2+/5 Left lower extremity hip flexors 4 -/5 KE 4/5 KF 4 -/5 DF 1/5 and PF 2 -/5  He has had improvement in activity tolerance, balance, postural control as well as ability to compensate for deficits. He has had improvement in functional use RUE/LUE  and RLE/LLE as well as improvement in awareness. Walked 135 ft with HHA Min A on 2/16.  Family teaching completed and patient was discharged to home.       Disposition:  Discharge disposition: 06-Home-Health Care Svc  Diet: Regular  Special Instructions: No driving smoking or alcohol  Medications at discharge 1.  Tylenol as needed 2.  Norvasc 5 mg p.o. daily 3.  Aspirin 81 mg p.o. daily 4.  Klonopin 1 mg p.o. twice daily as needed anxiety 5.  Cymbalta 120 mg p.o. daily 6.  Neurontin 900 mg p.o. 3 times daily 7.  Unithroid 125 mcg p.o. daily 8.   Toprol-XL 25 mg p.o. daily 9.  Nicorette gum 2 mg as needed 10.  Oxycodone 1 tablet every 4 hours as needed pain 11.  Prednisone 60 mg p.o. daily 12.  Bactrim 1 tablet daily 13.  Albuterol inhaler 1 to 2 puffs every 6 hours as needed shortness of breath 14.  Benadryl 25 mg every 6 hours as needed itching 15.  Flonase 2 sprays each nostril daily 16.  Robitussin DM 5 mL every 4 hours as needed cough 17.  MiraLAX daily as needed 18.  Vitamin D 1 tablet daily   30-35 minutes were spent completing discharge summary and discharge planning     Follow-up Information     Lovorn, Aundra Millet, MD Follow up.   Specialty: Physical Medicine and Rehabilitation Why: office will call you to arrange your appt (sent) Contact information: [address] [place] [address] [phone]         [name], MD Follow up.   Specialty: Internal Medicine Why: Call the office on [place] to make arrangements for hospital follow-up. Discuss cardiac CT scan for follow-up/plans. Contact information: [address] [place] [address] [phone]         Levert Feinstein, MD Follow up.   Specialty: Neurology Why: Call the office on Monday to make arrangements for hospital follow-up appointment Contact information: [address] [place] [address] [phone]                 Signed: Charlton Amor, PA-C 12/08/2023, 5:04 AM

## 2023-11-11 NOTE — Progress Notes (Signed)
Patient was concerned about feet feeling cold. Nursed assessed feet warm to the touch. Cap refill <3 seconds. Pedal pulse +2. Legs bilateral cool to the touch. BP assessed from tibia WNL. Patient reassured and resting comfortably.

## 2023-11-11 NOTE — Progress Notes (Addendum)
PROGRESS NOTE   Subjective/Complaints: Working with therapy this morning.  Asked about his arm, feels like it is making him a little anxious.  Reports pain is overall under control.  Continues to have altered sensation in his arms and hands.  ROS: Patient denies fever, new vision changes, dizziness, nausea, vomiting, diarrhea,  shortness of breath or chest pain, headache.  + anxious    Objective:   No results found. Recent Labs    11/09/23 0703  WBC 15.7*  HGB 13.7  HCT 40.7  PLT 341   Recent Labs    11/09/23 0703  NA 138  K 4.2  CL 105  CO2 24  GLUCOSE 77  BUN 12  CREATININE 0.74  CALCIUM 9.1    Intake/Output Summary (Last 24 hours) at 11/11/2023 1525 Last data filed at 11/11/2023 1521 Gross per 24 hour  Intake 956 ml  Output 1300 ml  Net -344 ml        Physical Exam: Vital Signs Blood pressure 137/86, pulse 71, temperature 98 F (36.7 C), temperature source Oral, resp. rate 18, height 5\' 7"  (1.702 m), weight 85.9 kg, SpO2 99%.   General:  No apparent distress, + obese HEENT: Head is normocephalic, atraumatic, mucous membranes dry Neck: Supple without JVD or lymphadenopathy Heart: Reg rate and rhythm. No murmurs rubs or gallops Chest: CTA bilaterally without wheezes, rales, or rhonchi; no distress Abdomen: Soft, non-tender, non-distended, bowel sounds positive. Extremities: No clubbing, cyanosis, or edema. Pulses are 2+ Psych: Pt's affect is appropriate. Pt is cooperative Skin: Clean and intact without signs of breakdown Neuro:  Decreased to light touch from elbows to finger tips B/L L>R Some muscle spasms seen in LE's- but not part of UMN syndrome - not seen today 1/29 Musculoskeletal:  General: Normal range of motion.     Cervical back: Neck supple. No tenderness.     Comments: RUE- biceps/triceps 4/5; WE 2-/5; Grip 3-/5 and FA 0/5 LUE- biceps and triceps 4/5; WE 0/5; Grip 2+/5 and FA 1/5 RLE-  HF 4-/5; KE 4/5; KF 4-/5; DF 2/5 and PF 2+/5 LLE- HF 4-/5; KE 4/5; KF 4-/5 DF 1/5 and PF 2-/5 A little flaccid in UEs    Assessment/Plan: 1. Functional deficits which require 3+ hours per day of interdisciplinary therapy in a comprehensive inpatient rehab setting. Physiatrist is providing close team supervision and 24 hour management of active medical problems listed below. Physiatrist and rehab team continue to assess barriers to discharge/monitor patient progress toward functional and medical goals  Care Tool:  Bathing    Body parts bathed by patient: Right arm, Left lower leg, Face, Left arm, Chest, Abdomen, Front perineal area, Buttocks, Right upper leg, Left upper leg, Right lower leg         Bathing assist Assist Level: Supervision/Verbal cueing     Upper Body Dressing/Undressing Upper body dressing   What is the patient wearing?: Pull over shirt    Upper body assist Assist Level: Moderate Assistance - Patient 50 - 74%    Lower Body Dressing/Undressing Lower body dressing      What is the patient wearing?: Underwear/pull up, Pants     Lower body assist Assist  for lower body dressing: Maximal Assistance - Patient 25 - 49%     Toileting Toileting    Toileting assist Assist for toileting: Moderate Assistance - Patient 50 - 74%     Transfers Chair/bed transfer  Transfers assist     Chair/bed transfer assist level: Minimal Assistance - Patient > 75% Chair/bed transfer assistive device: Walker, Archivist   Ambulation assist      Assist level: Contact Guard/Touching assist Assistive device: Walker-rolling (bilateral hand splints) Max distance: 80   Walk 10 feet activity   Assist     Assist level: Contact Guard/Touching assist Assistive device: Walker-rolling   Walk 50 feet activity   Assist    Assist level: Minimal Assistance - Patient > 75% Assistive device: Walker-rolling    Walk 150 feet activity   Assist  Walk 150 feet activity did not occur:  (fatigue and imbalance)         Walk 10 feet on uneven surface  activity   Assist Walk 10 feet on uneven surfaces activity did not occur:  (fatigue and imbalance)         Wheelchair     Assist Is the patient using a wheelchair?: Yes Type of Wheelchair: Manual Wheelchair activity did not occur: Safety/medical concerns         Wheelchair 50 feet with 2 turns activity    Assist    Wheelchair 50 feet with 2 turns activity did not occur:  (fatigue and imbalance)       Wheelchair 150 feet activity     Assist  Wheelchair 150 feet activity did not occur:  (fatigue and imbalance)       Blood pressure 137/86, pulse 71, temperature 98 F (36.7 C), temperature source Oral, resp. rate 18, height 5\' 7"  (1.702 m), weight 85.9 kg, SpO2 99%.  Medical Problem List and Plan: 1. 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  Antithrombotics: -DVT/anticoagulation:  Pharmaceutical: Lovenox             -antiplatelet therapy: aspirin 81 mg daily   3. Pain Management: Tylenol as needed             -continue gabapentin 900 mg TID and 900 mg daily PRN             -continue Percocet 5/325 q 4 hours PRN   4. Mood/Behavior/Sleep: LCSW to evaluate and provide emotional support             -continue Cymbalta 60 mg daily             -continue Zoloft 25 mg daily             -continue clonazepam 0.5 mg BID prn             -antipsychotic agents: n/a   5. Neuropsych/cognition: This patient is capable of making decisions on his own behalf.  6. Skin/Wound Care: Routine skin care checks   7.  Fluids/Electrolytes/Nutrition: Routine Is and Os and follow-up chemistries   8: Hypertension: monitor TID and prn             -continue Avapro 300 mg daily             -continue Toprol-XL 25 mg daily  -1/29 BP stable continue to monitor     11/11/2023    4:28 AM 11/10/2023    8:03 PM 11/10/2023    1:45 PM  Vitals with BMI  Height   5\' 7"   Weight   189 lbs 6 oz  BMI   29.65  Systolic 137 106 409  Diastolic 86 70 76  Pulse 71 73 86      9: Hypothyroidism: continue Synthroid 125 mcg daily   10: Tobacco use: cessation counseling -Nicorette gum as needed (not using)             -? 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 end date?) -follow-up Dr. Terrace Arabia -Continue to monitor respiratory function   12: Leukocytosis: likely steroid induced, improving; no fever              -follow-up CBC with differential Thursday 1/30 and q Monday  1/29 WBC down to 15.7, continue to monitor    LOS: 1 days A FACE TO FACE EVALUATION WAS PERFORMED  Fanny Dance 11/11/2023, 3:25 PM

## 2023-11-11 NOTE — Evaluation (Signed)
Occupational Therapy Assessment and Plan  Patient Details  Name: HAIK MAHONEY MRN: [id number] Date of Birth: [birth year]  OT Diagnosis: ataxia, lumbago (low back pain), and muscle weakness (generalized) Rehab Potential: Rehab Potential (ACUTE ONLY): Good ELOS: 14-16 days   Today's Date: 11/11/2023 OT Individual Time: 4098-1191 OT Individual Time Calculation (min): 80 min     Hospital Problem: Principal Problem:   CIDP (chronic inflammatory demyelinating polyneuropathy) (HCC)   Past Medical History:  Past Medical History:  Diagnosis Date   Allergy    Anxiety    Arthritis    Cataract    Community acquired pneumonia 08/28/2023   Depression    Detached retina    Heart murmur    History of bilateral cataract extraction    Hyperlipidemia    Hypothyroidism    Low back pain    Panic attack    Panic attacks    Pneumonia of right upper lobe due to infectious organism 08/18/2023   Past Surgical History:  Past Surgical History:  Procedure Laterality Date   CATARACT EXTRACTION     EYE SURGERY     IR FLUORO GUIDE CV LINE RIGHT  10/29/2023   IR US GUIDE VASC ACCESS RIGHT  10/29/2023    Assessment & Plan Clinical Impression: Merl Bommarito is a 58 year old R handed male who was discharged from CIR on 10/20/2023 due to acute inflammatory demyelinating polyneuropathy treated with 5 day course of IVIG. He has had issues with ongoing numbness. He presented to the ED on 10/25/2023 where exam was notable for decreased sensation to bilateral side of face and cheek as well as decrease sensation to bilateral forearms and hands. He performed well on NIF and was able to ambulate with RW. Discharged with neurology follow-up as outpatient on 1/16. At that visit with Dr. Terrace Arabia, he agreed to admission to the hospital for PLEX and admitted by [facility]. Dr. Amada Jupiter consulted. PLEX ordered for 5 treatment as well as VC and NIF every 12 hours. Medically stabilized. Labs stable. Tolerating diet. NIF greater than -40 cm of  water and incentive spirometry 2500 mL. 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 .  Patient transferred to CIR on 11/10/2023 .    Patient currently requires max with basic self-care skills secondary to muscle weakness, decreased cardiorespiratoy endurance, impaired timing and sequencing, abnormal tone, unbalanced muscle activation, ataxia, and decreased coordination, and decreased standing balance, decreased postural control, and decreased balance strategies.  Prior to hospitalization, patient could complete ADLs with modified independent .  Patient will benefit from skilled intervention to decrease level of assist with basic self-care skills and increase independence with basic self-care skills prior to discharge home with care partner.  Anticipate patient will require intermittent supervision and follow up outpatient.  OT - End of Session Activity Tolerance: Tolerates 30+ min activity with multiple rests Endurance Deficit: Yes Endurance Deficit Description: decreased OT Assessment Rehab Potential (ACUTE ONLY): Good OT Barriers to Discharge: Decreased caregiver support OT Patient demonstrates impairments in the following area(s): Balance;Safety;Sensory;Skin Integrity;Endurance;Motor;Pain OT Basic ADL's Functional Problem(s): Eating;Grooming;Bathing;Dressing;Toileting OT Transfers Functional Problem(s): Toilet;Tub/Shower OT Additional Impairment(s): None OT Plan OT Intensity: Minimum of 1-2 x/day, 45 to 90 minutes OT Frequency: 5 out of 7 days OT Duration/Estimated Length of Stay: 14-16 days OT Treatment/Interventions: Balance/vestibular training;Disease mangement/prevention;Neuromuscular re-education;Self Care/advanced ADL retraining;Therapeutic Exercise;DME/adaptive  equipment instruction;Pain management;UE/LE Strength taining/ROM;Community  reintegration;Patient/family education;UE/LE Coordination activities;Discharge planning;Functional mobility training;Psychosocial support;Therapeutic Activities;Visual/perceptual remediation/compensation;Skin care/wound managment;Splinting/orthotics;Functional electrical stimulation;Wheelchair propulsion/positioning OT Self Feeding Anticipated Outcome(s): mod I OT Basic Self-Care Anticipated Outcome(s): mod I OT Toileting Anticipated Outcome(s): mod I OT Bathroom Transfers Anticipated Outcome(s): mod I OT Recommendation Recommendations for Other Services: Therapeutic Recreation consult;Neuropsych consult Therapeutic Recreation Interventions: Pet therapy;Stress management Patient destination: Home Follow Up Recommendations: Outpatient OT Equipment Recommended: To be determined Equipment Details: owns rollator, TTB   OT Evaluation Precautions/Restrictions  Precautions Precautions: Fall Restrictions Weight Bearing Restrictions Per Provider Order: No General Chart Reviewed: Yes Response to Previous Treatment: Not applicable Family/Caregiver Present: No Pain Assessment Pain Scale: 0-10 Pain Score: 7  Pain Type: Acute pain Pain Location: Back Pain Orientation: Lower Pain Onset: On-going Pain Intervention(s): Medication (See eMAR);Shower;Repositioned;Ambulation/increased activity;Distraction;Hot/Cold interventions;Relaxation;Emotional support Home Living/Prior Functioning Home Living Family/patient expects to be discharged to:: Private residence Living Arrangements: Spouse/significant other Available Help at Discharge: Family, Available PRN/intermittently Type of Home: House Home Access: Stairs to enter Secretary/administrator of Steps: 1 Entrance Stairs-Rails: None Home Layout: One level Bathroom Shower/Tub: Armed forces operational officer Accessibility: No Additional Comments: owns  TTB, rollator, RW  Lives With: Spouse IADL History Homemaking Responsibilities: Yes Meal Prep Responsibility: Secondary Laundry Responsibility: Secondary Cleaning Responsibility: Secondary Shopping Responsibility: Primary Child Care Responsibility: No Current License: Yes Mode of Transportation: Other (comment) ([employer]) Occupation: Full time employment Type of Occupation: Retail buyer Leisure and Hobbies: works out Prior Function Level of Independence: Independent with basic ADLs, Independent with homemaking with ambulation, Independent with gait, Independent with transfers  Able to Take Stairs?: Yes Driving: Yes Vocation: Full time employment Leisure: Hobbies-yes (Comment) (watching sports, weightlifting) Vision Baseline Vision/History: 1 Wears glasses Ability to See in Adequate Light: 0 Adequate Patient Visual Report: No change from baseline Vision Assessment?: No apparent visual deficits;Wears glasses for reading;Wears glasses for driving Additional Comments: has been wearing since child Perception  Perception: Within Functional Limits Praxis Praxis: WFL Cognition Cognition Overall Cognitive Status: Within Functional Limits for tasks assessed Arousal/Alertness: Awake/alert Orientation Level: Person;Place;Situation Person: Oriented Place: Oriented Situation: Oriented Memory: Appears intact Awareness: Appears intact Problem Solving: Appears intact Safety/Judgment: Appears intact Brief Interview for Mental Status (BIMS) Repetition of Three Words (First Attempt): 3 Temporal Orientation: Year: Correct Temporal Orientation: Month: Accurate within 5 days Temporal Orientation: Day: Correct Recall: "Sock": Yes, no cue required Recall: "Blue": Yes, no cue required Recall: "Bed": Yes, no cue required BIMS Summary Score: 15 Sensation Sensation Light Touch: Impaired Detail Peripheral sensation comments: decreased LE sensation globally, decreased sensation on palmar  surface of hand Light Touch Impaired Details: Impaired RUE;Impaired LLE;Impaired LUE;Impaired RLE Hot/Cold: Appears Intact Proprioception: Appears Intact Stereognosis: Not tested Additional Comments: numbness in BLE and stomach and Lt side of face from chin to forehead and Rt side of lips Coordination Gross Motor Movements are Fluid and Coordinated: No Fine Motor Movements are Fluid and Coordinated: No Coordination and Movement Description: weakness and ataxia, Lt worse than Rt Finger Nose Finger Test: dysmetria, intention tremor Heel Shin Test: WFL (pt states it is challenging) Motor  Motor Motor: Ataxia Motor - Skilled Clinical Observations: ataxia worse on Lt than Rt  Trunk/Postural Assessment  Cervical Assessment Cervical Assessment: Within Functional Limits Thoracic Assessment Thoracic Assessment: Within Functional Limits Lumbar Assessment Lumbar Assessment: Within Functional Limits Postural Control Postural Control: Within Functional Limits Trunk Control: ataxic  Balance Balance Balance Assessed: Yes Static Sitting Balance Static Sitting - Balance Support: Feet supported Static Sitting - Level of Assistance: 6: Modified independent (  Device/Increase time) Dynamic Sitting Balance Dynamic Sitting - Balance Support: Feet supported;During functional activity Dynamic Sitting - Level of Assistance: 5: Stand by assistance Dynamic Sitting - Balance Activities: Reaching for objects;Forward lean/weight shifting Sitting balance - Comments: sits unsupported EOB Static Standing Balance Static Standing - Balance Support: Bilateral upper extremity supported;During functional activity Static Standing - Level of Assistance: 4: Min assist Dynamic Standing Balance Dynamic Standing - Balance Support: During functional activity;Bilateral upper extremity supported Dynamic Standing - Level of Assistance:  (CGA) Dynamic Standing - Balance Activities: Reaching for objects;Forward lean/weight  shifting;Reaching across midline Extremity/Trunk Assessment RUE Assessment RUE Assessment: Exceptions to [facility] Passive Range of Motion (PROM) Comments: WFL Active Range of Motion (AROM) Comments: WFL proximal, wrist drop General Strength Comments: trace wrist movement, 4-/5 grip, 3+/5 proximal joints LUE Assessment Active Range of Motion (AROM) Comments: WFL proximal, wrist drop General Strength Comments: muscle twitch in forearm for wrist movement, 4-/5 grip, 3+/5 proximal joints  Care Tool Care Tool Self Care Eating   Eating Assist Level: Set up assist    Oral Care    Oral Care Assist Level: Minimal Assistance - Patient > 75%    Bathing   Body parts bathed by patient: Right arm;Left lower leg;Face;Left arm;Chest;Abdomen;Front perineal area;Buttocks;Right upper leg;Left upper leg;Right lower leg     Assist Level: Supervision/Verbal cueing    Upper Body Dressing(including orthotics)   What is the patient wearing?: Pull over shirt   Assist Level: Moderate Assistance - Patient 50 - 74%    Lower Body Dressing (excluding footwear)   What is the patient wearing?: Underwear/pull up;Pants Assist for lower body dressing: Maximal Assistance - Patient 25 - 49%    Putting on/Taking off footwear   What is the patient wearing?: Socks;Shoes;AFO Assist for footwear: Dependent - Patient 0%       Care Tool Toileting Toileting activity   Assist for toileting: Moderate Assistance - Patient 50 - 74%     Care Tool Bed Mobility Roll left and right activity   Roll left and right assist level: Supervision/Verbal cueing    Sit to lying activity   Sit to lying assist level: Supervision/Verbal cueing    Lying to sitting on side of bed activity   Lying to sitting on side of bed assist level: the ability to move from lying on the back to sitting on the side of the bed with no back support.: Contact Guard/Touching assist     Care Tool Transfers Sit to stand transfer   Sit to stand assist  level: Minimal Assistance - Patient > 75% Sit to stand assistive device: Walker (with bilateral hand splints)  Chair/bed transfer   Chair/bed transfer assist level: Minimal Assistance - Patient > 75% Chair/bed transfer assistive device: Theme park manager transfer activity did not occur:  (fatigue) Assist Level: Minimal Assistance - Patient > 75%     Care Tool Cognition  Expression of Ideas and Wants Expression of Ideas and Wants: 4. Without difficulty (complex and basic) - expresses complex messages without difficulty and with speech that is clear and easy to understand  Understanding Verbal and Non-Verbal Content Understanding Verbal and Non-Verbal Content: 4. Understands (complex and basic) - clear comprehension without cues or repetitions   Memory/Recall Ability Memory/Recall Ability : Current season;Location of own room;Staff names and faces;That he or she is in a hospital/hospital unit   Refer to Care Plan for Long Term Goals  SHORT TERM GOAL WEEK 1 OT Short Term Goal 1 (Week 1):  Pt will complete grooming with SBA with AE as necessary OT Short Term Goal 2 (Week 1): Pt will complete LB dressing at Min A with AS as necessary OT Short Term Goal 3 (Week 1): Pt will complete donning/doffing wrist stabilizing u-cuff for feeding/grooming as necessary with SBA  Recommendations for other services: Neuropsych and Therapeutic Recreation  Pet therapy and Stress management   Skilled Therapeutic Intervention ADL ADL Eating: Set up;Supervision/safety Where Assessed-Eating: Chair Grooming: Minimal assistance Where Assessed-Grooming: Sitting at sink Upper Body Bathing: Supervision/safety Where Assessed-Upper Body Bathing: Shower Lower Body Bathing: Supervision/safety Where Assessed-Lower Body Bathing: Shower Upper Body Dressing: Moderate assistance Where Assessed-Upper Body Dressing: Chair Lower Body Dressing: Maximal assistance Where Assessed-Lower Body Dressing:  Standing at sink;Sitting at sink Toileting: Moderate assistance Where Assessed-Toileting: Teacher, adult education: Curator Method: Proofreader: Gaffer: International aid/development worker Method: Ship broker: Walk in shower;Grab Geophysical data processor Transfer: Minimal assistance Film/video editor Method: Designer, industrial/product: Grab bars;Transfer tub bench Mobility  Bed Mobility Bed Mobility: Rolling Right;Rolling Left;Supine to Sit;Sit to Supine;Sitting - Scoot to Edge of Bed Rolling Right: Independent Rolling Left: Independent Supine to Sit: Contact Guard/Touching assist Sitting - Scoot to Edge of Bed: Supervision/Verbal cueing Sit to Supine: Supervision/Verbal cueing Transfers Sit to Stand: Min A Stand to Sit: Min A    1:1 evaluation and treatment session initiated this date. OT roles, goals and purpose discussed with pt as well as therapy schedule. ADL completed this date with levels of assist listed above. Pt presenting with increased ataxia and bilateral wrist drop, presenting as a barrier for completion of ADLs. Pt with bilateral AFOs and OT set pt up woth bilateral wrist stabilizing u-cuffs for increased wrist positioning. OT messaged MD for bilateral resting hand splints for nighttime wear. Pt with decreased conditioning after ADL and educated on energy conservation during activities. Pt would benefit from skilled OT in IPR setting in order to maximize independence with ADLs upon D/C.   Discharge Criteria: Patient will be discharged from OT if patient refuses treatment 3 consecutive times without medical reason, if treatment goals not met, if there is a change in medical status, if patient makes no progress towards goals or if patient is discharged from hospital.  The above assessment, treatment plan, treatment alternatives and goals were  discussed and mutually agreed upon: by patient  Velia Meyer, OTD, OTR/L 11/11/2023, 12:48 PM

## 2023-11-11 NOTE — Progress Notes (Signed)
Occupational Therapy Session Note  Patient Details  Name: Jonathan Luna MRN: [id number] Date of Birth: 1966-05-01  Today's Date: 11/11/2023 OT Individual Time: 1320-1345 OT Individual Time Calculation (min): 25 min    Short Term Goals: Week 1:  OT Short Term Goal 1 (Week 1): Pt will complete grooming with SBA with AE as necessary OT Short Term Goal 2 (Week 1): Pt will complete LB dressing at Min A with AS as necessary OT Short Term Goal 3 (Week 1): Pt will complete donning/doffing wrist stabilizing u-cuff for feeding/grooming as necessary with SBA  Skilled Therapeutic Interventions/Progress Updates:      Therapy Documentation Precautions:  Precautions Precautions: Fall Restrictions Weight Bearing Restrictions Per Provider Order: No General: "This feels so much better!" Pt seated in W/C upon OT arrival, agreeable to OT.  Pain: no pain reported   Other Treatments: Pt in bilateral wrist supporting u-cuffs. OT came to check on skin after eval, noticing some red spots from u-cuffs. OT added splinting padding in order to prevent skin break down. OT reported to have pt tell nurse if he feels skin irritation or sees redness. OT messaged MD for BUE resting hand splints for nighttime use. OT also adjusted u-cuffs in order to lace wrist in increased extension position.   Pt seated in W/C at end of session with W/C alarm donned, call light within reach and 4Ps assessed.    Therapy/Group: Individual Therapy  Velia Meyer, OTD, OTR/L 11/11/2023, 3:59 PM

## 2023-11-11 NOTE — Plan of Care (Signed)
  Problem: RH Eating Goal: LTG Patient will perform eating w/assist, cues/equip (OT) Description: LTG: Patient will perform eating with assist, with/without cues using equipment (OT) Flowsheets (Taken 11/11/2023 1554) LTG: Pt will perform eating with assistance level of: Independent with assistive device    Problem: RH Grooming Goal: LTG Patient will perform grooming w/assist,cues/equip (OT) Description: LTG: Patient will perform grooming with assist, with/without cues using equipment (OT) Flowsheets (Taken 11/11/2023 1554) LTG: Pt will perform grooming with assistance level of: Independent with assistive device    Problem: RH Bathing Goal: LTG Patient will bathe all body parts with assist levels (OT) Description: LTG: Patient will bathe all body parts with assist levels (OT) Flowsheets (Taken 11/11/2023 1554) LTG: Pt will perform bathing with assistance level/cueing: Independent with assistive device    Problem: RH Dressing Goal: LTG Patient will perform upper body dressing (OT) Description: LTG Patient will perform upper body dressing with assist, with/without cues (OT). Flowsheets (Taken 11/11/2023 1554) LTG: Pt will perform upper body dressing with assistance level of: Independent with assistive device Goal: LTG Patient will perform lower body dressing w/assist (OT) Description: LTG: Patient will perform lower body dressing with assist, with/without cues in positioning using equipment (OT) Flowsheets (Taken 11/11/2023 1554) LTG: Pt will perform lower body dressing with assistance level of: Independent with assistive device   Problem: RH Toileting Goal: LTG Patient will perform toileting task (3/3 steps) with assistance level (OT) Description: LTG: Patient will perform toileting task (3/3 steps) with assistance level (OT)  Flowsheets (Taken 11/11/2023 1554) LTG: Pt will perform toileting task (3/3 steps) with assistance level: Independent with assistive device   Problem: RH Toilet  Transfers Goal: LTG Patient will perform toilet transfers w/assist (OT) Description: LTG: Patient will perform toilet transfers with assist, with/without cues using equipment (OT) Flowsheets (Taken 11/11/2023 1554) LTG: Pt will perform toilet transfers with assistance level of: Independent with assistive device   Problem: RH Tub/Shower Transfers Goal: LTG Patient will perform tub/shower transfers w/assist (OT) Description: LTG: Patient will perform tub/shower transfers with assist, with/without cues using equipment (OT) Flowsheets (Taken 11/11/2023 1554) LTG: Pt will perform tub/shower stall transfers with assistance level of: Independent with assistive device

## 2023-11-11 NOTE — Progress Notes (Signed)
Physical Therapy Session Note  Patient Details  Name: Jonathan Luna MRN: [id number] Date of Birth: [birth year]  Today's Date: 11/11/2023 PT Individual Time: 1350-1507 PT Individual Time Calculation (min): 77 min   Short Term Goals: Week 1:  PT Short Term Goal 1 (Week 1): Pt will perform SBA sit<>stand with LRAD PT Short Term Goal 2 (Week 1): Pt will ambulate >126ft with two turns with SBA and LRAD PT Short Term Goal 3 (Week 1): Pt will perform curb step with CGA and LRAD PT Short Term Goal 3 - Progress (Week 1): Discontinued (comment)  Skilled Therapeutic Interventions/Progress Updates:      Therapy Documentation Precautions:  Precautions Precautions: Fall Restrictions Weight Bearing Restrictions Per Provider Order: No General:   Pt received seated in w/c with B AFOs and wrist splints. Pt denies pain, but endorses numbness and sensation of coldness in extremities, and notes fatigue from OT session and independent theraband/dumbbell exercises. Pt agreeable to PT session.  Pt dependently transported in w/c to therapy gym.   Sharlene Motts attempt: Pt performs sit<>stand with CGA and attempts to reach over to pick up object from floor, requiring maxAx2 for balance and had difficulty picking up object d/t fine motor control and grip strength. Pt performed transfer to mat table with CGA and attempted to stand unsupported from mat table and experienced B buckling of knees requiring maxA from PT to ensure pt landed back on the mat table. After CGA sit<>stand, pt attempted to stand w/o UE support but was unable d/t fatigue. Berg d/c d/t safety concerns and pt LE fatigue. Discussed importance of monitoring fatigue levels and using that to inform what is safe during transfers to avoid falls.   Mat exercises: - scapular protraction propped on elbows in prone 2x6 to promote UE scapular stability and proximal strength - single arm 1x6 prone to sphinx pose to promote UE WB and stability, R easier than L -  prone to QDP 1x6 with minA stabilizing through L wrist d/t fatigue and weakness - prone TKE 1x8 - modified forearm plank (on knees) 1x5 reps five second hold for trunk control and UE WB with verbal cues for scapular protraction and core engagement - HS curl on green physio ball 1x20 - straight leg HS bridge on physioball 1x6 with verbal cues for HS, glute, and core engagement  Pt performed squat pivot transfer with minA from mat to w/c d/t LE fatigue. Pt dependently transported in w/c back to room and performed squat pivot transfer with minA to bed. Pt doffed shirt with SBA and donned shirt with minA for L UE. Pt moved from seated<>supine with SBA and was left supine in bed with all needs in reach and alarm on.      Therapy/Group: Individual Therapy  Collins Scotland 11/11/2023, 4:09 PM

## 2023-11-11 NOTE — Progress Notes (Signed)
Inpatient Rehabilitation  Patient information reviewed and entered into eRehab system by [facility]. Karen Kays., CCC/SLP, PPS Coordinator.  Information including medical coding, functional ability and quality indicators will be reviewed and updated through discharge.

## 2023-11-11 NOTE — Progress Notes (Signed)
Physical Therapy Assessment and Plan  Patient Details  Name: Jonathan Luna MRN: [id number] Date of Birth: 11-11-1965  PT Diagnosis: Abnormality of gait, Ataxia, Ataxic gait, Coordination disorder, Impaired sensation, and Muscle weakness Rehab Potential: Good ELOS: 2 weeks   Today's Date: 11/11/2023 PT Individual Time: 0920-1018 PT Individual Time Calculation (min): 58 min    Hospital Problem: Principal Problem:   CIDP (chronic inflammatory demyelinating polyneuropathy) (HCC)   Past Medical History:  Past Medical History:  Diagnosis Date   Allergy    Anxiety    Arthritis    Cataract    Community acquired pneumonia 08/28/2023   Depression    Detached retina    Heart murmur    History of bilateral cataract extraction    Hyperlipidemia    Hypothyroidism    Low back pain    Panic attack    Panic attacks    Pneumonia of right upper lobe due to infectious organism 08/18/2023   Past Surgical History:  Past Surgical History:  Procedure Laterality Date   CATARACT EXTRACTION     EYE SURGERY     IR FLUORO GUIDE CV LINE RIGHT  10/29/2023   IR US GUIDE VASC ACCESS RIGHT  10/29/2023    Assessment & Plan Clinical Impression: Patient is a 58 y.o. year old R handed male who was discharged from CIR on 10/20/2023 due to acute inflammatory demyelinating polyneuropathy treated with 5 day course of IVIG. He has had issues with ongoing numbness. He presented to the ED on 10/25/2023 where exam was notable for decreased sensation to bilateral side of face and cheek as well as decrease sensation to bilateral forearms and hands. He performed well on NIF and was able to ambulate with RW. Discharged with neurology follow-up as outpatient on 1/16. At that visit with Dr. Terrace Arabia, he agreed to admission to the hospital for PLEX and admitted by [facility]. Dr. Amada Jupiter consulted. PLEX ordered for 5 treatment as well as VC and NIF every 12 hours. Medically stabilized. Labs stable. Tolerating diet. NIF greater than -40 cm  of water and incentive spirometry 2500 mL. 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 Patient transferred to CIR on 11/10/2023 .   Patient currently requires min with mobility secondary to muscle weakness and ataxia and decreased coordination.  Prior to hospitalization, patient was modified independent  with mobility and lived with Spouse in a House home.  Home access is 1Stairs to enter.  Patient will benefit from skilled PT intervention to maximize safe functional mobility, minimize fall risk, and decrease caregiver burden for planned discharge home with intermittent assist.  Anticipate patient will benefit from follow up OP at discharge.  PT - End of Session Activity Tolerance: Tolerates 10 - 20 min activity with multiple rests Endurance Deficit: Yes Endurance Deficit Description: decreased PT Assessment Rehab Potential (ACUTE/IP ONLY): Good PT Barriers to Discharge: Decreased caregiver support PT Barriers to Discharge Comments: house has 1 STE with no rails PT Patient demonstrates impairments in the following area(s): Balance;Motor;Sensory;Perception;Endurance;Pain PT Transfers Functional Problem(s): Bed Mobility;Bed to Chair;Furniture;Car PT Locomotion Functional Problem(s): Ambulation PT Plan PT Intensity: Minimum of 1-2 x/day ,45 to 90 minutes PT Frequency: 5 out of 7 days PT Duration Estimated Length of Stay: 2 weeks PT Treatment/Interventions: Ambulation/gait training;Balance/vestibular training;Community reintegration;Discharge planning;Disease management/prevention;DME/adaptive equipment instruction;Functional mobility training;Neuromuscular re-education;Pain management;Patient/family education;Splinting/orthotics;Stair training;Therapeutic  Activities;Therapeutic Exercise;UE/LE Strength taining/ROM;UE/LE  Coordination activities;Visual/perceptual remediation/compensation;Wheelchair propulsion/positioning PT Transfers Anticipated Outcome(s): mod I PT Locomotion Anticipated Outcome(s): mod I PT Recommendation Recommendations for Other Services: None Follow Up Recommendations: Outpatient PT Patient destination: Home Equipment Recommended: To be determined   PT Evaluation Precautions/Restrictions Precautions Precautions: Fall Restrictions Weight Bearing Restrictions Per Provider Order: No General Chart Reviewed: Yes Family/Caregiver Present: No Vital Signs  Pain Interference Pain Interference Pain Effect on Sleep: 2. Occasionally Pain Interference with Therapy Activities: 2. Occasionally Pain Interference with Day-to-Day Activities: 2. Occasionally Home Living/Prior Functioning Home Living Available Help at Discharge: Family;Available PRN/intermittently Type of Home: House Home Access: Stairs to enter [employer] of Steps: 1 Entrance Stairs-Rails: None Home Layout: One level Bathroom Shower/Tub: Armed forces operational officer Accessibility: No Additional Comments: owns TTB, rollator, RW  Lives With: Spouse Prior Function Level of Independence: Independent with basic ADLs;Independent with homemaking with ambulation;Independent with gait;Independent with transfers  Able to Take Stairs?: Yes Driving: Yes Vocation: Full time employment Leisure: Hobbies-yes (Comment) (watching sports, weightlifting) Cognition Overall Cognitive Status: Within Functional Limits for tasks assessed Arousal/Alertness: Awake/alert Orientation Level: Oriented X4 Memory: Appears intact Awareness: Appears intact Problem Solving: Appears intact Safety/Judgment: Appears intact Sensation Sensation Light Touch: Impaired Detail Peripheral sensation comments: decreased LE sensation globally, decreased  sensation on palmar surface of hand Light Touch Impaired Details: Impaired RUE;Impaired LLE;Impaired LUE;Impaired RLE Hot/Cold: Appears Intact Proprioception: Appears Intact Stereognosis: Not tested Additional Comments: numbness in BLE and stomach and Lt side of face from chin to forehead and Rt side of lips Coordination Gross Motor Movements are Fluid and Coordinated: No Fine Motor Movements are Fluid and Coordinated: No Coordination and Movement Description: weakness and ataxia, Lt worse than Rt Finger Nose Finger Test: dysmetria, intention tremor Heel Shin Test: WFL (pt states it is challenging) Motor  Motor Motor: Ataxia Motor - Skilled Clinical Observations: ataxia worse on Lt than Rt   Trunk/Postural Assessment  Cervical Assessment Cervical Assessment: Within Functional Limits Thoracic Assessment Thoracic Assessment: Within Functional Limits Lumbar Assessment Lumbar Assessment: Within Functional Limits Postural Control Postural Control: Within Functional Limits Trunk Control: ataxic  Balance Balance Balance Assessed: Yes Static Sitting Balance Static Sitting - Balance Support: Feet supported Static Sitting - Level of Assistance: 6: Modified independent (Device/Increase time) Dynamic Sitting Balance Dynamic Sitting - Balance Support: Feet supported;During functional activity Dynamic Sitting - Level of Assistance: 5: Stand by assistance Dynamic Sitting - Balance Activities: Reaching for objects;Forward lean/weight shifting Sitting balance - Comments: sits unsupported EOB Static Standing Balance Static Standing - Balance Support: Bilateral upper extremity supported;During functional activity Static Standing - Level of Assistance: 4: Min assist Dynamic Standing Balance Dynamic Standing - Balance Support: During functional activity;Bilateral upper extremity supported Dynamic Standing - Level of Assistance:  (CGA) Dynamic Standing - Balance Activities: Reaching for  objects;Forward lean/weight shifting;Reaching across midline Extremity Assessment  RUE Assessment RUE Assessment: Exceptions to [facility] Passive Range of Motion (PROM) Comments: WFL Active Range of Motion (AROM) Comments: WFL proximal, wrist drop General Strength Comments: trace wrist movement, 4-/5 grip, 3+/5 proximal joints LUE Assessment Active Range of Motion (AROM) Comments: WFL proximal, wrist drop General Strength Comments: muscle twitch in forearm for wrist movement, 4-/5 grip, 3+/5 proximal joints RLE Assessment RLE Assessment: Exceptions to [facility] RLE Strength Right Hip Flexion: 3/5 Right Knee Flexion: 3+/5 Right Knee Extension: 4/5 Right Ankle Dorsiflexion: 2+/5 Right Ankle Plantar Flexion: 3+/5 LLE Assessment LLE Assessment: Exceptions to [facility] LLE Strength Left Hip Flexion: 3/5 Left Knee Flexion: 3+/5 Left Knee Extension: 4/5 Left Ankle  Dorsiflexion: 2+/5 Left Ankle Plantar Flexion: 3+/5  Care Tool Care Tool Bed Mobility Roll left and right activity   Roll left and right assist level: Independent    Sit to lying activity   Sit to lying assist level: Supervision/Verbal cueing    Lying to sitting on side of bed activity   Lying to sitting on side of bed assist level: the ability to move from lying on the back to sitting on the side of the bed with no back support.: Contact Guard/Touching assist (Pt had difficulty pushing through UEs to get to sitting d/t UE weakness)     Care Tool Transfers Sit to stand transfer   Sit to stand assist level: Contact Guard/Touching assist Sit to stand assistive device: Walker (with bilateral hand splints)  Chair/bed transfer   Chair/bed transfer assist level: Contact Guard/Touching assist Chair/bed transfer assistive device: Electronics engineer transfer activity did not occur:  (fatigue and imbalance)        Care Tool Locomotion Ambulation   Assist level: Contact Guard/Touching assist Assistive device: Walker-rolling  (bilateral hand splints) Max distance: 80  Walk 10 feet activity   Assist level: Contact Guard/Touching assist Assistive device: Walker-rolling   Walk 50 feet with 2 turns activity   Assist level: Minimal Assistance - Patient > 75% Assistive device: Walker-rolling  Walk 150 feet activity Walk 150 feet activity did not occur:  (fatigue and imbalance)      Walk 10 feet on uneven surfaces activity Walk 10 feet on uneven surfaces activity did not occur:  (fatigue and imbalance)      Stairs Stair activity did not occur:  (fatigue and imbalance)        Walk up/down 1 step activity Walk up/down 1 step or curb (drop down) activity did not occur:  (fatigue and imbalance)      Walk up/down 4 steps activity Walk up/down 4 steps activity did not occur:  (fatigue and imbalance)      Walk up/down 12 steps activity Walk up/down 12 steps activity did not occur:  (fatigue and imbalance)      Pick up small objects from floor Pick up small object from the floor (from standing position) activity did not occur:  (fatigue and imbalance)      Wheelchair Is the patient using a wheelchair?: Yes Type of Wheelchair: Manual Wheelchair activity did not occur: Safety/medical concerns      Wheel 50 feet with 2 turns activity Wheelchair 50 feet with 2 turns activity did not occur:  (fatigue and imbalance)    Wheel 150 feet activity Wheelchair 150 feet activity did not occur:  (fatigue and imbalance)      Refer to Care Plan for Long Term Goals  SHORT TERM GOAL WEEK 1 PT Short Term Goal 1 (Week 1): Pt will perform SBA sit<>stand with LRAD PT Short Term Goal 2 (Week 1): Pt will ambulate >125ft with two turns with SBA and LRAD PT Short Term Goal 3 (Week 1): Pt will perform curb step with CGA and LRAD  Recommendations for other services: None   Skilled Therapeutic Intervention Mobility Bed Mobility Bed Mobility: Rolling Right;Rolling Left;Supine to Sit;Sit to Supine;Sitting - Scoot to Edge of  Bed Rolling Right: Independent Rolling Left: Independent Supine to Sit: Contact Guard/Touching assist Sitting - Scoot to Edge of Bed: Supervision/Verbal cueing Sit to Supine: Supervision/Verbal cueing Transfers Transfers: Sit to Stand;Stand to Sit;Stand Pivot Transfers Sit to Stand: Contact Guard/Touching assist Stand to Sit: Contact Guard/Touching assist Stand  Pivot Transfers: Contact Guard/Touching assist Stand Pivot Transfer Details: Verbal cues for safe use of DME/AE Transfer (Assistive device): Rolling walker (with bilateral hand splints) Locomotion  Gait Ambulation: Yes Gait Assistance: Contact Guard/Touching assist Gait Distance (Feet): 80 Feet Assistive device: Rolling walker Gait Gait: Yes Gait Pattern: Impaired Gait Pattern: Step-through pattern;Right steppage;Left steppage;Right genu recurvatum;Left genu recurvatum;Ataxic Gait velocity: Mining engineer Mobility: No  Evaluation completed (see details above) with patient education regarding purpose of PT evaluation, PT POC and goals, therapy schedule, weekly team meetings, and other CIR information including safety plan and fall risk safety.  Pt received supine in bed and reported 6.5/10 low back pain, nsg notified in person and medication was received. Pt reports disappointment about being back in CIR and that his goal is to walk out of CIR again independently and be able to guide his wife. Pt also reports wanting to resume weight lifting.   Pt performed supine<>sit with SBA and states it is difficult for him to push through his hands now d/t recent CIPD exacerbation. Discussed bilateral hand splints on RW d/t hand function and benefits of RW vs rollator with CLOF.  Pt performed sit<>stand with CGA and verbal cues to bring RW closer before standing. SPT dependently attached velcro on bilateral hand splints. Pt performed ~80' gait training with RW and B AFOs, with CGA and minA for two turns d/t decreased  BOS and ataxia. Verbal cues given for keeping RW closer to person. Pt displayed bilateral steppage gait (increased hip and knee flexion) and bilateral hyperextension thrust.  Pt reports 8/10 difficulty on RPE scale with ambulation.  Pt left seated in chair with seat belt alarm on and all needs in reach.     Discharge Criteria: Patient will be discharged from PT if patient refuses treatment 3 consecutive times without medical reason, if treatment goals not met, if there is a change in medical status, if patient makes no progress towards goals or if patient is discharged from hospital.  The above assessment, treatment plan, treatment alternatives and goals were discussed and mutually agreed upon: by patient  Collins Scotland 11/11/2023, 12:24 PM

## 2023-11-11 NOTE — Progress Notes (Signed)
NIF: greater than -40 IS: 2500 ml  With excellent patient effort.

## 2023-11-11 NOTE — Progress Notes (Signed)
Orthopedic Tech Progress Note Patient Details:  YUSIF GNAU Apr 09, 1966 [id number]  Called in order to [employer] for a BUE RESTING HAND SPLINTS   Patient ID: DINARI STGERMAINE, male   DOB: 12-20-1965, 58 y.o.   MRN: [id number]  Donald Pore 11/11/2023, 3:25 PM

## 2023-11-12 DIAGNOSIS — F419 Anxiety disorder, unspecified: Secondary | ICD-10-CM

## 2023-11-12 DIAGNOSIS — R7401 Elevation of levels of liver transaminase levels: Secondary | ICD-10-CM

## 2023-11-12 DIAGNOSIS — G6181 Chronic inflammatory demyelinating polyneuritis: Secondary | ICD-10-CM | POA: Diagnosis not present

## 2023-11-12 DIAGNOSIS — D72829 Elevated white blood cell count, unspecified: Secondary | ICD-10-CM | POA: Diagnosis not present

## 2023-11-12 DIAGNOSIS — I10 Essential (primary) hypertension: Secondary | ICD-10-CM | POA: Diagnosis not present

## 2023-11-12 LAB — COMPREHENSIVE METABOLIC PANEL
ALT: 61 U/L — ABNORMAL HIGH (ref 0–44)
AST: 29 U/L (ref 15–41)
Albumin: 3.8 g/dL (ref 3.5–5.0)
Alkaline Phosphatase: 32 U/L — ABNORMAL LOW (ref 38–126)
Anion gap: 8 (ref 5–15)
BUN: 15 mg/dL (ref 6–20)
CO2: 28 mmol/L (ref 22–32)
Calcium: 8.9 mg/dL (ref 8.9–10.3)
Chloride: 101 mmol/L (ref 98–111)
Creatinine, Ser: 0.94 mg/dL (ref 0.61–1.24)
GFR, Estimated: >60 mL/min (ref >60)
Glucose, Bld: 80 mg/dL (ref 70–99)
Potassium: 3.8 mmol/L (ref 3.5–5.1)
Sodium: 137 mmol/L (ref 135–145)
Total Bilirubin: 0.6 mg/dL (ref 0.0–1.2)
Total Protein: 6 g/dL — ABNORMAL LOW (ref 6.5–8.1)

## 2023-11-12 LAB — CBC WITH DIFFERENTIAL/PLATELET
Abs Immature Granulocytes: 0.22 10*3/uL — ABNORMAL HIGH (ref 0.00–0.07)
Basophils Absolute: 0.1 10*3/uL (ref 0.0–0.1)
Basophils Relative: 1 %
Eosinophils Absolute: 0.3 10*3/uL (ref 0.0–0.5)
Eosinophils Relative: 2 %
HCT: 41.5 % (ref 39.0–52.0)
Hemoglobin: 13.9 g/dL (ref 13.0–17.0)
Immature Granulocytes: 2 %
Lymphocytes Relative: 44 %
Lymphs Abs: 6.6 10*3/uL — ABNORMAL HIGH (ref 0.7–4.0)
MCH: 30.8 pg (ref 26.0–34.0)
MCHC: 33.5 g/dL (ref 30.0–36.0)
MCV: 92 fL (ref 80.0–100.0)
Monocytes Absolute: 1 10*3/uL (ref 0.1–1.0)
Monocytes Relative: 6 %
Neutro Abs: 6.9 10*3/uL (ref 1.7–7.7)
Neutrophils Relative %: 45 %
Platelets: 412 10*3/uL — ABNORMAL HIGH (ref 150–400)
RBC: 4.51 MIL/uL (ref 4.22–5.81)
RDW: 13 % (ref 11.5–15.5)
WBC: 15 10*3/uL — ABNORMAL HIGH (ref 4.0–10.5)
nRBC: 0 % (ref 0.0–0.2)

## 2023-11-12 MED ORDER — CLONAZEPAM 0.5 MG PO TABS
1.0000 mg | ORAL_TABLET | Freq: Two times a day (BID) | ORAL | Status: DC | PRN
Start: 2023-11-12 — End: 2023-12-08
  Administered 2023-11-12 – 2023-12-07 (×28): 1 mg via ORAL
  Filled 2023-11-12 (×29): qty 2

## 2023-11-12 NOTE — Progress Notes (Signed)
PROGRESS NOTE   Subjective/Complaints: Patient reports continued anxiety that he feels is worse with the prednisone.  Patient reports that he feels he cannot take his deep breath as he was able to previously.  Denies shortness of breath.  Patient continues to report that his legs feel cold.  ROS: Patient denies fever, new vision changes, dizziness, nausea, vomiting, diarrhea,  shortness of breath or chest pain, headache.  + anxious -continued   Objective:   No results found. Recent Labs    11/12/23 0552  WBC 15.0*  HGB 13.9  HCT 41.5  PLT 412*   Recent Labs    11/12/23 0552  NA 137  K 3.8  CL 101  CO2 28  GLUCOSE 80  BUN 15  CREATININE 0.94  CALCIUM 8.9    Intake/Output Summary (Last 24 hours) at 11/12/2023 0829 Last data filed at 11/12/2023 0714 Gross per 24 hour  Intake 836 ml  Output 1300 ml  Net -464 ml        Physical Exam: Vital Signs Blood pressure (!) 147/90, pulse 67, temperature 97.8 F (36.6 C), temperature source Oral, resp. rate 18, height 5\' 7"  (1.702 m), weight 85.9 kg, SpO2 98%.   General:  No apparent distress, + obese HEENT: Head is normocephalic, atraumatic, mucous membranes dry Neck: Supple without JVD or lymphadenopathy Heart: Reg rate and rhythm. No murmurs rubs or gallops Chest: CTA bilaterally without wheezes, rales, or rhonchi; no distress Abdomen: Soft, non-tender, non-distended, bowel sounds positive. Extremities: No clubbing, cyanosis, or edema. Pulses are 2+ Psych: Pt's affect is appropriate. Pt is cooperative Skin: Clean and intact without signs of breakdown Feet warm to touch, legs a little cool to touch Neuro:  Decreased to light touch from elbows to finger tips B/L.  Talking in full sentences L>R Some muscle spasms seen in LE's- but not part of UMN syndrome - not seen today 1/29 Musculoskeletal:  General: Normal range of motion.     Cervical back: Neck supple. No  tenderness.     Comments: RUE- biceps/triceps 4/5; WE 2-/5; Grip 3-/5 and FA 0/5 LUE- biceps and triceps 4/5; WE 0/5; Grip 2+/5 and FA 1/5 RLE- HF 4-/5; KE 4/5; KF 4-/5; DF 2/5 and PF 2+/5 LLE- HF 4-/5; KE 4/5; KF 4-/5 DF 1/5 and PF 2-/5 A little flaccid in UEs  Exam stable  Assessment/Plan: 1. Functional deficits which require 3+ hours per day of interdisciplinary therapy in a comprehensive inpatient rehab setting. Physiatrist is providing close team supervision and 24 hour management of active medical problems listed below. Physiatrist and rehab team continue to assess barriers to discharge/monitor patient progress toward functional and medical goals  Care Tool:  Bathing    Body parts bathed by patient: Right arm, Left lower leg, Face, Left arm, Chest, Abdomen, Front perineal area, Buttocks, Right upper leg, Left upper leg, Right lower leg         Bathing assist Assist Level: Supervision/Verbal cueing     Upper Body Dressing/Undressing Upper body dressing   What is the patient wearing?: Pull over shirt    Upper body assist Assist Level: Moderate Assistance - Patient 50 - 74%    Lower Body  Dressing/Undressing Lower body dressing      What is the patient wearing?: Underwear/pull up, Pants     Lower body assist Assist for lower body dressing: Maximal Assistance - Patient 25 - 49%     Toileting Toileting    Toileting assist Assist for toileting: Moderate Assistance - Patient 50 - 74%     Transfers Chair/bed transfer  Transfers assist     Chair/bed transfer assist level: Minimal Assistance - Patient > 75% Chair/bed transfer assistive device: Walker, Archivist   Ambulation assist      Assist level: Contact Guard/Touching assist Assistive device: Walker-rolling (bilateral hand splints) Max distance: 80   Walk 10 feet activity   Assist     Assist level: Contact Guard/Touching assist Assistive device: Walker-rolling   Walk  50 feet activity   Assist    Assist level: Minimal Assistance - Patient > 75% Assistive device: Walker-rolling    Walk 150 feet activity   Assist Walk 150 feet activity did not occur:  (fatigue and imbalance)         Walk 10 feet on uneven surface  activity   Assist Walk 10 feet on uneven surfaces activity did not occur:  (fatigue and imbalance)         Wheelchair     Assist Is the patient using a wheelchair?: Yes Type of Wheelchair: Manual Wheelchair activity did not occur: Safety/medical concerns         Wheelchair 50 feet with 2 turns activity    Assist    Wheelchair 50 feet with 2 turns activity did not occur:  (fatigue and imbalance)       Wheelchair 150 feet activity     Assist  Wheelchair 150 feet activity did not occur:  (fatigue and imbalance)       Blood pressure (!) 147/90, pulse 67, temperature 97.8 F (36.6 C), temperature source Oral, resp. rate 18, height 5\' 7"  (1.702 m), weight 85.9 kg, SpO2 98%.  Medical Problem List and Plan: 1. 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  Antithrombotics: -DVT/anticoagulation:  Pharmaceutical: Lovenox             -antiplatelet therapy: aspirin 81 mg daily   3. Pain Management: Tylenol as needed             -continue gabapentin 900 mg TID and 900 mg daily PRN             -continue Percocet 5/325 q 4 hours PRN   4. Mood/Behavior/Sleep: LCSW to evaluate and provide emotional support             -continue Cymbalta 60 mg daily             -continue Zoloft 25 mg daily             -continue clonazepam 0.5 mg BID prn             -  antipsychotic agents:  n/a  -Patient requested increase of clonazepam due to anxiety.  He takes this medication chronically PDMP reviewed, will increase temporarily to 1 mg twice daily as needed   5. Neuropsych/cognition: This patient is capable of making decisions on his own behalf.   6. Skin/Wound Care: Routine skin care checks   7. Fluids/Electrolytes/Nutrition: Routine Is and Os and follow-up chemistries   8: Hypertension: monitor TID and prn             -continue Avapro 300 mg daily             -continue Toprol-XL 25 mg daily  -1/30 stable overall, continue to monitor     11/12/2023    4:23 AM 11/11/2023    8:29 PM 11/11/2023    3:00 PM  Vitals with BMI  Systolic 147 116 629  Diastolic 90 68 80  Pulse 67 67 83      9: Hypothyroidism: continue Synthroid 125 mcg daily   10: Tobacco use: cessation counseling -Nicorette gum as needed (not using)             -? 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 end date?) -follow-up Dr. Terrace Arabia -1/30 respiratory function- NIF >-40 and IS 2500, stable. Continue to monitor   12: Leukocytosis: likely steroid induced, improving; no fever              -follow-up CBC with differential Thursday 1/30 and q Monday  1/30 WBC down to 15.0, continue to monitor  13.  Elevated ALT and alk phos  -Avoid hepatotoxic medications.  Monitor trend    LOS: 2 days A FACE TO FACE EVALUATION WAS PERFORMED  Fanny Dance 11/12/2023, 8:29 AM

## 2023-11-12 NOTE — Progress Notes (Signed)
Pt achieved a NIF of >-40 and was bale to get 2500 on IS with great pt effort on all attempts.MD notified, RT will monitor as needed.

## 2023-11-12 NOTE — Progress Notes (Signed)
Physical Therapy Session Note  Patient Details  Name: Jonathan Luna MRN: [id number] Date of Birth: 08/07/1966  Today's Date: 11/12/2023 PT Individual Time: 0950-1106 PT Individual Time Calculation (min): 76 min   Short Term Goals: Week 1:  PT Short Term Goal 1 (Week 1): Pt will perform SBA sit<>stand with LRAD PT Short Term Goal 2 (Week 1): Pt will ambulate >172ft with two turns with SBA and LRAD PT Short Term Goal 3 (Week 1): Pt will perform curb step with CGA and LRAD PT Short Term Goal 3 - Progress (Week 1): Discontinued (comment)  Skilled Therapeutic Interventions/Progress Updates:      Therapy Documentation Precautions:  Precautions Precautions: Fall Restrictions Weight Bearing Restrictions Per Provider Order: No  Pt agreeable to PT session with emphasis on UE/LE strengthening and NMR to facilitate increased activation. Pt with left wrist pain in session, provided repositioning for relief. Dependent w/c mobility and min A with squat pivot to mat. Pt performed elevated bridges x 8 + 6 with tactile cues and stabilization of ankles to prevent sliding. Pt transitioned to UE strengthening and NMR with following activities:   -yoga block push up's x 8 (decreased gluteal clearance)   -lateral elbow press to return to midline for triceps activation 3 x 12, bilaterally   -AAROM elbow extension with Pt also performed min/mod assisted supination/pronation and wrist extension in gravity eliminated and pt presents with activation. Pt returned to room and left semi-reclined in bed, all needs in reach.     Therapy/Group: Individual Therapy  Truitt Leep Truitt Leep PT, DPT  11/12/2023, 4:00 PM

## 2023-11-12 NOTE — Group Note (Signed)
Patient Details Name: Jonathan Luna MRN: [id number] DOB: 20-Apr-1966 Today's Date: 11/12/2023  Time Calculation: OT Group Time Calculation OT Group Start Time: 1405 OT Group Stop Time: 1505 OT Group Time Calculation (min): 60 min      Group Description: Stress management: Pt participated in group session with a focus on stress mgmt, education provided on healthy coping strategies, and social interaction. Focus of session on providing coping strategies to manage new diagnosis to allow for improved mental health to increase overall quality of life . Discussed how to break down stressors into "daily hassles," "major life stressors" and "life circumstances" in an effort to allow pts to chunk their stressors into groups and determine where to best put their efforts/time when dealing with stress. Provided active listening, emotional support and therapeutic use of self. Offered education on factors that protect [name] against stress such as "daily uplifts," "healthy coping strategies" and "protective factors." Encouraged all group members to make an effort to actively recall one event from their day that was a daily uplift in an effort to protect their mindset from stressors as well as sharing this information with their caregivers to facilitate improved caregiver communication and decrease overall burden of care.  Issued pt handouts on healthy coping strategies to implement into routine.   Individual level documentation: Patient participated with full collaboration during session.   Pain: No pain  Barron Schmid 11/12/2023, 3:31 PM

## 2023-11-12 NOTE — Discharge Instructions (Addendum)
 COMMUNITY REFERRALS UPON DISCHARGE:   Inpatient Rehab Discharge Instructions  Jonathan Luna Discharge date and time:  12/08/2023  Activities/Precautions/ Functional Status: Activity: no lifting, driving, or strenuous exercise until cleared by MD Diet: regular diet Wound Care: none needed Functional status:  ___ No restrictions     ___ Walk up steps independently ___ 24/7 supervision/assistance   ___ Walk up steps with assistance _x__ Intermittent supervision/assistance  ___ Bathe/dress independently ___ Walk with walker     ___ Bathe/dress with assistance ___ Walk Independently    ___ Shower independently ___ Walk with assistance    ___x Shower with assistance _x__ No alcohol     ___ Return to work/school ________  Special Instructions:  No driving, alcohol consumption or tobacco use.  Home Health:   PT   &    OT                Agency: [facility]   Phone: [phone]  Medical Equipment/Items Ordered: 3 IN 1                                                 Agency/Supplier:[facility]   [phone]  Referral made to [facility] for SSD application   Phone: [phone]    My questions have been answered and I understand these instructions. I will adhere to these goals and the provided educational materials after my discharge from the hospital.  Patient/Caregiver Signature _______________________________ Date __________  Clinician Signature _______________________________________ Date __________  Please bring this form and your medication list with you to all your follow-up doctor's appointments.

## 2023-11-12 NOTE — Progress Notes (Signed)
Patient ID: Jonathan Luna, male   DOB: Mar 20, 1966, 58 y.o.   MRN: [id number]  Referral sent to [facility] to assist pt with SSD application. Gave pt the original forms in packet in room

## 2023-11-12 NOTE — Progress Notes (Signed)
Occupational Therapy Session Note  Patient Details  Name: Jonathan Luna MRN: [id number] Date of Birth: Feb 24, 1966  Today's Date: 11/12/2023 OT Individual Time: 0800-0900 OT Individual Time Calculation (min): 60 min    Short Term Goals: Week 1:  OT Short Term Goal 1 (Week 1): Pt will complete grooming with SBA with AE as necessary OT Short Term Goal 2 (Week 1): Pt will complete LB dressing at Min A with AS as necessary OT Short Term Goal 3 (Week 1): Pt will complete donning/doffing wrist stabilizing u-cuff for feeding/grooming as necessary with SBA  Skilled Therapeutic Interventions/Progress Updates:      Therapy Documentation Precautions:  Precautions Precautions: Fall Restrictions Weight Bearing Restrictions Per Provider Order: No General: "Thank you!" Pt supine in bed upon OT arrival, agreeable to OT session.  Pain: no pain reported  ADL: Bed mobility: SBA with increased time and ataxia noted of all extremities during transitional movement Footwear: total A seated EOB to don socks and AFOs, pt encouraged to try donning socks, pt reported he did not want to get disappointed by not being able to complete task, pt asked OT to assist Transfers: EOB>W/C with Min A and RW, ataxia in BLE  Exercises: OT donned saebo one t RUE and pt completed various purposeful grasp/release with small one inch blocks times with on/off of functional estim in order to increase wrist function and FMC of RUE. Pt completed multiple trials with ~50% accuracy.   Other Treatments: Pt received BUE resting hand splints for nighttime use. OT educated pt how to don and how splints are color coded. OT wrote on wight board and wrote note behind bed to don hand splints and notified nursing of wearing schedule.  OT moved saebo one unit to Lt wrist extensors at end of session to leave on unattended for increased NMRE of LUE.   Pt seated in W/C at end of session with W/C alarm donned, call light within reach and 4Ps  assessed.    Therapy/Group: Individual Therapy  Velia Meyer, OTD, OTR/L 11/12/2023, 2:14 PM

## 2023-11-13 ENCOUNTER — Inpatient Hospital Stay: Payer: 59 | Admitting: Adult Health

## 2023-11-13 DIAGNOSIS — R209 Unspecified disturbances of skin sensation: Secondary | ICD-10-CM

## 2023-11-13 DIAGNOSIS — F419 Anxiety disorder, unspecified: Secondary | ICD-10-CM | POA: Diagnosis not present

## 2023-11-13 DIAGNOSIS — D72829 Elevated white blood cell count, unspecified: Secondary | ICD-10-CM | POA: Diagnosis not present

## 2023-11-13 DIAGNOSIS — G6181 Chronic inflammatory demyelinating polyneuritis: Secondary | ICD-10-CM | POA: Diagnosis not present

## 2023-11-13 DIAGNOSIS — I10 Essential (primary) hypertension: Secondary | ICD-10-CM | POA: Diagnosis not present

## 2023-11-13 NOTE — Plan of Care (Signed)
  Problem: Consults Goal: RH SPINAL CORD INJURY PATIENT EDUCATION Description:  See Patient Education module for education specifics.  Outcome: Progressing   Problem: RH SAFETY Goal: RH STG ADHERE TO SAFETY PRECAUTIONS W/ASSISTANCE/DEVICE Description: STG Adhere to Safety Precautions With  cues Assistance/Device. Outcome: Progressing   Problem: RH PAIN MANAGEMENT Goal: RH STG PAIN MANAGED AT OR BELOW PT'S PAIN GOAL Description: < 4 with prns Outcome: Progressing   Problem: RH KNOWLEDGE DEFICIT SCI Goal: RH STG INCREASE KNOWLEDGE OF SELF CARE AFTER SCI Description: Patient and spouse will be able to manage care at discharge using educational resources for medications and dietary modification independently.  Outcome: Progressing

## 2023-11-13 NOTE — Progress Notes (Signed)
Occupational Therapy Session Note  Patient Details  Name: Jonathan Luna MRN: [id number] Date of Birth: 03-12-1966  Today's Date: 11/13/2023 OT Individual Time: 1400-1515 OT Individual Time Calculation (min): 75 min    Short Term Goals: Week 1:  OT Short Term Goal 1 (Week 1): Pt will complete grooming with SBA with AE as necessary OT Short Term Goal 2 (Week 1): Pt will complete LB dressing at Min A with AS as necessary OT Short Term Goal 3 (Week 1): Pt will complete donning/doffing wrist stabilizing u-cuff for feeding/grooming as necessary with SBA  Skilled Therapeutic Interventions/Progress Updates:    Pt received supine with no c/o pain, agreeable to OT session. He reported feet feeling very cold and they were in fact very cold to the touch. Session focused on BUE NMR and strengthening/coordination improvement. Pt was taken via w/c to the therapy gym for time management. He completed B dorsal wrist e-stim to challenge wrist and finger extension. With the e-stim activation he was able to complete full AROM of wrist extension. Provided visual target that also improved AROM. He then completed various activities from supine to challenge closed chain full UE extension with dowel raise and then transitioning to serratus anterior focused overhead resistance through extended wrist. E-stim on throughout session- parameters below. He then completed theraband resistive scapular retraction with mod facilitation to maintain horizontal abduction. Stand pivot's x2 with min A. He returned to his room following. He was left sitting up in the w/c with all needs met. Pt provided saebo for personal use on wrist extensors- no charge as he can complete this independently.   Ratio 1:3 Rate 35 pps Waveform- Asymmetric Ramp 1.0 Pulse 300 Intensity- 28-30 No adverse reactions after treatment and is skin intact.    Therapy Documentation Precautions:  Precautions Precautions: Fall Restrictions Weight Bearing  Restrictions Per Provider Order: No    Therapy/Group: Individual Therapy  Crissie Reese 11/13/2023, 6:20 AM

## 2023-11-13 NOTE — Progress Notes (Signed)
PROGRESS NOTE   Subjective/Complaints: Patient reports he feels like his breathing is stable.  Continues to complain that his feet feel very cold.   ROS: Patient denies fever, new vision changes, dizziness, nausea, vomiting, diarrhea,  shortness of breath or chest pain, headache. + cold feet + anxious -improved   Objective:   No results found. Recent Labs    11/12/23 0552  WBC 15.0*  HGB 13.9  HCT 41.5  PLT 412*   Recent Labs    11/12/23 0552  NA 137  K 3.8  CL 101  CO2 28  GLUCOSE 80  BUN 15  CREATININE 0.94  CALCIUM 8.9    Intake/Output Summary (Last 24 hours) at 11/13/2023 1246 Last data filed at 11/13/2023 0846 Gross per 24 hour  Intake 820 ml  Output 600 ml  Net 220 ml        Physical Exam: Vital Signs Blood pressure 93/70, pulse 73, temperature 97.6 F (36.4 C), temperature source Oral, resp. rate 17, height 5\' 7"  (1.702 m), weight 85.9 kg, SpO2 97%.   General:  No apparent distress, + obese HEENT: Head is normocephalic, atraumatic, mucous membranes moist Neck: Supple without JVD or lymphadenopathy Heart: Reg rate and rhythm. No murmurs rubs or gallops Chest: CTA bilaterally without wheezes, rales, or rhonchi; no distress Abdomen: Soft, non-tender, non-distended, bowel sounds positive. Extremities: No clubbing, cyanosis, or edema. Pulses are 2+ Feet and legs feel cold to touch Psych: Pt's affect is appropriate. Pt is cooperative Skin: Clean and intact without signs of breakdown Neuro:  Decreased to light touch from elbows to finger tips B/L.  Talking in full sentences L>R Some muscle spasms seen in LE's- but not part of UMN syndrome - not seen today 1/30 Musculoskeletal:  General: Normal range of motion.     Cervical back: Neck supple. No tenderness.     Comments: RUE- biceps/triceps 4/5; WE 2-/5; Grip 3-/5 and FA 0/5 LUE- biceps and triceps 4/5; WE 0/5; Grip 2+/5 and FA 1/5 RLE- HF 4-/5;  KE 4/5; KF 4-/5; DF 2/5 and PF 2+/5 LLE- HF 4-/5; KE 4/5; KF 4-/5 DF 1/5 and PF 2-/5 A little flaccid in UEs  ESTIM in place on LUE Exam stable  Assessment/Plan: 1. Functional deficits which require 3+ hours per day of interdisciplinary therapy in a comprehensive inpatient rehab setting. Physiatrist is providing close team supervision and 24 hour management of active medical problems listed below. Physiatrist and rehab team continue to assess barriers to discharge/monitor patient progress toward functional and medical goals  Care Tool:  Bathing    Body parts bathed by patient: Right arm, Left lower leg, Face, Left arm, Chest, Abdomen, Front perineal area, Buttocks, Right upper leg, Left upper leg, Right lower leg         Bathing assist Assist Level: Supervision/Verbal cueing     Upper Body Dressing/Undressing Upper body dressing   What is the patient wearing?: Pull over shirt    Upper body assist Assist Level: Moderate Assistance - Patient 50 - 74%    Lower Body Dressing/Undressing Lower body dressing      What is the patient wearing?: Underwear/pull up, Pants     Lower  body assist Assist for lower body dressing: Maximal Assistance - Patient 25 - 49%     Toileting Toileting    Toileting assist Assist for toileting: Moderate Assistance - Patient 50 - 74%     Transfers Chair/bed transfer  Transfers assist     Chair/bed transfer assist level: Minimal Assistance - Patient > 75% Chair/bed transfer assistive device: Walker, Archivist   Ambulation assist      Assist level: Contact Guard/Touching assist Assistive device: Walker-rolling (bilateral hand splints) Max distance: 80   Walk 10 feet activity   Assist     Assist level: Contact Guard/Touching assist Assistive device: Walker-rolling   Walk 50 feet activity   Assist    Assist level: Minimal Assistance - Patient > 75% Assistive device: Walker-rolling    Walk 150  feet activity   Assist Walk 150 feet activity did not occur:  (fatigue and imbalance)         Walk 10 feet on uneven surface  activity   Assist Walk 10 feet on uneven surfaces activity did not occur:  (fatigue and imbalance)         Wheelchair     Assist Is the patient using a wheelchair?: Yes Type of Wheelchair: Manual Wheelchair activity did not occur: Safety/medical concerns         Wheelchair 50 feet with 2 turns activity    Assist    Wheelchair 50 feet with 2 turns activity did not occur:  (fatigue and imbalance)       Wheelchair 150 feet activity     Assist  Wheelchair 150 feet activity did not occur:  (fatigue and imbalance)       Blood pressure 93/70, pulse 73, temperature 97.6 F (36.4 C), temperature source Oral, resp. rate 17, height 5\' 7"  (1.702 m), weight 85.9 kg, SpO2 97%.  Medical Problem List and Plan: 1. 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  Antithrombotics: -DVT/anticoagulation:  Pharmaceutical: Lovenox             -antiplatelet therapy: aspirin 81 mg daily   3. Pain Management: Tylenol as needed             -continue gabapentin 900 mg TID and 900 mg daily PRN             -continue Percocet 5/325 q 4 hours PRN  -Continue Cymbalta 60 mg daily   -Consider trial of Lyrica to replace gabapentin   4. Mood/Behavior/Sleep: LCSW to evaluate and provide emotional support             -continue Cymbalta 60 mg daily             -continue Zoloft 25 mg daily             -continue clonazepam 0.5 mg BID prn             -antipsychotic  agents: n/a  -Patient requested increase of clonazepam due to anxiety.   He takes this medication chronically PDMP reviewed, will increase temporarily to 1 mg twice daily as needed  -1/31 pt reports anxiety is improved   5. Neuropsych/cognition: This patient is capable of making decisions on his own behalf.   6. Skin/Wound Care: Routine skin care checks   7. Fluids/Electrolytes/Nutrition: Routine Is and Os and follow-up chemistries   8: Hypertension: monitor TID and prn             -continue Avapro 300 mg daily             -continue Toprol-XL 25 mg daily  -1/31 controlled, continue current      11/13/2023    8:40 AM 11/13/2023    3:56 AM 11/12/2023    7:54 PM  Vitals with BMI  Systolic 93 123 104  Diastolic 70 88 64  Pulse 73 72 77      9: Hypothyroidism: continue Synthroid 125 mcg daily   10: Tobacco use: cessation counseling -Nicorette gum as needed (not using)             -? 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 end date?) -follow-up Dr. Terrace Arabia -1/30 respiratory function- NIF >-40 and IS 2500, stable. Continue to monitor   12: Leukocytosis: likely steroid induced, improving; no fever              -follow-up CBC with differential Thursday 1/30 and q Monday  1/30 WBC down to 15.0, continue to monitor  13.  Elevated ALT and alk phos  -Avoid hepatotoxic medications.  Recheck Monday ordered    14. Cold feeling in b/l legs.  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  Legs do feel cold on exam.  Will check vascular study arterial however think this is less likely.  LOS: 3 days A FACE TO FACE EVALUATION WAS PERFORMED  Fanny Dance 11/13/2023, 12:46 PM

## 2023-11-13 NOTE — Progress Notes (Signed)
Physical Therapy Session Note  Patient Details  Name: Jonathan Luna MRN: [id number] Date of Birth: 08/05/1966  Today's Date: 11/13/2023 PT Individual Time: 0900-1000 and 1130-1200, and 1300-1340 PT Individual Time Calculation (min): 60 min and , and 40 min  Short Term Goals: Week 1:  PT Short Term Goal 1 (Week 1): Pt will perform SBA sit<>stand with LRAD PT Short Term Goal 2 (Week 1): Pt will ambulate >183ft with two turns with SBA and LRAD PT Short Term Goal 3 (Week 1): Pt will perform curb step with CGA and LRAD PT Short Term Goal 3 - Progress (Week 1): Discontinued (comment)  Skilled Therapeutic Interventions/Progress Updates: Pt presented in w/c agreeable to therapy. Pt c/o pain/coldness in distal LE. Session to focus on BLE and core strengthening. Pt transported to day room for energy conservation. Completed stand step transfer with minA to high/low mat. Pt transferred to supine and completed the following therex as below. Pt interested in performing as HEP therefore printed out and provided.   All therex performed bilaterally SLR x 10  SAQ set 1 2.5 lbs, set 2 7.5lbs  Bridge with add squeeze 2 x 10 Hip abd in sidelying x 10 Sidelying clamshell x 10  Access Code: [email or web address] URL: [email or web address] Date: 11/13/2023 Prepared by: Cornell Barman Kimiyah Blick  Exercises - Small Range Straight Leg Raise  - 1 x daily - 7 x weekly - 3 sets - 10 reps - Supine Short Arc Quad  - 1 x daily - 7 x weekly - 3 sets - 10 reps - Supine Bridge with Mini Swiss Ball Between Knees  - 1 x daily - 7 x weekly - 3 sets - 10 reps - Sidelying Hip Abduction  - 1 x daily - 7 x weekly - 3 sets - 10 reps - Clamshell  - 1 x daily - 7 x weekly - 3 sets - 10 reps  Tx2: Pt presented in w/c agreeable to therapy. Pt with unrated pain/tingling in distal BLE. Pt transported to day room and participated in dance group. Pt worked on UB/LB/ and core through global movement with additional psychosocial support. Pt  with good overall tolerance to activity with occasional encouragement to take therapeutic breaks as needed. Pt noted to demonstrate increased RUE>LUE strength and fair core activation as pt was able to pull self up to sitting and at end grasp knee to pull towards chest. At end of session pt transported back to room and remained in w/c with call bell within reach and needs met.   Tx3: Pt presented in w/c agreeable to therapy. Pt with unrated pain in distal LE. Nsg aware and advised not able to receive pain meds until end of therapy. Pt transported to day room and completed stand step transfer to NuStep with minA and RW. Pt set up on NuStep and completed x 6 min L3 for global strengthening and cardiovascular endurance. Pt then needed 2 min break and was able to complete an additional x 4 min at L4. Pt indicated 6/10 on mBORG with activity. Pt completed transfer in same manner back to w/c however noted improved coordination with BLE when stepping. Pt transported back to room and pt with questions regarding CIDP and time for recovery as well questioned what additional activities can be performed while not in therapy. Provided education to pt regarding energy conservation and allowing time between therapies to allow for recovery due to recent flare up. Pt voiced understanding. PTA did provide towel roll to allow pt to perfom SAQ  while in bed (discussed pt can ask nsg to set up while providing meds). Pt left in w/c at end of session with call bell within reach and needs met.      Therapy Documentation Precautions:  Precautions Precautions: Fall Restrictions Weight Bearing Restrictions Per Provider Order: No General:   Vital Signs: Therapy Vitals Temp: 98.2 F (36.8 C) Temp Source: Oral Resp: 16 BP: 118/79 Patient Position (if appropriate): Sitting Oxygen Therapy SpO2: 97 % O2 Device: Room Air Pain: Pain Assessment Pain Scale: 0-10 Pain Score: 3  Faces Pain Scale: Hurts a little bit Pain Type:  Acute pain Pain Location: Back Pain Orientation: Lower PAINAD (Pain Assessment in Advanced Dementia) Breathing: normal Negative Vocalization: none Facial Expression: smiling or inexpressive Body Language: relaxed Consolability: no need to console PAINAD Score: 0    Therapy/Group: Individual Therapy  Arrington Yohe 11/13/2023, 4:21 PM

## 2023-11-13 NOTE — IPOC Note (Signed)
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) [facility])  Hospital Problems: Principal Problem:   CIDP (chronic inflammatory demyelinating polyneuropathy) (HCC)     Functional Problem List: Nursing Pain, Endurance, Safety, Medication Management  PT Balance, Motor, Sensory, Perception, Endurance, Pain  OT Balance, Safety, Sensory, Skin Integrity, Endurance, Motor, Pain  SLP    TR         Basic ADL's: OT Eating, Grooming, Bathing, Dressing, Toileting     Advanced  ADL's: OT       Transfers: PT Bed Mobility, Bed to Chair, Furniture, Customer service manager, Research scientist (life sciences): PT Ambulation     Additional Impairments: OT None  SLP        TR      Anticipated Outcomes Item Anticipated Outcome  Self Feeding mod I  Swallowing      Basic self-care  mod I  Toileting  mod I   Bathroom Transfers mod I  Bowel/Bladder  n/a  Transfers  mod I  Locomotion  mod I  Communication     Cognition     Pain  < 4 with prns  Safety/Judgment  manage safety w cues   Therapy Plan: PT Intensity: Minimum of 1-2 x/day ,45 to 90 minutes PT Frequency: 5 out of 7 days PT Duration Estimated Length of Stay: 2 weeks OT Intensity: Minimum of 1-2 x/day, 45 to 90 minutes OT Frequency: 5 out of 7 days OT Duration/Estimated Length of Stay: 14-16 days     Team Interventions: Nursing Interventions Patient/Family Education, Disease Management/Prevention, Discharge Planning, Pain Management, Medication Management  PT interventions Ambulation/gait training, Warden/ranger, Community reintegration, Discharge planning, Disease management/prevention, DME/adaptive equipment instruction, Functional mobility training, Neuromuscular re-education, Pain management, Patient/family education, Splinting/orthotics, Stair training, Therapeutic Activities, Therapeutic  Exercise, UE/LE Strength taining/ROM, UE/LE Coordination activities, Visual/perceptual remediation/compensation, Wheelchair propulsion/positioning  OT Interventions Warden/ranger, Disease mangement/prevention, Neuromuscular re-education, Self Care/advanced ADL retraining, Therapeutic Exercise, DME/adaptive equipment instruction, Pain management, UE/LE Strength taining/ROM, Community reintegration, Equities trader education, UE/LE Coordination activities, Discharge planning, Functional mobility training, Psychosocial support, Therapeutic Activities, Visual/perceptual remediation/compensation, Skin care/wound managment, Splinting/orthotics, Functional electrical stimulation, Wheelchair propulsion/positioning  SLP Interventions    TR Interventions    SW/CM Interventions Discharge Planning, Psychosocial Support, Patient/Family Education   Barriers to Discharge MD  Medical stability  Nursing Decreased caregiver support mod I, wife assists with IADLs, recent hx of falls; ambulating with RW when not using RW  PT Decreased caregiver support house has 1 STE with no rails  OT Decreased caregiver support    SLP      SW Decreased caregiver support, Lack of/limited family support, Community education officer for SNF coverage     Team Discharge Planning: Destination: PT-Home ,OT- Home , SLP-  Projected Follow-up: PT-Outpatient PT, OT-  Outpatient OT, SLP-  Projected Equipment Needs: PT-To be determined, OT- To be determined, SLP-  Equipment Details: PT- , OT-owns rollator, TTB Patient/family involved in discharge planning: PT-  ,  OT-Patient, SLP-   MD ELOS: 14-16 days Medical Rehab Prognosis:  Excellent Assessment: The patient has been admitted for CIR therapies with the diagnosis of CIDP exacerbation . The team will be addressing functional mobility, strength, stamina, balance, safety, adaptive techniques and equipment, self-care, bowel and bladder mgt, patient and caregiver education. Goals have been set  at mod I. Anticipated discharge destination is home.        See Team Conference Notes for  weekly updates to the plan of care

## 2023-11-13 NOTE — Progress Notes (Signed)
Pt achieved a NIF of >-40 and was able to achieved a 2500 on IS with great pt effort on all attempts. MD notified, RT will monitor as needed.

## 2023-11-14 ENCOUNTER — Encounter (HOSPITAL_COMMUNITY): Payer: 59

## 2023-11-14 DIAGNOSIS — G8250 Quadriplegia, unspecified: Secondary | ICD-10-CM | POA: Diagnosis not present

## 2023-11-14 DIAGNOSIS — G6181 Chronic inflammatory demyelinating polyneuritis: Secondary | ICD-10-CM | POA: Diagnosis not present

## 2023-11-14 DIAGNOSIS — I10 Essential (primary) hypertension: Secondary | ICD-10-CM | POA: Diagnosis not present

## 2023-11-14 MED ORDER — IRBESARTAN 75 MG PO TABS
75.0000 mg | ORAL_TABLET | Freq: Every day | ORAL | Status: DC
  Administered 2023-11-15: 75 mg via ORAL
  Filled 2023-11-14: qty 1

## 2023-11-14 MED ORDER — AMLODIPINE BESYLATE 5 MG PO TABS
5.0000 mg | ORAL_TABLET | Freq: Every day | ORAL | Status: DC
  Administered 2023-11-15 – 2023-11-19 (×5): 5 mg via ORAL
  Filled 2023-11-14 (×5): qty 1

## 2023-11-14 NOTE — Progress Notes (Signed)
Pt achieved a NIF > -40 , an IS 2500 multiple times with great effort.

## 2023-11-14 NOTE — Progress Notes (Signed)
PROGRESS NOTE   Subjective/Complaints: Patient reports he feels like his breathing is stable.  Continues to complain that his feet feel very cold.  Patient states that the coldness is episodic mainly when he is in a wheelchair but not when he is in his bed.  He states that it also feels wet.   ROS: Patient denies fever, new vision changes, dizziness, nausea, vomiting, diarrhea,  shortness of breath or chest pain, headache. + cold feet + anxious -improved   Objective:   No results found. Recent Labs    11/12/23 0552  WBC 15.0*  HGB 13.9  HCT 41.5  PLT 412*   Recent Labs    11/12/23 0552  NA 137  K 3.8  CL 101  CO2 28  GLUCOSE 80  BUN 15  CREATININE 0.94  CALCIUM 8.9    Intake/Output Summary (Last 24 hours) at 11/14/2023 1225 Last data filed at 11/14/2023 1031 Gross per 24 hour  Intake 716 ml  Output 2250 ml  Net -1534 ml        Physical Exam: Vital Signs Blood pressure 135/87, pulse 78, temperature 98.1 F (36.7 C), temperature source Oral, resp. rate 18, height 5\' 7"  (1.702 m), weight 85.9 kg, SpO2 96%.   General: No acute distress Mood and affect are appropriate Heart: Regular rate and rhythm no rubs murmurs or extra sounds Lungs: Clear to auscultation, breathing unlabored, no rales or wheezes Abdomen: Positive bowel sounds, soft nontender to palpation, nondistended Extremities: No clubbing, cyanosis, or edema, 2+ pulses bilateral dorsalis pedis, feet are warm and dry no lesions, nails look good Skin: No evidence of breakdown, no evidence of rash  Neuro:  Decreased to light touch from elbows to finger tips B/L.  Talking in full sentences L>R Some muscle spasms seen in LE's- but not part of UMN syndrome - not seen today 1/30 Musculoskeletal:  General: Normal range of motion.     Cervical back: Neck supple. No tenderness.     Comments: RUE- biceps/triceps 4/5; WE 2-/5; Grip 3-/5 and FA 0/5 LUE-  biceps and triceps 4/5; WE 0/5; Grip 2+/5 and FA 1/5 RLE- HF 4-/5; KE 4/5; KF 4-/5; DF 2/5 and PF 2+/5 LLE- HF 4-/5; KE 4/5; KF 4-/5 DF 1/5 and PF 2-/5 A little flaccid in UEs  ESTIM in place on LUE Exam stable  Assessment/Plan: 1. Functional deficits which require 3+ hours per day of interdisciplinary therapy in a comprehensive inpatient rehab setting. Physiatrist is providing close team supervision and 24 hour management of active medical problems listed below. Physiatrist and rehab team continue to assess barriers to discharge/monitor patient progress toward functional and medical goals  Care Tool:  Bathing    Body parts bathed by patient: Right arm, Left lower leg, Face, Left arm, Chest, Abdomen, Front perineal area, Buttocks, Right upper leg, Left upper leg, Right lower leg         Bathing assist Assist Level: Supervision/Verbal cueing     Upper Body Dressing/Undressing Upper body dressing   What is the patient wearing?: Pull over shirt    Upper body assist Assist Level: Moderate Assistance - Patient 50 - 74%    Lower Body Dressing/Undressing  Lower body dressing      What is the patient wearing?: Underwear/pull up, Pants     Lower body assist Assist for lower body dressing: Maximal Assistance - Patient 25 - 49%     Toileting Toileting    Toileting assist Assist for toileting: Moderate Assistance - Patient 50 - 74%     Transfers Chair/bed transfer  Transfers assist     Chair/bed transfer assist level: Minimal Assistance - Patient > 75% Chair/bed transfer assistive device: Walker, Archivist   Ambulation assist      Assist level: Contact Guard/Touching assist Assistive device: Walker-rolling (bilateral hand splints) Max distance: 80   Walk 10 feet activity   Assist     Assist level: Contact Guard/Touching assist Assistive device: Walker-rolling   Walk 50 feet activity   Assist    Assist level: Minimal Assistance  - Patient > 75% Assistive device: Walker-rolling    Walk 150 feet activity   Assist Walk 150 feet activity did not occur:  (fatigue and imbalance)         Walk 10 feet on uneven surface  activity   Assist Walk 10 feet on uneven surfaces activity did not occur:  (fatigue and imbalance)         Wheelchair     Assist Is the patient using a wheelchair?: Yes Type of Wheelchair: Manual Wheelchair activity did not occur: Safety/medical concerns         Wheelchair 50 feet with 2 turns activity    Assist    Wheelchair 50 feet with 2 turns activity did not occur:  (fatigue and imbalance)       Wheelchair 150 feet activity     Assist  Wheelchair 150 feet activity did not occur:  (fatigue and imbalance)       Blood pressure 135/87, pulse 78, temperature 98.1 F (36.7 C), temperature source Oral, resp. rate 18, height 5\' 7"  (1.702 m), weight 85.9 kg, SpO2 96%.  Medical Problem List and Plan: 1. 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  Antithrombotics: -DVT/anticoagulation:  Pharmaceutical: Lovenox             -antiplatelet therapy: aspirin 81 mg daily   3. Pain Management: Tylenol as needed             -continue gabapentin 900 mg TID and 900 mg daily PRN             -continue Percocet 5/325 q 4 hours PRN  -Continue Cymbalta 60 mg daily   -Consider trial of Lyrica to replace gabapentin   4. Mood/Behavior/Sleep: LCSW to evaluate and provide emotional support             -continue Cymbalta 60 mg daily             -continue Zoloft 25 mg daily             -  continue clonazepam 0.5 mg BID prn             -antipsychotic agents:  n/a  -Patient requested increase of clonazepam due to anxiety.  He takes this medication chronically PDMP reviewed, will increase temporarily to 1 mg twice daily as needed  -1/31 pt reports anxiety is improved   5. Neuropsych/cognition: This patient is capable of making decisions on his own behalf.   6. Skin/Wound Care: Routine skin care checks   7. Fluids/Electrolytes/Nutrition: Routine Is and Os and follow-up chemistries   8: Hypertension: monitor TID and prn             -continue Avapro 300 mg daily             -continue Toprol-XL 25 mg daily  -1/31 controlled, continue current      11/14/2023    6:30 AM 11/13/2023    8:12 PM 11/13/2023    3:45 PM  Vitals with BMI  Systolic 135 108 573  Diastolic 87 68 79  Pulse 78 86   Will reduce losartan to 75 mg, start amlodipine 5 mg and monitor    9: Hypothyroidism: continue Synthroid 125 mcg daily   10: Tobacco use: cessation counseling -Nicorette gum as needed (not using)             -? 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 end date?) -follow-up Dr. Terrace Arabia -1/30 respiratory function- NIF >-40 and IS 2500, stable. Continue to monitor   12: Leukocytosis: likely steroid induced, improving; no fever              -follow-up CBC with differential Thursday 1/30 and q Monday  1/30 WBC down to 15.0, continue to monitor    Latest Ref Rng & Units 11/12/2023    5:52 AM 11/09/2023    7:03 AM 11/08/2023    6:59 AM  CBC  WBC 4.0 - 10.5 K/uL 15.0  15.7  16.1   Hemoglobin 13.0 - 17.0 g/dL 22.0  25.4  27.0   Hematocrit 39.0 - 52.0 % 41.5  40.7  40.7   Platelets 150 - 400 K/uL 412  341  332     13.  Elevated ALT and alk phos  -Avoid hepatotoxic medications.  Recheck Monday ordered    14. Cold feeling in b/l legs.  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  EXTR warm, pulses are normal, do not think he needs arterial Dopplers.  Likely autonomic, trial calcium channel blocker LOS: 4 days A FACE  TO FACE EVALUATION WAS PERFORMED  Erick Colace 11/14/2023, 12:25 PM

## 2023-11-14 NOTE — Plan of Care (Signed)
  Problem: Consults Goal: RH SPINAL CORD INJURY PATIENT EDUCATION Description:  See Patient Education module for education specifics.  Outcome: Progressing   Problem: RH SAFETY Goal: RH STG ADHERE TO SAFETY PRECAUTIONS W/ASSISTANCE/DEVICE Description: STG Adhere to Safety Precautions With  cues Assistance/Device. Outcome: Progressing   Problem: RH PAIN MANAGEMENT Goal: RH STG PAIN MANAGED AT OR BELOW PT'S PAIN GOAL Description: < 4 with prns Outcome: Progressing   Problem: RH KNOWLEDGE DEFICIT SCI Goal: RH STG INCREASE KNOWLEDGE OF SELF CARE AFTER SCI Description: Patient and spouse will be able to manage care at discharge using educational resources for medications and dietary modification independently.  Outcome: Progressing

## 2023-11-15 DIAGNOSIS — G6181 Chronic inflammatory demyelinating polyneuritis: Secondary | ICD-10-CM | POA: Diagnosis not present

## 2023-11-15 DIAGNOSIS — G8250 Quadriplegia, unspecified: Secondary | ICD-10-CM | POA: Diagnosis not present

## 2023-11-15 DIAGNOSIS — I10 Essential (primary) hypertension: Secondary | ICD-10-CM | POA: Diagnosis not present

## 2023-11-15 MED ORDER — ALBUTEROL SULFATE (2.5 MG/3ML) 0.083% IN NEBU: 2.5000 mg | INHALATION_SOLUTION | RESPIRATORY_TRACT | Status: DC | PRN

## 2023-11-15 NOTE — Progress Notes (Signed)
Physical Therapy Session Note  Patient Details  Name: Jonathan Luna MRN: [id number] Date of Birth: [birth year]  Today's Date: 11/15/2023 PT Individual Time: 1300-1345 PT Individual Time Calculation (min): 45 min   Short Term Goals: Week 1:  PT Short Term Goal 1 (Week 1): Pt will perform SBA sit<>stand with LRAD PT Short Term Goal 2 (Week 1): Pt will ambulate >160ft with two turns with SBA and LRAD PT Short Term Goal 3 (Week 1): Pt will perform curb step with CGA and LRAD PT Short Term Goal 3 - Progress (Week 1): Discontinued (comment)  Skilled Therapeutic Interventions/Progress Updates:      Therapy Documentation Precautions:  Precautions Precautions: Fall Restrictions Weight Bearing Restrictions Per Provider Order: No  Pt received seated at bedside, requesting assist with urinal management. Pt continent of bladder, demonstrates bilateral intention tremors and decreased grip of urinal. Pt rounded with MD, discussed pharmacological interventions and elevating leg rests to address B LE cold sensation. Pt dependent for w/c propulsion to main gym, PT assessed sensation to posterior knees (intact; diminished). PT attempted KT technique to posterior knee to enhance kinesthetic awareness. Observed no noticeable difference with hyperextension in gait with RW (min A) 10 ft x 2. Pt returned to room, left seated at bedside with all needs in reach.     Therapy/Group: Individual Therapy  Truitt Leep Truitt Leep PT, DPT  11/15/2023, 3:40 PM

## 2023-11-15 NOTE — Plan of Care (Signed)
  Problem: Consults Goal: RH SPINAL CORD INJURY PATIENT EDUCATION Description:  See Patient Education module for education specifics.  Outcome: Progressing   Problem: RH SAFETY Goal: RH STG ADHERE TO SAFETY PRECAUTIONS W/ASSISTANCE/DEVICE Description: STG Adhere to Safety Precautions With  cues Assistance/Device. Outcome: Progressing   Problem: RH PAIN MANAGEMENT Goal: RH STG PAIN MANAGED AT OR BELOW PT'S PAIN GOAL Description: < 4 with prns Outcome: Progressing   Problem: RH KNOWLEDGE DEFICIT SCI Goal: RH STG INCREASE KNOWLEDGE OF SELF CARE AFTER SCI Description: Patient and spouse will be able to manage care at discharge using educational resources for medications and dietary modification independently.  Outcome: Progressing

## 2023-11-15 NOTE — Progress Notes (Signed)
Physical Therapy Session Note  Patient Details  Name: Jonathan Luna MRN: [id number] Date of Birth: 24-Mar-1966  Today's Date: 11/15/2023 PT Individual Time: 0915-1015 PT Individual Time Calculation (min): 60 min   Short Term Goals: Week 1:  PT Short Term Goal 1 (Week 1): Pt will perform SBA sit<>stand with LRAD PT Short Term Goal 2 (Week 1): Pt will ambulate >121ft with two turns with SBA and LRAD PT Short Term Goal 3 (Week 1): Pt will perform curb step with CGA and LRAD PT Short Term Goal 3 - Progress (Week 1): Discontinued (comment)  Skilled Therapeutic Interventions/Progress Updates:    Pt presents in bed eager for session. Discussed his cold feet feeling and pt reporting it happens more in dependent position. Pt asking about elevating legrests - PT added these to his w/c and also made suggestion could sit up in recliner with elevating feature (pt does report it hurts his  back in the recliner but also agreeable to trial again in future).  Extra time and cues for safety with bed mobility while pt rolling to put on shorts and very close to edge - using momentum to swing himself more than controlled movements so required CGA overall for balance and safety. Total assist to don shoes with Bilat AFOs and socks. Squat pivot with mod assist needed for transfer with pt making several attempts and stating he just feels like he has no push in his legs. Propelled w/c to therapy x 150' with 1 rest break for functional mobility training and generalized UE strengthening endurance.  Min assist for stand step transfer with RW with cues again for safety and technique due to impaired coordination and quick larger movements. Focused on NMR for BLE strengthening, coordination, balance, and overall graded movements. Pt performed blocked practice sit <> stands with RW x 10 reps with focus then to break down transition to sit > partial stand for quad and glute activation with holding this position as well x 10 reps.  Lateral sidestepping to R and L x 5 reps each direction with focus on controlled movement, foot placement, and balance. I episode of R knee buckling.   Rest breaks needed between activities as quick to fatigue. Educated on importance of self monitoring, energy conservation, and allowing recover time - pt verbalized understanding.   End of session left up in w/c with all needs in reach and safety belt donned.   Therapy Documentation Precautions:  Precautions Precautions: Fall Restrictions Weight Bearing Restrictions Per Provider Order: No   Pain:  C/o more of bilateral foot  coldness and impaired sensation in BLE vs pain.      Therapy/Group: Individual Therapy  Karolee Stamps Darrol Poke, PT, DPT, CBIS  11/15/2023, 12:09 PM

## 2023-11-15 NOTE — Plan of Care (Signed)
  Problem: RH SAFETY Goal: RH STG ADHERE TO SAFETY PRECAUTIONS W/ASSISTANCE/DEVICE Description: STG Adhere to Safety Precautions With  cues Assistance/Device. Outcome: Progressing   Problem: RH PAIN MANAGEMENT Goal: RH STG PAIN MANAGED AT OR BELOW PT'S PAIN GOAL Description: < 4 with prns Outcome: Progressing   Problem: RH KNOWLEDGE DEFICIT SCI Goal: RH STG INCREASE KNOWLEDGE OF SELF CARE AFTER SCI Description: Patient and spouse will be able to manage care at discharge using educational resources for medications and dietary modification independently.  Outcome: Progressing

## 2023-11-15 NOTE — Progress Notes (Signed)
Nif > -40  VC 2.46L   Performed with great effort.

## 2023-11-15 NOTE — Progress Notes (Signed)
PROGRESS NOTE   Subjective/Complaints:  No cold or wet feeling today , has elevating leg rests today , also discussed switch to CCB  ROS: Patient denies fever, dizziness, nausea, vomiting, diarrhea,  shortness of breath or chest pain   Objective:   No results found. No results for input(s): "WBC", "HGB", "HCT", "PLT" in the last 72 hours.  No results for input(s): "NA", "K", "CL", "CO2", "GLUCOSE", "BUN", "CREATININE", "CALCIUM" in the last 72 hours.   Intake/Output Summary (Last 24 hours) at 11/15/2023 1251 Last data filed at 11/15/2023 1224 Gross per 24 hour  Intake 1520 ml  Output 1835 ml  Net -315 ml        Physical Exam: Vital Signs Blood pressure 131/76, pulse 88, temperature 98 F (36.7 C), temperature source Oral, resp. rate 16, height 5\' 7"  (1.702 m), weight 85.9 kg, SpO2 100%.   General: No acute distress Mood and affect are appropriate Heart: Regular rate and rhythm no rubs murmurs or extra sounds Lungs: Clear to auscultation, breathing unlabored, no rales or wheezes Abdomen: Positive bowel sounds, soft nontender to palpation, nondistended Extremities: No clubbing, cyanosis, or edema, 2+ pulses bilateral dorsalis pedis, feet are warm and dry no lesions, nails look good Skin: No evidence of breakdown, no evidence of rash  Neuro:  Decreased to light touch from elbows to finger tips B/L.  Talking in full sentences L>R Some muscle spasms seen in LE's- but not part of UMN syndrome - not seen today 1/30 Musculoskeletal:  General: Normal range of motion.     Cervical back: Neck supple. No tenderness.     Comments: RUE- biceps/triceps 4/5; WE 2-/5; Grip 3-/5 and FA 0/5 LUE- biceps and triceps 4/5; WE 0/5; Grip 2+/5 and FA 1/5 RLE- HF 4-/5; KE 4/5; KF 4-/5; DF 2/5 and PF 2+/5 LLE- HF 4-/5; KE 4/5; KF 4-/5 DF 1/5 and PF 2-/5 A little flaccid in UEs  ESTIM in place on LUE Exam stable  Assessment/Plan: 1.  Functional deficits which require 3+ hours per day of interdisciplinary therapy in a comprehensive inpatient rehab setting. Physiatrist is providing close team supervision and 24 hour management of active medical problems listed below. Physiatrist and rehab team continue to assess barriers to discharge/monitor patient progress toward functional and medical goals  Care Tool:  Bathing    Body parts bathed by patient: Right arm, Left lower leg, Face, Left arm, Chest, Abdomen, Front perineal area, Buttocks, Right upper leg, Left upper leg, Right lower leg         Bathing assist Assist Level: Supervision/Verbal cueing     Upper Body Dressing/Undressing Upper body dressing   What is the patient wearing?: Pull over shirt    Upper body assist Assist Level: Moderate Assistance - Patient 50 - 74%    Lower Body Dressing/Undressing Lower body dressing      What is the patient wearing?: Underwear/pull up, Pants     Lower body assist Assist for lower body dressing: Maximal Assistance - Patient 25 - 49%     Toileting Toileting    Toileting assist Assist for toileting: Moderate Assistance - Patient 50 - 74%     Transfers Chair/bed transfer  Transfers assist     Chair/bed transfer assist level: Moderate Assistance - Patient 50 - 74% Chair/bed transfer assistive device: Walker, Archivist   Ambulation assist      Assist level: Minimal Assistance - Patient > 75% Assistive device: Walker-rolling Max distance: 15   Walk 10 feet activity   Assist     Assist level: Minimal Assistance - Patient > 75% Assistive device: Walker-rolling   Walk 50 feet activity   Assist    Assist level: Minimal Assistance - Patient > 75% Assistive device: Walker-rolling    Walk 150 feet activity   Assist Walk 150 feet activity did not occur:  (fatigue and imbalance)         Walk 10 feet on uneven surface  activity   Assist Walk 10 feet on uneven  surfaces activity did not occur:  (fatigue and imbalance)         Wheelchair     Assist Is the patient using a wheelchair?: Yes Type of Wheelchair: Manual Wheelchair activity did not occur: Safety/medical concerns  Wheelchair assist level: Supervision/Verbal cueing Max wheelchair distance: 150    Wheelchair 50 feet with 2 turns activity    Assist    Wheelchair 50 feet with 2 turns activity did not occur:  (fatigue and imbalance)   Assist Level: Supervision/Verbal cueing   Wheelchair 150 feet activity     Assist  Wheelchair 150 feet activity did not occur:  (fatigue and imbalance)   Assist Level: Supervision/Verbal cueing   Blood pressure 131/76, pulse 88, temperature 98 F (36.7 C), temperature source Oral, resp. rate 16, height 5\' 7"  (1.702 m), weight 85.9 kg, SpO2 100%.  Medical Problem List and Plan: 1. 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  Antithrombotics: -DVT/anticoagulation:  Pharmaceutical: Lovenox             -antiplatelet therapy: aspirin 81 mg daily   3. Pain Management: Tylenol as needed             -continue gabapentin 900 mg TID and 900 mg daily PRN             -continue Percocet 5/325 q 4 hours PRN  -Continue Cymbalta 60 mg daily   -Consider trial of Lyrica to replace gabapentin   4. Mood/Behavior/Sleep: LCSW to evaluate and provide emotional support             -continue Cymbalta 60 mg daily             -continue Zoloft 25 mg daily             -continue clonazepam 0.5 mg BID prn             -antipsychotic agents: n/a  -Patient requested increase of clonazepam due to anxiety.  He takes this medication  chronically PDMP reviewed, will increase temporarily to 1 mg twice daily as needed  -  1/31 pt reports anxiety is improved   5. Neuropsych/cognition: This patient is capable of making decisions on his own behalf.   6. Skin/Wound Care: Routine skin care checks   7. Fluids/Electrolytes/Nutrition: Routine Is and Os and follow-up chemistries   8: Hypertension: monitor TID and prn             -continue Avapro 300 mg daily             -continue Toprol-XL 25 mg daily  -1/31 controlled, continue current      11/15/2023   12:24 PM 11/15/2023    4:22 AM 11/14/2023    1:46 PM  Vitals with BMI  Systolic 131 123 161  Diastolic 76 88 76  Pulse 88 72 90  2/1 Will reduce losartan to 75 mg, start amlodipine 5 mg and monitor BP controlled will try on amlodipine alone    9: Hypothyroidism: continue Synthroid 125 mcg daily   10: Tobacco use: cessation counseling -Nicorette gum as needed (not using)             -? 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 end date?) -follow-up Dr. Terrace Arabia -1/30 respiratory function- NIF >-40 and IS 2500, stable. Continue to monitor   12: Leukocytosis: likely steroid induced, improving; no fever              -follow-up CBC with differential Thursday 1/30 and q Monday  1/30 WBC down to 15.0, continue to monitor    Latest Ref Rng & Units 11/12/2023    5:52 AM 11/09/2023    7:03 AM 11/08/2023    6:59 AM  CBC  WBC 4.0 - 10.5 K/uL 15.0  15.7  16.1   Hemoglobin 13.0 - 17.0 g/dL 09.6  04.5  40.9   Hematocrit 39.0 - 52.0 % 41.5  40.7  40.7   Platelets 150 - 400 K/uL 412  341  332     13.  Elevated ALT and alk phos  -Avoid hepatotoxic medications.  Recheck Monday ordered    14. Cold feeling in b/l legs.  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  EXTR warm, pulses are normal, do not think he needs arterial Dopplers.  Likely autonomic, trial calcium channel blocker LOS: 5 days A FACE TO FACE EVALUATION WAS PERFORMED  Erick Colace 11/15/2023, 12:51 PM

## 2023-11-15 NOTE — Progress Notes (Signed)
Occupational Therapy Session Note  Patient Details  Name: MICHELL KADER MRN: [id number] Date of Birth: 1966-08-01  Today's Date: 11/15/2023 OT Individual Time: 4696-2952 and 1445-1538 OT Individual Time Calculation (min): 48 min and 53 min   Short Term Goals: Week 1:  OT Short Term Goal 1 (Week 1): Pt will complete grooming with SBA with AE as necessary OT Short Term Goal 2 (Week 1): Pt will complete LB dressing at Min A with AS as necessary OT Short Term Goal 3 (Week 1): Pt will complete donning/doffing wrist stabilizing u-cuff for feeding/grooming as necessary with SBA   Skilled Therapeutic Interventions/Progress Updates:    Session 1: Pt up in wheelchair at time of session, eager to participate. No pain but reports weakness being main limiting factor in rehab "all over." And Initial portion fo session answering pt questions, education throughout regarding return to work, potential adaptations that could be made (down the road), etc. Focus of session on NMR for L wrist using SAEBO for the following settings on level 10 for 8 minutes:  Saebo Stim One 330 pulse width 35 Hz pulse rate On 8 sec/ off 8 sec Ramp up/ down 2 sec Symmetrical Biphasic wave form Max intensity at 500 Ohm load   Pt performing various grasp/release patterns and AAROM with SAEBO for further NMR. Reutnred to room and call bell in reach all needs met.    Session 2: Pt greeted at time of session up in wheelchair very upset that this therapist was not there at time of scheduled OT session - note that schedule had been changed afte rhours and pt was unaware of change - able to be calmed down. Pt wanting to shower and wanted K tape removed - removed prior to shower. Wheelchair transfer wheelchair <> TTB in shower with MIN A and grab bar, OT assisting with shower as the pt was extremely fatigued and limitations in BUEs as well as time limitations - note pt could likely have assisted more given more time at next shower. OT  providing hand over hand and assist with sitting balance when applicable for bathign and dressing tasks, educated on threading LUE/LLE first given weaker/less mobile side. Partial stand pivot to bed with grab bar MIN A, set up all needs met alarm on.  Therapy Documentation Precautions:  Precautions Precautions: Fall Restrictions Weight Bearing Restrictions Per Provider Order: No    Therapy/Group: Individual Therapy  Erasmo Score 11/15/2023, 12:06 PM

## 2023-11-16 DIAGNOSIS — D72829 Elevated white blood cell count, unspecified: Secondary | ICD-10-CM | POA: Diagnosis not present

## 2023-11-16 DIAGNOSIS — R7401 Elevation of levels of liver transaminase levels: Secondary | ICD-10-CM | POA: Diagnosis not present

## 2023-11-16 DIAGNOSIS — I10 Essential (primary) hypertension: Secondary | ICD-10-CM | POA: Diagnosis not present

## 2023-11-16 DIAGNOSIS — G6181 Chronic inflammatory demyelinating polyneuritis: Secondary | ICD-10-CM | POA: Diagnosis not present

## 2023-11-16 LAB — CBC
HCT: 40.9 % (ref 39.0–52.0)
Hemoglobin: 13.8 g/dL (ref 13.0–17.0)
MCH: 31.2 pg (ref 26.0–34.0)
MCHC: 33.7 g/dL (ref 30.0–36.0)
MCV: 92.5 fL (ref 80.0–100.0)
Platelets: 397 10*3/uL (ref 150–400)
RBC: 4.42 MIL/uL (ref 4.22–5.81)
RDW: 12.9 % (ref 11.5–15.5)
WBC: 15 10*3/uL — ABNORMAL HIGH (ref 4.0–10.5)
nRBC: 0 % (ref 0.0–0.2)

## 2023-11-16 LAB — COMPREHENSIVE METABOLIC PANEL
ALT: 48 U/L — ABNORMAL HIGH (ref 0–44)
AST: 22 U/L (ref 15–41)
Albumin: 3.4 g/dL — ABNORMAL LOW (ref 3.5–5.0)
Alkaline Phosphatase: 32 U/L — ABNORMAL LOW (ref 38–126)
Anion gap: 8 (ref 5–15)
BUN: 16 mg/dL (ref 6–20)
CO2: 25 mmol/L (ref 22–32)
Calcium: 8.7 mg/dL — ABNORMAL LOW (ref 8.9–10.3)
Chloride: 106 mmol/L (ref 98–111)
Creatinine, Ser: 0.8 mg/dL (ref 0.61–1.24)
GFR, Estimated: >60 mL/min (ref >60)
Glucose, Bld: 86 mg/dL (ref 70–99)
Potassium: 4.1 mmol/L (ref 3.5–5.1)
Sodium: 139 mmol/L (ref 135–145)
Total Bilirubin: 0.5 mg/dL (ref 0.0–1.2)
Total Protein: 5.6 g/dL — ABNORMAL LOW (ref 6.5–8.1)

## 2023-11-16 NOTE — Plan of Care (Signed)
Pt is alert and oriented x 4. Weakness in extremities continue. Pt encouraged to keep exercising in bed. Meds whole. Tolerating diet. Receiving percocet for pain management. Pt has received 2 doses this shift. Vitals stable.  Problem: Consults Goal: RH SPINAL CORD INJURY PATIENT EDUCATION Description:  See Patient Education module for education specifics.  Outcome: Progressing   Problem: RH SAFETY Goal: RH STG ADHERE TO SAFETY PRECAUTIONS W/ASSISTANCE/DEVICE Description: STG Adhere to Safety Precautions With  cues Assistance/Device. Outcome: Progressing   Problem: RH PAIN MANAGEMENT Goal: RH STG PAIN MANAGED AT OR BELOW PT'S PAIN GOAL Description: < 4 with prns Outcome: Progressing   Problem: RH KNOWLEDGE DEFICIT SCI Goal: RH STG INCREASE KNOWLEDGE OF SELF CARE AFTER SCI Description: Patient and spouse will be able to manage care at discharge using educational resources for medications and dietary modification independently.  Outcome: Progressing

## 2023-11-16 NOTE — Progress Notes (Signed)
Physical Therapy Session Note  Patient Details  Name: Jonathan Luna MRN: [id number] Date of Birth: [birth year]  Today's Date: 11/16/2023 PT Individual Time: 0900-1005 PT Individual Time Calculation (min): 65 min   Short Term Goals: Week 1:  PT Short Term Goal 1 (Week 1): Pt will perform SBA sit<>stand with LRAD PT Short Term Goal 2 (Week 1): Pt will ambulate >176ft with two turns with SBA and LRAD PT Short Term Goal 3 (Week 1): Pt will perform curb step with CGA and LRAD PT Short Term Goal 3 - Progress (Week 1): Discontinued (comment)  Skilled Therapeutic Interventions/Progress Updates:      Therapy Documentation Precautions:  Precautions Precautions: Fall Restrictions Weight Bearing Restrictions Per Provider Order: No   Pt agreeable to PT session with emphasis on global strengthening and NMR for bilateral LE's as pt limited due to knee hyperextension and buckling with gait with bilateral PLS AFO's. PT dependently donned bilateral AFO's and socks, dependent transport to main gym. In parallel bars pt participated in blocked practiced of mini squats and standard squats totaling ~15 per each exercise. Pt performed modified lunges x 3 bilaterally with B UE support on rails. Pt transitioned to mat, mod A with squat pivot and performed elevated bridges 3 x 6 and single leg bridges x 3 bilaterally with min tactile cues. Pt returned to room and left seated at bedside, all needs in reach.     Therapy/Group: Individual Therapy  Truitt Leep Truitt Leep PT, DPT  11/16/2023, 3:13 PM

## 2023-11-16 NOTE — Progress Notes (Signed)
Occupational Therapy Session Note  Patient Details  Name: Jonathan Luna MRN: [id number] Date of Birth: 1966-07-01  Today's Date: 11/16/2023 OT Individual Time: 4696-2952 OT Individual Time Calculation (min): 72 min    Short Term Goals: Week 1:  OT Short Term Goal 1 (Week 1): Pt will complete grooming with SBA with AE as necessary OT Short Term Goal 2 (Week 1): Pt will complete LB dressing at Min A with AS as necessary OT Short Term Goal 3 (Week 1): Pt will complete donning/doffing wrist stabilizing u-cuff for feeding/grooming as necessary with SBA  Skilled Therapeutic Interventions/Progress Updates:      Therapy Documentation Precautions:  Precautions Precautions: Fall Restrictions Weight Bearing Restrictions Per Provider Order: No Session 1 General: "Hello!" Pt seated in W/C upon OT arrival, agreeable to OT.Pt session outdoors for increased QoL and mood.  Pain: no pain reported  Exercises: Pt using BLE tor W/C propulsion for increased leg strengthening. Pt completed lap around outdoor area with PRN rest breaks for fatigue. Pt then completed standing parches at rail with BUE Min A for balance and seated rest breaks in between trials.  Other Treatments: Saebo one on LUE wrist extensors at end of session to finish hour cycle.   Pt seated in W/C at end of session with W/C alarm donned, call light within reach and 4Ps assessed. OT assisted with donning RUE u-cuff for lunch time.   Session 2 General: "That was great!" Pt seated in W/C upon OT arrival, agreeable to OT. Pt session outdoors for increased QoL and mood.  Pain: no pain reported  ADL:Pt squat pivot into bed from W/C>bed with Min A and completed bed mobility at [facility] for ataxia management  Exercises: Pt completed the following exercise circuit in order to improve functional activity, strength and endurance to prepare for ADLs such as bathing. Pt completed the following exercises in seated/standing position with 1 noted LOB  (details below) and various repetitions on each exercise: -shoulder flexion hold until failure with 0.75# wrist weights -throw/catch with 1.1# ball with wrist weights for ataxia management -modified squats holding rail with BUE (1 LOB and Max A to correct back into W/C after bilateral knee buckle) -AAROM bicep curls on LUE and resistive triceps extension -seated toe taps with 3# ankle weights -seated marches with 3# ankle weights    Other Treatments: Saebo one on RUE wrist extensors at end of session to finish hour cycle.   Pt supine in bed with bed alarm activated, 2 bed rails up, call light within reach and 4Ps assessed.   Therapy/Group: Individual Therapy  Velia Meyer, OTD, OTR/L 11/16/2023, 4:46 PM

## 2023-11-16 NOTE — Progress Notes (Signed)
   11/16/23 1001  Spiritual Encounters  Type of Visit Initial  Care provided to: Patient  Reason for visit Routine spiritual support  OnCall Visit No   Visited patient per spiritual consult. Patient requested prayer. Had long discussion with patient about his condition and desire to return home. Patient concerned as to how his "disabling condition will" affect his home life. Patient's spouse is blind and patient is her caregiver, however now he is unable to care for her and needs care for himself. Reflectives listening - storytelling. Patient request return visit to discuss spiritual goals.

## 2023-11-16 NOTE — Progress Notes (Signed)
Physical Therapy Session Note  Patient Details  Name: Jonathan Luna MRN: [id number] Date of Birth: September 08, 1966  Today's Date: 11/16/2023 PT Individual Time: 1320-1345 PT Individual Time Calculation (min): 25 min   Short Term Goals: Week 1:  PT Short Term Goal 1 (Week 1): Pt will perform SBA sit<>stand with LRAD PT Short Term Goal 2 (Week 1): Pt will ambulate >155ft with two turns with SBA and LRAD PT Short Term Goal 3 (Week 1): Pt will perform curb step with CGA and LRAD PT Short Term Goal 3 - Progress (Week 1): Discontinued (comment)  Skilled Therapeutic Interventions/Progress Updates:      Treatment Session 1   Therapist present to make up missed minutes. Pt agreeable to therapy. Pt reports 6/10 LBP, premedicated. Therapist provided rest breaks and repositioning for pain management.   Pt performed sit to stand throughout session with RW and min A and increased time 2/2 R UE ataxia.   Pt performed the following for B LE strengthening, coordination:  standing marching 1x10 B Standing hip extension 2x5 B, verbal and tactile cues provided for upright posture, pt performed backwards stepping to chair with min A for management of RW, with 1 episode of L LE buckling.   Pt seated in WC at end of session with all needs within reach and bed alarm on.     Therapy Documentation Precautions:  Precautions Precautions: Fall Restrictions Weight Bearing Restrictions Per Provider Order: No General:   Vital Signs:   Pain: Pain Assessment Pain Scale: 0-10 Pain Score: 8  Mobility:   Locomotion :    Trunk/Postural Assessment :    Balance:   Exercises:   Other Treatments:      Therapy/Group: Individual Therapy  Ambrose Finland 11/16/2023, 1:34 PM

## 2023-11-16 NOTE — Progress Notes (Addendum)
Patient ID: Jonathan Luna, male   DOB: 12/12/1965, 58 y.o.   MRN: [id number] Met with pt who is interested in peer support and support groups when he leaves here. Have emailed kelly in regards to peer support. Will get this set up while here. Will also get community resources for support groups for this gentleman  3;11 PM Placed consent in soft chart for peer to peer

## 2023-11-16 NOTE — Progress Notes (Signed)
PROGRESS NOTE   Subjective/Complaints:  No acute events noted overnight.  Denies shortness of breath.  He would like to ask social work a few questions.  He reports his outpatient neurologist is planning IVIG, he is frustrated that this may be scheduled in March and not sooner.  He also wishes this infusion can be done at home.  ROS: Patient denies fever, chills, cough, dizziness, nausea, vomiting, diarrhea,  shortness of breath or chest pain + cold feet   Objective:   No results found. Recent Labs    11/16/23 0542  WBC 15.0*  HGB 13.8  HCT 40.9  PLT 397    Recent Labs    11/16/23 0542  NA 139  K 4.1  CL 106  CO2 25  GLUCOSE 86  BUN 16  CREATININE 0.80  CALCIUM 8.7*     Intake/Output Summary (Last 24 hours) at 11/16/2023 1637 Last data filed at 11/16/2023 1320 Gross per 24 hour  Intake 1798 ml  Output 2770 ml  Net -972 ml        Physical Exam: Vital Signs Blood pressure 121/88, pulse 76, temperature 97.9 F (36.6 C), resp. rate 18, height 5\' 7"  (1.702 m), weight 85.9 kg, SpO2 99%.   General: No acute distress, in wheelchair Mood and affect are appropriate Heart: Regular rate and rhythm no rubs murmurs or extra sounds Lungs: Clear to auscultation, breathing unlabored, no rales or wheezes, on room air Abdomen: Positive bowel sounds, soft nontender to palpation, nondistended Extremities: No clubbing, cyanosis, or edema, 2+ pulses bilateral dorsalis pedis, nails look good, feet and legs feel a little cool Skin: No evidence of breakdown, no evidence of rash  Neuro:  Decreased to light touch from elbows to finger tips B/L.  Talking in full sentences L>R Musculoskeletal:  General: Normal range of motion.     Cervical back: Neck supple. No tenderness.     Comments: RUE- biceps/triceps 4/5; WE 2-/5; Grip 3-/5 and FA 0/5 LUE- biceps and triceps 4/5; WE 0/5; Grip 2+/5 and FA 1/5 RLE- HF 4-/5; KE 4/5; KF 4-/5;  DF 2/5 and PF 2+/5 LLE- HF 4-/5; KE 4/5; KF 4-/5 DF 1/5 and PF 2-/5 A little flaccid in UEs   Exam stable 2/3  Assessment/Plan: 1. Functional deficits which require 3+ hours per day of interdisciplinary therapy in a comprehensive inpatient rehab setting. Physiatrist is providing close team supervision and 24 hour management of active medical problems listed below. Physiatrist and rehab team continue to assess barriers to discharge/monitor patient progress toward functional and medical goals  Care Tool:  Bathing    Body parts bathed by patient: Right arm, Left lower leg, Face, Left arm, Chest, Abdomen, Front perineal area, Buttocks, Right upper leg, Left upper leg, Right lower leg         Bathing assist Assist Level: Supervision/Verbal cueing     Upper Body Dressing/Undressing Upper body dressing   What is the patient wearing?: Pull over shirt    Upper body assist Assist Level: Moderate Assistance - Patient 50 - 74%    Lower Body Dressing/Undressing Lower body dressing      What is the patient wearing?: Underwear/pull up, Pants  Lower body assist Assist for lower body dressing: Maximal Assistance - Patient 25 - 49%     Toileting Toileting    Toileting assist Assist for toileting: Moderate Assistance - Patient 50 - 74%     Transfers Chair/bed transfer  Transfers assist     Chair/bed transfer assist level: Moderate Assistance - Patient 50 - 74% Chair/bed transfer assistive device: Walker, Archivist   Ambulation assist      Assist level: Minimal Assistance - Patient > 75% Assistive device: Walker-rolling Max distance: 15   Walk 10 feet activity   Assist     Assist level: Minimal Assistance - Patient > 75% Assistive device: Walker-rolling   Walk 50 feet activity   Assist    Assist level: Minimal Assistance - Patient > 75% Assistive device: Walker-rolling    Walk 150 feet activity   Assist Walk 150 feet  activity did not occur:  (fatigue and imbalance)         Walk 10 feet on uneven surface  activity   Assist Walk 10 feet on uneven surfaces activity did not occur:  (fatigue and imbalance)         Wheelchair     Assist Is the patient using a wheelchair?: Yes Type of Wheelchair: Manual Wheelchair activity did not occur: Safety/medical concerns  Wheelchair assist level: Supervision/Verbal cueing Max wheelchair distance: 150    Wheelchair 50 feet with 2 turns activity    Assist    Wheelchair 50 feet with 2 turns activity did not occur:  (fatigue and imbalance)   Assist Level: Supervision/Verbal cueing   Wheelchair 150 feet activity     Assist  Wheelchair 150 feet activity did not occur:  (fatigue and imbalance)   Assist Level: Supervision/Verbal cueing   Blood pressure 121/88, pulse 76, temperature 97.9 F (36.6 C), resp. rate 18, height 5\' 7"  (1.702 m), weight 85.9 kg, SpO2 99%.  Medical Problem List and Plan: 1. 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  Antithrombotics: -DVT/anticoagulation:  Pharmaceutical: Lovenox             -antiplatelet therapy: aspirin 81 mg daily   3. Pain Management: Tylenol as needed             -continue gabapentin 900 mg TID and 900 mg daily PRN             -continue Percocet 5/325 q 4 hours PRN  -Continue Cymbalta 60 mg daily   -Consider trial of Lyrica to replace gabapentin   4. Mood/Behavior/Sleep: LCSW to evaluate and provide emotional support             -continue Cymbalta 60 mg daily             -continue Zoloft 25 mg  daily             -  continue clonazepam 0.5 mg BID prn             -antipsychotic agents: n/a  -Patient requested increase of clonazepam due to anxiety.  He takes this medication chronically PDMP reviewed, will increase temporarily to 1 mg twice daily as needed  -1/31 pt reports anxiety is improved   5. Neuropsych/cognition: This patient is capable of making decisions on his own behalf.   6. Skin/Wound Care: Routine skin care checks   7. Fluids/Electrolytes/Nutrition: Routine Is and Os and follow-up chemistries   8: Hypertension: monitor TID and prn             -continue Avapro 300 mg daily             -continue Toprol-XL 25 mg daily  -1/31 controlled, continue current      11/16/2023    4:29 AM 11/15/2023    7:19 PM 11/15/2023    2:06 PM  Vitals with BMI  Systolic 121 101 604  Diastolic 88 76 71  Pulse 76 91 86  2/1 Will reduce losartan to 75 mg, start amlodipine 5 mg and monitor- see no. 14 BP controlled will try on amlodipine alone 2/3 BP controlled continue current regimen   9: Hypothyroidism: continue Synthroid 125 mcg daily   10: Tobacco use: cessation counseling -Nicorette gum as needed (not using)             -? 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 end date?) -follow-up Dr. Terrace Arabia -1/30 respiratory function- NIF >-40 and IS 2500, stable. Continue to monitor -2/3 NIF/IS stable   12: Leukocytosis: likely steroid induced, improving; no fever              -follow-up CBC with differential Thursday 1/30 and q Monday  1/3 WBC elevated but stable at 15,  likely steroid related    Latest Ref Rng & Units 11/16/2023    5:42 AM 11/12/2023    5:52 AM 11/09/2023    7:03 AM  CBC  WBC 4.0 - 10.5 K/uL 15.0  15.0  15.7   Hemoglobin 13.0 - 17.0 g/dL 54.0  98.1  19.1   Hematocrit 39.0 - 52.0 % 40.9  41.5  40.7   Platelets 150 - 400 K/uL 397  412  341     13.  Elevated ALT  -2/3 improved to 48 today    14. Cold feeling  in b/l legs.  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  EXTR warm, pulses are normal, do not think he needs arterial Dopplers.  Likely autonomic, trial calcium channel blocker  LOS: 6 days A FACE TO FACE EVALUATION WAS PERFORMED  Fanny Dance 11/16/2023, 4:37 PM

## 2023-11-16 NOTE — Progress Notes (Signed)
NIF greater than -40 VC- unable to obtain reading on the [employer] x 2 attempts, however pt was able to do 2500 ml on IS

## 2023-11-17 ENCOUNTER — Ambulatory Visit: Payer: 59

## 2023-11-17 ENCOUNTER — Telehealth: Payer: Self-pay

## 2023-11-17 ENCOUNTER — Ambulatory Visit: Payer: 59 | Admitting: Occupational Therapy

## 2023-11-17 DIAGNOSIS — G6181 Chronic inflammatory demyelinating polyneuritis: Secondary | ICD-10-CM | POA: Diagnosis not present

## 2023-11-17 MED ORDER — IMMUNE GLOBULIN (HUMAN) 10 GM/100ML IV SOLN
0.5000 g/kg | Freq: Every day | INTRAVENOUS | Status: AC
  Administered 2023-11-17: 35 g via INTRAVENOUS
  Administered 2023-11-18: 30 g via INTRAVENOUS
  Administered 2023-11-19 – 2023-11-20 (×2): 35 g via INTRAVENOUS
  Filled 2023-11-17 (×6): qty 350

## 2023-11-17 MED ORDER — PREDNISONE 20 MG PO TABS
60.0000 mg | ORAL_TABLET | Freq: Every day | ORAL | Status: DC
  Administered 2023-11-17 – 2023-12-08 (×22): 60 mg via ORAL
  Filled 2023-11-17 (×22): qty 3

## 2023-11-17 NOTE — Progress Notes (Signed)
 PROGRESS NOTE   Subjective/Complaints:  I remember pt was supposed to be on 60 mg Prednisone - not sure when got moved.  Changed back Less side effects from prednisone  lately- less irritable.   Trying to get hands back- using estim, but not really making gains- also legs weaker than they've ever been.  Per pt, Dr Onita wants IVIG for pt.  Also N/T less in abdomenal area.     ROS:   Pt denies SOB, abd pain, CP, N/V/C/D, and vision changes  + cold feet   Objective:   No results found. Recent Labs    11/16/23 0542  WBC 15.0*  HGB 13.8  HCT 40.9  PLT 397    Recent Labs    11/16/23 0542  NA 139  K 4.1  CL 106  CO2 25  GLUCOSE 86  BUN 16  CREATININE 0.80  CALCIUM  8.7*     Intake/Output Summary (Last 24 hours) at 11/17/2023 0951 Last data filed at 11/17/2023 0700 Gross per 24 hour  Intake 1260 ml  Output 2950 ml  Net -1690 ml        Physical Exam: Vital Signs Blood pressure (!) 129/90, pulse 70, temperature 97.6 F (36.4 C), temperature source Oral, resp. rate 18, height 5' 7 (1.702 m), weight 85.9 kg, SpO2 100%.    General: awake, alert, appropriate, sitting up in bed; NAD HENT: conjugate gaze; oropharynx moist CV: regular rate and rhythm; no JVD Pulmonary: CTA B/L; no W/R/R- good air movement GI: soft, NT, ND, (+)BS- normoactive Psychiatric: appropriate Neurological: Ox3 Cannot lift wrists/grasp against gravity- at max 2- to 2/5- Also legs barely antigravity, but really using glutes to lift at hip Skin: No evidence of breakdown, no evidence of rash  Neuro:  Decreased to light touch from elbows to finger tips B/L.  Talking in full sentences L>R Musculoskeletal:  General: Normal range of motion.     Cervical back: Neck supple. No tenderness.     Comments: RUE- biceps/triceps 4/5; WE 2-/5; Grip 3-/5 and FA 0/5 LUE- biceps and triceps 4/5; WE 0/5; Grip 2+/5 and FA 1/5 RLE- HF 4-/5; KE 4/5; KF  4-/5; DF 2/5 and PF 2+/5 LLE- HF 4-/5; KE 4/5; KF 4-/5 DF 1/5 and PF 2-/5 A little flaccid in UEs   Exam stable 2/3  Assessment/Plan: 1. Functional deficits which require 3+ hours per day of interdisciplinary therapy in a comprehensive inpatient rehab setting. Physiatrist is providing close team supervision and 24 hour management of active medical problems listed below. Physiatrist and rehab team continue to assess barriers to discharge/monitor patient progress toward functional and medical goals  Care Tool:  Bathing    Body parts bathed by patient: Right arm, Left lower leg, Face, Left arm, Chest, Abdomen, Front perineal area, Buttocks, Right upper leg, Left upper leg, Right lower leg         Bathing assist Assist Level: Supervision/Verbal cueing     Upper Body Dressing/Undressing Upper body dressing   What is the patient wearing?: Pull over shirt    Upper body assist Assist Level: Moderate Assistance - Patient 50 - 74%    Lower Body Dressing/Undressing Lower body dressing  What is the patient wearing?: Underwear/pull up, Pants     Lower body assist Assist for lower body dressing: Maximal Assistance - Patient 25 - 49%     Toileting Toileting    Toileting assist Assist for toileting: Moderate Assistance - Patient 50 - 74%     Transfers Chair/bed transfer  Transfers assist     Chair/bed transfer assist level: Moderate Assistance - Patient 50 - 74% Chair/bed transfer assistive device: Walker, Archivist   Ambulation assist      Assist level: Minimal Assistance - Patient > 75% Assistive device: Walker-rolling Max distance: 15   Walk 10 feet activity   Assist     Assist level: Minimal Assistance - Patient > 75% Assistive device: Walker-rolling   Walk 50 feet activity   Assist    Assist level: Minimal Assistance - Patient > 75% Assistive device: Walker-rolling    Walk 150 feet activity   Assist Walk 150 feet  activity did not occur:  (fatigue and imbalance)         Walk 10 feet on uneven surface  activity   Assist Walk 10 feet on uneven surfaces activity did not occur:  (fatigue and imbalance)         Wheelchair     Assist Is the patient using a wheelchair?: Yes Type of Wheelchair: Manual Wheelchair activity did not occur: Safety/medical concerns  Wheelchair assist level: Supervision/Verbal cueing Max wheelchair distance: 150    Wheelchair 50 feet with 2 turns activity    Assist    Wheelchair 50 feet with 2 turns activity did not occur:  (fatigue and imbalance)   Assist Level: Supervision/Verbal cueing   Wheelchair 150 feet activity     Assist  Wheelchair 150 feet activity did not occur:  (fatigue and imbalance)   Assist Level: Supervision/Verbal cueing   Blood pressure (!) 129/90, pulse 70, temperature 97.6 F (36.4 C), temperature source Oral, resp. rate 18, height 5' 7 (1.702 m), weight 85.9 kg, SpO2 100%.  Medical Problem List and Plan: 1. 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   - team conference to determine length of stay  -Bilateral resting hand splints  -Social work provided some information on peers support groups  2.  Antithrombotics: -DVT/anticoagulation:  Pharmaceutical: Lovenox              -antiplatelet therapy: aspirin  81 mg daily   3. Pain Management: Tylenol  as needed             -continue gabapentin  900 mg TID and 900 mg daily PRN             -continue Percocet 5/325 q 4 hours PRN  -Continue Cymbalta  60 mg daily   -Consider trial of Lyrica to replace gabapentin    4. Mood/Behavior/Sleep: LCSW to evaluate and provide  emotional support             -continue Cymbalta  60 mg daily             -  continue Zoloft  25 mg daily             -continue clonazepam  0.5 mg BID prn             -antipsychotic agents: n/a  -Patient requested increase of clonazepam  due to anxiety.  He takes this medication chronically PDMP reviewed, will increase temporarily to 1 mg twice daily as needed  -1/31 pt reports anxiety is improved   2/4- pt reports anxiety is better due to Prednisone  same with irritability.  5. Neuropsych/cognition: This patient is capable of making decisions on his own behalf.   6. Skin/Wound Care: Routine skin care checks   7. Fluids/Electrolytes/Nutrition: Routine Is and Os and follow-up chemistries   8: Hypertension: monitor TID and prn             -continue Avapro  300 mg daily             -continue Toprol -XL 25 mg daily  -1/31 controlled, continue current      11/17/2023    4:37 AM 11/16/2023    7:52 PM 11/16/2023    2:28 PM  Vitals with BMI  Systolic 129 118 885  Diastolic 90 75 78  Pulse 70 81 83  2/1 Will reduce losartan to 75 mg, start amlodipine  5 mg and monitor- see no. 14 BP controlled will try on amlodipine  alone 2/3 BP controlled continue current regimen   2/4- BP a little elevated DBP- at 90, but otherwise well controlled- con't regimen 9: Hypothyroidism: continue Synthroid  125 mcg daily   10: Tobacco use: cessation counseling -Nicorette  gum as needed (not using)             -? cough>> on scheduled Mucinex -DM BID   11: CIDP -continue prednisone  40 mg daily -daily VC and NIF (? end date?) -follow-up Dr. Onita -1/30 respiratory function- NIF >-40 and IS 2500, stable. Continue to monitor -2/3 NIF/IS stable   2/4- NIF's VC OK- on ICS actually- spoke with Dr Onita- she wants us  to do another dosing of IVIG over a total of 4 days- 2g/kg total dose over 4 days- will arrange with Pharmacy- spoke with them as well- will also increase Prednisone  to 60 mg daily- since I think it was decreased-  supposed to be on until f/u with Dr Onita- needs BMP daily per pharmacy while on IVIG.   12: Leukocytosis: likely steroid induced, improving; no fever              -follow-up CBC with differential Thursday 1/30 and q Monday  1/3 WBC elevated but stable at 15,  likely steroid related    Latest Ref Rng & Units 11/16/2023    5:42 AM 11/12/2023    5:52 AM 11/09/2023    7:03 AM  CBC  WBC 4.0 - 10.5 K/uL 15.0  15.0  15.7   Hemoglobin 13.0 - 17.0 g/dL 86.1  86.0  86.2   Hematocrit 39.0 - 52.0 % 40.9  41.5  40.7   Platelets 150 - 400 K/uL 397  412  341     13.  Elevated ALT  -2/3 improved to 48 today    14. Cold feeling in b/l legs.  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  EXTR warm, pulses are normal, do not think he needs arterial Dopplers.  Likely autonomic, trial calcium  channel blocker   I spent a total of 57   minutes on total care today- >50% coordination of care- due to  D/w Neuro as well as pharmacy and PA about IVIG  and getting arranged- also increased prednisone  to 60 mg daily and team conference to determine length of stay   LOS: 7 days A FACE TO FACE EVALUATION WAS PERFORMED  Kyian Obst 11/17/2023, 9:51 AM

## 2023-11-17 NOTE — Progress Notes (Signed)
 Physical Therapy Session Note  Patient Details  Name: Jonathan Luna MRN: [id number] Date of Birth: 1965/12/11  Today's Date: 11/17/2023 PT Individual Time: 1423-1536 PT Individual Time Calculation (min): 73 min   Short Term Goals: Week 1:  PT Short Term Goal 1 (Week 1): Pt will perform SBA sit<>stand with LRAD PT Short Term Goal 2 (Week 1): Pt will ambulate >172ft with two turns with SBA and LRAD PT Short Term Goal 3 (Week 1): Pt will perform curb step with CGA and LRAD PT Short Term Goal 3 - Progress (Week 1): Discontinued (comment)  Skilled Therapeutic Interventions/Progress Updates:      Therapy Documentation Precautions:  Precautions Precautions: Fall Restrictions Weight Bearing Restrictions Per Provider Order: No  Pt received semi-reclined in bed with IVIG running. Pt without reports of pain and PT utilized therapeutic use of self to provide support and encouragement as pt expressed concerns over current setbacks. PT explained peer mentor support and pt agreeable and states he has found chaplin support to be beneficial. Pt educated on strength deficits and PT explained difference along with video on concentric vs. Eccentric strength. Pt with deficits with eccentric quad + hamstrings along with diminished strength with active concentric hamstring activation weightbearing. Pt performed blocked practice LAQ with emphasis on slowly lowering ~5 seconds bilaterally. Pt transitioned to prone and performed hamstring curls with emphasis on eccentric lowering as well. Pt left semi-reclined in bed, all needs in reach, alarm on.     Therapy/Group: Individual Therapy  [name] [name] PT, DPT  11/17/2023, 3:51 PM

## 2023-11-17 NOTE — Progress Notes (Signed)
 Patient ID: Jonathan Luna, male   DOB: 1966-05-21, 58 y.o.   MRN: [id number]  Met with pt to go over team conference update goals of mod/I if able to reach since has declined since admission. Target discharge date of 2/25. Pt is aware has declined and is receiving IVIG for the next four days, hoping this will help it did last time. Pt will ned to be at a level  where he is safe home alone while his wife works. Will start pursuing home health since might not be at a level he can get to OP. He is agreeable to this

## 2023-11-17 NOTE — Progress Notes (Addendum)
Patient was not available first thing this morning, just attempted again and patient was OTF. Will attempt NIF/VC again at a later time.

## 2023-11-17 NOTE — Plan of Care (Signed)
  Problem: RH SAFETY Goal: RH STG ADHERE TO SAFETY PRECAUTIONS W/ASSISTANCE/DEVICE Description: STG Adhere to Safety Precautions With  cues Assistance/Device. Outcome: Progressing   Problem: RH PAIN MANAGEMENT Goal: RH STG PAIN MANAGED AT OR BELOW PT'S PAIN GOAL Description: < 4 with prns Outcome: Progressing   Problem: RH KNOWLEDGE DEFICIT SCI Goal: RH STG INCREASE KNOWLEDGE OF SELF CARE AFTER SCI Description: Patient and spouse will be able to manage care at discharge using educational resources for medications and dietary modification independently.  Outcome: Progressing

## 2023-11-17 NOTE — Progress Notes (Signed)
 Occupational Therapy Session Note  Patient Details  Name: Jonathan Luna MRN: [id number] Date of Birth: 11-25-1965  Today's Date: 11/17/2023 OT Individual Time: 9154-9069 & 1345-1415 OT Individual Time Calculation (min): 45 min & 30 min    Short Term Goals: Week 1:  OT Short Term Goal 1 (Week 1): Pt will complete grooming with SBA with AE as necessary OT Short Term Goal 2 (Week 1): Pt will complete LB dressing at Min A with AS as necessary OT Short Term Goal 3 (Week 1): Pt will complete donning/doffing wrist stabilizing u-cuff for feeding/grooming as necessary with SBA  Skilled Therapeutic Interventions/Progress Updates:      Therapy Documentation Precautions:  Precautions Precautions: Fall Restrictions Weight Bearing Restrictions Per Provider Order: No  Session 1 General: "I need a shower" Pt supine in bed upon OT arrival, agreeable to OT session.  Pain: muscle tightness reported in bilateral calves, activity, intermittent rest breaks, distractions provided for pain management, pt reports tolerable to proceed.   ADL: Bed mobility: CGA for ataxia management, supine on flat bed>EOB Grooming: Mod A, required assistance opening containers and applying deodorant Toilet transfer: ambulating with RW Min A from bed>toilet, no knee buckling noted Toileting: Mod A, required some assistance with pants over waist and thoroughness for hygiene after BM, pt able to complete most of peri hygiene UB dressing: Min A, assistance down back, increased time required  LB dressing: Max A, assistance over waist and over feet, seated for threading, standing at grab bar for over waist Footwear: total A for B AFO and socks Shower transfer: required multiple attempts to stand d/t bilateral buckling, when standing, Mod A squat pivot from TTB>W/C Bathing: Min A, seated on TTB, assistance squeezing bottles    Pt seated in W/C at end of session with W/C alarm donned, call light within reach and 4Ps assessed.     Session 2 General: "I am happy I have this again" Pt supine in bed upon OT arrival, agreeable to OT session. Pt on IVIg tx during session.  Pain: no pain reported  Other Treatments: Pt educated on 15/7 and therapy tx on IV d/t pt confusion. Pt educated he will still receive therapy with IV d/t pt concerned he would not. Pt and OT discussed joining support groups and started searching for potential support groups in area. OT discussed dynamic wrist extension splinting option for pt d/t BUE wrist drop and increased independence if used    Pt supine in bed with bed alarm activated, 2 bed rails up, call light within reach and 4Ps assessed. Nurse present.    Therapy/Group: Individual Therapy  Camie Hoe, OTD, OTR/L 11/17/2023, 4:39 PM

## 2023-11-17 NOTE — Telephone Encounter (Signed)
Per [facility] in infusion, the hospital says they will start patient's loading dose of IVIG if orders are placed for it by Dr. Terrace Arabia in Epic. He will be admitted for 3 weeks.

## 2023-11-17 NOTE — Patient Care Conference (Signed)
 Inpatient RehabilitationTeam Conference and Plan of Care Update Date: 11/17/2023   Time: 11:38 AM    Patient Name: Jonathan Luna      Medical Record Number: [id number]  Date of Birth: Mar 07, 1966 Sex: Male         Room/Bed: 4W10C/4W10C-01 Payor Info: Payor: ADVERTISING COPYWRITER / Plan: ADVERTISING COPYWRITER OTHER / Product Type: *No Product type* /    Admit Date/Time:  11/10/2023  1:45 PM  Primary Diagnosis:  CIDP (chronic inflammatory demyelinating polyneuropathy) [facility])  Hospital Problems: Principal Problem:   CIDP (chronic inflammatory demyelinating polyneuropathy) [facility])    Expected Discharge Date: Expected Discharge Date: 12/08/23  Team Members Present: Physician leading conference: Dr. Duwaine Barrs Social Worker Present: Rhoda Clement, LCSW Nurse Present: Barnie Ronde, RN;Isrrael Fluckiger [place], RN PT Present: [name], PT OT Present: Camie Hoe, OT PPS Coordinator present : Burnard Mealing, OT     Current Status/Progress Goal Weekly Team Focus  Bowel/Bladder   Patient continent of bowel and bladder   remain continent   Assist with toileting qshift and prn    Swallow/Nutrition/ Hydration               ADL's   Grooming: Mod A, required assistance opening containers and applying deodorant  Toilet transfer: ambulating with RW Min A from bed>toilet, no knee buckling noted  Toileting: Mod A, required some assistance with pants over waist and thoroughness for hygiene after BM, pt able to complete most of peri hygiene  UB dressing: Min A, assistance down back, increased time required   LB dressing: Max A, assistance over waist and over feet, seated for threading, standing at grab bar for over waist  Footwear: total A for B AFO and socks  Shower transfer: required multiple attempts to stand d/t bilateral buckling, when standing, Mod A squat pivot from TTB>W/C  Bathing: Min A, seated on TTB, assistance squeezing bottles   mod I   potential splinting for BUE, transfers,  strength/conditioning,ataxia management    Mobility   SBA bed mobility, CGA sit<>stand, min gait ~40 ft with RW   Mod I  LE strength to improve stability with gait    Communication                Safety/Cognition/ Behavioral Observations               Pain   Patient c/o of aching pain in lower back. Score 8 out of 10. prn med given (see mar)   <3 pain score   Assess q shift and prn    Skin   skin intact   maintain skin integrity  Assess q shift and prn      Discharge Planning:  Home with wife who is blind and currently working during the day. Was going to [facility] OP prior to admission   Team Discussion: CIDP.  Watching blood pressures. ALT level improving. Back and bilateral lower extremity pain managed with scheduled and PRN medications. Stand by assist with bed mobility. Gait Min assist with RW. Mod assist with grooming and toileting with bilateral knee buckling and hyperextension noted. Max assist with shower today. Max assist with lower body dressing. Total assist to don/doff bilateral AFO/splints.  Working on print production planner, transfers, education, and energy manager.  Goals currently Mod I. Patient on target to meet rehab goals: Estimated discharge date of 12/08/2023  *See Care Plan and progress notes for long and short-term goals.   Revisions to Treatment Plan:  Start IVIG x 4 days.  Increase Prednisone .  BMP daily while taking IVIG. Consider bilateral knee braces with dynamic extension wrist splints. Move therapy to 15/7 until 11/23/2023. Monitor labs and VS Teaching Needs: Medications, safety, self care, transfers, toileting, skin care, etc.   Current Barriers to Discharge: Decreased caregiver support and Insurance for SNF coverage  Possible Resolutions to Barriers: Family education Home VS SNF Order recommended DME      Medical Summary Current Status: conitnent B/B- back and leg pain- and nubness and cold feeling-  Barriers to Discharge:  Behavior/Mood;Medical stability;Self-care education;Uncontrolled Pain;Other (comments)  Barriers to Discharge Comments: severe weakness- esp wrist and hands- B/L knee buckling and hyperextension- needs braces?- Possible Resolutions to [employer] Focus: starting IVIG- x 4 days and increased prednisone  60 mg daily- and needs braces/estim- dynamic extension splints- already has resting hand splints- but going to get dynamic extension wrist splints?- max A- getting worse- 2/25- will move to 15/7- due to IVIG   Continued Need for Acute Rehabilitation Level of Care: The patient requires daily medical management by a physician with specialized training in physical medicine and rehabilitation for the following reasons: Direction of a multidisciplinary physical rehabilitation program to maximize functional independence : Yes Medical management of patient stability for increased activity during participation in an intensive rehabilitation regime.: Yes Analysis of laboratory values and/or radiology reports with any subsequent need for medication adjustment and/or medical intervention. : Yes   I attest that I was present, lead the team conference, and concur with the assessment and plan of the team.   Darice LITTIE Boring 11/18/2023, 10:28 AM

## 2023-11-17 NOTE — Progress Notes (Signed)
 Physical Therapy Session Note  Patient Details  Name: Jonathan Luna MRN: [id number] Date of Birth: Jun 21, 1966  Today's Date: 11/17/2023 PT Individual Time: 1100-1200 PT Individual Time Calculation (min): 60 min   Short Term Goals: Week 1:  PT Short Term Goal 1 (Week 1): Pt will perform SBA sit<>stand with LRAD PT Short Term Goal 2 (Week 1): Pt will ambulate >164ft with two turns with SBA and LRAD PT Short Term Goal 3 (Week 1): Pt will perform curb step with CGA and LRAD PT Short Term Goal 3 - Progress (Week 1): Discontinued (comment)  Skilled Therapeutic Interventions/Progress Updates:      Pt lying in bed with RN finishing up PIV in L forearm. Pt reports chronic lower back pain, unrated in nature. Rest breaks and mobility provided for management as needed but no c/o during treatment.   Bed mobility completed with supervision using hospital bed features. Pt completed squat pivot transfer at modA level into wheelchair. Transported to main rehab gym for time management.   Retrieved Camie Ip to work on prolong standing/weight bearing in BLE. Pt able to grip Camie Ip bar with increased time. Sit<>stand in [place] with CGA for safety. Pt able to maintain standing with BUE support for 20-30 seconds per bout before fatigued. Several reps completed. Unable to stand without BUE support for > 5 seconds. Rest breaks in perched position to maintain weight bearing through BLE for active rest. Upgraded task for beach ball taps in standing with CGA for safety - completed laddered reps (x10, x15, x20) with seated rest break sets.   Pt returned to his room - assisted back to bed with modA squat pivot transfer. Left with all needs met.   Therapy Documentation Precautions:  Precautions Precautions: Fall Restrictions Weight Bearing Restrictions Per Provider Order: No General:    Therapy/Group: Individual Therapy  Sherlean SHAUNNA Perks 11/17/2023, 7:54 AM

## 2023-11-18 DIAGNOSIS — G6181 Chronic inflammatory demyelinating polyneuritis: Secondary | ICD-10-CM | POA: Diagnosis not present

## 2023-11-18 LAB — BASIC METABOLIC PANEL
Anion gap: 11 (ref 5–15)
BUN: 14 mg/dL (ref 6–20)
CO2: 27 mmol/L (ref 22–32)
Calcium: 9.1 mg/dL (ref 8.9–10.3)
Chloride: 101 mmol/L (ref 98–111)
Creatinine, Ser: 0.8 mg/dL (ref 0.61–1.24)
GFR, Estimated: >60 mL/min (ref >60)
Glucose, Bld: 81 mg/dL (ref 70–99)
Potassium: 3.5 mmol/L (ref 3.5–5.1)
Sodium: 139 mmol/L (ref 135–145)

## 2023-11-18 MED ORDER — CALCIUM CARBONATE ANTACID 500 MG PO CHEW
400.0000 mg | CHEWABLE_TABLET | Freq: Four times a day (QID) | ORAL | Status: DC | PRN
  Administered 2023-11-18 – 2023-11-26 (×4): 400 mg via ORAL
  Filled 2023-11-18 (×5): qty 2

## 2023-11-18 NOTE — Progress Notes (Signed)
 Occupational Therapy Note  Contacted by Camie (OT) to assess Mr Nobrega for dynamic radial nerve palsy splints. Pt with 4/5 gross grip strength L hand and 3+/5 grip strength R hand however demonstrates poor in-hand manipulation skills (L  more functional than R). Unable to extend B wrists and 1/5 digit ext B hands. Feel Mr Elgersma will benefit significantly from B radial nerve palsy splints to increase functional use of B hands and independence with ADL tasks. Will discuss scheduling with OT staff regarding fabrication of splints.   Of note, pt with B footdrop. Feel Ahmod would benefit from use of a PRAFO splint to alternate R/L foot when in bed. Educated on use of gait belt for heel cord stretch and pt able to return demonstrate.  Tyrian has B resting hand splints. ROM WFL. Taseen voiced concern regarding inability to use his hands at night. Given hand/wrist ROM WFL without tightness, recommend alternating B resting hand splint at night in order to allow one hand free for use and monitor ROM. Pt verbalized understanding.  Will follow up tomorrow.     11/18/23 1700  OT Visit Information  Last OT Received On 11/18/23  Assistance Needed +1  History of Present Illness Pt is a 58 y/o male admitted 10/29/23  with worsening weakness and gait instability from neurology clinic. 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 To have plasma exchange therapy. Dialysis catheter RIJ placed.  PMHx  HTN, HLD, chronic pain, neuropathy, gait abnormality, CIDP  Pain Assessment  Pain Assessment No/denies pain   Kreg Sink, OT/L   Acute OT Clinical Specialist Acute Rehabilitation Services Pager [phone] Office [phone]

## 2023-11-18 NOTE — Progress Notes (Signed)
 PROGRESS NOTE   Subjective/Complaints:  Pt reports feels about the same to trace weaker today after 1 dose IVIG, but notes did better with IVIG last night compared to plasmaphoresis - is hopeful will get more back.  Calves real sore from IVIG- said that occurred last time as well- concentrates pain in calves.  Feet feel freezing still.  Like walking in snow barefoot.   LBM x2 yesterday  ROS:    Pt denies SOB, abd pain, CP, N/V/C/D, and vision changes  + cold fee- no change so fart   Objective:   No results found. Recent Labs    11/16/23 0542  WBC 15.0*  HGB 13.8  HCT 40.9  PLT 397    Recent Labs    11/16/23 0542 11/18/23 0523  NA 139 139  K 4.1 3.5  CL 106 101  CO2 25 27  GLUCOSE 86 81  BUN 16 14  CREATININE 0.80 0.80  CALCIUM  8.7* 9.1     Intake/Output Summary (Last 24 hours) at 11/18/2023 0827 Last data filed at 11/18/2023 9266 Gross per 24 hour  Intake 1277 ml  Output 2300 ml  Net -1023 ml        Physical Exam: Vital Signs Blood pressure 130/83, pulse 70, temperature (!) 97.4 F (36.3 C), temperature source Oral, resp. rate 18, height 5' 7 (1.702 m), weight 85 kg, SpO2 100%.     General: awake, alert, appropriate, sitting up EOB, wearing cock up splints; NAD HENT: conjugate gaze; oropharynx moist CV: regular rate and rhythm; no JVD Pulmonary: CTA B/L; no W/R/R- good air movement GI: soft, NT, ND, (+)BS- normoactive Psychiatric: appropriate- interactive Neurological: Ox3  Cannot lift wrists/grasp against gravity- at max 2- to 2/5- Also legs barely antigravity, but really using glutes to lift at hip Skin: No evidence of breakdown, no evidence of rash  Neuro:  Decreased to light touch from elbows to finger tips B/L.  Talking in full sentences L>R Musculoskeletal:  General: Normal range of motion.     Cervical back: Neck supple. No tenderness.     Comments: RUE- biceps/triceps 4/5; WE  2-/5; Grip 3-/5 and FA 0/5 LUE- biceps and triceps 4/5; WE 0/5; Grip 2+/5 and FA 1/5 RLE- HF 4-/5; KE 4/5; KF 4-/5; DF 2/5 and PF 2+/5 LLE- HF 4-/5; KE 4/5; KF 4-/5 DF 1/5 and PF 2-/5 A little flaccid in UEs   Exam stable 2/3  Assessment/Plan: 1. Functional deficits which require 3+ hours per day of interdisciplinary therapy in a comprehensive inpatient rehab setting. Physiatrist is providing close team supervision and 24 hour management of active medical problems listed below. Physiatrist and rehab team continue to assess barriers to discharge/monitor patient progress toward functional and medical goals  Care Tool:  Bathing    Body parts bathed by patient: Right arm, Left lower leg, Face, Left arm, Chest, Abdomen, Front perineal area, Buttocks, Right upper leg, Left upper leg, Right lower leg         Bathing assist Assist Level: Supervision/Verbal cueing     Upper Body Dressing/Undressing Upper body dressing   What is the patient wearing?: Pull over shirt    Upper body assist Assist Level:  Moderate Assistance - Patient 50 - 74%    Lower Body Dressing/Undressing Lower body dressing      What is the patient wearing?: Underwear/pull up, Pants     Lower body assist Assist for lower body dressing: Maximal Assistance - Patient 25 - 49%     Toileting Toileting    Toileting assist Assist for toileting: Moderate Assistance - Patient 50 - 74%     Transfers Chair/bed transfer  Transfers assist     Chair/bed transfer assist level: Moderate Assistance - Patient 50 - 74% Chair/bed transfer assistive device: Walker, Archivist   Ambulation assist      Assist level: Minimal Assistance - Patient > 75% Assistive device: Walker-rolling Max distance: 15   Walk 10 feet activity   Assist     Assist level: Minimal Assistance - Patient > 75% Assistive device: Walker-rolling   Walk 50 feet activity   Assist    Assist level: Minimal  Assistance - Patient > 75% Assistive device: Walker-rolling    Walk 150 feet activity   Assist Walk 150 feet activity did not occur:  (fatigue and imbalance)         Walk 10 feet on uneven surface  activity   Assist Walk 10 feet on uneven surfaces activity did not occur:  (fatigue and imbalance)         Wheelchair     Assist Is the patient using a wheelchair?: Yes Type of Wheelchair: Manual Wheelchair activity did not occur: Safety/medical concerns  Wheelchair assist level: Supervision/Verbal cueing Max wheelchair distance: 150    Wheelchair 50 feet with 2 turns activity    Assist    Wheelchair 50 feet with 2 turns activity did not occur:  (fatigue and imbalance)   Assist Level: Supervision/Verbal cueing   Wheelchair 150 feet activity     Assist  Wheelchair 150 feet activity did not occur:  (fatigue and imbalance)   Assist Level: Supervision/Verbal cueing   Blood pressure 130/83, pulse 70, temperature (!) 97.4 F (36.3 C), temperature source Oral, resp. rate 18, height 5' 7 (1.702 m), weight 85 kg, SpO2 100%.  Medical Problem List and Plan: 1. 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   -d/c 2/25  Con't CIR PT and OT- with IVIG for next 3 days- changed to 15/7 to tolerate IVIG until Monday.   -Bilateral resting hand splints  -Social work provided some information on peers support groups  2.  Antithrombotics: -DVT/anticoagulation:  Pharmaceutical: Lovenox              -antiplatelet therapy: aspirin  81 mg daily   3. Pain Management: Tylenol  as needed             -continue gabapentin  900 mg TID and 900 mg daily PRN              -continue Percocet 5/325 q 4 hours PRN  -Continue Cymbalta  60 mg daily   -Consider trial  of Lyrica to replace gabapentin    4. Mood/Behavior/Sleep: LCSW to evaluate and provide emotional support             -continue Cymbalta  60 mg daily             -continue Zoloft  25 mg daily             -continue clonazepam  0.5 mg BID prn             -antipsychotic agents: n/a  -Patient requested increase of clonazepam  due to anxiety.  He takes this medication chronically PDMP reviewed, will increase temporarily to 1 mg twice daily as needed  -1/31 pt reports anxiety is improved   2/4- pt reports anxiety is better due to Prednisone  same with irritability.  5. Neuropsych/cognition: This patient is capable of making decisions on his own behalf.   6. Skin/Wound Care: Routine skin care checks   7. Fluids/Electrolytes/Nutrition: Routine Is and Os and follow-up chemistries   8: Hypertension: monitor TID and prn             -continue Avapro  300 mg daily             -continue Toprol -XL 25 mg daily  -1/31 controlled, continue current      11/18/2023    5:06 AM 11/17/2023    7:26 PM 11/17/2023    2:02 PM  Vitals with BMI  Systolic 130 107 884  Diastolic 83 78 58  Pulse 70 89 72  2/1 Will reduce losartan to 75 mg, start amlodipine  5 mg and monitor- see no. 14 BP controlled will try on amlodipine  alone 2/3 BP controlled continue current regimen   2/4- BP a little elevated DBP- at 90, but otherwise well controlled- con't regimen  2/5- BP controlled- con't regimen 9: Hypothyroidism: continue Synthroid  125 mcg daily   10: Tobacco use: cessation counseling -Nicorette  gum as needed (not using)             -? cough>> on scheduled Mucinex -DM BID   11: CIDP -continue prednisone  40 mg daily -daily VC and NIF (? end date?) -follow-up Dr. Onita -1/30 respiratory function- NIF >-40 and IS 2500, stable. Continue to monitor -2/3 NIF/IS stable   2/4- NIF's VC OK- on ICS actually- spoke with Dr Onita- she wants us  to do  another dosing of IVIG over a total of 4 days- 2g/kg total dose over 4 days- will arrange with Pharmacy- spoke with them as well- will also increase Prednisone  to 60 mg daily- since I think it was decreased- supposed to be on until f/u with Dr Onita- needs BMP daily per pharmacy while on IVIG.   2/5- BMP looks great- Sx's about the same, maybe feels  a little weaker- will recheck strength Thursday- got 1 dose IVIG yesterday- due this am again for next 3 days.  12: Leukocytosis: likely steroid induced, improving; no fever              -follow-up CBC with differential Thursday 1/30 and q Monday  1/3 WBC elevated but stable at 15,  likely steroid related    Latest Ref Rng & Units 11/16/2023    5:42 AM 11/12/2023    5:52 AM 11/09/2023    7:03 AM  CBC  WBC 4.0 - 10.5 K/uL 15.0  15.0  15.7   Hemoglobin 13.0 - 17.0 g/dL 86.1  86.0  86.2   Hematocrit 39.0 - 52.0 % 40.9  41.5  40.7   Platelets 150 - 400 K/uL 397  412  341     13.  Elevated ALT  -2/3 improved to 48 today    14. Cold feeling in b/l legs.  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  EXTR warm, pulses are normal, do not think he needs arterial Dopplers.  Likely autonomic, trial calcium  channel blocker  2/5- so far, no improvement with Norvasc  5 mg daily-    I spent a total of 39   minutes on total care today- >50% coordination of care- due to  D/w pt about IVIG and scheduling- as well as nursing.   LOS: 8 days A FACE TO FACE EVALUATION WAS PERFORMED  Fedor Kazmierski 11/18/2023, 8:27 AM

## 2023-11-18 NOTE — Progress Notes (Signed)
 Physical Therapy Weekly Progress Note  Patient Details  Name: Jonathan Luna MRN: [id number] Date of Birth: 01/29/1966  Beginning of progress report period: November 11, 2023 End of progress report period: November 18, 2023   Patient progressing toward short term goals, largely limited due to decline in functional mobility secondary to weakness. Pt re-started IVIG, plan to continue strength, flexiblity, and ROM deficits to prepare for discharge. Pt largely requires CGA/min A with bed mobility, min/mod with transfers, and min A with gait limited distances as pt presents with bilateral knee buckling and hyperextension.   Patient continues to demonstrate the following deficits muscle weakness and muscle joint tightness, unbalanced muscle activation, ataxia, decreased coordination, and decreased motor planning, and decreased standing balance and decreased postural control and therefore will continue to benefit from skilled PT intervention to increase functional independence with mobility.  Patient progressing toward long term goals..  Continue plan of care.  PT Short Term Goals Week 2:  PT Short Term Goal 1 (Week 2): Pt will require CGA with sit<>stand with LRAD PT Short Term Goal 2 (Week 2): Pt will require CGA with gait x 40 ft with LRAD PT Short Term Goal 3 (Week 2): Pt will require min A with steps x 2 with HR's  Skilled Therapeutic Interventions/Progress Updates:      Therapy Documentation Precautions:  Precautions Precautions: Fall Restrictions Weight Bearing Restrictions Per Provider Order: No    Therapy/Group: Individual Therapy  [name] [name] PT, DPT  11/18/2023, 3:50 PM

## 2023-11-18 NOTE — Progress Notes (Signed)
 Recreational Therapy Session Note  Patient Details  Name: Jonathan Luna MRN: [id number] Date of Birth: June 03, 1966 Today's Date: 11/18/2023  Pain:no c/o  Pt participated in animal assisted activity seated w/c level using BUEs to pet Dixie/therapy dog with supervision.  Pt easily engaged with pet partner team and was appreciative of this visit.  [name] 11/18/2023, 3:40 PM

## 2023-11-18 NOTE — Progress Notes (Signed)
 Physical Therapy Session Note  Patient Details  Name: Jonathan Luna MRN: [id number] Date of Birth: 1966-09-15  Today's Date: 11/18/2023 PT Individual Time: 1137-1200 PT Individual Time Calculation (min): 23 min   Short Term Goals: Week 1:  PT Short Term Goal 1 (Week 1): Pt will perform SBA sit<>stand with LRAD PT Short Term Goal 2 (Week 1): Pt will ambulate >177ft with two turns with SBA and LRAD PT Short Term Goal 3 (Week 1): Pt will perform curb step with CGA and LRAD PT Short Term Goal 3 - Progress (Week 1): Discontinued (comment)  Skilled Therapeutic Interventions/Progress Updates: Pt presents sitting in w/c w/ nursing as well as therapy dog present.  Nursing working to start IVIG throughout session.  PT will stay in room and pt to perform LE there ex including SLR, LAQ, marching and abd/add w/ cues for improved control vs speed.  Pt requires rest breaks between trials.  Pt cued for improved performance of scooting back in chair w/ placement of R FA on arm rest.  Pt remained sitting in w/c w/ leg rests elevated and all needs in reach, nursing in room w/ pt.  Missed time of 7' for nursing care.     Therapy Documentation Precautions:  Precautions Precautions: Fall Restrictions Weight Bearing Restrictions Per Provider Order: No General: PT Amount of Missed Time (min): 7 Minutes PT Missed Treatment Reason: Nursing care Vital Signs: Therapy Vitals Temp: 98.7 F (37.1 C) Pulse Rate: 81 Resp: 18 BP: 123/82 Patient Position (if appropriate): Sitting Oxygen Therapy SpO2: 96 % O2 Device: Room Air Pain:0/10 Pain Assessment Pain Scale: 0-10 Pain Score: 0-No pain   Therapy/Group: Individual Therapy  Conrad Zajkowski P Leta Bucklin 11/18/2023, 12:16 PM

## 2023-11-18 NOTE — Progress Notes (Signed)
 Occupational Therapy Weekly Progress Note  Patient Details  Name: Jonathan Luna MRN: [id number] Date of Birth: September 04, 1966  Beginning of progress report period: November 11, 2023 End of progress report period: November 18, 2023  Today's Date: 11/18/2023 OT Individual Time: 9198-9083 OT Individual Time Calculation (min): 75 min    Patient has met 0 of 3 short term goals.  Pt with slow progress and slight functional decline since eval d/t increasing weakness 2/2 diagnosis. Pt with another round of IVIg for CIDP management. Pt still presenting with significant BUE wrist drop, OT looking at potential custom dynamic wrist extension splinting for increased independence with ADLs and increased finger function with stabilized wrist. OT using estim on B wrists to increase NMRE.  Patient continues to demonstrate the following deficits: muscle weakness and muscle joint tightness, decreased cardiorespiratoy endurance, impaired timing and sequencing, abnormal tone, unbalanced muscle activation, ataxia, decreased coordination, and decreased motor planning, and decreased standing balance, decreased postural control, and decreased balance strategies and therefore will continue to benefit from skilled OT intervention to enhance overall performance with BADL and Reduce care partner burden.  Patient progressing toward long term goals..  Continue plan of care.  OT Short Term Goals Week 1:  OT Short Term Goal 1 (Week 1): Pt will complete grooming with SBA with AE as necessary OT Short Term Goal 1 - Progress (Week 1): Revised due to lack of progress OT Short Term Goal 2 (Week 1): Pt will complete LB dressing at Min A with AS as necessary OT Short Term Goal 2 - Progress (Week 1): Revised due to lack of progress OT Short Term Goal 3 (Week 1): Pt will complete donning/doffing wrist stabilizing u-cuff for feeding/grooming as necessary with SBA OT Short Term Goal 3 - Progress (Week 1): Revised due to lack of progress Week 2:   OT Short Term Goal 1 (Week 2): Pt will complete LB dressing at Mod A with AE as necessary OT Short Term Goal 2 (Week 2): Pt will complete donning/doffing wrist stabilizing u-cuff for feeding/grooming as necessary with Min A OT Short Term Goal 3 (Week 2): Pt will complete grooming with Min A with AE as necessary  Skilled Therapeutic Interventions/Progress Updates:      Therapy Documentation Precautions:  Precautions Precautions: Fall Restrictions Weight Bearing Restrictions Per Provider Order: No General: "This is great" Pt supine in bed upon OT arrival, agreeable to OT session.  Pain: no pain reported  ADL: Bed mobility: SBA with increased time and use of bed rails from supine>EOB  Grooming/oral hygiene: OT demonstrating modified ways for increased independence with brushing teeth, OT also applying built up handle and pt able to squeeze toothpaste with BUE, increased time to complete d/t ataxia Footwear: total A to don B AFOs seated EOB Transfers: EOB>W/C squat pivot light Mod A with VC for arm placement on W/C   Exercises: OT stretching wrist extensors prior to estim placement for increased ROM fr activities. OT applied Saebo one to RUE and pt practicing functional grasp/release in order to increase [facility] and coordination for completing ADLs such as grooming. OT left estim on RUE after session to finish hour cycle. OT issued pink sponge for grip/pinch and educated ot on practicing pronation/supination activities for increased ROM.  Other Treatments: OT educated pt on using built up handle for eating along with u-cuff for increased grip and wrist stability for increased independence with feeding.    Pt seated in W/C at end of session with W/C alarm donned, call  light within reach and 4Ps assessed.    Therapy/Group: Individual Therapy  Camie Hoe, OTD, OTR/L 11/18/2023, 12:52 PM

## 2023-11-18 NOTE — Progress Notes (Signed)
 Physical Therapy Session Note  Patient Details  Name: Jonathan Luna MRN: [id number] Date of Birth: Apr 11, 1966  Today's Date: 11/18/2023 PT Individual Time: 0950-1045, 1305 - 1346 PT Individual Time Calculation (min): 55 min, 41 min   Short Term Goals: Week 1:  PT Short Term Goal 1 (Week 1): Pt will perform SBA sit<>stand with LRAD PT Short Term Goal 2 (Week 1): Pt will ambulate >141ft with two turns with SBA and LRAD PT Short Term Goal 3 (Week 1): Pt will perform curb step with CGA and LRAD PT Short Term Goal 3 - Progress (Week 1): Discontinued (comment)  Skilled Therapeutic Interventions/Progress Updates:     Session 1: Pt received seated in w/c and agreeable to therapy session. Pt notes unrated LBP but states this is normal for him and no intervention required. Pt feels weak compared to his pre-admission baseline but that he mentally has energy for PT.   Pt dependently transported to dayroom. Pt's hands secured via acewrap to handles of litegait and pt peforms sit<>stand with minA and bilateral knee blocks to don litegait harness.   LiteGait: CGA-minA to faciliated R knee extension/avoid R and L knee hyperextension. 3 trials of 50-80' with intermittent supported standing rest breaks. Pt displayed R>L knee hyperextension thrust during loading response to mid-stance at beginning of trial, and excessive R knee flexion and trunk flexion d/t fatigue and using harness for support later in trial. Discussed taking rest breaks more frequently instead of using harness for support/compromising form. Pt had improved hip extension and bilateral knee extension in second and third trials. Over three trials, pt felt he used up 75% of his energy - dicussed energy conservation given PT sessions later that day.  Pt dependently transported back to room in w/c and left seated in w/c with all needs in reach and alarm on.     Session 2: Pt received seated in w/c with PIV connected to IVIG treatment. Pt  agreeable to session in room focusing on LE eccentric strength and discussing energy conservation techniques, recreational therapist present.   TherEx: - 3x12 eccentric LAQ with 4# ankle weights - SPT assisted concentric, pt verbally cued to control eccentric lowering for 5 seconds each rep. RPE 16/20 on BORG scale (heavy per the pt). Discussed purpose of eccentric exercise and rep ranges for strengthening and hypertrophy.   Energy conservation techniques: - Discussed nature of work/rest cycles and importance of rest given pt's diagnosis - created calendar for pt to note difficulty of PT/OT sessions to allow him to track work and rest during the week - encouraged pt to ask PT or OT about whether he should do exercises independently each evening or rest on a certain day  Pt left seated in w/c with all needs in reach.   Therapy Documentation Precautions:  Precautions Precautions: Fall Restrictions Weight Bearing Restrictions Per Provider Order: No General:     Therapy/Group: Individual Therapy  [name] 11/18/2023, 12:45 PM

## 2023-11-19 ENCOUNTER — Ambulatory Visit: Payer: 59 | Admitting: Occupational Therapy

## 2023-11-19 ENCOUNTER — Ambulatory Visit: Payer: 59

## 2023-11-19 LAB — BASIC METABOLIC PANEL
Anion gap: 7 (ref 5–15)
BUN: 15 mg/dL (ref 6–20)
CO2: 27 mmol/L (ref 22–32)
Calcium: 9.1 mg/dL (ref 8.9–10.3)
Chloride: 105 mmol/L (ref 98–111)
Creatinine, Ser: 0.8 mg/dL (ref 0.61–1.24)
GFR, Estimated: >60 mL/min (ref >60)
Glucose, Bld: 77 mg/dL (ref 70–99)
Potassium: 3.7 mmol/L (ref 3.5–5.1)
Sodium: 139 mmol/L (ref 135–145)

## 2023-11-19 MED ORDER — DULOXETINE HCL 60 MG PO CPEP
90.0000 mg | ORAL_CAPSULE | Freq: Every day | ORAL | Status: DC
  Administered 2023-11-20 – 2023-11-25 (×6): 90 mg via ORAL
  Filled 2023-11-19 (×6): qty 1

## 2023-11-19 MED ORDER — DULOXETINE HCL 30 MG PO CPEP
30.0000 mg | ORAL_CAPSULE | Freq: Once | ORAL | Status: AC
  Administered 2023-11-19: 30 mg via ORAL
  Filled 2023-11-19: qty 1

## 2023-11-19 NOTE — Progress Notes (Addendum)
 Occupational Therapy Session Note  Patient Details  Name: Jonathan Luna MRN: [id number] Date of Birth: 1966-07-23  Today's Date: 11/19/2023 OT Individual Time: 1127-1200 & 8569-8461 OT Individual Time Calculation (min): 33 min & 68 min    Short Term Goals: Week 2:  OT Short Term Goal 1 (Week 2): Pt will complete LB dressing at Mod A with AE as necessary OT Short Term Goal 2 (Week 2): Pt will complete donning/doffing wrist stabilizing u-cuff for feeding/grooming as necessary with Min A OT Short Term Goal 3 (Week 2): Pt will complete grooming with Min A with AE as necessary  Skilled Therapeutic Interventions/Progress Updates:      Therapy Documentation Precautions:  Precautions Precautions: Fall Restrictions Weight Bearing Restrictions Per Provider Order: No Session 1 General: "Hi there" Pt seated in W/C upon OT arrival, agreeable to OT. Pt dependently transferred to therapy gym for time constraints.  Pain: no pain reported  Exercises:  Pt completed the following exercise circuit in order to improve functional activity, strength, endurance and ROM to prepare for ADLs such as bathing. Pt completed the following exercises in seated position with no noted LOB/SOB and 2x20 repetitions on each exercise: -wall washes up/down (increased time for Lt side and AAROM) -throwing/catching non-weighted medicine ball with increased time, dropping ball when trying to stabilize when throwing d/t ataxic movements   Pt seated in W/C at end of session with W/C alarm donned, call light within reach and 4Ps assessed. Pt set-up for lunch.    Session 2 General: "This was a great workout." Pt supine in bed upon OT arrival, agreeable to OT session. On IVIg with nsg intermittently checking vitals and changing vials.   Pain: no pain reported  Exercises: Pt completed the following exercise circuit in order to improve functional activity, strength and endurance to prepare for ADLs such as bathing. Pt  completed the following exercises in supine position with no noted LOB/SOB and 3x15 repetitions on each exercise, red theraband for LUE and green theraband for RUE: -bicep curls -triceps extensions -forward punches -shoulder press -grasp/release with emphasis on finger extension -glute bridges -hip flexion leg lifts -hip flexion and knee flexion leg lifts (marching motion)   Other Treatments: OT noted redness on thenar eminence 2/2 from wrist support u-cuff, OT educated pt to take off for skin safety and only wear when eating. Plan for making of dynamic splints tomorrow for increased use of UE for ADLs.   Pt supine in bed with bed alarm activated, 2 bed rails up, call light within reach and 4Ps assessed. Direct handoff from nsg.   Therapy/Group: Individual Therapy  Camie Hoe, OTD, OTR/L 11/19/2023, 4:14 PM

## 2023-11-19 NOTE — Progress Notes (Signed)
 Physical Therapy Session Note  Patient Details  Name: Jonathan Luna MRN: [id number] Date of Birth: Jun 23, 1966  Today's Date: 11/19/2023 PT Individual Time: 0900-0945 PT Individual Time Calculation (min): 45 min   Short Term Goals: Week 2:  PT Short Term Goal 1 (Week 2): Pt will require CGA with sit<>stand with LRAD PT Short Term Goal 2 (Week 2): Pt will require CGA with gait x 40 ft with LRAD PT Short Term Goal 3 (Week 2): Pt will require min A with steps x 2 with HR's  Skilled Therapeutic Interventions/Progress Updates:     Pt received supine in bed, agreeable to therapy, and only notes normal LBP and that no intervention required. Pt had continent void prior to starting therapy - documented in flowsheet.  SPT dependently donned pt's socks, shoes, and B AFOs. Pt performs modA squat pivot transfer to w/c and dependently transported to dayroom. Pt reports his energy levels are good and feels up to more gait training with liteGait.  LiteGait: modA sit<>stand for don/doff harness with knee block, CGA gait training with tactile cue to avoid R knee hyperextension thrust and facilitate knee extension in mid stance.  - 5 bouts of 35-60' with rest breaks in harness between bouts. Decreased hyperextension thrust on R and trunk flexion compared to yesterday. Pt given verbal cues to slow down gait to allow for better muscular control during loading response to midstance, and keeping legs underneath him. Pt reports 35-40% energy levels at the end of ambulation and has decreased R quad control as he fatigues. Pt reports it feels hard to push-off from his toes and discussed effect of PLS AFOs on plantarflexion during terminal stance and pre-swing. Re-emphasized importance of regular rest breaks for energy conservation.   Pt dependently transported back to room and left seated in w/c with all needs in reach. Documented on pt's schedule RPE of moderate/heavy for session.   Therapy Documentation Precautions:   Precautions Precautions: Fall Restrictions Weight Bearing Restrictions Per Provider Order: No General:      Therapy/Group: Individual Therapy  [name] 11/19/2023, 3:30 PM

## 2023-11-19 NOTE — Progress Notes (Signed)
 Pt performed NIF > -40 VC 39.2L   Performed with great effort.

## 2023-11-19 NOTE — Evaluation (Signed)
 Recreational Therapy Assessment and Plan  Patient Details  Name: Jonathan Luna MRN: [id number] Date of Birth: January 23, 1966 Today's Date: 11/19/2023  Rehab Potential:  Good ELOS:   d/c 2/25  Assessment Hospital Problem: Principal Problem:   CIDP (chronic inflammatory demyelinating polyneuropathy) (HCC)     Past Medical History:      Past Medical History:  Diagnosis Date   Allergy      Anxiety     Arthritis     Cataract     Community acquired pneumonia 08/28/2023   Depression     Detached retina     Heart murmur     History of bilateral cataract extraction     Hyperlipidemia     Hypothyroidism     Low back pain     Panic attack     Panic attacks     Pneumonia of right upper lobe due to infectious organism 08/18/2023        Past Surgical History:       Past Surgical History:  Procedure Laterality Date   CATARACT EXTRACTION       EYE SURGERY       IR FLUORO GUIDE CV LINE RIGHT   10/29/2023   IR US  GUIDE VASC ACCESS RIGHT   10/29/2023          Assessment & Plan Clinical Impression: Jonathan Luna is a 58 year old R handed male who was discharged from CIR on 10/20/2023 due to acute inflammatory demyelinating polyneuropathy treated with 5 day course of IVIG. He has had issues with ongoing numbness. He presented to the ED on 10/25/2023 where exam was notable for decreased sensation to bilateral side of face and cheek as well as decrease sensation to bilateral forearms and hands. He performed well on NIF and was able to ambulate with RW. Discharged with neurology follow-up as outpatient on 1/16. At that visit with Dr. Onita, he agreed to admission to the hospital for PLEX and admitted by TRH. Dr. Michaela consulted. PLEX ordered for 5 treatment as well as VC and NIF every 12 hours. Medically stabilized. Labs stable. Tolerating diet. NIF greater than -40 cm of water and incentive spirometry 2500 mL. Requiring min assist for basic self-care skills and contact-guard assist for mobility.The  patient requires inpatient medicine and rehabilitation evaluations and services for ongoing dysfunction secondary to acute exacerbation of inflammatory demyelinating polyneuropathy. .  Patient transferred to CIR on 11/10/2023.     Familiar with pt from previous admission.  Pt presents with decreased activity tolerance, decreased functional mobility, decreased balance, decreased coordination, feelings of stress/anxiety Limiting pt's independence with leisure/community pursuits.  Met with pt today to discuss TR services including leisure education, activity analysis/modifications and stress management.  Also discussed the importance of social, emotional, spiritual health in addition to physical health and their effects on overall health and wellness.  Pt stated difficulty coping and understanding medical condition.  Pt has many concerns about progress and functional status at discharge as well as limited support once home.  Emotional support provided.    Plan  Min 1 TR session >20 minutes per week during LOS  Recommendations for other services: Neuropsych  Discharge Criteria: Patient will be discharged from TR if patient refuses treatment 3 consecutive times without medical reason.  If treatment goals not met, if there is a change in medical status, if patient makes no progress towards goals or if patient is discharged from hospital.  The above assessment, treatment plan, treatment alternatives and goals were discussed  and mutually agreed upon: by patient  Jonathan Luna 11/19/2023, 3:50 PM

## 2023-11-19 NOTE — Progress Notes (Signed)
 PROGRESS NOTE   Subjective/Complaints:  Pt reports nerve pain in feet still feels like feet wet, walking in snow barefoot.  No change so far with Norvasc .  Elevating leg rests helps a little.  Also having nerve pain otherwise.  Was hopeful this means things getting better- explained I literally cannot give a good answer on this.  ROS:    Pt denies SOB, abd pain, CP, N/V/C/D, and vision changes   + cold fee- no change so far   Objective:   No results found. No results for input(s): WBC, HGB, HCT, PLT in the last 72 hours.   Recent Labs    11/18/23 0523 11/19/23 0552  NA 139 139  K 3.5 3.7  CL 101 105  CO2 27 27  GLUCOSE 81 77  BUN 14 15  CREATININE 0.80 0.80  CALCIUM  9.1 9.1     Intake/Output Summary (Last 24 hours) at 11/19/2023 0843 Last data filed at 11/19/2023 0756 Gross per 24 hour  Intake 1370 ml  Output 2200 ml  Net -830 ml        Physical Exam: Vital Signs Blood pressure 118/85, pulse 66, temperature 97.8 F (36.6 C), temperature source Oral, resp. rate 16, height 5' 7 (1.702 m), weight 85 kg, SpO2 100%.      General: awake, alert, appropriate,  trying to feed self- but having difficulties doing so; NAD HENT: conjugate gaze; oropharynx moist CV: regular rate and rhythm; no JVD Pulmonary: CTA B/L; no W/R/R- good air movement GI: soft, NT, ND, (+)BS Psychiatric: appropriate- a little more sad Neurological: Ox3 RUE- biceps 4/5; WE 0/5; Triceps 3+/5; grip 2/5 and FA 0/5 LUE- biceps 4+/5; WE 0/5; triceps 3-/5; grip 2 to 2-/5 and FA 1/5 Skin: No evidence of breakdown, no evidence of rash  Neuro:  Decreased to light touch from elbows to finger tips B/L.  Talking in full sentences L>R Musculoskeletal:  General: Normal range of motion.     Cervical back: Neck supple. No tenderness.     Comments: RUE- biceps/triceps 4/5; WE 2-/5; Grip 3-/5 and FA 0/5 LUE- biceps and triceps 4/5; WE  0/5; Grip 2+/5 and FA 1/5 RLE- HF 4-/5; KE 4/5; KF 4-/5; DF 2/5 and PF 2+/5 LLE- HF 4-/5; KE 4/5; KF 4-/5 DF 1/5 and PF 2-/5 A little flaccid in UEs   Exam stable 2/3  Assessment/Plan: 1. Functional deficits which require 3+ hours per day of interdisciplinary therapy in a comprehensive inpatient rehab setting. Physiatrist is providing close team supervision and 24 hour management of active medical problems listed below. Physiatrist and rehab team continue to assess barriers to discharge/monitor patient progress toward functional and medical goals  Care Tool:  Bathing    Body parts bathed by patient: Right arm, Left lower leg, Face, Left arm, Chest, Abdomen, Front perineal area, Buttocks, Right upper leg, Left upper leg, Right lower leg         Bathing assist Assist Level: Supervision/Verbal cueing     Upper Body Dressing/Undressing Upper body dressing   What is the patient wearing?: Pull over shirt    Upper body assist Assist Level: Moderate Assistance - Patient 50 - 74%  Lower Body Dressing/Undressing Lower body dressing      What is the patient wearing?: Underwear/pull up, Pants     Lower body assist Assist for lower body dressing: Maximal Assistance - Patient 25 - 49%     Toileting Toileting    Toileting assist Assist for toileting: Moderate Assistance - Patient 50 - 74%     Transfers Chair/bed transfer  Transfers assist     Chair/bed transfer assist level: Moderate Assistance - Patient 50 - 74% Chair/bed transfer assistive device: Walker, Archivist   Ambulation assist      Assist level: Minimal Assistance - Patient > 75% Assistive device: Walker-rolling Max distance: 15   Walk 10 feet activity   Assist     Assist level: Minimal Assistance - Patient > 75% Assistive device: Walker-rolling   Walk 50 feet activity   Assist    Assist level: Minimal Assistance - Patient > 75% Assistive device: Walker-rolling     Walk 150 feet activity   Assist Walk 150 feet activity did not occur:  (fatigue and imbalance)         Walk 10 feet on uneven surface  activity   Assist Walk 10 feet on uneven surfaces activity did not occur:  (fatigue and imbalance)         Wheelchair     Assist Is the patient using a wheelchair?: Yes Type of Wheelchair: Manual Wheelchair activity did not occur: Safety/medical concerns  Wheelchair assist level: Supervision/Verbal cueing Max wheelchair distance: 150    Wheelchair 50 feet with 2 turns activity    Assist    Wheelchair 50 feet with 2 turns activity did not occur:  (fatigue and imbalance)   Assist Level: Supervision/Verbal cueing   Wheelchair 150 feet activity     Assist  Wheelchair 150 feet activity did not occur:  (fatigue and imbalance)   Assist Level: Supervision/Verbal cueing   Blood pressure 118/85, pulse 66, temperature 97.8 F (36.6 C), temperature source Oral, resp. rate 16, height 5' 7 (1.702 m), weight 85 kg, SpO2 100%.  Medical Problem List and Plan: 1. 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   -d/c 2/25  Con't CIR PT and OT  15/7 this week  -Bilateral resting hand splints  -Social work provided some information on peers support groups  2.  Antithrombotics: -DVT/anticoagulation:  Pharmaceutical: Lovenox              -antiplatelet therapy: aspirin  81 mg daily   3. Pain Management: Tylenol  as needed             -continue gabapentin  900 mg TID and 900 mg daily PRN             -continue Percocet 5/325 q 4 hours PRN  -Continue Cymbalta  60 mg daily   -Consider trial of Lyrica to replace gabapentin    2/6-  will increase Duloxetine  to 90 mg daily and stop Zoloft  so can do so.  4. Mood/Behavior/Sleep: LCSW to evaluate and provide emotional support             -continue Cymbalta  60 mg daily             -continue Zoloft  25 mg daily             -continue clonazepam  0.5 mg BID prn             -antipsychotic agents: n/a  -Patient requested increase of clonazepam  due to anxiety.  He takes this medication chronically PDMP reviewed, will increase temporarily to 1 mg twice daily as needed  -1/31 pt reports anxiety is improved   2/4- pt reports anxiety is better due to Prednisone  same with irritability.  5. Neuropsych/cognition: This patient is capable of making decisions on his own behalf.   6. Skin/Wound Care: Routine skin care checks   7. Fluids/Electrolytes/Nutrition: Routine Is and Os and follow-up chemistries   8: Hypertension: monitor TID and prn             -continue Avapro  300 mg daily             -continue Toprol -XL 25 mg daily  -1/31 controlled, continue current      11/19/2023    4:10 AM 11/18/2023    8:30 PM 11/18/2023    2:03 PM  Vitals with BMI  Systolic 118 121 877  Diastolic 85 76 82  Pulse 66 79 99  2/1 Will reduce losartan to 75 mg, start amlodipine  5 mg and monitor- see no. 14 BP controlled will try on amlodipine  alone 2/3 BP controlled continue current regimen   2/4- BP a little elevated DBP- at 90, but otherwise well controlled- con't regimen  2/5-2/6 BP controlled- con't regimen 9: Hypothyroidism: continue Synthroid  125 mcg daily   10: Tobacco use: cessation counseling -Nicorette  gum as needed (not using)             -? cough>> on scheduled Mucinex -DM BID   11: CIDP -continue prednisone  40 mg daily -daily VC and NIF (? end date?) -follow-up Dr. Onita -1/30 respiratory function- NIF >-40 and IS 2500, stable. Continue to monitor -2/3 NIF/IS stable   2/4- NIF's VC OK- on ICS actually- spoke with Dr Onita- she wants us  to do another dosing of IVIG over a total of 4 days-  2g/kg total dose over 4 days- will arrange with Pharmacy- spoke with them as well- will also increase Prednisone  to 60 mg daily- since I think it was decreased- supposed to be on until f/u with Dr Onita- needs BMP daily per pharmacy while on IVIG.   2/5- BMP looks great- Sx's about the same, maybe feels  a little weaker- will recheck strength Thursday- got 1 dose IVIG yesterday- due this am again for next 3 days.   2/6- BMP looks good- strength a little better in grip and triceps, but not in WE- con't regimen 12: Leukocytosis: likely steroid induced, improving; no fever              -follow-up CBC with differential Thursday 1/30 and q Monday  1/3 WBC elevated but stable at 15,  likely steroid related    Latest Ref Rng & Units 11/16/2023    5:42 AM 11/12/2023    5:52 AM 11/09/2023    7:03 AM  CBC  WBC 4.0 - 10.5 K/uL 15.0  15.0  15.7   Hemoglobin 13.0 - 17.0 g/dL 86.1  86.0  86.2   Hematocrit 39.0 - 52.0 % 40.9  41.5  40.7   Platelets 150 - 400 K/uL 397  412  341     13.  Elevated ALT  -2/3 improved to 48 today    14. Cold feeling in b/l legs.  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  EXTR warm, pulses are normal, do not think he needs arterial Dopplers.  Likely autonomic, trial calcium  channel blocker  2/5- so far, no improvement with Norvasc  5 mg daily-   2/6- pt emphatic that it's the worst part of his pain- he also describes feet feeling wet - but are dry and warmish to touch- will increase Duloxetine  to 90 mg daily and sotp Zoloft .     I spent a total of 36   minutes on total care today- >50% coordination of care- due to  Review of nerve pain meds- med changes- and d/w nursing about status.    LOS: 9 days A FACE TO FACE EVALUATION WAS PERFORMED  Sahithi Ordoyne 11/19/2023, 8:43 AM

## 2023-11-20 DIAGNOSIS — F54 Psychological and behavioral factors associated with disorders or diseases classified elsewhere: Secondary | ICD-10-CM

## 2023-11-20 LAB — BASIC METABOLIC PANEL
Anion gap: 8 (ref 5–15)
BUN: 14 mg/dL (ref 6–20)
CO2: 27 mmol/L (ref 22–32)
Calcium: 8.9 mg/dL (ref 8.9–10.3)
Chloride: 103 mmol/L (ref 98–111)
Creatinine, Ser: 0.87 mg/dL (ref 0.61–1.24)
GFR, Estimated: >60 mL/min (ref >60)
Glucose, Bld: 80 mg/dL (ref 70–99)
Potassium: 4.1 mmol/L (ref 3.5–5.1)
Sodium: 138 mmol/L (ref 135–145)

## 2023-11-20 MED FILL — Immune Globulin (Human) IV Soln 5 GM/50ML: INTRAVENOUS | Qty: 50 | Status: AC

## 2023-11-20 MED FILL — Immune Globulin (Human) IV Soln 20 GM/200ML: INTRAVENOUS | Qty: 200 | Status: AC

## 2023-11-20 MED FILL — Immune Globulin (Human) IV Soln 10 GM/100ML: INTRAVENOUS | Qty: 100 | Status: AC

## 2023-11-20 NOTE — Progress Notes (Signed)
 NIF>-40 VC-40L

## 2023-11-20 NOTE — Progress Notes (Signed)
 Physical Therapy Session Note  Patient Details  Name: Jonathan Luna MRN: [id number] Date of Birth: July 04, 1966  Today's Date: 11/20/2023 PT Individual Time: 1049-1200 PT Individual Time Calculation (min): 71 min   Short Term Goals: Week 2:  PT Short Term Goal 1 (Week 2): Pt will require CGA with sit<>stand with LRAD PT Short Term Goal 2 (Week 2): Pt will require CGA with gait x 40 ft with LRAD PT Short Term Goal 3 (Week 2): Pt will require min A with steps x 2 with HR's  Skilled Therapeutic Interventions/Progress Updates:     Pt received supine in bed and agreeable to PT session focusing on gait training and LE strengthening. Pt does not note any pain at this time. SPT dependently donned socks, shoes, and B PLSAFO. Pt performs squat pivot transfer with modA for sequencing, hand placement, stabilizing w/c, and bilateral knee block.   Gait training: three musketeers - mod-maxAx2 for trunk support, bilateral knee block, R leg abduction during swing phase, and intermittent facilitation of weight shift - Bout 1: 42', verbal cues for weight shift and decreasing step frequency to improve quad control, some hyperextension thrust bilaterally  - Bout 2: 60', pt able to get into good rhythm and initiate lateral weight shift better, decreased hyperextension thrust but still occasionally present.   Pt self propelled in w/c ~100' backwards using quads/leg extension and ~100' forwards using hamstrings/leg curls with verbal cues for reciprocal pattern and rest breaks. Pt required several rest breaks while self propelling forwards d/t hamstring weakness. Pt dependently transported the rest of the way to room while holding isometric leg extension.  TherEx to improve B quad and hamstring strength: verbal cues for breathing and technique - 2x6 LAQ with 5# ankle weight and 5s eccentric lower  - 2x6 resisted seated hamstring curl with blue Tband - 1x12 seated clamshell with blue T band - 1x6 five second isometric  adduction hold  Pt education on rep ranges for strengthening in deconditioned individuals, reps in reserve, and target intensity. Pt instructed to do 1 round of exercises this evening if his gas tank is back to 75% or higher after dinner. SPT helped pt document difficulty of session (heavy) in pt's daily log.  Pt left seated in w/c with all needs in reach.   Therapy Documentation Precautions:  Precautions Precautions: Fall Restrictions Weight Bearing Restrictions Per Provider Order: No General:      Therapy/Group: Individual Therapy  [name] 11/20/2023, 2:30 PM

## 2023-11-20 NOTE — Progress Notes (Signed)
 Occupational Therapy Note  Pt seen for splint check. Tolerating splints without noted redness. Sign posted regarding splints and wearing times. Pt very appreciative.  Will follow up as needed.     11/20/23 1752  OT Visit Information  Last OT Received On 11/20/23  History of Present Illness Pt is a 58 y/o male admitted 10/29/23  with worsening weakness and gait instability from neurology clinic. 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 To have plasma exchange therapy. Dialysis catheter RIJ placed.  PMHx  HTN, HLD, chronic pain, neuropathy, gait abnormality, CIDP  Precautions  Precautions Fall  Pain Assessment  Pain Assessment Faces  Faces Pain Scale 2  Pain Location low back  Pain Descriptors / Indicators Discomfort  Pain Intervention(s) Limited activity within patient's tolerance  General Comments  General comments (skin integrity, edema, etc.) Pt seen for splint check. Tolerating without issues; Note - area of redness on webspace of R hand form previously used WHO splint.  OT - End of Session  Activity Tolerance Patient tolerated treatment well  Patient left Other (comment)  Nurse Communication Other (comment)  OT Assessment/Plan  OT Visit Diagnosis Unsteadiness on feet (R26.81);Other abnormalities of gait and mobility (R26.89);Muscle weakness (generalized) (M62.81)  Follow Up Recommendations Acute inpatient rehab (3hours/day)  Patient can return home with the following A little help with walking and/or transfers;A lot of help with bathing/dressing/bathroom;Assistance with cooking/housework;Assist for transportation;Help with stairs or ramp for entrance  OT Equipment BSC/3in1;Other (comment)  AM-PAC OT 6 Clicks Daily Activity Outcome Measure (Version 2)  Help from another person eating meals? 3  Help from another person taking care of personal grooming? 3  Help from another person toileting, which includes using toliet, bedpan, or urinal? 3   Help from another person bathing (including washing, rinsing, drying)? 2  Help from another person to put on and taking off regular upper body clothing? 2  Help from another person to put on and taking off regular lower body clothing? 2  6 Click Score 15  OT Goal Progression  Progress towards OT goals Progressing toward goals  Acute Rehab OT Goals  Patient Stated Goal get better  OT Goal Formulation With patient  Time For Goal Achievement 12/04/23  Potential to Achieve Goals Good  OT Time Calculation  OT Start Time (ACUTE ONLY) 1555  OT Stop Time (ACUTE ONLY) 1615  OT Time Calculation (min) 20 min  OT General Charges  $OT Visit 1 Visit  OT Treatments  $Orthotics/Prosthetics Check 8-22 mins   Kreg Sink, OT/L   Acute OT Clinical Specialist Acute Rehabilitation Services Pager [phone] Office [phone]

## 2023-11-20 NOTE — Progress Notes (Signed)
 Occupational Therapy note  Pt seen for fabrication of B radial nerve palsy splints to increase functional use of B hands. Pt able to achieve significantly improved functional use of B hands, especially left,  while wearing splints. Pt educated on purpose of splints - to be used for 1-2 hour intervals for function and for exercise. As tolerance builds, pt may wear for longer periods of time. Recommend wearing wrist hand orthotic when not wearing radial nerve palsy splints, or keeping hand supported to prevent over stretch of wrist extensors. Recommend wearing resting hand splints at night - alternate hands os that one hand is free at all times.  Pt completed therapeutic grasp/release tasks incorporating functional movement patterns while wearing splints. Educated pt/nsg staff on how to donn/doff splints. Both verbalized/demonstrated understanding.    Media Information   Media Information   Will follow up for splint check. Kreg Sink, OT/L   Acute OT Clinical Specialist Acute Rehabilitation Services Pager [phone] Office [phone]

## 2023-11-20 NOTE — Progress Notes (Signed)
 PROGRESS NOTE   Subjective/Complaints:  Pt reports so far, cold feet slightly bettter with increase in Cymbalta .   Really tired since started Norvasc - wondering if can stop it- since also not helping foot coldness- like walking barefoot in snow.   Last day IVIG Slept well ROS:    Pt denies SOB, abd pain, CP, N/V/C/D, and vision changes   + cold fee- slightly better   Objective:   No results found. No results for input(s): WBC, HGB, HCT, PLT in the last 72 hours.   Recent Labs    11/19/23 0552 11/20/23 0612  NA 139 138  K 3.7 4.1  CL 105 103  CO2 27 27  GLUCOSE 77 80  BUN 15 14  CREATININE 0.80 0.87  CALCIUM  9.1 8.9     Intake/Output Summary (Last 24 hours) at 11/20/2023 1331 Last data filed at 11/20/2023 1000 Gross per 24 hour  Intake 358 ml  Output 2925 ml  Net -2567 ml        Physical Exam: Vital Signs Blood pressure 132/84, pulse 86, temperature 97.8 F (36.6 C), resp. rate 17, height 5' 7 (1.702 m), weight 82.5 kg, SpO2 94%.       General: awake, alert, appropriate, sitting up EOB- finished 100% tray; NAD HENT: conjugate gaze; oropharynx moist CV: regular rate and rhythm; no JVD Pulmonary: CTA B/L; no W/R/R- good air movement GI: soft, NT, ND, (+)BS Psychiatric: appropriate Neurological: Ox3 No significant change in strength today RUE- biceps 4/5; WE 0/5; Triceps 3+/5; grip 2/5 and FA 0/5 LUE- biceps 4+/5; WE 0/5; triceps 3-/5; grip 2 to 2-/5 and FA 1/5 Skin: No evidence of breakdown, no evidence of rash  Neuro:  Decreased to light touch from elbows to finger tips B/L.  Talking in full sentences L>R Musculoskeletal:  General: Normal range of motion.     Cervical back: Neck supple. No tenderness.     Comments: RUE- biceps/triceps 4/5; WE 2-/5; Grip 3-/5 and FA 0/5 LUE- biceps and triceps 4/5; WE 0/5; Grip 2+/5 and FA 1/5 RLE- HF 4-/5; KE 4/5; KF 4-/5; DF 2/5 and PF 2+/5 LLE-  HF 4-/5; KE 4/5; KF 4-/5 DF 1/5 and PF 2-/5 A little flaccid in UEs   Exam stable 2/3  Assessment/Plan: 1. Functional deficits which require 3+ hours per day of interdisciplinary therapy in a comprehensive inpatient rehab setting. Physiatrist is providing close team supervision and 24 hour management of active medical problems listed below. Physiatrist and rehab team continue to assess barriers to discharge/monitor patient progress toward functional and medical goals  Care Tool:  Bathing    Body parts bathed by patient: Right arm, Left lower leg, Face, Left arm, Chest, Abdomen, Front perineal area, Buttocks, Right upper leg, Left upper leg, Right lower leg         Bathing assist Assist Level: Supervision/Verbal cueing     Upper Body Dressing/Undressing Upper body dressing   What is the patient wearing?: Pull over shirt    Upper body assist Assist Level: Moderate Assistance - Patient 50 - 74%    Lower Body Dressing/Undressing Lower body dressing      What is the patient wearing?: Underwear/pull up,  Pants     Lower body assist Assist for lower body dressing: Maximal Assistance - Patient 25 - 49%     Toileting Toileting    Toileting assist Assist for toileting: Moderate Assistance - Patient 50 - 74%     Transfers Chair/bed transfer  Transfers assist     Chair/bed transfer assist level: Moderate Assistance - Patient 50 - 74% Chair/bed transfer assistive device: Walker, Archivist   Ambulation assist      Assist level: Minimal Assistance - Patient > 75% Assistive device: Walker-rolling Max distance: 15   Walk 10 feet activity   Assist     Assist level: Minimal Assistance - Patient > 75% Assistive device: Walker-rolling   Walk 50 feet activity   Assist    Assist level: Minimal Assistance - Patient > 75% Assistive device: Walker-rolling    Walk 150 feet activity   Assist Walk 150 feet activity did not occur:   (fatigue and imbalance)         Walk 10 feet on uneven surface  activity   Assist Walk 10 feet on uneven surfaces activity did not occur:  (fatigue and imbalance)         Wheelchair     Assist Is the patient using a wheelchair?: Yes Type of Wheelchair: Manual Wheelchair activity did not occur: Safety/medical concerns  Wheelchair assist level: Supervision/Verbal cueing Max wheelchair distance: 150    Wheelchair 50 feet with 2 turns activity    Assist    Wheelchair 50 feet with 2 turns activity did not occur:  (fatigue and imbalance)   Assist Level: Supervision/Verbal cueing   Wheelchair 150 feet activity     Assist  Wheelchair 150 feet activity did not occur:  (fatigue and imbalance)   Assist Level: Supervision/Verbal cueing   Blood pressure 132/84, pulse 86, temperature 97.8 F (36.6 C), resp. rate 17, height 5' 7 (1.702 m), weight 82.5 kg, SpO2 94%.  Medical Problem List and Plan: 1. 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   -d/c 2/25  Con't CIR PT and OT  Last day of IVIG-   -Bilateral resting hand splints  -Social work provided some information on peers support groups  2.  Antithrombotics: -DVT/anticoagulation:  Pharmaceutical: Lovenox              -antiplatelet therapy: aspirin  81 mg daily   3. Pain Management: Tylenol  as needed             -continue gabapentin  900 mg TID and 900 mg daily PRN             -continue Percocet 5/325 q 4 hours PRN  -Continue Cymbalta  60 mg daily   -Consider trial of Lyrica to replace gabapentin    2/6- will increase Duloxetine  to 90 mg daily and stop Zoloft  so can do so.   2/7- pain already a little  better-  4. Mood/Behavior/Sleep: LCSW to evaluate and provide emotional support             -  continue Cymbalta  60 mg daily             -continue Zoloft  25 mg daily             -continue clonazepam  0.5 mg BID prn             -antipsychotic agents: n/a  -Patient requested increase of clonazepam  due to anxiety.  He takes this medication chronically PDMP reviewed, will increase temporarily to 1 mg twice daily as needed  -1/31 pt reports anxiety is improved   2/4- pt reports anxiety is better due to Prednisone  same with irritability.  5. Neuropsych/cognition: This patient is capable of making decisions on his own behalf.   6. Skin/Wound Care: Routine skin care checks   7. Fluids/Electrolytes/Nutrition: Routine Is and Os and follow-up chemistries   8: Hypertension: monitor TID and prn             -continue Avapro  300 mg daily             -continue Toprol -XL 25 mg daily  -1/31 controlled, continue current      11/20/2023    1:16 PM 11/20/2023    1:02 PM 11/20/2023   12:47 PM  Vitals with BMI  Systolic 132 114 879  Diastolic 84 77 76  Pulse 86 88 87  2/1 Will reduce losartan to 75 mg, start amlodipine  5 mg and monitor- see no. 14 BP controlled will try on amlodipine  alone 2/3 BP controlled continue current regimen   2/4- BP a little elevated DBP- at 90, but otherwise well controlled- con't regimen  2/5-2/7BP controlled- per pt request, will stop Norvasc  and change BP meds if needed this weekend.  9: Hypothyroidism: continue Synthroid  125 mcg daily   10: Tobacco use: cessation counseling -Nicorette  gum as needed (not using)             -? cough>> on scheduled Mucinex -DM BID   11: CIDP -continue prednisone  40 mg daily -daily VC and NIF (? end date?) -follow-up Dr. Onita -1/30 respiratory function- NIF >-40 and IS 2500, stable. Continue to monitor -2/3 NIF/IS stable   2/4- NIF's VC OK- on ICS actually- spoke with Dr Onita- she wants us  to do another dosing of IVIG over a total of 4 days-  2g/kg total dose over 4 days- will arrange with Pharmacy- spoke with them as well- will also increase Prednisone  to 60 mg daily- since I think it was decreased- supposed to be on until f/u with Dr Onita- needs BMP daily per pharmacy while on IVIG.   2/5- BMP looks great- Sx's about the same, maybe feels  a little weaker- will recheck strength Thursday- got 1 dose IVIG yesterday- due this am again for next 3 days.   2/6- BMP looks good- strength a little better in grip and triceps, but not in WE- con't regimen  2/7- slightly better, but no major differences- on last day of IVIG- NIF -40- and VC 40 12: Leukocytosis: likely steroid induced, improving; no fever              -follow-up CBC with differential Thursday 1/30 and q Monday  1/3 WBC elevated but stable at 15,  likely steroid related    Latest Ref Rng & Units 11/16/2023    5:42 AM 11/12/2023    5:52 AM 11/09/2023    7:03 AM  CBC  WBC 4.0 - 10.5 K/uL 15.0  15.0  15.7   Hemoglobin 13.0 - 17.0 g/dL 86.1  86.0  13.7  Hematocrit 39.0 - 52.0 % 40.9  41.5  40.7   Platelets 150 - 400 K/uL 397  412  341     13.  Elevated ALT  -2/3 improved to 48 today    14. Cold feeling in b/l legs.  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  EXTR warm, pulses are normal, do not think he needs arterial Dopplers.  Likely autonomic, trial calcium  channel blocker  2/5- so far, no improvement with Norvasc  5 mg daily-   2/6- pt emphatic that it's the worst part of his pain- he also describes feet feeling wet - but are dry and warmish to touch- will increase Duloxetine  to 90 mg daily and sotp Zoloft .   2/7- pt agreed to change- said nerve pain/cold in feet slightly better already- thinks Norvasc  made him feel fatigued, so wants to stop     I spent a total of 37   minutes on total care today- >50% coordination of care- due to  D/w pt about ivig and chronic-ness of CIDP- waxing and waning- went over again- pt didn't appear to understand it's a chronic illness-   LOS: 10 days A  FACE TO FACE EVALUATION WAS PERFORMED  Vita Currin 11/20/2023, 1:31 PM

## 2023-11-21 DIAGNOSIS — F411 Generalized anxiety disorder: Secondary | ICD-10-CM

## 2023-11-21 NOTE — Progress Notes (Signed)
 PROGRESS NOTE   Subjective/Complaints:  Completed IVIG. Happy about that. Arms and legs still feel heavy, especially distally. Pain under reasonable control. Feet still feel cold.   ROS: Patient denies fever, rash, sore throat, blurred vision, dizziness, nausea, vomiting, diarrhea, cough, shortness of breath or chest pain, joint or back/neck pain, headache, or mood change.   Objective:   No results found. No results for input(s): WBC, HGB, HCT, PLT in the last 72 hours.   Recent Labs    11/19/23 0552 11/20/23 0612  NA 139 138  K 3.7 4.1  CL 105 103  CO2 27 27  GLUCOSE 77 80  BUN 15 14  CREATININE 0.80 0.87  CALCIUM  9.1 8.9     Intake/Output Summary (Last 24 hours) at 11/21/2023 1004 Last data filed at 11/21/2023 9277 Gross per 24 hour  Intake 1354 ml  Output 1600 ml  Net -246 ml        Physical Exam: Vital Signs Blood pressure (!) 146/86, pulse 71, temperature 98 F (36.7 C), resp. rate 18, height 5' 7 (1.702 m), weight 82.5 kg, SpO2 98%.       Constitutional: No distress . Vital signs reviewed. HEENT: NCAT, EOMI, oral membranes moist Neck: supple Cardiovascular: RRR without murmur. No JVD    Respiratory/Chest: CTA Bilaterally without wheezes or rales. Normal effort    GI/Abdomen: BS +, non-tender, non-distended Ext: no clubbing, cyanosis, or edema Psych: pleasant and cooperative  Skin: No evidence of breakdown, no evidence of rash  Neuro:  Decreased to light touch from elbows to finger tips B/L. L>R Musculoskeletal:  General: Normal range of motion.     Cervical back: Neck supple. No tenderness.     Comments: RUE- biceps/triceps 4/5; WE 2-/5; Grip 3-/5 and FA 0/5 LUE- biceps and triceps 4/5; WE 0-1/5; Grip 2+/5 and FA 1/5 RLE- HF 4-/5; KE 4/5; KF 4-/5; DF 2/5 and PF 2+/5 LLE- HF 4-/5; KE 4/5; KF 4-/5 DF 1/5 and PF 2-/5 A little flaccid in UEs.   Assessment/Plan: 1. Functional  deficits which require 3+ hours per day of interdisciplinary therapy in a comprehensive inpatient rehab setting. Physiatrist is providing close team supervision and 24 hour management of active medical problems listed below. Physiatrist and rehab team continue to assess barriers to discharge/monitor patient progress toward functional and medical goals  Care Tool:  Bathing    Body parts bathed by patient: Right arm, Left lower leg, Face, Left arm, Chest, Abdomen, Front perineal area, Buttocks, Right upper leg, Left upper leg, Right lower leg         Bathing assist Assist Level: Supervision/Verbal cueing     Upper Body Dressing/Undressing Upper body dressing   What is the patient wearing?: Pull over shirt    Upper body assist Assist Level: Moderate Assistance - Patient 50 - 74%    Lower Body Dressing/Undressing Lower body dressing      What is the patient wearing?: Underwear/pull up, Pants     Lower body assist Assist for lower body dressing: Maximal Assistance - Patient 25 - 49%     Toileting Toileting    Toileting assist Assist for toileting: Moderate Assistance - Patient 50 -  74%     Transfers Chair/bed transfer  Transfers assist     Chair/bed transfer assist level: Moderate Assistance - Patient 50 - 74% Chair/bed transfer assistive device: Walker, Archivist   Ambulation assist      Assist level: Minimal Assistance - Patient > 75% Assistive device: Walker-rolling Max distance: 15   Walk 10 feet activity   Assist     Assist level: Minimal Assistance - Patient > 75% Assistive device: Walker-rolling   Walk 50 feet activity   Assist    Assist level: Minimal Assistance - Patient > 75% Assistive device: Walker-rolling    Walk 150 feet activity   Assist Walk 150 feet activity did not occur:  (fatigue and imbalance)         Walk 10 feet on uneven surface  activity   Assist Walk 10 feet on uneven surfaces  activity did not occur:  (fatigue and imbalance)         Wheelchair     Assist Is the patient using a wheelchair?: Yes Type of Wheelchair: Manual Wheelchair activity did not occur: Safety/medical concerns  Wheelchair assist level: Supervision/Verbal cueing Max wheelchair distance: 150    Wheelchair 50 feet with 2 turns activity    Assist    Wheelchair 50 feet with 2 turns activity did not occur:  (fatigue and imbalance)   Assist Level: Supervision/Verbal cueing   Wheelchair 150 feet activity     Assist  Wheelchair 150 feet activity did not occur:  (fatigue and imbalance)   Assist Level: Supervision/Verbal cueing   Blood pressure (!) 146/86, pulse 71, temperature 98 F (36.7 C), resp. rate 18, height 5' 7 (1.702 m), weight 82.5 kg, SpO2 98%.  Medical Problem List and Plan: 1. 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   -d/c 2/25  -Continue CIR therapies including PT, OT     IVIG- completed  -Bilateral resting hand splints    2.  Antithrombotics: -DVT/anticoagulation:  Pharmaceutical: Lovenox              -antiplatelet therapy: aspirin  81 mg daily   3. Pain Management: Tylenol  as needed             -continue gabapentin  900 mg TID and 900 mg daily PRN             -continue Percocet 5/325 q 4 hours PRN  -Continue Cymbalta  60 mg daily   -Consider trial of Lyrica to replace gabapentin    2/6- will increase Duloxetine  to 90 mg daily and stop Zoloft  so can do so.   2/8 pain improving. No changes today 4. Mood/Behavior/Sleep: LCSW to evaluate and provide emotional support             -continue Cymbalta  60 mg daily             -continue Zoloft  25 mg  daily             -continue clonazepam   0.5 mg BID prn             -antipsychotic agents: n/a  -Patient requested increase of clonazepam  due to anxiety.  He takes this medication chronically PDMP reviewed, will increase temporarily to 1 mg twice daily as needed  -1/31 pt reports anxiety is improved   2/4- pt reports anxiety is better due to Prednisone  same with irritability.  5. Neuropsych/cognition: This patient is capable of making decisions on his own behalf.   6. Skin/Wound Care: Routine skin care checks   7. Fluids/Electrolytes/Nutrition: Routine Is and Os and follow-up chemistries   8: Hypertension: monitor TID and prn             -continue Avapro  300 mg daily             -continue Toprol -XL 25 mg daily  -1/31 controlled, continue current      11/21/2023    5:19 AM 11/20/2023    8:13 PM 11/20/2023    1:16 PM  Vitals with BMI  Systolic 146 122 867  Diastolic 86 77 84  Pulse 71 80 86  2/1 Will reduce losartan to 75 mg, start amlodipine  5 mg and monitor- see no. 14 BP controlled will try on amlodipine  alone 2/3 BP controlled continue current regimen   2/4- BP a little elevated DBP- at 90, but otherwise well controlled- con't regimen  2/8 bp a little higher this morning after stopping norvasc . Obsv for now. Will discuss with him before I resume anything.  9: Hypothyroidism: continue Synthroid  125 mcg daily   10: Tobacco use: cessation counseling -Nicorette  gum as needed (not using)             -? cough>> on scheduled Mucinex -DM BID   11: CIDP -continue prednisone  40 mg daily -daily VC and NIF (? end date?) -follow-up Dr. Onita -1/30 respiratory function- NIF >-40 and IS 2500, stable. Continue to monitor -2/3 NIF/IS stable   2/4- NIF's VC OK- on ICS actually- spoke with Dr Onita- she wants us  to do another dosing of IVIG over a total of 4 days- 2g/kg total dose over 4 days- will arrange with Pharmacy- spoke with them as well- will also increase Prednisone  to 60 mg daily- since I  think it was decreased- supposed to be on until f/u with Dr Onita- needs BMP daily per pharmacy while on IVIG.   2/5- BMP looks great- Sx's about the same, maybe feels  a little weaker- will recheck strength Thursday- got 1 dose IVIG yesterday- due this am again for next 3 days.   2/6- BMP looks good- strength a little better in grip and triceps, but not in WE- con't regimen  2/7- slightly better, but no major differences- on last day of IVIG- NIF -40- and VC 40  2/8 clinically stable from respiratory standpoint 12: Leukocytosis: likely steroid induced, improving; no fever              -follow-up CBC with differential Thursday 1/30 and q Monday  1/3 WBC elevated but stable at 15,  likely steroid related    Latest Ref Rng & Units 11/16/2023    5:42 AM 11/12/2023    5:52 AM 11/09/2023    7:03 AM  CBC  WBC 4.0 - 10.5 K/uL 15.0  15.0  15.7   Hemoglobin 13.0 - 17.0 g/dL 86.1  86.0  86.2   Hematocrit 39.0 - 52.0 % 40.9  41.5  40.7   Platelets 150 - 400 K/uL 397  412  341     13.  Elevated ALT  -2/3 improved to 48 today    14. Cold feeling in b/l legs.  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  EXTR warm, pulses are normal, do not think he needs arterial Dopplers.  Likely autonomic, trial calcium  channel blocker  2/5- so far, no improvement with Norvasc  5 mg daily-   2/6- pt emphatic that it's the worst part of his pain- he also describes feet feeling wet - but are dry and warmish to touch- will increase Duloxetine  to 90 mg daily and sotp Zoloft .   2/7- pt agreed to change- said nerve pain/cold in feet slightly better already- thinks Norvasc  made him feel fatigued, so wants to stop   2/8 made no mention of cold feet today---will f/u with him tomorrow. Same plan for now.      LOS: 11 days A FACE TO FACE EVALUATION WAS PERFORMED  Jonathan Luna 11/21/2023, 10:04 AM

## 2023-11-21 NOTE — Progress Notes (Signed)
 Occupational Therapy Session Note  Patient Details  Name: Jonathan Luna MRN: [id number] Date of Birth: 1966-07-31  Today's Date: 11/21/2023 OT Individual Time: 1140-1220 OT Individual Time Calculation (min): 40 min    Short Term Goals: Week 2:  OT Short Term Goal 1 (Week 2): Pt will complete LB dressing at Mod A with AE as necessary OT Short Term Goal 2 (Week 2): Pt will complete donning/doffing wrist stabilizing u-cuff for feeding/grooming as necessary with Min A OT Short Term Goal 3 (Week 2): Pt will complete grooming with Min A with AE as necessary  Skilled Therapeutic Interventions/Progress Updates:For this patient's 2nd session this date, as he stated, sometimes when I go to do something, my arms shake.  He asked to work on back exercises as she stated they may help stop the shakey arms when I go to reach out to do things.  Patient was provided education and skilled therapy services regarding core, back, scapular, trunk, strenghtening, motor control and other information related to  his arms shaking and overall function and motor control as well as hands and digits.   Patient also demonstrated exercises that were provided prior based on my memory.         Lunch arrived and patient required assitance for tray set up , in cluding opening packages and condiments, cutting his hamburger into quarter sized pieces he could pick up and complete bilateral hand to mouth technique.   Patient stated he did not want to utilize the universal cuff to eat his burger because he could use both hands and further stated he could dip his fries in ketchup with his hands.  Patient was left seated in his w/c eating lunch with call bell and phone within reach.  Patient will benefit from continued OT services to increase motor control, fine motor skills, strengthening, balance, transfers, safety awareness, and many other treatment experiences to help increase independence and address goals.  Continue OT Plan  of Care  Therapy Documentation Precautions:  Precautions Precautions: Fall Restrictions Weight Bearing Restrictions Per Provider Order: No  Pain:denied     Therapy/Group: Individual Therapy  [facility] 11/21/2023, 5:19 PM

## 2023-11-21 NOTE — Progress Notes (Signed)
 Occupational Therapy Session Note  Patient Details  Name: Jonathan Luna MRN: [id number] Date of Birth: 18-May-1966  Today's Date: 11/21/2023 OT Individual Time: 0802-0900 OT Individual Time Calculation (min): 58 min    Short Term Goals: Week 2:  OT Short Term Goal 1 (Week 2): Pt will complete LB dressing at Mod A with AE as necessary OT Short Term Goal 2 (Week 2): Pt will complete donning/doffing wrist stabilizing u-cuff for feeding/grooming as necessary with Min A OT Short Term Goal 3 (Week 2): Pt will complete grooming with Min A with AE as necessary  Skilled Therapeutic Interventions/Progress Updates: Patient in bed upon approach for OT services and concurred to participate with offering OT services.    She stated, I have not had a shower in 4 days.   Please can you help me shower?  Focus of session as follows:  Bed to w/c transfer = extra time, guarded for safety and smooth controlled movements  W/c to toilet transfers via grab bar and w/c= min A stand pivot  Toileting = Mod A for pericleansing  W/c to/from tub transfer bench = CGA into/MinA out of wet shower area via grab bars and tub transfer bench  UB bathing = mod a for areas patient could not reach due to wrist drop and decreased bilateral UE hand drink.   Patient tended to use wash clothing holding on with both hands to manipulate.    LB bathing seated on tub tranfer bench= Mod A.  He placed soapy wash cloth under buttocks to wash rear periarea and was able to use both hands together to wash front periarea while seated.  He crossed 1 leg over the other to wash lower leg and required assistance to keep leg crossed due to wet soapy skin.  He exhibited good safety awareness during the process.  UB dressing = Min VCs and mod A for technique to threads UEs 1st instead of attempting to don over head 1st and assist to pull down shirt on his left side  LB dressing = Max A. Lateral leans and sit to stand to pull up   Patient was very  fatigued after toileting and even more so during and after showering as evidenced by making sounds with his mouth and statements such as, Wooh but this is good.  I need this work.  Patient did not exhibit signs of safety distress, SOB, or other critical concerns.  Oral care in w/c at sink=Mod A  Patient fatigued; so this clinician set up his toothbrush.  He initiated cupping his hands together to hold and manipuate his toothbrush to clean teeth, gums, tongue, inside walls of mouth.  Patient brushed a long time in various areas of his mouth.  At the end of the session, the patient was left seated in his w/c beside his bed where he waited for his next therapy session to be.  Call bell and phone were within his reach.j  Patient will benefit from continued OT services to increase safe, independence, endurance, strengthening, transfer training, and balance to address his therapy goals.  Continue patient's Plan of Care       Therapy Documentation Precautions:  Precautions Precautions: Fall Restrictions Weight Bearing Restrictions Per Provider Order: No  Pain:denied   Therapy/Group: Individual Therapy  Waneta Medici [facility] 11/21/2023, 5:02 PM

## 2023-11-21 NOTE — Plan of Care (Signed)
  Problem: Consults Goal: RH SPINAL CORD INJURY PATIENT EDUCATION Description:  See Patient Education module for education specifics.  Outcome: Progressing   Problem: RH SAFETY Goal: RH STG ADHERE TO SAFETY PRECAUTIONS W/ASSISTANCE/DEVICE Description: STG Adhere to Safety Precautions With  cues Assistance/Device. Outcome: Progressing   Problem: RH PAIN MANAGEMENT Goal: RH STG PAIN MANAGED AT OR BELOW PT'S PAIN GOAL Description: < 4 with prns Outcome: Progressing   Problem: RH KNOWLEDGE DEFICIT SCI Goal: RH STG INCREASE KNOWLEDGE OF SELF CARE AFTER SCI Description: Patient and spouse will be able to manage care at discharge using educational resources for medications and dietary modification independently.  Outcome: Progressing

## 2023-11-21 NOTE — Progress Notes (Signed)
 Physical Therapy Session Note  Patient Details  Name: Jonathan Luna MRN: [id number] Date of Birth: [birth year]  Today's Date: 11/21/2023 PT Individual Time: 0917-1015 PT Individual Time Calculation (min): 58 min   Short Term Goals: Week 1:  PT Short Term Goal 1 (Week 1): Pt will perform SBA sit<>stand with LRAD PT Short Term Goal 1 - Progress (Week 1): Progressing toward goal PT Short Term Goal 2 (Week 1): Pt will ambulate >114ft with two turns with SBA and LRAD PT Short Term Goal 2 - Progress (Week 1): Progressing toward goal PT Short Term Goal 3 (Week 1): Pt will perform curb step with CGA and LRAD PT Short Term Goal 3 - Progress (Week 1): Progressing toward goal  Skilled Therapeutic Interventions/Progress Updates:  Pt was seen bedside in the am. Donned B AFOs and shoes for time management. Pt transported to rehab gym. Pt transferred sit to stand in parallel bars with c/g to min A and verbal cues. Pt performed hip flex and LAQs, 10 reps each focusing on muscular control with exercises. Pt transferred sit to stand with with eva walker and min A and verbal cues. Placed 2 lbs on B LEs to increased proprioceptive input. Pt ambulated 25 feet x 2, 50 feet x 3 with eva walker and min A with verbal cues. While ambulating verbal cues for LE control and sequencing. Pt returned to room and left sitting up in w/c with all needs within reach.   Therapy Documentation Precautions:  Precautions Precautions: Fall Restrictions Weight Bearing Restrictions Per Provider Order: No General:   Pain: Pain Assessment Pain Scale: 0-10 Pain Score: 8   Therapy/Group: Individual Therapy  Emelie Newsom G 11/21/2023, 12:21 PM

## 2023-11-21 NOTE — Progress Notes (Signed)
 NIF -40 and greater x2 with great pt effort VC 40L

## 2023-11-21 NOTE — Consult Note (Signed)
 Neuropsychological Consultation Comprehensive Inpatient Rehab   Patient:   Jonathan Luna   DOB:   [birth year]  MR Number:  [id number]  Location:  [facility] [address] CENTER A [address] [place] [address] Dept: [phone] Loc: [phone]           Date of Service:   11/20/2023  Start Time:   9 AM End Time:   10 AM  Provider/Observer:  Norleen Asa, Psy.D.       Clinical Neuropsychologist       Billing Code/Service: [id number]  Reason for Service:    Jonathan Luna is a 58 year old male referred for neuropsychological consultation during his current admission onto CIR.  Patient had recently been seeing at [facility] where I also saw him individually having been discharged from the unit on 10/20/2023.  At that point he had been diagnosed with acute inflammatory demyelinating polyneuropathy and was treated with a 5-day course of IVIG patient had improved prior to discharge but did continue with some issues related to numbness.  Patient presented again to the emergency department on 10/25/2023 with exam noting decreased sensation to bilateral side of face and cheek as well as decreased sensation to the bilateral forearms and hands.  Patient responded well to intervention and was discharged with neurology follow-up.  On 1/16 patient saw his neurologist Dr. Onita who noted worsening symptoms the patient agreed to admission for Plex and was ordered for 5 treatments as well as CBC and NIF every 12 hours.  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  Diagnostic considerations including changing diagnosis to chronic status.  Patient does have a past history that includes chronic pain, panic attacks previously.  Patient continues with his home medications including clonazepam  and duloxetine .  Patient is compliant with medications.  As noted, I seen the  patient during his previous admission.  Patient clearly remembered our previous visit.  Patient had significant worsening arm function and control of upper body motor functions been back previous visit.  The patient was almost unable to move his hand up and down at risk and had little to no control over his fingers.  Right arm greater than left arm deficits.  Cursory check of grip strength was clear for significant impairments and the patient could not sleep at all with his right arm and had difficulty even presenting his hand.  The patient acknowledged a great deal of worry about his capacity to improve and what to expect as follows treatment he did not seem overwhelmed or experiencing such anxiety to explain his clearly worsening of symptoms.  Patient was tearful at times discussing his inability to help take care of his wife and having difficulty coping with her emotional response to his medical status.  His wife is blind and patient concerned about his inability with motor function and her blindness making nice very difficult with both of them.  HPI for the current admission:    HPI: Jonathan Luna is a 58 year old R handed male who was discharged from CIR on 10/20/2023 due to acute inflammatory demyelinating polyneuropathy treated with 5 day course of IVIG. He has had issues with ongoing numbness. He presented to the ED on 10/25/2023 where exam was notable for decreased sensation to bilateral side of face and cheek as well as decrease sensation to bilateral forearms and hands. He performed well on NIF  and  was able to ambulate with RW. Discharged with neurology follow-up as outpatient on 1/16. At that visit with Dr. Onita, he agreed to admission to the hospital for PLEX and admitted by TRH. Dr. Michaela consulted. PLEX ordered for 5 treatment as well as VC and NIF every 12 hours.  Medically stabilized.  Labs stable.  Tolerating diet.  NIF greater than -40 cm of water and incentive spirometry 2500 mL.  Requiring  min assist for basic self-care skills and contact-guard assist for mobility.The patient requires inpatient medicine and rehabilitation evaluations and services for ongoing dysfunction secondary to acute exacerbation of inflammatory demyelinating polyneuropathy.   Past medical history difficult for hypertension, hyperlipidemia, hypothyroidism chronic pain, depression, panic attacks, juvenile cataracts, CAP on 08/28/2023.   Medical History:   Past Medical History:  Diagnosis Date   Allergy     Anxiety    Arthritis    Cataract    Community acquired pneumonia 08/28/2023   Depression    Detached retina    Heart murmur    History of bilateral cataract extraction    Hyperlipidemia    Hypothyroidism    Low back pain    Panic attack    Panic attacks    Pneumonia of right upper lobe due to infectious organism 08/18/2023         Patient Active Problem List   Diagnosis Date Noted   CIDP (chronic inflammatory demyelinating polyneuropathy) (HCC) 10/29/2023   Coping style affecting medical condition 10/18/2023   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/05/2023   AIDP (acute inflammatory demyelinating polyneuropathy) (HCC) 09/30/2023   Paresthesia 09/22/2023   Gait abnormality 09/22/2023   Tremor 09/04/2023   DOE (dyspnea on exertion) 09/04/2023   Peripheral neuropathy 08/28/2023   Tachycardia 08/18/2023   Hypogonadism male 10/24/2021   Vitamin D  deficiency disease 09/08/2018   Essential hypertension 09/07/2018   Long-term current use of opiate analgesic 04/07/2018   Low back pain 05/08/2016   Allergic rhinitis 12/13/2012   Chronic pain disorder 09/29/2012   Routine general medical examination at a health care facility 10/08/2011   Irritable bowel syndrome 10/16/2010   DDD (degenerative disc disease), lumbar 04/09/2010   Hypothyroidism 05/03/2009   Hyperlipidemia with target LDL less than 130 05/03/2009   Panic anxiety syndrome 05/03/2009    Behavioral  Observation/Mental Status:   Jonathan Luna  presents as a 58 y.o.-year-old Right handed Caucasian Male who appeared his stated age. his dress was Appropriate and he was Well Groomed and his manners were Appropriate to the situation.  his participation was indicative of Appropriate behaviors.  There were physical disabilities noted.  he displayed an appropriate level of cooperation and motivation.    Interactions:    Active Appropriate  Attention:   within normal limits and attention span and concentration were age appropriate  Memory:   within normal limits; recent and remote memory intact  Visuo-spatial:   not examined  Speech (Volume):  normal  Speech:   normal; normal  Thought Process:  Coherent and Relevant  Coherent, Logical, and Organized  Though Content:  WNL; not suicidal and not homicidal  Orientation:   person, place, time/date, and situation  Judgment:   Good  Planning:   Good  Affect:    Anxious  Mood:    Anxious and Dysphoric  Insight:   Good  Intelligence:   high  Psychiatric History:  Patient with prior history related to anxiety symptoms including history of panic and anxiety symptoms.  Family Med/Psych History:  Family History  Problem Relation Age of Onset   Breast cancer Mother    Hypertension Father    Heart disease Father    Asthma Father    Emphysema Father        smoker for 45 years   Diabetes Brother    Hyperlipidemia Brother     Impression/DX:   JAARON OLESON is a 58 year old male referred for neuropsychological consultation during his current admission onto CIR.  Patient had recently been seeing at [facility] where I also saw him individually having been discharged from the unit on 10/20/2023.  At that point he had been diagnosed with acute inflammatory demyelinating polyneuropathy and was treated with a 5-day course of IVIG patient had improved prior to discharge but did continue with some issues related to numbness.  Patient presented again to the  emergency department on 10/25/2023 with exam noting decreased sensation to bilateral side of face and cheek as well as decreased sensation to the bilateral forearms and hands.  Patient responded well to intervention and was discharged with neurology follow-up.  On 1/16 patient saw his neurologist Dr. Onita who noted worsening symptoms the patient agreed to admission for Plex and was ordered for 5 treatments as well as CBC and NIF every 12 hours.  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  Diagnostic considerations including changing diagnosis to chronic status.  Patient does have a past history that includes chronic pain, panic attacks previously.  Patient continues with his home medications including clonazepam  and duloxetine .  Patient is compliant with medications.  As noted, I seen the patient during his previous admission.  Patient clearly remembered our previous visit.  Patient had significant worsening arm function and control of upper body motor functions been back previous visit.  The patient was almost unable to move his hand up and down at risk and had little to no control over his fingers.  Right arm greater than left arm deficits.  Cursory check of grip strength was clear for significant impairments and the patient could not sleep at all with his right arm and had difficulty even presenting his hand.  The patient acknowledged a great deal of worry about his capacity to improve and what to expect as follows treatment he did not seem overwhelmed or experiencing such anxiety to explain his clearly worsening of symptoms.  Patient was tearful at times discussing his inability to help take care of his wife and having difficulty coping with her emotional response to his medical status.  His wife is blind and patient concerned about his inability with motor function and her blindness making nice very  difficult with both of them.  Disposition/Plan:  Today's visit primarily focused on coping and adjustment issues with readmission to CIR and extended hospital stay with significant further disruption to motor functions.          Electronically Signed   _______________________ Norleen Asa, Psy.D. Clinical Neuropsychologist

## 2023-11-22 MED ORDER — AMLODIPINE BESYLATE 2.5 MG PO TABS
2.5000 mg | ORAL_TABLET | Freq: Every day | ORAL | Status: DC
  Administered 2023-11-22 – 2023-11-29 (×8): 2.5 mg via ORAL
  Filled 2023-11-22 (×8): qty 1

## 2023-11-22 NOTE — Progress Notes (Signed)
 Physical Therapy Session Note  Patient Details  Name: Jonathan Luna MRN: [id number] Date of Birth: 1966-08-31  Today's Date: 11/22/2023 PT Individual Time: 0800-0900 PT Individual Time Calculation (min): 60 min   Short Term Goals: Week 2:  PT Short Term Goal 1 (Week 2): Pt will require CGA with sit<>stand with LRAD PT Short Term Goal 2 (Week 2): Pt will require CGA with gait x 40 ft with LRAD PT Short Term Goal 3 (Week 2): Pt will require min A with steps x 2 with HR's  Skilled Therapeutic Interventions/Progress Updates:      Therapy Documentation Precautions:  Precautions Precautions: Fall Restrictions Weight Bearing Restrictions Per Provider Order: No  Pt agreeable to PT session with emphasis on gait training with multi-modal cueing to increase independence with functional mobility. Pt without reports of pain, dependent for donning AFO/shoe complex and CGA with sit<>stand and stand pivot with RW to w/c. Dependent transport to main gym hallway and pt ambulated following distances with CGA with w/c follow and mirror for visual cue:   1: 110' with PLS AFO   2: 64' with R GRAFO and Left PLS AFO (demonstrates excessive hyperextension)   3: 10' with PLS AFO's and Right theraband as tactile cue to decrease hyperextension Pt presents with improved mobility status following IVIG and will benefit from continued gait training and new AFO consult to address LE weakness. Pt returned to room, left seated at bedside with all needs in reach.     Therapy/Group: Individual Therapy  [name] [name] PT, DPT  11/22/2023, 12:40 PM

## 2023-11-22 NOTE — Progress Notes (Signed)
 NIF/VC performed with good effort. VC 4.4 L NIF > -40 cmH2O. Gauge only goes to -40 & he did that with ease.

## 2023-11-22 NOTE — Plan of Care (Signed)
  Problem: Consults Goal: RH SPINAL CORD INJURY PATIENT EDUCATION Description:  See Patient Education module for education specifics.  Outcome: Progressing   Problem: RH SAFETY Goal: RH STG ADHERE TO SAFETY PRECAUTIONS W/ASSISTANCE/DEVICE Description: STG Adhere to Safety Precautions With  cues Assistance/Device. Outcome: Progressing   Problem: RH PAIN MANAGEMENT Goal: RH STG PAIN MANAGED AT OR BELOW PT'S PAIN GOAL Description: < 4 with prns Outcome: Progressing   Problem: RH KNOWLEDGE DEFICIT SCI Goal: RH STG INCREASE KNOWLEDGE OF SELF CARE AFTER SCI Description: Patient and spouse will be able to manage care at discharge using educational resources for medications and dietary modification independently.  Outcome: Progressing

## 2023-11-22 NOTE — Progress Notes (Signed)
 Occupational Therapy Session Note  Patient Details  Name: NORRIN SHREFFLER MRN: [id number] Date of Birth: [birth year]  Today's Date: 11/22/2023 OT Individual Time: 1300-1345 OT Individual Time Calculation (min): 45 min    Short Term Goals: Week 2:  OT Short Term Goal 1 (Week 2): Pt will complete LB dressing at Mod A with AE as necessary OT Short Term Goal 2 (Week 2): Pt will complete donning/doffing wrist stabilizing u-cuff for feeding/grooming as necessary with Min A OT Short Term Goal 3 (Week 2): Pt will complete grooming with Min A with AE as necessary  Skilled Therapeutic Interventions/Progress Updates:      Therapy Documentation Precautions:  Precautions Precautions: Fall Restrictions Weight Bearing Restrictions Per Provider Order: No General: "[employer]!" Pt seated in W/C finishing lunch upon OT arrival, agreeable to OT. Wife Darice present  Pain: no pain reported  ADL: Pt transferred from W/C>bed at [facility] A for stand pivot without AD and no LOB/SOB. Pt showing OT new BUE wrist splinting, using splints for functional grasp/release to retrieve water cup at SBA, pt able to complete donning Lt splint, required assistance to don Rt splint. Pt reports increased independence with [facility] tasks and ADLs from use of splinting.  Other Treatments: Pt wife educated on wrist drop, pt current diagnosis, current modifications, splinting, washing schedule of prefab nighttime resting hand splints and dynamic splints and current plan of care for OT. Pt wife very receptive to information and eager to learn.   Pt supine in bed with bed alarm activated, 2 bed rails up, call light within reach and 4Ps assessed.   Therapy/Group: Individual Therapy  Credit Hoe, OTD, OTR/L 11/22/2023, 4:20 PM

## 2023-11-22 NOTE — Progress Notes (Signed)
 Occupational Therapy Session Note  Patient Details  Name: Jonathan Luna MRN: [id number] Date of Birth: 01-29-1966  Today's Date: 11/22/2023 OT Individual Time: 1000-1045 OT Individual Time Calculation (min): 45 min    Short Term Goals: Week 1:  OT Short Term Goal 1 (Week 1): Pt will complete grooming with SBA with AE as necessary OT Short Term Goal 1 - Progress (Week 1): Revised due to lack of progress OT Short Term Goal 2 (Week 1): Pt will complete LB dressing at Min A with AS as necessary OT Short Term Goal 2 - Progress (Week 1): Revised due to lack of progress OT Short Term Goal 3 (Week 1): Pt will complete donning/doffing wrist stabilizing u-cuff for feeding/grooming as necessary with SBA OT Short Term Goal 3 - Progress (Week 1): Revised due to lack of progress  Skilled Therapeutic Interventions/Progress Updates:    Patient seated at w/c LOF speaking with nutrition to make adjustments with his food selection at the time of arrival.  The pt indicated that he rested well last night and that he presented with a pain response of 7 on 0-10 for chronic low back pain. Patient indicated that nursing was aware. The patient completed a simulated task in UB dressing using theraband at [facility] he was MaxA with LB dressing secondary to  wrist drop impacting  effect I've carryover. The pt went to complete UB exercises using the red theraband 2 sets of 10  for wrist curls. The pt was encouraged to incorporate relaxation breathing.  The pt was able to squeeze the sponge 2 sets of 10 with bilateral hands followed by the completion of a  theraband exercise for the the forearm 2 sets of 10.  The pt was provided with theraband to use for simulated task in UB/LB dressing and exercise to improve his performance.  At the end of the session, the pt remained at w/c LOF with all additional needs addressed and his call light and bedside table within reach. .   Therapy Documentation Precautions:  Precautions Precautions:  Fall Restrictions Weight Bearing Restrictions Per Provider Order: No  Therapy/Group: Individual Therapy  Elvera JONETTA Mace 11/22/2023, 10:47 AM

## 2023-11-22 NOTE — Progress Notes (Signed)
 PROGRESS NOTE   Subjective/Complaints:  Feels stronger and has more energy since completing last round of IVIG. Feet and hands still cold at times.   ROS: Patient denies fever, rash, sore throat, blurred vision, dizziness, nausea, vomiting, diarrhea, cough, shortness of breath or chest pain,  headache, or mood change.   Objective:   No results found. No results for input(s): WBC, HGB, HCT, PLT in the last 72 hours.   Recent Labs    11/20/23 0612  NA 138  K 4.1  CL 103  CO2 27  GLUCOSE 80  BUN 14  CREATININE 0.87  CALCIUM  8.9     Intake/Output Summary (Last 24 hours) at 11/22/2023 0940 Last data filed at 11/22/2023 0754 Gross per 24 hour  Intake 536 ml  Output 1800 ml  Net -1264 ml        Physical Exam: Vital Signs Blood pressure 132/83, pulse (!) 57, temperature 97.8 F (36.6 C), temperature source Oral, resp. rate 18, height 5' 7 (1.702 m), weight 82.5 kg, SpO2 100%.       Constitutional: No distress . Vital signs reviewed. HEENT: NCAT, EOMI, oral membranes moist Neck: supple Cardiovascular: RRR without murmur. No JVD    Respiratory/Chest: CTA Bilaterally without wheezes or rales. Normal effort    GI/Abdomen: BS +, non-tender, non-distended Ext: no clubbing, cyanosis, or edema Psych: pleasant and cooperative  Skin: No evidence of breakdown, no evidence of rash  Neuro:  Decreased to light touch from elbows to finger tips B/L. L>R Musculoskeletal:  General: Normal range of motion.     Cervical back: Neck supple. No tenderness.     Comments: RUE- biceps/triceps 4/5; WE 2-/5; Grip 3-/5 and FA 0/5 LUE- biceps and triceps 4/5; WE 0-1/5; Grip 2+/5 and FA 1/5 RLE- HF 4-/5; KE 4/5; KF 4-/5; DF 2/5 and PF 2+/5 LLE- HF 4-/5; KE 4/5; KF 4-/5 DF 1/5 and PF 2-/5 A little flaccid in UEs.   Assessment/Plan: 1. Functional deficits which require 3+ hours per day of interdisciplinary therapy in a  comprehensive inpatient rehab setting. Physiatrist is providing close team supervision and 24 hour management of active medical problems listed below. Physiatrist and rehab team continue to assess barriers to discharge/monitor patient progress toward functional and medical goals  Care Tool:  Bathing    Body parts bathed by patient: Right arm, Left lower leg, Face, Left arm, Chest, Abdomen, Front perineal area, Buttocks, Right upper leg, Left upper leg, Right lower leg         Bathing assist Assist Level: Supervision/Verbal cueing     Upper Body Dressing/Undressing Upper body dressing   What is the patient wearing?: Pull over shirt    Upper body assist Assist Level: Moderate Assistance - Patient 50 - 74%    Lower Body Dressing/Undressing Lower body dressing      What is the patient wearing?: Underwear/pull up, Pants     Lower body assist Assist for lower body dressing: Maximal Assistance - Patient 25 - 49%     Toileting Toileting    Toileting assist Assist for toileting: Moderate Assistance - Patient 50 - 74%     Transfers Chair/bed transfer  Transfers assist  Chair/bed transfer assist level: Moderate Assistance - Patient 50 - 74% Chair/bed transfer assistive device: Walker, Archivist   Ambulation assist      Assist level: Minimal Assistance - Patient > 75% Assistive device: Walker-Eva Max distance: 50   Walk 10 feet activity   Assist     Assist level: Minimal Assistance - Patient > 75% Assistive device: Walker-Eva   Walk 50 feet activity   Assist    Assist level: Minimal Assistance - Patient > 75% Assistive device: Walker-Eva    Walk 150 feet activity   Assist Walk 150 feet activity did not occur:  (fatigue and imbalance)         Walk 10 feet on uneven surface  activity   Assist Walk 10 feet on uneven surfaces activity did not occur:  (fatigue and imbalance)         Wheelchair     Assist Is  the patient using a wheelchair?: Yes Type of Wheelchair: Manual Wheelchair activity did not occur: Safety/medical concerns  Wheelchair assist level: Supervision/Verbal cueing Max wheelchair distance: 150    Wheelchair 50 feet with 2 turns activity    Assist    Wheelchair 50 feet with 2 turns activity did not occur:  (fatigue and imbalance)   Assist Level: Supervision/Verbal cueing   Wheelchair 150 feet activity     Assist  Wheelchair 150 feet activity did not occur:  (fatigue and imbalance)   Assist Level: Supervision/Verbal cueing   Blood pressure 132/83, pulse (!) 57, temperature 97.8 F (36.6 C), temperature source Oral, resp. rate 18, height 5' 7 (1.702 m), weight 82.5 kg, SpO2 100%.  Medical Problem List and Plan: 1. 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   -d/c 2/25  -Continue CIR therapies including PT, OT     IVIG- completed--feeling better since completion!!  -Bilateral resting hand splints    2.  Antithrombotics: -DVT/anticoagulation:  Pharmaceutical: Lovenox              -antiplatelet therapy: aspirin  81 mg daily   3. Pain Management: Tylenol  as needed             -continue gabapentin  900 mg TID and 900 mg daily PRN             -continue Percocet 5/325 q 4 hours PRN  -Continue Cymbalta  60 mg daily   -Consider trial of Lyrica to replace gabapentin    2/6- will increase Duloxetine  to 90 mg daily and stop Zoloft  so can do so.   2/9 pain improving. Continue plan above 4. Mood/Behavior/Sleep: LCSW to evaluate and provide emotional support             -continue Cymbalta  60 mg daily             -continue Zoloft  25 mg daily              -continue clonazepam  0.5 mg BID prn             -  antipsychotic agents: n/a  -Patient requested increase of clonazepam  due to anxiety.  He takes this medication chronically PDMP reviewed, will increase temporarily to 1 mg twice daily as needed  -1/31 pt reports anxiety is improved   2/4- pt reports anxiety is better due to Prednisone  same with irritability.  5. Neuropsych/cognition: This patient is capable of making decisions on his own behalf.   6. Skin/Wound Care: Routine skin care checks   7. Fluids/Electrolytes/Nutrition: Routine Is and Os and follow-up chemistries   8: Hypertension: monitor TID and prn             -continue Avapro  300 mg daily             -continue Toprol -XL 25 mg daily  -1/31 controlled, continue current      11/22/2023    5:05 AM 11/21/2023    8:43 PM 11/21/2023    1:43 PM  Vitals with BMI  Systolic 132 124 863  Diastolic 83 78 80  Pulse 57 81 87  2/1 Will reduce losartan to 75 mg, start amlodipine  5 mg and monitor- see no. 14 BP controlled will try on amlodipine  alone 2/3 BP controlled continue current regimen   2/4- BP a little elevated DBP- at 90, but otherwise well controlled- con't regimen  2/9 begin norvasc  2.5mg  daily for feet/hands per below 9: Hypothyroidism: continue Synthroid  125 mcg daily   10: Tobacco use: cessation counseling -Nicorette  gum as needed (not using)             -? cough>> on scheduled Mucinex -DM BID   11: CIDP -continue prednisone  40 mg daily -daily VC and NIF (? end date?) -follow-up Dr. Onita -1/30 respiratory function- NIF >-40 and IS 2500, stable. Continue to monitor -2/3 NIF/IS stable   2/4- NIF's VC OK- on ICS actually- spoke with Dr Onita- she wants us  to do another dosing of IVIG over a total of 4 days- 2g/kg total dose over 4 days- will arrange with Pharmacy- spoke with them as well- will also increase Prednisone  to 60 mg daily- since I think it was decreased- supposed to be on until f/u with Dr Onita- needs BMP daily  per pharmacy while on IVIG.   2/5- BMP looks great- Sx's about the same, maybe feels  a little weaker- will recheck strength Thursday- got 1 dose IVIG yesterday- due this am again for next 3 days.   2/6- BMP looks good- strength a little better in grip and triceps, but not in WE- con't regimen  2/7- slightly better, but no major differences- on last day of IVIG- NIF -40- and VC 40  2/8-9 clinically stable from respiratory standpoint 12: Leukocytosis: likely steroid induced, improving; no fever              -follow-up CBC with differential Thursday 1/30 and q Monday  1/3 WBC elevated but stable at 15,  likely steroid related    Latest Ref Rng & Units 11/16/2023    5:42 AM 11/12/2023    5:52 AM 11/09/2023    7:03 AM  CBC  WBC 4.0 - 10.5 K/uL 15.0  15.0  15.7   Hemoglobin 13.0 - 17.0 g/dL 86.1  86.0  86.2   Hematocrit 39.0 - 52.0 % 40.9  41.5  40.7   Platelets 150 - 400 K/uL 397  412  341     13.  Elevated ALT  -2/3 improved to 48 today    14. Cold feeling in b/l legs.  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  EXTR warm, pulses are normal, do not think he needs arterial Dopplers.  Likely autonomic, trial calcium  channel blocker  2/5- so far, no improvement with Norvasc  5 mg daily-   2/6- pt emphatic that it's the worst part of his pain- he also describes feet feeling wet - but are dry and warmish to touch- will increase Duloxetine  to 90 mg daily and sotp Zoloft .   2/7- pt agreed to change- said nerve pain/cold in feet slightly better already- thinks Norvasc  made him feel fatigued, so wants to stop   2/9 pt willing to try norvasc  again for sx. Feels that it helped   -will start low dose 2.5mg  daily      LOS: 12 days A FACE TO FACE EVALUATION WAS PERFORMED  Arthea ONEIDA Gunther 11/22/2023, 9:40 AM

## 2023-11-23 ENCOUNTER — Encounter: Payer: 59 | Admitting: Physical Medicine and Rehabilitation

## 2023-11-23 LAB — BASIC METABOLIC PANEL
Anion gap: 16 — ABNORMAL HIGH (ref 5–15)
BUN: 10 mg/dL (ref 6–20)
CO2: 25 mmol/L (ref 22–32)
Calcium: 9.7 mg/dL (ref 8.9–10.3)
Chloride: 101 mmol/L (ref 98–111)
Creatinine, Ser: 0.81 mg/dL (ref 0.61–1.24)
GFR, Estimated: >60 mL/min (ref >60)
Glucose, Bld: 77 mg/dL (ref 70–99)
Potassium: 3.6 mmol/L (ref 3.5–5.1)
Sodium: 142 mmol/L (ref 135–145)

## 2023-11-23 LAB — CBC
HCT: 40.8 % (ref 39.0–52.0)
Hemoglobin: 13.5 g/dL (ref 13.0–17.0)
MCH: 30.3 pg (ref 26.0–34.0)
MCHC: 33.1 g/dL (ref 30.0–36.0)
MCV: 91.5 fL (ref 80.0–100.0)
Platelets: 347 10*3/uL (ref 150–400)
RBC: 4.46 MIL/uL (ref 4.22–5.81)
RDW: 13 % (ref 11.5–15.5)
WBC: 11.9 10*3/uL — ABNORMAL HIGH (ref 4.0–10.5)
nRBC: 0 % (ref 0.0–0.2)

## 2023-11-23 NOTE — Progress Notes (Signed)
 Nurse observed patients call light come on while walking to room. Nurse entered room at 09:21pm, asking what could in help with and stating to patient I have your medication. Pt stated, "I would like my meds." Nurse restated that I have them with me. Patient started yelling at nurse that "it took 2 hours to get any medication, I want to talk to charg."  Nurse left room to tell charge that this patient wanted to speak with her. Reentered room and ask patient if he would like his medications while waiting for charge. Patient stated, "yes". Nurse going over routing medication and PRN meds that patient requested. Patient stated, " I didn't tell you that I wanted mucinex  brought in." Nurse educated patient, " I brought your PRNs that you requested and routine medication, you don't have to take what you do not want to, but I have to scan that you refused them." Patient refused raising his bed up to take medicine. Medication tolerated well. Nurse asked if he would like his pain medication when he could get it at 9:45pm he stated, "yes, ill still be up." Nurse  left room, call light within reach.

## 2023-11-23 NOTE — Progress Notes (Signed)
 Occupational Therapy Session Note  Patient Details  Name: Jonathan Luna MRN: [id number] Date of Birth: August 04, 1966  Today's Date: 11/23/2023 OT Individual Time: 4098-1191 & 1400-1446 OT Individual Time Calculation (min): 45 min & 46 min   Short Term Goals: Week 2:  OT Short Term Goal 1 (Week 2): Pt will complete LB dressing at Mod A with AE as necessary OT Short Term Goal 2 (Week 2): Pt will complete donning/doffing wrist stabilizing u-cuff for feeding/grooming as necessary with Min A OT Short Term Goal 3 (Week 2): Pt will complete grooming with Min A with AE as necessary  Skilled Therapeutic Interventions/Progress Updates:      Therapy Documentation Precautions:  Precautions Precautions: Fall Restrictions Weight Bearing Restrictions Per Provider Order: No  Session 1 General: "Hi there!" Pt seated in W/C at sink, handoff from prior OT upon OT arrival, agreeable to OT.  Pain:  8/10 general  pain reported, nurse medicated after session, activity, intermittent rest breaks, distractions provided for pain management, pt reports tolerable to proceed.   ADL: Oral hygiene: Pt trialing electric toothbrush OT recommended with reported and noted increased independence with task, pt also able to screw/unscrew cap from mouthwash container at mod I UB dressing: OT assisted pt with donning BUE wrist splints for increased independence with FMC tasks and ADLs, required overall Mod A  Other Treatments: Pt participated in [facility] activity with magnetic checker pieces on vertical board, pt retrieving pieces  with AAROM for LUE with pt using RUE for stabilizer for LUE, and using RUE with AROM independently with increased time  for increased dexterity and coordination in order to complete ADLs such as buttoning shirts. Pt completed tasks with PRN rest breaks d/t increased fatigue in BUE. VC required for problem solving technique. Pt also completed towel slides with LUE and no weight in order to increase scapular  protraction/retraction for increased ROM and ability to complete ADLs. Pt completed multiple trials with VC for decreased compensatory movements and occasional hand over hand for direction.   Pt seated in W/C at end of session with W/C alarm donned, call light within reach and 4Ps assessed.  Handoff to nsg for medication   Session 2 General: "Hey there Jonathan Luna" Pt seated in W/C upon OT arrival, agreeable to OT. Pt session focus on feeding and modifications.   Pain: no pain reported  ADL: Pt and OT discussing feeding and ways to become more efficient with task d/t requiring some assistance for container management. Pt using teeth in order to open top opening items such as fruit cups or ice cream cups. OT educated pt on using silverware to open soda can just enough so finger can complete task rest of the way when splints donned. Pt educated on AD to assist with opening various cans/containers/bottles from [employer]. Pt demo'd donning BUE splints at Mod I. Pt also completing void of urine in urinal while seated mod I.   Other Treatments: Pt encouraged to listen to body and weakness in order to notice patterns of general weakness or weakness from CIDP. OT using therapeutic listening for pt's frustrations of current situation, pt provided encouragement.   Pt seated in W/C at end of session with W/C alarm donned, call light within reach and 4Ps assessed.    Therapy/Group: Individual Therapy  Nila Barth, OTD, OTR/L 11/23/2023, 4:05 PM

## 2023-11-23 NOTE — Progress Notes (Signed)
 Pt states he is having issues with the lights in his room. Contacted Facilities for assistance. Will follow up today.

## 2023-11-23 NOTE — Progress Notes (Signed)
 Nurse entered room at 0728 to perform initial assessment. PT was on phone. Pt c/o of feet being cold and uncovered lower extremities. Bilateral LE warm to touch, nurse reapplied blankets twice per patients preference. Nurse observed hotpacks on bedside table and asked he was using those, pt stated, " no because of my nerve damage, I think covering them up will be fine." Nurse asked patient what medication he liked to take at bedtime, stated, "klonopin  and benadryl ." Nurse asked if he had any pain, pt stated, "just the usual, can't get anything right now." Nurse stated, "I will finish saying high to everyone then I will start giving medications, would you life anything else?" Pt denied assistance.

## 2023-11-23 NOTE — Progress Notes (Signed)
Patient request to speak with CN,when I arrived to this patient room , I noted his was frustrated and appear anxious.I asked if it was okay if I sat in the chair next to him and he replied I don't care".. I began to communicate with him as he verbalized his and frustrations with being here.He verbalized that he was dissatisfied with being here because the staff don't care, are very immature,disrespectful and he can't seem to get help when he ask for it.I allowed the patient to ventilate his concerns and I apologize for these incidences.This seem to agitate him more.He further implied that I was just trying to cover up for the staff  stating with more extra words.to say to him. I provided reassurance and informed him again that I ws here to listen and provide assistance  as the CN.He stated to get out of the room as the assigned nurse came to provide him with his medication.I assured him that I will be rounding and will follow up with him.

## 2023-11-23 NOTE — Plan of Care (Signed)
  Problem: Consults Goal: RH SPINAL CORD INJURY PATIENT EDUCATION Description:  See Patient Education module for education specifics.  Outcome: Progressing   Problem: RH SAFETY Goal: RH STG ADHERE TO SAFETY PRECAUTIONS W/ASSISTANCE/DEVICE Description: STG Adhere to Safety Precautions With  cues Assistance/Device. Outcome: Progressing   Problem: RH PAIN MANAGEMENT Goal: RH STG PAIN MANAGED AT OR BELOW PT'S PAIN GOAL Description: < 4 with prns Outcome: Progressing   Problem: RH KNOWLEDGE DEFICIT SCI Goal: RH STG INCREASE KNOWLEDGE OF SELF CARE AFTER SCI Description: Patient and spouse will be able to manage care at discharge using educational resources for medications and dietary modification independently.  Outcome: Progressing

## 2023-11-23 NOTE — Progress Notes (Signed)
 Physical Therapy Session Note  Patient Details  Name: Jonathan Luna MRN: [id number] Date of Birth: [birth year]  Today's Date: 11/23/2023 PT Individual Time: 1100-1200 PT Individual Time Calculation (min): 60 min   Short Term Goals: Week 2:  PT Short Term Goal 1 (Week 2): Pt will require CGA with sit<>stand with LRAD PT Short Term Goal 2 (Week 2): Pt will require CGA with gait x 40 ft with LRAD PT Short Term Goal 3 (Week 2): Pt will require min A with steps x 2 with HR's  Skilled Therapeutic Interventions/Progress Updates:      Therapy Documentation Precautions:  Precautions Precautions: Fall Restrictions Weight Bearing Restrictions Per Provider Order: No   Pt agreeable to PT session with emphasis global LE strength training to reduce knee buckling and hyperextension with gait. Pt agreeable to trial [employer] for dorsiflexion assist on 2/11 and PT provided education on intervention. Pt dependent for w/c for time and CGA with stand pivots in session from w/c<>mat. Pt performed [place] Dead Lifts with dowel x 3 + 5 + 2 with tactile and verbal cues for hip hinge. Pt transitioned to prone hamstring curls with emphasis on eccentric control 2 x 6. Pt provided with exercise to perform outside of therapy sessions and PT reviewed combo of hip flexion and LAQ with emphasis on slow movements with holds to increase muscle activation. PT reached out to [facility] for AFO re-consult and plan to message SW as pt expressed increased frequency of neuropsych sessions. Pt left at bedside, all needs in reach.     Therapy/Group: Individual Therapy  Gladystine Lamprey Gladystine Lamprey PT, DPT  11/23/2023, 12:21 PM

## 2023-11-23 NOTE — Progress Notes (Signed)
 Occupational Therapy Session Note  Patient Details  Name: Jonathan Luna MRN: [id number] Date of Birth: 1966-10-12  Today's Date: 11/23/2023 OT Individual Time: 7829-5621 OT Individual Time Calculation (min): 58 min   Short Term Goals: Week 2:  OT Short Term Goal 1 (Week 2): Pt will complete LB dressing at Mod A with AE as necessary OT Short Term Goal 2 (Week 2): Pt will complete donning/doffing wrist stabilizing u-cuff for feeding/grooming as necessary with Min A OT Short Term Goal 3 (Week 2): Pt will complete grooming with Min A with AE as necessary  Skilled Therapeutic Interventions/Progress Updates:   Pt received resting in bed for skilled session with focus on BADL retraining. Pt comes to EOB with supervision + use of bed rails. Sit<>stand from elevated bed with Min A + RW, B-knee buckling noted (no AFOs donned). Stand-pivot from EOB>WC with CGA-light Min A + RW, stand-pivot from WC>toilet (pt declining use of BSC over toilet) with Mod A overall provided for 3/3 toileting activity, able to initiate pericare in sitting. Stand-pivot from WC>TTB with CGA + grab bar. Pt bathes with A for thoroughness of hair washing, buttock, and B feet. Pt comes into semi-squat position, BUE on grab bars for balance as therapist assists with pericare. Pt requires assistance for container management due to B wrist drop. UB dressing with Min A and Max A for LB (including footwear management) due to fatigue/time constraints. Pt remained sitting by sink-side awaiting follow-up OT session, setup for oral care.   Therapy Documentation Precautions:  Precautions Precautions: Fall Restrictions Weight Bearing Restrictions Per Provider Order: No   Therapy/Group: Individual Therapy  Artemus Biles, OTR/L, MSOT  11/23/2023, 6:24 AM

## 2023-11-23 NOTE — Progress Notes (Signed)
 PROGRESS NOTE   Subjective/Complaints:  Pt reports more energy since IVIG finished- esp Saturday and Sunday.  Stressed about not getting back what needs to- said never thought about the fact he might need Assistance at home with wife being blind- and now has very little help.  Knees and ankles feel swollen but he knows they aren't.  Walked 164 ft total with RW yesterday.  Tense 24/7 Frustrated about recovery so far.  No change in wrists Doing estim and get a response in radial nerve.   ROS:   Pt denies SOB, abd pain, CP, N/V/C/D, and vision changes  Objective:   No results found. Recent Labs    11/23/23 0540  WBC 11.9*  HGB 13.5  HCT 40.8  PLT 347     Recent Labs    11/23/23 0540  NA 142  K 3.6  CL 101  CO2 25  GLUCOSE 77  BUN 10  CREATININE 0.81  CALCIUM  9.7     Intake/Output Summary (Last 24 hours) at 11/23/2023 0829 Last data filed at 11/23/2023 0500 Gross per 24 hour  Intake 850 ml  Output 1100 ml  Net -250 ml        Physical Exam: Vital Signs Blood pressure (!) 132/93, pulse 66, temperature 97.6 F (36.4 C), temperature source Oral, resp. rate 18, height 5\' 7"  (1.702 m), weight 82.5 kg, SpO2 98%.        General: awake, alert, appropriate, got up to sitting EOB on own- cannot mash buttons well on remote; NAD HENT: conjugate gaze; oropharynx moist CV: regular rate and rhythm; no JVD Pulmonary: CTA B/L; no W/R/R- good air movement GI: soft, NT, ND, (+)BS Psychiatric: frustrated and somewhat depressed Neurological: Ox3 Extremities:- no LE swelling in knees or ankles B/L  Neuro:  Decreased to light touch from elbows to finger tips B/L. L>R Musculoskeletal:  General: Normal range of motion.     Cervical back: Neck supple. No tenderness.     Comments: RUE- biceps/triceps 4/5; WE 2-/5; Grip 3-/5 and FA 0/5 LUE- biceps and triceps 4/5; WE 0-1/5; Grip 2+/5 and FA 1/5 RLE- HF 4-/5; KE 4/5;  KF 4-/5; DF 2/5 and PF 2+/5 LLE- HF 4-/5; KE 4/5; KF 4-/5 DF 1/5 and PF 2-/5 A little flaccid in UEs.   Assessment/Plan: 1. Functional deficits which require 3+ hours per day of interdisciplinary therapy in a comprehensive inpatient rehab setting. Physiatrist is providing close team supervision and 24 hour management of active medical problems listed below. Physiatrist and rehab team continue to assess barriers to discharge/monitor patient progress toward functional and medical goals  Care Tool:  Bathing    Body parts bathed by patient: Right arm, Left lower leg, Face, Left arm, Chest, Abdomen, Front perineal area, Buttocks, Right upper leg, Left upper leg, Right lower leg         Bathing assist Assist Level: Supervision/Verbal cueing     Upper Body Dressing/Undressing Upper body dressing   What is the patient wearing?: Pull over shirt    Upper body assist Assist Level: Moderate Assistance - Patient 50 - 74%    Lower Body Dressing/Undressing Lower body dressing      What  is the patient wearing?: Underwear/pull up, Pants     Lower body assist Assist for lower body dressing: Maximal Assistance - Patient 25 - 49%     Toileting Toileting    Toileting assist Assist for toileting: Moderate Assistance - Patient 50 - 74%     Transfers Chair/bed transfer  Transfers assist     Chair/bed transfer assist level: Moderate Assistance - Patient 50 - 74% Chair/bed transfer assistive device: Walker, Archivist   Ambulation assist      Assist level: Minimal Assistance - Patient > 75% Assistive device: Walker-Eva Max distance: 50   Walk 10 feet activity   Assist     Assist level: Minimal Assistance - Patient > 75% Assistive device: Walker-Eva   Walk 50 feet activity   Assist    Assist level: Minimal Assistance - Patient > 75% Assistive device: Walker-Eva    Walk 150 feet activity   Assist Walk 150 feet activity did not occur:   (fatigue and imbalance)         Walk 10 feet on uneven surface  activity   Assist Walk 10 feet on uneven surfaces activity did not occur:  (fatigue and imbalance)         Wheelchair     Assist Is the patient using a wheelchair?: Yes Type of Wheelchair: Manual Wheelchair activity did not occur: Safety/medical concerns  Wheelchair assist level: Supervision/Verbal cueing Max wheelchair distance: 150    Wheelchair 50 feet with 2 turns activity    Assist    Wheelchair 50 feet with 2 turns activity did not occur:  (fatigue and imbalance)   Assist Level: Supervision/Verbal cueing   Wheelchair 150 feet activity     Assist  Wheelchair 150 feet activity did not occur:  (fatigue and imbalance)   Assist Level: Supervision/Verbal cueing   Blood pressure (!) 132/93, pulse 66, temperature 97.6 F (36.4 C), temperature source Oral, resp. rate 18, height 5\' 7"  (1.702 m), weight 82.5 kg, SpO2 98%.  Medical Problem List and Plan: 1. 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   -d/c 2/25  Con't CIR PT and OT  No WE stilll so far- con't estim 2.  Antithrombotics: -DVT/anticoagulation:  Pharmaceutical: Lovenox              -antiplatelet therapy: aspirin  81 mg daily   3. Pain Management: Tylenol  as needed             -continue gabapentin  900 mg TID and 900 mg daily PRN             -continue Percocet 5/325 q 4 hours PRN  -Continue Cymbalta  60 mg daily   -Consider trial of Lyrica to replace gabapentin    2/6- will increase Duloxetine  to 90 mg daily and stop Zoloft  so can do so.   2/9 pain improving. Continue plan above 4. Mood/Behavior/Sleep:  LCSW to evaluate and provide emotional support             -  continue Cymbalta  60 mg daily             -continue Zoloft  25 mg daily             -continue clonazepam  0.5 mg BID prn             -antipsychotic agents: n/a  -Patient requested increase of clonazepam  due to anxiety.  He takes this medication chronically PDMP reviewed, will increase temporarily to 1 mg twice daily as needed  -1/31 pt reports anxiety is improved   2/4- pt reports anxiety is better due to Prednisone  same with irritability.   2/10- appears more anxious since recovery not going as expected 5. Neuropsych/cognition: This patient is capable of making decisions on his own behalf.   6. Skin/Wound Care: Routine skin care checks   7. Fluids/Electrolytes/Nutrition: Routine Is and Os and follow-up chemistries   8: Hypertension: monitor TID and prn             -continue Avapro  300 mg daily             -continue Toprol -XL 25 mg daily  -1/31 controlled, continue current      11/23/2023    4:56 AM 11/22/2023    7:36 PM 11/22/2023    2:11 PM  Vitals with BMI  Systolic 132 127 161  Diastolic 93 81 86  Pulse 66 71 90  2/1 Will reduce losartan to 75 mg, start amlodipine  5 mg and monitor- see no. 14 BP controlled will try on amlodipine  alone 2/3 BP controlled continue current regimen   2/4- BP a little elevated DBP- at 90, but otherwise well controlled- con't regimen  2/9 begin norvasc  2.5mg  daily for feet/hands per below  2/10- will try it again, but wasn't working when was on it before 9: Hypothyroidism: continue Synthroid  125 mcg daily   10: Tobacco use: cessation counseling -Nicorette  gum as needed (not using)             -? cough>> on scheduled Mucinex -DM BID   11: CIDP -continue prednisone  40 mg daily -daily VC and NIF (? end date?) -follow-up Dr. Gracie Lav -1/30 respiratory function- NIF >-40 and IS 2500, stable. Continue to monitor -2/3 NIF/IS stable   2/4- NIF's VC OK- on ICS actually- spoke with Dr Gracie Lav- she wants us   to do another dosing of IVIG over a total of 4 days- 2g/kg total dose over 4 days- will arrange with Pharmacy- spoke with them as well- will also increase Prednisone  to 60 mg daily- since I think it was decreased- supposed to be on until f/u with Dr Gracie Lav- needs BMP daily per pharmacy while on IVIG.   2/5- BMP looks great- Sx's about the same, maybe feels  a little weaker"- will recheck strength Thursday- got 1 dose IVIG yesterday- due this am again for next 3 days.   2/6- BMP looks good- strength a little better in grip and triceps, but not in WE- con't regimen  2/7- slightly better, but no major differences- on last day of IVIG- NIF -40- and VC 40  2/8-9 clinically stable from respiratory standpoint 12: Leukocytosis: likely steroid induced, improving; no fever              -follow-up CBC with differential Thursday 1/30 and q Monday  1/3 WBC elevated but stable at 15,  likely steroid related  2/10- WBC 11.9- doing better on steroids    Latest Ref Rng & Units 11/23/2023    5:40 AM 11/16/2023  5:42 AM 11/12/2023    5:52 AM  CBC  WBC 4.0 - 10.5 K/uL 11.9  15.0  15.0   Hemoglobin 13.0 - 17.0 g/dL 16.1  09.6  04.5   Hematocrit 39.0 - 52.0 % 40.8  40.9  41.5   Platelets 150 - 400 K/uL 347  397  412     13.  Elevated ALT  -2/3 improved to 48 today  2/10- will recheck next week    14. Cold feeling in b/l legs.  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  EXTR warm, pulses are normal, do not think he needs arterial Dopplers.  Likely autonomic, trial calcium  channel blocker  2/5- so far, no improvement with Norvasc  5 mg daily-   2/6- pt emphatic that it's the worst part of his pain- he also describes feet feeling wet - but are dry and warmish to touch- will increase Duloxetine  to 90 mg daily and sotp Zoloft .   2/7- pt agreed to change- said nerve pain/cold in feet slightly better already- thinks Norvasc  made him feel fatigued, so wants to stop   2/9 pt willing to try norvasc  again for sx. Feels that it  helped   -will start low dose 2.5mg  daily   2/10- will con't for now- pt told me didn't help initially, but we will see   I spent a total of 38   minutes on total care today- >50% coordination of care- due to d/w pt as well as nursing about overnight- prolonged time with pt- review of all labs, vitals and meds changes.      LOS: 13 days A FACE TO FACE EVALUATION WAS PERFORMED  Jonathan Luna 11/23/2023, 8:29 AM

## 2023-11-24 ENCOUNTER — Ambulatory Visit: Payer: 59 | Admitting: Occupational Therapy

## 2023-11-24 ENCOUNTER — Ambulatory Visit: Payer: 59

## 2023-11-24 NOTE — Progress Notes (Signed)
Occupational Therapy Session Note  Patient Details  Name: Jonathan Luna MRN: [id number] Date of Birth: 1965-12-13  Today's Date: 11/24/2023 OT Individual Time: 1415-1530 OT Individual Time Calculation (min): 75 min    Short Term Goals: Week 2:  OT Short Term Goal 1 (Week 2): Pt will complete LB dressing at Mod A with AE as necessary OT Short Term Goal 2 (Week 2): Pt will complete donning/doffing wrist stabilizing u-cuff for feeding/grooming as necessary with Min A OT Short Term Goal 3 (Week 2): Pt will complete grooming with Min A with AE as necessary  Skilled Therapeutic Interventions/Progress Updates:      Therapy Documentation Precautions:  Precautions Precautions: Fall Restrictions Weight Bearing Restrictions Per Provider Order: No General: "I feel better" Pt seated in W/C upon OT arrival, agreeable to OT.  Pain:  7/10 pain reported in low back and feet, activity, intermittent rest breaks, distractions provided for pain management, pt reports tolerable to proceed.   ADL: : Pt completed ADL in order to complete ADL retraining for increased independence and endurance. Pt completed the following at current levels of assist: Grooming: Min A overall, pt demo increased independence with applying deodorant at supervision, pt requiring assistance with shaving d/t safety UB dressing: SBA with increased time, pt also able to don BUE wrist splints with SBA LB dressing: Mod A overall, Pt required increased assistance with tighter underwear with donning/doffing, increased independence noted when wearing BUE splinting d/t wrist stabilization Footwear: total A for doffing AFOs and donning socks  Shower transfer: CGA ambulating from W/C>TTB with RW and no LOB of knee buckling noted, even with reported fatigue at end of the day Bathing: Min A seated on TTB, using lateral leans for peri area cleansing Transfers: CGA for all sit to stands and functional mobility with RW this date  Pt requiring VC  for reminders to take breaks d/t increased fatigue and to remember energy conservation strategies while completing ADL tasks.   Pt completed bed mobility SBA EOB>supine. Pt supine in bed with bed alarm activated, 2 bed rails up, call light within reach and 4Ps assessed.   Therapy/Group: Individual Therapy  Velia Meyer, OTD, OTR/L 11/24/2023, 4:43 PM

## 2023-11-24 NOTE — Plan of Care (Signed)
Problem: Consults Goal: RH SPINAL CORD INJURY PATIENT EDUCATION Description:  See Patient Education module for education specifics.  Outcome: Progressing   Problem: RH SAFETY Goal: RH STG ADHERE TO SAFETY PRECAUTIONS W/ASSISTANCE/DEVICE Description: STG Adhere to Safety Precautions With  cues Assistance/Device. Outcome: Progressing   Problem: RH PAIN MANAGEMENT Goal: RH STG PAIN MANAGED AT OR BELOW PT'S PAIN GOAL Description: < 4 with prns Outcome: Progressing   Problem: RH KNOWLEDGE DEFICIT SCI Goal: RH STG INCREASE KNOWLEDGE OF SELF CARE AFTER SCI Description: Patient and spouse will be able to manage care at discharge using educational resources for medications and dietary modification independently.  Outcome: Progressing

## 2023-11-24 NOTE — Progress Notes (Signed)
Physical Therapy Session Note  Patient Details  Name: Jonathan Luna MRN: [id number] Date of Birth: 1966-06-14  Today's Date: 11/24/2023 PT Individual Time: 4098-1191 PT Individual Time Calculation (min): 55 min   Short Term Goals: Week 2:  PT Short Term Goal 1 (Week 2): Pt will require CGA with sit<>stand with LRAD PT Short Term Goal 2 (Week 2): Pt will require CGA with gait x 40 ft with LRAD PT Short Term Goal 3 (Week 2): Pt will require min A with steps x 2 with HR's  Skilled Therapeutic Interventions/Progress Updates:     Pt received supine in bed and agrees to therapy. Reports chronic back pain. PT provides rest breaks and repositioning to manage pain. Pt dons shorts with setup assistance and increased time. PT assists to don bilateral AFOs and shoes. Pt performs stand pivot transfer to [facility] with modA and cues for initiation and sequencing, as well as body mechanics and positioning. WC transport to gym. Pt performs alternating LAQs with cues for motor control, eccentric control, and correct performance. Pt performs sit to stand with CGA/minA and cues for initiation. Pt verbalizes that he cannot achieve a sufficient grip on RW with hand splints in place. Pt returns to sitting and removes splints. Pt then stands again with minA and able to achieve improved grip on RW. Pt ambulates 2x175' with minA overall, with tactile cueing at trunk for stability and upright posture. WC transport back to room. Left seated with all needs within reach.   Therapy Documentation Precautions:  Precautions Precautions: Fall Restrictions Weight Bearing Restrictions Per Provider Order: No   Therapy/Group: Individual Therapy  Beau Fanny, PT, DPT 11/24/2023, 4:22 PM

## 2023-11-24 NOTE — Progress Notes (Signed)
PROGRESS NOTE   Subjective/Complaints:  Pt reports still getting response in radial nerve on Estim, but not  causing strength to show up.  Asking if can reach out to Dr Terrace Arabia about him.  Felt so cold in feet yesterday- said even to touch- wrapped him in warm blankets and took 30 minutes to improve.  100% tray LBM 3 days ago-  normally goes every few days- doesn't want intervention today ROS:    Pt denies SOB, abd pain, CP, N/V/C/D, and vision changes  Negative except for HPI and cold feet  Objective:   No results found. Recent Labs    11/23/23 0540  WBC 11.9*  HGB 13.5  HCT 40.8  PLT 347     Recent Labs    11/23/23 0540  NA 142  K 3.6  CL 101  CO2 25  GLUCOSE 77  BUN 10  CREATININE 0.81  CALCIUM 9.7     Intake/Output Summary (Last 24 hours) at 11/24/2023 0841 Last data filed at 11/24/2023 0400 Gross per 24 hour  Intake 120 ml  Output 1500 ml  Net -1380 ml        Physical Exam: Vital Signs Blood pressure (!) 149/99, pulse 73, temperature 98 F (36.7 C), resp. rate 18, height 5\' 7"  (1.702 m), weight 82.5 kg, SpO2 100%.         General: awake, alert, appropriate, sitting up in bed- 100% tray eaten; NAD HENT: conjugate gaze; oropharynx moist CV: regular rate and rhythm; no JVD Pulmonary: CTA B/L; no W/R/R- good air movement GI: soft, NT, ND, (+)BS- normoactive Psychiatric: appropriate but more anxious today Neurological: Ox3  RUE_  Biceps 4+/5; Triceps 4/5; WE 0/5; Grip 2/5; FA 1/5 LUE- biceps 4+/5; Triceps 4-/5 WE 0/5; Grip 2+/5; FA 0/5 RLE- HF 4 to 4+/5 KE 4/5; DF 0/5; PF 3+/5 LLE- HF 4 to 4+/5; KE 4/5; DF 0/5 and PF 3+/5 Extremities:- no LE swelling in knees or ankles B/L  Neuro:  Decreased to light touch from elbows to finger tips B/L. L>R Musculoskeletal:  General: Normal range of motion.     Cervical back: Neck supple. No tenderness.     Comments: RUE- biceps/triceps 4/5; WE 2-/5;  Grip 3-/5 and FA 0/5 LUE- biceps and triceps 4/5; WE 0-1/5; Grip 2+/5 and FA 1/5 RLE- HF 4-/5; KE 4/5; KF 4-/5; DF 2/5 and PF 2+/5 LLE- HF 4-/5; KE 4/5; KF 4-/5 DF 1/5 and PF 2-/5 A little flaccid in UEs.   Assessment/Plan: 1. Functional deficits which require 3+ hours per day of interdisciplinary therapy in a comprehensive inpatient rehab setting. Physiatrist is providing close team supervision and 24 hour management of active medical problems listed below. Physiatrist and rehab team continue to assess barriers to discharge/monitor patient progress toward functional and medical goals  Care Tool:  Bathing    Body parts bathed by patient: Right arm, Left lower leg, Face, Left arm, Chest, Abdomen, Front perineal area, Buttocks, Right upper leg, Left upper leg, Right lower leg         Bathing assist Assist Level: Supervision/Verbal cueing     Upper Body Dressing/Undressing Upper body dressing   What is the patient wearing?:  Pull over shirt    Upper body assist Assist Level: Moderate Assistance - Patient 50 - 74%    Lower Body Dressing/Undressing Lower body dressing      What is the patient wearing?: Underwear/pull up, Pants     Lower body assist Assist for lower body dressing: Maximal Assistance - Patient 25 - 49%     Toileting Toileting    Toileting assist Assist for toileting: Moderate Assistance - Patient 50 - 74%     Transfers Chair/bed transfer  Transfers assist     Chair/bed transfer assist level: Moderate Assistance - Patient 50 - 74% Chair/bed transfer assistive device: Walker, Archivist   Ambulation assist      Assist level: Minimal Assistance - Patient > 75% Assistive device: Walker-Eva Max distance: 50   Walk 10 feet activity   Assist     Assist level: Minimal Assistance - Patient > 75% Assistive device: Walker-Eva   Walk 50 feet activity   Assist    Assist level: Minimal Assistance - Patient >  75% Assistive device: Walker-Eva    Walk 150 feet activity   Assist Walk 150 feet activity did not occur:  (fatigue and imbalance)         Walk 10 feet on uneven surface  activity   Assist Walk 10 feet on uneven surfaces activity did not occur:  (fatigue and imbalance)         Wheelchair     Assist Is the patient using a wheelchair?: Yes Type of Wheelchair: Manual Wheelchair activity did not occur: Safety/medical concerns  Wheelchair assist level: Supervision/Verbal cueing Max wheelchair distance: 150    Wheelchair 50 feet with 2 turns activity    Assist    Wheelchair 50 feet with 2 turns activity did not occur:  (fatigue and imbalance)   Assist Level: Supervision/Verbal cueing   Wheelchair 150 feet activity     Assist  Wheelchair 150 feet activity did not occur:  (fatigue and imbalance)   Assist Level: Supervision/Verbal cueing   Blood pressure (!) 149/99, pulse 73, temperature 98 F (36.7 C), resp. rate 18, height 5\' 7"  (1.702 m), weight 82.5 kg, SpO2 100%.  Medical Problem List and Plan: 1. 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   -d/c 2/25  Con't CIR PT and OT  Will reach out to Dr Terrace Arabia about any other ideas for his CIDP  Team conference today to f/u on progress 2.  Antithrombotics: -DVT/anticoagulation:  Pharmaceutical: Lovenox             -antiplatelet therapy: aspirin 81 mg daily   3. Pain Management: Tylenol as needed             -continue gabapentin 900 mg TID and 900 mg daily PRN             -continue Percocet 5/325 q 4 hours PRN  -Continue Cymbalta 60 mg daily   -Consider trial  of Lyrica to  replace gabapentin   2/6- will increase Duloxetine to 90 mg daily and stop Zoloft so can do so.   2/9 pain improving. Continue plan above 4. Mood/Behavior/Sleep: LCSW to evaluate and provide emotional support             -continue Cymbalta 60 mg daily             -continue Zoloft 25 mg daily             -continue clonazepam 0.5 mg BID prn             -antipsychotic agents: n/a  -Patient requested increase of clonazepam due to anxiety.  He takes this medication chronically PDMP reviewed, will increase temporarily to 1 mg twice daily as needed  -1/31 pt reports anxiety is improved   2/4- pt reports anxiety is better due to Prednisone same with irritability.   2/10- appears more anxious since recovery not going as expected 5. Neuropsych/cognition: This patient is capable of making decisions on his own behalf.   6. Skin/Wound Care: Routine skin care checks   7. Fluids/Electrolytes/Nutrition: Routine Is and Os and follow-up chemistries   8: Hypertension: monitor TID and prn             -continue Avapro 300 mg daily             -continue Toprol-XL 25 mg daily  -1/31 controlled, continue current      11/24/2023    4:34 AM 11/23/2023    7:42 PM 11/23/2023    1:03 PM  Vitals with BMI  Systolic 149 134 130  Diastolic 99 86 91  Pulse 73 75 75  2/1 Will reduce losartan to 75 mg, start amlodipine 5 mg and monitor- see no. 14 BP controlled will try on amlodipine alone 2/3 BP controlled continue current regimen   2/4- BP a little elevated DBP- at 90, but otherwise well controlled- con't regimen  2/9 begin norvasc 2.5mg  daily for feet/hands per below  2/10- will try it again, but wasn't working when was on it before  2/11- no change so far, but will con't- BP slightly elevated this AM- will monitor trend and change as needed in next 2 days 9: Hypothyroidism: continue Synthroid 125 mcg daily   10: Tobacco use: cessation counseling -Nicorette gum as needed (not using)             -? cough>> on  scheduled Mucinex-DM BID   11: CIDP -continue prednisone 40 mg daily -daily VC and NIF (? end date?) -follow-up Dr. Terrace Arabia -1/30 respiratory function- NIF >-40 and IS 2500, stable. Continue to monitor -2/3 NIF/IS stable   2/4- NIF's VC OK- on ICS actually- spoke with Dr Terrace Arabia- she wants [name] to do another dosing of IVIG over a total of 4 days- 2g/kg total dose over 4 days- will arrange with Pharmacy- spoke with them as well- will also increase Prednisone to 60 mg daily- since I think it was decreased- supposed to be on until f/u with Dr Terrace Arabia- needs BMP daily per pharmacy while on IVIG.   2/11- will reach out to to Dr Terrace Arabia since strength not too much better after IVIG-   12: Leukocytosis: likely steroid induced, improving; no fever              -follow-up CBC with differential Thursday 1/30 and q Monday  1/3 WBC elevated but stable at 15,  likely steroid related  2/10- WBC 11.9- doing better on  steroids    Latest Ref Rng & Units 11/23/2023    5:40 AM 11/16/2023    5:42 AM 11/12/2023    5:52 AM  CBC  WBC 4.0 - 10.5 K/uL 11.9  15.0  15.0   Hemoglobin 13.0 - 17.0 g/dL 16.1  09.6  04.5   Hematocrit 39.0 - 52.0 % 40.8  40.9  41.5   Platelets 150 - 400 K/uL 347  397  412     13.  Elevated ALT  -2/3 improved to 48 today  2/10- will recheck next week    14. Cold feeling in b/l legs.  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  EXTR warm, pulses are normal, do not think he needs arterial Dopplers.  Likely autonomic, trial calcium channel blocker  2/5- so far, no improvement with Norvasc 5 mg daily-   2/6- pt emphatic that it's the worst part of his pain- he also describes feet feeling wet - but are dry and warmish to touch- will increase Duloxetine to 90 mg daily and sotp Zoloft.   2/7- pt agreed to change- said nerve pain/cold in feet slightly better already- thinks Norvasc made him feel fatigued, so wants to stop   2/9 pt willing to try norvasc again for sx. Feels that it helped   -will start low dose 2.5mg  daily    2/10- will con't for now- pt told me didn't help initially, but we will see  2/11- pt says yesterday cold was SO bad, it was really painful- will con't Amlodipine.  15. Constipation  2/11- LBM 3 days ago- says feeling OK- wants to wait to push Sorbitol    I spent a total of 52    minutes on total care today- >50% coordination of care- due to  Team conference as well as speaking with nursing and therapy prior to conference and reaching out to Dr Terrace Arabia LOS: 14 days A FACE TO FACE EVALUATION WAS PERFORMED  Maryjean Corpening 11/24/2023, 8:41 AM

## 2023-11-24 NOTE — Progress Notes (Signed)
Physical Therapy Session Note  Patient Details  Name: Jonathan Luna MRN: [id number] Date of Birth: 06-19-1966  Today's Date: 11/24/2023 PT Individual Time: 1300-1400 PT Individual Time Calculation (min): 60 min   Short Term Goals: Week 2:  PT Short Term Goal 1 (Week 2): Pt will require CGA with sit<>stand with LRAD PT Short Term Goal 2 (Week 2): Pt will require CGA with gait x 40 ft with LRAD PT Short Term Goal 3 (Week 2): Pt will require min A with steps x 2 with HR's  Skilled Therapeutic Interventions/Progress Updates: t wheeled to dayroom for energy conservation.  Pt ransferred sit to stand x 2 trials w/ CGA.  Pt amb 175' w/ RW and min/CGA, cueing for posture and heel strike.  Pt then fitted for Amble robotic assist for gait trials.  Pt amb w/ RW and min/CGA x 85' before fatiguing and requesting w/c follow. Pt amb w/ verified heel strike at 98% of steps.  Pt amb to return to dayroom before fatiguing.  Pt returned to room and remained sitting in w/c w/ all needs in reach.     Therapy Documentation Precautions:  Precautions Precautions: Fall Restrictions Weight Bearing Restrictions Per Provider Order: No General:   Vital Signs:   Pain:0/10      Therapy/Group: Individual Therapy  [name] 11/24/2023, 1:56 PM

## 2023-11-24 NOTE — Progress Notes (Signed)
Patient ID: Jonathan Luna, male   DOB: [birth year], 58 y.o.   MRN: [id number]  Met with pt to give him the team conference update regarding progress this week and plan still to target discharge 2/25. He is pleased with his progress and is hopeful he will continue his progress. Discussed home health to being with then transition to OP as he progresses. He is in agreement with this and has no preference regarding agency just one his insurance accepts. He is attempting to call his MD to get his questions answered regarding maintenance for his GBS

## 2023-11-25 ENCOUNTER — Encounter (HOSPITAL_COMMUNITY): Payer: 59

## 2023-11-25 ENCOUNTER — Encounter: Payer: 59 | Admitting: Neurology

## 2023-11-25 MED ORDER — DULOXETINE HCL 60 MG PO CPEP
120.0000 mg | ORAL_CAPSULE | Freq: Every day | ORAL | Status: DC
  Administered 2023-11-26 – 2023-12-08 (×13): 120 mg via ORAL
  Filled 2023-11-25 (×13): qty 2

## 2023-11-25 NOTE — Patient Care Conference (Signed)
Inpatient RehabilitationTeam Conference and Plan of Care Update Date: 11/24/2023   Time: 1130 am    Patient Name: Jonathan Luna      Medical Record Number: [id number]  Date of Birth: 04-07-1966 Sex: Male         Room/Bed: 4W10C/4W10C-01 Payor Info: Payor: Advertising copywriter / Plan: Advertising copywriter OTHER / Product Type: *No Product type* /    Admit Date/Time:  11/10/2023  1:45 PM  Primary Diagnosis:  CIDP (chronic inflammatory demyelinating polyneuropathy) [facility])  Hospital Problems: Principal Problem:   CIDP (chronic inflammatory demyelinating polyneuropathy) [facility])    Expected Discharge Date: Expected Discharge Date: 12/08/23  Team Members Present: Physician leading conference: Dr. Genice Rouge Social Worker Present: Dossie Der, LCSW Nurse Present: Chana Bode, Janina Mayo, RN PT Present: Arlie Solomons, PT OT Present: Velia Meyer, OT SLP Present: Jeannie Done, SLP PPS Coordinator present : Fae Pippin, SLP     Current Status/Progress Goal Weekly Team Focus  Bowel/Bladder   Continent of bowel and bladder. Last Bm 11/20/22   Remain continent of bowel and bladder.   Assess bowel and bladder q 2 hours while awake and q4 while asleep.    Swallow/Nutrition/ Hydration               ADL's   toilet transfer- CGA-light Min A + RW, toileting-Mod A, shower transfer- Stand-pivot from WC>TTB with CGA + grab bar, bathing- Min A, UB dressing- Min A LB dressing- Mod A-Max A, footwear-total A for B AFO, splinting- mod I donning/doffing   mod I   BUE FMC, LB ADL retraining, transfer training, generalized strength/endurance    Mobility   SBA bed mobility, CGA sit<>stand, CGA gait ~165 ft with RW   Mod I  decreasing compensatory gait strategies to increase safety    Communication                Safety/Cognition/ Behavioral Observations               Pain   Complains of chronic back and nerve pain.   pt rates pain level less than 3 out of 10.   Assess pain q shift  and PRN, give medication as per order.    Skin   Skin intact.   Remain free from S/S of skin breakdown.  Assess skin q shift and PRN.      Discharge Planning:  pt is mkaing progress this week in therapies. Needs to be mod/i to go home since wife is blind and works. Will see if can go back to OP or will need to being with [facility] SSD referral made and seeing neuro-psych   Team Discussion: Patient admitted post CIDP exacerbation; Received IVIG over 4 days; medications adjusted. Patient has leukocytosis likely medication related. Patient limited by constipation, pain; medications adjusted. Patient on target to meet rehab goals: yes,  Patient requires min assist with upper body care and patient requires mod A with lower body care. Patient requires contact guard assist with ambulation up to 165' using a rolling walker.  *See Care Plan and progress notes for long and short-term goals.   Revisions to Treatment Plan:  Splints/AFO's  Teaching Needs: Medications, transfers, toileting, safety, etc  Current Barriers to Discharge: Decreased caregiver support  Possible Resolutions to Barriers: Family Education Outpatient follow up [facility] referral pending Outpatient Neurology consult      Medical Summary Current Status: COnitnent B/B- LBM either yesterday or 3 days ago?- pain mainly nerve pain-  has Wrist splints-for radial nerve  palsy  Barriers to Discharge: Behavior/Mood;Uncontrolled Hypertension;Self-care education;Medical stability  Barriers to Discharge Comments: CIDP_ weakness esp radial nerve palsy B/L - is making progress, but limited by anxietyl IVIG has made some gains; severe nerve pain and cold feet Possible Resolutions to [employer]: doing better with dynamics wrist splints- he won't sleep with WHO, only dynamic wrist splints- is CGA-mi A right now- - d/c 2/25   Continued Need for Acute Rehabilitation Level of Care: The patient requires daily medical management by a  physician with specialized training in physical medicine and rehabilitation for the following reasons: Direction of a multidisciplinary physical rehabilitation program to maximize functional independence : Yes Medical management of patient stability for increased activity during participation in an intensive rehabilitation regime.: Yes Analysis of laboratory values and/or radiology reports with any subsequent need for medication adjustment and/or medical intervention. : Yes   I attest that I was present, lead the team conference, and concur with the assessment and plan of the team.   Konrad Dolores [facility] 11/25/2023, 10:32 AM

## 2023-11-25 NOTE — Progress Notes (Signed)
PROGRESS NOTE   Subjective/Complaints:  LBM 2 days ago- actually went on 2/10  Feels like foot coldness is "brutal"  Warm blankets- were used due to cold feet so bad.   Wants me to "do something" to fix it.  ROS:    Pt denies SOB, abd pain, CP, N/V/C/D, and vision changes  Negative except for HPI and cold feet  Objective:   No results found. Recent Labs    11/23/23 0540  WBC 11.9*  HGB 13.5  HCT 40.8  PLT 347     Recent Labs    11/23/23 0540  NA 142  K 3.6  CL 101  CO2 25  GLUCOSE 77  BUN 10  CREATININE 0.81  CALCIUM 9.7     Intake/Output Summary (Last 24 hours) at 11/25/2023 0831 Last data filed at 11/25/2023 0500 Gross per 24 hour  Intake 1296 ml  Output 2750 ml  Net -1454 ml        Physical Exam: Vital Signs Blood pressure 126/89, pulse 78, temperature 97.8 F (36.6 C), temperature source Oral, resp. rate 17, height 5\' 7"  (1.702 m), weight 82.5 kg, SpO2 98%.          General: awake, alert, appropriate, sitting up EOB- hasn't eaten breakfast yet; NAD HENT: conjugate gaze; oropharynx moist CV: regular rate and rhythm; no JVD Pulmonary: CTA B/L; no W/R/R- good air movement GI: soft, NT, ND, (+)BS Psychiatric: appropriate- slightly anxious Neurological: Ox3  Feet don't feel real cold- slightly cool- on exam  RUE_  Biceps 4+/5; Triceps 4/5; WE 0/5; Grip 2/5; FA 1/5 LUE- biceps 4+/5; Triceps 4-/5 WE 0/5; Grip 2+/5; FA 0/5 RLE- HF 4 to 4+/5 KE 4/5; DF 0/5; PF 3+/5 LLE- HF 4 to 4+/5; KE 4/5; DF 0/5 and PF 3+/5 Extremities:- no LE swelling in knees or ankles B/L  Neuro:  Decreased to light touch from elbows to finger tips B/L. L>R Musculoskeletal:  General: Normal range of motion.     Cervical back: Neck supple. No tenderness.     Comments: RUE- biceps/triceps 4/5; WE 2-/5; Grip 3-/5 and FA 0/5 LUE- biceps and triceps 4/5; WE 0-1/5; Grip 2+/5 and FA 1/5 RLE- HF 4-/5; KE 4/5; KF  4-/5; DF 2/5 and PF 2+/5 LLE- HF 4-/5; KE 4/5; KF 4-/5 DF 1/5 and PF 2-/5 A little flaccid in UEs.   Assessment/Plan: 1. Functional deficits which require 3+ hours per day of interdisciplinary therapy in a comprehensive inpatient rehab setting. Physiatrist is providing close team supervision and 24 hour management of active medical problems listed below. Physiatrist and rehab team continue to assess barriers to discharge/monitor patient progress toward functional and medical goals  Care Tool:  Bathing    Body parts bathed by patient: Right arm, Left lower leg, Face, Left arm, Chest, Abdomen, Front perineal area, Buttocks, Right upper leg, Left upper leg, Right lower leg         Bathing assist Assist Level: Supervision/Verbal cueing     Upper Body Dressing/Undressing Upper body dressing   What is the patient wearing?: Pull over shirt    Upper body assist Assist Level: Moderate Assistance - Patient 50 - 74%  Lower Body Dressing/Undressing Lower body dressing      What is the patient wearing?: Underwear/pull up, Pants     Lower body assist Assist for lower body dressing: Maximal Assistance - Patient 25 - 49%     Toileting Toileting    Toileting assist Assist for toileting: Moderate Assistance - Patient 50 - 74%     Transfers Chair/bed transfer  Transfers assist     Chair/bed transfer assist level: Moderate Assistance - Patient 50 - 74% Chair/bed transfer assistive device: Walker, Archivist   Ambulation assist      Assist level: Minimal Assistance - Patient > 75% Assistive device: Walker-rolling Max distance: 175   Walk 10 feet activity   Assist     Assist level: Minimal Assistance - Patient > 75% Assistive device: Walker-rolling   Walk 50 feet activity   Assist    Assist level: Minimal Assistance - Patient > 75% Assistive device: Walker-rolling    Walk 150 feet activity   Assist Walk 150 feet activity did not  occur:  (fatigue and imbalance)  Assist level: Minimal Assistance - Patient > 75% Assistive device: Walker-rolling    Walk 10 feet on uneven surface  activity   Assist Walk 10 feet on uneven surfaces activity did not occur:  (fatigue and imbalance)         Wheelchair     Assist Is the patient using a wheelchair?: Yes Type of Wheelchair: Manual Wheelchair activity did not occur: Safety/medical concerns  Wheelchair assist level: Supervision/Verbal cueing Max wheelchair distance: 150    Wheelchair 50 feet with 2 turns activity    Assist    Wheelchair 50 feet with 2 turns activity did not occur:  (fatigue and imbalance)   Assist Level: Supervision/Verbal cueing   Wheelchair 150 feet activity     Assist  Wheelchair 150 feet activity did not occur:  (fatigue and imbalance)   Assist Level: Supervision/Verbal cueing   Blood pressure 126/89, pulse 78, temperature 97.8 F (36.6 C), temperature source Oral, resp. rate 17, height 5\' 7"  (1.702 m), weight 82.5 kg, SpO2 98%.  Medical Problem List and Plan: 1. 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   -d/c 2/25  Con't CIR PT and OT 2.  Antithrombotics: -DVT/anticoagulation:  Pharmaceutical: Lovenox             -antiplatelet therapy: aspirin 81 mg daily   3. Pain Management: Tylenol as needed             -continue gabapentin 900 mg TID and 900 mg daily PRN             -continue Percocet 5/325 q 4 hours PRN  -Continue Cymbalta 60 mg daily   -Consider trial of Lyrica to replace gabapentin   2/6- will increase Duloxetine to 90 mg daily and stop Zoloft so can do so.   2/9 pain  improving. Continue plan above  2/12- pt  reports still a major issue- but will increase Duloxetine to 120 mg daily and monitor 4. Mood/Behavior/Sleep: LCSW to evaluate and provide emotional support             -continue Cymbalta 60 mg daily             -continue Zoloft 25 mg daily             -continue clonazepam 0.5 mg BID prn             -antipsychotic agents: n/a  -Patient requested increase of clonazepam due to anxiety.  He takes this medication chronically PDMP reviewed, will increase temporarily to 1 mg twice daily as needed  -1/31 pt reports anxiety is improved   2/4- pt reports anxiety is better due to Prednisone same with irritability.   2/10- appears more anxious since recovery not going as expected 5. Neuropsych/cognition: This patient is capable of making decisions on his own behalf.   6. Skin/Wound Care: Routine skin care checks   7. Fluids/Electrolytes/Nutrition: Routine Is and Os and follow-up chemistries   8: Hypertension: monitor TID and prn             -continue Avapro 300 mg daily             -continue Toprol-XL 25 mg daily  -1/31 controlled, continue current      11/25/2023    5:12 AM 11/24/2023    7:32 PM 11/24/2023    4:11 PM  Vitals with BMI  Systolic 126 133 161  Diastolic 89 90 80  Pulse 78 65 77  2/1 Will reduce losartan to 75 mg, start amlodipine 5 mg and monitor- see no. 14 BP controlled will try on amlodipine alone 2/3 BP controlled continue current regimen   2/4- BP a little elevated DBP- at 90, but otherwise well controlled- con't regimen  2/9 begin norvasc 2.5mg  daily for feet/hands per below  2/10- will try it again, but wasn't working when was on it before  2/11- no change so far, but will   2/12- BP usually controlled, however will wait on increase of Norvasc for foot coldness 9: Hypothyroidism: continue Synthroid 125 mcg daily   10: Tobacco use: cessation counseling -Nicorette gum as needed (not using)             -? cough>> on scheduled  Mucinex-DM BID   11: CIDP -continue prednisone 40 mg daily -daily VC and NIF (? end date?) -follow-up Dr. Terrace Arabia -1/30 respiratory function- NIF >-40 and IS 2500, stable. Continue to monitor -2/3 NIF/IS stable   2/4- NIF's VC OK- on ICS actually- spoke with Dr Terrace Arabia- she wants [name] to do another dosing of IVIG over a total of 4 days- 2g/kg total dose over 4 days- will arrange with Pharmacy- spoke with them as well- will also increase Prednisone to 60 mg daily- since I think it was decreased- supposed to be on until f/u with Dr Terrace Arabia- needs BMP daily per pharmacy while on IVIG.   2/11- will reach out to to Dr Terrace Arabia since strength not too much better after IVIG-    2/12- haven't heard back from Dr Terrace Arabia- will wait to hear something- explained to pt that I don't know if there's another tx- at least form my experience other than what's been tried.  12: Leukocytosis: likely steroid induced, improving; no fever              -follow-up CBC with differential Thursday 1/30 and q Monday  1/3 WBC elevated but stable at 15,  likely steroid related  2/10- WBC 11.9- doing better on steroids    Latest Ref Rng & Units 11/23/2023    5:40 AM 11/16/2023    5:42 AM 11/12/2023    5:52 AM  CBC  WBC 4.0 - 10.5 K/uL 11.9  15.0  15.0   Hemoglobin 13.0 - 17.0 g/dL 16.1  09.6  04.5   Hematocrit 39.0 - 52.0 % 40.8  40.9  41.5   Platelets 150 - 400 K/uL 347  397  412     13.  Elevated ALT  -2/3 improved to 48 today  2/10- will recheck next week    14. Cold feeling in b/l legs.  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  EXTR warm, pulses are normal, do not think he needs arterial Dopplers.  Likely autonomic, trial calcium channel blocker  2/5- so far, no improvement with Norvasc 5 mg daily-   2/6- pt emphatic that it's the worst part of his pain- he also describes feet feeling wet - but are dry and warmish to touch- will increase Duloxetine to 90 mg daily and sotp Zoloft.   2/7- pt agreed to change- said nerve pain/cold in feet slightly  better already- thinks Norvasc made him feel fatigued, so wants to stop   2/9 pt willing to try norvasc again for sx. Feels that it helped   -will start low dose 2.5mg  daily   2/10- will con't for now- pt told me didn't help initially, but we will see  2/11- pt says yesterday cold was SO bad, it was really painful- will con't Amlodipine. 2/12- Will increase Duloxetine to 120 mg daily- is really bothering him. Wait to increase Norvasc today  15. Constipation  2/11- LBM 3 days ago- says feeling OK- wants to wait to push Sorbitol  2/12- LBM now showing as 2/10- will monitor  I spent a total of  41   minutes on total care today- >50% coordination of care- due to d/w pt as well as nursing about cold feet- made meds changes and waiting on Norvasc increase  LOS: 15 days A FACE TO FACE EVALUATION WAS PERFORMED  Mingo Siegert 11/25/2023, 8:31 AM

## 2023-11-25 NOTE — Progress Notes (Signed)
Physical Therapy Session Note  Patient Details  Name: Jonathan Luna MRN: [id number] Date of Birth: Nov 21, 1965  Today's Date: 11/25/2023 PT Individual Time: 4098-1191 PT Individual Time Calculation (min): 46 min   Short Term Goals: Week 2:  PT Short Term Goal 1 (Week 2): Pt will require CGA with sit<>stand with LRAD PT Short Term Goal 2 (Week 2): Pt will require CGA with gait x 40 ft with LRAD PT Short Term Goal 3 (Week 2): Pt will require min A with steps x 2 with HR's  Skilled Therapeutic Interventions/Progress Updates: Pt presented in bed agreeable to therapy. Pt c/o mild urated back pain and that feet are cold, no intervention required at this time. Pt completed supine to sit with supervision and use of bed features. PTA donned shoes/AFO total A for time management. Completed Sit to stand from elevated bed with minA and completed ambulatory transfer to w/c with CGA. Pt then completed LAQ with increased eccentric control x 10 bilaterally as "warm up". Pt then transported to day room and completed ambulatory transfer to mat. Pt completed Sit to stand from elevated mat x 5 with hands on thighs for increased closed chain ms recruitment. Pt requiring modA and facilitation for anterior weight shfiting to complete task. Pt also performed Sit to stand with 2in block on unilateral side for increased recruitment R vs L. After seated rest break pt ambulated ~173 with chair follow with CGA overall. Pt noted to continue to have heavy reliance on RW as well as forward flexed posture, shortened step length, and decreased speed. Once completed pt with seated rest break and ambulated and additional 173ft back to room with same compensatory movements as prior. Pt returned to w/c at end of session and left with call bell within reach and needs met.      Therapy Documentation Precautions:  Precautions Precautions: Fall Restrictions Weight Bearing Restrictions Per Provider Order: No General:   Vital  Signs: Therapy Vitals Temp: 98.2 F (36.8 C) Pulse Rate: 86 Resp: 18 BP: 134/79 Patient Position (if appropriate): Sitting Oxygen Therapy SpO2: 98 % O2 Device: Room Air     Therapy/Group: Individual Therapy  Harbert Fitterer 11/25/2023, 4:23 PM

## 2023-11-25 NOTE — Telephone Encounter (Signed)
Call to patient, he states that he is rehab and doing better with IVIG and would like to continue. He still can't bend wrist but he is walking better with walker. He is getting out of rehab on the 25th.  He reports being weaker when he had the blood plasma and never getting strength back with plasma exchange. Advised I would send to Dr. Terrace Arabia to see what her treatment plan moving forward is. Patient appreciative of call.

## 2023-11-25 NOTE — Progress Notes (Signed)
Occupational Therapy Weekly Progress Note  Patient Details  Name: Jonathan Luna MRN: [id number] Date of Birth: Oct 21, 1965  Beginning of progress report period: November 18, 2023 End of progress report period: November 25, 2023  Today's Date: 11/25/2023 OT Individual Time: 4098-1191 & 1304-1400 OT Individual Time Calculation (min): 60 min & 60 min   Patient has met 3 of 3 short term goals.  Pt with increased independence with ADLs since round of IVIg with increased FMC with BUE dynamic wrist extension splints. Pt continues to require most assistance with LB dressing tasks, dynamic balance and ataxia management.  Patient continues to demonstrate the following deficits: muscle weakness and muscle joint tightness, decreased cardiorespiratoy endurance, impaired timing and sequencing, abnormal tone, unbalanced muscle activation, ataxia, and decreased coordination, and decreased standing balance, decreased postural control, and decreased balance strategies and therefore will continue to benefit from skilled OT intervention to enhance overall performance with BADL and Reduce care partner burden.  Patient progressing toward long term goals..  Continue plan of care.  OT Short Term Goals Week 1:  OT Short Term Goal 1 (Week 1): Pt will complete grooming with SBA with AE as necessary OT Short Term Goal 1 - Progress (Week 1): Revised due to lack of progress OT Short Term Goal 2 (Week 1): Pt will complete LB dressing at Min A with AS as necessary OT Short Term Goal 2 - Progress (Week 1): Revised due to lack of progress OT Short Term Goal 3 (Week 1): Pt will complete donning/doffing wrist stabilizing u-cuff for feeding/grooming as necessary with SBA OT Short Term Goal 3 - Progress (Week 1): Revised due to lack of progress Week 2:  OT Short Term Goal 1 (Week 2): Pt will complete LB dressing at Mod A with AE as necessary OT Short Term Goal 1 - Progress (Week 2): Met OT Short Term Goal 2 (Week 2): Pt will  complete donning/doffing wrist stabilizing u-cuff for feeding/grooming as necessary with Min A OT Short Term Goal 2 - Progress (Week 2): Met OT Short Term Goal 3 (Week 2): Pt will complete grooming with Min A with AE as necessary OT Short Term Goal 3 - Progress (Week 2): Met Week 3:  OT Short Term Goal 1 (Week 3): Pt will complete LB dressing at Min A with AE as necessary OT Short Term Goal 2 (Week 3): Pt will complete toilet transfer at SBA with AD OT Short Term Goal 3 (Week 3): Pt will complete dynamic standing activities with Min A  Skilled Therapeutic Interventions/Progress Updates:      Therapy Documentation Precautions:  Precautions Precautions: Fall Restrictions Weight Bearing Restrictions Per Provider Order: No Session 1 General: "I feel good today" Pt seated in W/C upon OT arrival, agreeable to OT.  Pain:  5/10 pain reported in low back, activity, intermittent rest breaks, distractions provided for pain management, pt reports tolerable to proceed.   ADL: Pt ambulated to toilet CGA with RW, able to complete toileting at Mod A with assistance for managing pants, able to complete hygiene after BM, able to almost pull pants up but required assistance for thoroughness. D/t wrist drop.  Exercises: Pt participated in [facility] activity with 1 inch blocks, pt retrieving blocks  with BUE wearing BUE splints and placing them into stack  for increased dexterity, ataxia management, motor planning and coordination in order to complete ADLs such as buttoning shirts. Pt completed tasks with PRN rest breaks d/t increased fatigue in BUE.   Pt also completing strength/ROM  and dynamic sitting exercise of throwing large medicine ball. Pt with VC to raise ball as high as he can with BUE. Pt required increased time for stability for managing ball above head d/t ataxia. Pt completed multiple trials of activity at various distances and reported to really enjoy activity.   Pt seated in W/C at end of session  with W/C alarm donned, call light within reach and 4Ps assessed.    Session 2 General: "This was fun!" Pt seated in W/C upon OT arrival, agreeable to OT.  Pain: 5/10 pain reported in low back, activity, intermittent rest breaks, distractions provided for pain management, pt reports tolerable to proceed.   Other Treatments: Pt completing playing Wii in order to increase FMC from manipulating controller, BUE strength/endurance, dynamic sitting and standing. Pt initially completing activities with splinting on but wanted to remove d/t increased grip on the controller. Pt able to grip controller with RUE. Pt completing some games in standing with no LOB, although pt reporting not feeling stable. Pt requiring PRN rest breaks for recovery.   Pt seated in W/C at end of session with W/C alarm donned, call light within reach and 4Ps assessed.    Therapy/Group: Individual Therapy  Velia Meyer, OTD, OTR/L 11/25/2023, 4:15 PM

## 2023-11-25 NOTE — Telephone Encounter (Signed)
Pt is asking for a call from RN to discuss his stint in rehab (expected release date being 02-25) also wanting to discuss when he will start his IVIG

## 2023-11-25 NOTE — Progress Notes (Signed)
Physical Therapy Session Note  Patient Details  Name: Jonathan Luna MRN: [id number] Date of Birth: 15-Jun-1966  Today's Date: 11/25/2023 PT Individual Time: 1136-1200 PT Individual Time Calculation (min): 24 min   Short Term Goals: Week 2:  PT Short Term Goal 1 (Week 2): Pt will require CGA with sit<>stand with LRAD PT Short Term Goal 2 (Week 2): Pt will require CGA with gait x 40 ft with LRAD PT Short Term Goal 3 (Week 2): Pt will require min A with steps x 2 with HR's  Skilled Therapeutic Interventions/Progress Updates: Patient sitting in WC on entrance to room. Patient alert and agreeable to PT session.   Patient with no complaints of pain throughout. Beginning of session focused on re-building pt rapport from previous inpatient admission to now. Pt reported that therapy has been going well, and that he continues to try his best to push forward despite drastic changes in these last few months. Pt with request to ambulate when asked what pt's personal goals were for this PT session.  Therapeutic Exercise: Pt performed the following exercises with therapist providing the described cuing and facilitation for improvement. - Pt ambulated from room<>day room gym with RW and with CGA throughout with [facility] for safety in order to increase ambulatory endurance. Pt required extended seated rest break prior to returning back to room due to fatigue. 155' x 2. Pt required standing rest break shortly before entering room due to fatigue with slight increase in B LE/UE trembling (decreased after standing rest).  Patient sitting in WC at end of session with brakes locked, and all needs within reach.      Therapy Documentation Precautions:  Precautions Precautions: Fall Restrictions Weight Bearing Restrictions Per Provider Order: No   Therapy/Group: Individual Therapy  Vidal Lampkins PTA 11/25/2023, 12:07 PM

## 2023-11-25 NOTE — Plan of Care (Signed)
  Problem: RH KNOWLEDGE DEFICIT SCI Goal: RH STG INCREASE KNOWLEDGE OF SELF CARE AFTER SCI Description: Patient and spouse will be able to manage care at discharge using educational resources for medications and dietary modification independently.  Outcome: Progressing   Problem: RH PAIN MANAGEMENT Goal: RH STG PAIN MANAGED AT OR BELOW PT'S PAIN GOAL Description: < 4 with prns Outcome: Progressing   Problem: Consults Goal: RH SPINAL CORD INJURY PATIENT EDUCATION Description:  See Patient Education module for education specifics.  Outcome: Progressing

## 2023-11-26 ENCOUNTER — Ambulatory Visit: Payer: 59

## 2023-11-26 ENCOUNTER — Ambulatory Visit: Payer: 59 | Admitting: Occupational Therapy

## 2023-11-26 LAB — HEPATITIS PANEL, ACUTE
HCV Ab: NONREACTIVE
Hep A IgM: NONREACTIVE
Hep B C IgM: NONREACTIVE
Hepatitis B Surface Ag: NONREACTIVE

## 2023-11-26 MED ORDER — IMMUNE GLOBULIN (HUMAN) 10 GM/100ML IV SOLN
500.0000 mg/kg | INTRAVENOUS | Status: AC
  Administered 2023-11-26 – 2023-11-27 (×2): 35 g via INTRAVENOUS
  Filled 2023-11-26 (×6): qty 350

## 2023-11-26 NOTE — Progress Notes (Signed)
Physical Therapy Weekly Progress Note  Patient Details  Name: Jonathan Luna MRN: [id number] Date of Birth: Aug 03, 1966  Beginning of progress report period: November 18, 2023 End of progress report period: November 26, 2023  Today's Date: 11/26/2023 PT Individual Time: 4098-1191 PT Individual Time Calculation (min): 72 min   Patient has met 2 of 3 short term goals.  Pt has not recently attempted stairs d/t fatigue and imbalance but has made significant progress in activity tolerance over the past week. Pt now able to ambulate 175' with CGA/minA with RW or several bouts of shorter distances with seated rest breaks. Pt has improved standing tolerance and balance and is able to perform dynamic standing activities such as Wii games with multiple seated rest breaks. Pt will continue to benefit from activities improving endurance, ambulation, balance and balance reactions, and education on energy conservation.   Patient continues to demonstrate the following deficits muscle weakness, abnormal tone and ataxia, and decreased standing balance and decreased balance strategies and therefore will continue to benefit from skilled PT intervention to increase functional independence with mobility.  Patient progressing toward long term goals..  Continue plan of care.  PT Short Term Goals Week 2:  PT Short Term Goal 1 (Week 2): Pt will require CGA with sit<>stand with LRAD PT Short Term Goal 1 - Progress (Week 2): Met (from w/c or seat with arm rests) PT Short Term Goal 2 (Week 2): Pt will require CGA with gait x 40 ft with LRAD PT Short Term Goal 2 - Progress (Week 2): Met PT Short Term Goal 3 (Week 2): Pt will require min A with steps x 2 with HR's PT Short Term Goal 3 - Progress (Week 2): Progressing toward goal (not tested recently) Week 3:  PT Short Term Goal 1 (Week 3): Pt will require minA with steps x2 with HR PT Short Term Goal 2 (Week 3): Pt will ambulate 150 feet with LRAD and SBA PT Short Term Goal 3  (Week 3): Pt will maintain dynamic standing balance with SBA and LRAD for duration of therapeutic activity (Wii, connect 4 game, etc). PT Short Term Goal 4 (Week 3): Pt will perform sit<>stand with supervision and LRAD  Skilled Therapeutic Interventions/Progress Updates:     Pt received seated in w/c and agreeable to therapy session. Pt does not have c/o pain. Notes his shower earlier this morning was tiring but pt states his "gas tank" is at 100%.   Pt dependently transported to lobby for energy conservation. Pt performed x4 bouts of 100' of gait training with verbal cues for decreased hyperextension thrust bilaterally and CGA. Pt also given verbal cues to plan ahead seated rest breaks. Pt had increased difficulty with sit<>stand from chair without arm rests and discussed strategies for locating suitable rest areas and how to pull chairs out from tables. Pt propped one arm on table and used other UE to pull out chair with CGA and verbal cues to avoid tangling legs of RW and chair. After last walking bout, upon standing to transfer back to w/c, pt had one self corrected LOB and displayed good stepping strategy.   Pt dependently transported back to dayroom for standing balance activities on Wii. Pt stood with multiple rest breaks d/t fatigue and imbalance for 5 frames of Wii bowling with RW and CGA and verbal cues for RW proximity, demonstrated decreased endurance but good self-assessment of energy levels during cognitive dual task with dynamic standing balance requirements. Pt dependently transported back to room  for energy conservation and left seated in w/c with all needs in reach.   Therapy Documentation Precautions:  Precautions Precautions: Fall Restrictions Weight Bearing Restrictions Per Provider Order: No General:      Therapy/Group: Individual Therapy  Collins [place] 11/26/2023, 3:43 PM

## 2023-11-26 NOTE — Telephone Encounter (Signed)
I am communicating with his rehab physician Dr. Berline Chough,  will likely have another round of IVIG 2g/kg  If he shows improvement with IVIG  We will continue outpatient IVIG after he is discharged from rehab,   May consider other treatment options as well such as RItuximab, depend on his response

## 2023-11-26 NOTE — Progress Notes (Signed)
Nurse and tech observed on! Nitcotine pouch container on bedside table. Patient stated that he had approval on third floor by provider that he can have this. Nurse checked orders and looked through notes, but could not find approval. Notes do say he was use nicorette gum PRN for tobacco cessation. Notifed Press photographer.

## 2023-11-26 NOTE — Progress Notes (Signed)
PROGRESS NOTE   Subjective/Complaints:  Pt reports leaving feet down for 15+ minutes causes feet to get cold/like ice/walking barefoot in snow cold.   Using warm blankets to warm back up.  But not quite as cold this AM.   Has bruise in R upper arm- got lovenox in arm, not abd- explained needs to get in abdomen.  Spoke to pt about hearing from Dr Terrace Arabia, but not clear what we do next- had IVIG from 2/4-2/7- per Dr Zannie Cove note, cannot do until has been 1 week, so will look at this.   I reached out to Neuro- they said they would see him today.    ROS:    Pt denies SOB, abd pain, CP, N/V/C/D, and vision changes  Cold feet (+) Negative except for HPI and cold feet  Objective:   No results found. No results for input(s): "WBC", "HGB", "HCT", "PLT" in the last 72 hours.    No results for input(s): "NA", "K", "CL", "CO2", "GLUCOSE", "BUN", "CREATININE", "CALCIUM" in the last 72 hours.    Intake/Output Summary (Last 24 hours) at 11/26/2023 0833 Last data filed at 11/26/2023 0700 Gross per 24 hour  Intake 600 ml  Output 1525 ml  Net -925 ml        Physical Exam: Vital Signs Blood pressure 134/84, pulse 79, temperature 97.7 F (36.5 C), temperature source Oral, resp. rate 17, height 5\' 7"  (1.702 m), weight 82.5 kg, SpO2 99%.          General: awake, alert, appropriate,  sitting up in bed - legs under blanket; NAD HENT: conjugate gaze; oropharynx moist CV: regular rate and rhythm; no JVD Pulmonary: CTA B/L; no W/R/R- good air movement GI: soft, NT, ND, (+)BS- normoactive Psychiatric: appropriate Neurological: Ox3 Extremities:- cool/a little cold, not freezing on exam   Biceps and triceps 4+/5; WE 0/5; Grip 2/5 and on R FA 0/5 and on L 1/5 - rechecked again today RLE- HF 4 to 4+/5 KE 4/5; DF 0/5; PF 3+/5 LLE- HF 4 to 4+/5; KE 4/5; DF 0/5 and PF 3+/5 Extremities:- no LE swelling in knees or ankles B/L  Neuro:   Decreased to light touch from elbows to finger tips B/L. L>R Musculoskeletal:  General: Normal range of motion.     Cervical back: Neck supple. No tenderness.     Comments: RUE- biceps/triceps 4/5; WE 2-/5; Grip 3-/5 and FA 0/5 LUE- biceps and triceps 4/5; WE 0-1/5; Grip 2+/5 and FA 1/5 RLE- HF 4-/5; KE 4/5; KF 4-/5; DF 2/5 and PF 2+/5 LLE- HF 4-/5; KE 4/5; KF 4-/5 DF 1/5 and PF 2-/5 A little flaccid in UEs.   Assessment/Plan: 1. Functional deficits which require 3+ hours per day of interdisciplinary therapy in a comprehensive inpatient rehab setting. Physiatrist is providing close team supervision and 24 hour management of active medical problems listed below. Physiatrist and rehab team continue to assess barriers to discharge/monitor patient progress toward functional and medical goals  Care Tool:  Bathing    Body parts bathed by patient: Right arm, Left lower leg, Face, Left arm, Chest, Abdomen, Front perineal area, Buttocks, Right upper leg, Left upper leg, Right lower leg  Bathing assist Assist Level: Supervision/Verbal cueing     Upper Body Dressing/Undressing Upper body dressing   What is the patient wearing?: Pull over shirt    Upper body assist Assist Level: Moderate Assistance - Patient 50 - 74%    Lower Body Dressing/Undressing Lower body dressing      What is the patient wearing?: Underwear/pull up, Pants     Lower body assist Assist for lower body dressing: Maximal Assistance - Patient 25 - 49%     Toileting Toileting    Toileting assist Assist for toileting: Moderate Assistance - Patient 50 - 74%     Transfers Chair/bed transfer  Transfers assist     Chair/bed transfer assist level: Moderate Assistance - Patient 50 - 74% Chair/bed transfer assistive device: Walker, Archivist   Ambulation assist      Assist level: Minimal Assistance - Patient > 75% Assistive device: Walker-rolling Max distance: 175   Walk  10 feet activity   Assist     Assist level: Minimal Assistance - Patient > 75% Assistive device: Walker-rolling   Walk 50 feet activity   Assist    Assist level: Minimal Assistance - Patient > 75% Assistive device: Walker-rolling    Walk 150 feet activity   Assist Walk 150 feet activity did not occur:  (fatigue and imbalance)  Assist level: Minimal Assistance - Patient > 75% Assistive device: Walker-rolling    Walk 10 feet on uneven surface  activity   Assist Walk 10 feet on uneven surfaces activity did not occur:  (fatigue and imbalance)         Wheelchair     Assist Is the patient using a wheelchair?: Yes Type of Wheelchair: Manual Wheelchair activity did not occur: Safety/medical concerns  Wheelchair assist level: Supervision/Verbal cueing Max wheelchair distance: 150    Wheelchair 50 feet with 2 turns activity    Assist    Wheelchair 50 feet with 2 turns activity did not occur:  (fatigue and imbalance)   Assist Level: Supervision/Verbal cueing   Wheelchair 150 feet activity     Assist  Wheelchair 150 feet activity did not occur:  (fatigue and imbalance)   Assist Level: Supervision/Verbal cueing   Blood pressure 134/84, pulse 79, temperature 97.7 F (36.5 C), temperature source Oral, resp. rate 17, height 5\' 7"  (1.702 m), weight 82.5 kg, SpO2 99%.  Medical Problem List and Plan: 1. 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   -d/c 2/25  Con't CIR PT and OT  To have pt seen by Neurology today- d/w Dr Terrace Arabia and she gave a lot of suggestions per CIDP treatment plan- will narrow down plan with help of Neuro. .   2.  Antithrombotics: -DVT/anticoagulation:  Pharmaceutical: Lovenox             -antiplatelet therapy: aspirin 81 mg daily   3. Pain Management: Tylenol as needed             -  continue gabapentin 900 mg TID and 900 mg daily PRN             -continue Percocet 5/325 q 4 hours PRN  -Continue Cymbalta 60 mg daily   -Consider trial of Lyrica to replace gabapentin   2/6- will increase Duloxetine to 90 mg daily and stop Zoloft so can do so.   2/9 pain improving. Continue plan above  2/12- pt reports still a major issue- but will increase Duloxetine to 120 mg daily and monitor 4. Mood/Behavior/Sleep: LCSW to evaluate and provide emotional support             -continue Cymbalta 60 mg daily             -continue Zoloft 25 mg daily             -continue clonazepam 0.5 mg BID prn             -antipsychotic agents: n/a  -Patient requested increase of clonazepam due to anxiety.  He takes this medication chronically PDMP reviewed, will increase temporarily to 1 mg twice daily as needed  -1/31 pt reports anxiety is improved   2/4- pt reports anxiety is better due to Prednisone same with irritability.   2/10- appears more anxious since recovery not going as expected  2/13 - Anxiety getting worse as day of d/c gets closer 5. Neuropsych/cognition: This patient is capable of making decisions on his own behalf.   6. Skin/Wound Care: Routine skin care checks   7. Fluids/Electrolytes/Nutrition: Routine Is and Os and follow-up chemistries   8: Hypertension: monitor TID and prn             -continue Avapro 300 mg daily             -continue Toprol-XL 25 mg daily  -1/31 controlled, continue current      11/26/2023    8:11 AM 11/26/2023    5:00 AM 11/25/2023    8:47 PM  Vitals with BMI  Systolic 134 142 161  Diastolic 84 95 72  Pulse 79 81 72  2/1 Will reduce losartan to 75 mg, start amlodipine 5 mg and monitor- see no. 14 BP controlled will try on amlodipine alone 2/3 BP controlled continue current  regimen   2/4- BP a little elevated DBP- at 90, but otherwise well controlled- con't regimen  2/9 begin norvasc 2.5mg  daily for feet/hands per below  2/10- will try it again, but wasn't working when was on it before  2/11- no change so far, but will   2/12- BP usually controlled, however will wait on increase of Norvasc for foot coldness  2/13- slightly elevated this AM, however was soft last evening- won't make any changes yet 9: Hypothyroidism: continue Synthroid 125 mcg daily   10: Tobacco use: cessation counseling -Nicorette gum as needed (not using)             -? 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 end date?) -follow-up Dr. Terrace Arabia -1/30 respiratory function- NIF >-40 and IS 2500, stable. Continue to monitor -2/3 NIF/IS stable   2/4- NIF's VC OK- on ICS actually- spoke with Dr Terrace Arabia- she wants [name] to do another dosing of IVIG over a total of 4 days- 2g/kg total dose over 4 days- will arrange with Pharmacy- spoke with them as well- will also increase Prednisone to 60 mg daily- since I think it was decreased- supposed to be  on until f/u with Dr Terrace Arabia- needs BMP daily per pharmacy while on IVIG.   2/11- will reach out to to Dr Terrace Arabia since strength not too much better after IVIG-    2/12- haven't heard back from Dr Terrace Arabia- will wait to hear something- explained to pt that I don't know if there's another tx- at least form my experience other than what's been tried.   2/13- waiting to have pt seen by Neuro- d/w them this AM- they will help narrow down plan.  12: Leukocytosis: likely steroid induced, improving; no fever              -follow-up CBC with differential Thursday 1/30 and q Monday  1/3 WBC elevated but stable at 15,  likely steroid related  2/10- WBC 11.9- doing better on steroids    Latest Ref Rng & Units 11/23/2023    5:40 AM 11/16/2023    5:42 AM 11/12/2023    5:52 AM  CBC  WBC 4.0 - 10.5 K/uL 11.9  15.0  15.0   Hemoglobin  13.0 - 17.0 g/dL 09.8  11.9  14.7   Hematocrit 39.0 - 52.0 % 40.8  40.9  41.5   Platelets 150 - 400 K/uL 347  397  412     13.  Elevated ALT  -2/3 improved to 48 today  2/10- will recheck next week    14. Cold feeling in b/l legs.  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  EXTR warm, pulses are normal, do not think he needs arterial Dopplers.  Likely autonomic, trial calcium channel blocker  2/5- so far, no improvement with Norvasc 5 mg daily-   2/6- pt emphatic that it's the worst part of his pain- he also describes feet feeling wet - but are dry and warmish to touch- will increase Duloxetine to 90 mg daily and sotp Zoloft.   2/7- pt agreed to change- said nerve pain/cold in feet slightly better already- thinks Norvasc made him feel fatigued, so wants to stop   2/9 pt willing to try norvasc again for sx. Feels that it helped   -will start low dose 2.5mg  daily   2/10- will con't for now- pt told me didn't help initially, but we will see  2/11- pt says yesterday cold was SO bad, it was really painful- will con't Amlodipine. 2/12- Will increase Duloxetine to 120 mg daily- is really bothering him. Wait to increase Norvasc today  2/13- no change, but has noticed when  leaves feet down at least 15 minutes, feet get colder 15. Constipation  2/11- LBM 3 days ago- says feeling OK- wants to wait to push Sorbitol  2/12- LBM now showing as 2/10- will monitor  2/13- LBM yesterday    I spent a total of 45   minutes on total care today- >50% coordination of care- due to d/w Neuro as well as pt- rechecked all strength exam- is stable from earlier in week- will work with Neuro to narrow down exact plan for pt.    LOS: 16 days A FACE TO FACE EVALUATION WAS PERFORMED  Rayon Mcchristian 11/26/2023, 8:33 AM

## 2023-11-26 NOTE — Progress Notes (Addendum)
PRIVIGEN 10% = 100 mg / 1 mL  Start 30 mg/kg/hr X 15 minutes; recheck vitals if tolerated, increase to 60 mg/kg/hr X 15 minutes; recheck vitals if tolerated, increase to 120 mg/kg/hr X 15 minutes; recheck vitals if tolerated, increase to 240 mg/kg/hr X 15 minutes; recheck vitals (max rate for ITP) If using for PI or CIDP, then then increase to 480 mg/kg/hr X 15 minutes; recheck vitals then check vital signs hourly until infusion completed and 30 minutes after infusion completed.  For all other indications, use the outlined titration to a maximum rate of 240 mg/kg/hr..  For subsequent infusion, vital signs should be obtained pre-infusion, mid-infusion and 30 minutes post-infusion. At completion of infusion, flush tubing with 50 mL of D5W.  Directions followed in [facility] as above. Pt is tolerating infusion well at this time. Vitals are WNL. Infusion rate currently 87.2.   2330 Rate change to 174.48 for 15 VS taken and WNL  2347 rate change to 355ml/hr for 15  0005 VS taken  Rate continued at 348 ml/hr. No acute distress noted

## 2023-11-26 NOTE — Consult Note (Signed)
NEUROLOGY CONSULT NOTE   Date of service: November 26, 2023 Patient Name: Jonathan Luna MRN:  [id number] DOB:  May 11, 1966 Chief Complaint: treatment-refractory CIDP Requesting Provider: Genice Rouge, MD  History of Present Illness  Jonathan Luna is a 58 y.o. 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  Patient was hospitalized about a month ago where he got plasma exchange and was in inpatient rehab for about 2 weeks.  This admission, he is status post plasma exchange x2, IVIG x1, and currently is on high-dose steroids with continued paresthesias including severe cold sensation in lower extremities.  Patient was trialed on amlodipine for goal lower extremity without improvement and was discontinued.  Patient also had his duloxetine increased to 120 mg without improvement.  Of note patient is also on gabapentin 900 3 times daily and Percocet as needed.  On interview, reports bilateral weakness lower extremities and sensation changes. Also has issues weakness in his bilateral hands. Had worsening ever since he had bilateral pneumonia and reports that he is unable to walk, dress himself. He is able to stand up, use bathroom, and comb hair. Reports that he got IVIG in past hospitalization and had rapid >50% improvement. He was able to do his activities of daily living. This was in early January and he got out of rehab and was doing well. He then saw Dr. Terrace Arabia and was doing worse around this time and was re-hospitalized again for plasma exchange. He reported minimal improvement for plasma exchange. Then he got IVIG and had some mild improvement in weakness.   Regarding symptoms currently, he reports that the sensation changes were around 1/3 of shin and has progressed to knees. He reports that he used to experience a burning sensation in his lower legs  which has changed to freezing sensation as if his feet are in ice. He also thinks that his feet are physically cold. He reports that touch can often be pain. He reports that calves are tight. He also reports having weakness in hands and lower extremities, and he feels "shakey." He reports that compared to several days ago he is doing worse today.   He denies any history of HIV, tuberculosis, syphilis, working in healthcare, living outside of [name].   Collateral, spouse: no response, left HIPAA compliant voicemail.   ROS  Comprehensive ROS performed and pertinent positives documented in HPI   Past History   Past Medical History:  Diagnosis Date   Allergy    Anxiety    Arthritis    Cataract    Community acquired pneumonia 08/28/2023   Depression    Detached retina    Heart murmur    History of bilateral cataract extraction    Hyperlipidemia    Hypothyroidism    Low back pain    Panic attack    Panic attacks    Pneumonia of right upper lobe due to infectious organism 08/18/2023    Past Surgical History:  Procedure Laterality Date   CATARACT EXTRACTION     EYE SURGERY     IR FLUORO GUIDE CV LINE RIGHT  10/29/2023   IR US GUIDE VASC ACCESS RIGHT  10/29/2023    Family History: Family History  Problem Relation Age of Onset   Breast cancer Mother    Hypertension Father    Heart disease Father    Asthma Father  Emphysema Father        smoker for 45 years   Diabetes Brother    Hyperlipidemia Brother     Social History  reports that he has never smoked. He has been exposed to tobacco smoke. He has never used smokeless tobacco. He reports that he does not currently use alcohol. He reports that he does not use drugs.  Allergies  Allergen Reactions   Amoxil [Amoxicillin] Other (See Comments)    Pt states that it does not work for him when he had back to back pneumonia   Effexor [Venlafaxine] Diarrhea   Prozac [Fluoxetine Hcl] Diarrhea    Medications   Current  Facility-Administered Medications:    acetaminophen (TYLENOL) tablet 325-650 mg, 325-650 mg, Oral, Q4H PRN, Milinda Antis, PA-C, 650 mg at 11/14/23 0827   albuterol (PROVENTIL) (2.5 MG/3ML) 0.083% nebulizer solution 2.5 mg, 2.5 mg, Nebulization, Q4H PRN, Setzer, Lynnell Jude, PA-C   alum & mag hydroxide-simeth (MAALOX/MYLANTA) 200-200-20 MG/5ML suspension 30 mL, 30 mL, Oral, Q4H PRN, Setzer, Lynnell Jude, PA-C, 30 mL at 11/18/23 1124   amLODipine (NORVASC) tablet 2.5 mg, 2.5 mg, Oral, Daily, Faith Rogue T, MD, 2.5 mg at 11/26/23 0813   aspirin EC tablet 81 mg, 81 mg, Oral, Daily, Milinda Antis, PA-C, 81 mg at 11/26/23 1610   bisacodyl (DULCOLAX) EC tablet 5 mg, 5 mg, Oral, Daily PRN, Milinda Antis, PA-C   calcium carbonate (TUMS - dosed in mg elemental calcium) chewable tablet 400 mg of elemental calcium, 400 mg of elemental calcium, Oral, QID PRN, Milinda Antis, PA-C, 400 mg of elemental calcium at 11/20/23 1254   clonazePAM (KLONOPIN) tablet 1 mg, 1 mg, Oral, BID PRN, Fanny Dance, MD, 1 mg at 11/25/23 1949   dextromethorphan-guaiFENesin (MUCINEX DM) 30-600 MG per 12 hr tablet 1 tablet, 1 tablet, Oral, BID, Setzer, Lynnell Jude, PA-C, 1 tablet at 11/26/23 0813   diphenhydrAMINE (BENADRYL) capsule 25 mg, 25 mg, Oral, Q6H PRN, Milinda Antis, PA-C, 25 mg at 11/25/23 1949   DULoxetine (CYMBALTA) DR capsule 120 mg, 120 mg, Oral, Daily, Lovorn, Megan, MD, 120 mg at 11/26/23 0814   enoxaparin (LOVENOX) injection 40 mg, 40 mg, Subcutaneous, Q24H, Setzer, Sandra J, PA-C, 40 mg at 11/25/23 1807   gabapentin (NEURONTIN) capsule 900 mg, 900 mg, Oral, TID, Setzer, Sandra J, PA-C, 900 mg at 11/26/23 1410   gabapentin (NEURONTIN) capsule 900 mg, 900 mg, Oral, Daily PRN, Milinda Antis, PA-C, 900 mg at 11/12/23 1516   levothyroxine (SYNTHROID) tablet 125 mcg, 125 mcg, Oral, Q0600, Milinda Antis, PA-C, 125 mcg at 11/26/23 0434   metoprolol succinate (TOPROL-XL) 24 hr tablet 25 mg, 25 mg, Oral,  Daily, Setzer, Sandra J, PA-C, 25 mg at 11/26/23 9604   nicotine polacrilex (NICORETTE) gum 2 mg, 2 mg, Oral, PRN, Setzer, Lynnell Jude, PA-C   ondansetron [facility]) tablet 4 mg, 4 mg, Oral, Q6H PRN, 4 mg at 11/19/23 1401 **OR** ondansetron (ZOFRAN) injection 4 mg, 4 mg, Intravenous, Q6H PRN, Setzer, Sandra J, PA-C   oxyCODONE-acetaminophen (PERCOCET/ROXICET) 5-325 MG per tablet 1 tablet, 1 tablet, Oral, Q4H PRN, Setzer, Lynnell Jude, PA-C, 1 tablet at 11/26/23 1311   polyethylene glycol (MIRALAX / GLYCOLAX) packet 17 g, 17 g, Oral, Daily PRN, Setzer, Sandra J, PA-C   predniSONE (DELTASONE) tablet 60 mg, 60 mg, Oral, Q breakfast, Lovorn, Megan, MD, 60 mg at 11/26/23 5409   sodium phosphate (FLEET) enema 1 enema, 1 enema, Rectal, Once PRN, Setzer, Lynnell Jude, PA-C  Vitals   Vitals:   11/25/23 1437 11/25/23 2047 11/26/23 0500 11/26/23 0811  BP: 134/79 107/72 (!) 142/95 134/84  Pulse: 86 72 81 79  Resp: 18 17 17    Temp: 98.2 F (36.8 C) 98 F (36.7 C) 97.6 F (36.4 C) 97.7 F (36.5 C)  TempSrc:    Oral  SpO2: 98% 97% 99% 99%  Weight:      Height:        Body mass index is 28.49 kg/m.  Physical Exam   Constitutional: Appears well-developed and well-nourished.  In a wheelchair. Psych: Affect appropriate to situation.  Eyes: No scleral injection.  Evidence of past irodotomy  HENT: No OP obstruction.  Head: Normocephalic.  Cardiovascular: Normal rate and regular rhythm.  Respiratory: Effort normal, non-labored breathing.  GI: Soft.  No distension. There is no tenderness.  Skin: WDI.   Neurologic Examination   MENTAL STATUS: AAOx3, attention intact evidence by spelling forward/backwards and per conversation. Executive function intact. fund of knowledge appropriate.   LANG/SPEECH: Naming and repetition intact, fluent, follows 3-step commands. No dysarthria with complex phrases.   CRANIAL NERVES: II: Pupils equal and reactive, no RAPD, no visual field deficits, appropriate to past retinal  detachment.  III, IV, VI: EOM intact, no gaze preference or deviation, no nystagmus. V: normal sensation in V1, V2, and V3 segments bilaterally VII: no asymmetry, no nasolabial fold flattening VIII: normal hearing to speech IX, X: normal palatal elevation, no uvular deviation XI: 5/5 head turn and 5/5 shoulder shrug bilaterally XII: midline tongue protrusion  MOTOR: 3/5 left upper extremity distal muscles. 4/5 L shoulder.  3+ right upper extremity distal muscles. 4/5 R shoulder  4/5 bilateral hip flexion 4/5 knee flexion / extension 3/5 dorsiflexion bialterally  4/5 plantar felxion  REFLEXES: 2/4 throughout, bilateral flexor plantar response  SENSORY: Cannot feel temperature below knee bilaterally.  Vibration decreased on knees and hands bilaterally.  Sensation intact to light touch throughout No hemineglect, no extinction to double sided stimulation (visual & tactile)  COORD: Dyssynergia with upper extremity strength testing (L>R). Tremors with exertions of bilateral upper extremities.  STATION: Deferred due to weakness GAIT: Deferred due to weakness   Labs/Imaging/Neurodiagnostic studies   CBC:  Recent Labs  Lab 09-Dec-2023 0540  WBC 11.9*  HGB 13.5  HCT 40.8  MCV 91.5  PLT 347   Basic Metabolic Panel:  Lab Results  Component Value Date   NA 142 09-Dec-2023   K 3.6 Dec 09, 2023   CO2 25 Dec 09, 2023   GLUCOSE 77 December 09, 2023   BUN 10 12/09/23   CREATININE 0.81 12-09-23   CALCIUM 9.7 09-Dec-2023   GFRNONAA >60 12/09/2023   GFRAA 94 08/16/2008   Lipid Panel:  Lab Results  Component Value Date   LDLCALC 106 (H) 10/08/2023   HgbA1c:  Lab Results  Component Value Date   HGBA1C 5.3 09/22/2023   Urine Drug Screen:     Component Value Date/Time   LABOPIA NEG 09/29/2012 0830   COCAINSCRNUR Negative 08/18/2023 1114   COCAINSCRNUR NEG 09/29/2012 0830   LABBENZ POS (A) 09/29/2012 0830   AMPHETMU NEG 09/29/2012 0830    Alcohol Level No results found for:  "ETH" INR  Lab Results  Component Value Date   INR 1.0 08/29/2023   APTT  Lab Results  Component Value Date   APTT 32 08/29/2023   IgG 1/16: 1206 IgA 1/16: 582 TSH, T4: Within normal limits  CT Head without contrast(Personally reviewed) 10/25/2023: No acute intracranial findings.  MRI brain without  contrast (Personally reviewed) 09/30/2024: Normal brain MRI.    ASSESSMENT   Jonathan Luna is a 57 y.o. 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  He is treatment refractory to plasma exchange therapy x 1, IVIG x 2, and high-dose steroids currently.  Discussed with Dr. Terrace Arabia plan to re-dose maintenance IVIG and initiate rituximab.  Discussed with patient the benefits given evidence in treatment refractory CIDP and risks including susceptibility to infection.  For labs prior to starting rituximab, CBC within normal limits, immunoglobulin levels appropriate, will order quantiferon to rule out TB, and acute hepatitis panel.   RECOMMENDATIONS  Maintenance IVIG 1G/Kg every other week. Continue prednisone 60 mg once daily Acute hepatitis screen, Quantiferon gold plus. Hold off on Rituximab given noted RUL lesion on CXR from dec 2024. Will hold off until quantiferon results. ______________________________________________________________________    Meryl Dare, MD PGY-1 Psychiatry Resident 11/26/2023, 5:45 PM   NEUROHOSPITALIST ADDENDUM Performed a face to face diagnostic evaluation.   I have reviewed the contents of history and physical exam as documented by PA/ARNP/Resident and agree with above documentation.  I have discussed and formulated the above plan as documented. Edits to the note have been made as needed.  Impression/Key exam findings/Plan: He improved about 50% after first IVIG treatment but then declined again.  He underwent PLEX with no significant improvement and then another round of IVIG in rehab with slight 10-20% improvement. He is on steroid but feels that it has not helped. Discussed with Dr. Terrace Arabia with Neuromuscular team and concern for treatment refractory CIDP. Will escalate treatment and do maintenance IVIG 1G/Kg over 2 days every 2 weeks and also Rituximab. However, prior to Rituximab, want to rule out Tb. His chest X ray from dec 2024 showed a RUL pneumonia or underlying mass/lesion. I definitely want to rule out TB prior to Rituximab with the noted CXR findings.  Erick Blinks, MD [facility] [phone]   If 7pm to 7am, please call on call as listed on AMION.

## 2023-11-26 NOTE — Plan of Care (Signed)
  Problem: Consults Goal: RH SPINAL CORD INJURY PATIENT EDUCATION Description:  See Patient Education module for education specifics.  Outcome: Progressing   Problem: RH SAFETY Goal: RH STG ADHERE TO SAFETY PRECAUTIONS W/ASSISTANCE/DEVICE Description: STG Adhere to Safety Precautions With  cues Assistance/Device. Outcome: Progressing   Problem: RH KNOWLEDGE DEFICIT SCI Goal: RH STG INCREASE KNOWLEDGE OF SELF CARE AFTER SCI Description: Patient and spouse will be able to manage care at discharge using educational resources for medications and dietary modification independently.  Outcome: Progressing

## 2023-11-26 NOTE — Telephone Encounter (Signed)
Call to patient, advised any treatment in rehab would be order by that physician. He is aware to call us when he is discharged for next infusion date. Already approved for infusion at infusion suite.

## 2023-11-26 NOTE — Progress Notes (Signed)
Occupational Therapy Session Note  Patient Details  Name: Jonathan Luna MRN: [id number] Date of Birth: May 09, 1966  Today's Date: 11/26/2023 OT Individual Time: 5621-3086 & 5784-6962 OT Individual Time Calculation (min): 60 min & 40 min Missed minutes: 35 min d/t neurology visit   Short Term Goals: Week 3:  OT Short Term Goal 1 (Week 3): Pt will complete LB dressing at Min A with AE as necessary OT Short Term Goal 2 (Week 3): Pt will complete toilet transfer at SBA with AD OT Short Term Goal 3 (Week 3): Pt will complete dynamic standing activities with Min A  Skilled Therapeutic Interventions/Progress Updates:      Therapy Documentation Precautions:  Precautions Precautions: Fall Restrictions Weight Bearing Restrictions Per Provider Order: No Session 1 General: "Jonathan Luna" Pt supine in bed upon OT arrival, agreeable to OT session. Pt completing UB exercises upon OT arrival with red theraband  Pain:  6.5/10 pain reported in low back and calves, activity, intermittent rest breaks, distractions provided for pain management, pt reports tolerable to proceed.   ADL: Pt completed full ADL this AM in order to increase independence with ADL tasks, especially LB ADL tasks. Pt completed the following at listed levels of assist: Bed mobility: SBA supine>EOB with increased time Grooming:  Oral hygiene: Toilet transfer: CGA with RW ambulating from bed>standard toilet Toileting: CGA with increased time to manage pants down, able to hook thumb into waist band to manage over waist UB dressing: SBA seated on TTB donning/doffing overhead shirt LB dressing: Min A, OT recommending not wearing underwear d/t increased difficulty for pt donning, pt able to don shorts with Min A overall, VC to don pants over weaker leg first Footwear: total A, assistance for donning socks and AFOs for energy conservation Shower transfer: CGA with RW from toilet>TTB, no LOB or knee buckling Bathing: SBA overall, pt able to  squeeze soap bottle, which was not able to do previously, pt seated for shower doing lateral leans for peri hygiene   Pt seated in W/C at end of session with W/C alarm donned, call light within reach and 4Ps assessed.    Session 2 General: "[name]" Pt seated in W/C upon OT arrival, agreeable to OT.  Pain: 7/10 pain reported in low back and calves, activity, intermittent rest breaks, distractions provided for pain management, pt reports tolerable to proceed.   ADL: Pt issued weighted drinking cup for increased independence with self feeding and control of utensil d/t ataxia, pt demo'd with increased independence. Pt dependently transported to dance group, neurology came to see pt so transported dependently back to room for doctors visit.  Other Treatments: Neurology came to see pt during session, missing 35 min of session. Pt after doctors visit transferred back to bed with Min A stand pivot transfer using bed rails and increased time. Pt able to complete EOB>supine at SBA. OT gave pt heated blanket and wrapped feet d/t reporting of being very cold. Pt reported to OT what neurologist said and was worried about being immunocompromised. OT educated pt on topic and pt reported increased understanding.   Pt supine in bed with bed alarm activated, 2 bed rails up, call light within reach and 4Ps assessed.   Therapy/Group: Individual Therapy  Velia Meyer, OTD, OTR/L 11/26/2023, 3:53 PM

## 2023-11-27 ENCOUNTER — Inpatient Hospital Stay (HOSPITAL_COMMUNITY): Payer: 59

## 2023-11-27 NOTE — Progress Notes (Signed)
Physical Therapy Session Note  Patient Details  Name: Jonathan Luna MRN: [id number] Date of Birth: [birth year]  Today's Date: 11/27/2023 PT Individual Time: 0950-1100, 1300-1400 PT Individual Time Calculation (min): 70 min, 60 min  Short Term Goals: Week 3:  PT Short Term Goal 1 (Week 3): Pt will require minA with steps x2 with HR PT Short Term Goal 2 (Week 3): Pt will ambulate 150 feet with LRAD and SBA PT Short Term Goal 3 (Week 3): Pt will maintain dynamic standing balance with SBA and LRAD for duration of therapeutic activity (Wii, connect 4 game, etc). PT Short Term Goal 4 (Week 3): Pt will perform sit<>stand with supervision and LRAD  Skilled Therapeutic Interventions/Progress Updates:     Session 1: Pt received seated in w/c and agreeable to therapy session. Notes fatigue d/t only getting 2 hours of sleep with IVIG treatment last night. Does not note any pain at this time, just cold and numbness in BLE - has informed Dr. Berline Chough.  Gait training with 1 seated rest break from room to therapy gym - CGA for sit<>stands and SBA for ambulation. Pt continues to display minor R hyperextension thrust, but decreased compared to initial gait trial yesterday. Verbal cues for relaxing shoulders and RW proximity.   SPT demonstrated RW sequencing for curb step on 5" step. Pt performed step up with LLE and modA d/t LE weakness and stabilization of RW and step down with modA and decreased eccentric control, before ~100' gait with SBA and same verbal cues as above. Seated rest break before ~100' gait training and second trial of curb step with RLE leading - minA and pt able to stabilize RW independently, continued to demonstrate decreased eccentric control.  Discussed importance of training quad neuromuscular control to prevent long term hyperextension thrust.   TherEx for quad control and strength: - 1x8 4' step-ups in parallel bars with CGA-minA: significant BUE support, hyperextension thrust  bilaterally - 2x10 TKE with red theraband and verbal cues to avoid locking knee and slowly controlling motion - 1x4 4' step ups with CGA: improved extension control, only one instance of hyperextension thrust   Pt dependently transported back to room and left seated in w/c with all needs in reach.    Session 2: Pt received seated in w/c and agreeable to therapy, although notes increased sensation of LE cold. Pt reports unrated LBP, nsg brought pain medication during session.  Pt ambulates to dayroom with CGA d/t self report of feeling weaker, verbal cues for shoulders down and proximity to walker. Encouraged pt to monitor fatigue levels tomorrow after a better night of sleep and discuss possible impact of IVIG on fatigue levels with MD.   Seated balance activities: improve trunk control and UE coordination and endurance - x20 seated physioball pass: BLE supported and SBA - x20 seated physioball pass with BLE unsupported, SBA - x20 seated physioball pass with BLE unsupported, passing outside of pt's BOS - close supervision, all LOB self-corrected - 2x4 seated contralateral reaching with clothes-pin pinch exercise, SBA and verbal cues to prop on hands or elbows for support during reaching - 2x4 standing contralateral reaching with clothes-pin pinch, CGA with RW. Increased shoulder shrug and UE compensation and use of mat table for support during second trial d/t fatigue  Pt self propelled with BLE and supervision back to room, alternating between propelling forwards and backwards d/t fatigue. Pt performed stand pivot transfer with RW and CGA d/t fatigue to bed. Pt left supine in bed with  all needs in reach and alarm on.   Therapy Documentation Precautions:  Precautions Precautions: Fall Restrictions Weight Bearing Restrictions Per Provider Order: No General:     Therapy/Group: Individual Therapy  Collins Scotland 11/27/2023, 4:00 PM

## 2023-11-27 NOTE — Consult Note (Signed)
NEUROLOGY CONSULT NOTE   Date of service: November 27, 2023 Patient Name: Jonathan Luna MRN:  [id number] DOB:  21-Feb-1966 Chief Complaint: treatment-refractory CIDP Requesting Provider: Genice Rouge, MD  History of Present Illness  Jonathan Luna is a 58 y.o. 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  Patient was hospitalized about a month ago where he got plasma exchange and was in inpatient rehab for about 2 weeks.  This admission, he is status post plasma exchange x2, IVIG x1, and currently is on high-dose steroids with continued paresthesias including severe cold sensation in lower extremities.  Patient was trialed on amlodipine for goal lower extremity without improvement and was discontinued.  Patient also had his duloxetine increased to 120 mg without improvement.  Of note patient is also on gabapentin 900 3 times daily and Percocet as needed.  Patient reports no side effects to IVIG since initiation.  In the morning he reported some mild improvement but in the late afternoon after doing exercises he reports that improvement has been minimal compared to his first trial of IVIG.  Discussed with patient today that we need to rule out tuberculosis prior to initiating rituximab.  Discussed that the neck step is CT to better characterize the upper lobe lesion and importance of ruling out tuberculosis and ruling out cancer given that paraneoplastic process could be causing presenting condition.    ROS  Comprehensive ROS performed and pertinent positives documented in HPI   Past History   Past Medical History:  Diagnosis Date   Allergy    Anxiety    Arthritis    Cataract    Community acquired pneumonia 08/28/2023   Depression    Detached retina    Heart murmur    History of bilateral cataract extraction    Hyperlipidemia     Hypothyroidism    Low back pain    Panic attack    Panic attacks    Pneumonia of right upper lobe due to infectious organism 08/18/2023    Past Surgical History:  Procedure Laterality Date   CATARACT EXTRACTION     EYE SURGERY     IR FLUORO GUIDE CV LINE RIGHT  10/29/2023   IR US GUIDE VASC ACCESS RIGHT  10/29/2023    Family History: Family History  Problem Relation Age of Onset   Breast cancer Mother    Hypertension Father    Heart disease Father    Asthma Father    Emphysema Father        smoker for 45 years   Diabetes Brother    Hyperlipidemia Brother     Social History  reports that he has never smoked. He has been exposed to tobacco smoke. He has never used smokeless tobacco. He reports that he does not currently use alcohol. He reports that he does not use drugs.  Allergies  Allergen Reactions   Amoxil [Amoxicillin] Other (See Comments)    Pt states that it does not work for him when he had back to back pneumonia   Effexor [Venlafaxine] Diarrhea   Prozac [Fluoxetine Hcl] Diarrhea    Medications   Current Facility-Administered Medications:    acetaminophen (TYLENOL) tablet 325-650 mg, 325-650 mg, Oral, Q4H PRN, Milinda Antis, PA-C, 650 mg at 11/14/23 0827   albuterol (PROVENTIL) (2.5 MG/3ML) 0.083% nebulizer solution 2.5 mg, 2.5 mg, Nebulization, Q4H PRN,  Setzer, Lynnell Jude, PA-C   alum & mag hydroxide-simeth (MAALOX/MYLANTA) 200-200-20 MG/5ML suspension 30 mL, 30 mL, Oral, Q4H PRN, Milinda Antis, PA-C, 30 mL at 11/18/23 1124   amLODipine (NORVASC) tablet 2.5 mg, 2.5 mg, Oral, Daily, Faith Rogue T, MD, 2.5 mg at 11/27/23 5284   aspirin EC tablet 81 mg, 81 mg, Oral, Daily, Milinda Antis, PA-C, 81 mg at 11/27/23 1324   bisacodyl (DULCOLAX) EC tablet 5 mg, 5 mg, Oral, Daily PRN, Milinda Antis, PA-C   calcium carbonate (TUMS - dosed in mg elemental calcium) chewable tablet 400 mg of elemental calcium, 400 mg of elemental calcium, Oral, QID PRN, Milinda Antis, PA-C, 400 mg of elemental calcium at 11/26/23 2308   clonazePAM (KLONOPIN) tablet 1 mg, 1 mg, Oral, BID PRN, Fanny Dance, MD, 1 mg at 11/26/23 2029   dextromethorphan-guaiFENesin (MUCINEX DM) 30-600 MG per 12 hr tablet 1 tablet, 1 tablet, Oral, BID, Setzer, Lynnell Jude, PA-C, 1 tablet at 11/26/23 0813   diphenhydrAMINE (BENADRYL) capsule 25 mg, 25 mg, Oral, Q6H PRN, Milinda Antis, PA-C, 25 mg at 11/26/23 2030   DULoxetine (CYMBALTA) DR capsule 120 mg, 120 mg, Oral, Daily, Lovorn, Megan, MD, 120 mg at 11/27/23 0737   enoxaparin (LOVENOX) injection 40 mg, 40 mg, Subcutaneous, Q24H, Setzer, Sandra J, PA-C, 40 mg at 11/27/23 1801   gabapentin (NEURONTIN) capsule 900 mg, 900 mg, Oral, TID, Setzer, Lynnell Jude, PA-C, 900 mg at 11/27/23 1333   gabapentin (NEURONTIN) capsule 900 mg, 900 mg, Oral, Daily PRN, Milinda Antis, PA-C, 900 mg at 11/27/23 1604   Immune Globulin 10% (PRIVIGEN) IV infusion 35 g, 500 mg/kg (Adjusted), Intravenous, Q24 Hr x 2, Erick Blinks, MD, Last Rate: 348 mL/hr at 11/27/23 1559, Infusion Verify at 11/27/23 1559   levothyroxine (SYNTHROID) tablet 125 mcg, 125 mcg, Oral, Q0600, Milinda Antis, PA-C, 125 mcg at 11/27/23 0544   metoprolol succinate (TOPROL-XL) 24 hr tablet 25 mg, 25 mg, Oral, Daily, Setzer, Lynnell Jude, PA-C, 25 mg at 11/27/23 4010   nicotine polacrilex (NICORETTE) gum 2 mg, 2 mg, Oral, PRN, Setzer, Lynnell Jude, PA-C   ondansetron [facility]) tablet 4 mg, 4 mg, Oral, Q6H PRN, 4 mg at 11/19/23 1401 **OR** ondansetron (ZOFRAN) injection 4 mg, 4 mg, Intravenous, Q6H PRN, Setzer, Sandra J, PA-C   oxyCODONE-acetaminophen (PERCOCET/ROXICET) 5-325 MG per tablet 1 tablet, 1 tablet, Oral, Q4H PRN, Setzer, Lynnell Jude, PA-C, 1 tablet at 11/27/23 1801   polyethylene glycol (MIRALAX / GLYCOLAX) packet 17 g, 17 g, Oral, Daily PRN, Setzer, Sandra J, PA-C   predniSONE (DELTASONE) tablet 60 mg, 60 mg, Oral, Q breakfast, Lovorn, Megan, MD, 60 mg at 11/27/23 0737   sodium  phosphate (FLEET) enema 1 enema, 1 enema, Rectal, Once PRN, Milinda Antis, PA-C  Vitals   Vitals:   11/27/23 0053 11/27/23 0125 11/27/23 0200 11/27/23 0500  BP: 131/78 131/86 121/76 133/73  Pulse: 66 61 60 69  Resp: 18 18 17 18   Temp: 97.8 F (36.6 C) 98 F (36.7 C) 98.1 F (36.7 C) 98 F (36.7 C)  TempSrc: Oral Oral Oral Oral  SpO2: 97% 96% 96%   Weight:      Height:        Body mass index is 28.49 kg/m.  Physical Exam   Constitutional: Appears well-developed and well-nourished.  Laying in bed. Psych: Affect appropriate to situation.  Eyes: No scleral injection.  Evidence of past irodotomy  HENT: No OP obstruction.  Head: Normocephalic.  Cardiovascular: Normal rate and regular rhythm.  Respiratory: Effort normal, non-labored breathing.  GI: Soft.  No distension. There is no tenderness.  Skin: WDI.   Neurologic Examination   MENTAL STATUS: AAOx3, attention intact evidence by spelling forward/backwards and per conversation. fund of knowledge appropriate.   LANG/SPEECH: Naming and repetition intact, fluent, follows 3-step commands. No dysarthria with complex phrases.   CRANIAL NERVES: II: Pupils equal and reactive, no RAPD, no visual field deficits, appropriate to past retinal detachment.  III, IV, VI: EOM intact, no gaze preference or deviation, no nystagmus. V: normal sensation in V1, V2, and V3 segments bilaterally VII: no asymmetry, no nasolabial fold flattening VIII: normal hearing to speech IX, X: normal palatal elevation, no uvular deviation XI: 5/5 head turn and 5/5 shoulder shrug bilaterally XII: midline tongue protrusion  MOTOR: Muscle bulk: Borderline normal, tone slightly decreased, tremor with movement Mvmt Root Nerve  Muscle Right Left Comments  SA C5/6 Ax Deltoid 4 4   EF C5/6 Mc Biceps 4 4   EE C6/7/8 Rad Triceps 4 4   WF C6/7 Med FCR 3 3   WE C7/8 PIN ECU 3 3   F Ab C8/T1 U ADM/FDI     HF L1/2/3 Fem Illopsoas 4+ 4+   KE L2/3/4 Fem Quad 4+  4+   DF L4/5 D Peron Tib Ant 3 3   PF S1/2 Tibial Grc/Sol 4 4     REFLEXES: 2/4 throughout, bilateral flexor plantar response  SENSORY: Cannot feel temperature below knee bilaterally.  Vibration decreased on knees and hands bilaterally.  Sensation intact to light touch throughout No hemineglect, no extinction to double sided stimulation (visual & tactile)  COORD: Dyssynergia with upper extremity strength testing (L>R). Tremors with exertions of bilateral upper extremities.  STATION: Deferred due to weakness GAIT: Deferred due to weakness   Labs/Imaging/Neurodiagnostic studies   CBC:  Recent Labs  Lab November 27, 2023 0540  WBC 11.9*  HGB 13.5  HCT 40.8  MCV 91.5  PLT 347   Basic Metabolic Panel:  Lab Results  Component Value Date   NA 142 11/27/2023   K 3.6 11/27/2023   CO2 25 2023-11-27   GLUCOSE 77 Nov 27, 2023   BUN 10 27-Nov-2023   CREATININE 0.81 Nov 27, 2023   CALCIUM 9.7 27-Nov-2023   GFRNONAA >60 2023-11-27   GFRAA 94 08/16/2008   Lipid Panel:  Lab Results  Component Value Date   LDLCALC 106 (H) 10/08/2023   HgbA1c:  Lab Results  Component Value Date   HGBA1C 5.3 09/22/2023   Urine Drug Screen:     Component Value Date/Time   LABOPIA NEG 09/29/2012 0830   COCAINSCRNUR Negative 08/18/2023 1114   COCAINSCRNUR NEG 09/29/2012 0830   LABBENZ POS (A) 09/29/2012 0830   AMPHETMU NEG 09/29/2012 0830     INR  Lab Results  Component Value Date   INR 1.0 08/29/2023   APTT  Lab Results  Component Value Date   APTT 32 08/29/2023   IgG 1/16: 1206 IgA 1/16: 582 TSH, T4: Within normal limits Acute hepatitis panel: Negative QuantiFERON: Pending Paraneoplastic panel: Pending  CT Head without contrast(Personally reviewed) 10/25/2023: No acute intracranial findings.  MRI brain without contrast (Personally reviewed) 09/30/2024: Normal brain MRI.    ASSESSMENT   SZYMON FOILES is a 58 y.o. 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  He improved  about 50% after first IVIG treatment but then declined again. He underwent PLEX with no significant improvement and then another round of IVIG in rehab with slight 10-20% improvement. He is on steroid but feels that it has not helped. Discussed with Dr. Terrace Arabia with Neuromuscular team and concern for treatment refractory CIDP. Will escalate treatment and do maintenance IVIG 1G/Kg over 2 days every 2 weeks and also Rituximab. However, prior to Rituximab, want to rule out Tb. His chest X ray from dec 2024 showed a RUL pneumonia or underlying mass/lesion. I definitely want to rule out TB prior to Rituximab with the noted CXR findings.  Pending CT scan of chest to further characterize upper lobe lesion to rule out tuberculosis prior to starting rituximab and also to rule out malignancy which may be producing paraneoplastic syndrome.  Acute hepatitis panel negative but QuantiFERON and paraneoplastic pending.  RECOMMENDATIONS  Maintenance IVIG 1G/Kg (over 2 days) every other week. Continue prednisone 60 mg once daily Quantiferon gold plus Hold off on Rituximab given noted RUL lesion on CXR from dec 2024. Will hold off until quantiferon results. Getting CT chest to evaluate noted RUL lesion. ______________________________________________________________________    Meryl Dare, MD PGY-1 Psychiatry Resident 11/27/2023, 6:19 PM  NEUROHOSPITALIST ADDENDUM Performed a face to face diagnostic evaluation.   I have reviewed the contents of history and physical exam as documented by PA/ARNP/Resident and agree with above documentation.  I have discussed and formulated the above plan as documented. Edits to the note have been made as needed.  Impression/Key exam findings/Plan: waiting on clarification on the noted RUL lesion on CXR and CT chest from dec 2024 prior to  initiating Rituximab. Quantiferon gold plus is still pending.  Was considering getting serum Paraneoplastic panel but would be unreliable after IVIG.  Erick Blinks, MD [facility] [phone]   If 7pm to 7am, please call on call as listed on AMION.

## 2023-11-27 NOTE — Progress Notes (Signed)
PROGRESS NOTE   Subjective/Complaints:  Pt reports got IVIG til 2:30am last night- didn't sleep well as a result. Said only getting for 2 days this time.   Per Neuro getting chest CT to make sure no neoplastic process since had RUL opacity in December 2024- and getting quantiferon for TB to be checked. .      Ate 100% tray.  Said   ROS:    Pt denies SOB, abd pain, CP, N/V/C/D, and vision changes   Cold feet (+) Negative except for HPI and cold feet  Objective:   No results found. No results for input(s): "WBC", "HGB", "HCT", "PLT" in the last 72 hours.    No results for input(s): "NA", "K", "CL", "CO2", "GLUCOSE", "BUN", "CREATININE", "CALCIUM" in the last 72 hours.    Intake/Output Summary (Last 24 hours) at 11/27/2023 1126 Last data filed at 11/27/2023 0755 Gross per 24 hour  Intake 980 ml  Output 600 ml  Net 380 ml        Physical Exam: Vital Signs Blood pressure 133/73, pulse 69, temperature 98 F (36.7 C), temperature source Oral, resp. rate 18, height 5\' 7"  (1.702 m), weight 82.5 kg, SpO2 96%.     General: awake, alert, appropriate, sitting up in bed; done with 100% tray; NAD HENT: conjugate gaze; oropharynx moist CV: regular rate and rhythm; no JVD Pulmonary: CTA B/L; no W/R/R- good air movement GI: soft, NT, ND, (+)BS Psychiatric: appropriate Neurological: Ox3  Extremities:- cool/a little colder today   Biceps and triceps 4+/5; WE 0/5; Grip 2/5 and on R FA 0/5 and on L 1/5 - rechecked again today RLE- HF 4 to 4+/5 KE 4/5; DF 0/5; PF 3+/5 LLE- HF 4 to 4+/5; KE 4/5; DF 0/5 and PF 3+/5 Extremities:- no LE swelling in knees or ankles B/L  Neuro:  Decreased to light touch from elbows to finger tips B/L. L>R Musculoskeletal:  General: Normal range of motion.     Cervical back: Neck supple. No tenderness.     Comments: RUE- biceps/triceps 4/5; WE 2-/5; Grip 3-/5 and FA 0/5 LUE- biceps and  triceps 4/5; WE 0-1/5; Grip 2+/5 and FA 1/5 RLE- HF 4-/5; KE 4/5; KF 4-/5; DF 2/5 and PF 2+/5 LLE- HF 4-/5; KE 4/5; KF 4-/5 DF 1/5 and PF 2-/5 A little flaccid in UEs.   Assessment/Plan: 1. Functional deficits which require 3+ hours per day of interdisciplinary therapy in a comprehensive inpatient rehab setting. Physiatrist is providing close team supervision and 24 hour management of active medical problems listed below. Physiatrist and rehab team continue to assess barriers to discharge/monitor patient progress toward functional and medical goals  Care Tool:  Bathing    Body parts bathed by patient: Right arm, Left lower leg, Face, Left arm, Chest, Abdomen, Front perineal area, Buttocks, Right upper leg, Left upper leg, Right lower leg         Bathing assist Assist Level: Supervision/Verbal cueing     Upper Body Dressing/Undressing Upper body dressing   What is the patient wearing?: Pull over shirt    Upper body assist Assist Level: Moderate Assistance - Patient 50 - 74%    Lower Body Dressing/Undressing  Lower body dressing      What is the patient wearing?: Underwear/pull up, Pants     Lower body assist Assist for lower body dressing: Maximal Assistance - Patient 25 - 49%     Toileting Toileting    Toileting assist Assist for toileting: Moderate Assistance - Patient 50 - 74%     Transfers Chair/bed transfer  Transfers assist     Chair/bed transfer assist level: Contact Guard/Touching assist Chair/bed transfer assistive device: Walker, Archivist   Ambulation assist      Assist level: Contact Guard/Touching assist Assistive device: Walker-rolling Max distance: 175   Walk 10 feet activity   Assist     Assist level: Contact Guard/Touching assist Assistive device: Walker-rolling   Walk 50 feet activity   Assist    Assist level: Contact Guard/Touching assist Assistive device: Walker-rolling    Walk 150 feet  activity   Assist Walk 150 feet activity did not occur:  (fatigue and imbalance)  Assist level: Contact Guard/Touching assist Assistive device: Walker-rolling    Walk 10 feet on uneven surface  activity   Assist Walk 10 feet on uneven surfaces activity did not occur: Safety/medical concerns (did not attempt d/t pt fatigue)         Wheelchair     Assist Is the patient using a wheelchair?: Yes Type of Wheelchair: Manual Wheelchair activity did not occur: Safety/medical concerns  Wheelchair assist level: Supervision/Verbal cueing Max wheelchair distance: 150    Wheelchair 50 feet with 2 turns activity    Assist    Wheelchair 50 feet with 2 turns activity did not occur:  (fatigue and imbalance)   Assist Level: Supervision/Verbal cueing   Wheelchair 150 feet activity     Assist  Wheelchair 150 feet activity did not occur:  (fatigue and imbalance)   Assist Level: Supervision/Verbal cueing   Blood pressure 133/73, pulse 69, temperature 98 F (36.7 C), temperature source Oral, resp. rate 18, height 5\' 7"  (1.702 m), weight 82.5 kg, SpO2 96%.  Medical Problem List and Plan: 1. 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             -will be on Prednisone 60 mg daily until f/u with Neurology outpatient             -patient may  shower             -ELOS/Goals: 2-3 weeks- supervision to mod I             -d/c 2/25  Con't CIR PT and OT  Thank you Neuro for seeing pt- a thorough w/u and working on determining if can have Rituximab- and determining if cause of Neoplastic cause that could be triggering CIDP  2.  Antithrombotics: -DVT/anticoagulation:  Pharmaceutical: Lovenox             -antiplatelet therapy: aspirin 81 mg daily   3. Pain Management: Tylenol as needed             -continue gabapentin 900 mg TID and 900 mg daily PRN             -continue Percocet 5/325 q 4 hours PRN  -Continue Cymbalta 60 mg daily   -Consider trial of Lyrica to replace  gabapentin   2/6- will increase Duloxetine to 90 mg daily and stop Zoloft so can do so.   2/9 pain improving. Continue plan above  2/12- pt reports still a major issue- but will increase Duloxetine to 120 mg  daily and monitor  2/14- no improvement so far, but just started yesterday with increased dose-  4. Mood/Behavior/Sleep: LCSW to evaluate and provide emotional support             -continue Cymbalta 60 mg daily             -continue Zoloft 25 mg daily             -continue clonazepam 0.5 mg BID prn             -antipsychotic agents: n/a  -Patient requested increase of clonazepam due to anxiety.  He takes this medication chronically PDMP reviewed, will increase temporarily to 1 mg twice daily as needed  -1/31 pt reports anxiety is improved   2/4- pt reports anxiety is better due to Prednisone same with irritability.   2/10- appears more anxious since recovery not going as expected  2/13 - Anxiety getting worse as day of d/c gets closer 5. Neuropsych/cognition: This patient is capable of making decisions on his own behalf.   6. Skin/Wound Care: Routine skin care checks   7. Fluids/Electrolytes/Nutrition: Routine Is and Os and follow-up chemistries   8: Hypertension: monitor TID and prn             -continue Avapro 300 mg daily             -continue Toprol-XL 25 mg daily  -1/31 controlled, continue current      11/27/2023    5:00 AM 11/27/2023    2:00 AM 11/27/2023    1:25 AM  Vitals with BMI  Systolic 133 121 161  Diastolic 73 76 86  Pulse 69 60 61  2/1 Will reduce losartan to 75 mg, start amlodipine 5 mg and monitor- see no. 14 BP controlled will try on amlodipine alone 2/3 BP controlled continue current regimen   2/4- BP a little elevated DBP- at 90, but otherwise well controlled- con't regimen  2/9 begin norvasc 2.5mg  daily for feet/hands per below  2/10- will try it again, but wasn't working when was on it before  2/11- no change so far, but will   2/12- BP usually  controlled, however will wait on increase of Norvasc for foot coldness  2/13- slightly elevated this AM, however was soft last evening- won't make any changes yet  2/14- BP controlled- but can increase Norvasc to 5 mg over weekend if need be for foot coldness 9: Hypothyroidism: continue Synthroid 125 mcg daily   10: Tobacco use: cessation counseling -Nicorette gum as needed (not using)             -? 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 end date?) -follow-up Dr. Terrace Arabia -1/30 respiratory function- NIF >-40 and IS 2500, stable. Continue to monitor -2/3 NIF/IS stable   2/4- NIF's VC OK- on ICS actually- spoke with Dr Terrace Arabia- she wants [name] to do another dosing of IVIG over a total of 4 days- 2g/kg total dose over 4 days- will arrange with Pharmacy- spoke with them as well- will also increase Prednisone to 60 mg daily- since I think it was decreased- supposed to be on until f/u with Dr Terrace Arabia- needs BMP daily per pharmacy while on IVIG.   2/11- will reach out to to Dr Terrace Arabia since strength not too much better after IVIG-    2/12- haven't heard back from Dr Terrace Arabia- will wait to hear something- explained to pt that  I don't know if there's another tx- at least form my experience other than what's been tried.   2/13- waiting to have pt seen by Neuro- d/w them this AM- they will help narrow down plan.   2/14- started IVIG again x 2 days- waiting for the moment on Rituximab 12: Leukocytosis: likely steroid induced, improving; no fever              -follow-up CBC with differential Thursday 1/30 and q Monday  1/3 WBC elevated but stable at 15,  likely steroid related  2/10- WBC 11.9- doing better on steroids    Latest Ref Rng & Units 11/23/2023    5:40 AM 11/16/2023    5:42 AM 11/12/2023    5:52 AM  CBC  WBC 4.0 - 10.5 K/uL 11.9  15.0  15.0   Hemoglobin 13.0 - 17.0 g/dL 29.5  62.1  30.8   Hematocrit 39.0 - 52.0 % 40.8  40.9  41.5   Platelets 150 - 400  K/uL 347  397  412     13.  Elevated ALT  -2/3 improved to 48 today  2/10- will recheck next week    14. Cold feeling in b/l legs.  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  EXTR warm, pulses are normal, do not think he needs arterial Dopplers.  Likely autonomic, trial calcium channel blocker  2/5- so far, no improvement with Norvasc 5 mg daily-   2/6- pt emphatic that it's the worst part of his pain- he also describes feet feeling wet - but are dry and warmish to touch- will increase Duloxetine to 90 mg daily and sotp Zoloft.   2/7- pt agreed to change- said nerve pain/cold in feet slightly better already- thinks Norvasc made him feel fatigued, so wants to stop   2/9 pt willing to try norvasc again for sx. Feels that it helped   -will start low dose 2.5mg  daily   2/10- will con't for now- pt told me didn't help initially, but we will see  2/11- pt says yesterday cold was SO bad, it was really painful- will con't Amlodipine. 2/12- Will increase Duloxetine to 120 mg daily- is really bothering him. Wait to increase Norvasc today  2/13- no change, but has noticed when  leaves feet down at least 15 minutes, feet get colder 2/14- can increase Norvasc to 5 mg over the weekend 15. Constipation  2/14- Pt reports LBM yesterday   I spent a total of  39  minutes on total care today- >50% coordination of care- due to d/w pt and Neuro about pt's Rituximab     LOS: 17 days A FACE TO FACE EVALUATION WAS PERFORMED  Salaya Holtrop 11/27/2023, 11:26 AM

## 2023-11-27 NOTE — Progress Notes (Signed)
Occupational Therapy Session Note  Patient Details  Name: Jonathan Luna MRN: [id number] Date of Birth: January 01, 1966  Today's Date: 11/27/2023 OT Individual Time: 0805-0900 OT Individual Time Calculation (min): 55 min    Short Term Goals: Week 3:  OT Short Term Goal 1 (Week 3): Pt will complete LB dressing at Min A with AE as necessary OT Short Term Goal 2 (Week 3): Pt will complete toilet transfer at SBA with AD OT Short Term Goal 3 (Week 3): Pt will complete dynamic standing activities with Min A  Skilled Therapeutic Interventions/Progress Updates:  Skilled OT intervention completed with focus on ADL retraining, functional mobility, RUE NMR and self-ROM education. Pt received upright in bed, agreeable to session. Positional discomfort in hamstrings and lower back pain reported; nurse notified of pain med request. OT offered rest breaks and repositioning throughout for pain reduction.  Pt reported need for BM. Transitioned to EOB with mod I. Needed max-total A for donning of socks and bilateral AFOs/tennis shoes. From slightly elevated bed, pt stood CGA using RW, then ambulated with CGA > toilet with verbal cues needed for technique over bathroom threshold. + time needed for managing LB clothing during toileting due to bilateral wrist drop and functional grasp impairments but used pockets of shorts to "fist" pants down on either side with CGA/min A needed for balance and then cues to back up towards toilet. Pt continent of urinary void/BM. Used hook method via pockets to donn shorts over hips with min A. Ambulated with CGA/min A > w/c. Set up A for hand hygiene.  Transported dependently in w/c <> gym. OT applied saebo to RUE wrist extensors with the following parameters: Saebo Stim One 330 pulse width 35 Hz pulse rate On 8 sec/ off 8 sec Ramp up/ down 2 sec Symmetrical Biphasic wave form  Max intensity at 500 Ohm load -Removed after 20 mins with skin intact and without  irritation  Assist provided for full wrist/digit extension as pt with demo of restrictions due to muscular tension/contractures in bilateral forearms. Demo provided on self-ROM for wrist/digit extension and forearm supination with forearm assist, small 2 lb dumbbell and edge of table method. Pt completed bilaterally with supervision, however assist needed for control of dumbbell during supination. Handouts issued for saebo device online that pt can purchased and alternate between hands during day, as pt said he really enjoys it. However advised that pt work on positioning with splinting, and stretching as well to increase overall ROM while the nerve is regenerating.     Pt remained seated in w/c, with chair alarm on/activated, and with all needs in reach at end of session.   Therapy Documentation Precautions:  Precautions Precautions: Fall Restrictions Weight Bearing Restrictions Per Provider Order: No    Therapy/Group: Individual Therapy  Perez Dirico E Abdulhamid Olgin, MS, OTR/L  11/27/2023, 9:30 AM

## 2023-11-28 NOTE — Progress Notes (Signed)
PROGRESS NOTE   Subjective/Complaints:  Chest CT yesterday with resolution of right upper lobe changes with minimal scarring, without nodule or mass, a few scattered calcified granulomas.  QuantiFERON gold plus is still pending.  Per neurology, holding off on rituximab until QuantiFERON results.  Overnight, reporting 8 out of 10 low back pain.  On exam, patient is comfortable laying in bed, no complaints.  Does state that his feet get very cold when ambulating Reprexain the floor, but today in bed they feel fine.  Vital stable.  Respiratory rate oxygen saturation stable. ROS:   Pt denies SOB, abd pain, CP, N/V/C/D, and vision changes Cold feet (+) -only when out of bed  Objective:   CT CHEST WO CONTRAST Result Date: 11/27/2023 CLINICAL DATA:  Follow-up right upper lobe infiltrate EXAM: CT CHEST WITHOUT CONTRAST TECHNIQUE: Multidetector CT imaging of the chest was performed following the standard protocol without IV contrast. RADIATION DOSE REDUCTION: This exam was performed according to the departmental dose-optimization program which includes automated exposure control, adjustment of the mA and/or kV according to patient size and/or use of iterative reconstruction technique. COMPARISON:  CT from 09/30/2023 and 08/28/2023. FINDINGS: Cardiovascular: Thoracic aorta shows no aneurysmal dilatation. No significant atherosclerotic calcifications are seen. Mild coronary calcifications are noted. No cardiomegaly is seen. Mediastinum/Nodes: Thoracic inlet is within normal limits. No hilar or mediastinal adenopathy is noted. The esophagus as visualized is within normal limits. Lungs/Pleura: The lungs are well aerated bilaterally. No focal confluent infiltrate or sizable effusion is seen. The previously noted changes in the right upper lobe posteriorly have continue to improved with only minimal residual scarring identified. No focal nodularity is seen.  A few scattered calcified granulomas are noted. Upper Abdomen: Visualized upper abdomen is within normal limits. Musculoskeletal: No chest wall mass or suspicious bone lesions identified. IMPRESSION: Continued resolution of the right upper lobe image is seen on prior examinations. Only minimal residual scarring remains. No focal nodule or mass is seen. No other focal abnormality is noted. Electronically Signed   By: Alcide Clever M.D.   On: 11/27/2023 21:26   No results for input(s): "WBC", "HGB", "HCT", "PLT" in the last 72 hours.    No results for input(s): "NA", "K", "CL", "CO2", "GLUCOSE", "BUN", "CREATININE", "CALCIUM" in the last 72 hours.    Intake/Output Summary (Last 24 hours) at 11/28/2023 1029 Last data filed at 11/28/2023 0754 Gross per 24 hour  Intake 1080 ml  Output 600 ml  Net 480 ml        Physical Exam: Vital Signs Blood pressure 124/83, pulse 77, temperature (!) 97.5 F (36.4 C), resp. rate 18, height 5\' 7"  (1.702 m), weight 82.5 kg, SpO2 99%.   General: awake, alert, appropriate, laying in bed.  No acute distress. HENT: conjugate gaze; oropharynx moist.  Glasses donned. CV: regular rate and rhythm; no JVD Pulmonary: CTA B/L; no W/R/R- good air movement GI: soft, NT, ND, (+)BS Psychiatric: appropriate, mildly anxious Neurological: Ox3  Extremities:-Well-perfused appearance, no coolness to touch   Biceps and triceps 4+/5; WE 0/5; Grip 2/5 and on R FA 0/5 and on L 1/5 - rechecked again today RLE- HF 4 to 4+/5 KE 4/5; DF  0/5; PF 3+/5 LLE- HF 4 to 4+/5; KE 4/5; DF 0/5 and PF 3+/5 Extremities:- no LE swelling in knees or ankles B/L  Neuro:  Decreased to light touch from elbows to finger tips B/L. L>R  Unchanged 2-15  Assessment/Plan: 1. Functional deficits which require 3+ hours per day of interdisciplinary therapy in a comprehensive inpatient rehab setting. Physiatrist is providing close team supervision and 24 hour management of active medical problems listed  below. Physiatrist and rehab team continue to assess barriers to discharge/monitor patient progress toward functional and medical goals  Care Tool:  Bathing    Body parts bathed by patient: Right arm, Left lower leg, Face, Left arm, Chest, Abdomen, Front perineal area, Buttocks, Right upper leg, Left upper leg, Right lower leg         Bathing assist Assist Level: Supervision/Verbal cueing     Upper Body Dressing/Undressing Upper body dressing   What is the patient wearing?: Pull over shirt    Upper body assist Assist Level: Moderate Assistance - Patient 50 - 74%    Lower Body Dressing/Undressing Lower body dressing      What is the patient wearing?: Underwear/pull up, Pants     Lower body assist Assist for lower body dressing: Maximal Assistance - Patient 25 - 49%     Toileting Toileting    Toileting assist Assist for toileting: Moderate Assistance - Patient 50 - 74%     Transfers Chair/bed transfer  Transfers assist     Chair/bed transfer assist level: Contact Guard/Touching assist Chair/bed transfer assistive device: Walker, Archivist   Ambulation assist      Assist level: Contact Guard/Touching assist Assistive device: Walker-rolling Max distance: 175   Walk 10 feet activity   Assist     Assist level: Contact Guard/Touching assist Assistive device: Walker-rolling   Walk 50 feet activity   Assist    Assist level: Contact Guard/Touching assist Assistive device: Walker-rolling    Walk 150 feet activity   Assist Walk 150 feet activity did not occur:  (fatigue and imbalance)  Assist level: Contact Guard/Touching assist Assistive device: Walker-rolling    Walk 10 feet on uneven surface  activity   Assist Walk 10 feet on uneven surfaces activity did not occur: Safety/medical concerns (did not attempt d/t pt fatigue)         Wheelchair     Assist Is the patient using a wheelchair?: Yes Type of  Wheelchair: Manual Wheelchair activity did not occur: Safety/medical concerns  Wheelchair assist level: Supervision/Verbal cueing Max wheelchair distance: 150    Wheelchair 50 feet with 2 turns activity    Assist    Wheelchair 50 feet with 2 turns activity did not occur:  (fatigue and imbalance)   Assist Level: Supervision/Verbal cueing   Wheelchair 150 feet activity     Assist  Wheelchair 150 feet activity did not occur:  (fatigue and imbalance)   Assist Level: Supervision/Verbal cueing   Blood pressure 124/83, pulse 77, temperature (!) 97.5 F (36.4 C), resp. rate 18, height 5\' 7"  (1.702 m), weight 82.5 kg, SpO2 99%.  Medical Problem List and Plan: 1. 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  Neurology holding rituximab until QuantiFERON gold results.   2.  Antithrombotics: -DVT/anticoagulation:  Pharmaceutical: Lovenox             -antiplatelet therapy: aspirin 81 mg daily   3. Pain Management: Tylenol as needed             -continue gabapentin 900 mg TID and 900 mg daily PRN             -continue Percocet 5/325 q 4 hours PRN  -Continue Cymbalta 60 mg daily   -Consider trial of Lyrica to replace gabapentin   2/6- will increase Duloxetine to 90 mg daily and stop Zoloft so can do so.   2/9 pain improving. Continue plan above  2/12- pt reports still a major issue- but will increase Duloxetine to 120 mg daily and monitor  2/14- no improvement so far, but just started yesterday with  increased dose- 2-15: Ongoing severe back pain overnight.  Is using Percocet consistently every 4 hours, has not used Tylenol since 2-1.  Did use as needed gabapentin overnight, with benefit.  4. Mood/Behavior/Sleep: LCSW to evaluate and provide emotional support             -continue Cymbalta 60 mg daily             -continue Zoloft 25 mg daily             -continue clonazepam 0.5 mg BID prn             -antipsychotic agents: n/a  -Patient requested increase of clonazepam due to anxiety.  He takes this medication chronically PDMP reviewed, will increase temporarily to 1 mg twice daily as needed  -1/31 pt reports anxiety is improved   2/4- pt reports anxiety is better due to Prednisone same with irritability.   2/10- appears more anxious since recovery not going as expected  2/13 - Anxiety getting worse as day of d/c gets closer  5. Neuropsych/cognition: This patient is capable of making decisions on his own behalf.   6. Skin/Wound Care: Routine skin care checks   7. Fluids/Electrolytes/Nutrition: Routine Is and Os and follow-up chemistries   8: Hypertension: monitor TID and prn             -continue Avapro 300 mg daily             -continue Toprol-XL 25 mg daily  -1/31 controlled, continue current      11/28/2023    8:37 AM 11/28/2023    8:35 AM 11/28/2023    4:29 AM  Vitals with BMI  Systolic 124 124 161  Diastolic 83 83 85  Pulse  77 69  2/1 Will reduce losartan to 75 mg, start amlodipine 5 mg and monitor- see no. 14 BP controlled will try on amlodipine alone 2/3 BP controlled continue current regimen   2/4- BP a little elevated DBP- at 90, but otherwise well controlled- con't regimen  2/9 begin norvasc 2.5mg  daily for feet/hands per below  2/10- will try it again, but wasn't working when was on it before  2/11- no change so far, but will   2/12- BP usually controlled,  however will wait on increase of Norvasc for foot coldness  2/13- slightly elevated this AM, however was  soft last evening- won't make any changes yet  2/14- BP controlled- but can increase Norvasc to 5 mg over weekend if need be for foot coldness  2-15: Blood pressure well-controlled.  Continue current regimen.   9: Hypothyroidism: continue Synthroid 125 mcg daily   10: Tobacco use: cessation counseling -Nicorette gum as needed (not using)             -? 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 end date?) -follow-up Dr. Terrace Arabia -1/30 respiratory function- NIF >-40 and IS 2500, stable. Continue to monitor -2/3 NIF/IS stable   2/4- NIF's VC OK- on ICS actually- spoke with Dr Terrace Arabia- she wants [name] to do another dosing of IVIG over a total of 4 days- 2g/kg total dose over 4 days- will arrange with Pharmacy- spoke with them as well- will also increase Prednisone to 60 mg daily- since I think it was decreased- supposed to be on until f/u with Dr Terrace Arabia- needs BMP daily per pharmacy while on IVIG.   2/11- will reach out to to Dr Terrace Arabia since strength not too much better after IVIG-    2/12- haven't heard back from Dr Terrace Arabia- will wait to hear something- explained to pt that I don't know if there's another tx- at least form my experience other than what's been tried.   2/13- waiting to have pt seen by Neuro- d/w them this AM- they will help narrow down plan.   2/14- started IVIG again x 2 days- waiting for the moment on Rituximab--per note waiting for quantiferon results 2/15  12: Leukocytosis: likely steroid induced, improving; no fever              -follow-up CBC with differential Thursday 1/30 and q Monday  1/3 WBC elevated but stable at 15,  likely steroid related  2/10- WBC 11.9- doing better on steroids    Latest Ref Rng & Units 11/23/2023    5:40 AM 11/16/2023    5:42 AM 11/12/2023    5:52 AM  CBC  WBC 4.0 - 10.5 K/uL 11.9  15.0  15.0   Hemoglobin 13.0 - 17.0 g/dL 16.1  09.6  04.5   Hematocrit 39.0 - 52.0 % 40.8  40.9  41.5   Platelets 150 - 400  K/uL 347  397  412     13.  Elevated ALT  -2/3 improved to 48 today  2/10- will recheck next week    14. Cold feeling in b/l legs.  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  EXTR warm, pulses are normal, do not think he needs arterial Dopplers.  Likely autonomic, trial calcium channel blocker  2/5- so far, no improvement with Norvasc 5 mg daily-   2/6- pt emphatic that it's the worst part of his pain- he also describes feet feeling wet - but are dry and warmish to touch- will increase Duloxetine to 90 mg daily and sotp Zoloft.   2/7- pt agreed to change- said nerve pain/cold in feet slightly better already- thinks Norvasc made him feel fatigued, so wants to stop   2/9 pt willing to try norvasc again for sx. Feels that it helped   -will start low dose 2.5mg  daily   2/10- will con't for now- pt told me didn't help initially, but we will see  2/11- pt says yesterday cold was SO bad,  it was really painful- will con't Amlodipine. 2/12- Will increase Duloxetine to 120 mg daily- is really bothering him. Wait to increase Norvasc today  2/13- no change, but has noticed when  leaves feet down at least 15 minutes, feet get colder 2/14- can increase Norvasc to 5 mg over the weekend 2/15: COVID/sensitivity seems dependent on positioning, patient in bed today and not bothered, will start medication for now  15. Constipation  2/14- Pt reports LBM yesterday   LOS: 18 days A FACE TO FACE EVALUATION WAS PERFORMED  [name] 11/28/2023, 10:29 AM

## 2023-11-29 MED ORDER — AMLODIPINE BESYLATE 5 MG PO TABS
5.0000 mg | ORAL_TABLET | Freq: Every day | ORAL | Status: DC
  Administered 2023-11-30 – 2023-12-08 (×9): 5 mg via ORAL
  Filled 2023-11-29 (×9): qty 1

## 2023-11-29 NOTE — Progress Notes (Signed)
Physical Therapy Session Note  Patient Details  Name: Jonathan Luna MRN: [id number] Date of Birth: 06-08-1966  Today's Date: 11/29/2023 PT Individual Time: 0800-0900 PT Individual Time Calculation (min): 60 min   Short Term Goals: Week 3:  PT Short Term Goal 1 (Week 3): Pt will require minA with steps x2 with HR PT Short Term Goal 2 (Week 3): Pt will ambulate 150 feet with LRAD and SBA PT Short Term Goal 3 (Week 3): Pt will maintain dynamic standing balance with SBA and LRAD for duration of therapeutic activity (Wii, connect 4 game, etc). PT Short Term Goal 4 (Week 3): Pt will perform sit<>stand with supervision and LRAD  Skilled Therapeutic Interventions/Progress Updates:      Therapy Documentation Precautions:  Precautions Precautions: Fall Restrictions Weight Bearing Restrictions Per Provider Order: No  Pt agreeable to PT and states frustrated and upset he only has one hour of therapy. PT encouraged pt to take advantage of session and communicated to nursing and OT pt would like to shower later. Supervision with gait 235' RW to main gym with minimal occurrence of right knee hyperextension along with safe techniques to prevent buckling. Pt educated on strength gains (neuromuscular function return vs. Hypertrophy) timeline and encourgaged pt of his progress. Pt performed alternating 6" step taps initially with B UE> 1 HR> no hand rails (min A) 4 x 5. Pt transitioned to gait training, no UE support in parallel bars 2 x 8 and progressed to 35' + 132' min A no UE support, no bucking, and minimal right knee hyperextension. Pt returned to room, left seated at bedside all needs in reach.     Therapy/Group: Individual Therapy  Truitt Leep Truitt Leep PT, DPT  11/29/2023, 12:10 PM

## 2023-11-29 NOTE — Progress Notes (Addendum)
NEUROLOGY CONSULT NOTE   Date of service: November 29, 2023 Patient Name: Jonathan Luna MRN:  [id number] DOB:  24-Jul-1966 Chief Complaint: treatment-refractory CIDP Requesting Provider: Genice Rouge, MD  Interval hx   Feels about 50% improved with IVIG.  Past History   Past Medical History:  Diagnosis Date   Allergy    Anxiety    Arthritis    Cataract    Community acquired pneumonia 08/28/2023   Depression    Detached retina    Heart murmur    History of bilateral cataract extraction    Hyperlipidemia    Hypothyroidism    Low back pain    Panic attack    Panic attacks    Pneumonia of right upper lobe due to infectious organism 08/18/2023    Past Surgical History:  Procedure Laterality Date   CATARACT EXTRACTION     EYE SURGERY     IR FLUORO GUIDE CV LINE RIGHT  10/29/2023   IR US GUIDE VASC ACCESS RIGHT  10/29/2023    Family History: Family History  Problem Relation Age of Onset   Breast cancer Mother    Hypertension Father    Heart disease Father    Asthma Father    Emphysema Father        smoker for 45 years   Diabetes Brother    Hyperlipidemia Brother     Social History  reports that he has never smoked. He has been exposed to tobacco smoke. He has never used smokeless tobacco. He reports that he does not currently use alcohol. He reports that he does not use drugs.  Allergies  Allergen Reactions   Amoxil [Amoxicillin] Other (See Comments)    Pt states that it does not work for him when he had back to back pneumonia   Effexor [Venlafaxine] Diarrhea   Prozac [Fluoxetine Hcl] Diarrhea    Medications   Current Facility-Administered Medications:    acetaminophen (TYLENOL) tablet 325-650 mg, 325-650 mg, Oral, Q4H PRN, Milinda Antis, PA-C, 650 mg at 11/14/23 0827   albuterol (PROVENTIL) (2.5 MG/3ML) 0.083% nebulizer solution 2.5 mg, 2.5 mg, Nebulization, Q4H PRN, Setzer, Lynnell Jude, PA-C   alum & mag hydroxide-simeth (MAALOX/MYLANTA) 200-200-20 MG/5ML  suspension 30 mL, 30 mL, Oral, Q4H PRN, Setzer, Lynnell Jude, PA-C, 30 mL at 11/18/23 1124   amLODipine (NORVASC) tablet 2.5 mg, 2.5 mg, Oral, Daily, Faith Rogue T, MD, 2.5 mg at 11/29/23 7829   aspirin EC tablet 81 mg, 81 mg, Oral, Daily, Milinda Antis, PA-C, 81 mg at 11/29/23 0758   bisacodyl (DULCOLAX) EC tablet 5 mg, 5 mg, Oral, Daily PRN, Setzer, Sandra J, PA-C   calcium carbonate (TUMS - dosed in mg elemental calcium) chewable tablet 400 mg of elemental calcium, 400 mg of elemental calcium, Oral, QID PRN, Milinda Antis, PA-C, 400 mg of elemental calcium at 11/26/23 2308   clonazePAM (KLONOPIN) tablet 1 mg, 1 mg, Oral, BID PRN, Fanny Dance, MD, 1 mg at 11/28/23 2109   dextromethorphan-guaiFENesin (MUCINEX DM) 30-600 MG per 12 hr tablet 1 tablet, 1 tablet, Oral, BID, Setzer, Lynnell Jude, PA-C, 1 tablet at 11/29/23 0759   diphenhydrAMINE (BENADRYL) capsule 25 mg, 25 mg, Oral, Q6H PRN, Milinda Antis, PA-C, 25 mg at 11/27/23 2009   DULoxetine (CYMBALTA) DR capsule 120 mg, 120 mg, Oral, Daily, Lovorn, Megan, MD, 120 mg at 11/29/23 0758   enoxaparin (LOVENOX) injection 40 mg, 40 mg, Subcutaneous, Q24H, Setzer, Lynnell Jude, PA-C, 40 mg at 11/28/23 1750  gabapentin (NEURONTIN) capsule 900 mg, 900 mg, Oral, TID, Setzer, Lynnell Jude, PA-C, 900 mg at 11/29/23 1610   gabapentin (NEURONTIN) capsule 900 mg, 900 mg, Oral, Daily PRN, Milinda Antis, PA-C, 900 mg at 11/29/23 1219   levothyroxine (SYNTHROID) tablet 125 mcg, 125 mcg, Oral, Q0600, Milinda Antis, PA-C, 125 mcg at 11/29/23 0631   metoprolol succinate (TOPROL-XL) 24 hr tablet 25 mg, 25 mg, Oral, Daily, Setzer, Lynnell Jude, PA-C, 25 mg at 11/29/23 9604   nicotine polacrilex (NICORETTE) gum 2 mg, 2 mg, Oral, PRN, Milinda Antis, PA-C   ondansetron [facility]) tablet 4 mg, 4 mg, Oral, Q6H PRN, 4 mg at 11/19/23 1401 **OR** ondansetron (ZOFRAN) injection 4 mg, 4 mg, Intravenous, Q6H PRN, Setzer, Sandra J, PA-C   oxyCODONE-acetaminophen  (PERCOCET/ROXICET) 5-325 MG per tablet 1 tablet, 1 tablet, Oral, Q4H PRN, Setzer, Lynnell Jude, PA-C, 1 tablet at 11/29/23 1214   polyethylene glycol (MIRALAX / GLYCOLAX) packet 17 g, 17 g, Oral, Daily PRN, Setzer, Sandra J, PA-C   predniSONE (DELTASONE) tablet 60 mg, 60 mg, Oral, Q breakfast, Lovorn, Megan, MD, 60 mg at 11/29/23 0759   sodium phosphate (FLEET) enema 1 enema, 1 enema, Rectal, Once PRN, Milinda Antis, PA-C  Vitals   Vitals:   11/28/23 0837 11/28/23 1413 11/28/23 1955 11/29/23 0352  BP: 124/83 120/85 117/75 (!) 134/93  Pulse:  84 75 72  Resp:  18 18 18   Temp:  98.5 F (36.9 C) (!) 97.4 F (36.3 C) 97.6 F (36.4 C)  TempSrc:      SpO2:  90% 94% 98%  Weight:      Height:        Body mass index is 28.49 kg/m.  Physical Exam   Constitutional: Appears well-developed and well-nourished.  Laying in bed. Psych: Affect appropriate to situation.  Eyes: No scleral injection.  Evidence of past irodotomy  HENT: No OP obstruction.  Head: Normocephalic.  Cardiovascular: Normal rate and regular rhythm.  Respiratory: Effort normal, non-labored breathing.  GI: Soft.  No distension. There is no tenderness.  Skin: WDI.   Neurologic Examination   MENTAL STATUS: AAOx3, attention intact evidence by spelling forward/backwards and per conversation. fund of knowledge appropriate.   LANG/SPEECH: Naming and repetition intact, fluent, follows 3-step commands. No dysarthria with complex phrases.   CRANIAL NERVES: II: Pupils equal and reactive, no RAPD, no visual field deficits, appropriate to past retinal detachment.  III, IV, VI: EOM intact, no gaze preference or deviation, no nystagmus. V: normal sensation in V1, V2, and V3 segments bilaterally VII: no asymmetry, no nasolabial fold flattening VIII: normal hearing to speech IX, X: normal palatal elevation, no uvular deviation XI: 5/5 head turn and 5/5 shoulder shrug bilaterally XII: midline tongue protrusion  MOTOR: Muscle bulk:  Borderline normal, tone slightly decreased, tremor with movement Mvmt Root Nerve  Muscle Right Left Comments  SA C5/6 Ax Deltoid 5 5   EF C5/6 Mc Biceps 4+ 4+   EE C6/7/8 Rad Triceps 5 5   WF C6/7 Med FCR 3 3   WE C7/8 PIN ECU 1 1   F Ab C8/T1 U ADM/FDI     HF L1/2/3 Fem Illopsoas 4+ 4+   KE L2/3/4 Fem Quad 4+ 4+   DF L4/5 D Peron Tib Ant 3 3   PF S1/2 Tibial Grc/Sol 4 4     REFLEXES: 2/4 throughout, bilateral flexor plantar response  SENSORY: Cannot feel temperature below knee bilaterally.  Vibration decreased on knees and hands  bilaterally.  Sensation intact to light touch throughout No hemineglect, no extinction to double sided stimulation (visual & tactile)  COORD: Dyssynergia with upper extremity strength testing (L>R). Tremors with exertions of bilateral upper extremities.  STATION: Deferred due to weakness GAIT: Deferred due to weakness   Labs/Imaging/Neurodiagnostic studies   CBC:  Recent Labs  Lab November 30, 2023 0540  WBC 11.9*  HGB 13.5  HCT 40.8  MCV 91.5  PLT 347   Basic Metabolic Panel:  Lab Results  Component Value Date   NA 142 11/30/23   K 3.6 30-Nov-2023   CO2 25 11-30-23   GLUCOSE 77 11/30/23   BUN 10 November 30, 2023   CREATININE 0.81 11-30-23   CALCIUM 9.7 2023/11/30   GFRNONAA >60 11-30-2023   GFRAA 94 08/16/2008   Lipid Panel:  Lab Results  Component Value Date   LDLCALC 106 (H) 10/08/2023   HgbA1c:  Lab Results  Component Value Date   HGBA1C 5.3 09/22/2023   Urine Drug Screen:     Component Value Date/Time   LABOPIA NEG 09/29/2012 0830   COCAINSCRNUR Negative 08/18/2023 1114   COCAINSCRNUR NEG 09/29/2012 0830   LABBENZ POS (A) 09/29/2012 0830   AMPHETMU NEG 09/29/2012 0830     INR  Lab Results  Component Value Date   INR 1.0 08/29/2023   APTT  Lab Results  Component Value Date   APTT 32 08/29/2023   IgG 1/16: 1206 IgA 1/16: 582 TSH, T4: Within normal limits Acute hepatitis panel: Negative QuantiFERON:  Pending Paraneoplastic panel: Pending  CT Head without contrast(Personally reviewed) 10/25/2023: No acute intracranial findings.  MRI brain without contrast (Personally reviewed) 09/30/2024: Normal brain MRI.   CT Chest w/o C: Continued resolution of the right upper lobe image is seen on prior examinations. Only minimal residual scarring remains. No focal nodule or mass is seen.  ASSESSMENT   Jonathan Luna is a 58 y.o. 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  He improved about 50% after first IVIG treatment but then declined again. He underwent PLEX with no significant improvement and then another round of IVIG in rehab with slight 10-20% improvement. He is on steroid but feels that it has not helped. Discussed with Dr. Terrace Arabia with Neuromuscular team and concern for treatment refractory CIDP.   Initially planning on Rituximab. However, he is improved with IVIG over the last 2 days.  RECOMMENDATIONS  Maintenance IVIG 1G/Kg (over 2 days) every week. Continue prednisone 60 mg once daily Quantiferon gold plus is still pending. Hold off on Rituximab given significant improvement with IVIG.  ___________________________________________________________________   Erick Blinks, MD [facility] [phone]   If 7pm to 7am, please call on call as listed on AMION.

## 2023-11-29 NOTE — Progress Notes (Signed)
PROGRESS NOTE   Subjective/Complaints:  QuantiFERON gold plus is still pending.  Discussed with neurology this a.m., no plans to pursue rituximab.  No acute complaints.  No events overnight.  Patient seen while brushing his teeth in the sink, feeling much better.  Still having some coldness in his feet when he is up and moving, but does feel like this is somewhat improved.  Vital stable.  Respiratory rate oxygen saturation stable.  ROS:   Pt denies SOB, abd pain, CP, N/V/C/D, and vision changes Cold feet (+) -only when out of bed  Objective:   CT CHEST WO CONTRAST Result Date: 11/27/2023 CLINICAL DATA:  Follow-up right upper lobe infiltrate EXAM: CT CHEST WITHOUT CONTRAST TECHNIQUE: Multidetector CT imaging of the chest was performed following the standard protocol without IV contrast. RADIATION DOSE REDUCTION: This exam was performed according to the departmental dose-optimization program which includes automated exposure control, adjustment of the mA and/or kV according to patient size and/or use of iterative reconstruction technique. COMPARISON:  CT from 09/30/2023 and 08/28/2023. FINDINGS: Cardiovascular: Thoracic aorta shows no aneurysmal dilatation. No significant atherosclerotic calcifications are seen. Mild coronary calcifications are noted. No cardiomegaly is seen. Mediastinum/Nodes: Thoracic inlet is within normal limits. No hilar or mediastinal adenopathy is noted. The esophagus as visualized is within normal limits. Lungs/Pleura: The lungs are well aerated bilaterally. No focal confluent infiltrate or sizable effusion is seen. The previously noted changes in the right upper lobe posteriorly have continue to improved with only minimal residual scarring identified. No focal nodularity is seen. A few scattered calcified granulomas are noted. Upper Abdomen: Visualized upper abdomen is within normal limits. Musculoskeletal: No chest  wall mass or suspicious bone lesions identified. IMPRESSION: Continued resolution of the right upper lobe image is seen on prior examinations. Only minimal residual scarring remains. No focal nodule or mass is seen. No other focal abnormality is noted. Electronically Signed   By: Alcide Clever M.D.   On: 11/27/2023 21:26   No results for input(s): "WBC", "HGB", "HCT", "PLT" in the last 72 hours.    No results for input(s): "NA", "K", "CL", "CO2", "GLUCOSE", "BUN", "CREATININE", "CALCIUM" in the last 72 hours.    Intake/Output Summary (Last 24 hours) at 11/29/2023 0934 Last data filed at 11/29/2023 0803 Gross per 24 hour  Intake 712 ml  Output 2800 ml  Net -2088 ml        Physical Exam: Vital Signs Blood pressure (!) 134/93, pulse 72, temperature 97.6 F (36.4 C), resp. rate 18, height 5\' 7"  (1.702 m), weight 82.5 kg, SpO2 98%.   General: awake, alert, appropriate, laying in bed.  No acute distress.  Sitting up in wheelchair. HENT: conjugate gaze; oropharynx moist.  Glasses donned. CV: regular rate and rhythm; no JVD.  Mildly delayed capillary refill in bilateral feet Pulmonary: CTA B/L; no W/R/R- good air movement GI: soft, NT, ND, (+)BS Psychiatric: appropriate, mildly anxious Neurological: Ox3  Extremities:-Well-perfused appearance, cool to the touch in bilateral feet Biceps, triceps, and shoulder abduction 4+/5; WE 0/5; Grip 3/5 and on R FA 1/5 and on L 1/5 RLE- HF 4 to 4+/5 KE 4/5; DF 0/5; PF 3+/5 LLE- HF 4 to 4+/5;  KE 4/5; DF 0/5 and PF 3+/5 Extremities:- no LE swelling in knees or ankles B/L  sensation:  Decreased to light touch from elbows to finger tips B/L. L>R    Assessment/Plan: 1. Functional deficits which require 3+ hours per day of interdisciplinary therapy in a comprehensive inpatient rehab setting. Physiatrist is providing close team supervision and 24 hour management of active medical problems listed below. Physiatrist and rehab team continue to assess  barriers to discharge/monitor patient progress toward functional and medical goals  Care Tool:  Bathing    Body parts bathed by patient: Right arm, Left lower leg, Face, Left arm, Chest, Abdomen, Front perineal area, Buttocks, Right upper leg, Left upper leg, Right lower leg         Bathing assist Assist Level: Supervision/Verbal cueing     Upper Body Dressing/Undressing Upper body dressing   What is the patient wearing?: Pull over shirt    Upper body assist Assist Level: Moderate Assistance - Patient 50 - 74%    Lower Body Dressing/Undressing Lower body dressing      What is the patient wearing?: Underwear/pull up, Pants     Lower body assist Assist for lower body dressing: Maximal Assistance - Patient 25 - 49%     Toileting Toileting    Toileting assist Assist for toileting: Moderate Assistance - Patient 50 - 74%     Transfers Chair/bed transfer  Transfers assist     Chair/bed transfer assist level: Contact Guard/Touching assist Chair/bed transfer assistive device: Walker, Archivist   Ambulation assist      Assist level: Contact Guard/Touching assist Assistive device: Walker-rolling Max distance: 175   Walk 10 feet activity   Assist     Assist level: Contact Guard/Touching assist Assistive device: Walker-rolling   Walk 50 feet activity   Assist    Assist level: Contact Guard/Touching assist Assistive device: Walker-rolling    Walk 150 feet activity   Assist Walk 150 feet activity did not occur:  (fatigue and imbalance)  Assist level: Contact Guard/Touching assist Assistive device: Walker-rolling    Walk 10 feet on uneven surface  activity   Assist Walk 10 feet on uneven surfaces activity did not occur: Safety/medical concerns (did not attempt d/t pt fatigue)         Wheelchair     Assist Is the patient using a wheelchair?: Yes Type of Wheelchair: Manual Wheelchair activity did not occur:  Safety/medical concerns  Wheelchair assist level: Supervision/Verbal cueing Max wheelchair distance: 150    Wheelchair 50 feet with 2 turns activity    Assist    Wheelchair 50 feet with 2 turns activity did not occur:  (fatigue and imbalance)   Assist Level: Supervision/Verbal cueing   Wheelchair 150 feet activity     Assist  Wheelchair 150 feet activity did not occur:  (fatigue and imbalance)   Assist Level: Supervision/Verbal cueing   Blood pressure (!) 134/93, pulse 72, temperature 97.6 F (36.4 C), resp. rate 18, height 5\' 7"  (1.702 m), weight 82.5 kg, SpO2 98%.  Medical Problem List and Plan: 1. 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             -will be on Prednisone 60 mg daily until f/u with Neurology outpatient             -patient may  shower             -ELOS/Goals: 2-3 weeks- supervision to mod i  -d/c 2/25  Con't CIR  PT and OT  Thank you Neuro for seeing pt- a thorough w/u and working on determining if can have Rituximab- and determining if cause of Neoplastic cause that could be triggering CIDP   See #11 below   2.  Antithrombotics: -DVT/anticoagulation:  Pharmaceutical: Lovenox             -antiplatelet therapy: aspirin 81 mg daily   3. Pain Management: Tylenol as needed             -continue gabapentin 900 mg TID and 900 mg daily PRN             -continue Percocet 5/325 q 4 hours PRN  -Continue Cymbalta 60 mg daily   -Consider trial of Lyrica to replace gabapentin   2/6- will increase Duloxetine to 90 mg daily and stop Zoloft so can do so.   2/9 pain improving. Continue plan above  2/12- pt reports still a major issue- but will increase Duloxetine to 120 mg daily and monitor  2/14- no improvement so far, but just started yesterday with increased dose- 2-15: Ongoing severe back pain overnight.  Is using Percocet consistently every 4 hours, has not used Tylenol since 2-1.  Did use as needed gabapentin overnight, with benefit.  4.  Mood/Behavior/Sleep: LCSW to evaluate and provide emotional support             -continue Cymbalta 60 mg daily             -continue Zoloft 25 mg daily             -continue clonazepam 0.5 mg BID prn             -antipsychotic agents: n/a  -Patient requested increase of clonazepam due to anxiety.  He takes this medication chronically PDMP reviewed, will increase temporarily to 1 mg twice daily as needed  -1/31 pt reports anxiety is improved   2/4- pt reports anxiety is better due to Prednisone same with irritability.   2/10- appears more anxious since recovery not going as expected  2/13 - Anxiety getting worse as day of d/c gets closer  5. Neuropsych/cognition: This patient is capable of making decisions on his own behalf.   6. Skin/Wound Care: Routine skin care checks   7. Fluids/Electrolytes/Nutrition: Routine Is and Os and follow-up chemistries   8: Hypertension: monitor TID and prn             -continue Avapro 300 mg daily             -continue Toprol-XL 25 mg daily  -1/31 controlled, continue current      11/29/2023    3:52 AM 11/28/2023    7:55 PM 11/28/2023    2:13 PM  Vitals with BMI  Systolic 134 117 829  Diastolic 93 75 85  Pulse 72 75 84  2/1 Will reduce losartan to 75 mg, start amlodipine 5 mg and monitor- see no. 14 BP controlled will try on amlodipine alone 2/3 BP controlled continue current regimen   2/4- BP a little elevated DBP- at 90, but otherwise well controlled- con't regimen  2/9 begin norvasc 2.5mg  daily for feet/hands per below  2/10- will try it again, but wasn't working when was on it before  2/11- no change so far, but will   2/12- BP usually controlled, however will wait on increase of Norvasc for foot coldness  2/13- slightly elevated this AM, however was soft last evening- won't make any changes yet  2/14- BP  controlled- but can increase Norvasc to 5 mg over weekend if need be for foot coldness  2-15-2/16: Blood pressure well-controlled.  Continue  current regimen.   9: Hypothyroidism: continue Synthroid 125 mcg daily   10: Tobacco use: cessation counseling -Nicorette gum as needed (not using)             -? 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 end date?) -follow-up Dr. Terrace Arabia -1/30 respiratory function- NIF >-40 and IS 2500, stable. Continue to monitor -2/3 NIF/IS stable   2/4- NIF's VC OK- on ICS actually- spoke with Dr Terrace Arabia- she wants [name] to do another dosing of IVIG over a total of 4 days- 2g/kg total dose over 4 days- will arrange with Pharmacy- spoke with them as well- will also increase Prednisone to 60 mg daily- since I think it was decreased- supposed to be on until f/u with Dr Terrace Arabia- needs BMP daily per pharmacy while on IVIG.   2/11- will reach out to to Dr Terrace Arabia since strength not too much better after IVIG-    2/12- haven't heard back from Dr Terrace Arabia- will wait to hear something- explained to pt that I don't know if there's another tx- at least form my experience other than what's been tried.   2/13- waiting to have pt seen by Neuro- d/w them this AM- they will help narrow down plan.   2/14- started IVIG again x 2 days- waiting for the moment on Rituximab - 2-15: CT with resolving right upper lobe scarring, no other concerning masses for neoplastic process.  Neurology holding rituximab until QuantiFERON gold results. Unable to do Paraneoplastic panel as unreliable after IVIG.  --2/16 QuantiFERON still pending, per neurology patient is feeling 50% better after IVIG; holding off on paraneoplastic panel and rituximab  12: Leukocytosis: likely steroid induced, improving; no fever              -follow-up CBC with differential Thursday 1/30 and q Monday  1/3 WBC elevated but stable at 15,  likely steroid related  2/10- WBC 11.9- doing better on steroids    Latest Ref Rng & Units 11/23/2023    5:40 AM 11/16/2023    5:42 AM 11/12/2023    5:52 AM  CBC  WBC 4.0 - 10.5 K/uL 11.9   15.0  15.0   Hemoglobin 13.0 - 17.0 g/dL 16.1  09.6  04.5   Hematocrit 39.0 - 52.0 % 40.8  40.9  41.5   Platelets 150 - 400 K/uL 347  397  412     13.  Elevated ALT  -2/3 improved to 48 today  2/10- will recheck next week    14. Cold feeling in b/l legs.  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  EXTR warm, pulses are normal, do not think he needs arterial Dopplers.  Likely autonomic, trial calcium channel blocker  2/5- so far, no improvement with Norvasc 5 mg daily-   2/6- pt emphatic that it's the worst part of his pain- he also describes feet feeling wet - but are dry and warmish to touch- will increase Duloxetine to 90 mg daily and sotp Zoloft.   2/7- pt agreed to change- said nerve pain/cold in feet slightly better already- thinks Norvasc made him feel fatigued, so wants to stop   2/9 pt willing to try norvasc again for sx. Feels that it helped   -will start low dose 2.5mg  daily   2/10- will con't for now-  pt told me didn't help initially, but we will see  2/11- pt says yesterday cold was SO bad, it was really painful- will con't Amlodipine. 2/12- Will increase Duloxetine to 120 mg daily- is really bothering him. Wait to increase Norvasc today  2/13- no change, but has noticed when  leaves feet down at least 15 minutes, feet get colder 2/14- can increase Norvasc to 5 mg over the weekend 2/15: Cold sensitivity seems dependent on positioning, patient in bed today and not bothered, will hold further medication for now 2/16: Increase Norvasc for sensitivity to 5 mg daily  15. Constipation  2/15 LBM, large   LOS: 19 days A FACE TO FACE EVALUATION WAS PERFORMED  [name] 11/29/2023, 9:34 AM

## 2023-11-30 LAB — COMPREHENSIVE METABOLIC PANEL
ALT: 41 U/L (ref 0–44)
AST: 36 U/L (ref 15–41)
Albumin: 3.1 g/dL — ABNORMAL LOW (ref 3.5–5.0)
Alkaline Phosphatase: 29 U/L — ABNORMAL LOW (ref 38–126)
Anion gap: 10 (ref 5–15)
BUN: 11 mg/dL (ref 6–20)
CO2: 28 mmol/L (ref 22–32)
Calcium: 9.3 mg/dL (ref 8.9–10.3)
Chloride: 100 mmol/L (ref 98–111)
Creatinine, Ser: 0.75 mg/dL (ref 0.61–1.24)
GFR, Estimated: >60 mL/min (ref >60)
Glucose, Bld: 81 mg/dL (ref 70–99)
Potassium: 3.9 mmol/L (ref 3.5–5.1)
Sodium: 138 mmol/L (ref 135–145)
Total Bilirubin: 1.1 mg/dL (ref 0.0–1.2)
Total Protein: 7.4 g/dL (ref 6.5–8.1)

## 2023-11-30 LAB — CBC
HCT: 41.8 % (ref 39.0–52.0)
Hemoglobin: 13.8 g/dL (ref 13.0–17.0)
MCH: 30.3 pg (ref 26.0–34.0)
MCHC: 33 g/dL (ref 30.0–36.0)
MCV: 91.7 fL (ref 80.0–100.0)
Platelets: 310 10*3/uL (ref 150–400)
RBC: 4.56 MIL/uL (ref 4.22–5.81)
RDW: 13.2 % (ref 11.5–15.5)
WBC: 11.2 10*3/uL — ABNORMAL HIGH (ref 4.0–10.5)
nRBC: 0 % (ref 0.0–0.2)

## 2023-11-30 NOTE — Progress Notes (Signed)
Occupational Therapy Session Note  Patient Details  Name: Jonathan Luna MRN: [id number] Date of Birth: Dec 21, 1965  Today's Date: 11/30/2023 OT Individual Time: 1010-1045 & 1425-1530  OT Individual Time Calculation (min): 35 min & 75 min   Short Term Goals: Week 3:  OT Short Term Goal 1 (Week 3): Pt will complete LB dressing at Min A with AE as necessary OT Short Term Goal 2 (Week 3): Pt will complete toilet transfer at SBA with AD OT Short Term Goal 3 (Week 3): Pt will complete dynamic standing activities with Min A  Skilled Therapeutic Interventions/Progress Updates:      Therapy Documentation Precautions:  Precautions Precautions: Fall Restrictions Weight Bearing Restrictions Per Provider Order: No Session 1 General: "Thank you for this!" Pt seated in W/C upon OT arrival, agreeable to OT.  Pain: unrated tightness reported in B calves, activity, intermittent rest breaks, distractions provided for pain management, pt reports tolerable to proceed.   ADL: Pt trialing adaptive equipment for opening cans d/t decreased FMC. Pt trialed various adaptive equipment items, all resulting with increased independence. Pt mod I opening cans when used to require assistance to open.   Exercises: Pt completed W/C propulsion from room><day room with no rest breaks in order to increase BUE strength/endurance, pt requiring increased time although was able to complete.   Pt seated in W/C at end of session 4Ps assessed and direct handoff to next OT session.    Session 2 General: "I'll give it my all" Pt seated in W/C upon OT arrival, agreeable to OT. Wanting to get in bed 2/2 feet feeling extremely cold. OT assisted pt to bed with stand pivot transfer at [facility] with no AD  Pain:  7/10 pain reported in low back, activity, intermittent rest breaks, distractions provided for pain management, pt reports tolerable to proceed.   Exercises: Pt completed the following exercise circuit in order to improve  functional activity, strength and endurance to prepare for increased activity tolerance during ADLs. Pt completed the following exercises in supine position with no noted LOB/SOB and 3x10 repetitions on each exercise: -chest press with BUE then single hand, 2# wrapped on hand with theraband d/t decreased grip on Lt side -shoulder flexion from lap to overhead -gravity eliminated triceps extensions -bicep curls -cable flys with red theraband  PRN rest breaks during activities and occasional assist with LUE for ataxia management.   Pt supine in bed with bed alarm activated, 2 bed rails up, call light within reach and 4Ps assessed.   Therapy/Group: Individual Therapy  Velia Meyer, OTD, OTR/L 11/30/2023, 3:51 PM

## 2023-11-30 NOTE — Plan of Care (Signed)
  Problem: Consults Goal: RH SPINAL CORD INJURY PATIENT EDUCATION Description:  See Patient Education module for education specifics.  Outcome: Progressing   Problem: RH SAFETY Goal: RH STG ADHERE TO SAFETY PRECAUTIONS W/ASSISTANCE/DEVICE Description: STG Adhere to Safety Precautions With  cues Assistance/Device. Outcome: Progressing   Problem: RH PAIN MANAGEMENT Goal: RH STG PAIN MANAGED AT OR BELOW PT'S PAIN GOAL Description: < 4 with prns Outcome: Progressing   Problem: RH KNOWLEDGE DEFICIT SCI Goal: RH STG INCREASE KNOWLEDGE OF SELF CARE AFTER SCI Description: Patient and spouse will be able to manage care at discharge using educational resources for medications and dietary modification independently.  Outcome: Progressing

## 2023-11-30 NOTE — Progress Notes (Signed)
Physical Therapy Session Note  Patient Details  Name: Jonathan Luna MRN: [id number] Date of Birth: 31-Mar-1966  Today's Date: 11/30/2023 PT Individual Time: 0900-1000 PT Individual Time Calculation (min): 60 min   Short Term Goals: Week 3:  PT Short Term Goal 1 (Week 3): Pt will require minA with steps x2 with HR PT Short Term Goal 2 (Week 3): Pt will ambulate 150 feet with LRAD and SBA PT Short Term Goal 3 (Week 3): Pt will maintain dynamic standing balance with SBA and LRAD for duration of therapeutic activity (Wii, connect 4 game, etc). PT Short Term Goal 4 (Week 3): Pt will perform sit<>stand with supervision and LRAD  Skilled Therapeutic Interventions/Progress Updates:      Therapy Documentation Precautions:  Precautions Precautions: Fall Restrictions Weight Bearing Restrictions Per Provider Order: No  Pt agreeable to PT session with emphasis on gait training without UE support or AD. Pt initially ambulated 65' + 30' in rehab hallway largely min A with mod A for one loss of balance. Pt presents difficulty with distracting and busy environment therefore continued gait training in [facility] hallway as more quiet and less distracting. Pt ambulated 5' + 160' CGA/min A with min cues for scissoring and pace. Pt returned to room, left seated at bedside with all needs in reach.     Therapy/Group: Individual Therapy  Truitt Leep Truitt Leep PT, DPT  11/30/2023, 12:19 PM

## 2023-11-30 NOTE — Progress Notes (Incomplete)
Charge Nurse called to room concerning issue. Approached patients room with aptient in the

## 2023-11-30 NOTE — Progress Notes (Signed)
Occupational Therapy Session Note  Patient Details  Name: Jonathan Luna MRN: [id number] Date of Birth: [birth year]  Today's Date: 11/30/2023 OT Individual Time: 0938-1829 OT Individual Time Calculation (min): 72 min    Short Term Goals: Week 3:  OT Short Term Goal 1 (Week 3): Pt will complete LB dressing at Min A with AE as necessary OT Short Term Goal 2 (Week 3): Pt will complete toilet transfer at SBA with AD OT Short Term Goal 3 (Week 3): Pt will complete dynamic standing activities with Min A  Skilled Therapeutic Interventions/Progress Updates:    OT intervention with focus on BUE wrist/finger extension for functional grasp and self feeding options (AE). Pt able to doff/don Bil wrist splints independently. Block practice finger flexion/extension with HOH assist. Block practice BUE pronation/supination without assistance.   Saebo Stim One 20 mins RUE and 15 mins LUE wrist/finger extensors 330 pulse width 35 Hz pulse rate On 8 sec/ off 8 sec Ramp up/ down 2 sec Symmetrical Biphasic wave form  Max intensity at 500 Ohm load  No adverse reactions.  Pt issued scoop plate and plate guard with dycem. OTA demoed use of equipment.   Pt propelled w/c ~50' towards room. Pt returned to room. Pt remained in w/c with all needs within reach.  Therapy Documentation Precautions:  Precautions Precautions: Fall Restrictions Weight Bearing Restrictions Per Provider Order: No   Pain: Pt reports his pain is "ok" this morning  Therapy/Group: Individual Therapy  Rich Brave 11/30/2023, 11:58 AM

## 2023-11-30 NOTE — Progress Notes (Signed)
PROGRESS NOTE   Subjective/Complaints:  Pt reports no plans to pursue Rituximab at this time-  per Neuro note, holding off on Ritiuximab given results with IVIG and will put on maintenance dose q week.  Over 2 days.  Also to con't Prednisone 60 mg daily.   Pt reports having some WE now- it's new Feet still really cold-   Pt reports LB< yesterday.  Also c/o calves being tight and tremors in legs when flexes legs- thought was related to IVIG, explained it's likely due to weakness.  Also reports walked 135 ft with HHA Min A yesterday.  Ate 100% tray.   ROS:    Pt denies SOB, abd pain, CP, N/V/C/D, and vision changes  Cold feet (+) when feet dangle for more than 15 minutes off bed  Objective:   No results found.  Recent Labs    11/30/23 0523  WBC 11.2*  HGB 13.8  HCT 41.8  PLT 310      Recent Labs    11/30/23 0523  NA 138  K 3.9  CL 100  CO2 28  GLUCOSE 81  BUN 11  CREATININE 0.75  CALCIUM 9.3      Intake/Output Summary (Last 24 hours) at 11/30/2023 0810 Last data filed at 11/30/2023 0731 Gross per 24 hour  Intake 480 ml  Output 2175 ml  Net -1695 ml        Physical Exam: Vital Signs Blood pressure 122/79, pulse 63, temperature 98.3 F (36.8 C), temperature source Oral, resp. rate 18, height 5\' 7"  (1.702 m), weight 82.5 kg, SpO2 100%.    General: awake, alert, appropriate, sitting up in bed; feet were dangling initially at EOB; NAD HENT: conjugate gaze; oropharynx moist CV: regular rate and rhythm; no JVD Pulmonary: CTA B/L; no W/R/R- good air movement GI: soft, NT, ND, (+)BS- normoactive Psychiatric: appropriate, mildly anxious Neurological: Ox3 WE 1 to 2-/5 B/L- is new Extremities:-Well-perfused appearance, cool to the touch in bilateral feet- no change today Biceps, triceps, and shoulder abduction 4+/5; WE 0/5; Grip 3/5 and on R FA 1/5 and on L 1/5 RLE- HF 4 to 4+/5 KE 4/5; DF 0/5;  PF 3+/5 LLE- HF 4 to 4+/5; KE 4/5; DF 0/5 and PF 3+/5 Extremities:- no LE swelling in knees or ankles B/L  sensation:  Decreased to light touch from elbows to finger tips B/L. L>R    Assessment/Plan: 1. Functional deficits which require 3+ hours per day of interdisciplinary therapy in a comprehensive inpatient rehab setting. Physiatrist is providing close team supervision and 24 hour management of active medical problems listed below. Physiatrist and rehab team continue to assess barriers to discharge/monitor patient progress toward functional and medical goals  Care Tool:  Bathing    Body parts bathed by patient: Right arm, Left lower leg, Face, Left arm, Chest, Abdomen, Front perineal area, Buttocks, Right upper leg, Left upper leg, Right lower leg         Bathing assist Assist Level: Supervision/Verbal cueing     Upper Body Dressing/Undressing Upper body dressing   What is the patient wearing?: Pull over shirt    Upper body assist Assist Level: Moderate Assistance - Patient 50 -  74%    Lower Body Dressing/Undressing Lower body dressing      What is the patient wearing?: Underwear/pull up, Pants     Lower body assist Assist for lower body dressing: Maximal Assistance - Patient 25 - 49%     Toileting Toileting    Toileting assist Assist for toileting: Moderate Assistance - Patient 50 - 74%     Transfers Chair/bed transfer  Transfers assist     Chair/bed transfer assist level: Contact Guard/Touching assist Chair/bed transfer assistive device: Walker, Archivist   Ambulation assist      Assist level: Contact Guard/Touching assist Assistive device: Walker-rolling Max distance: 175   Walk 10 feet activity   Assist     Assist level: Contact Guard/Touching assist Assistive device: Walker-rolling   Walk 50 feet activity   Assist    Assist level: Contact Guard/Touching assist Assistive device: Walker-rolling    Walk  150 feet activity   Assist Walk 150 feet activity did not occur:  (fatigue and imbalance)  Assist level: Contact Guard/Touching assist Assistive device: Walker-rolling    Walk 10 feet on uneven surface  activity   Assist Walk 10 feet on uneven surfaces activity did not occur: Safety/medical concerns (did not attempt d/t pt fatigue)         Wheelchair     Assist Is the patient using a wheelchair?: Yes Type of Wheelchair: Manual Wheelchair activity did not occur: Safety/medical concerns  Wheelchair assist level: Supervision/Verbal cueing Max wheelchair distance: 150    Wheelchair 50 feet with 2 turns activity    Assist    Wheelchair 50 feet with 2 turns activity did not occur:  (fatigue and imbalance)   Assist Level: Supervision/Verbal cueing   Wheelchair 150 feet activity     Assist  Wheelchair 150 feet activity did not occur:  (fatigue and imbalance)   Assist Level: Supervision/Verbal cueing   Blood pressure 122/79, pulse 63, temperature 98.3 F (36.8 C), temperature source Oral, resp. rate 18, height 5\' 7"  (1.702 m), weight 82.5 kg, SpO2 100%.  Medical Problem List and Plan: 1. 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  Antithrombotics: -DVT/anticoagulation:  Pharmaceutical: Lovenox             -antiplatelet therapy: aspirin 81 mg daily   3. Pain Management: Tylenol as needed             -continue gabapentin 900 mg TID and 900 mg daily PRN             -continue Percocet 5/325 q 4 hours PRN  -Continue Cymbalta 60 mg daily   -Consider trial of Lyrica to replace gabapentin   2/6- will increase Duloxetine to 90 mg daily and stop Zoloft so  can do so.   2/9 pain improving. Continue plan above  2/12- pt reports still a major issue- but will increase Duloxetine to 120 mg daily and monitor  2/14- no improvement so far, but  just started yesterday with increased dose- 2-15: Ongoing severe back pain overnight.  Is using Percocet consistently every 4 hours, has not used Tylenol since 2-1.  Did use as needed gabapentin overnight, with benefit. 2/17- taking pain meds regularly.  4. Mood/Behavior/Sleep: LCSW to evaluate and provide emotional support             -continue Cymbalta 60 mg daily             -continue Zoloft 25 mg daily             -continue clonazepam 0.5 mg BID prn             -antipsychotic agents: n/a  -Patient requested increase of clonazepam due to anxiety.  He takes this medication chronically PDMP reviewed, will increase temporarily to 1 mg twice daily as needed  -1/31 pt reports anxiety is improved   2/4- pt reports anxiety is better due to Prednisone same with irritability.   2/10- appears more anxious since recovery not going as expected  2/13 -2/17- Anxiety getting worse as day of d/c gets closer  5. Neuropsych/cognition: This patient is capable of making decisions on his own behalf.   6. Skin/Wound Care: Routine skin care checks   7. Fluids/Electrolytes/Nutrition: Routine Is and Os and follow-up chemistries   8: Hypertension: monitor TID and prn             -continue Avapro 300 mg daily             -continue Toprol-XL 25 mg daily  -1/31 controlled, continue current      11/30/2023    5:01 AM 11/29/2023    8:16 PM 11/29/2023    3:53 PM  Vitals with BMI  Systolic 122 136 782  Diastolic 79 89 93  Pulse 63 81 87  2/1 Will reduce losartan to 75 mg, start amlodipine 5 mg and monitor- see no. 14 BP controlled will try on amlodipine alone 2/3 BP controlled continue current regimen   2/4- BP a little elevated DBP- at 90, but otherwise well controlled- con't regimen  2/9 begin norvasc 2.5mg  daily for  feet/hands per below  2/10- will try it again, but wasn't working when was on it before  2/11- no change so far, but will   2/12- BP usually controlled, however will wait on increase of Norvasc for foot coldness  2/13- slightly elevated this AM, however was soft last evening- won't make any changes yet  2/14- BP controlled- but can increase Norvasc to 5 mg over weekend if need be for foot coldness  2/17- BP controlled on Norvasc-   9: Hypothyroidism: continue Synthroid 125 mcg daily   10: Tobacco use: cessation counseling -Nicorette gum as needed (not using)             -? 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 end date?) -follow-up Dr. Terrace Arabia -1/30 respiratory function- NIF >-40 and IS 2500, stable. Continue to monitor -2/3 NIF/IS stable   2/4- NIF's VC OK- on ICS actually- spoke with Dr Terrace Arabia- she wants [name] to do another dosing of IVIG over a total of 4 days- 2g/kg total dose over 4 days- will arrange with Pharmacy- spoke with them as well- will also increase Prednisone to 60 mg daily- since I think it was decreased- supposed to be on until f/u with Dr Terrace Arabia- needs BMP daily per pharmacy while on IVIG.   2/11- will  reach out to to Dr Terrace Arabia since strength not too much better after IVIG-    2/12- haven't heard back from Dr Terrace Arabia- will wait to hear something- explained to pt that I don't know if there's another tx- at least form my experience other than what's been tried.   2/13- waiting to have pt seen by Neuro- d/w them this AM- they will help narrow down plan.   2/14- started IVIG again x 2 days- waiting for the moment on Rituximab - 2-15: CT with resolving right upper lobe scarring, no other concerning masses for neoplastic process.  Neurology holding rituximab until QuantiFERON gold results. Unable to do Paraneoplastic panel as unreliable after IVIG.  --2/16 QuantiFERON still pending, per neurology patient is feeling 50% better after  IVIG; holding off on paraneoplastic panel and rituximab  2/17- Going to do IVIG qweek for 2 days/week- will d/w Neuro when next to do IVIG- Friday? 12: Leukocytosis: likely steroid induced, improving; no fever              -follow-up CBC with differential Thursday 1/30 and q Monday  1/3 WBC elevated but stable at 15,  likely steroid related  2/10- WBC 11.9- doing better on steroids    Latest Ref Rng & Units 11/30/2023    5:23 AM 11/23/2023    5:40 AM 11/16/2023    5:42 AM  CBC  WBC 4.0 - 10.5 K/uL 11.2  11.9  15.0   Hemoglobin 13.0 - 17.0 g/dL 16.1  09.6  04.5   Hematocrit 39.0 - 52.0 % 41.8  40.8  40.9   Platelets 150 - 400 K/uL 310  347  397     13.  Elevated ALT  -2/3 improved to 48 today  2/10- will recheck next week    14. Cold feeling in b/l legs.  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  EXTR warm, pulses are normal, do not think he needs arterial Dopplers.  Likely autonomic, trial calcium channel blocker  2/5- so far, no improvement with Norvasc 5 mg daily-   2/6- pt emphatic that it's the worst part of his pain- he also describes feet feeling wet - but are dry and warmish to touch- will increase Duloxetine to 90 mg daily and sotp Zoloft.   2/7- pt agreed to change- said nerve pain/cold in feet slightly better already- thinks Norvasc made him feel fatigued, so wants to stop   2/9 pt willing to try norvasc again for sx. Feels that it helped   -will start low dose 2.5mg  daily   2/10- will con't for now- pt told me didn't help initially, but we will see  2/11- pt says yesterday cold was SO bad, it was really painful- will con't Amlodipine. 2/12- Will increase Duloxetine to 120 mg daily- is really bothering him. Wait to increase Norvasc today  2/13- no change, but has noticed when  leaves feet down at least 15 minutes, feet get colder 2/14- can increase Norvasc to 5 mg over the weekend 2/15: Cold sensitivity seems dependent on positioning, patient in bed today and not bothered, will hold further  medication for now 2/16: Increase Norvasc for sensitivity to 5 mg daily 2/17- no change in cold feelings of fet- wife bringing in wool socks for him.  15. Constipation  2/15 LBM, large  I spent a total of 39   minutes on total care today- >50% coordination of care- due to d/w nursing about pt care- review of Neuro notes and weekend notes- and labs- and vitals  LOS: 20 days A FACE TO FACE EVALUATION WAS PERFORMED  Carron Mcmurry 11/30/2023, 8:10 AM

## 2023-12-01 ENCOUNTER — Ambulatory Visit: Payer: 59 | Admitting: Occupational Therapy

## 2023-12-01 ENCOUNTER — Ambulatory Visit: Payer: 59

## 2023-12-01 NOTE — Progress Notes (Signed)
Occupational Therapy Session Note  Patient Details  Name: Jonathan Luna MRN: [id number] Date of Birth: 1965/11/10  Today's Date: 12/01/2023 OT Individual Time: 4696-2952 OT Individual Time Calculation (min): 62 min    Short Term Goals: Week 3:  OT Short Term Goal 1 (Week 3): Pt will complete LB dressing at Min A with AE as necessary OT Short Term Goal 2 (Week 3): Pt will complete toilet transfer at SBA with AD OT Short Term Goal 3 (Week 3): Pt will complete dynamic standing activities with Min A  Skilled Therapeutic Interventions/Progress Updates:      Therapy Documentation Precautions:  Precautions Precautions: Fall Restrictions Weight Bearing Restrictions Per Provider Order: No General: "I need a shower" Pt seated in W/C upon OT arrival, agreeable to OT.  Pain:  5/10 pain reported in low back, shower, activity, intermittent rest breaks, distractions provided for pain management, pt reports tolerable to proceed.   ADL: Pt instructed to gather clothing for showe for increased independence with ADL activities and to improve activity tolerance. Pt ambulating around room CGA with RW, able to gather clothing from top and second drawer with CGA taking appropriate angles in order to access drawers. IV covered for bathing. Pt doffed/donned UB clothing SBA and LB clothing CGA and AFOs SBA seated on TTB. Pt bathing seated on TTB for entirety of shower, also able to maneuver shampoo bottle and squeeze soap out in modified way.   Other Treatments: Pt educated to set clothing up night before once home in order to increase energy conservation and ease of access for clothing next to bed at home.   Pt seated in W/C at end of session with W/C alarm donned, call light within reach and 4Ps assessed.    Therapy/Group: Individual Therapy  Velia Meyer, OTD, OTR/L 12/01/2023, 3:46 PM

## 2023-12-01 NOTE — Progress Notes (Signed)
Physical Therapy Session Note  Patient Details  Name: Jonathan Luna MRN: [id number] Date of Birth: Feb 06, 1966  Today's Date: 12/01/2023 PT Individual Time: 1300-1417 PT Individual Time Calculation (min): 77 min   Short Term Goals: Week 3:  PT Short Term Goal 1 (Week 3): Pt will require minA with steps x2 with HR PT Short Term Goal 2 (Week 3): Pt will ambulate 150 feet with LRAD and SBA PT Short Term Goal 3 (Week 3): Pt will maintain dynamic standing balance with SBA and LRAD for duration of therapeutic activity (Wii, connect 4 game, etc). PT Short Term Goal 4 (Week 3): Pt will perform sit<>stand with supervision and LRAD  Skilled Therapeutic Interventions/Progress Updates:      Therapy Documentation Precautions:  Precautions Precautions: Fall Restrictions Weight Bearing Restrictions Per Provider Order: No  Pt agreeable to PT session with emphasis on discharge planning, gait training, and ramp navigation. Pt without reports of pain and dependently transported to dayroom for energy conservation. Pt ambulated 360' + 175' with RW CGA progressed to close supervision, with cues for pacing and rest breaks. Engaged in discussion with patient following activity regarding activity modification and energy conservation to encourage body awareness for safety. PT also recommended not to perform high level balance activities at home for safety. Discussed pt's home set-up as pt on incline therefore practiced ramp navigation close supervision, plan to practice steeper ramp on follow up session. Pt returned to room and left seated at bedside with all needs in reach.     Therapy/Group: Individual Therapy  Truitt Leep Truitt Leep PT, DPT  12/01/2023, 3:50 PM

## 2023-12-01 NOTE — Patient Care Conference (Signed)
Inpatient RehabilitationTeam Conference and Plan of Care Update Date: 12/01/2023   Time: 1142 am    Patient Name: Jonathan Luna      Medical Record Number: [id number]  Date of Birth: 1966/06/11 Sex: Male         Room/Bed: 4W10C/4W10C-01 Payor Info: Payor: Advertising copywriter / Plan: [employer] OTHER / Product Type: *No Product type* /    Admit Date/Time:  11/10/2023  1:45 PM  Primary Diagnosis:  CIDP (chronic inflammatory demyelinating polyneuropathy) [facility])  Hospital Problems: Principal Problem:   CIDP (chronic inflammatory demyelinating polyneuropathy) [facility])    Expected Discharge Date: Expected Discharge Date: 12/08/23  Team Members Present: Physician leading conference: Dr. Genice Rouge Social Worker Present: Dossie Der, LCSW Nurse Present: Konrad Dolores, RN PT Present: Truitt Leep, PT OT Present: Velia Meyer, OT PPS Coordinator present : Fae Pippin, SLP     Current Status/Progress Goal Weekly Team Focus  Bowel/Bladder   continent of b/b; LBM: 2/15   gain regular bowel pattern        Swallow/Nutrition/ Hydration               ADL's   CGA overall, increased FMC in RUE   mod I   con't ADL retraining and ADL retraining and introducing AE    Mobility   SBA bed mobility, supervision transfers & gait ~165 ft with RW, CGA for dynamic standing balance   Mod I  gait without AD for balance training; AFO re-consult    Communication                Safety/Cognition/ Behavioral Observations               Pain   c/o chronic back pain; PRN percocet   pain level <5/10   assess pain QS and prn    Skin   skin intact   maintain skin integrity  assess skin QS and prn      Discharge Planning:  Home with wife who is blind and works during the day. Pt making progress but limited by pain. Have set up [facility] for home health at [place]. Await DME recommendations   Team Discussion Patient admitted post CIDP exacerbation; treated with IVIG.  Patient  limited by pain and anxiety.  Patient on target to meet rehab goals: yes, patient requires contact guard assist with ADLs; Patient requires supervision with transfers and able to  ambulation  165 ' using a rolling walker. Goals for discharge set for Mod I overall.  *See Care Plan and progress notes for long and short-term goals.   Revisions to Treatment Plan:  Neuropsych consult AFO consult  Teaching Needs: Safety, medications, toileting , transfers, etc.  Current Barriers to Discharge: Decreased caregiver support and Behavior  Possible Resolutions to Barriers: Family education Home health follow-up     Medical Summary Current Status: CIDP- will do maintenance IVIG this weekend- cold feet-  Barriers to Discharge: Behavior/Mood;Medical stability;Weight bearing restrictions;Other (comments);Uncontrolled Pain  Barriers to Discharge Comments: limited by pain- taking oxy every 4 hours-  very anxious turning to anger- sometimes self limiting- Possible Resolutions to [employer] Focus: IVIG every week d/w Dr Terrace Arabia- walking without RW with PT only- to work on balance-165 ft- has mod I goals- has AFOs- being titrated has fit- d/c 2/25   Continued Need for Acute Rehabilitation Level of Care: The patient requires daily medical management by a physician with specialized training in physical medicine and rehabilitation for the following reasons: Direction of a multidisciplinary  physical rehabilitation program to maximize functional independence : Yes Medical management of patient stability for increased activity during participation in an intensive rehabilitation regime.: Yes Analysis of laboratory values and/or radiology reports with any subsequent need for medication adjustment and/or medical intervention. : Yes   I attest that I was present, lead the team conference, and concur with the assessment and plan of the team.   Gwenyth Allegra 12/01/2023, 4:11 PM

## 2023-12-01 NOTE — Progress Notes (Signed)
Occupational Therapy Session Note  Patient Details  Name: Jonathan Luna MRN: [id number] Date of Birth: 1966-08-18  Today's Date: 12/01/2023 OT Individual Time: 0900-1015 OT Individual Time Calculation (min): 75 min    Short Term Goals: Week 3:  OT Short Term Goal 1 (Week 3): Pt will complete LB dressing at Min A with AE as necessary OT Short Term Goal 2 (Week 3): Pt will complete toilet transfer at SBA with AD OT Short Term Goal 3 (Week 3): Pt will complete dynamic standing activities with Min A  Skilled Therapeutic Interventions/Progress Updates:    Pt resting in bed upon arrival. Supine>sit with supervision. Pt amb with RW to w/c and transported for time mgmt to ADL apt. Pt removed Bil wrist splints for all further transfers/amb with RW. Pt practiced sit<>supine in std bed with supervision. Pt practiced sit<>stand from recliner with supervision. Pt transitioned to tub room to practice TTB transfers with supervision. Pt will have to side step to access bathroom at home. Pt practiced side stepping through doorway with supervsion. BUE therex with 4# cuff weights-LUE 3x10, RUE 3x15. Therex with 2/5# cuff weights-punches LUE 3x10, RUE 3x15. Pt returned to room and remained in w/c with all needs within reach.   Therapy Documentation Precautions:  Precautions Precautions: Fall Restrictions Weight Bearing Restrictions Per Provider Order: No   Pain:  Pt denies pain this morning   Therapy/Group: Individual Therapy  Rich Brave 12/01/2023, 11:09 AM

## 2023-12-01 NOTE — Progress Notes (Signed)
NEUROLOGY CONSULT FOLLOW UP NOTE   Date of service: December 01, 2023 Patient Name: Jonathan Luna MRN:  [id number]  Interval hx   Seen and examined. Reports same as 2 days ago - no further improvement.   Past History   Past Medical History:  Diagnosis Date   Allergy    Anxiety    Arthritis    Cataract    Community acquired pneumonia 08/28/2023   Depression    Detached retina    Heart murmur    History of bilateral cataract extraction    Hyperlipidemia    Hypothyroidism    Low back pain    Panic attack    Panic attacks    Pneumonia of right upper lobe due to infectious organism 08/18/2023    Past Surgical History:  Procedure Laterality Date   CATARACT EXTRACTION     EYE SURGERY     IR FLUORO GUIDE CV LINE RIGHT  10/29/2023   IR US GUIDE VASC ACCESS RIGHT  10/29/2023    Family History: Family History  Problem Relation Age of Onset   Breast cancer Mother    Hypertension Father    Heart disease Father    Asthma Father    Emphysema Father        smoker for 45 years   Diabetes Brother    Hyperlipidemia Brother     Social History  reports that he has never smoked. He has been exposed to tobacco smoke. He has never used smokeless tobacco. He reports that he does not currently use alcohol. He reports that he does not use drugs.  Allergies  Allergen Reactions   Amoxil [Amoxicillin] Other (See Comments)    Pt states that it does not work for him when he had back to back pneumonia   Effexor [Venlafaxine] Diarrhea   Prozac [Fluoxetine Hcl] Diarrhea    Medications   Current Facility-Administered Medications:    acetaminophen (TYLENOL) tablet 325-650 mg, 325-650 mg, Oral, Q4H PRN, Milinda Antis, PA-C, 650 mg at 11/14/23 0827   albuterol (PROVENTIL) (2.5 MG/3ML) 0.083% nebulizer solution 2.5 mg, 2.5 mg, Nebulization, Q4H PRN, Setzer, Lynnell Jude, PA-C   alum & mag hydroxide-simeth (MAALOX/MYLANTA) 200-200-20 MG/5ML suspension 30 mL, 30 mL, Oral, Q4H PRN, Setzer, Lynnell Jude, PA-C, 30 mL at 11/18/23 1124   amLODipine (NORVASC) tablet 5 mg, 5 mg, Oral, Daily, Elijah Birk C, DO, 5 mg at 12/01/23 0810   aspirin EC tablet 81 mg, 81 mg, Oral, Daily, Milinda Antis, PA-C, 81 mg at 12/01/23 4098   bisacodyl (DULCOLAX) EC tablet 5 mg, 5 mg, Oral, Daily PRN, Setzer, Sandra J, PA-C   calcium carbonate (TUMS - dosed in mg elemental calcium) chewable tablet 400 mg of elemental calcium, 400 mg of elemental calcium, Oral, QID PRN, Milinda Antis, PA-C, 400 mg of elemental calcium at 11/26/23 2308   clonazePAM (KLONOPIN) tablet 1 mg, 1 mg, Oral, BID PRN, Fanny Dance, MD, 1 mg at 11/30/23 2115   dextromethorphan-guaiFENesin (MUCINEX DM) 30-600 MG per 12 hr tablet 1 tablet, 1 tablet, Oral, BID, Setzer, Lynnell Jude, PA-C, 1 tablet at 12/01/23 0809   diphenhydrAMINE (BENADRYL) capsule 25 mg, 25 mg, Oral, Q6H PRN, Milinda Antis, PA-C, 25 mg at 11/27/23 2009   DULoxetine (CYMBALTA) DR capsule 120 mg, 120 mg, Oral, Daily, Lovorn, Megan, MD, 120 mg at 12/01/23 0809   enoxaparin (LOVENOX) injection 40 mg, 40 mg, Subcutaneous, Q24H, Setzer, Lynnell Jude, PA-C, 40 mg at 11/30/23 1710   gabapentin (  NEURONTIN) capsule 900 mg, 900 mg, Oral, TID, Setzer, Lynnell Jude, PA-C, 900 mg at 12/01/23 0809   gabapentin (NEURONTIN) capsule 900 mg, 900 mg, Oral, Daily PRN, Milinda Antis, PA-C, 900 mg at 11/30/23 1717   levothyroxine (SYNTHROID) tablet 125 mcg, 125 mcg, Oral, Q0600, Milinda Antis, PA-C, 125 mcg at 12/01/23 0544   metoprolol succinate (TOPROL-XL) 24 hr tablet 25 mg, 25 mg, Oral, Daily, Setzer, Lynnell Jude, PA-C, 25 mg at 12/01/23 6962   nicotine polacrilex (NICORETTE) gum 2 mg, 2 mg, Oral, PRN, Milinda Antis, PA-C   ondansetron [facility]) tablet 4 mg, 4 mg, Oral, Q6H PRN, 4 mg at 11/19/23 1401 **OR** ondansetron (ZOFRAN) injection 4 mg, 4 mg, Intravenous, Q6H PRN, Setzer, Sandra J, PA-C   oxyCODONE-acetaminophen (PERCOCET/ROXICET) 5-325 MG per tablet 1 tablet, 1 tablet, Oral, Q4H PRN,  Setzer, Lynnell Jude, PA-C, 1 tablet at 12/01/23 1219   polyethylene glycol (MIRALAX / GLYCOLAX) packet 17 g, 17 g, Oral, Daily PRN, Setzer, Sandra J, PA-C   predniSONE (DELTASONE) tablet 60 mg, 60 mg, Oral, Q breakfast, Lovorn, Megan, MD, 60 mg at 12/01/23 0809   sodium phosphate (FLEET) enema 1 enema, 1 enema, Rectal, Once PRN, Milinda Antis, PA-C  Vitals   Vitals:   11/30/23 0501 11/30/23 1327 11/30/23 1941 12/01/23 0355  BP: 122/79 127/83 118/72 107/63  Pulse: 63 84 77 67  Resp: 18 18 18 18   Temp: 98.3 F (36.8 C) 98.3 F (36.8 C) 98.1 F (36.7 C) (!) 97.5 F (36.4 C)  TempSrc: Oral   Oral  SpO2: 100% 95% 94% 100%  Weight:      Height:        Body mass index is 28.49 kg/m.  Physical Exam   Constitutional: Appears well-developed and well-nourished.  Laying in bed. Psych: Affect appropriate to situation.  Eyes: No scleral injection.  Evidence of past irodotomy  HENT: No OP obstruction.  Head: Normocephalic.  Cardiovascular: Normal rate and regular rhythm.  Respiratory: Effort normal, non-labored breathing.  GI: Soft.  No distension. There is no tenderness.  Skin: WDI.   Neurologic Examination   MENTAL STATUS: AAOx3, attention intact evidence by spelling forward/backwards and per conversation. fund of knowledge appropriate.   LANG/SPEECH: Naming and repetition intact, fluent, follows 3-step commands. No dysarthria with complex phrases.   CRANIAL NERVES: II: Pupils equal and reactive, no RAPD, no visual field deficits, appropriate to past retinal detachment.  III, IV, VI: EOM intact, no gaze preference or deviation, no nystagmus. V: normal sensation in V1, V2, and V3 segments bilaterally VII: no asymmetry, no nasolabial fold flattening VIII: normal hearing to speech IX, X: normal palatal elevation, no uvular deviation XI: 5/5 head turn and 5/5 shoulder shrug bilaterally XII: midline tongue protrusion  MOTOR: Muscle bulk: Borderline normal, tone slightly decreased,  tremor with movement Mvmt Root Nerve  Muscle Right Left Comments  SA C5/6 Ax Deltoid 5 5   EF C5/6 Mc Biceps 4+ 4+   EE C6/7/8 Rad Triceps 5 5   WF C6/7 Med FCR 3 3   WE C7/8 PIN ECU 1 1   F Ab C8/T1 U ADM/FDI     HF L1/2/3 Fem Illopsoas 4+ 4+   KE L2/3/4 Fem Quad 4+ 4+   DF L4/5 D Peron Tib Ant 3 3   PF S1/2 Tibial Grc/Sol 4 4     REFLEXES: absent throughout, bilateral flexor plantar response  SENSORY: Cannot feel temperature below knee bilaterally.  Vibration decreased on knees and  hands bilaterally.  Sensation intact to light touch throughout No hemineglect, no extinction to double sided stimulation (visual & tactile)  COORD: Dyssynergia with upper extremity strength testing (L>R). Tremors with exertions of bilateral upper extremities.  STATION: Deferred due to weakness GAIT: Deferred due to weakness   Labs/Imaging/Neurodiagnostic studies   CBC:  Recent Labs  Lab 12-17-2023 0523  WBC 11.2*  HGB 13.8  HCT 41.8  MCV 91.7  PLT 310   Basic Metabolic Panel:  Lab Results  Component Value Date   NA 138 12/17/23   K 3.9 2023/12/17   CO2 28 2023/12/17   GLUCOSE 81 17-Dec-2023   BUN 11 Dec 17, 2023   CREATININE 0.75 December 17, 2023   CALCIUM 9.3 2023-12-17   GFRNONAA >60 12-17-23   GFRAA 94 08/16/2008   Lipid Panel:  Lab Results  Component Value Date   LDLCALC 106 (H) 10/08/2023   HgbA1c:  Lab Results  Component Value Date   HGBA1C 5.3 09/22/2023   Urine Drug Screen:     Component Value Date/Time   LABOPIA NEG 09/29/2012 0830   COCAINSCRNUR Negative 08/18/2023 1114   COCAINSCRNUR NEG 09/29/2012 0830   LABBENZ POS (A) 09/29/2012 0830   AMPHETMU NEG 09/29/2012 0830     INR  Lab Results  Component Value Date   INR 1.0 08/29/2023   APTT  Lab Results  Component Value Date   APTT 32 08/29/2023   IgG 1/16: 1206 IgA 1/16: 582 TSH, T4: Within normal limits Acute hepatitis panel: Negative QuantiFERON: Pending Paraneoplastic panel: Pending  CT Head  without contrast(Personally reviewed) 10/25/2023: No acute intracranial findings.  MRI brain without contrast (Personally reviewed) 09/30/2024: Normal brain MRI.   CT Chest w/o C: Continued resolution of the right upper lobe image is seen on prior examinations. Only minimal residual scarring remains. No focal nodule or mass is seen.  ASSESSMENT   FINNICK OROSZ is a 58 y.o. 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  He improved about 50% after first IVIG treatment but then declined again. He underwent PLEX with no significant improvement and then another round of IVIG in rehab with slight 10-20% improvement. He is on steroid but feels that it has not helped.  My outgoing colleague had discussed with Dr. Terrace Arabia with Neuromuscular team and concern for treatment refractory CIDP.  Earlier there was some discussion about Rituxan but given improvement with IVIG, that was put on hold.  Today's examination findings were discussed with Dr. Terrace Arabia again.  I would favor Rituxan.  She is in agreement.  Now that he feels that his symptoms have plateaued and he has not improved much with the additional dose of IVIG, I think it is prudent to give him a Rituxan infusion while inpatient.  Impression: CIDP  RECOMMENDATIONS  Continue prednisone 60 mg once daily Quantiferon gold plus is still pending. Consider Rituxan (one dose inpatient) - will discuss with patient and order after quantiferon test is back.  ___________________________________________________________________   -- Milon Dikes, MD Neurologist [facility]

## 2023-12-01 NOTE — Progress Notes (Signed)
Patient ID: Jonathan Luna, male   DOB: [birth year], 58 y.o.   MRN: [id number]  Met with pt to discuss team conference update of progress this week. He is doing much better since IVIG infusion and has more strength and movement. He is pleased with this but continues to focus on his cold feet and MD has addressed this with him. He is aware he will ned to order a rolling walker since his insurance covered is rollator at home. He will go online and order it. He voiced wife has questions regarding medical and progression of his condition. Encouraged him to ask Dr Berline Chough or talk with neurologist about this. He will follow up with. Aware of home health agency obtained for follow up until he can go to OP again.

## 2023-12-01 NOTE — Progress Notes (Signed)
PROGRESS NOTE   Subjective/Complaints:  Pt reports h'es terrified that won't get IVIG after d/c and wil end up coming back to rehab/hospital- will reach out to Dr Terrace Arabia to discuss.   Feet in his wool socks per pt (didn't see them today) and under blankets- has to warm them up after dangling for 15 minutes- says no change- explained this is a long term issue as well as sensory deficits- and usually IVIG doesn't treat sensation issues.   Asking to do IVIG at night when does again.   ROS:    Pt denies SOB, abd pain, CP, N/V/C/D, and vision changes   Cold feet (+) when feet dangle for more than 15 minutes off bed  Objective:   No results found.  Recent Labs    11/30/23 0523  WBC 11.2*  HGB 13.8  HCT 41.8  PLT 310      Recent Labs    11/30/23 0523  NA 138  K 3.9  CL 100  CO2 28  GLUCOSE 81  BUN 11  CREATININE 0.75  CALCIUM 9.3      Intake/Output Summary (Last 24 hours) at 12/01/2023 0845 Last data filed at 12/01/2023 0750 Gross per 24 hour  Intake 1440 ml  Output 3100 ml  Net -1660 ml        Physical Exam: Vital Signs Blood pressure 107/63, pulse 67, temperature (!) 97.5 F (36.4 C), temperature source Oral, resp. rate 18, height 5\' 7"  (1.702 m), weight 82.5 kg, SpO2 100%.     General: awake, alert, appropriate, sitting up in bed; feet in blankets;  NAD HENT: conjugate gaze; oropharynx moist CV: regular rate and rhythm; no JVD Pulmonary: CTA B/L; no W/R/R- good air movement GI: soft, NT, ND, (+)BS Psychiatric: appropriate- more anxious than normal  Neurological: Ox3  MSK today: RUE- Biceps/triceps 4+/5; WE 1/5; grip 2/5 and FA 2-/5 LLE- Biceps/triceps 4/5; WE 1/5; grip 2+/5 and FA 1/5  Last done 11/23/23 Biceps, triceps, and shoulder abduction 4+/5; WE 0/5; Grip 3/5 and on R FA 1/5 and on L 1/5 RLE- HF 4 to 4+/5 KE 4/5; DF 0/5; PF 3+/5 LLE- HF 4 to 4+/5; KE 4/5; DF 0/5 and PF  3+/5 Extremities:- no LE swelling in knees or ankles B/L  sensation:  Decreased to light touch from elbows to finger tips B/L. L>R    Assessment/Plan: 1. Functional deficits which require 3+ hours per day of interdisciplinary therapy in a comprehensive inpatient rehab setting. Physiatrist is providing close team supervision and 24 hour management of active medical problems listed below. Physiatrist and rehab team continue to assess barriers to discharge/monitor patient progress toward functional and medical goals  Care Tool:  Bathing    Body parts bathed by patient: Right arm, Left lower leg, Face, Left arm, Chest, Abdomen, Front perineal area, Buttocks, Right upper leg, Left upper leg, Right lower leg         Bathing assist Assist Level: Supervision/Verbal cueing     Upper Body Dressing/Undressing Upper body dressing   What is the patient wearing?: Pull over shirt    Upper body assist Assist Level: Moderate Assistance - Patient 50 - 74%  Lower Body Dressing/Undressing Lower body dressing      What is the patient wearing?: Underwear/pull up, Pants     Lower body assist Assist for lower body dressing: Maximal Assistance - Patient 25 - 49%     Toileting Toileting    Toileting assist Assist for toileting: Moderate Assistance - Patient 50 - 74%     Transfers Chair/bed transfer  Transfers assist     Chair/bed transfer assist level: Contact Guard/Touching assist Chair/bed transfer assistive device: Walker, Archivist   Ambulation assist      Assist level: Contact Guard/Touching assist Assistive device: Walker-rolling Max distance: 175   Walk 10 feet activity   Assist     Assist level: Contact Guard/Touching assist Assistive device: Walker-rolling   Walk 50 feet activity   Assist    Assist level: Contact Guard/Touching assist Assistive device: Walker-rolling    Walk 150 feet activity   Assist Walk 150 feet  activity did not occur:  (fatigue and imbalance)  Assist level: Contact Guard/Touching assist Assistive device: Walker-rolling    Walk 10 feet on uneven surface  activity   Assist Walk 10 feet on uneven surfaces activity did not occur: Safety/medical concerns (did not attempt d/t pt fatigue)         Wheelchair     Assist Is the patient using a wheelchair?: Yes Type of Wheelchair: Manual Wheelchair activity did not occur: Safety/medical concerns  Wheelchair assist level: Supervision/Verbal cueing Max wheelchair distance: 150    Wheelchair 50 feet with 2 turns activity    Assist    Wheelchair 50 feet with 2 turns activity did not occur:  (fatigue and imbalance)   Assist Level: Supervision/Verbal cueing   Wheelchair 150 feet activity     Assist  Wheelchair 150 feet activity did not occur:  (fatigue and imbalance)   Assist Level: Supervision/Verbal cueing   Blood pressure 107/63, pulse 67, temperature (!) 97.5 F (36.4 C), temperature source Oral, resp. rate 18, height 5\' 7"  (1.702 m), weight 82.5 kg, SpO2 100%.  Medical Problem List and Plan: 1. 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  Antithrombotics: -DVT/anticoagulation:  Pharmaceutical: Lovenox             -antiplatelet therapy: aspirin 81 mg daily   3. Pain Management: Tylenol as needed             -continue gabapentin 900 mg TID and 900 mg daily PRN             -continue Percocet 5/325 q 4 hours PRN  -Continue Cymbalta 60 mg daily   -Consider trial of Lyrica to replace gabapentin   2/6- will increase Duloxetine to 90 mg  daily and stop Zoloft so can do so.   2/9 pain improving. Continue plan above  2/12- pt reports still a major issue- but will increase Duloxetine to 120 mg daily and monitor  2/14- no  improvement so far, but just started yesterday with increased dose- 2-15: Ongoing severe back pain overnight.  Is using Percocet consistently every 4 hours, has not used Tylenol since 2-1.  Did use as needed gabapentin overnight, with benefit. 2/17- taking pain meds regularly.  4. Mood/Behavior/Sleep: LCSW to evaluate and provide emotional support             -continue Cymbalta 60 mg daily             -continue Zoloft 25 mg daily             -continue clonazepam 0.5 mg BID prn             -antipsychotic agents: n/a  -Patient requested increase of clonazepam due to anxiety.  He takes this medication chronically PDMP reviewed, will increase temporarily to 1 mg twice daily as needed  -1/31 pt reports anxiety is improved   2/4- pt reports anxiety is better due to Prednisone same with irritability.   2/10- appears more anxious since recovery not going as expected  2/13 -2/17- Anxiety getting worse as day of d/c gets closer  2/18- "terrified" about not getting IVIG after d/c and having to "start back at beginning"- reached out to Dr Terrace Arabia to try and assist.  5. Neuropsych/cognition: This patient is capable of making decisions on his own behalf.   6. Skin/Wound Care: Routine skin care checks   7. Fluids/Electrolytes/Nutrition: Routine Is and Os and follow-up chemistries   8: Hypertension: monitor TID and prn             -continue Avapro 300 mg daily             -continue Toprol-XL 25 mg daily  -1/31 controlled, continue current      12/01/2023    3:55 AM 11/30/2023    7:41 PM 11/30/2023    1:27 PM  Vitals with BMI  Systolic 107 118 409  Diastolic 63 72 83  Pulse 67 77 84  2/1 Will reduce losartan to 75 mg, start amlodipine 5 mg and monitor- see no. 14 BP controlled will try on amlodipine alone 2/3 BP  controlled continue current regimen   2/4- BP a little elevated DBP- at 90, but otherwise well controlled- con't regimen  2/9 begin norvasc 2.5mg  daily for feet/hands per below  2/10- will try it again, but wasn't working when was on it before  2/11- no change so far, but will   2/12- BP usually controlled, however will wait on increase of Norvasc for foot coldness  2/13- slightly elevated this AM, however was soft last evening- won't make any changes yet  2/14- BP controlled- but can increase Norvasc to 5 mg over weekend if need be for foot coldness  2/17- BP controlled on Norvasc-   2/18- BP soft today- but will monitor for [facility]- otherwise, con't regimen 9: Hypothyroidism: continue Synthroid 125 mcg daily   10: Tobacco use: cessation counseling -Nicorette gum as needed (not using)             -? 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 end date?) -follow-up Dr. Terrace Arabia -1/30 respiratory function- NIF >-40 and IS 2500, stable. Continue to monitor -2/3 NIF/IS stable   2/4- NIF's VC OK- on ICS actually- spoke with Dr Terrace Arabia- she wants [name] to do another dosing of IVIG over a total of 4 days- 2g/kg total dose over 4 days- will arrange with  Pharmacy- spoke with them as well- will also increase Prednisone to 60 mg daily- since I think it was decreased- supposed to be on until f/u with Dr Terrace Arabia- needs BMP daily per pharmacy while on IVIG.   2/11- will reach out to to Dr Terrace Arabia since strength not too much better after IVIG-    2/12- haven't heard back from Dr Terrace Arabia- will wait to hear something- explained to pt that I don't know if there's another tx- at least form my experience other than what's been tried.   2/13- waiting to have pt seen by Neuro- d/w them this AM- they will help narrow down plan.   2/14- started IVIG again x 2 days- waiting for the moment on Rituximab - 2-15: CT with resolving right upper lobe scarring, no other concerning masses for  neoplastic process.  Neurology holding rituximab until QuantiFERON gold results. Unable to do Paraneoplastic panel as unreliable after IVIG.  --2/16 QuantiFERON still pending, per neurology patient is feeling 50% better after IVIG; holding off on paraneoplastic panel and rituximab  2/17- Going to do IVIG qweek for 2 days/week- will d/w Neuro when next to do IVIG- Friday?  2/18- will do IVIG in evening so doesn't interfere with therapy- on Friday/Saturday it appears unless Neuro has other recs.  12: Leukocytosis: likely steroid induced, improving; no fever              -follow-up CBC with differential Thursday 1/30 and q Monday  1/3 WBC elevated but stable at 15,  likely steroid related  2/10- WBC 11.9- doing better on steroids    Latest Ref Rng & Units 11/30/2023    5:23 AM 11/23/2023    5:40 AM 11/16/2023    5:42 AM  CBC  WBC 4.0 - 10.5 K/uL 11.2  11.9  15.0   Hemoglobin 13.0 - 17.0 g/dL 78.2  95.6  21.3   Hematocrit 39.0 - 52.0 % 41.8  40.8  40.9   Platelets 150 - 400 K/uL 310  347  397     13.  Elevated ALT  -2/3 improved to 48 today  2/10- will recheck next week   2/18- ALT down to 41- is normal range  14. Cold feeling in b/l legs.  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  EXTR warm, pulses are normal, do not think he needs arterial Dopplers.  Likely autonomic, trial calcium channel blocker  2/5- so far, no improvement with Norvasc 5 mg daily-   2/6- pt emphatic that it's the worst part of his pain- he also describes feet feeling wet - but are dry and warmish to touch- will increase Duloxetine to 90 mg daily and sotp Zoloft.   2/7- pt agreed to change- said nerve pain/cold in feet slightly better already- thinks Norvasc made him feel fatigued, so wants to stop   2/9 pt willing to try norvasc again for sx. Feels that it helped   -will start low dose 2.5mg  daily   2/10- will con't for now- pt told me didn't help initially, but we will see  2/11- pt says yesterday cold was SO bad, it was really  painful- will con't Amlodipine. 2/12- Will increase Duloxetine to 120 mg daily- is really bothering him. Wait to increase Norvasc today  2/13- no change, but has noticed when  leaves feet down at least 15 minutes, feet get colder 2/14- can increase Norvasc to 5 mg over the weekend 2/15: Cold sensitivity seems dependent on positioning, patient in bed today and not bothered, will hold  further medication for now 2/16: Increase Norvasc for sensitivity to 5 mg daily 2/17- no change in cold feelings of fet- wife bringing in wool socks for him.  2/18- pt got him wool socks- pt perseverates on topic- no change so far 15. Constipation  2/15 LBM, large    I spent a total of  52  minutes on total care today- >50% coordination of care- due to d/w pt at length about his IVIG and maintenance after d/c- reached out to Dr Terrace Arabia to see if there's anything I can do to make it occur in a timely manner-    LOS: 21 days A FACE TO FACE EVALUATION WAS PERFORMED  Jonathan Luna 12/01/2023, 8:45 AM

## 2023-12-02 MED ORDER — IMMUNE GLOBULIN (HUMAN) 10 GM/100ML IV SOLN
0.5000 g/kg | INTRAVENOUS | Status: AC
  Administered 2023-12-03 – 2023-12-04 (×2): 35 g via INTRAVENOUS
  Filled 2023-12-02 (×3): qty 350

## 2023-12-02 NOTE — Progress Notes (Signed)
Occupational Therapy Weekly Progress Note  Patient Details  Name: Jonathan Luna MRN: [id number] Date of Birth: 1965/12/16  Beginning of progress report period: November 25, 2023 End of progress report period: December 02, 2023  Today's Date: 12/02/2023 OT Individual Time: 0803-0920 OT Individual Time Calculation (min): 77 min    Patient has met 3 of 3 short term goals.  Pt is demonstrating increased independence across all ADL categories as well as increased strength of BUE and functional use of BUE hands especially with splinting. Pt and OT adapting ways to complete ADLs for increased independence such as adding dycem to deodorant container for increased grip, AE for opening soda cans and electric toothbrush for increased energy conservation with brushing teeth. Pt set to D/C next week.  Patient continues to demonstrate the following deficits: muscle weakness and muscle joint tightness, decreased cardiorespiratoy endurance, impaired timing and sequencing, unbalanced muscle activation, ataxia, and decreased coordination, and decreased standing balance and decreased balance strategies and therefore will continue to benefit from skilled OT intervention to enhance overall performance with BADL and Reduce care partner burden.  Patient progressing toward long term goals.  Continue plan of care.  OT Short Term Goals Week 1:  OT Short Term Goal 1 (Week 1): Pt will complete grooming with SBA with AE as necessary OT Short Term Goal 1 - Progress (Week 1): Revised due to lack of progress OT Short Term Goal 2 (Week 1): Pt will complete LB dressing at Min A with AS as necessary OT Short Term Goal 2 - Progress (Week 1): Revised due to lack of progress OT Short Term Goal 3 (Week 1): Pt will complete donning/doffing wrist stabilizing u-cuff for feeding/grooming as necessary with SBA OT Short Term Goal 3 - Progress (Week 1): Revised due to lack of progress Week 2:  OT Short Term Goal 1 (Week 2): Pt will  complete LB dressing at Mod A with AE as necessary OT Short Term Goal 1 - Progress (Week 2): Met OT Short Term Goal 2 (Week 2): Pt will complete donning/doffing wrist stabilizing u-cuff for feeding/grooming as necessary with Min A OT Short Term Goal 2 - Progress (Week 2): Met OT Short Term Goal 3 (Week 2): Pt will complete grooming with Min A with AE as necessary OT Short Term Goal 3 - Progress (Week 2): Met Week 3:  OT Short Term Goal 1 (Week 3): Pt will complete LB dressing at Min A with AE as necessary OT Short Term Goal 1 - Progress (Week 3): Met OT Short Term Goal 2 (Week 3): Pt will complete toilet transfer at SBA with AD OT Short Term Goal 2 - Progress (Week 3): Met OT Short Term Goal 3 (Week 3): Pt will complete dynamic standing activities with Min A OT Short Term Goal 3 - Progress (Week 3): Met Week 4:  OT Short Term Goal 1 (Week 4): STG=LTG d/t ELOS  Skilled Therapeutic Interventions/Progress Updates:      Therapy Documentation Precautions:  Precautions Precautions: Fall Restrictions Weight Bearing Restrictions Per Provider Order: No General: "I never knew I could be this weak" Pt seated in W/C upon OT arrival, agreeable to OT.  Pain:  6/10 pain reported in low back, activity, intermittent rest breaks, distractions provided for pain management, pt reports tolerable to proceed.   Exercises: Pt completed dynamic standing activities in order to promote increased balance strategies with ADL participation, increased BUE ROM and grip/pinch. Pt completed all activities at standing level with higher level balance activities  with intermittent supported/unsupported standing while managing clothes pins in hands at RW. Pt able to reach upwards out of BOS with increased time, able to use RUE to place/remove clothes pins from yellow-black, black taking increased time to complete. Pt taking PRN rest breaks d/t fatigue.   Pt seated in W/C at end of session with W/C alarm donned, call light  within reach and 4Ps assessed.    Therapy/Group: Individual Therapy Velia Meyer, OTD, OTR/L 12/02/2023, 12:31 PM

## 2023-12-02 NOTE — Plan of Care (Signed)
  Problem: RH SAFETY Goal: RH STG ADHERE TO SAFETY PRECAUTIONS W/ASSISTANCE/DEVICE Description: STG Adhere to Safety Precautions With  cues Assistance/Device. Outcome: Progressing   Problem: RH PAIN MANAGEMENT Goal: RH STG PAIN MANAGED AT OR BELOW PT'S PAIN GOAL Description: < 4 with prns Outcome: Progressing   Problem: RH KNOWLEDGE DEFICIT SCI Goal: RH STG INCREASE KNOWLEDGE OF SELF CARE AFTER SCI Description: Patient and spouse will be able to manage care at discharge using educational resources for medications and dietary modification independently.  Outcome: Progressing

## 2023-12-02 NOTE — Progress Notes (Signed)
PROGRESS NOTE   Subjective/Complaints:  Pt reports thinks did IVIG on Thursday and Friday not Friday/Saturday- late at night- will schedule for 6pm.   Neuro still looking at doing Rituximab but waiting to see if has TB- QuantiFERON test will be back tomorrow.   And then will f/u with me about rituximab treatment.   Pt LBM yesterday.   Denies any issues other than very cold feet- explained we have him on the max dose of Cymbalta, Other nerve pain meds and max dose his BP will tolerate of Norvasc.    ROS:    Pt denies SOB, abd pain, CP, N/V/C/D, and vision changes   Cold feet (+) when feet dangle for more than 15 minutes off bed  Objective:   No results found.  Recent Labs    11/30/23 0523  WBC 11.2*  HGB 13.8  HCT 41.8  PLT 310      Recent Labs    11/30/23 0523  NA 138  K 3.9  CL 100  CO2 28  GLUCOSE 81  BUN 11  CREATININE 0.75  CALCIUM 9.3      Intake/Output Summary (Last 24 hours) at 12/02/2023 0824 Last data filed at 12/02/2023 0400 Gross per 24 hour  Intake --  Output 1650 ml  Net -1650 ml        Physical Exam: Vital Signs Blood pressure (!) 128/95, pulse 67, temperature (!) 97.5 F (36.4 C), resp. rate 18, height 5\' 7"  (1.702 m), weight 82.5 kg, SpO2 100%.       General: awake, alert, appropriate, sitting up in bed; finished tray;  NAD HENT: conjugate gaze; oropharynx moist CV: regular rate and rhythm; no JVD Pulmonary: CTA B/L; no W/R/R- good air movement GI: soft, NT, ND, (+)BS- normoactive Psychiatric: appropriate- very interactive today Neurological: Ox3   MSK today: RUE- Biceps/triceps 4+/5; WE 1/5; grip 2/5 and FA 2-/5 LLE- Biceps/triceps 4/5; WE 1/5; grip 2+/5 and FA 1/5  Last done 11/23/23 Biceps, triceps, and shoulder abduction 4+/5; WE 0/5; Grip 3/5 and on R FA 1/5 and on L 1/5 RLE- HF 4 to 4+/5 KE 4/5; DF 0/5; PF 3+/5 LLE- HF 4 to 4+/5; KE 4/5; DF 0/5 and PF  3+/5 Extremities:- no LE swelling in knees or ankles B/L  sensation:  Decreased to light touch from elbows to finger tips B/L. L>R    Assessment/Plan: 1. Functional deficits which require 3+ hours per day of interdisciplinary therapy in a comprehensive inpatient rehab setting. Physiatrist is providing close team supervision and 24 hour management of active medical problems listed below. Physiatrist and rehab team continue to assess barriers to discharge/monitor patient progress toward functional and medical goals  Care Tool:  Bathing    Body parts bathed by patient: Right arm, Left lower leg, Face, Left arm, Chest, Abdomen, Front perineal area, Buttocks, Right upper leg, Left upper leg, Right lower leg         Bathing assist Assist Level: Supervision/Verbal cueing     Upper Body Dressing/Undressing Upper body dressing   What is the patient wearing?: Pull over shirt    Upper body assist Assist Level: Moderate Assistance - Patient 50 - 74%  Lower Body Dressing/Undressing Lower body dressing      What is the patient wearing?: Underwear/pull up, Pants     Lower body assist Assist for lower body dressing: Maximal Assistance - Patient 25 - 49%     Toileting Toileting    Toileting assist Assist for toileting: Moderate Assistance - Patient 50 - 74%     Transfers Chair/bed transfer  Transfers assist     Chair/bed transfer assist level: Contact Guard/Touching assist Chair/bed transfer assistive device: Walker, Archivist   Ambulation assist      Assist level: Contact Guard/Touching assist Assistive device: Walker-rolling Max distance: 175   Walk 10 feet activity   Assist     Assist level: Contact Guard/Touching assist Assistive device: Walker-rolling   Walk 50 feet activity   Assist    Assist level: Contact Guard/Touching assist Assistive device: Walker-rolling    Walk 150 feet activity   Assist Walk 150 feet  activity did not occur:  (fatigue and imbalance)  Assist level: Contact Guard/Touching assist Assistive device: Walker-rolling    Walk 10 feet on uneven surface  activity   Assist Walk 10 feet on uneven surfaces activity did not occur: Safety/medical concerns (did not attempt d/t pt fatigue)         Wheelchair     Assist Is the patient using a wheelchair?: Yes Type of Wheelchair: Manual Wheelchair activity did not occur: Safety/medical concerns  Wheelchair assist level: Supervision/Verbal cueing Max wheelchair distance: 150    Wheelchair 50 feet with 2 turns activity    Assist    Wheelchair 50 feet with 2 turns activity did not occur:  (fatigue and imbalance)   Assist Level: Supervision/Verbal cueing   Wheelchair 150 feet activity     Assist  Wheelchair 150 feet activity did not occur:  (fatigue and imbalance)   Assist Level: Supervision/Verbal cueing   Blood pressure (!) 128/95, pulse 67, temperature (!) 97.5 F (36.4 C), resp. rate 18, height 5\' 7"  (1.702 m), weight 82.5 kg, SpO2 100%.  Medical Problem List and Plan: 1. 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  To do IVIG qweek  maintenance- for 2 days- will do outpt as well - will do Friday?Saturday again  Will schedule IVIG for Thursday and Friday evening at 6pm- d/w pharmacy 2.  Antithrombotics: -DVT/anticoagulation:  Pharmaceutical: Lovenox             -antiplatelet therapy: aspirin 81 mg daily   3. Pain Management: Tylenol as needed             -continue gabapentin 900 mg TID and 900 mg daily PRN             -continue Percocet 5/325 q 4 hours  PRN  -Continue Cymbalta 60 mg daily   -Consider trial of Lyrica to replace gabapentin   2/6- will increase Duloxetine to 90 mg daily and stop Zoloft so can do so.   2/9 pain  improving. Continue plan above  2/12- pt reports still a major issue- but will increase Duloxetine to 120 mg daily and monitor  2/14- no improvement so far, but just started yesterday with increased dose- 2-15: Ongoing severe back pain overnight.  Is using Percocet consistently every 4 hours, has not used Tylenol since 2-1.  Did use as needed gabapentin overnight, with benefit. 2/17- taking pain meds regularly.  2/19- asking ~ q4 hours for pain meds 4. Mood/Behavior/Sleep: LCSW to evaluate and provide emotional support             -continue Cymbalta 60 mg daily             -continue Zoloft 25 mg daily             -continue clonazepam 0.5 mg BID prn             -antipsychotic agents: n/a  -Patient requested increase of clonazepam due to anxiety.  He takes this medication chronically PDMP reviewed, will increase temporarily to 1 mg twice daily as needed  -1/31 pt reports anxiety is improved   2/4- pt reports anxiety is better due to Prednisone same with irritability.   2/10- appears more anxious since recovery not going as expected  2/13 -2/17- Anxiety getting worse as day of d/c gets closer  2/18- "terrified" about not getting IVIG after d/c and having to "start back at beginning"- reached out to Dr Terrace Arabia to try and assist.  5. Neuropsych/cognition: This patient is capable of making decisions on his own behalf.   6. Skin/Wound Care: Routine skin care checks   7. Fluids/Electrolytes/Nutrition: Routine Is and Os and follow-up chemistries   8: Hypertension: monitor TID and prn             -continue Avapro 300 mg daily             -continue Toprol-XL 25 mg daily  -1/31 controlled, continue current      12/02/2023    4:22 AM 12/01/2023    8:22 PM 12/01/2023    4:06 PM  Vitals with BMI  Systolic 128 110 725  Diastolic  95 75 80  Pulse 67 62 75  2/1 Will reduce losartan to 75 mg, start amlodipine 5 mg and monitor- see no. 14 BP controlled will try on amlodipine alone 2/3 BP controlled continue current regimen   2/4- BP a little elevated DBP- at 90, but otherwise well controlled- con't regimen  2/9 begin norvasc 2.5mg  daily for feet/hands per below  2/10- will try it again, but wasn't working when was on it before  2/11- no change so far, but will   2/12- BP usually controlled, however will wait on increase of Norvasc for foot coldness  2/13- slightly elevated this AM, however was soft last evening- won't make any changes yet  2/14- BP controlled- but can increase Norvasc to 5 mg over weekend if need be for foot coldness  2/17- BP controlled on Norvasc-   2/18- BP soft today- but will monitor for [facility]- otherwise, con't regimen  2/19- Explained ot pt cannot increase Norvasc because BP on soft side- and would drop his BP- so on max dose he can tolerate 9: Hypothyroidism: continue Synthroid 125 mcg daily   10: Tobacco use: cessation counseling -Nicorette gum as needed (not using)             -? 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  end date?) -follow-up Dr. Terrace Arabia -1/30 respiratory function- NIF >-40 and IS 2500, stable. Continue to monitor -2/3 NIF/IS stable   2/4- NIF's VC OK- on ICS actually- spoke with Dr Terrace Arabia- she wants [name] to do another dosing of IVIG over a total of 4 days- 2g/kg total dose over 4 days- will arrange with Pharmacy- spoke with them as well- will also increase Prednisone to 60 mg daily- since I think it was decreased- supposed to be on until f/u with Dr Terrace Arabia- needs BMP daily per pharmacy while on IVIG.   2/11- will reach out to to Dr Terrace Arabia since strength not too much better after IVIG-    2/12- haven't heard back from Dr Terrace Arabia- will wait to hear something- explained to pt that I don't know if there's another tx- at least form my  experience other than what's been tried.   2/13- waiting to have pt seen by Neuro- d/w them this AM- they will help narrow down plan.   2/14- started IVIG again x 2 days- waiting for the moment on Rituximab - 2-15: CT with resolving right upper lobe scarring, no other concerning masses for neoplastic process.  Neurology holding rituximab until QuantiFERON gold results. Unable to do Paraneoplastic panel as unreliable after IVIG.  --2/16 QuantiFERON still pending, per neurology patient is feeling 50% better after IVIG; holding off on paraneoplastic panel and rituximab  2/17- Going to do IVIG qweek for 2 days/week- will d/w Neuro when next to do IVIG- Friday?  2/18- will do IVIG in evening so doesn't interfere with therapy- on Friday/Saturday it appears unless Neuro has other recs.   2/19- changed IVIG to TH/Friday at 6pm- d/w pharmacy- also they are waiting on QuantiFERON test that should come back 2/20- to see if can do Rituximab 12: Leukocytosis: likely steroid induced, improving; no fever              -follow-up CBC with differential Thursday 1/30 and q Monday  1/3 WBC elevated but stable at 15,  likely steroid related  2/10- WBC 11.9- doing better on steroids    Latest Ref Rng & Units 11/30/2023    5:23 AM 11/23/2023    5:40 AM 11/16/2023    5:42 AM  CBC  WBC 4.0 - 10.5 K/uL 11.2  11.9  15.0   Hemoglobin 13.0 - 17.0 g/dL 09.8  11.9  14.7   Hematocrit 39.0 - 52.0 % 41.8  40.8  40.9   Platelets 150 - 400 K/uL 310  347  397     13.  Elevated ALT  -2/3 improved to 48 today  2/10- will recheck next week   2/18- ALT down to 41- is normal range  14. Cold feeling in b/l legs.  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  EXTR warm, pulses are normal, do not think he needs arterial Dopplers.  Likely autonomic, trial calcium channel blocker  2/5- so far, no improvement with Norvasc 5 mg daily-   2/6- pt emphatic that it's the worst part of his pain- he also describes feet feeling wet - but are dry and warmish to  touch- will increase Duloxetine to 90 mg daily and sotp Zoloft.   2/7- pt agreed to change- said nerve pain/cold in feet slightly better already- thinks Norvasc made him feel fatigued, so wants to stop   2/9 pt willing to try norvasc again for sx. Feels that it helped   -will start low dose 2.5mg  daily   2/10- will con't for now- pt told me  didn't help initially, but we will see  2/11- pt says yesterday cold was SO bad, it was really painful- will con't Amlodipine. 2/12- Will increase Duloxetine to 120 mg daily- is really bothering him. Wait to increase Norvasc today  2/13- no change, but has noticed when  leaves feet down at least 15 minutes, feet get colder 2/14- can increase Norvasc to 5 mg over the weekend 2/15: Cold sensitivity seems dependent on positioning, patient in bed today and not bothered, will hold further medication for now 2/16: Increase Norvasc for sensitivity to 5 mg daily 2/17- no change in cold feelings of fet- wife bringing in wool socks for him.  2/18- pt got him wool socks- pt perseverates on topic- no change so far 15. Constipation  2/15 LBM, large    I spent a total of 46   minutes on total care today- >50% coordination of care- due to d/w Pharmacy and pt and nursing- also reviewed Neuro notes and d/w Dr Terrace Arabia outpt Neuro     LOS: 22 days A FACE TO FACE EVALUATION WAS PERFORMED  Jesicca Dipierro 12/02/2023, 8:24 AM

## 2023-12-02 NOTE — Progress Notes (Signed)
Recreational Therapy Session Note  Patient Details  Name: Jonathan Luna MRN: [id number] Date of Birth: 1965/10/24 Today's Date: 12/02/2023  Pain: no c/o Goal:  Pt will actively participate in family education discussion with LRT/CTRS and pt's wife.  MET  Skilled Therapeutic Interventions/Progress Updates: Pt requested to call his wife for family education discussion.  Discussion focused on self awareness, time & energy management, home modifications, exploring community resources.  Pt is anxious for upcoming discharge and use of his time.  Pt states he needs some sort of formalized activity to be involved with that will allow him to help others while helping himself emotionally.  Discussed desired needs/outcomes, interests, skill set and general community resources.  [name], included in discussion participating in exploration of the above and inquiring about functional status at discharge as well as what the recovery period will look like and potential timeframe for recovery.  Recovery questions deferred to MDs.  Discussion included energy conservation, simple home modifications (kitchen set up /meal prep), home safety.  Reviewed use of AFOs and the recommendation that they be worn during waking hours.  Also discussed the importance of communication and its role in recovery.  Both stated understanding of the aforementioned topics and appreciated this session.  Therapy/Group: Individual Therapy  Caius Silbernagel 12/02/2023, 3:51 PM

## 2023-12-02 NOTE — Progress Notes (Addendum)
Physical Therapy Session Note  Patient Details  Name: Jonathan Luna MRN: [id number] Date of Birth: 1966-06-26  Today's Date: 12/02/2023 PT Individual Time: 4098-1191 PT Individual Time Calculation (min): 73 min   Short Term Goals: Week 3:  PT Short Term Goal 1 (Week 3): Pt will require minA with steps x2 with HR PT Short Term Goal 2 (Week 3): Pt will ambulate 150 feet with LRAD and SBA PT Short Term Goal 3 (Week 3): Pt will maintain dynamic standing balance with SBA and LRAD for duration of therapeutic activity (Wii, connect 4 game, etc). PT Short Term Goal 4 (Week 3): Pt will perform sit<>stand with supervision and LRAD  Skilled Therapeutic Interventions/Progress Updates:     Pt receive seated in w/c and agreeable to therapy session. No reports of pain at this time.  Therapeutic use of self while discussing pt's progress in IP Rehab - pt expressed frustration with feeling more "shaky" than usual this AM in OT. Pt dependently transported to therapy gym for energy conservation.   Curb Step: - 2x5" with largely CGA, minA for postural stability when descending d/t RW hitting edge of step. Pt instructed to lean with L ascending, R descending - 2x8" curb step with CGA for ascending, minA for descending. On first descent pt had decreased L eccentric control and leaned forward and to R to land on RLE. Discussed that this is unsafe and increases chance of R knee buckling, pt able to demonstrated improved L eccentric control on second round but emphasized pt should not attempt 8" curb steps when he leaves CIR d/t safety concern  Pt self propelled w/c ~50" with LE and supervision, verbal cues for energy conservation. Pt dependently transported the rest of the way to dayroom.  Pt participated in 1 round standing cornhole with RW and CGA to improve LE standing tolerance, dynamic standing balance, UE coordination and fine motor skills. Decreased wrist extension strength and coordination on L. 1 round seated  (to conserve energy) cornhole tosses with verbal cues for release timing. Pt unable to perform isolated wrist extension (pt demonstrated intermittent trace muscle activation and co-contraction of wrist and elbow flexors) and used shoulder ER to compensate.   Pt dependently transported back to room and left seated in w/c with all needs in reach.   Therapy Documentation Precautions:  Precautions Precautions: Fall Restrictions Weight Bearing Restrictions Per Provider Order: No General:     Therapy/Group: Individual Therapy  Collins [place] Truitt Leep PT, DPT  12/02/2023, 1:48 PM

## 2023-12-02 NOTE — Progress Notes (Signed)
Physical Therapy Session Note  Patient Details  Name: Jonathan Luna MRN: [id number] Date of Birth: 1966/07/28  Today's Date: 12/02/2023 PT Individual Time: 1305-1400 PT Individual Time Calculation (min): 55 min   Short Term Goals: Week 3:  PT Short Term Goal 1 (Week 3): Pt will require minA with steps x2 with HR PT Short Term Goal 2 (Week 3): Pt will ambulate 150 feet with LRAD and SBA PT Short Term Goal 3 (Week 3): Pt will maintain dynamic standing balance with SBA and LRAD for duration of therapeutic activity (Wii, connect 4 game, etc). PT Short Term Goal 4 (Week 3): Pt will perform sit<>stand with supervision and LRAD  Skilled Therapeutic Interventions/Progress Updates:      Therapy Documentation Precautions:  Precautions Precautions: Fall Restrictions Weight Bearing Restrictions Per Provider Order: No  Pt agreeable to PT session, unable to complete AFO evaluation due to [facility] being closed due to inclement weather. Re-scheduled AFO consult for 2/20 at 1 PM. Pt dependent for w/c mobility to main gym for energy conservation and performed following exercises in parallel bars for LE strength:   -resisted TKE green theraband x 15 (bilaterally)   -squats 2 x 8   -squats with green theraband to increase gluteal med activation 3 x 5   -hip abduction and squat alternating 2 x 3  PT provided mod cues for technique and form for appropriate muscle activation. Pt left at bedside with nurse present for pain medication administration 2/2 "sciatic" issues.     Therapy/Group: Individual Therapy  Truitt Leep Truitt Leep PT, DPT  12/02/2023, 3:30 PM

## 2023-12-02 NOTE — Plan of Care (Signed)
I called [facility] this morning regarding the QuantiFERON test availability. I was notified that the usual turnaround time for the test is 5 days and they expect the test results to be available tomorrow 12/03/2023. I will follow-up with the rehab team and the patient tomorrow once the QuantiFERON test is available regarding treatment with rituximab as has been discussed with his outpatient neurologist.   -- Milon Dikes, MD Neurologist [facility]

## 2023-12-03 ENCOUNTER — Inpatient Hospital Stay: Payer: 59 | Admitting: Internal Medicine

## 2023-12-03 ENCOUNTER — Ambulatory Visit: Payer: 59

## 2023-12-03 ENCOUNTER — Ambulatory Visit: Payer: 59 | Admitting: Occupational Therapy

## 2023-12-03 LAB — QUANTIFERON-TB GOLD PLUS (RQFGPL)
QuantiFERON Mitogen Value: 0.04 [IU]/mL
QuantiFERON Nil Value: 0.01 [IU]/mL
QuantiFERON TB1 Ag Value: 0.01 [IU]/mL
QuantiFERON TB2 Ag Value: 0.02 [IU]/mL

## 2023-12-03 LAB — QUANTIFERON-TB GOLD PLUS: QuantiFERON-TB Gold Plus: UNDETERMINED — AB

## 2023-12-03 NOTE — Progress Notes (Signed)
PROGRESS NOTE   Subjective/Complaints:  Pt reports he feels like he's getting weaker- because hand drifted down when sitting on table.  Also thinks getting more muscle atophy- getting "bony" on coccyx area.  Very concerned developing in Biceps as well and cannto understand when was doing so well for awhile during last admission.   No change in cold feet Bowels working regularly.   ROS: Pt denies SOB, abd pain, CP, N/V/C/D, and vision changes   Cold feet (+) when feet dangle for more than 15 minutes off bed  Objective:   No results found.  No results for input(s): "WBC", "HGB", "HCT", "PLT" in the last 72 hours.     No results for input(s): "NA", "K", "CL", "CO2", "GLUCOSE", "BUN", "CREATININE", "CALCIUM" in the last 72 hours.     Intake/Output Summary (Last 24 hours) at 12/03/2023 0756 Last data filed at 12/03/2023 0741 Gross per 24 hour  Intake 1080 ml  Output 1850 ml  Net -770 ml        Physical Exam: Vital Signs Blood pressure (!) 142/93, pulse 65, temperature (!) 97.5 F (36.4 C), temperature source Oral, resp. rate 18, height 5\' 7"  (1.702 m), weight 82.5 kg, SpO2 100%.       General: awake, alert, appropriate, upset about his function and that not getting better than is; NAD HENT: conjugate gaze; oropharynx moist CV: regular rate and rhythm; no JVD Pulmonary: CTA B/L; no W/R/R- good air movement GI: soft, NT, ND, (+)BS Psychiatric: appropriate- perseverating on atrophy and weakness- even though explained checked UE strength -was the same Neurological: Ox3  MSK today- checked again 2/20- no change in strength exam- only change was had more gross as compared to fine  tremor when checking strength : RUE- Biceps/triceps 4+/5; WE 1/5; grip 2/5 and FA 2-/5 LLE- Biceps/triceps 4/5; WE 1/5; grip 2+/5 and FA 1/5  Last done 11/23/23 Biceps, triceps, and shoulder abduction 4+/5; WE 0/5; Grip 3/5 and on R  FA 1/5 and on L 1/5 RLE- HF 4 to 4+/5 KE 4/5; DF 0/5; PF 3+/5 LLE- HF 4 to 4+/5; KE 4/5; DF 0/5 and PF 3+/5 Extremities:- no LE swelling in knees or ankles B/L  sensation:  Decreased to light touch from elbows to finger tips B/L. L>R    Assessment/Plan: 1. Functional deficits which require 3+ hours per day of interdisciplinary therapy in a comprehensive inpatient rehab setting. Physiatrist is providing close team supervision and 24 hour management of active medical problems listed below. Physiatrist and rehab team continue to assess barriers to discharge/monitor patient progress toward functional and medical goals  Care Tool:  Bathing    Body parts bathed by patient: Right arm, Left lower leg, Face, Left arm, Chest, Abdomen, Front perineal area, Buttocks, Right upper leg, Left upper leg, Right lower leg         Bathing assist Assist Level: Supervision/Verbal cueing     Upper Body Dressing/Undressing Upper body dressing   What is the patient wearing?: Pull over shirt    Upper body assist Assist Level: Moderate Assistance - Patient 50 - 74%    Lower Body Dressing/Undressing Lower body dressing      What  is the patient wearing?: Underwear/pull up, Pants     Lower body assist Assist for lower body dressing: Maximal Assistance - Patient 25 - 49%     Toileting Toileting    Toileting assist Assist for toileting: Moderate Assistance - Patient 50 - 74%     Transfers Chair/bed transfer  Transfers assist     Chair/bed transfer assist level: Contact Guard/Touching assist Chair/bed transfer assistive device: Walker, Archivist   Ambulation assist      Assist level: Contact Guard/Touching assist Assistive device: Walker-rolling Max distance: 175   Walk 10 feet activity   Assist     Assist level: Contact Guard/Touching assist Assistive device: Walker-rolling   Walk 50 feet activity   Assist    Assist level: Contact  Guard/Touching assist Assistive device: Walker-rolling    Walk 150 feet activity   Assist Walk 150 feet activity did not occur:  (fatigue and imbalance)  Assist level: Contact Guard/Touching assist Assistive device: Walker-rolling    Walk 10 feet on uneven surface  activity   Assist Walk 10 feet on uneven surfaces activity did not occur: Safety/medical concerns (did not attempt d/t pt fatigue)         Wheelchair     Assist Is the patient using a wheelchair?: Yes Type of Wheelchair: Manual Wheelchair activity did not occur: Safety/medical concerns  Wheelchair assist level: Supervision/Verbal cueing Max wheelchair distance: 150    Wheelchair 50 feet with 2 turns activity    Assist    Wheelchair 50 feet with 2 turns activity did not occur:  (fatigue and imbalance)   Assist Level: Supervision/Verbal cueing   Wheelchair 150 feet activity     Assist  Wheelchair 150 feet activity did not occur:  (fatigue and imbalance)   Assist Level: Supervision/Verbal cueing   Blood pressure (!) 142/93, pulse 65, temperature (!) 97.5 F (36.4 C), temperature source Oral, resp. rate 18, height 5\' 7"  (1.702 m), weight 82.5 kg, SpO2 100%.  Medical Problem List and Plan: 1. 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  Antithrombotics: -DVT/anticoagulation:  Pharmaceutical: Lovenox             -antiplatelet therapy: aspirin 81 mg daily   3. Pain Management:  Tylenol as needed             -continue gabapentin 900 mg TID and 900 mg daily PRN             -continue Percocet 5/325 q 4 hours PRN  -Continue Cymbalta 60 mg daily   -Consider trial of Lyrica to replace gabapentin   2/6- will increase Duloxetine to 90 mg daily and stop Zoloft so can do so.   2/9 pain improving. Continue plan above  2/12- pt reports still  a major issue- but will increase Duloxetine to 120 mg daily and monitor  2/14- no improvement so far, but just started yesterday with increased dose- 2-15: Ongoing severe back pain overnight.  Is using Percocet consistently every 4 hours, has not used Tylenol since 2-1.  Did use as needed gabapentin overnight, with benefit. 2/17- taking pain meds regularly.  2/19- asking ~ q4 hours for pain meds 4. Mood/Behavior/Sleep: LCSW to evaluate and provide emotional support             -continue Cymbalta 60 mg daily             -continue Zoloft 25 mg daily             -continue clonazepam 0.5 mg BID prn             -antipsychotic agents: n/a  -Patient requested increase of clonazepam due to anxiety.  He takes this medication chronically PDMP reviewed, will increase temporarily to 1 mg twice daily as needed  -1/31 pt reports anxiety is improved   2/4- pt reports anxiety is better due to Prednisone same with irritability.   2/10- appears more anxious since recovery not going as expected  2/13 -2/17- Anxiety getting worse as day of d/c gets closer  2/18- "terrified" about not getting IVIG after d/c and having to "start back at beginning"- reached out to Dr Terrace Arabia to try and assist.  5. Neuropsych/cognition: This patient is capable of making decisions on his own behalf.   6. Skin/Wound Care: Routine skin care checks   7. Fluids/Electrolytes/Nutrition: Routine Is and Os and follow-up chemistries   8: Hypertension: monitor TID and prn             -continue Avapro 300 mg daily             -continue Toprol-XL 25 mg daily  -1/31 controlled, continue  current      12/03/2023    3:55 AM 12/02/2023    7:46 PM 12/02/2023    4:22 AM  Vitals with BMI  Systolic 142 116 811  Diastolic 93 73 95  Pulse 65 79 67  2/1 Will reduce losartan to 75 mg, start amlodipine 5 mg and monitor- see no. 14 BP controlled will try on amlodipine alone 2/3 BP controlled continue current regimen  2/13- slightly elevated this AM, however was soft last evening- won't make any changes yet  2/14- BP controlled- but can increase Norvasc to 5 mg over weekend if need be for foot coldness  2/17- BP controlled on Norvasc-   2/18- BP soft today- but will monitor for [facility]- otherwise, con't regimen  2/19- Explained ot pt cannot increase Norvasc because BP on soft side- and would drop his BP- so on max dose he can tolerate  2/20- BP very slightly elevated- but is rare, will con't regimen 9: Hypothyroidism: continue Synthroid 125 mcg daily   10: Tobacco use: cessation counseling -Nicorette gum as needed (not using)             -? 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 end date?) -follow-up Dr. Terrace Arabia -1/30 respiratory function- NIF >-40 and IS 2500, stable. Continue to monitor -2/3 NIF/IS stable   2/4- NIF's VC OK- on ICS actually- spoke with Dr Terrace Arabia- she wants [name] to do another dosing of IVIG over a total of 4 days- 2g/kg total dose over 4 days- will arrange with Pharmacy- spoke with  them as well- will also increase Prednisone to 60 mg daily- since I think it was decreased- supposed to be on until f/u with Dr Terrace Arabia- needs BMP daily per pharmacy while on IVIG.   2/11- will reach out to to Dr Terrace Arabia since strength not too much better after IVIG-    2/12- haven't heard back from Dr Terrace Arabia- will wait to hear something- explained to pt that I don't know if there's another tx- at least form my experience other than what's been tried.   2/13- waiting to have pt seen by Neuro- d/w them this AM- they will help narrow down plan.    2/14- started IVIG again x 2 days- waiting for the moment on Rituximab - 2-15: CT with resolving right upper lobe scarring, no other concerning masses for neoplastic process.  Neurology holding rituximab until QuantiFERON gold results. Unable to do Paraneoplastic panel as unreliable after IVIG.  --2/16 QuantiFERON still pending, per neurology patient is feeling 50% better after IVIG; holding off on paraneoplastic panel and rituximab  2/17- Going to do IVIG qweek for 2 days/week- will d/w Neuro when next to do IVIG- Friday?  2/18- will do IVIG in evening so doesn't interfere with therapy- on Friday/Saturday it appears unless Neuro has other recs.   2/19- changed IVIG to TH/Friday at 6pm- d/w pharmacy- also they are waiting on QuantiFERON test that should come back 2/20- to see if can do Rituximab  2/20- test came back equivocal- per Neuro- will get regular skin TB test after d/w ID- and see what that shows and go from there 12: Leukocytosis: likely steroid induced, improving; no fever              -follow-up CBC with differential Thursday 1/30 and q Monday  1/3 WBC elevated but stable at 15,  likely steroid related  2/10- WBC 11.9- doing better on steroids    Latest Ref Rng & Units 11/30/2023    5:23 AM 11/23/2023    5:40 AM 11/16/2023    5:42 AM  CBC  WBC 4.0 - 10.5 K/uL 11.2  11.9  15.0   Hemoglobin 13.0 - 17.0 g/dL 02.7  25.3  66.4   Hematocrit 39.0 - 52.0 % 41.8  40.8  40.9   Platelets 150 - 400 K/uL 310  347  397     13.  Elevated ALT  -2/3 improved to 48 today  2/10- will recheck next week   2/18- ALT down to 41- is normal range  14. Cold feeling in b/l legs.  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  EXTR warm, pulses are normal, do not think he needs arterial Dopplers.  Likely autonomic, trial calcium channel blocker  2/5- so far, no improvement with Norvasc 5 mg daily-   2/6- pt emphatic that it's the worst part of his pain- he also describes feet feeling wet - but are dry and warmish to touch-  will increase Duloxetine to 90 mg daily and sotp Zoloft.   2/7- pt agreed to change- said nerve pain/cold in feet slightly better already- thinks Norvasc made him feel fatigued, so wants to stop   2/9 pt willing to try norvasc again for sx. Feels that it helped   -will start low dose 2.5mg  daily   2/10- will con't for now- pt told me didn't help initially, but we will see  2/11- pt says yesterday cold was SO bad, it was really painful- will con't Amlodipine. 2/12- Will increase Duloxetine to 120 mg daily- is really bothering  him. Wait to increase Norvasc today  2/13- no change, but has noticed when  leaves feet down at least 15 minutes, feet get colder 2/14- can increase Norvasc to 5 mg over the weekend 2/15: Cold sensitivity seems dependent on positioning, patient in bed today and not bothered, will hold further medication for now 2/16: Increase Norvasc for sensitivity to 5 mg daily 2/17- no change in cold feelings of fet- wife bringing in wool socks for him.  2/18- pt got him wool socks- pt perseverates on topic- no change so far 15. Constipation  2/20- LBM 2 days ago   I spent a total of 43   minutes on total care today- >50% coordination of care- due to d/w ID, Inpt Neuro and outpt Neuro, to discuss next step- I think will probably get more IVIG until they decide about Ritiximab- also prolonged d/w pt about his lack of recovery and explained tht timeline usually longer than what he's thinking    LOS: 23 days A FACE TO FACE EVALUATION WAS PERFORMED  Kaniel Kiang 12/03/2023, 7:56 AM

## 2023-12-03 NOTE — Progress Notes (Signed)
NEUROLOGY CONSULT FOLLOW UP NOTE   Date of service: December 03, 2023 Patient Name: Jonathan Luna MRN:  [id number]  Interval hx   Seen and examined. QuantiFERON test-indeterminate Discussed with ID-recommend repeating QuantiFERON test or placing skin TB test He is reluctant to use Rituxan for now-so I will order a QuantiFERON test to be repeated in case Rituxan needs to be considered in the future.   Past History   Past Medical History:  Diagnosis Date   Allergy    Anxiety    Arthritis    Cataract    Community acquired pneumonia 08/28/2023   Depression    Detached retina    Heart murmur    History of bilateral cataract extraction    Hyperlipidemia    Hypothyroidism    Low back pain    Panic attack    Panic attacks    Pneumonia of right upper lobe due to infectious organism 08/18/2023    Past Surgical History:  Procedure Laterality Date   CATARACT EXTRACTION     EYE SURGERY     IR FLUORO GUIDE CV LINE RIGHT  10/29/2023   IR US GUIDE VASC ACCESS RIGHT  10/29/2023    Family History: Family History  Problem Relation Age of Onset   Breast cancer Mother    Hypertension Father    Heart disease Father    Asthma Father    Emphysema Father        smoker for 45 years   Diabetes Brother    Hyperlipidemia Brother     Social History  reports that he has never smoked. He has been exposed to tobacco smoke. He has never used smokeless tobacco. He reports that he does not currently use alcohol. He reports that he does not use drugs.  Allergies  Allergen Reactions   Amoxil [Amoxicillin] Other (See Comments)    Pt states that it does not work for him when he had back to back pneumonia   Effexor [Venlafaxine] Diarrhea   Prozac [Fluoxetine Hcl] Diarrhea    Medications   Current Facility-Administered Medications:    acetaminophen (TYLENOL) tablet 325-650 mg, 325-650 mg, Oral, Q4H PRN, Milinda Antis, PA-C, 650 mg at 11/14/23 0827   albuterol (PROVENTIL) (2.5 MG/3ML) 0.083%  nebulizer solution 2.5 mg, 2.5 mg, Nebulization, Q4H PRN, Setzer, Lynnell Jude, PA-C   alum & mag hydroxide-simeth (MAALOX/MYLANTA) 200-200-20 MG/5ML suspension 30 mL, 30 mL, Oral, Q4H PRN, Setzer, Lynnell Jude, PA-C, 30 mL at 11/18/23 1124   amLODipine (NORVASC) tablet 5 mg, 5 mg, Oral, Daily, Elijah Birk C, DO, 5 mg at 12/03/23 0750   aspirin EC tablet 81 mg, 81 mg, Oral, Daily, Milinda Antis, PA-C, 81 mg at 12/03/23 0750   bisacodyl (DULCOLAX) EC tablet 5 mg, 5 mg, Oral, Daily PRN, Setzer, Sandra J, PA-C   calcium carbonate (TUMS - dosed in mg elemental calcium) chewable tablet 400 mg of elemental calcium, 400 mg of elemental calcium, Oral, QID PRN, Milinda Antis, PA-C, 400 mg of elemental calcium at 11/26/23 2308   clonazePAM (KLONOPIN) tablet 1 mg, 1 mg, Oral, BID PRN, Fanny Dance, MD, 1 mg at 12/02/23 2059   dextromethorphan-guaiFENesin (MUCINEX DM) 30-600 MG per 12 hr tablet 1 tablet, 1 tablet, Oral, BID, Setzer, Lynnell Jude, PA-C, 1 tablet at 12/03/23 0750   diphenhydrAMINE (BENADRYL) capsule 25 mg, 25 mg, Oral, Q6H PRN, Milinda Antis, PA-C, 25 mg at 12/01/23 2050   DULoxetine (CYMBALTA) DR capsule 120 mg, 120 mg, Oral, Daily, Lovorn,  Aundra Millet, MD, 120 mg at 12/03/23 0749   enoxaparin (LOVENOX) injection 40 mg, 40 mg, Subcutaneous, Q24H, Setzer, Lynnell Jude, PA-C, 40 mg at 12/02/23 1802   gabapentin (NEURONTIN) capsule 900 mg, 900 mg, Oral, TID, Setzer, Lynnell Jude, PA-C, 900 mg at 12/03/23 0750   gabapentin (NEURONTIN) capsule 900 mg, 900 mg, Oral, Daily PRN, Milinda Antis, PA-C, 900 mg at 11/30/23 1717   Immune Globulin 10% (PRIVIGEN) IV infusion 35 g, 0.5 g/kg (Adjusted), Intravenous, Q24 Hr x 2, Lovorn, Megan, MD   levothyroxine (SYNTHROID) tablet 125 mcg, 125 mcg, Oral, Q0600, Milinda Antis, PA-C, 125 mcg at 12/03/23 0549   metoprolol succinate (TOPROL-XL) 24 hr tablet 25 mg, 25 mg, Oral, Daily, Setzer, Lynnell Jude, PA-C, 25 mg at 12/03/23 4098   nicotine polacrilex (NICORETTE) gum 2  mg, 2 mg, Oral, PRN, Milinda Antis, PA-C   ondansetron [facility]) tablet 4 mg, 4 mg, Oral, Q6H PRN, 4 mg at 11/19/23 1401 **OR** ondansetron (ZOFRAN) injection 4 mg, 4 mg, Intravenous, Q6H PRN, Setzer, Sandra J, PA-C   oxyCODONE-acetaminophen (PERCOCET/ROXICET) 5-325 MG per tablet 1 tablet, 1 tablet, Oral, Q4H PRN, Setzer, Lynnell Jude, PA-C, 1 tablet at 12/03/23 0755   polyethylene glycol (MIRALAX / GLYCOLAX) packet 17 g, 17 g, Oral, Daily PRN, Setzer, Sandra J, PA-C   predniSONE (DELTASONE) tablet 60 mg, 60 mg, Oral, Q breakfast, Lovorn, Megan, MD, 60 mg at 12/03/23 0750   sodium phosphate (FLEET) enema 1 enema, 1 enema, Rectal, Once PRN, Milinda Antis, PA-C  Vitals   Vitals:   12/01/23 2022 12/02/23 0422 12/02/23 1946 12/03/23 0355  BP: 110/75 (!) 128/95 116/73 (!) 142/93  Pulse: 62 67 79 65  Resp: 18 18 18 18   Temp: 97.6 F (36.4 C) (!) 97.5 F (36.4 C) 98 F (36.7 C) (!) 97.5 F (36.4 C)  TempSrc: Oral  Oral Oral  SpO2: 97% 100% 94% 100%  Weight:      Height:        Body mass index is 28.49 kg/m.  Physical Exam   Constitutional: Appears well-developed and well-nourished.  Laying in bed. Psych: Affect appropriate to situation.  Eyes: No scleral injection.  Evidence of past irodotomy  HENT: No OP obstruction.  Head: Normocephalic.  Cardiovascular: Normal rate and regular rhythm.  Respiratory: Effort normal, non-labored breathing.  GI: Soft.  No distension. There is no tenderness.  Skin: WDI.   Neurologic Examination   MENTAL STATUS: AAOx3, attention intact evidence by spelling forward/backwards and per conversation. fund of knowledge appropriate.   LANG/SPEECH: Naming and repetition intact, fluent, follows 3-step commands. No dysarthria with complex phrases.   CRANIAL NERVES: II: Pupils equal and reactive, no RAPD, no visual field deficits, appropriate to past retinal detachment.  III, IV, VI: EOM intact, no gaze preference or deviation, no nystagmus. V: normal  sensation in V1, V2, and V3 segments bilaterally VII: no asymmetry, no nasolabial fold flattening VIII: normal hearing to speech IX, X: normal palatal elevation, no uvular deviation XI: 5/5 head turn and 5/5 shoulder shrug bilaterally XII: midline tongue protrusion  MOTOR: Muscle bulk: Borderline normal, tone slightly decreased, tremor with movement Mvmt Root Nerve  Muscle Right Left Comments  SA C5/6 Ax Deltoid 4+ 4+   EF C5/6 Mc Biceps 4+ 4+   EE C6/7/8 Rad Triceps 4+ 4+   WF C6/7 Med FCR 4- 4-   WE C7/8 PIN ECU 1 1   F Ab C8/T1 U ADM/FDI     HF L1/2/3 Fem  Illopsoas 4+ 4+   KE L2/3/4 Fem Quad 4+ 4+   DF L4/5 D Peron Tib Ant 2 2   PF S1/2 Tibial Grc/Sol 4 4     REFLEXES: absent  SENSORY: Cannot feel temperature below knee bilaterally.  Vibration decreased on knees and hands bilaterally.  Sensation intact to light touch throughout No hemineglect, no extinction to double sided stimulation (visual & tactile)  COORD: Dyssynergia with upper extremity strength testing (L>R). Tremors with exertions of bilateral upper extremities.  STATION: Deferred due to weakness GAIT: Deferred due to weakness   Labs/Imaging/Neurodiagnostic studies   CBC:  Recent Labs  Lab 12-25-2023 0523  WBC 11.2*  HGB 13.8  HCT 41.8  MCV 91.7  PLT 310   Basic Metabolic Panel:  Lab Results  Component Value Date   NA 138 2023-12-25   K 3.9 2023/12/25   CO2 28 December 25, 2023   GLUCOSE 81 12-25-23   BUN 11 2023/12/25   CREATININE 0.75 December 25, 2023   CALCIUM 9.3 12/25/2023   GFRNONAA >60 12-25-23   GFRAA 94 08/16/2008   Lipid Panel:  Lab Results  Component Value Date   LDLCALC 106 (H) 10/08/2023   HgbA1c:  Lab Results  Component Value Date   HGBA1C 5.3 09/22/2023   Urine Drug Screen:     Component Value Date/Time   LABOPIA NEG 09/29/2012 0830   COCAINSCRNUR Negative 08/18/2023 1114   COCAINSCRNUR NEG 09/29/2012 0830   LABBENZ POS (A) 09/29/2012 0830   AMPHETMU NEG 09/29/2012 0830      INR  Lab Results  Component Value Date   INR 1.0 08/29/2023   APTT  Lab Results  Component Value Date   APTT 32 08/29/2023   IgG 1/16: 1206 IgA 1/16: 582 TSH, T4: Within normal limits Acute hepatitis panel: Negative QuantiFERON: Indeterminate  CT Head without contrast(Personally reviewed) 10/25/2023: No acute intracranial findings.  MRI brain without contrast (Personally reviewed) 09/30/2024: Normal brain MRI.   CT Chest w/o C: Continued resolution of the right upper lobe image is seen on prior examinations. Only minimal residual scarring remains. No focal nodule or mass is seen.  ASSESSMENT   LINC RENNE is a 58 y.o. 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  He improved about 50% after first IVIG treatment but then declined again. He underwent PLEX with no significant improvement and then another round of IVIG in rehab with slight 10-20% improvement. He is on steroid but feels that it has not helped.  My outgoing colleague had discussed with Dr. Terrace Arabia with Neuromuscular team and concern for treatment refractory CIDP.  Earlier there was some discussion about Rituxan but given improvement with IVIG, that was put on hold.  Rituxan was readdressed and plan was to strongly consider it once different test comes NEGATIVE but unfortunately the QuantiFERON test came back as indeterminate warranting further workup with either repeat QuantiFERON or PPD.  I discussed in detail with the patient that the options are to continue weekly IVIG versus doing Rituxan and he seems reluctant to be doing Rituxan and would like to continue weekly IVIG's for now under the care of his outpatient neurologist.   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  For now  we will continue to do weekly infusions of IVIG 1 g/kg divided into 2 doses. Appreciate rehab team's excellent care of the patient Outpatient follow-up with Dr. Terrace Arabia in ~2 weeks  Inpatient neurology will be available as needed-please do not hesitate to call with questions. QuantiFERON testing results were discussed over the phone with ID as a curbside. Plan was discussed with Drs. Lovorn and Terrace Arabia. ___________________________________________________________________   -- Milon Dikes, MD Neurologist [facility]

## 2023-12-03 NOTE — Progress Notes (Addendum)
Patient ID: Jonathan Luna, male   DOB: [birth year], 58 y.o.   MRN: [id number]  Met with pt to discuss family education with wife on Sunday and he felt this will help in her understanding and get her questions answered. Have set up for Sunday at 1:00-3:00 pm.  10;40 Am Pt's insurance [employer] is saying he can go home on Sunday 2/23 due to mobility and how well he has progressed here. Let MD know and she will do a peer to peer with the medial director. Have contacted [facility] to set this up and the medical director will call MD either today or tomorrow during business hours. Have let pt know and can still have wife come in Sunday and if need be can discharge after family training. Will follow up with when MD hears from medical director  12:25 PM MD reports the peer to peer was approved for pt to stay until discharge date 2/25. Pt made aware of this plan,

## 2023-12-03 NOTE — Progress Notes (Cosign Needed)
Physical Therapy Weekly Progress Note  Patient Details  Name: Jonathan Luna MRN: [id number] Date of Birth: [birth year]  Beginning of progress report period: November 26, 2023 End of progress report period: December 03, 2023  Today's Date: 12/03/2023 PT Individual Time: 0801-0855 PT Individual Time Calculation (min): 54 min   Patient has met 4 of 4 short term goals.  Pt continues to progress in functional mobility and balance. Pt able to ambulate 360' with CGA and RW, and 77' with supervision with RW. Pt able to perform x2 curb steps with minA and has improved standing balance to allow for increased single limb stance. Pt continues to benefit from safety cues for technique and safe use of DME during mobility and is limited by LE ataxia, global weakness and coordination difficulties.   Patient continues to demonstrate the following deficits muscle weakness, abnormal tone, unbalanced muscle activation, ataxia, and decreased coordination, and decreased standing balance and decreased balance strategies and therefore will continue to benefit from skilled PT intervention to increase functional independence with mobility.  Patient progressing toward long term goals..  Continue plan of care.  PT Short Term Goals Week 3:  PT Short Term Goal 1 (Week 3): Pt will require minA with steps x2 with HR PT Short Term Goal 1 - Progress (Week 3): Met (pt has performed 2 curb steps with RW but not 2 consecutive stairs) PT Short Term Goal 2 (Week 3): Pt will ambulate 150 feet with LRAD and SBA PT Short Term Goal 2 - Progress (Week 3): Met PT Short Term Goal 3 (Week 3): Pt will maintain dynamic standing balance with SBA and LRAD for duration of therapeutic activity (Wii, connect 4 game, etc). PT Short Term Goal 3 - Progress (Week 3): Met PT Short Term Goal 4 (Week 3): Pt will perform sit<>stand with supervision and LRAD PT Short Term Goal 4 - Progress (Week 3): Met Week 4:  PT Short Term Goal 1 (Week 4): STG=LTG d/t  ELOS  Skilled Therapeutic Interventions/Progress Updates:  Ambulation/gait training;Balance/vestibular training;Community reintegration;Discharge planning;Disease management/prevention;DME/adaptive equipment instruction;Functional mobility training;Neuromuscular re-education;Pain management;Patient/family education;Splinting/orthotics;Stair training;Therapeutic Activities;Therapeutic Exercise;UE/LE Strength taining/ROM;UE/LE Coordination activities;Visual/perceptual remediation/compensation;Wheelchair propulsion/positioning   Therapy Documentation Precautions:  Precautions Precautions: Fall Restrictions Weight Bearing Restrictions Per Provider Order: No General: PT Amount of Missed Time (min): 20 Minutes PT Missed Treatment Reason: Other (Comment) (Neurology MD visit)  Pt received supine in bed and agreeable to therapy session. No c/o pain at this time, although pt notes sensation of swelling in R medial and lateral ankle - visualized and discussed with pt no signs of swelling or skin changes.   Discussed safety precautions and expectations for mobility upon d/c following pt's conversation with recreational therapy yesterday. Emphasize importance of AFO use during all OOB mobility, using ramps where possible, curb step sequencing, energy conservation, only preparing food not cooking, and other safety precaution. Made reminder handout in collaboration with pt for use after d/c from CIR.    Pt doffed shirt with minA and donned shirt with modA for threading head and SBA for threading BUE. Bed<>chair transfer with RW and SBA d/t pt's feeling of weakness this AM. Pt dependently transported to day room for energy conservation.  2x10 toe taps to 6" step with unilateral UE support on RW, CGA, to improve single limb stance stability. Verbal cues for glute engagement and standing up tall, visual cue of mirror for second bout, verbal cues for quiet taps. Increased hip/trunk flexion on R side potentially to  allow for passive stability  through R knee during first round, improved on second.  Pt dependently transported back to room d/t time constraints and left seated in w/c with all needs in reach.    Therapy/Group: Individual Therapy  Collins Scotland 12/03/2023, 1:36 PM

## 2023-12-03 NOTE — Progress Notes (Signed)
Occupational Therapy Session Note  Patient Details  Name: Jonathan Luna MRN: [id number] Date of Birth: 1966-07-03  Today's Date: 12/03/2023 OT Individual Time: 0930-1040 OT Individual Time Calculation (min): 70 min  Unattended Estim (Saebo) at end of session   Short Term Goals: Week 4:  OT Short Term Goal 1 (Week 4): STG=LTG d/t ELOS  Skilled Therapeutic Interventions/Progress Updates:    OT intervention with initial focus on toileting and functional amb with RW. Pt completed toileting tasks with supervision. Amb with RW to sink and stood at sink to wash hands before returning to w/c. Continued with discharge planning. BUE therex: 4# cuffs for curls with focus on correct form to maximize benefit of exercise-3x10 with LUE and 3x15 with RUE; 2.5# cuffs for punches again with emphasis on correct form to maximize benefit-3x8 with LUE and 3x12 with RUE. All exercises with unsupported sitting to engage core.   Saebo placed on RUE wrist/finger extensors at end of session  Saebo Stim One 330 pulse width 35 Hz pulse rate On 8 sec/ off 8 sec Ramp up/ down 2 sec Symmetrical Biphasic wave form  Max intensity at 500 Ohm load  OTA returned at later time to remove Saebo. No adverse reactions.   Pt remained in w/c with all needs within reach.    Therapy Documentation Precautions:  Precautions Precautions: Fall Restrictions Weight Bearing Restrictions Per Provider Order: No   Pain: Pain Assessment Pain Scale: 0-10 Pain Score: 8  Pain Type: Chronic pain Pain Location: Back Pain Descriptors / Indicators: Aching Pain Frequency: Constant Pain Onset: On-going Pain Intervention(s): Meds admin prior to therapy   Therapy/Group: Individual Therapy  Rich Brave 12/03/2023, 10:45 AM

## 2023-12-03 NOTE — Plan of Care (Signed)
  Problem: Consults Goal: RH SPINAL CORD INJURY PATIENT EDUCATION Description:  See Patient Education module for education specifics.  Outcome: Progressing   Problem: RH SAFETY Goal: RH STG ADHERE TO SAFETY PRECAUTIONS W/ASSISTANCE/DEVICE Description: STG Adhere to Safety Precautions With  cues Assistance/Device. Outcome: Progressing   Problem: RH PAIN MANAGEMENT Goal: RH STG PAIN MANAGED AT OR BELOW PT'S PAIN GOAL Description: < 4 with prns Outcome: Progressing

## 2023-12-04 NOTE — Progress Notes (Signed)
 Recreational Therapy Discharge Summary Patient Details  Name: Jonathan Luna MRN: [id number] Date of Birth: 1966/02/12 Today's Date: 12/04/2023   Comments on progress toward goals: Pt has made great progress during LOS and is expected to discharge 2/25, home with wife.  TR sessions focused primarily on pt education in regards to leisure education, activity analysis/modifications, coping/stress management, community resources, community reintegration, conservation of energy/time management and discharge planning.  Discussing included both pt and his wife and both stated understanding of the above.  Pt is discharging home today with wife at overall Mod I level.  Reasons for discharge: discharge from hospital  Follow-up: Home Health  Patient/family agrees with progress made and goals achieved: Yes  Eeva Schlosser 12/04/2023, 8:13 AM

## 2023-12-04 NOTE — Progress Notes (Signed)
Occupational Therapy Session Note  Patient Details  Name: Jonathan Luna MRN: [id number] Date of Birth: 1966-01-04  Today's Date: 12/04/2023 OT Individual Time: 1420-1530 OT Individual Time Calculation (min): 70 min    Short Term Goals: Week 4:  OT Short Term Goal 1 (Week 4): STG=LTG d/t ELOS  Skilled Therapeutic Interventions/Progress Updates:  Skilled OT intervention completed with focus on ADL retraining, functional endurance, and mobility within a shower context. Pt received seated in w/c requesting a shower and agreeable to session. BLE sensitivity reported however no explicit pain.   Pt completed all sit > stands and ambulatory transfers with CGA using RW. Min cues needed for fall prevention and body positioning.  Ambulated > TTB, with supervision for doffing UB, however required assist to doff shoes and AFOs. Waterproof cover applied to LUE IV prior to shower. Pt was able to bathe all parts with intermittent supervision, at the seated level only. Ambulatory transfer > BSC. Able to donn shirt with min A with cues provided for donning BUE then head however pt with continued difficulty. Deo applied with mod A. Demonstrated elbow on sink method to stabilize BUE ataxia. Threaded LB clothing with supervision with + time. CGA for standing balance only while pt donned shorts over hips without assist.  Ambulated to sink. Pt did fatigue easily requiring intermittent rest breaks.   Completed oral care with supervision however frequent dropping of items requiring + time for cooridnation. Transfer to EOB then mod I for bed mobility. Pt remained semi upright in bed, with bed alarm on/activated, and with all needs in reach at end of session.   Therapy Documentation Precautions:  Precautions Precautions: Fall Restrictions Weight Bearing Restrictions Per Provider Order: No    Therapy/Group: Individual Therapy  Melvyn Novas, MS, OTR/L  12/04/2023, 3:32 PM

## 2023-12-04 NOTE — Progress Notes (Signed)
Physical Therapy Session Note  Patient Details  Name: ORLEY LAWRY MRN: [id number] Date of Birth: [birth year]  Today's Date: 12/04/2023 PT Individual Time: 1015-1045 PT Individual Time Calculation (min): 30 min   Short Term Goals: Week 4:  PT Short Term Goal 1 (Week 4): STG=LTG d/t ELOS  Skilled Therapeutic Interventions/Progress Updates: Pt presented in w/c agreeable to therapy. Pt denies pain but endorses cold feet (has been sensory component through entire stay). Session focused on closed chain posterior ms recruitment and ambulation. Pt tranpsorted to main gym for energy conservation. Pt stood with CGA and ambulated 144ft with RW. Pt returned to mat and worked on mini-squats x 15 with emphasis on glute activation and hip extension. Pt also performed Sit to stand wth red theraband at waist for increased glute recruitment x 15. Pt then ambulated an additional 143ft in same manner as prior. Pt transported back to room at end of session and remained in w/c with call bell within reach and needs met.      Therapy Documentation Precautions:  Precautions Precautions: Fall Restrictions Weight Bearing Restrictions Per Provider Order: No General:   Vital Signs: Therapy Vitals Temp: 98 F (36.7 C) Temp Source: Oral Pulse Rate: 95 Resp: 20 BP: (!) 130/91 Patient Position (if appropriate): Sitting Oxygen Therapy SpO2: 97 % O2 Device: Room Air      Therapy/Group: Individual Therapy  Rhyder Bratz 12/04/2023, 4:24 PM

## 2023-12-04 NOTE — Progress Notes (Signed)
Physical Therapy Session Note  Patient Details  Name: Jonathan Luna MRN: [id number] Date of Birth: 1966-08-24  Today's Date: 12/04/2023 PT Individual Time: 0800-0845, 1100-1200 PT Individual Time Calculation (min): 45 min, 60 min   Short Term Goals: Week 4:  PT Short Term Goal 1 (Week 4): STG=LTG d/t ELOS  Skilled Therapeutic Interventions/Progress Updates:    Session 1: pt received in bed and agreeable to therapy. Pt reports chronic low back pain, no intervention required.   Bed mobility with supervision for safety, nearing independence. Donned socks and shoes/AFOs with tot a for time. While nsg prepared and administered morning meds, pt performed 3 x 10 Sit to stand to RW with CGA-SBA, with good hand placement and speed for strength and endurance. Progressed to 1 set of x 12 box squats to w/c for progression, unable to fully tap to chair (~4" higher than chair), pt with x 3 LOB/bail out of movement with uncontrolled sit to chair, improved with cue to fight for balance.   Pt then ambulated >250 ft (from room, around nsg station and back to room) with 1 seated rest break. Note improving hyperextension thrust. Therapist provided CGA but no LOB or knee buckling noted. Pt remained in w/c at end of session with needs in reach.    Session 2: Pt seated in w/c on arrival and agreeable to therapy. Reports chronic LBP that does require intervention at this time. Pt ambulated to therapy gym with CGA for AFO consult. Discussed mild hyperextension R>L, but that heel wedge placed yesterday seems to be helping. Does not need stiffer AFO at this time.   Transitioned to mat table and pt stepped up into tall kneeling on mat with CGA and VC. Pt performed 3 x 10 tall kneeling squats with CGA and VC to achieve max hip extension. Progressed to tall kneel<>half kneel, 1 x 4 and 2 x 6 for endurance and coordination with functional movement task. Rest breaks required d/t high effort of these activities.   Pt propelled  back to room alternating UE and LE propulsion, remained in w/c with needs in reach.  Therapy Documentation Precautions:  Precautions Precautions: Fall Restrictions Weight Bearing Restrictions Per Provider Order: No General:       Therapy/Group: Individual Therapy  Juluis Rainier 12/04/2023, 8:32 AM

## 2023-12-04 NOTE — Progress Notes (Signed)
PROGRESS NOTE   Subjective/Complaints:  Pt reports he is interested in  wants Rituximab-   Feels like hands swollen and R foot swollen- and feels like will burst.   Got IVIG last night-   LBM yesterday  ROS:  Pt denies SOB, abd pain, CP, N/V/C/D, and vision changes   Cold feet (+) when feet dangle for more than 15 minutes off bed  Objective:   No results found.  No results for input(s): "WBC", "HGB", "HCT", "PLT" in the last 72 hours.     No results for input(s): "NA", "K", "CL", "CO2", "GLUCOSE", "BUN", "CREATININE", "CALCIUM" in the last 72 hours.     Intake/Output Summary (Last 24 hours) at 12/04/2023 0834 Last data filed at 12/04/2023 0700 Gross per 24 hour  Intake 716 ml  Output 2000 ml  Net -1284 ml        Physical Exam: Vital Signs Blood pressure (!) 141/90, pulse 64, temperature 98 F (36.7 C), resp. rate 16, height 5\' 7"  (1.702 m), weight 82.5 kg, SpO2 97%.        General: awake, alert, appropriate, sitting up at EOB; NAD HENT: conjugate gaze; oropharynx moist CV: regular rate and rhythm; no JVD Pulmonary: CTA B/L; no W/R/R- good air movement GI: soft, NT, ND, (+)BS Psychiatric: appropriate-still anxious Neurological: Ox3   MSK today- checked again 2/20- no change in strength exam- only change was had more gross as compared to fine  tremor when checking strength : RUE- Biceps/triceps 4+/5; WE 1/5; grip 2/5 and FA 2-/5 LLE- Biceps/triceps 4/5; WE 1/5; grip 2+/5 and FA 1/5  Last done 11/23/23 Biceps, triceps, and shoulder abduction 4+/5; WE 0/5; Grip 3/5 and on R FA 1/5 and on L 1/5 RLE- HF 4 to 4+/5 KE 4/5; DF 0/5; PF 3+/5 LLE- HF 4 to 4+/5; KE 4/5; DF 0/5 and PF 3+/5 Extremities:- no LE swelling in knees or ankles B/L  sensation:  Decreased to light touch from elbows to finger tips B/L. L>R    Assessment/Plan: 1. Functional deficits which require 3+ hours per day of  interdisciplinary therapy in a comprehensive inpatient rehab setting. Physiatrist is providing close team supervision and 24 hour management of active medical problems listed below. Physiatrist and rehab team continue to assess barriers to discharge/monitor patient progress toward functional and medical goals  Care Tool:  Bathing    Body parts bathed by patient: Right arm, Left lower leg, Face, Left arm, Chest, Abdomen, Front perineal area, Buttocks, Right upper leg, Left upper leg, Right lower leg         Bathing assist Assist Level: Supervision/Verbal cueing     Upper Body Dressing/Undressing Upper body dressing   What is the patient wearing?: Pull over shirt    Upper body assist Assist Level: Moderate Assistance - Patient 50 - 74%    Lower Body Dressing/Undressing Lower body dressing      What is the patient wearing?: Underwear/pull up, Pants     Lower body assist Assist for lower body dressing: Maximal Assistance - Patient 25 - 49%     Toileting Toileting    Toileting assist Assist for toileting: Supervision/Verbal cueing     Transfers  Chair/bed transfer  Transfers assist     Chair/bed transfer assist level: Supervision/Verbal cueing Chair/bed transfer assistive device: Walker, Archivist   Ambulation assist      Assist level: Supervision/Verbal cueing (close supervision) Assistive device: Walker-rolling Max distance: 360   Walk 10 feet activity   Assist     Assist level: Supervision/Verbal cueing Assistive device: Walker-rolling   Walk 50 feet activity   Assist    Assist level: Contact Guard/Touching assist Assistive device: Walker-rolling    Walk 150 feet activity   Assist Walk 150 feet activity did not occur:  (fatigue and imbalance)  Assist level: Supervision/Verbal cueing Assistive device: Walker-rolling    Walk 10 feet on uneven surface  activity   Assist Walk 10 feet on uneven surfaces activity  did not occur: Safety/medical concerns (did not attempt d/t pt fatigue)         Wheelchair     Assist Is the patient using a wheelchair?: Yes Type of Wheelchair: Manual Wheelchair activity did not occur: Safety/medical concerns  Wheelchair assist level: Supervision/Verbal cueing Max wheelchair distance: 150    Wheelchair 50 feet with 2 turns activity    Assist    Wheelchair 50 feet with 2 turns activity did not occur:  (fatigue and imbalance)   Assist Level: Supervision/Verbal cueing   Wheelchair 150 feet activity     Assist  Wheelchair 150 feet activity did not occur:  (fatigue and imbalance)   Assist Level: Supervision/Verbal cueing   Blood pressure (!) 141/90, pulse 64, temperature 98 F (36.7 C), resp. rate 16, height 5\' 7"  (1.702 m), weight 82.5 kg, SpO2 97%.  Medical Problem List and Plan: 1. 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  Antithrombotics: -DVT/anticoagulation:  Pharmaceutical: Lovenox             -antiplatelet therapy: aspirin 81 mg daily   3. Pain Management: Tylenol as needed             -continue gabapentin 900 mg TID and 900 mg daily PRN             -continue Percocet 5/325 q 4 hours PRN  -Continue Cymbalta 60 mg daily   -Consider trial of Lyrica to replace gabapentin   2/6- will increase Duloxetine to 90 mg daily and stop Zoloft so can do so.   2/9  pain improving. Continue plan above  2/12- pt reports still a major issue- but will increase Duloxetine to 120 mg daily and monitor  2/14- no improvement so far, but just started yesterday with increased dose- 2-15: Ongoing severe back pain overnight.  Is using Percocet consistently every 4 hours, has not used Tylenol since 2-1.  Did use as needed gabapentin overnight, with benefit. 2/17- taking pain meds regularly.  2/19- asking ~ q4 hours for pain meds 4. Mood/Behavior/Sleep: LCSW to evaluate and provide emotional support             -continue Cymbalta 60 mg daily             -continue Zoloft 25 mg daily             -continue clonazepam 0.5 mg BID prn             -antipsychotic agents: n/a  -Patient requested increase of clonazepam due to anxiety.  He takes this medication chronically PDMP reviewed, will increase temporarily to 1 mg twice daily as needed  -1/31 pt reports anxiety is improved   2/4- pt reports anxiety is better due to Prednisone same with irritability.   2/10- appears more anxious since recovery not going as expected  2/13 -2/17- Anxiety getting worse as day of d/c gets closer  2/18- "terrified" about not getting IVIG after d/c and having to "start back at beginning"- reached out to Dr Terrace Arabia to try and assist. 2/21- pt asking for Rituximab once gets out of rehab  5. Neuropsych/cognition: This patient is capable of making decisions on his own behalf.   6. Skin/Wound Care: Routine skin care checks   7. Fluids/Electrolytes/Nutrition: Routine Is and Os and follow-up chemistries   8: Hypertension: monitor TID and prn             -continue Avapro 300 mg daily             -continue Toprol-XL 25 mg daily  -1/31 controlled, continue current      12/04/2023    4:11 AM 12/03/2023   10:03 PM 12/03/2023    9:32 PM  Vitals with BMI  Systolic 141 95 108  Diastolic 90 66 74  Pulse 64 71 72  2/1 Will reduce losartan to 75 mg, start amlodipine 5 mg and monitor- see no. 14 BP  controlled will try on amlodipine alone 2/3 BP controlled continue current regimen  2/13- slightly elevated this AM, however was soft last evening- won't make any changes yet  2/14- BP controlled- but can increase Norvasc to 5 mg over weekend if need be for foot coldness  2/17- BP controlled on Norvasc-   2/18- BP soft today- but will monitor for [facility]- otherwise, con't regimen  2/19- Explained ot pt cannot increase Norvasc because BP on soft side- and would drop his BP- so on max dose he can tolerate  2/20-2/21 BP very slightly elevated- but is rare, will con't regimen 9: Hypothyroidism: continue Synthroid 125 mcg daily   10: Tobacco use: cessation counseling -Nicorette gum as needed (not using)             -? 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 end date?) -follow-up Dr. Terrace Arabia -1/30 respiratory function- NIF >-40 and IS 2500, stable. Continue to monitor -2/3 NIF/IS stable   2/4- NIF's VC OK- on ICS actually- spoke with Dr Terrace Arabia- she wants [name] to do another dosing of IVIG over a total of 4 days- 2g/kg total dose over 4 days- will arrange with Pharmacy- spoke with them as well- will also increase Prednisone to 60 mg daily- since I think it was decreased- supposed to be on until f/u with Dr Terrace Arabia- needs BMP daily per pharmacy while on IVIG.   2/11- will  reach out to to Dr Terrace Arabia since strength not too much better after IVIG-    2/12- haven't heard back from Dr Terrace Arabia- will wait to hear something- explained to pt that I don't know if there's another tx- at least form my experience other than what's been tried.   2/13- waiting to have pt seen by Neuro- d/w them this AM- they will help narrow down plan.   2/14- started IVIG again x 2 days- waiting for the moment on Rituximab - 2-15: CT with resolving right upper lobe scarring, no other concerning masses for neoplastic process.  Neurology holding rituximab until QuantiFERON gold results. Unable to  do Paraneoplastic panel as unreliable after IVIG.  --2/16 QuantiFERON still pending, per neurology patient is feeling 50% better after IVIG; holding off on paraneoplastic panel and rituximab  2/17- Going to do IVIG qweek for 2 days/week- will d/w Neuro when next to do IVIG- Friday?  2/18- will do IVIG in evening so doesn't interfere with therapy- on Friday/Saturday it appears unless Neuro has other recs.   2/19- changed IVIG to TH/Friday at 6pm- d/w pharmacy- also they are waiting on QuantiFERON test that should come back 2/20- to see if can do Rituximab  2/20- test came back equivocal- per Neuro- will get regular skin TB test after d/w ID- and see what that shows and go from there  2/1- reordered Quantiferon- and pt interested in Rituximab as well 12: Leukocytosis: likely steroid induced, improving; no fever              -follow-up CBC with differential Thursday 1/30 and q Monday  1/3 WBC elevated but stable at 15,  likely steroid related  2/10- WBC 11.9- doing better on steroids    Latest Ref Rng & Units 11/30/2023    5:23 AM 11/23/2023    5:40 AM 11/16/2023    5:42 AM  CBC  WBC 4.0 - 10.5 K/uL 11.2  11.9  15.0   Hemoglobin 13.0 - 17.0 g/dL 16.1  09.6  04.5   Hematocrit 39.0 - 52.0 % 41.8  40.8  40.9   Platelets 150 - 400 K/uL 310  347  397     13.  Elevated ALT  -2/3 improved to 48 today  2/10- will recheck next week   2/18- ALT down to 41- is normal range  14. Cold feeling in b/l legs.  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  EXTR warm, pulses are normal, do not think he needs arterial Dopplers.  Likely autonomic, trial calcium channel blocker  2/5- so far, no improvement with Norvasc 5 mg daily-   2/6- pt emphatic that it's the worst part of his pain- he also describes feet feeling wet - but are dry and warmish to touch- will increase Duloxetine to 90 mg daily and sotp Zoloft.   2/7- pt agreed to change- said nerve pain/cold in feet slightly better already- thinks Norvasc made him feel fatigued,  so wants to stop   2/9 pt willing to try norvasc again for sx. Feels that it helped   -will start low dose 2.5mg  daily   2/10- will con't for now- pt told me didn't help initially, but we will see  2/11- pt says yesterday cold was SO bad, it was really painful- will con't Amlodipine. 2/12- Will increase Duloxetine to 120 mg daily- is really bothering him. Wait to increase Norvasc today  2/13- no change, but has noticed when  leaves feet down at least 15 minutes, feet get colder 2/14- can  increase Norvasc to 5 mg over the weekend 2/15: Cold sensitivity seems dependent on positioning, patient in bed today and not bothered, will hold further medication for now 2/16: Increase Norvasc for sensitivity to 5 mg daily 2/17- no change in cold feelings of fet- wife bringing in wool socks for him.  2/18- pt got him wool socks- pt perseverates on topic- no change so far 15. Constipation  2/20- LBM 2 days ago  2/21- LBM yesterday   I spent a total of 35   minutes on total care today- >50% coordination of care- due to prolonged time with pt- d/w him about Rituximab, answering questions about pathophysiology of meds and immunology- and we discussed insurance approved him to stay til 2/25    LOS: 24 days A FACE TO FACE EVALUATION WAS PERFORMED  Mickey Hebel 12/04/2023, 8:34 AM

## 2023-12-04 NOTE — Progress Notes (Signed)
Recreational Therapy Session Note  Patient Details  Name: Jonathan Luna MRN: [id number] Date of Birth: 1966/01/21 Today's Date: 12/04/2023 Pt will be provided with verbal education on community reintegration.  MET Pain: no c/o Skilled Therapeutic Interventions/Progress Updates: Session focused on community reintegration education.  Discussion included community mobility, identifying/negotiating obstacles, time management, energy conservation, and safety.  Pt stated understanding.  Therapy/Group: Individual Therapy [name] 12/04/2023, 8:12 AM

## 2023-12-05 DIAGNOSIS — K5900 Constipation, unspecified: Secondary | ICD-10-CM

## 2023-12-05 MED ORDER — SULFAMETHOXAZOLE-TRIMETHOPRIM 800-160 MG PO TABS
1.0000 | ORAL_TABLET | Freq: Every day | ORAL | Status: DC
  Administered 2023-12-05 – 2023-12-08 (×4): 1 via ORAL
  Filled 2023-12-05 (×4): qty 1

## 2023-12-05 NOTE — Plan of Care (Signed)
 Added bactrim for PCP ppx since he is on long term steroids.  Milon Dikes, MD

## 2023-12-05 NOTE — Progress Notes (Signed)
 PROGRESS NOTE   Subjective/Complaints:  Patient reports again has hands feel like they are swollen, feel like they are going to burst.  He also feels like he is wearing a brace on his right foot.  Neurology added Bactrim for PCP prophylaxis due to long-term steroid use. ROS:  Pt denies risk, SOB, abd pain, CP, N/V/C/D, and vision changes   Cold feet -continued  Objective:   No results found.  No results for input(s): "WBC", "HGB", "HCT", "PLT" in the last 72 hours.     No results for input(s): "NA", "K", "CL", "CO2", "GLUCOSE", "BUN", "CREATININE", "CALCIUM" in the last 72 hours.     Intake/Output Summary (Last 24 hours) at 12/05/2023 1619 Last data filed at 12/05/2023 0745 Gross per 24 hour  Intake 958 ml  Output 1700 ml  Net -742 ml        Physical Exam: Vital Signs Blood pressure 122/83, pulse 82, temperature 97.8 F (36.6 C), temperature source Oral, resp. rate 18, height 5\' 7"  (1.702 m), weight 82.5 kg, SpO2 95%.        General: awake, alert, appropriate, sitting up at EOB; NAD HENT: conjugate gaze; oropharynx moist CV: regular rate and rhythm; no JVD Pulmonary: CTA B/L; no W/R/R- good air movement, non-labored GI: soft, NT, ND, (+)BS Psychiatric: appropriate-still anxious Neurological: Ox3 Extremities: No significant edema noted in his hands. Wearing wool socks on his feet MSK  RUE- Biceps/triceps 4+/5; WE 1/5; grip 2/5 and FA 2-/5 LLE- Biceps/triceps 4/5; WE 1/5; grip 2+/5 and FA 1/5 Exam stable 2/22  Last done 11/23/23 Biceps, triceps, and shoulder abduction 4+/5; WE 0/5; Grip 3/5 and on R FA 1/5 and on L 1/5 RLE- HF 4 to 4+/5 KE 4/5; DF 0/5; PF 3+/5 LLE- HF 4 to 4+/5; KE 4/5; DF 0/5 and PF 3+/5 Extremities:- no LE swelling in knees or ankles B/L  sensation:  Decreased to light touch from elbows to finger tips B/L. L>R    Assessment/Plan: 1. Functional deficits which require 3+  hours per day of interdisciplinary therapy in a comprehensive inpatient rehab setting. Physiatrist is providing close team supervision and 24 hour management of active medical problems listed below. Physiatrist and rehab team continue to assess barriers to discharge/monitor patient progress toward functional and medical goals  Care Tool:  Bathing    Body parts bathed by patient: Right arm, Left lower leg, Face, Left arm, Chest, Abdomen, Front perineal area, Buttocks, Right upper leg, Left upper leg, Right lower leg         Bathing assist Assist Level: Supervision/Verbal cueing     Upper Body Dressing/Undressing Upper body dressing   What is the patient wearing?: Pull over shirt    Upper body assist Assist Level: Moderate Assistance - Patient 50 - 74%    Lower Body Dressing/Undressing Lower body dressing      What is the patient wearing?: Underwear/pull up, Pants     Lower body assist Assist for lower body dressing: Maximal Assistance - Patient 25 - 49%     Toileting Toileting    Toileting assist Assist for toileting: Supervision/Verbal cueing     Transfers Chair/bed transfer  Transfers assist  Chair/bed transfer assist level: Supervision/Verbal cueing Chair/bed transfer assistive device: Walker, Archivist   Ambulation assist      Assist level: Supervision/Verbal cueing (close supervision) Assistive device: Walker-rolling Max distance: 360   Walk 10 feet activity   Assist     Assist level: Supervision/Verbal cueing Assistive device: Walker-rolling   Walk 50 feet activity   Assist    Assist level: Contact Guard/Touching assist Assistive device: Walker-rolling    Walk 150 feet activity   Assist Walk 150 feet activity did not occur:  (fatigue and imbalance)  Assist level: Supervision/Verbal cueing Assistive device: Walker-rolling    Walk 10 feet on uneven surface  activity   Assist Walk 10 feet on uneven  surfaces activity did not occur: Safety/medical concerns (did not attempt d/t pt fatigue)         Wheelchair     Assist Is the patient using a wheelchair?: Yes Type of Wheelchair: Manual Wheelchair activity did not occur: Safety/medical concerns  Wheelchair assist level: Supervision/Verbal cueing Max wheelchair distance: 150    Wheelchair 50 feet with 2 turns activity    Assist    Wheelchair 50 feet with 2 turns activity did not occur:  (fatigue and imbalance)   Assist Level: Supervision/Verbal cueing   Wheelchair 150 feet activity     Assist  Wheelchair 150 feet activity did not occur:  (fatigue and imbalance)   Assist Level: Supervision/Verbal cueing   Blood pressure 122/83, pulse 82, temperature 97.8 F (36.6 C), temperature source Oral, resp. rate 18, height 5\' 7"  (1.702 m), weight 82.5 kg, SpO2 95%.  Medical Problem List and Plan: 1. 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             -will be on Prednisone 60 mg daily until f/u with Neurology outpatient             -patient may  shower             -ELOS/Goals: 2-3 weeks- supervision to mod i  -d/c 2/25  D/w Dr Terrace Arabia about making sure gets IVIG after d/c  Con't CIR PT and OT  Rituximab on hold right now- Quantiferon was equivocal- might place TB test  To do IVIG qweek  maintenance- for 2 days- will do outpt as well - will do Friday?Saturday again  Will schedule IVIG for Thursday and Friday evening at 6pm- d/w pharmacy  2/21- got a dose last night- no side effects except maybe feeling swollen  2/22 continues to feel like his hands are swollen.  Per neurology, patient to defer Rituxan discussion to outpatient.  Weekly IVIG.  Bactrim started for PCP prophylaxis 2.  Antithrombotics: -DVT/anticoagulation:  Pharmaceutical: Lovenox             -antiplatelet therapy: aspirin 81 mg daily   3. Pain Management: Tylenol as needed             -continue gabapentin 900 mg TID and 900 mg daily PRN              -continue Percocet 5/325 q 4 hours PRN  -Continue Cymbalta 60 mg daily   -Consider trial of Lyrica to replace gabapentin   2/6- will increase Duloxetine to 90 mg daily and stop Zoloft so can do so.   2/9 pain improving. Continue plan above  2/12- pt reports still a major issue- but will increase Duloxetine to 120 mg daily and monitor  2/14- no improvement so far, but just started yesterday with  increased dose- 2-15: Ongoing severe back pain overnight.  Is using Percocet consistently every 4 hours, has not used Tylenol since 2-1.  Did use as needed gabapentin overnight, with benefit. 2/17- taking pain meds regularly.  2/19- asking ~ q4 hours for pain meds 2/22 has not been using his as needed gabapentin dose for neuropathic pain, discuss option with him 4. Mood/Behavior/Sleep: LCSW to evaluate and provide emotional support             -continue Cymbalta 60 mg daily             -continue Zoloft 25 mg daily             -continue clonazepam 0.5 mg BID prn             -antipsychotic agents: n/a  -Patient requested increase of clonazepam due to anxiety.  He takes this medication chronically PDMP reviewed, will increase temporarily to 1 mg twice daily as needed  -1/31 pt reports anxiety is improved   2/4- pt reports anxiety is better due to Prednisone same with irritability.   2/10- appears more anxious since recovery not going as expected  2/13 -2/17- Anxiety getting worse as day of d/c gets closer  2/18- "terrified" about not getting IVIG after d/c and having to "start back at beginning"- reached out to Dr Terrace Arabia to try and assist. 2/21- pt asking for Rituximab once gets out of rehab  5. Neuropsych/cognition: This patient is capable of making decisions on his own behalf.   6. Skin/Wound Care: Routine skin care checks   7. Fluids/Electrolytes/Nutrition: Routine Is and Os and follow-up chemistries   8: Hypertension: monitor TID and prn             -continue Avapro 300 mg daily              -continue Toprol-XL 25 mg daily  -1/31 controlled, continue current      12/05/2023    3:21 PM 12/05/2023    3:18 AM 12/04/2023    8:51 PM  Vitals with BMI  Systolic 122 146 413  Diastolic 83 88 84  Pulse 82 66 80  2/1 Will reduce losartan to 75 mg, start amlodipine 5 mg and monitor- see no. 14 BP controlled will try on amlodipine alone 2/3 BP controlled continue current regimen  2/13- slightly elevated this AM, however was soft last evening- won't make any changes yet  2/14- BP controlled- but can increase Norvasc to 5 mg over weekend if need be for foot coldness  2/17- BP controlled on Norvasc-   2/18- BP soft today- but will monitor for [facility]- otherwise, con't regimen  2/19- Explained ot pt cannot increase Norvasc because BP on soft side- and would drop his BP- so on max dose he can tolerate  2/20-2/21 BP very slightly elevated- but is rare, will con't regimen  2/22 BP overall controlled, continue current regimen 9: Hypothyroidism: continue Synthroid 125 mcg daily   10: Tobacco use: cessation counseling -Nicorette gum as needed (not using)             -? 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 end date?) -follow-up Dr. Terrace Arabia -1/30 respiratory function- NIF >-40 and IS 2500, stable. Continue to monitor -2/3 NIF/IS stable   2/4- NIF's VC OK- on ICS actually- spoke with Dr Terrace Arabia- she wants [name] to do another dosing of IVIG over a total of 4 days- 2g/kg  total dose over 4 days- will arrange with Pharmacy- spoke with them as well- will also increase Prednisone to 60 mg daily- since I think it was decreased- supposed to be on until f/u with Dr Terrace Arabia- needs BMP daily per pharmacy while on IVIG.   2/11- will reach out to to Dr Terrace Arabia since strength not too much better after IVIG-    2/12- haven't heard back from Dr Terrace Arabia- will wait to hear something- explained to pt that I don't know if there's another tx- at least form my experience other than  what's been tried.   2/13- waiting to have pt seen by Neuro- d/w them this AM- they will help narrow down plan.   2/14- started IVIG again x 2 days- waiting for the moment on Rituximab - 2-15: CT with resolving right upper lobe scarring, no other concerning masses for neoplastic process.  Neurology holding rituximab until QuantiFERON gold results. Unable to do Paraneoplastic panel as unreliable after IVIG.  --2/16 QuantiFERON still pending, per neurology patient is feeling 50% better after IVIG; holding off on paraneoplastic panel and rituximab  2/17- Going to do IVIG qweek for 2 days/week- will d/w Neuro when next to do IVIG- Friday?  2/18- will do IVIG in evening so doesn't interfere with therapy- on Friday/Saturday it appears unless Neuro has other recs.   2/19- changed IVIG to TH/Friday at 6pm- d/w pharmacy- also they are waiting on QuantiFERON test that should come back 2/20- to see if can do Rituximab  2/20- test came back equivocal- per Neuro- will get regular skin TB test after d/w ID- and see what that shows and go from there  2/1- reordered Quantiferon- and pt interested in Rituximab as well  2/2 quantiferon pending 12: Leukocytosis: likely steroid induced, improving; no fever              -follow-up CBC with differential Thursday 1/30 and q Monday  1/3 WBC elevated but stable at 15,  likely steroid related  2/10- WBC 11.9- doing better on steroids    Latest Ref Rng & Units 11/30/2023    5:23 AM 11/23/2023    5:40 AM 11/16/2023    5:42 AM  CBC  WBC 4.0 - 10.5 K/uL 11.2  11.9  15.0   Hemoglobin 13.0 - 17.0 g/dL 16.1  09.6  04.5   Hematocrit 39.0 - 52.0 % 41.8  40.8  40.9   Platelets 150 - 400 K/uL 310  347  397     13.  Elevated ALT  -2/3 improved to 48 today  2/10- will recheck next week   2/18- ALT down to 41- is normal range  14. Cold feeling in b/l legs.  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  EXTR warm, pulses are normal, do not think he needs arterial Dopplers.  Likely autonomic, trial  calcium channel blocker  2/5- so far, no improvement with Norvasc 5 mg daily-   2/6- pt emphatic that it's the worst part of his pain- he also describes feet feeling wet - but are dry and warmish to touch- will increase Duloxetine to 90 mg daily and sotp Zoloft.   2/7- pt agreed to change- said nerve pain/cold in feet slightly better already- thinks Norvasc made him feel fatigued, so wants to stop   2/9 pt willing to try norvasc again for sx. Feels that it helped   -will start low dose 2.5mg  daily   2/10- will con't for now- pt told me didn't help initially, but we will see  2/11- pt says yesterday cold was SO bad, it was really painful- will con't Amlodipine. 2/12- Will increase Duloxetine to 120 mg daily- is really bothering him. Wait to increase Norvasc today  2/13- no change, but has noticed when  leaves feet down at least 15 minutes, feet get colder 2/14- can increase Norvasc to 5 mg over the weekend 2/15: Cold sensitivity seems dependent on positioning, patient in bed today and not bothered, will hold further medication for now 2/16: Increase Norvasc for sensitivity to 5 mg daily 2/17- no change in cold feelings of fet- wife bringing in wool socks for him.  2/18- pt got him wool socks- pt perseverates on topic- no change so far 15. Constipation  2/20- LBM 2 days ago  2/21- LBM yesterday  2/22 LBM today- continue to monitor      LOS: 25 days A FACE TO FACE EVALUATION WAS PERFORMED  Fanny Dance 12/05/2023, 4:19 PM

## 2023-12-06 NOTE — Progress Notes (Signed)
 Occupational Therapy Session Note  Patient Details  Name: Jonathan Luna MRN: [id number] Date of Birth: [birth year]  Today's Date: 12/06/2023 OT Individual Time: 1300-1355 OT Individual Time Calculation (min): 55 min    Short Term Goals: Week 4:  OT Short Term Goal 1 (Week 4): STG=LTG d/t ELOS  Skilled Therapeutic Interventions/Progress Updates:      Therapy Documentation Precautions:  Precautions Precautions: Fall Restrictions Weight Bearing Restrictions Per Provider Order: No General: "[name]!" Pt seated in W/C upon OT arrival, agreeable to OT. Wife Clydie Braun present for family ed.  Pain: no pain reported  Other Treatments: Patient, pt wife, Huntley Dec OT present at family edu. Pt and care partner educated on current functional level of assistance for ADLs and functional mobility/transfers. Pt educated specifically on CARE tool levels of assistance provided at current status and level of assistance for OT goals once at D/C. 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 Pt wife understanding of all topics discussed. Pt set to D/C on 2/25.   Pt seated in W/C at end of session with W/C alarm donned, call light within reach and 4Ps assessed.    Therapy/Group: Individual Therapy  Velia Meyer, OTD, OTR/L 12/06/2023, 4:38 PM

## 2023-12-06 NOTE — Progress Notes (Signed)
 Physical Therapy Session Note  Patient Details  Name: Jonathan Luna MRN: [id number] Date of Birth: 03/07/1966  Today's Date: 12/06/2023 PT Individual Time: 7829-5621 + 1400-1450 PT Individual Time Calculation (min): 29 min + 50 min  Short Term Goals: Week 4:  PT Short Term Goal 1 (Week 4): STG=LTG d/t ELOS  Skilled Therapeutic Interventions/Progress Updates:    SESSION 1: Pt presents in room in bed, agreeable to PT. Pt does not report pain at this time but reporting feeling like he has a "metal bar" in his R ankle that started when he began taking IVIG medication. Session focused on gait training and NMR for dynamic standing balance with position changes as well as muscle fiber recruitment needed for functional ambulation and transfers.  Pt completes bed mobility with supervision approaching modI. Therapist dons shoes and AFOs dependently for time management. Pt completes sit>stand to RW with CGA and pt ambulates with RW to day room ~150' with CGA with pt demonstrating good negotiation of obstacles in busy hallway. Pt comes to sitting on EOM.  Pt completes NMR including: - sit<>stands no UE support x10 (cues for controlling COG with head position) - squats x10 no UE support (min assist for postural stability) - step taps 4" step no UE support x10 BLE - alternating step taps 4" step no UE support x10  Pt returns to room ambulating with CGA and RW, pt remains seated in WC with all needs within reach, cal light in place at end of session.   SESSION 2: Pt presents in room in [facility] with pt wife present for family education, pt wife is completely visually impaired. Pt denies pain at this time but reporting feeling like both feet were "cold." Session focused on family training with pt wife for gait training and stair negotiation training.  Pt asking about placing AFOs in slip on shoe, therapist educations both pt and pt wife on recommended shoe "upper" with removable tongue to allow for ease of  donning/doffing with pt verbalizing understanding. Pt wife assist pt for donning bilateral AFOs, min cues from therapist on positioning of AFO. Pt completes sit to stand with RW and pt ambulates ~250' with RW and pt wife providing CGA for stability while ambulating with her probing cane. Pt and pt wife demonstrating good communication for ambulating. Pt comes to sitting on EOM.  Pt takes seated rest break then completes up/down 6" curb step with RW x2 with therapist providing CGA with pt demonstrating LOB posteriorly when descending on second trial requiring CGA to correct.  Pt then completes balance activity with wife providing CGA/min assist to learn how to assist with pt LOB with pt marching in place without UE support, demonstrating multidirectional sway in all directions and pt wife providing excellent guarding and corrective technique.  Therapist then assist pt wife with orientation of curb step for safety and pt completes up/down curb step with pt wife providing CGA, therapist providing close supervision for safety however pt and pt wife communicating well and pt wife demonstrating excellent guarding technique. Pt and pt wife asking great questions on how to manage doors for threshold step into home with therapist recommending having doors propped open prior to completing threshold step, both communicating this during trial.  Pt wife requesting what to do in event of larger LOB requiring lowering to floor with pt attempting LOB posteriorly to allow pt wife to utilize thigh to assist with lowering however pt unable to simulate due to fear of falling. Pt wife simulates  with therapist having LOB and pt wife demonstrates safely lowering therapist to floor.  Pt completes squat pivot transfer to [facility] and transported dependently to day room and pt wife shown nustep as pt reporting wanting to carryover exercise with nustep at DC. Pt then transported back to room with pt completing some WC mobility with  supervision with BLEs.  Pt remains seated in WC with all needs within reach, cal light in place and pt wife at bedside at end of session.   Therapy Documentation Precautions:  Precautions Precautions: Fall Restrictions Weight Bearing Restrictions Per Provider Order: No   Therapy/Group: Individual Therapy  Edwin Cap PT, DPT 12/06/2023, 10:22 AM

## 2023-12-06 NOTE — Progress Notes (Signed)
 PROGRESS NOTE   Subjective/Complaints:  No acute events noted overnight.  Continues to have cold feet  in legs below the knees.  We reviewed neurology plan.  He feels the prednisone is making him a little more irritable than he was before.  ROS:  Pt denies Fever, SOB, abd pain, CP, N/V/C/D, and vision changes  Cold feet -continued  Objective:   No results found.  No results for input(s): "WBC", "HGB", "HCT", "PLT" in the last 72 hours.     No results for input(s): "NA", "K", "CL", "CO2", "GLUCOSE", "BUN", "CREATININE", "CALCIUM" in the last 72 hours.     Intake/Output Summary (Last 24 hours) at 12/06/2023 1406 Last data filed at 12/06/2023 0300 Gross per 24 hour  Intake 118.5 ml  Output 1575 ml  Net -1456.5 ml        Physical Exam: Vital Signs Blood pressure (!) 139/96, pulse 74, temperature 98.8 F (37.1 C), resp. rate 16, height 5\' 7"  (1.702 m), weight 82.5 kg, SpO2 99%.        General: awake, alert, appropriate, sitting up at EOB; NAD HENT: conjugate gaze; oropharynx moist CV: regular rate and rhythm; no JVD Pulmonary: CTA B/L; no W/R/R- good air movement, non-labored GI: soft, NT, ND, (+)BS Psychiatric: appropriate-still anxious Neurological: Ox3 Extremities: No significant edema noted in his hands. Wearing wool socks on his feet, AFOs in place,  no LE swelling in knees or ankles B/L -lower legs do feel little cool to touch MSK  RUE- Biceps/triceps 4+/5; WE 1/5; grip 2/5 and FA 2-/5 LLE- Biceps/triceps 4/5; WE 1/5; grip 2+/5 and FA 1/5 Exam stable 2/23    Last done 11/23/23 Biceps, triceps, and shoulder abduction 4+/5; WE 0/5; Grip 3/5 and on R FA 1/5 and on L 1/5 RLE- HF 4 to 4+/5 KE 4/5; DF 0/5; PF 3+/5 LLE- HF 4 to 4+/5; KE 4/5; DF 0/5 and PF 3+/5 Extremities:- sensation:  Decreased to light touch from elbows to finger tips B/L. L>R    Assessment/Plan: 1. Functional deficits which  require 3+ hours per day of interdisciplinary therapy in a comprehensive inpatient rehab setting. Physiatrist is providing close team supervision and 24 hour management of active medical problems listed below. Physiatrist and rehab team continue to assess barriers to discharge/monitor patient progress toward functional and medical goals  Care Tool:  Bathing    Body parts bathed by patient: Right arm, Left lower leg, Face, Left arm, Chest, Abdomen, Front perineal area, Buttocks, Right upper leg, Left upper leg, Right lower leg         Bathing assist Assist Level: Supervision/Verbal cueing     Upper Body Dressing/Undressing Upper body dressing   What is the patient wearing?: Pull over shirt    Upper body assist Assist Level: Moderate Assistance - Patient 50 - 74%    Lower Body Dressing/Undressing Lower body dressing      What is the patient wearing?: Underwear/pull up, Pants     Lower body assist Assist for lower body dressing: Maximal Assistance - Patient 25 - 49%     Toileting Toileting    Toileting assist Assist for toileting: Supervision/Verbal cueing     Transfers Chair/bed  transfer  Transfers assist     Chair/bed transfer assist level: Supervision/Verbal cueing Chair/bed transfer assistive device: Walker, Archivist   Ambulation assist      Assist level: Supervision/Verbal cueing (close supervision) Assistive device: Walker-rolling Max distance: 360   Walk 10 feet activity   Assist     Assist level: Supervision/Verbal cueing Assistive device: Walker-rolling   Walk 50 feet activity   Assist    Assist level: Contact Guard/Touching assist Assistive device: Walker-rolling    Walk 150 feet activity   Assist Walk 150 feet activity did not occur:  (fatigue and imbalance)  Assist level: Supervision/Verbal cueing Assistive device: Walker-rolling    Walk 10 feet on uneven surface  activity   Assist Walk 10 feet  on uneven surfaces activity did not occur: Safety/medical concerns (did not attempt d/t pt fatigue)         Wheelchair     Assist Is the patient using a wheelchair?: Yes Type of Wheelchair: Manual Wheelchair activity did not occur: Safety/medical concerns  Wheelchair assist level: Supervision/Verbal cueing Max wheelchair distance: 150    Wheelchair 50 feet with 2 turns activity    Assist    Wheelchair 50 feet with 2 turns activity did not occur:  (fatigue and imbalance)   Assist Level: Supervision/Verbal cueing   Wheelchair 150 feet activity     Assist  Wheelchair 150 feet activity did not occur:  (fatigue and imbalance)   Assist Level: Supervision/Verbal cueing   Blood pressure (!) 139/96, pulse 74, temperature 98.8 F (37.1 C), resp. rate 16, height 5\' 7"  (1.702 m), weight 82.5 kg, SpO2 99%.  Medical Problem List and Plan: 1. 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             -will be on Prednisone 60 mg daily until f/u with Neurology outpatient             -patient may  shower             -ELOS/Goals: 2-3 weeks- supervision to mod i  -d/c 2/25  D/w Dr Terrace Arabia about making sure gets IVIG after d/c  Con't CIR PT and OT  Rituximab on hold right now- Quantiferon was equivocal- might place TB test  To do IVIG qweek  maintenance- for 2 days- will do outpt as well - will do Friday?Saturday again  Will schedule IVIG for Thursday and Friday evening at 6pm- d/w pharmacy  2/21- got a dose last night- no side effects except maybe feeling swollen  2/22 continues to feel like his hands are swollen.  Per neurology, patient to defer Rituxan discussion to outpatient.  Weekly IVIG.  Bactrim started for PCP prophylaxis  2/23 reviewed neurology follow-up plan, IVIG treatments.  He is still thinking about Rituxan and plans to discuss with Dr. Terrace Arabia outpatient.   2.  Antithrombotics: -DVT/anticoagulation:  Pharmaceutical: Lovenox             -antiplatelet therapy:  aspirin 81 mg daily   3. Pain Management: Tylenol as needed             -continue gabapentin 900 mg TID and 900 mg daily PRN             -continue Percocet 5/325 q 4 hours PRN  -Continue Cymbalta 60 mg daily   -Consider trial of Lyrica to replace gabapentin   2/6- will increase Duloxetine to 90 mg daily and stop Zoloft so can do so.   2/9 pain improving. Continue  plan above  2/12- pt reports still a major issue- but will increase Duloxetine to 120 mg daily and monitor  2/14- no improvement so far, but just started yesterday with increased dose- 2-15: Ongoing severe back pain overnight.  Is using Percocet consistently every 4 hours, has not used Tylenol since 2-1.  Did use as needed gabapentin overnight, with benefit. 2/17- taking pain meds regularly.  2/19- asking ~ q4 hours for pain meds 2/23 patient still using as needed Percocet.  Discussed that he can use his as needed gabapentin-has not received this in the last few nights 4. Mood/Behavior/Sleep: LCSW to evaluate and provide emotional support             -continue Cymbalta 60 mg daily             -continue Zoloft 25 mg daily             -continue clonazepam 0.5 mg BID prn             -antipsychotic agents: n/a  -Patient requested increase of clonazepam due to anxiety.  He takes this medication chronically PDMP reviewed, will increase temporarily to 1 mg twice daily as needed  -1/31 pt reports anxiety is improved   2/4- pt reports anxiety is better due to Prednisone same with irritability.   2/10- appears more anxious since recovery not going as expected  2/13 -2/17- Anxiety getting worse as day of d/c gets closer  2/18- "terrified" about not getting IVIG after d/c and having to "start back at beginning"- reached out to Dr Terrace Arabia to try and assist. 2/21- pt asking for Rituximab once gets out of rehab  5. Neuropsych/cognition: This patient is capable of making decisions on his own behalf.   6. Skin/Wound Care: Routine skin care checks    7. Fluids/Electrolytes/Nutrition: Routine Is and Os and follow-up chemistries   8: Hypertension: monitor TID and prn             -continue Avapro 300 mg daily             -continue Toprol-XL 25 mg daily  -1/31 controlled, continue current      12/06/2023    5:41 AM 12/05/2023    8:23 PM 12/05/2023    3:21 PM  Vitals with BMI  Systolic 139 120 956  Diastolic 96 75 83  Pulse 74 82 82  2/1 Will reduce losartan to 75 mg, start amlodipine 5 mg and monitor- see no. 14 BP controlled will try on amlodipine alone 2/3 BP controlled continue current regimen  2/13- slightly elevated this AM, however was soft last evening- won't make any changes yet  2/14- BP controlled- but can increase Norvasc to 5 mg over weekend if need be for foot coldness  2/17- BP controlled on Norvasc-   2/18- BP soft today- but will monitor for [facility]- otherwise, con't regimen  2/19- Explained ot pt cannot increase Norvasc because BP on soft side- and would drop his BP- so on max dose he can tolerate  2/20-2/21 BP very slightly elevated- but is rare, will con't regimen  2/23 BP controlled overall, continue current regimen and monitor, could consider going up on amlodipine as BP has been less soft 9: Hypothyroidism: continue Synthroid 125 mcg daily   10: Tobacco use: cessation counseling -Nicorette gum as needed (not using)             -? cough>> on scheduled Mucinex-DM BID   11: CIDP -continue prednisone 40 mg daily -daily  VC and NIF (? end date?) -follow-up Dr. Terrace Arabia -1/30 respiratory function- NIF >-40 and IS 2500, stable. Continue to monitor -2/3 NIF/IS stable   2/4- NIF's VC OK- on ICS actually- spoke with Dr Terrace Arabia- she wants [name] to do another dosing of IVIG over a total of 4 days- 2g/kg total dose over 4 days- will arrange with Pharmacy- spoke with them as well- will also increase Prednisone to 60 mg daily- since I think it was decreased- supposed to be on until f/u with Dr Terrace Arabia- needs BMP daily per pharmacy while on  IVIG.   2/11- will reach out to to Dr Terrace Arabia since strength not too much better after IVIG-    2/12- haven't heard back from Dr Terrace Arabia- will wait to hear something- explained to pt that I don't know if there's another tx- at least form my experience other than what's been tried.   2/13- waiting to have pt seen by Neuro- d/w them this AM- they will help narrow down plan.   2/14- started IVIG again x 2 days- waiting for the moment on Rituximab - 2-15: CT with resolving right upper lobe scarring, no other concerning masses for neoplastic process.  Neurology holding rituximab until QuantiFERON gold results. Unable to do Paraneoplastic panel as unreliable after IVIG.  --2/16 QuantiFERON still pending, per neurology patient is feeling 50% better after IVIG; holding off on paraneoplastic panel and rituximab  2/17- Going to do IVIG qweek for 2 days/week- will d/w Neuro when next to do IVIG- Friday?  2/18- will do IVIG in evening so doesn't interfere with therapy- on Friday/Saturday it appears unless Neuro has other recs.   2/19- changed IVIG to TH/Friday at 6pm- d/w pharmacy- also they are waiting on QuantiFERON test that should come back 2/20- to see if can do Rituximab  2/20- test came back equivocal- per Neuro- will get regular skin TB test after d/w ID- and see what that shows and go from there  2/1- reordered Quantiferon- and pt interested in Rituximab as well  2/23 quantiferon pending 12: Leukocytosis: likely steroid induced, improving; no fever              -follow-up CBC with differential Thursday 1/30 and q Monday  1/3 WBC elevated but stable at 15,  likely steroid related  2/10- WBC 11.9- doing better on steroids  Recheck tomorrow    Latest Ref Rng & Units 11/30/2023    5:23 AM 11/23/2023    5:40 AM 11/16/2023    5:42 AM  CBC  WBC 4.0 - 10.5 K/uL 11.2  11.9  15.0   Hemoglobin 13.0 - 17.0 g/dL 40.9  81.1  91.4   Hematocrit 39.0 - 52.0 % 41.8  40.8  40.9   Platelets 150 - 400 K/uL 310  347  397      13.  Elevated ALT  -2/3 improved to 48 today  2/10- will recheck next week   2/18- ALT down to 41- is normal range  14. Cold feeling in b/l legs.  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  EXTR warm, pulses are normal, do not think he needs arterial Dopplers.  Likely autonomic, trial calcium channel blocker  2/5- so far, no improvement with Norvasc 5 mg daily-   2/6- pt emphatic that it's the worst part of his pain- he also describes feet feeling wet - but are dry and warmish to touch- will increase Duloxetine to 90 mg daily and sotp Zoloft.   2/7- pt agreed to change- said nerve pain/cold  in feet slightly better already- thinks Norvasc made him feel fatigued, so wants to stop   2/9 pt willing to try norvasc again for sx. Feels that it helped   -will start low dose 2.5mg  daily   2/10- will con't for now- pt told me didn't help initially, but we will see  2/11- pt says yesterday cold was SO bad, it was really painful- will con't Amlodipine. 2/12- Will increase Duloxetine to 120 mg daily- is really bothering him. Wait to increase Norvasc today  2/13- no change, but has noticed when  leaves feet down at least 15 minutes, feet get colder 2/14- can increase Norvasc to 5 mg over the weekend 2/15: Cold sensitivity seems dependent on positioning, patient in bed today and not bothered, will hold further medication for now 2/16: Increase Norvasc for sensitivity to 5 mg daily 2/17- no change in cold feelings of fet- wife bringing in wool socks for him.  2/18- pt got him wool socks- pt perseverates on topic- no change so far 2/22 consider increase Norvasc dose if BP remains stable 15. Constipation  2/20- LBM 2 days ago  2/21- LBM yesterday  2/23 LBM yesterday- improved      LOS: 26 days A FACE TO FACE EVALUATION WAS PERFORMED  Fanny Dance 12/06/2023, 2:06 PM

## 2023-12-07 DIAGNOSIS — F411 Generalized anxiety disorder: Secondary | ICD-10-CM

## 2023-12-07 LAB — COMPREHENSIVE METABOLIC PANEL
ALT: 37 U/L (ref 0–44)
AST: 25 U/L (ref 15–41)
Albumin: 3.1 g/dL — ABNORMAL LOW (ref 3.5–5.0)
Alkaline Phosphatase: 31 U/L — ABNORMAL LOW (ref 38–126)
Anion gap: 8 (ref 5–15)
BUN: 12 mg/dL (ref 6–20)
CO2: 26 mmol/L (ref 22–32)
Calcium: 8.9 mg/dL (ref 8.9–10.3)
Chloride: 102 mmol/L (ref 98–111)
Creatinine, Ser: 0.79 mg/dL (ref 0.61–1.24)
GFR, Estimated: >60 mL/min (ref >60)
Glucose, Bld: 81 mg/dL (ref 70–99)
Potassium: 4 mmol/L (ref 3.5–5.1)
Sodium: 136 mmol/L (ref 135–145)
Total Bilirubin: 0.7 mg/dL (ref 0.0–1.2)
Total Protein: 7.7 g/dL (ref 6.5–8.1)

## 2023-12-07 LAB — CBC
HCT: 43.1 % (ref 39.0–52.0)
Hemoglobin: 14.4 g/dL (ref 13.0–17.0)
MCH: 30.9 pg (ref 26.0–34.0)
MCHC: 33.4 g/dL (ref 30.0–36.0)
MCV: 92.5 fL (ref 80.0–100.0)
Platelets: 345 10*3/uL (ref 150–400)
RBC: 4.66 MIL/uL (ref 4.22–5.81)
RDW: 13.3 % (ref 11.5–15.5)
WBC: 12.2 10*3/uL — ABNORMAL HIGH (ref 4.0–10.5)
nRBC: 0 % (ref 0.0–0.2)

## 2023-12-07 MED FILL — Immune Globulin (Human) IV Soln 10 GM/100ML: INTRAVENOUS | Qty: 100 | Status: AC

## 2023-12-07 MED FILL — Immune Globulin (Human) IV Soln 20 GM/200ML: INTRAVENOUS | Qty: 200 | Status: AC

## 2023-12-07 MED FILL — Immune Globulin (Human) IV Soln 5 GM/50ML: INTRAVENOUS | Qty: 50 | Status: AC

## 2023-12-07 NOTE — Progress Notes (Signed)
 PROGRESS NOTE   Subjective/Complaints:  NO acute events- is c/o coldness of feet and ankles and unhappy there's additional treatment that's available.    Very scared of recurrence- and scared of not getting IVIG- I explained we've talked to Dr Terrace Arabia and she's working on this.   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 Insurance issues getting it frequently.   Was started on bactrim for PCP prophylaxis due to reduced immunity with 60 mg prednisone- pt upset he wasn't told this, even though is in chart.   Having regular Bms per pt ROS:   Pt denies SOB, abd pain, CP, N/V/C/D, and vision changes  Cold feet -continued  Objective:   No results found.  Recent Labs    12/07/23 0616  WBC 12.2*  HGB 14.4  HCT 43.1  PLT 345       Recent Labs    12/07/23 0616  NA 136  K 4.0  CL 102  CO2 26  GLUCOSE 81  BUN 12  CREATININE 0.79  CALCIUM 8.9       Intake/Output Summary (Last 24 hours) at 12/07/2023 0932 Last data filed at 12/07/2023 0800 Gross per 24 hour  Intake 1180 ml  Output 3200 ml  Net -2020 ml        Physical Exam: Vital Signs Blood pressure (!) 133/93, pulse 62, temperature 97.7 F (36.5 C), temperature source Oral, resp. rate 18, height 5\' 7"  (1.702 m), weight 82.5 kg, SpO2 99%.    General: awake, alert, appropriate, NAD HENT: conjugate gaze; oropharynx moist CV: regular rate and rhythm; no JVD Pulmonary: CTA B/L; no W/R/R- good air movement GI: soft, NT, ND, (+)BS Psychiatric: slightly irritable Neurological: Ox3  Extremities: wearing wool socks- no LE edema- cool to touch- MSK  RUE- Biceps/triceps 4+/5; WE 1/5; grip 2/5 and FA 2-/5 LLE- Biceps/triceps 4/5; WE 1/5; grip 2+/5 and FA 1/5 Exam stable 2/23    Last done 11/23/23 Biceps, triceps, and shoulder abduction 4+/5; WE  0/5; Grip 3/5 and on R FA 1/5 and on L 1/5 RLE- HF 4 to 4+/5 KE 4/5; DF 0/5; PF 3+/5 LLE- HF 4 to 4+/5; KE 4/5; DF 0/5 and PF 3+/5 Extremities:- sensation:  Decreased to light touch from elbows to finger tips B/L. L>R    Assessment/Plan: 1. Functional deficits which require 3+ hours per day of interdisciplinary therapy in a comprehensive inpatient rehab setting. Physiatrist is providing close team supervision and 24 hour management of active medical problems listed below. Physiatrist and rehab team continue to assess barriers to discharge/monitor patient progress toward functional and medical goals  Care Tool:  Bathing    Body parts bathed by patient: Right arm, Left lower leg, Face, Left arm, Chest, Abdomen, Front perineal area, Buttocks, Right upper leg, Left upper leg, Right lower leg         Bathing assist Assist Level: Supervision/Verbal cueing     Upper Body Dressing/Undressing Upper body dressing   What is the patient wearing?: Pull over shirt    Upper body assist Assist Level: Moderate  Assistance - Patient 50 - 74%    Lower Body Dressing/Undressing Lower body dressing      What is the patient wearing?: Underwear/pull up, Pants     Lower body assist Assist for lower body dressing: Maximal Assistance - Patient 25 - 49%     Toileting Toileting    Toileting assist Assist for toileting: Supervision/Verbal cueing     Transfers Chair/bed transfer  Transfers assist     Chair/bed transfer assist level: Supervision/Verbal cueing Chair/bed transfer assistive device: Walker, Armrests   Locomotion Ambulation   Ambulation assist      Assist level: Supervision/Verbal cueing (close supervision) Assistive device: Walker-rolling Max distance: 360   Walk 10 feet activity   Assist     Assist level: Supervision/Verbal cueing Assistive device: Walker-rolling   Walk 50 feet activity   Assist    Assist level: Contact Guard/Touching assist Assistive  device: Walker-rolling    Walk 150 feet activity   Assist Walk 150 feet activity did not occur:  (fatigue and imbalance)  Assist level: Supervision/Verbal cueing Assistive device: Walker-rolling    Walk 10 feet on uneven surface  activity   Assist Walk 10 feet on uneven surfaces activity did not occur: Safety/medical concerns (did not attempt d/t pt fatigue)         Wheelchair     Assist Is the patient using a wheelchair?: Yes Type of Wheelchair: Manual Wheelchair activity did not occur: Safety/medical concerns  Wheelchair assist level: Supervision/Verbal cueing Max wheelchair distance: 150    Wheelchair 50 feet with 2 turns activity    Assist    Wheelchair 50 feet with 2 turns activity did not occur:  (fatigue and imbalance)   Assist Level: Supervision/Verbal cueing   Wheelchair 150 feet activity     Assist  Wheelchair 150 feet activity did not occur:  (fatigue and imbalance)   Assist Level: Supervision/Verbal cueing   Blood pressure (!) 133/93, pulse 62, temperature 97.7 F (36.5 C), temperature source Oral, resp. rate 18, height 5\' 7"  (1.702 m), weight 82.5 kg, SpO2 99%.  Medical Problem List and Plan: 1. 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   Rituximab on hold right now- Quantiferon was equivocal- might place TB test  To do IVIG qweek  maintenance- for 2 days- will do outpt as well - will do Friday?Saturday again  Will schedule IVIG for Thursday and Friday evening at 6pm- d/w pharmacy  2/21- got a dose last night- no side effects except maybe feeling swollen  2/22  continues to feel like his hands are swollen.  Per neurology, patient to defer Rituxan discussion to outpatient.  Weekly IVIG.  Bactrim started for PCP prophylaxis  2/23 reviewed neurology follow-up plan, IVIG treatments.  He is still thinking about Rituxan and plans to discuss with Dr. Terrace Arabia outpatient.   2/24- pt asking about Vygart- will need to look at in outpt- it's not available in  the hospital.  2.  Antithrombotics: -DVT/anticoagulation:  Pharmaceutical: Lovenox             -antiplatelet therapy: aspirin 81 mg daily   3. Pain Management: Tylenol as needed             -continue gabapentin 900 mg TID and 900 mg daily PRN             -continue Percocet 5/325 q 4 hours PRN  -Continue Cymbalta 60 mg daily   -Consider trial of Lyrica to replace gabapentin   2/6- will increase Duloxetine to 90 mg daily and stop Zoloft so can do so.   2/9 pain improving. Continue plan above  2/12- pt reports still a major issue- but will increase Duloxetine to 120 mg daily and monitor  2/14- no improvement so far, but just started yesterday with increased dose- 2-15: Ongoing severe back pain overnight.  Is using Percocet consistently every 4 hours, has not used Tylenol since 2-1.  Did use as needed gabapentin overnight, with benefit. 2/17- taking pain meds regularly.  2/19- asking ~ q4 hours for pain meds 2/23 patient still using as needed Percocet.  Discussed that he can use his as needed gabapentin-has not received this in the last few nights 2/24- still on Percocet and taking frequently- will see if he can reduce to q6 hours 4. Mood/Behavior/Sleep: LCSW to evaluate and provide emotional support             -continue Cymbalta 60 mg daily             -continue Zoloft 25 mg daily             -continue clonazepam 0.5 mg BID prn             -antipsychotic agents: n/a  -Patient requested increase of clonazepam due to anxiety.  He takes this medication chronically PDMP reviewed, will increase temporarily to 1 mg  twice daily as needed  -1/31 pt reports anxiety is improved   2/4- pt reports anxiety is better due to Prednisone same with irritability.   2/10- appears more anxious since recovery not going as expected  2/13 -2/17- Anxiety getting worse as day of d/c gets closer  2/18- "terrified" about not getting IVIG after d/c and having to "start back at beginning"- reached out to Dr Terrace Arabia to try and assist. 2/21- pt asking for Rituximab once gets out of rehab  5. Neuropsych/cognition: This patient is capable of making decisions on his own behalf.   6. Skin/Wound Care: Routine skin care checks   7. Fluids/Electrolytes/Nutrition: Routine Is and Os and follow-up chemistries   8: Hypertension: monitor TID and prn             -continue Avapro 300 mg daily             -continue Toprol-XL 25 mg daily  -1/31 controlled, continue current      12/07/2023    3:19 AM 12/06/2023    9:40 PM 12/06/2023    4:26 PM  Vitals with BMI  Systolic 133 114 981  Diastolic 93 78 85  Pulse 62 72 77  2/1 Will reduce losartan to 75 mg, start amlodipine 5 mg and monitor- see no. 14 BP controlled will try on amlodipine alone 2/3 BP controlled continue current regimen  2/13- slightly elevated this AM, however was soft last evening- won't make any changes yet  2/14- BP controlled- but can increase Norvasc to 5 mg over weekend if need  be for foot coldness  2/17- BP controlled on Norvasc-   2/18- BP soft today- but will monitor for [facility]- otherwise, con't regimen  2/19- Explained ot pt cannot increase Norvasc because BP on soft side- and would drop his BP- so on max dose he can tolerate  2/20-2/21 BP very slightly elevated- but is rare, will con't regimen  2/23 BP controlled overall, continue current regimen and monitor, could consider going up on amlodipine as BP has been less soft  2/24- BP running 110-117 except in AM-  9: Hypothyroidism: continue Synthroid 125 mcg daily   10: Tobacco use: cessation counseling -Nicorette gum  as needed (not using)             -? 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 end date?) -follow-up Dr. Terrace Arabia -1/30 respiratory function- NIF >-40 and IS 2500, stable. Continue to monitor -2/3 NIF/IS stable   2/4- NIF's VC OK- on ICS actually- spoke with Dr Terrace Arabia- she wants [name] to do another dosing of IVIG over a total of 4 days- 2g/kg total dose over 4 days- will arrange with Pharmacy- spoke with them as well- will also increase Prednisone to 60 mg daily- since I think it was decreased- supposed to be on until f/u with Dr Terrace Arabia- needs BMP daily per pharmacy while on IVIG.   2/11- will reach out to to Dr Terrace Arabia since strength not too much better after IVIG-    2/12- haven't heard back from Dr Terrace Arabia- will wait to hear something- explained to pt that I don't know if there's another tx- at least form my experience other than what's been tried.   2/13- waiting to have pt seen by Neuro- d/w them this AM- they will help narrow down plan.   2/14- started IVIG again x 2 days- waiting for the moment on Rituximab - 2-15: CT with resolving right upper lobe scarring, no other concerning masses for neoplastic process.  Neurology holding rituximab until QuantiFERON gold results. Unable to do Paraneoplastic panel as unreliable after IVIG.  --2/16 QuantiFERON still pending, per neurology patient is feeling 50% better after IVIG; holding off on paraneoplastic panel and rituximab  2/17- Going to do IVIG qweek for 2 days/week- will d/w Neuro when next to do IVIG- Friday?  2/18- will do IVIG in evening so doesn't interfere with therapy- on Friday/Saturday it appears unless Neuro has other recs.   2/19- changed IVIG to TH/Friday at 6pm- d/w pharmacy- also they are waiting on QuantiFERON test that should come back 2/20- to see if can do Rituximab  2/20- test came back equivocal- per Neuro- will get regular skin TB test after d/w ID- and see what that shows and go from  there  2/1- reordered Quantiferon- and pt interested in Rituximab as well  2/23 quantiferon pending  2/24- Pt started on Bactrim 1 tab daily to prevent PCP pneumonia- and pt asking about Vygart- d/w neuro- will get back to pt about it.  12: Leukocytosis: likely steroid induced, improving; no fever              -follow-up CBC with differential Thursday 1/30 and q Monday  1/3 WBC elevated but stable at 15,  likely steroid related  2/10- WBC 11.9- doing better on steroids  Recheck tomorrow    Latest Ref Rng & Units 12/07/2023    6:16 AM 11/30/2023    5:23 AM 11/23/2023    5:40 AM  CBC  WBC 4.0 - 10.5 K/uL 12.2  11.2  11.9   Hemoglobin 13.0 - 17.0 g/dL 40.9  81.1  91.4   Hematocrit 39.0 - 52.0 % 43.1  41.8  40.8   Platelets 150 - 400 K/uL 345  310  347     13.  Elevated ALT  -2/3 improved to 48 today  2/10- will recheck next week   2/18- ALT down to 41- is normal range  14. Cold feeling in b/l legs.  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  EXTR warm, pulses are normal, do not think he needs arterial Dopplers.  Likely autonomic, trial calcium channel blocker  2/5- so far, no improvement with Norvasc 5 mg daily-   2/6- pt emphatic that it's the worst part of his pain- he also describes feet feeling wet - but are dry and warmish to touch- will increase Duloxetine to 90 mg daily and sotp Zoloft.   2/7- pt agreed to change- said nerve pain/cold in feet slightly better already- thinks Norvasc made him feel fatigued, so wants to stop   2/9 pt willing to try norvasc again for sx. Feels that it helped   -will start low dose 2.5mg  daily   2/10- will con't for now- pt told me didn't help initially, but we will see  2/11- pt says yesterday cold was SO bad, it was really painful- will con't Amlodipine. 2/12- Will increase Duloxetine to 120 mg daily- is really bothering him. Wait to increase Norvasc today  2/13- no change, but has noticed when  leaves feet down at least 15 minutes, feet get colder 2/14- can  increase Norvasc to 5 mg over the weekend 2/15: Cold sensitivity seems dependent on positioning, patient in bed today and not bothered, will hold further medication for now 2/16: Increase Norvasc for sensitivity to 5 mg daily 2/17- no change in cold feelings of fet- wife bringing in wool socks for him.  2/18- pt got him wool socks- pt perseverates on topic- no change so far 2/22 consider increase Norvasc dose if BP remains stable 2/24- BP lower again- I'm concerned he will drop too much when at 110s systolic during day already.  15. Constipation  2/20- LBM 2 days ago  2/21- LBM yesterday  2/23 LBM yesterday- improved  I spent a total of 39   minutes on total care today- >50% coordination of care- due to d/w neuro and prolonged time in pt reassuring and reviewing reasoning for Bactrim.      LOS: 27 days A FACE TO FACE EVALUATION WAS PERFORMED  Luci Bellucci 12/07/2023, 9:32 AM

## 2023-12-07 NOTE — Consult Note (Signed)
 Neuropsychological Consultation [facility]   Patient:   Jonathan Luna   DOB:   09/29/1966  MR Number:  [id number]  Location:  [facility] [address] CENTER A [address] [place] [address] Dept: [phone] Loc: [phone]           Date of Service:   12/03/2023  Start Time:   10 PM End Time:   11 PM  Provider/Observer:  Arley Phenix, Psy.D.       Clinical Neuropsychologist       Billing Code/Service: [phone]  Reason for Service:    Jonathan Luna is a 58 year old male referred for neuropsychological consultation during his current admission onto CIR.  Patient had recently been seeing at [facility] where I also saw him individually having been discharged from the unit on 10/20/2023.  At that point he had been diagnosed with acute inflammatory demyelinating polyneuropathy and was treated with a 5-day course of IVIG patient had improved prior to discharge but did continue with some issues related to numbness.  Patient presented again to the emergency department on 10/25/2023 with exam noting decreased sensation to bilateral side of face and cheek as well as decreased sensation to the bilateral forearms and hands.  Patient responded well to intervention and was discharged with neurology follow-up.  On 1/16 patient saw his neurologist Dr. Terrace Arabia who noted worsening symptoms the patient agreed to admission for Plex and was ordered for 5 treatments as well as CBC and NIF every 12 hours.  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  Diagnostic considerations including changing diagnosis to chronic status.  Patient does have a past history that includes chronic pain, panic attacks previously.  Patient continues with his home medications including clonazepam and duloxetine.  Patient is compliant with medications.  Today's clinical visit  was my second visit but first time I have seen him in roughly 20 days.  The patient is getting ready to be discharged tomorrow.  The patient has considerable worry and anxiety about what to expect post discharge although he has been encouraged by what his wife's been able to do during the family sessions.  The patient is wife is legally blind due to retinitis pigmentosa.  Patient continues with significant sensorimotor deficits and has continued with IVIG treatments with little benefit from plasma infusions.  The patient reports that he has developed skills needed to manage his day-to-day life for the most part but continues with significant deficits and has not returned to baseline.  Patient is quite concerned about the possibility of not having return to functionality.  Patient has had no impact on cognitive functioning.  HPI for the current admission:    HPI: Jonathan Luna is a 58 year old R handed male who was discharged from CIR on 10/20/2023 due to acute inflammatory demyelinating polyneuropathy treated with 5 day course of IVIG. He has had issues with ongoing numbness. He presented to the ED on 10/25/2023 where exam was notable for decreased sensation to bilateral side of face and cheek as well as decrease sensation to bilateral forearms and hands. He performed well on NIF  and was able to ambulate with RW. Discharged with neurology follow-up as outpatient on 1/16. At that visit with Dr. Terrace Arabia, he agreed to admission to the hospital for PLEX and admitted by [facility]. Dr. Amada Jupiter consulted. PLEX ordered for 5 treatment as well as  VC and NIF every 12 hours.  Medically stabilized.  Labs stable.  Tolerating diet.  NIF greater than -40 cm of water and incentive spirometry 2500 mL.  Requiring min assist for basic self-care skills and contact-guard assist for mobility.The patient requires inpatient medicine and rehabilitation evaluations and services for ongoing dysfunction secondary to acute exacerbation of inflammatory  demyelinating polyneuropathy.   Past medical history difficult for hypertension, hyperlipidemia, hypothyroidism chronic pain, depression, panic attacks, juvenile cataracts, CAP on 08/28/2023.   Medical History:   Past Medical History:  Diagnosis Date   Allergy    Anxiety    Arthritis    Cataract    Community acquired pneumonia 08/28/2023   Depression    Detached retina    Heart murmur    History of bilateral cataract extraction    Hyperlipidemia    Hypothyroidism    Low back pain    Panic attack    Panic attacks    Pneumonia of right upper lobe due to infectious organism 08/18/2023         Patient Active Problem List   Diagnosis Date Noted   CIDP (chronic inflammatory demyelinating polyneuropathy) (HCC) 10/29/2023   Coping style affecting medical condition 10/18/2023   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/05/2023   AIDP (acute inflammatory demyelinating polyneuropathy) (HCC) 09/30/2023   Paresthesia 09/22/2023   Gait abnormality 09/22/2023   Tremor 09/04/2023   DOE (dyspnea on exertion) 09/04/2023   Peripheral neuropathy 08/28/2023   Tachycardia 08/18/2023   Hypogonadism male 10/24/2021   Vitamin D deficiency disease 09/08/2018   Essential hypertension 09/07/2018   Long-term current use of opiate analgesic 04/07/2018   Low back pain 05/08/2016   Allergic rhinitis 12/13/2012   Chronic pain disorder 09/29/2012   Routine general medical examination at a health care facility 10/08/2011   Irritable bowel syndrome 10/16/2010   DDD (degenerative disc disease), lumbar 04/09/2010   Hypothyroidism 05/03/2009   Hyperlipidemia with target LDL less than 130 05/03/2009   Panic anxiety syndrome 05/03/2009    Behavioral Observation/Mental Status:   Jonathan Luna  presents as a 58 y.o.-year-old Right handed Caucasian Male who appeared his stated age. his dress was Appropriate and he was Well Groomed and his manners were Appropriate to the situation.  his  participation was indicative of Appropriate behaviors.  There were physical disabilities noted.  he displayed an appropriate level of cooperation and motivation.    Interactions:    Active Appropriate  Attention:   within normal limits and attention span and concentration were age appropriate  Memory:   within normal limits; recent and remote memory intact  Visuo-spatial:   not examined  Speech (Volume):  normal  Speech:   normal; normal  Thought Process:  Coherent and Relevant  Coherent, Logical, and Organized  Though Content:  WNL; not suicidal and not homicidal  Orientation:   person, place, time/date, and situation  Judgment:   Good  Planning:   Good  Affect:    Anxious  Mood:    Anxious and Dysphoric  Insight:   Good  Intelligence:   high  Psychiatric History:  Patient with prior history related to anxiety symptoms including history of panic and anxiety symptoms.  Family Med/Psych History:  Family History  Problem Relation Age of Onset   Breast cancer Mother    Hypertension Father    Heart disease Father    Asthma Father    Emphysema Father        smoker for 45 years  Diabetes Brother    Hyperlipidemia Brother     Impression/DX:   Jonathan Luna is a 58 year old male referred for neuropsychological consultation during his current admission onto CIR.  Patient had recently been seeing at [facility] where I also saw him individually having been discharged from the unit on 10/20/2023.  At that point he had been diagnosed with acute inflammatory demyelinating polyneuropathy and was treated with a 5-day course of IVIG patient had improved prior to discharge but did continue with some issues related to numbness.  Patient presented again to the emergency department on 10/25/2023 with exam noting decreased sensation to bilateral side of face and cheek as well as decreased sensation to the bilateral forearms and hands.  Patient responded well to intervention and was discharged with  neurology follow-up.  On 1/16 patient saw his neurologist Dr. Terrace Arabia who noted worsening symptoms the patient agreed to admission for Plex and was ordered for 5 treatments as well as CBC and NIF every 12 hours.  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  Diagnostic considerations including changing diagnosis to chronic status.  Patient does have a past history that includes chronic pain, panic attacks previously.  Patient continues with his home medications including clonazepam and duloxetine.  Patient is compliant with medications.  Today's clinical visit was my second visit but first time I have seen him in roughly 20 days.  The patient is getting ready to be discharged tomorrow.  The patient has considerable worry and anxiety about what to expect post discharge although he has been encouraged by what his wife's been able to do during the family sessions.  The patient is wife is legally blind due to retinitis pigmentosa.  Patient continues with significant sensorimotor deficits and has continued with IVIG treatments with little benefit from plasma infusions.  The patient reports that he has developed skills needed to manage his day-to-day life for the most part but continues with significant deficits and has not returned to baseline.  Patient is quite concerned about the possibility of not having return to functionality.  Patient has had no impact on cognitive functioning.  Disposition/Plan:  Primary coping and adjustment issues with impending discharge from CIR.  Patient had multiple questions about his status and what to expect and how to cope with loss of function and deal with issues that are of concern regarding how his wife will manage with his disabilities.          Electronically Signed   _______________________ Arley Phenix, Psy.D. Clinical Neuropsychologist

## 2023-12-07 NOTE — Plan of Care (Signed)
  Problem: RH Eating Goal: LTG Patient will perform eating w/assist, cues/equip (OT) Description: LTG: Patient will perform eating with assist, with/without cues using equipment (OT) Outcome: Completed/Met Flowsheets (Taken 11/11/2023 1554) LTG: Pt will perform eating with assistance level of: Independent with assistive device    Problem: RH Grooming Goal: LTG Patient will perform grooming w/assist,cues/equip (OT) Description: LTG: Patient will perform grooming with assist, with/without cues using equipment (OT) Outcome: Completed/Met Flowsheets (Taken 11/11/2023 1554) LTG: Pt will perform grooming with assistance level of: Independent with assistive device    Problem: RH Bathing Goal: LTG Patient will bathe all body parts with assist levels (OT) Description: LTG: Patient will bathe all body parts with assist levels (OT) Outcome: Completed/Met Flowsheets (Taken 11/11/2023 1554) LTG: Pt will perform bathing with assistance level/cueing: Independent with assistive device    Problem: RH Dressing Goal: LTG Patient will perform upper body dressing (OT) Description: LTG Patient will perform upper body dressing with assist, with/without cues (OT). Outcome: Completed/Met Flowsheets (Taken 11/11/2023 1554) LTG: Pt will perform upper body dressing with assistance level of: Independent with assistive device Goal: LTG Patient will perform lower body dressing w/assist (OT) Description: LTG: Patient will perform lower body dressing with assist, with/without cues in positioning using equipment (OT) Outcome: Completed/Met Flowsheets (Taken 11/11/2023 1554) LTG: Pt will perform lower body dressing with assistance level of: Independent with assistive device   Problem: RH Toileting Goal: LTG Patient will perform toileting task (3/3 steps) with assistance level (OT) Description: LTG: Patient will perform toileting task (3/3 steps) with assistance level (OT)  Outcome: Completed/Met Flowsheets (Taken  11/11/2023 1554) LTG: Pt will perform toileting task (3/3 steps) with assistance level: Independent with assistive device   Problem: RH Toilet Transfers Goal: LTG Patient will perform toilet transfers w/assist (OT) Description: LTG: Patient will perform toilet transfers with assist, with/without cues using equipment (OT) Outcome: Completed/Met Flowsheets (Taken 11/11/2023 1554) LTG: Pt will perform toilet transfers with assistance level of: Independent with assistive device   Problem: RH Tub/Shower Transfers Goal: LTG Patient will perform tub/shower transfers w/assist (OT) Description: LTG: Patient will perform tub/shower transfers with assist, with/without cues using equipment (OT) Outcome: Completed/Met Flowsheets (Taken 11/11/2023 1554) LTG: Pt will perform tub/shower stall transfers with assistance level of: Independent with assistive device

## 2023-12-07 NOTE — Progress Notes (Signed)
 Physical Therapy Discharge Summary  Patient Details  Name: Jonathan Luna MRN: [id number] Date of Birth: 1966/10/13  Date of Discharge from PT service:December 07, 2023  Today's Date: 12/07/2023 PT Individual Time: 1505-1530, 1505- 1530 PT Individual Time Calculation (min): 25 min, 25 min   Patient has met 5 of 6 long term goals due to improved activity tolerance, improved balance, improved postural control, increased strength, ability to compensate for deficits, improved awareness, and improved coordination.  Patient to discharge at an ambulatory level Modified Independent.   Patient's care partner is independent to provide the necessary physical assistance at discharge.  Reasons goals not met: Pt did not meet curb step goal because pt is still limited by LE weakness and pt's wife cannot give physical assist necessary for curb step at threshold of pt's home. Recommended ramp for safety.   Recommendation:  Patient will benefit from ongoing skilled PT services in home health setting to continue to advance safe functional mobility, address ongoing impairments in BLE strength and endurance, global ataxia, and minimize fall risk.  Equipment: RW  Reasons for discharge: treatment goals met  Patient/family agrees with progress made and goals achieved: Yes  PT Discharge Precautions/Restrictions Precautions Precautions: Fall Restrictions Weight Bearing Restrictions Per Provider Order: No Vital Signs Therapy Vitals Temp: 98.2 F (36.8 C) Temp Source: Oral Pulse Rate: 84 Resp: 17 BP: 117/83 Patient Position (if appropriate): Lying Oxygen Therapy SpO2: 92 % O2 Device: Room Air Pain Pain Assessment Pain Scale: 0-10 Pain Score: 5  Pain Orientation: Lower Pain Descriptors / Indicators: Aching Pain Intervention(s): Music;Shower;Repositioned;Relaxation;Rest;Distraction;Hot/Cold interventions Pain Interference Pain Interference Pain Effect on Sleep: 2. Occasionally Pain Interference  with Therapy Activities: 1. Rarely or not at all Pain Interference with Day-to-Day Activities: 1. Rarely or not at all Vision/Perception  Vision - History Ability to See in Adequate Light: 0 Adequate Perception Perception: Within Functional Limits Praxis Praxis: WFL  Cognition Overall Cognitive Status: Within Functional Limits for tasks assessed Arousal/Alertness: Awake/alert Orientation Level: Oriented X4 Memory: Appears intact Awareness: Appears intact Problem Solving: Appears intact Safety/Judgment: Appears intact Sensation Sensation Light Touch: Impaired Detail Peripheral sensation comments: decreased LE sensation globally Light Touch Impaired Details: Impaired RLE;Impaired LLE Hot/Cold: Appears Intact Proprioception: Appears Intact Stereognosis: Not tested Coordination Gross Motor Movements are Fluid and Coordinated: No Fine Motor Movements are Fluid and Coordinated: No Coordination and Movement Description: ataxia improving in BUE and BLE, still notable Motor  Motor Motor: Ataxia Motor - Discharge Observations: ataxia, although greatly improved since eval  Mobility Bed Mobility Bed Mobility: Rolling Right;Rolling Left;Supine to Sit;Sit to Supine;Sitting - Scoot to Edge of Bed Rolling Right: Independent Rolling Left: Independent Supine to Sit: Independent with assistive device Sitting - Scoot to Edge of Bed: Independent with assistive device Sit to Supine: Independent with assistive device Transfers Transfers: Sit to Stand;Stand to Sit;Stand Pivot Transfers Sit to Stand: Independent with assistive device Stand to Sit: Independent with assistive device Stand Pivot Transfers: Independent with assistive device Stand Pivot Transfer Details: Verbal cues for safe use of DME/AE Transfer (Assistive device): Rolling walker Locomotion  Gait Ambulation: Yes Gait Assistance: Independent with assistive device Gait Distance (Feet): 150 Feet Assistive device: Rolling  walker Gait Gait: Yes Gait Pattern: Right genu recurvatum;Left genu recurvatum;Step-through pattern;Decreased dorsiflexion - right;Decreased dorsiflexion - left Gait velocity: decr Stairs / Additional Locomotion Stairs: Yes Stairs Assistance: Contact Guard/Touching assist Stair Management Technique: Two rails Ramp: Supervision/Verbal cueing Curb: Contact Guard/Touching assist Pick up small object from the floor (from standing position) activity did  not occur: Safety/medical concerns (did not attempt for safety) Naval architect Mobility: Yes Wheelchair Assistance: Independent with Scientist, research (life sciences): Both lower extermities Distance: 150 (limited d/t LE fatigue)  Trunk/Postural Assessment  Cervical Assessment Cervical Assessment: Within Functional Limits Thoracic Assessment Thoracic Assessment: Within Functional Limits Lumbar Assessment Lumbar Assessment: Within Functional Limits Postural Control Postural Control: Within Functional Limits  Balance Balance Balance Assessed: Yes Static Sitting Balance Static Sitting - Balance Support: Feet supported Static Sitting - Level of Assistance: 6: Modified independent (Device/Increase time) Dynamic Sitting Balance Dynamic Sitting - Balance Support: Feet supported;During functional activity Dynamic Sitting - Level of Assistance: 6: Modified independent (Device/Increase time) Dynamic Sitting - Balance Activities: Reaching for objects;Forward lean/weight shifting Sitting balance - Comments: sits unsupported EOB Static Standing Balance Static Standing - Balance Support: Bilateral upper extremity supported;During functional activity Static Standing - Level of Assistance: 6: Modified independent (Device/Increase time) Dynamic Standing Balance Dynamic Standing - Balance Support: During functional activity;Bilateral upper extremity supported Dynamic Standing - Level of Assistance: 6: Modified independent  (Device/Increase time) Dynamic Standing - Balance Activities: Reaching for objects;Forward lean/weight shifting;Reaching across midline Extremity Assessment  RUE Assessment RUE Assessment: Exceptions to [facility] Passive Range of Motion (PROM) Comments: WFL Active Range of Motion (AROM) Comments: WFL, exception of wrist drop General Strength Comments: trace wrist movement, 4-/5 grip, 4+/5 proximal joints LUE Assessment LUE Assessment: Exceptions to [facility] Active Range of Motion (AROM) Comments: WFL exception of wrist drop General Strength Comments: trace wrist movement, 4-/5 grip, 4/5 proximal joints RLE Assessment RLE Assessment: Exceptions to [facility] General Strength Comments: DF/PF not tested d/t AFOs RLE Strength Right Hip Flexion: 3+/5 Right Knee Flexion: 4/5 Right Knee Extension: 4+/5 LLE Assessment LLE Assessment: Exceptions to [facility] General Strength Comments: DF/PF not tested d/t AFOs LLE Strength Left Hip Flexion: 4/5 Left Knee Flexion: 4/5 Left Knee Extension: 4+/5   Skilled Therapeutic Interventions/Progress Updates  Session 1: Pt missed 30 minutes d/t neuropsych consult. Pt received supine in bed and agreeable to therapy, no c/o pain at this time.   Pt performs bed mobility and tx to w/c with mod(I) with RW, dependently transported to car simulator for energy conservation. Pt ambulates up and down ramp with RW and SBA, uneven ground with CGA d/t balance concerns, and curb step with CGA and verbal cues for safety and technique. Re-iterated importance of having ramp at home because pt's wife is unable to provide necessary physical assist for a curb step. Pt performs car transfer mod(I) with RW. Pt has no questions at this time about these transfers.    Discussed pt's concerns about falling. Demo-ed fall recovery to practice in PM session. Pt left seated in w/c with all needs in reach.    Session 2: Pt received seated in w/c and agreeable to session focusing on fall recovery, no c/o  pain at this time. Pt dependently transported to therapy gym for energy conservation. Utilized teach back method to review fall recovery technique from this morning.   Pt lowered to ground with CGA onto floor mat. Pt able to get from long sitting in ground to stand via tall and half kneeling with BUEs supported on table with SBA. Performed floor transfer twice. Discussed circumstances when pt should wait for medical attention before a floor transfer in case of fall. Pt ambulated back to room with one seated rest break in arm-less chair, mod(I) with verbal cues for keeping shoulders down. Pt left supine in bed with all needs in reach and alarm on.  Collins Scotland 12/07/2023, 4:37 PM

## 2023-12-07 NOTE — Progress Notes (Signed)
 Occupational Therapy Discharge Summary  Patient Details  Name: Jonathan Luna MRN: [id number] Date of Birth: [birth year]  Date of Discharge from OT service:December 07, 2023  Today's Date: 12/07/2023 OT Individual Time: 1308-6578 & 1300-1415 OT Individual Time Calculation (min): 45 min & 75 min    Patient has met 8 of 8 long term goals due to improved activity tolerance, improved balance, postural control, ability to compensate for deficits, functional use of  RIGHT upper, RIGHT lower, LEFT upper, and LEFT lower extremity, and improved coordination.  Patient to discharge at overall Modified Independent level. Patient's care partner is independent to provide the necessary physical assistance at discharge. Pt wife, Clydie Braun, completing family education and training with OT/PT on 2/23 with good carryover of information received.   Reasons goals not met: All goals met  Recommendation:  Patient will benefit from ongoing skilled OT services in home health setting to continue to advance functional skills in the area of BADL, iADL, and Reduce care partner burden.  Equipment: BSC recommended, pt refused   Reasons for discharge: treatment goals met and discharge from hospital  Patient/family agrees with progress made and goals achieved: Yes  OT Discharge Precautions/Restrictions  Precautions Precautions: Fall Restrictions Weight Bearing Restrictions Per Provider Order: No Pain Pain Assessment Pain Scale: 0-10 Pain Score: 5  Pain Orientation: Lower Pain Descriptors / Indicators: Aching Pain Intervention(s): Music;Shower;Repositioned;Relaxation;Rest;Distraction;Hot/Cold interventions ADL ADL Eating: Modified independent Where Assessed-Eating: Chair Grooming: Modified independent Where Assessed-Grooming: Sitting at sink Upper Body Bathing: Modified independent Where Assessed-Upper Body Bathing: Shower Lower Body Bathing: Modified independent Where Assessed-Lower Body Bathing: Shower Upper  Body Dressing: Modified independent (Device) Where Assessed-Upper Body Dressing: Chair Lower Body Dressing: Modified independent Where Assessed-Lower Body Dressing: Standing at sink, Sitting at sink Toileting: Modified independent Where Assessed-Toileting: Teacher, adult education: Engineer, agricultural Method: Proofreader: Gaffer: Modified independent Web designer Method: Ship broker: Emergency planning/management officer, Grab bars, Walk in Electrical engineer Transfer: Modified independent Film/video editor Method: Designer, industrial/product: Grab bars, Transfer tub bench ADL Comments: Pt seated for entirety shower on tub bench for safety. Pt able to don/doff AFOs with increased time. Vision Baseline Vision/History: 1 Wears glasses Patient Visual Report: No change from baseline Vision Assessment?: No apparent visual deficits;Wears glasses for reading;Wears glasses for driving Perception  Perception: Within Functional Limits Praxis Praxis: WFL Cognition Cognition Overall Cognitive Status: Within Functional Limits for tasks assessed Arousal/Alertness: Awake/alert Orientation Level: Person;Place;Situation Person: Oriented Place: Oriented Situation: Oriented Memory: Appears intact Awareness: Appears intact Problem Solving: Appears intact Safety/Judgment: Appears intact Brief Interview for Mental Status (BIMS) Repetition of Three Words (First Attempt): 3 Temporal Orientation: Year: Correct Temporal Orientation: Month: Accurate within 5 days Temporal Orientation: Day: Correct Recall: "Sock": Yes, no cue required Recall: "Blue": Yes, no cue required Recall: "Bed": Yes, no cue required BIMS Summary Score: 15 Sensation Sensation Light Touch: Impaired Detail Peripheral sensation comments: decreased LE sensation globally, and hands/forearms Hot/Cold: Appears Intact Proprioception:  Appears Intact Stereognosis: Not tested Coordination Gross Motor Movements are Fluid and Coordinated: No Fine Motor Movements are Fluid and Coordinated: No Coordination and Movement Description: ataxia improving in BUE and BLE Motor  Motor Motor: Ataxia Motor - Discharge Observations: ataxia, although greatly improved since eval Mobility  Bed Mobility Bed Mobility: Rolling Right;Rolling Left;Supine to Sit;Sit to Supine;Sitting - Scoot to Edge of Bed Rolling Right: Independent Rolling Left: Independent Supine to Sit: Independent with assistive device Sitting - Scoot to Edge of Bed:  Independent with assistive device Sit to Supine: Independent with assistive device Transfers Sit to Stand: Independent with assistive device Stand to Sit: Independent with assistive device  Trunk/Postural Assessment  Cervical Assessment Cervical Assessment: Within Functional Limits Thoracic Assessment Thoracic Assessment: Within Functional Limits Lumbar Assessment Lumbar Assessment: Within Functional Limits Postural Control Postural Control: Within Functional Limits  Balance Balance Balance Assessed: Yes Static Sitting Balance Static Sitting - Balance Support: Feet supported Static Sitting - Level of Assistance: 6: Modified independent (Device/Increase time) Dynamic Sitting Balance Dynamic Sitting - Balance Support: Feet supported;During functional activity Dynamic Sitting - Level of Assistance: 6: Modified independent (Device/Increase time) Dynamic Sitting - Balance Activities: Reaching for objects;Forward lean/weight shifting Static Standing Balance Static Standing - Balance Support: Bilateral upper extremity supported;During functional activity Static Standing - Level of Assistance: 6: Modified independent (Device/Increase time) Dynamic Standing Balance Dynamic Standing - Balance Support: During functional activity;Bilateral upper extremity supported Dynamic Standing - Level of Assistance: 6:  Modified independent (Device/Increase time) Dynamic Standing - Balance Activities: Reaching for objects;Forward lean/weight shifting;Reaching across midline Extremity/Trunk Assessment RUE Assessment RUE Assessment: Exceptions to [facility] Passive Range of Motion (PROM) Comments: WFL Active Range of Motion (AROM) Comments: WFL, exception of wrist drop General Strength Comments: trace wrist movement, 4-/5 grip, 4+/5 proximal joints LUE Assessment LUE Assessment: Exceptions to [facility] Active Range of Motion (AROM) Comments: WFL exception of wrist drop General Strength Comments: trace wrist movement, 4-/5 grip, 4/5 proximal joints   Session 1 General: "Let's play some ball!" Pt supine in bed upon OT arrival, agreeable to OT session.  Pain: see above  ADL:Pt got out of bed at mod I, OT provided assistance with donning AFOs for energy conservation. Pt ambulated from room>therapy gym at mod I with RW and no LOB/SOB.   Exercises: Pt completed throwing/receiving 1.1# small medicine ball in order to increase UB strength/endurance and wrist stability for ADLs such as grooming. Pt completed multiple trials of task at ~80% accuracy with throwing/catching and demonstrating increased wrist stabilization when holding ball above head with UE. Pt switching in between RUE/LUE.   Pt seated in W/C at end of session with W/C alarm donned, call light within reach and 4Ps assessed.    Session 2 General: "I'll miss you guys!" Pt seated in W/C upon OT arrival, agreeable to OT  Pain: see above  ADL: Pt completed full ADL at Mod I overall. Pt ambulated around room with RW and able to retrieve clothing out of drawers without LOB. Pt able to now manipulate soap containers in shower. Pt reported feeling comfortable with D/C. OT reiterated energy conservation strategies during ADLs in order to have enough energy to complete full day once D/C.    Pt seated in W/C at end of session with W/C alarm donned, call light within  reach and 4Ps assessed.    Velia Meyer, OTD, OTR/L 12/07/2023, 4:28 PM

## 2023-12-08 LAB — QUANTIFERON-TB GOLD PLUS: QuantiFERON-TB Gold Plus: UNDETERMINED — AB

## 2023-12-08 LAB — QUANTIFERON-TB GOLD PLUS (RQFGPL)
QuantiFERON Mitogen Value: 0.08 [IU]/mL
QuantiFERON Nil Value: 0 [IU]/mL
QuantiFERON TB1 Ag Value: 0.03 [IU]/mL
QuantiFERON TB2 Ag Value: 0.03 [IU]/mL

## 2023-12-08 MED ORDER — GABAPENTIN 300 MG PO CAPS: 900.0000 mg | ORAL_CAPSULE | Freq: Three times a day (TID) | ORAL | 11 refills | Status: DC

## 2023-12-08 MED ORDER — ACETAMINOPHEN 325 MG PO TABS: 325.0000 mg | ORAL_TABLET | ORAL | Status: DC | PRN

## 2023-12-08 MED ORDER — CLONAZEPAM 0.5 MG PO TABS: 0.5000 mg | ORAL_TABLET | Freq: Two times a day (BID) | ORAL | 0 refills | Status: DC | PRN

## 2023-12-08 MED ORDER — AMLODIPINE BESYLATE 5 MG PO TABS: 5.0000 mg | ORAL_TABLET | Freq: Every day | ORAL | 0 refills | Status: DC

## 2023-12-08 MED ORDER — DIPHENHYDRAMINE HCL 25 MG PO CAPS: 25.0000 mg | ORAL_CAPSULE | Freq: Four times a day (QID) | ORAL | Status: DC | PRN

## 2023-12-08 MED ORDER — PREDNISONE 20 MG PO TABS: 60.0000 mg | ORAL_TABLET | Freq: Every day | ORAL | 0 refills | Status: DC

## 2023-12-08 MED ORDER — SULFAMETHOXAZOLE-TRIMETHOPRIM 800-160 MG PO TABS
1.0000 | ORAL_TABLET | Freq: Every day | ORAL | 0 refills | Status: DC
Start: 2023-12-08 — End: 2024-01-19

## 2023-12-08 MED ORDER — METOPROLOL SUCCINATE ER 25 MG PO TB24: 25.0000 mg | ORAL_TABLET | Freq: Every day | ORAL | 0 refills | Status: DC

## 2023-12-08 NOTE — Progress Notes (Signed)
 PROGRESS NOTE   Subjective/Complaints:  Pt reports bowels regular-  Said had BM since 2/22 which is when chart shows LBM.   Pt ready for d/c- still concerned about IVIG and f/u with Dr Franz Dell would reach out to her.   ROS:  Pt denies SOB, abd pain, CP, N/V/C/D, and vision changes   Cold feet -continued  Objective:   No results found.  Recent Labs    12/07/23 0616  WBC 12.2*  HGB 14.4  HCT 43.1  PLT 345       Recent Labs    12/07/23 0616  NA 136  K 4.0  CL 102  CO2 26  GLUCOSE 81  BUN 12  CREATININE 0.79  CALCIUM 8.9       Intake/Output Summary (Last 24 hours) at 12/08/2023 0805 Last data filed at 12/08/2023 0445 Gross per 24 hour  Intake 240 ml  Output 1925 ml  Net -1685 ml        Physical Exam: Vital Signs Blood pressure 133/85, pulse 62, temperature 98.3 F (36.8 C), temperature source Oral, resp. rate 18, height 5\' 7"  (1.702 m), weight 82.5 kg, SpO2 99%.     General: awake, alert, appropriate, sitting up I nbed;  NAD HENT: conjugate gaze; oropharynx moist CV: regular rate and rhythm; no JVD Pulmonary: CTA B/L; no W/R/R- good air movement GI: soft, NT, ND, (+)BS Psychiatric: appropriate- less frustrated Neurological: Ox3  MSK  RUE- deltoid/Biceps/triceps 4+/5; WE 1/5; grip 2/5 and FA 2-/5- same as 2/23 LLE- Biceps/triceps 4/5; WE 1/5; grip 2+/5 and FA 1/5- rechecked today- is same LE's HF 4+/5; KE/KF 4+/5; DF 2/5 and PF 3+/5 B/L     Last done 11/23/23 Biceps, triceps, and shoulder abduction 4+/5; WE 0/5; Grip 3/5 and on R FA 1/5 and on L 1/5 RLE- HF 4 to 4+/5 KE 4/5; DF 0/5; PF 3+/5 LLE- HF 4 to 4+/5; KE 4/5; DF 0/5 and PF 3+/5 Extremities:- sensation:  Decreased to light touch from elbows to finger tips B/L. L>R    Assessment/Plan: 1. Functional deficits which require 3+ hours per day of interdisciplinary therapy in a comprehensive inpatient rehab  setting. Physiatrist is providing close team supervision and 24 hour management of active medical problems listed below. Physiatrist and rehab team continue to assess barriers to discharge/monitor patient progress toward functional and medical goals  Care Tool:  Bathing    Body parts bathed by patient: Right arm, Left lower leg, Face, Left arm, Chest, Abdomen, Front perineal area, Buttocks, Right upper leg, Left upper leg, Right lower leg         Bathing assist Assist Level: Independent with assistive device     Upper Body Dressing/Undressing Upper body dressing   What is the patient wearing?: Pull over shirt    Upper body assist Assist Level: Independent with assistive device    Lower Body Dressing/Undressing Lower body dressing      What is the patient wearing?: Pants     Lower body assist Assist for lower body dressing: Independent with assitive device     Toileting Toileting    Toileting assist Assist for toileting: Independent with assistive device  Transfers Chair/bed transfer  Transfers assist     Chair/bed transfer assist level: Independent with assistive device Chair/bed transfer assistive device: Armrests, Geologist, engineering   Ambulation assist      Assist level: Independent with assistive device Assistive device: Walker-rolling Max distance: 150   Walk 10 feet activity   Assist     Assist level: Independent with assistive device Assistive device: Walker-rolling   Walk 50 feet activity   Assist    Assist level: Independent with assistive device Assistive device: Walker-rolling    Walk 150 feet activity   Assist Walk 150 feet activity did not occur:  (fatigue and imbalance)  Assist level: Independent with assistive device Assistive device: Walker-rolling    Walk 10 feet on uneven surface  activity   Assist Walk 10 feet on uneven surfaces activity did not occur: Safety/medical concerns (did not attempt  d/t pt fatigue)   Assist level: Contact Guard/Touching assist Assistive device: Walker-rolling   Wheelchair     Assist Is the patient using a wheelchair?: Yes Type of Wheelchair: Manual Wheelchair activity did not occur: Safety/medical concerns  Wheelchair assist level: Independent Max wheelchair distance: 150    Wheelchair 50 feet with 2 turns activity    Assist    Wheelchair 50 feet with 2 turns activity did not occur:  (fatigue and imbalance)   Assist Level: Independent   Wheelchair 150 feet activity     Assist  Wheelchair 150 feet activity did not occur:  (fatigue and imbalance)   Assist Level: Independent   Blood pressure 133/85, pulse 62, temperature 98.3 F (36.8 C), temperature source Oral, resp. rate 18, height 5\' 7"  (1.702 m), weight 82.5 kg, SpO2 99%.  Medical Problem List and Plan: 1. 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   D/w pt about Vygart and will d/w him more in AM- per Neuro, don't have access to it in hospital.   Rituximab on hold right now- Quantiferon was equivocal- might place TB test  To do IVIG qweek  maintenance- for 2 days- will do outpt as well - will do Friday?Saturday again  Will schedule IVIG for Thursday and Friday evening at 6pm- d/w pharmacy  2/21- got a dose last night- no side effects except maybe feeling swollen  2/22 continues to feel like his hands are swollen.  Per neurology, patient to defer Rituxan discussion to outpatient.  Weekly IVIG.  Bactrim started for PCP prophylaxis  2/23 reviewed neurology follow-up plan, IVIG treatments.  He is still thinking about Rituxan and plans to discuss with Dr. Terrace Arabia outpatient.   2/24- pt asking about Vygart- will need to  look at in outpt- it's not available in the hospital.   2/25- d/w pt about Vygart 2.  Antithrombotics: -DVT/anticoagulation:  Pharmaceutical: Lovenox             -antiplatelet therapy: aspirin 81 mg daily   3. Pain Management: Tylenol as needed             -continue gabapentin 900 mg TID and 900 mg daily PRN             -  continue Percocet 5/325 q 4 hours PRN  -Continue Cymbalta 60 mg daily   -Consider trial of Lyrica to replace gabapentin   2/6- will increase Duloxetine to 90 mg daily and stop Zoloft so can do so.   2/9 pain improving. Continue plan above  2/12- pt reports still a major issue- but will increase Duloxetine to 120 mg daily and monitor  2/14- no improvement so far, but just started yesterday with increased dose- 2-15: Ongoing severe back pain overnight.  Is using Percocet consistently every 4 hours, has not used Tylenol since 2-1.  Did use as needed gabapentin overnight, with benefit. 2/17- taking pain meds regularly.  2/19- asking ~ q4 hours for pain meds 2/23 patient still using as needed Percocet.  Discussed that he can use his as needed gabapentin-has not received this in the last few nights 2/24- still on Percocet and taking frequently- will see if he can reduce to q6 hours 2/25- d/w pt- will try to wean down to q6 hours- with goal to wean more- will give 5-7 days of meds- and then call office to get refills.  4. Mood/Behavior/Sleep: LCSW to evaluate and provide emotional support             -continue Cymbalta 60 mg daily             -continue Zoloft 25 mg daily             -continue clonazepam 0.5 mg BID prn             -antipsychotic agents: n/a  -Patient requested increase of clonazepam due to anxiety.  He takes this medication chronically PDMP reviewed, will increase temporarily to 1 mg twice daily as needed  -1/31 pt reports anxiety is improved   2/4- pt reports anxiety is better due to Prednisone same with irritability.   2/10- appears more anxious since recovery  not going as expected  2/13 -2/17- Anxiety getting worse as day of d/c gets closer  2/18- "terrified" about not getting IVIG after d/c and having to "start back at beginning"- reached out to Dr Terrace Arabia to try and assist. 2/21- pt asking for Rituximab once gets out of rehab  2/25- pt asking for Vygart once leaves rehab- explained will need to d/w Dr Terrace Arabia 5. Neuropsych/cognition: This patient is capable of making decisions on his own behalf.   6. Skin/Wound Care: Routine skin care checks   7. Fluids/Electrolytes/Nutrition: Routine Is and Os and follow-up chemistries   8: Hypertension: monitor TID and prn             -continue Avapro 300 mg daily             -continue Toprol-XL 25 mg daily  -1/31 controlled, continue current      12/08/2023    6:01 AM 12/07/2023    8:22 PM 12/07/2023    3:43 PM  Vitals with BMI  Systolic 133 110 130  Diastolic 85 77 83  Pulse 62 73 84  2/1 Will reduce losartan to 75 mg, start amlodipine 5 mg and monitor- see no. 14 BP controlled will try on amlodipine alone 2/3 BP controlled continue current regimen  2/13- slightly elevated this AM, however was soft last evening- won't make any changes yet  2/14- BP controlled- but can increase Norvasc to 5 mg over weekend if need be for foot coldness  2/17- BP controlled on Norvasc-   2/18- BP soft today- but will monitor for [facility]- otherwise, con't regimen  2/19- Explained ot pt cannot increase Norvasc because BP on soft side- and would drop his BP- so on max dose he can tolerate  2/20-2/21 BP very slightly elevated- but is rare, will con't regimen  2/23 BP controlled overall, continue current regimen and monitor, could consider going up on amlodipine as BP has been less soft  2/24- BP running 110-117 except in AM-  9: Hypothyroidism: continue Synthroid 125 mcg daily   10: Tobacco use: cessation counseling -Nicorette gum as needed (not using)             -? cough>> on scheduled Mucinex-DM BID   11: CIDP -continue  prednisone 40 mg daily -daily VC and NIF (? end date?) -follow-up Dr. Terrace Arabia -1/30 respiratory function- NIF >-40 and IS 2500, stable. Continue to monitor -2/3 NIF/IS stable   2/4- NIF's VC OK- on ICS actually- spoke with Dr Terrace Arabia- she wants [name] to do another dosing of IVIG over a total of 4 days- 2g/kg total dose over 4 days- will arrange with Pharmacy- spoke with them as well- will also increase Prednisone to 60 mg daily- since I think it was decreased- supposed to be on until f/u with Dr Terrace Arabia- needs BMP daily per pharmacy while on IVIG.   2/11- will reach out to to Dr Terrace Arabia since strength not too much better after IVIG-    2/12- haven't heard back from Dr Terrace Arabia- will wait to hear something- explained to pt that I don't know if there's another tx- at least form my experience other than what's been tried.   2/13- waiting to have pt seen by Neuro- d/w them this AM- they will help narrow down plan.   2/14- started IVIG again x 2 days- waiting for the moment on Rituximab - 2-15: CT with resolving right upper lobe scarring, no other concerning masses for neoplastic process.  Neurology holding rituximab until QuantiFERON gold results. Unable to do Paraneoplastic panel as unreliable after IVIG.  --2/16 QuantiFERON still pending, per neurology patient is feeling 50% better after IVIG; holding off on paraneoplastic panel and rituximab  2/17- Going to do IVIG qweek for 2 days/week- will d/w Neuro when next to do IVIG- Friday?  2/18- will do IVIG in evening so doesn't interfere with therapy- on Friday/Saturday it appears unless Neuro has other recs.   2/19- changed IVIG to TH/Friday at 6pm- d/w pharmacy- also they are waiting on QuantiFERON test that should come back 2/20- to see if can do Rituximab  2/20- test came back equivocal- per Neuro- will get regular skin TB test after d/w ID- and see what that shows and go from there  2/1- reordered Quantiferon- and pt interested in Rituximab as well  2/23 quantiferon  pending  2/24- Pt started on Bactrim 1 tab daily to prevent PCP pneumonia- and pt asking about Vygart- d/w neuro- will get back to pt about it.   2/25- d/w pt about Vygart that it's not in hospital, only outpt- due to insurance.  12: Leukocytosis: likely steroid induced, improving; no fever              -follow-up CBC with differential Thursday 1/30 and q Monday  1/3 WBC elevated but stable at 15,  likely steroid related  2/10- WBC 11.9- doing better on steroids  Recheck tomorrow    Latest Ref Rng & Units 12/07/2023    6:16 AM 11/30/2023    5:23 AM 11/23/2023    5:40 AM  CBC  WBC 4.0 - 10.5 K/uL  12.2  11.2  11.9   Hemoglobin 13.0 - 17.0 g/dL 16.1  09.6  04.5   Hematocrit 39.0 - 52.0 % 43.1  41.8  40.8   Platelets 150 - 400 K/uL 345  310  347     13.  Elevated ALT  -2/3 improved to 48 today  2/10- will recheck next week   2/18- ALT down to 41- is normal range  14. Cold feeling in b/l legs.  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  EXTR warm, pulses are normal, do not think he needs arterial Dopplers.  Likely autonomic, trial calcium channel blocker  2/5- so far, no improvement with Norvasc 5 mg daily-   2/6- pt emphatic that it's the worst part of his pain- he also describes feet feeling wet - but are dry and warmish to touch- will increase Duloxetine to 90 mg daily and sotp Zoloft.   2/7- pt agreed to change- said nerve pain/cold in feet slightly better already- thinks Norvasc made him feel fatigued, so wants to stop   2/9 pt willing to try norvasc again for sx. Feels that it helped   -will start low dose 2.5mg  daily   2/10- will con't for now- pt told me didn't help initially, but we will see  2/11- pt says yesterday cold was SO bad, it was really painful- will con't Amlodipine. 2/12- Will increase Duloxetine to 120 mg daily- is really bothering him. Wait to increase Norvasc today  2/13- no change, but has noticed when  leaves feet down at least 15 minutes, feet get colder 2/14- can increase  Norvasc to 5 mg over the weekend 2/15: Cold sensitivity seems dependent on positioning, patient in bed today and not bothered, will hold further medication for now 2/16: Increase Norvasc for sensitivity to 5 mg daily 2/17- no change in cold feelings of fet- wife bringing in wool socks for him.  2/18- pt got him wool socks- pt perseverates on topic- no change so far 2/22 consider increase Norvasc dose if BP remains stable 2/24- BP lower again- I'm concerned he will drop too much when at 110s systolic during day already.  15. Constipation  2/20- LBM 2 days ago  2/21- LBM yesterday  2/23 LBM yesterday- improved    I spent a total of 35   minutes on total care today- >50% coordination of care- due to full exam of strength exam; as well as d/w pt about d/c plans- will also d/w PA.    The patient is medically ready for discharge to home and will need follow-up with [facility]. In addition, they will need to follow up with their PCP, Neurology.     LOS: 28 days A FACE TO FACE EVALUATION WAS PERFORMED  Meilyn Heindl 12/08/2023, 8:05 AM

## 2023-12-08 NOTE — Progress Notes (Signed)
 Inpatient Rehabilitation Discharge Medication Review by a Pharmacist  A complete drug regimen review was completed for this patient to identify any potential clinically significant medication issues.  High Risk Drug Classes Is patient taking? Indication by Medication  Antipsychotic No   Anticoagulant No   Antibiotic Yes PRN Percocet - severe pain  Opioid Yes Sulfamethoxazole-trimethoprim - PCP prophylaxis  Antiplatelet Yes Aspirin 81 mg - CAD prevention  Hypoglycemics/insulin No   Vasoactive Medication Yes Amlodipine, metoprolol XL - hypertension  Chemotherapy No   Other Yes Duloxetine - depression, neuropathic pain Gabapentin - neuropathic pain Fluticasone nasal spray - allergies, rhinitis Levothyroxine - hypothyroidism Prednisone - CIDP Vitamin D - supplement  PRNs: Acetaminophen - mild pain Albuterol inhaler - wheezing, shortness of breath Diphenhydramine - itching Clonazepam - anxiety Guaifenesin-dextromethorphan - cough Miralax - constipation     Type of Medication Issue Identified Description of Issue Recommendation(s)  Drug Interaction(s) (clinically significant)     Duplicate Therapy     Allergy     No Medication Administration End Date     Incorrect Dose     Additional Drug Therapy Needed     Significant med changes from prior encounter (inform family/care partners about these prior to discharge). New: amlodipine, prednisone, suflamethoxazole-trimethoprim. Duloxetine, gabapentin and clonazepam doses increased. Discontinued:  irbesartan, sertraline, prn tizanidine. Communicate changes with patient/family prior to discharge.  Other       Clinically significant medication issues were identified that warrant physician communication and completion of prescribed/recommended actions by midnight of the next day:  No  Name of provider notified for urgent issues identified:   Provider Method of Notification:   Pharmacist comments:  - Prednisone 60 mg daily to  continue until seen by Neuro as outpatient.  Sulfamethoxazole-trimethoprim for PCP prophylaxis while on high dose Prednisone.   Time spent performing this drug regimen review (minutes):  20   Dennie Fetters, [place] 12/08/2023 7:44 AM

## 2023-12-08 NOTE — Progress Notes (Signed)
 Inpatient Rehabilitation Care Coordinator Discharge Note   Patient Details  Name: Jonathan Luna MRN: [id number] Date of Birth: 1966-06-26   Discharge location: HOME WITH WIFE WHO DOES WORK DURING THE DAY-THERE IN THE EVENINGS  Length of Stay: 28 DAYS  Discharge activity level: MOD/I-SUPERVISION LEVEL  Home/community participation: ACTIVE  Patient response WU:JWJXBJ Literacy - How often do you need to have someone help you when you read instructions, pamphlets, or other written material from your doctor or pharmacy?: Never  Patient response YN:WGNFAO Isolation - How often do you feel lonely or isolated from those around you?: Never  Services provided included: MD, RD, PT, OT, RN, CM, TR, Pharmacy, Neuropsych, SW  Financial Services:  Field seismologist Utilized: Production designer, theatre/television/film  Choices offered to/list presented to: PT AND WIFE  Follow-up services arranged:  Home Health, Patient/Family has no preference for HH/DME agencies, DME Home Health Agency: [facility]  PT & OT    DME : [facility]  3 IN 1 HAS ROLLATOR FROM PAST ADMISSION AND IS GETTING A ROLLING WALKER ON OWN SINCE NOT COVERED SERVANT [facility] REFERRAL FOR SSD APPLICATION WHILE HERE   Patient response to transportation need: Is the patient able to respond to transportation needs?: Yes In the past 12 months, has lack of transportation kept you from medical appointments or from getting medications?: No In the past 12 months, has lack of transportation kept you from meetings, work, or from getting things needed for daily living?: No   Patient/Family verbalized understanding of follow-up arrangements:  Yes  Individual responsible for coordination of the follow-up plan: SELF AND KAREN-WIFE [phone]  Confirmed correct DME delivered: Lucy Chris 12/08/2023    Comments (or additional information):WIFE WS IN OVER THE WEEKEND FOR EDUCATION BOTH COMFORTABLE WITH CARE NEEDS. PT DID WELL NAD REACHED GOALS TO  BE SAFE TO BE HOME ALONE WHILE WIFE WORKS. WILL TRANSITION TO OP ONCE DOING BETTER.   Summary of Stay    Date/Time Discharge Planning CSW  12/01/23 [phone] Home with wife who is blind and works during the day. Pt making progress but limited by pain. Have set up [facility] home health at [place]. Await DME recommendations RGD  11/24/23 1000 pt is mkaing progress this week in therapies. Needs to be mod/i to go home since wife is blind and works. Will see if can go back to OP or will need to being with [facility]. SSD referral made and seeing neuro-psych RGD  11/17/23 0835 Home with wife who is blind and currently working during the day. Was going to [facility] prior to admission RGD       Quinci Gavidia, Lemar Livings

## 2023-12-09 ENCOUNTER — Telehealth: Payer: Self-pay | Admitting: Neurology

## 2023-12-09 NOTE — Telephone Encounter (Signed)
 Liji PT with [facility] has called for verbal orders for PT 1 week 2 2 week 3 1 week 2 Her vm is secure

## 2023-12-09 NOTE — Telephone Encounter (Signed)
 Verbals orders given and left on secure voicemail as requested

## 2023-12-10 ENCOUNTER — Telehealth: Payer: Self-pay

## 2023-12-10 NOTE — Telephone Encounter (Signed)
 Transition Care Management Unsuccessful Follow-up Telephone Call  Date of discharge and from where:  [facility]   Attempts:  1st Attempt  Reason for unsuccessful TCM follow-up call:  Left voice message

## 2023-12-14 ENCOUNTER — Ambulatory Visit (INDEPENDENT_AMBULATORY_CARE_PROVIDER_SITE_OTHER): Admitting: Neurology

## 2023-12-14 ENCOUNTER — Other Ambulatory Visit: Payer: Self-pay

## 2023-12-14 DIAGNOSIS — R202 Paresthesia of skin: Secondary | ICD-10-CM | POA: Diagnosis not present

## 2023-12-14 DIAGNOSIS — G6181 Chronic inflammatory demyelinating polyneuritis: Secondary | ICD-10-CM | POA: Diagnosis not present

## 2023-12-14 DIAGNOSIS — R269 Unspecified abnormalities of gait and mobility: Secondary | ICD-10-CM | POA: Diagnosis not present

## 2023-12-14 NOTE — Progress Notes (Signed)
 ASSESSMENT AND PLAN  Jonathan Luna is a 58 y.o. 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  MRI of neural axis, showed no findings to explain above difficulties,  Mild improvement with IVIG treatment in Dec, 2024, now progressively worse, with worsening gait abnormality, distal weakness,   He had no significant improvement plasma exchange, only mild improvement with continued IVIG treatment  Continue Out patient IVIG 2g/kg loading, followed by 1g/kg every 3 weeks.  He had significant weakness also progressing symptoms while receiving IVIG treatment, discussed with patient, we will proceed with rituximab  Neuropathic pain:  Taking gabapentin 300 mg 3 tablets 3 times a day, Cymbalta 60 mg daily, also on Percocet,      DIAGNOSTIC DATA (LABS, IMAGING, TESTING) - I reviewed patient records, labs, notes, testing and imaging myself where available.   MEDICAL HISTORY:  Jonathan Luna is a 58 year old male accompanied by his wife, seen in request by his primary care doctor Jonathan Luna for evaluation of rapid onset gait abnormality, initial evaluation September 22, 2023  History is obtained from the patient and review of electronic medical records. I personally reviewed pertinent available imaging films in PACS.   PMHx of  Depression, Anxiety Chronic low back pain, on percocet 5/325 tid by Dr. Yetta Luna HTN Pneumonia Hereditary cataract,  Left retinal detachment,  Right blood vessel   He suffered upper respiratory symptoms, pneumonia, was treated with antibiotic in the middle of October 2024.  Azithromycin, subsequently Augmentin  Few days later, he began to noticed numbness tingling starting at the bottom of his  feet, toes, also burning sensation, by August 10, 2023, he noticed mild bilateral hands paresthesia, then had gradual onset gait abnormality, by November 11, when he walked to the car, he noticed unsteady gait,  His gait abnormality continue to getting worse, fell few times, lower extremity buckled underneath him, then around November 15, he began to have upper extremity symptoms, feel weak, shaky when holding object, shallow breathing, out of breath easily  She presented to emergency room August 28, 2023, heart rate documented 127, temperature of 101, blood pressure 157/100, was diagnosed with sepsis due to right upper lobe pneumonia, D-dimer was positive, PE protocol was negative, which CT chest showed posterior right upper lobe airspace and groundglass opacity, respiratory virus panel was negative  He was treated with IV ceftriaxone, azithromycin, followed by p.o. doxycycline, cefdinir, his upper respiratory symptoms has much improved, however, since discharge, he had persistent gait abnormality, weird achy pain involving upper and lower extremity, hypersensitivity of the neck running up and down his spine, twitching inside his body,  He drove himself, shop at [facility] on September 04, 2023, felt such gait abnormality, stiff weak lower extremity, shortness of breath, by the time he left [employer], he have to rely on a cart, to pull himself, see since then he relied on his wheelchair, has difficulty bearing weight  UPDATE September 30, 2023: He continues to have profound gait abnormality, no improvement  MRI of the brain  showed no significant abnormality  MRI of cervical and thoracic spine showed mild degenerative changes, no cord or nerve root compression, MRI lumbar spine showed prominent disc and facet degenerative changes, throughout, most prominent L4-5, moderate right, mild foraminal narrowing  Extensive laboratory  evaluations showed elevated TSH, otherwise normal negative HIV, RPR, vitamin D,  ANA A1c ESR C-reactive protein Lyme titer, vitamin D, protein electrophoresis, Lyme titer,  UPDATE Jan 16th 2025: He was admitted following last visit on September 30, 2023, received IVIG treatment, completed on October 04, 2023, reported mild improvement, was able to walk better, followed by inpatient rehabilitation, Lumbar puncture October 12, 2023, total protein of 139, WBC of 12, 84% lymphocyte, monocyte 14%  MRI of the brain December 19 was normal,  MRI of lumbar: Lower lumbar degenerative changes most noticeable L4-5, no canal stenosis moderate foraminal narrowing  MRI of thoracic spine only mild degenerative changes,  MRI of cervical spine, mild degenerative changes  But benefit from the IVIG from December only last for 3 weeks, over the past few weeks, he complains slow decline in functional status, now with profound bilateral upper and lower extremity weakness increased gait abnormality, shortness of breath with minimum exertion, was not able to continue his home PT,  Also complains of worsening neuropathic pain taking gabapentin 2400 mg daily,  Recent labs showed hyponatremia sodium 130, drink large quantity of free water at home  UPDATE December 14 2023: Patient was sent to hospital admission following last visit on October 29, 2023, underwent plasma exchange with no significant improvement in his weakness, gait abnormality  He was reloaded with IVIG on November 17, 2023, again on November 25, 2018  He only noticed mild improvement, his gait has improved some with bilateral AFO, physical therapy, ambulated with a walker  Laboratory evaluation from February 2024, TB QuantiFERON was negative, CMP showed mild decreased albumin of 3.1, CBC showed hemoglobin of 13.8,  He also remained on prednisone 60 mg daily  PHYSICAL EXAM:      12/08/2023    6:01 AM 12/07/2023    8:22 PM 12/07/2023    3:43 PM  Vitals with BMI  Systolic 133 110 409  Diastolic 85 77 83  Pulse 62 73 84      PHYSICAL EXAMNIATION:  Gen: NAD, conversant, well nourised, well groomed                     Cardiovascular: Regular rate rhythm, no peripheral edema, warm, nontender. Eyes: Conjunctivae clear without exudates or hemorrhage Neck: Supple, no carotid bruits. Pulmonary: Clear to auscultation bilaterally   NEUROLOGICAL EXAM:  MENTAL STATUS: Speech/cognition: Awake, alert, oriented to history taking and casual conversation CRANIAL NERVES: CN II: Visual fields are full to confrontation.  Postsurgical irregular pupil  CN III, IV, VI: extraocular movement are normal. No ptosis. CN V: Facial sensation is intact to light touch CN VII: Face is symmetric with normal eye closure  CN VIII: Hearing is normal to causal conversation. CN IX, X: Phonation is normal. CN XI: Head turning and shoulder shrug are intact  MOTOR: No significant bilateral upper extremity proximal weakness, profound distal weakness, no antigravity movement below wrist  mild bilateral hip flexion weakness, moderate to severe bilateral ankle dorsiflexion, plantarflexion weakness,  REFLEXES: Areflexia  SENSORY: Well-preserved toe vibratory sensation, proprioception, pinprick,    COORDINATION: Moderate truncal ataxia, mild  bilateral finger-to-nose heel-to-shin dysmetria  GAIT/STANCE: Need push-up to get up from seated position, rely on his walker, wide-based unsteady, ataxic gait REVIEW OF SYSTEMS:  Full 14 system review of systems performed and notable only for as above All other review of systems were negative.   ALLERGIES: Allergies  Allergen Reactions   Amoxil [Amoxicillin] Other (See Comments)    Pt states that  it does not work for him when he had back to back pneumonia   Effexor [Venlafaxine] Diarrhea   Prozac [Fluoxetine Hcl] Diarrhea    HOME MEDICATIONS: Current Outpatient Medications  Medication Sig Dispense Refill   acetaminophen (TYLENOL) 325 MG tablet Take 1-2 tablets (325-650 mg total) by mouth  every 4 (four) hours as needed for mild pain (pain score 1-3).     albuterol (VENTOLIN HFA) 108 (90 Base) MCG/ACT inhaler Inhale 1-2 puffs into the lungs every 6 (six) hours as needed for wheezing or shortness of breath. 1 each 0   amLODipine (NORVASC) 5 MG tablet Take 1 tablet (5 mg total) by mouth daily. 30 tablet 0   aspirin EC 81 MG tablet Take 81 mg by mouth daily. Swallow whole.     Cholecalciferol (VITAMIN D-3) 25 MCG (1000 UT) CAPS Take 1 capsule by mouth daily.     clonazePAM (KLONOPIN) 0.5 MG tablet Take 1 tablet (0.5 mg total) by mouth 2 (two) times daily as needed for anxiety. 15 tablet 0   diphenhydrAMINE (BENADRYL) 25 mg capsule Take 1 capsule (25 mg total) by mouth every 6 (six) hours as needed for itching.     DULoxetine (CYMBALTA) 60 MG capsule Take 1 capsule (60 mg total) by mouth daily. 30 capsule 11   fluticasone (FLONASE) 50 MCG/ACT nasal spray Place 2 sprays into both nostrils daily. 48 g 1   gabapentin (NEURONTIN) 300 MG capsule Take 3 capsules (900 mg total) by mouth 3 (three) times daily. 270 capsule 11   guaiFENesin-dextromethorphan (ROBITUSSIN DM) 100-10 MG/5ML syrup Take 5 mLs by mouth every 4 (four) hours as needed for cough.     metoprolol succinate (TOPROL XL) 25 MG 24 hr tablet Take 1 tablet (25 mg total) by mouth daily. 30 tablet 0   oxyCODONE-acetaminophen (PERCOCET/ROXICET) 5-325 MG tablet Take 1 tablet by mouth every 8 (eight) hours as needed for severe pain (pain score 7-10). 90 tablet 0   polyethylene glycol (MIRALAX / GLYCOLAX) 17 g packet Take 17 g by mouth daily as needed for mild constipation.     predniSONE (DELTASONE) 20 MG tablet Take 3 tablets (60 mg total) by mouth daily with breakfast. 90 tablet 0   sulfamethoxazole-trimethoprim (BACTRIM DS) 800-160 MG tablet Take 1 tablet by mouth daily. 30 tablet 0   UNITHROID 125 MCG tablet Take 1 tablet (125 mcg total) by mouth daily before breakfast. (Patient taking differently: Take 125 mcg by mouth at bedtime.)  90 tablet 0   No current facility-administered medications for this visit.    PAST MEDICAL HISTORY: Past Medical History:  Diagnosis Date   Allergy    Anxiety    Arthritis    Cataract    Community acquired pneumonia 08/28/2023   Depression    Detached retina    Heart murmur    History of bilateral cataract extraction    Hyperlipidemia    Hypothyroidism    Low back pain    Panic attack    Panic attacks    Pneumonia of right upper lobe due to infectious organism 08/18/2023    PAST SURGICAL HISTORY: Past Surgical History:  Procedure Laterality Date   CATARACT EXTRACTION     EYE SURGERY     IR FLUORO GUIDE CV LINE RIGHT  10/29/2023   IR US GUIDE VASC ACCESS RIGHT  10/29/2023    FAMILY HISTORY: Family History  Problem Relation Age of Onset   Breast cancer Mother    Hypertension Father    Heart  disease Father    Asthma Father    Emphysema Father        smoker for 45 years   Diabetes Brother    Hyperlipidemia Brother     SOCIAL HISTORY: Social History   Socioeconomic History   Marital status: Married    Spouse name: Not on file   Number of children: 0   Years of education: Not on file   Highest education level: Not on file  Occupational History   Occupation: Cumputer operator/security clearance [employer]  Tobacco Use   Smoking status: Never    Passive exposure: Past   Smokeless tobacco: Never   Tobacco comments:    As a teenager  Vaping Use   Vaping status: Never Used  Substance and Sexual Activity   Alcohol use: Not Currently   Drug use: No   Sexual activity: Yes    Birth control/protection: Condom  Other Topics Concern   Not on file  Social History Narrative   Caffienated drinks-no   Seat belt use often-yes   Regular Exercise-yes   Smoke alarm in the home-yes   Firearms/guns in the home-no   History of physical abuse-no               Social Drivers of Health   Financial Resource Strain: Patient Declined (05/27/2023)   Overall  Financial Resource Strain (CARDIA)    Difficulty of Paying Living Expenses: Patient declined  Food Insecurity: No Food Insecurity (10/29/2023)   Hunger Vital Sign    Worried About Running Out of Food in the Last Year: Never true    Ran Out of Food in the Last Year: Never true  Transportation Needs: No Transportation Needs (10/29/2023)   PRAPARE - Administrator, Civil Service (Medical): No    Lack of Transportation (Non-Medical): No  Physical Activity: Insufficiently Active (05/27/2023)   Exercise Vital Sign    Days of Exercise per Week: 4 days    Minutes of Exercise per Session: 30 min  Stress: Stress Concern Present (05/27/2023)   [employer] of Occupational Health - Occupational Stress Questionnaire    Feeling of Stress : Rather much  Social Connections: Unknown (05/27/2023)   Social Connection and Isolation Panel [NHANES]    Frequency of Communication with Friends and Family: Patient declined    Frequency of Social Gatherings with Friends and Family: Once a week    Attends Religious Services: More than 4 times per year    Active Member of [employer] or Organizations: Yes    Attends Banker Meetings: More than 4 times per year    Marital Status: Patient declined  Intimate Partner Violence: Not At Risk (10/29/2023)   Humiliation, Afraid, Rape, and Kick questionnaire    Fear of Current or Ex-Partner: No    Emotionally Abused: No    Physically Abused: No    Sexually Abused: No      Levert Feinstein, M.D. Ph.D.  [facility] [address] [place], [place] [address] Ph: [phone] Fax: [phone]  CC:  [name], MD [address] [place],  [place] [address]  [name], MD

## 2023-12-15 ENCOUNTER — Telehealth: Payer: Self-pay | Admitting: Neurology

## 2023-12-15 ENCOUNTER — Ambulatory Visit: Payer: 59 | Admitting: Physician Assistant

## 2023-12-15 NOTE — Telephone Encounter (Signed)
 Lmtrc 1st attempt   Her msg didn't say secure voice mail so that's why I didn't leave verbal orders on there

## 2023-12-15 NOTE — Telephone Encounter (Signed)
 Patient need a a followup appointment with Dr. Terrace Arabia and he wasn't sure when she wanted to see him.She did stop in the infusion suite to check on him yesterday.

## 2023-12-15 NOTE — Telephone Encounter (Signed)
 When is the next time you want to see patient?

## 2023-12-15 NOTE — Telephone Encounter (Signed)
 error

## 2023-12-15 NOTE — Telephone Encounter (Signed)
 Give him a follow up with me in 6 -10 weeks, If he needs to talk with me during his infusion here, let infusion nurse get me

## 2023-12-15 NOTE — Telephone Encounter (Signed)
 [facility]/ [facility] requesting verbal order for occcupational therapy. Frequency:  2x wk for 3 wks for E-stem bilateral upper extremities,  muscle reeducation and strengthening,  fine motor coordination and adaptive equipment in the home, functional transfers.  Need order for social services

## 2023-12-15 NOTE — Telephone Encounter (Signed)
 I spoke with Jonathan Luna and provided verbal orders for occupational therapy and social services, per Dr. Terrace Arabia.

## 2023-12-15 NOTE — Telephone Encounter (Signed)
 Called and scheduled appt w/yan

## 2023-12-16 ENCOUNTER — Encounter: Payer: 59 | Attending: Physical Medicine and Rehabilitation | Admitting: Physical Medicine and Rehabilitation

## 2023-12-16 ENCOUNTER — Other Ambulatory Visit: Payer: Self-pay | Admitting: Internal Medicine

## 2023-12-16 DIAGNOSIS — E039 Hypothyroidism, unspecified: Secondary | ICD-10-CM

## 2023-12-23 ENCOUNTER — Telehealth: Payer: Self-pay

## 2023-12-23 NOTE — Telephone Encounter (Signed)
 Faxed off Home Health Certification and Plan of Care to [facility] [phone] on 12/23/2023

## 2023-12-28 ENCOUNTER — Encounter: Payer: Self-pay | Admitting: Neurology

## 2023-12-28 DIAGNOSIS — G6181 Chronic inflammatory demyelinating polyneuritis: Secondary | ICD-10-CM

## 2023-12-28 DIAGNOSIS — R0689 Other abnormalities of breathing: Secondary | ICD-10-CM

## 2023-12-29 DIAGNOSIS — R0689 Other abnormalities of breathing: Secondary | ICD-10-CM | POA: Insufficient documentation

## 2024-01-01 ENCOUNTER — Encounter: Payer: Self-pay | Admitting: Internal Medicine

## 2024-01-01 ENCOUNTER — Other Ambulatory Visit: Payer: Self-pay | Admitting: Internal Medicine

## 2024-01-01 DIAGNOSIS — G894 Chronic pain syndrome: Secondary | ICD-10-CM

## 2024-01-01 DIAGNOSIS — M51369 Other intervertebral disc degeneration, lumbar region without mention of lumbar back pain or lower extremity pain: Secondary | ICD-10-CM

## 2024-01-01 DIAGNOSIS — M5030 Other cervical disc degeneration, unspecified cervical region: Secondary | ICD-10-CM

## 2024-01-01 DIAGNOSIS — M545 Other chronic pain: Secondary | ICD-10-CM

## 2024-01-01 MED ORDER — OXYCODONE-ACETAMINOPHEN 5-325 MG PO TABS: 1.0000 | ORAL_TABLET | Freq: Three times a day (TID) | ORAL | 0 refills | Status: DC | PRN

## 2024-01-04 ENCOUNTER — Telehealth: Payer: Self-pay | Admitting: Neurology

## 2024-01-04 ENCOUNTER — Telehealth: Payer: Self-pay

## 2024-01-04 NOTE — Telephone Encounter (Signed)
 [facility] notifying have received referral for IVIG

## 2024-01-04 NOTE — Telephone Encounter (Signed)
 Fax to [facility] at [phone]

## 2024-01-04 NOTE — Telephone Encounter (Signed)
 noted

## 2024-01-04 NOTE — Telephone Encounter (Signed)
 HOME HEALTH ORDERS FAXED TO [facility]

## 2024-01-04 NOTE — Telephone Encounter (Signed)
 Can we try to home health IVIG 1g/kg every 2 weeks for him,   He was getting IVIG through our infrafusion, but is prohibited by high copay.

## 2024-01-05 ENCOUNTER — Ambulatory Visit (INDEPENDENT_AMBULATORY_CARE_PROVIDER_SITE_OTHER): Admitting: Neurology

## 2024-01-05 ENCOUNTER — Telehealth: Payer: Self-pay | Admitting: Neurology

## 2024-01-05 ENCOUNTER — Telehealth: Payer: Self-pay

## 2024-01-05 DIAGNOSIS — G6181 Chronic inflammatory demyelinating polyneuritis: Secondary | ICD-10-CM

## 2024-01-05 DIAGNOSIS — R269 Unspecified abnormalities of gait and mobility: Secondary | ICD-10-CM

## 2024-01-05 DIAGNOSIS — R000 Tachycardia, unspecified: Secondary | ICD-10-CM

## 2024-01-05 DIAGNOSIS — I10 Essential (primary) hypertension: Secondary | ICD-10-CM

## 2024-01-05 DIAGNOSIS — R202 Paresthesia of skin: Secondary | ICD-10-CM | POA: Diagnosis not present

## 2024-01-05 DIAGNOSIS — M792 Neuralgia and neuritis, unspecified: Secondary | ICD-10-CM | POA: Insufficient documentation

## 2024-01-05 MED ORDER — AMLODIPINE BESYLATE 5 MG PO TABS: 5.0000 mg | ORAL_TABLET | Freq: Every day | ORAL | 0 refills | Status: DC

## 2024-01-05 MED ORDER — METOPROLOL SUCCINATE ER 25 MG PO TB24: 25.0000 mg | ORAL_TABLET | Freq: Every day | ORAL | 0 refills | Status: DC

## 2024-01-05 MED ORDER — PREDNISONE 20 MG PO TABS
60.0000 mg | ORAL_TABLET | Freq: Every day | ORAL | 5 refills | Status: DC
Start: 2024-01-05 — End: 2024-01-19

## 2024-01-05 NOTE — Progress Notes (Addendum)
 ASSESSMENT AND PLAN  Jonathan Luna is a 58 y.o. 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  MRI of neural axis, including brain, cervical thoracic lumbar spine showed no significant abnormalities to explain above difficulties,  Mild improvement with IVIG treatment in Dec, 2024,   He had no significant improvement plasma exchange (Jan 2025)  Continue Out patient IVIG 2g/kg loading, followed by 1g/kg every 3 weeks Also on prednisone  60mg  daily since Jan 2025.  He had significant weakness, gait abnormality while receiving IVIG treatment, discussed with patient, we will proceed with rituximab  Neuropathic pain:  Taking gabapentin  300 mg 3 tablets 3 times a day, Cymbalta  60 mg daily, also on Percocet,      DIAGNOSTIC DATA (LABS, IMAGING, TESTING) - I reviewed patient records, labs, notes, testing and imaging myself where available.  Laboratory evaluations from January 28, 2024, hemoglobin of 15.1, MEDICAL HISTORY:  Jonathan Luna is a 58 year old male accompanied by his wife, seen in request by his primary care doctor Sandra Crouch for evaluation of rapid onset gait abnormality, initial evaluation September 22, 2023  History is obtained from the patient and review of electronic medical records. I personally reviewed pertinent available imaging films in PACS.   PMHx of  Depression, Anxiety Chronic low back pain, on percocet 5/325 tid by Dr. Rochelle Chu HTN Pneumonia Hereditary cataract,  Left retinal detachment,  Right blood vessel   He suffered upper respiratory symptoms, pneumonia, was treated with antibiotic in the middle of October 2024.  Azithromycin , subsequently Augmentin   Few days later, he began to noticed  numbness tingling starting at the bottom of his feet, toes, also burning sensation, by August 10, 2023, he noticed mild bilateral hands paresthesia, then had gradual onset gait abnormality, by November 11, when he walked to the car, he noticed unsteady gait,  His gait abnormality continue to getting worse, fell few times, lower extremity buckled underneath him, then around November 15, he began to have upper extremity symptoms, feel weak, shaky when holding object, shallow breathing, out of breath easily  She presented to emergency room August 28, 2023, heart rate documented 127, temperature of 101, blood pressure 157/100, was diagnosed with sepsis due to right upper lobe pneumonia, D-dimer was positive, PE protocol was negative, which CT chest showed posterior right upper lobe airspace and groundglass opacity, respiratory virus panel was negative  He was treated with IV ceftriaxone , azithromycin , followed by p.o. doxycycline , cefdinir , his upper respiratory symptoms has much improved, however, since discharge, he had persistent gait abnormality, weird achy pain involving upper and lower extremity, hypersensitivity of the neck running up and down his spine, twitching inside his body,  He drove himself, shop at [facility] on September 04, 2023, felt such gait abnormality, stiff weak lower extremity, shortness of breath, by the time he left [employer], he have to rely on a cart, to pull himself, see since then he relied on his wheelchair, has difficulty bearing weight  UPDATE September 30, 2023: He continues to have profound gait abnormality, no improvement  MRI of the brain  showed no significant abnormality  MRI of cervical and thoracic spine showed mild degenerative changes, no cord or nerve root compression, MRI lumbar spine showed prominent disc and facet degenerative changes, throughout, most prominent L4-5, moderate  right, mild foraminal narrowing  Extensive laboratory evaluations showed elevated TSH,  otherwise normal negative HIV, RPR, vitamin D , ANA A1c ESR C-reactive protein Lyme titer, vitamin D , protein electrophoresis, Lyme titer,  UPDATE Jan 16th 2025: He was admitted following last visit on September 30, 2023, received IVIG treatment, completed on October 04, 2023, reported mild improvement, was able to walk better, followed by inpatient rehabilitation, Lumbar puncture October 12, 2023, total protein of 139, WBC of 12, 84% lymphocyte, monocyte 14%  MRI of the brain December 19 was normal,  MRI of lumbar: Lower lumbar degenerative changes most noticeable L4-5, no canal stenosis moderate foraminal narrowing  MRI of thoracic spine only mild degenerative changes,  MRI of cervical spine, mild degenerative changes  But benefit from the IVIG from December only last for 3 weeks, over the past few weeks, he complains slow decline in functional status, now with profound bilateral upper and lower extremity weakness increased gait abnormality, shortness of breath with minimum exertion, was not able to continue his home PT,  Also complains of worsening neuropathic pain taking gabapentin  2400 mg daily,  Recent labs showed hyponatremia sodium 130, drink large quantity of free water at home  UPDATE December 14 2023: Patient was sent to hospital admission following last visit on October 29, 2023, underwent plasma exchange with no significant improvement in his weakness, gait abnormality  He was reloaded with IVIG on November 17, 2023, again on November 25, 2018  He only noticed mild improvement, his gait has improved some with bilateral AFO, physical therapy, ambulated with a walker  Laboratory evaluation from February 2024, TB QuantiFERON was negative, CMP showed mild decreased albumin  of 3.1, CBC showed hemoglobin of 13.8,  He also remained on prednisone  60 mg daily  UPDATE January 05 2024: He continues to have significant gait abnormality distal weakness, neuropathic pain, on polypharmacy  including gabapentin  900 mg 3 times a day, Cymbalta  60 mg daily Percocet 5/325 mg 3 times a day, clonazepam  0.5 mg as needed from his primary care  Able to ambulate with walker,  He is getting IVIG 1 g/kg every 3 weeks, there is a big financial concern with high deductible for his insurance, is looking for alternative such as home health IVIG  Rituximab prior authorization pending, if is approved, will add on current IVIG treatment  He is also on prednisone  60 mg daily   PHYSICAL EXAM:      12/08/2023    6:01 AM 12/07/2023    8:22 PM 12/07/2023    3:43 PM  Vitals with BMI  Systolic 133 110 161  Diastolic 85 77 83  Pulse 62 73 84     PHYSICAL EXAMNIATION:  Gen: NAD, conversant, well nourised, well groomed                     Cardiovascular: Regular rate rhythm, no peripheral edema, warm, nontender. Eyes: Conjunctivae clear without exudates or hemorrhage Neck: Supple, no carotid bruits. Pulmonary: Clear to auscultation bilaterally   NEUROLOGICAL EXAM:  MENTAL STATUS: Speech/cognition: Awake, alert, oriented to history taking and casual conversation CRANIAL NERVES: CN II: Visual fields are full to confrontation.  Postsurgical irregular pupil  CN III, IV, VI: extraocular movement are normal. No ptosis. CN V: Facial sensation is intact to light touch CN VII: Face is symmetric with normal eye closure  CN VIII: Hearing is normal to causal conversation. CN IX, X: Phonation is normal. CN XI: Head turning and shoulder shrug are intact  MOTOR: No  significant bilateral upper extremity proximal weakness, profound distal weakness, no antigravity movement below wrist  mild bilateral hip flexion weakness, moderate to severe bilateral ankle dorsiflexion, plantarflexion weakness,  REFLEXES: Areflexia  SENSORY: Well-preserved toe vibratory sensation, proprioception, pinprick,    COORDINATION: Moderate truncal ataxia, mild  bilateral finger-to-nose heel-to-shin dysmetria  GAIT/STANCE:  Need push-up to get up from seated position, rely on his walker, wide-based unsteady, ataxic gait REVIEW OF SYSTEMS:  Full 14 system review of systems performed and notable only for as above All other review of systems were negative.   ALLERGIES: Allergies  Allergen Reactions   Amoxil  [Amoxicillin ] Other (See Comments)    Pt states that it does not work for him when he had back to back pneumonia   Effexor [Venlafaxine] Diarrhea   Prozac  [Fluoxetine  Hcl] Diarrhea    HOME MEDICATIONS: Current Outpatient Medications  Medication Sig Dispense Refill   acetaminophen  (TYLENOL ) 325 MG tablet Take 1-2 tablets (325-650 mg total) by mouth every 4 (four) hours as needed for mild pain (pain score 1-3).     albuterol  (VENTOLIN  HFA) 108 (90 Base) MCG/ACT inhaler Inhale 1-2 puffs into the lungs every 6 (six) hours as needed for wheezing or shortness of breath. 1 each 0   amLODipine  (NORVASC ) 5 MG tablet Take 1 tablet (5 mg total) by mouth daily. 30 tablet 0   aspirin  EC 81 MG tablet Take 81 mg by mouth daily. Swallow whole.     Cholecalciferol  (VITAMIN D -3) 25 MCG (1000 UT) CAPS Take 1 capsule by mouth daily.     diphenhydrAMINE  (BENADRYL ) 25 mg capsule Take 1 capsule (25 mg total) by mouth every 6 (six) hours as needed for itching.     DULoxetine  (CYMBALTA ) 60 MG capsule Take 1 capsule (60 mg total) by mouth daily. 30 capsule 11   fluticasone  (FLONASE ) 50 MCG/ACT nasal spray Place 2 sprays into both nostrils daily. 48 g 1   gabapentin  (NEURONTIN ) 300 MG capsule Take 3 capsules (900 mg total) by mouth 3 (three) times daily. 270 capsule 11   guaiFENesin -dextromethorphan  (ROBITUSSIN DM) 100-10 MG/5ML syrup Take 5 mLs by mouth every 4 (four) hours as needed for cough.     metoprolol  succinate (TOPROL  XL) 25 MG 24 hr tablet Take 1 tablet (25 mg total) by mouth daily. 30 tablet 0   oxyCODONE -acetaminophen  (PERCOCET/ROXICET) 5-325 MG tablet Take 1 tablet by mouth every 8 (eight) hours as needed for severe  pain (pain score 7-10). 90 tablet 0   polyethylene glycol (MIRALAX  / GLYCOLAX ) 17 g packet Take 17 g by mouth daily as needed for mild constipation.     predniSONE  (DELTASONE ) 20 MG tablet Take 3 tablets (60 mg total) by mouth daily with breakfast. 90 tablet 0   sulfamethoxazole -trimethoprim  (BACTRIM  DS) 800-160 MG tablet Take 1 tablet by mouth daily. 30 tablet 0   UNITHROID  125 MCG tablet TAKE 1 TABLET BY MOUTH DAILY BEFORE BREAKFAST. 90 tablet 0   No current facility-administered medications for this visit.    PAST MEDICAL HISTORY: Past Medical History:  Diagnosis Date   Allergy     Anxiety    Arthritis    Cataract    Community acquired pneumonia 08/28/2023   Depression    Detached retina    Heart murmur    History of bilateral cataract extraction    Hyperlipidemia    Hypothyroidism    Low back pain    Panic attack    Panic attacks    Pneumonia of right upper lobe due to  infectious organism 08/18/2023    PAST SURGICAL HISTORY: Past Surgical History:  Procedure Laterality Date   CATARACT EXTRACTION     EYE SURGERY     IR FLUORO GUIDE CV LINE RIGHT  10/29/2023   IR US  GUIDE VASC ACCESS RIGHT  10/29/2023    FAMILY HISTORY: Family History  Problem Relation Age of Onset   Breast cancer Mother    Hypertension Father    Heart disease Father    Asthma Father    Emphysema Father        smoker for 45 years   Diabetes Brother    Hyperlipidemia Brother     SOCIAL HISTORY: Social History   Socioeconomic History   Marital status: Married    Spouse name: Not on file   Number of children: 0   Years of education: Not on file   Highest education level: Not on file  Occupational History   Occupation: Cumputer operator/security clearance [employer]  Tobacco Use   Smoking status: Never    Passive exposure: Past   Smokeless tobacco: Never   Tobacco comments:    As a teenager  Vaping Use   Vaping status: Never Used  Substance and Sexual Activity   Alcohol use: Not  Currently   Drug use: No   Sexual activity: Yes    Birth control/protection: Condom  Other Topics Concern   Not on file  Social History Narrative   Caffienated drinks-no   Seat belt use often-yes   Regular Exercise-yes   Smoke alarm in the home-yes   Firearms/guns in the home-no   History of physical abuse-no               Social Drivers of Health   Financial Resource Strain: Patient Declined (05/27/2023)   Overall Financial Resource Strain (CARDIA)    Difficulty of Paying Living Expenses: Patient declined  Food Insecurity: No Food Insecurity (10/29/2023)   Hunger Vital Sign    Worried About Running Out of Food in the Last Year: Never true    Ran Out of Food in the Last Year: Never true  Transportation Needs: No Transportation Needs (10/29/2023)   PRAPARE - Administrator, Civil Service (Medical): No    Lack of Transportation (Non-Medical): No  Physical Activity: Insufficiently Active (05/27/2023)   Exercise Vital Sign    Days of Exercise per Week: 4 days    Minutes of Exercise per Session: 30 min  Stress: Stress Concern Present (05/27/2023)   [employer] of Occupational Health - Occupational Stress Questionnaire    Feeling of Stress : Rather much  Social Connections: Unknown (05/27/2023)   Social Connection and Isolation Panel [NHANES]    Frequency of Communication with Friends and Family: Patient declined    Frequency of Social Gatherings with Friends and Family: Once a week    Attends Religious Services: More than 4 times per year    Active Member of [employer] or Organizations: Yes    Attends Banker Meetings: More than 4 times per year    Marital Status: Patient declined  Intimate Partner Violence: Not At Risk (10/29/2023)   Humiliation, Afraid, Rape, and Kick questionnaire    Fear of Current or Ex-Partner: No    Emotionally Abused: No    Physically Abused: No    Sexually Abused: No      Phebe Brasil, M.D. Ph.D.  [facility] [address], [address] [place], [place] [address] Ph: [phone] Fax: [phone]  CC:  Arcadio Knuckles, MD [address] [place],  [place] [address]  Arcadio Knuckles, MD

## 2024-01-05 NOTE — Telephone Encounter (Signed)
 Call to vital care, discussed and reviewed with Jonathan Luna patient dosing per Dr. Terrace Arabia. 1g/kg for 80g over 2 days weekly if insurance will approve. If not 80g over 2 days every 3 weeks. Jonathan Luna to send over order for signature once completed

## 2024-01-05 NOTE — Telephone Encounter (Addendum)
 Left knee pain, 8/10, lasting for few minutes,   Gabapentin 300mg  3 tid,  Percocet 10/325mg  tid Cymbalta 60mg  daily,  Clonazepam 0.5mg  bid,

## 2024-01-07 ENCOUNTER — Telehealth: Payer: Self-pay

## 2024-01-07 NOTE — Telephone Encounter (Signed)
 HOME HEALTH ORDERS FAXED TO [facility]

## 2024-01-11 ENCOUNTER — Telehealth: Payer: Self-pay

## 2024-01-11 NOTE — Telephone Encounter (Signed)
 New  HOME HEALTH ORDERS FAXED TO [facility]

## 2024-01-11 NOTE — Telephone Encounter (Signed)
 Call to  [phone] to schedule peer to peer, case #[id number], spoke with [place]. She stated that today was last day and no appointment were available, I advised we were just notified Thursday afternoon. I asked for phone call from medial director prior to decision being made for weekly IVIG infusions, Dr. Terrace Arabia number provided.

## 2024-01-14 ENCOUNTER — Telehealth: Payer: Self-pay

## 2024-01-14 NOTE — Telephone Encounter (Signed)
 Mychart message sent.

## 2024-01-14 NOTE — Telephone Encounter (Signed)
 Marland Kitchen

## 2024-01-19 ENCOUNTER — Ambulatory Visit: Admitting: Internal Medicine

## 2024-01-19 ENCOUNTER — Encounter: Payer: Self-pay | Admitting: Internal Medicine

## 2024-01-19 VITALS — BP 132/86 | HR 85 | Temp 98.1°F | Resp 16 | Ht 67.0 in | Wt 192.8 lb

## 2024-01-19 DIAGNOSIS — E781 Pure hyperglyceridemia: Secondary | ICD-10-CM

## 2024-01-19 DIAGNOSIS — F331 Major depressive disorder, recurrent, moderate: Secondary | ICD-10-CM

## 2024-01-19 DIAGNOSIS — G6181 Chronic inflammatory demyelinating polyneuritis: Secondary | ICD-10-CM

## 2024-01-19 DIAGNOSIS — Z0001 Encounter for general adult medical examination with abnormal findings: Secondary | ICD-10-CM

## 2024-01-19 DIAGNOSIS — Z23 Encounter for immunization: Secondary | ICD-10-CM | POA: Diagnosis not present

## 2024-01-19 DIAGNOSIS — H00025 Hordeolum internum left lower eyelid: Secondary | ICD-10-CM

## 2024-01-19 DIAGNOSIS — F410 Panic disorder [episodic paroxysmal anxiety] without agoraphobia: Secondary | ICD-10-CM

## 2024-01-19 DIAGNOSIS — I10 Essential (primary) hypertension: Secondary | ICD-10-CM

## 2024-01-19 DIAGNOSIS — E785 Hyperlipidemia, unspecified: Secondary | ICD-10-CM

## 2024-01-19 DIAGNOSIS — E039 Hypothyroidism, unspecified: Secondary | ICD-10-CM | POA: Diagnosis not present

## 2024-01-19 DIAGNOSIS — H00015 Hordeolum externum left lower eyelid: Secondary | ICD-10-CM | POA: Insufficient documentation

## 2024-01-19 LAB — CBC WITH DIFFERENTIAL/PLATELET
Basophils Absolute: 0.1 10*3/uL (ref 0.0–0.1)
Basophils Relative: 0.5 % (ref 0.0–3.0)
Eosinophils Absolute: 0.1 10*3/uL (ref 0.0–0.7)
Eosinophils Relative: 0.9 % (ref 0.0–5.0)
HCT: 42 % (ref 39.0–52.0)
Hemoglobin: 14.2 g/dL (ref 13.0–17.0)
Lymphocytes Relative: 12.6 % (ref 12.0–46.0)
Lymphs Abs: 2 10*3/uL (ref 0.7–4.0)
MCHC: 33.9 g/dL (ref 30.0–36.0)
MCV: 90.9 fl (ref 78.0–100.0)
Monocytes Absolute: 0.4 10*3/uL (ref 0.1–1.0)
Monocytes Relative: 2.5 % — ABNORMAL LOW (ref 3.0–12.0)
Neutro Abs: 13 10*3/uL — ABNORMAL HIGH (ref 1.4–7.7)
Neutrophils Relative %: 83.5 % — ABNORMAL HIGH (ref 43.0–77.0)
Platelets: 480 10*3/uL — ABNORMAL HIGH (ref 150.0–400.0)
RBC: 4.62 Mil/uL (ref 4.22–5.81)
RDW: 14.1 % (ref 11.5–15.5)
WBC: 15.6 10*3/uL — ABNORMAL HIGH (ref 4.0–10.5)

## 2024-01-19 LAB — HEPATIC FUNCTION PANEL
ALT: 36 U/L (ref 0–53)
AST: 25 U/L (ref 0–37)
Albumin: 4.1 g/dL (ref 3.5–5.2)
Alkaline Phosphatase: 45 U/L (ref 39–117)
Bilirubin, Direct: 0.1 mg/dL (ref 0.0–0.3)
Total Bilirubin: 0.4 mg/dL (ref 0.2–1.2)
Total Protein: 7.2 g/dL (ref 6.0–8.3)

## 2024-01-19 LAB — TSH: TSH: 6.31 u[IU]/mL — ABNORMAL HIGH (ref 0.35–5.50)

## 2024-01-19 LAB — PSA: PSA: 1.51 ng/mL (ref 0.10–4.00)

## 2024-01-19 MED ORDER — DULOXETINE HCL 30 MG PO CPEP: 30.0000 mg | ORAL_CAPSULE | Freq: Every day | ORAL | 0 refills | Status: DC

## 2024-01-19 MED ORDER — SULFAMETHOXAZOLE-TRIMETHOPRIM 800-160 MG PO TABS: 1.0000 | ORAL_TABLET | Freq: Two times a day (BID) | ORAL | 0 refills | Status: AC

## 2024-01-19 MED ORDER — UNITHROID 137 MCG PO TABS: 137.0000 ug | ORAL_TABLET | Freq: Every day | ORAL | 1 refills | Status: DC

## 2024-01-19 MED ORDER — OMEGA-3-ACID ETHYL ESTERS 1 G PO CAPS: 2.0000 g | ORAL_CAPSULE | Freq: Two times a day (BID) | ORAL | 1 refills | Status: DC

## 2024-01-19 NOTE — Patient Instructions (Signed)
 Health Maintenance, Male  Adopting a healthy lifestyle and getting preventive care are important in promoting health and wellness. Ask your health care provider about:  The right schedule for you to have regular tests and exams.  Things you can do on your own to prevent diseases and keep yourself healthy.  What should I know about diet, weight, and exercise?  Eat a healthy diet    Eat a diet that includes plenty of vegetables, fruits, low-fat dairy products, and lean protein.  Do not eat a lot of foods that are high in solid fats, added sugars, or sodium.  Maintain a healthy weight  Body mass index (BMI) is a measurement that can be used to identify possible weight problems. It estimates body fat based on height and weight. Your health care provider can help determine your BMI and help you achieve or maintain a healthy weight.  Get regular exercise  Get regular exercise. This is one of the most important things you can do for your health. Most adults should:  Exercise for at least 150 minutes each week. The exercise should increase your heart rate and make you sweat (moderate-intensity exercise).  Do strengthening exercises at least twice a week. This is in addition to the moderate-intensity exercise.  Spend less time sitting. Even light physical activity can be beneficial.  Watch cholesterol and blood lipids  Have your blood tested for lipids and cholesterol at 58 years of age, then have this test every 5 years.  You may need to have your cholesterol levels checked more often if:  Your lipid or cholesterol levels are high.  You are older than 58 years of age.  You are at high risk for heart disease.  What should I know about cancer screening?  Many types of cancers can be detected early and may often be prevented. Depending on your health history and family history, you may need to have cancer screening at various ages. This may include screening for:  Colorectal cancer.  Prostate cancer.  Skin cancer.  Lung  cancer.  What should I know about heart disease, diabetes, and high blood pressure?  Blood pressure and heart disease  High blood pressure causes heart disease and increases the risk of stroke. This is more likely to develop in people who have high blood pressure readings or are overweight.  Talk with your health care provider about your target blood pressure readings.  Have your blood pressure checked:  Every 3-5 years if you are 9-95 years of age.  Every year if you are 85 years old or older.  If you are between the ages of 29 and 29 and are a current or former smoker, ask your health care provider if you should have a one-time screening for abdominal aortic aneurysm (AAA).  Diabetes  Have regular diabetes screenings. This checks your fasting blood sugar level. Have the screening done:  Once every three years after age 23 if you are at a normal weight and have a low risk for diabetes.  More often and at a younger age if you are overweight or have a high risk for diabetes.  What should I know about preventing infection?  Hepatitis B  If you have a higher risk for hepatitis B, you should be screened for this virus. Talk with your health care provider to find out if you are at risk for hepatitis B infection.  Hepatitis C  Blood testing is recommended for:  Everyone born from [birth year] through 1965.  Anyone  with known risk factors for hepatitis C.  Sexually transmitted infections (STIs)  You should be screened each year for STIs, including gonorrhea and chlamydia, if:  You are sexually active and are younger than 58 years of age.  You are older than 58 years of age and your health care provider tells you that you are at risk for this type of infection.  Your sexual activity has changed since you were last screened, and you are at increased risk for chlamydia or gonorrhea. Ask your health care provider if you are at risk.  Ask your health care provider about whether you are at high risk for HIV. Your health care provider  may recommend a prescription medicine to help prevent HIV infection. If you choose to take medicine to prevent HIV, you should first get tested for HIV. You should then be tested every 3 months for as long as you are taking the medicine.  Follow these instructions at home:  Alcohol use  Do not drink alcohol if your health care provider tells you not to drink.  If you drink alcohol:  Limit how much you have to 0-2 drinks a day.  Know how much alcohol is in your drink. In the [place] one drink equals one 12 oz bottle of beer (355 mL), one 5 oz glass of wine (148 mL), or one 1 oz glass of hard liquor (44 mL).  Lifestyle  Do not use any products that contain nicotine or tobacco. These products include cigarettes, chewing tobacco, and vaping devices, such as e-cigarettes. If you need help quitting, ask your health care provider.  Do not use street drugs.  Do not share needles.  Ask your health care provider for help if you need support or information about quitting drugs.  General instructions  Schedule regular health, dental, and eye exams.  Stay current with your vaccines.  Tell your health care provider if:  You often feel depressed.  You have ever been abused or do not feel safe at home.  Summary  Adopting a healthy lifestyle and getting preventive care are important in promoting health and wellness.  Follow your health care provider's instructions about healthy diet, exercising, and getting tested or screened for diseases.  Follow your health care provider's instructions on monitoring your cholesterol and blood pressure.  This information is not intended to replace advice given to you by your health care provider. Make sure you discuss any questions you have with your health care provider.  Document Revised: 02/18/2021 Document Reviewed: 02/18/2021  Elsevier Patient Education  2024 [employer].

## 2024-01-19 NOTE — Progress Notes (Unsigned)
 Subjective:  Patient ID: Jonathan Luna, male    DOB: 01/07/1966  Age: 58 y.o. MRN: [id number]  CC: Annual Exam, Hypertension, Hyperlipidemia, and Depression   HPI Jonathan Luna presents for a CPX and f/up ----  Discussed the use of AI scribe software for clinical note transcription with the patient, who gave verbal consent to proceed.  History of Present Illness   Jonathan Luna is a 58 year old male with CIDP and hypertension who presents for medication management and follow-up.  He has Chronic Inflammatory Demyelinating Polyneuropathy (CIDP) with symptoms including absent reflexes, wrist drop, and inability to extend his wrists. He believes his condition is related to an overactive immune response, possibly exacerbated by a past pneumonia. A recent chest X-ray showed minor scar tissue in the upper right lung, but he has no current symptoms of pneumonia such as cough, wheezing, fever, or chills.  His hypertension is well-controlled with a blood pressure of 132/86 mmHg. He is on metoprolol and amlodipine and reports no symptoms such as headache, blurred vision, chest pain, or shortness of breath. No peripheral edema is noted.  He experiences depressive symptoms that are not well-managed with 60 mg of duloxetine. He recalls a previous increase to 100 mg in the hospital was beneficial. He denies any suicidal or homicidal ideation.  He suffers from significant insomnia, sleeping only two hours per night. He previously tried trazodone but experienced adverse effects such as headaches and malaise upon waking. He is not currently on any medication for insomnia.  He notes his bowel movements are light brown, which he initially thought was abnormal but now understands is normal. He is concerned about liver health due to information he read about degenerative liver disease. He takes omega-3 supplements, believing they aid in nerve regeneration and cholesterol management, and includes sardines in his diet.        Outpatient Medications Prior to Visit  Medication Sig Dispense Refill   amLODipine (NORVASC) 5 MG tablet Take 1 tablet (5 mg total) by mouth daily. 30 tablet 0   aspirin EC 81 MG tablet Take 81 mg by mouth daily. Swallow whole.     Cholecalciferol (VITAMIN D-3) 25 MCG (1000 UT) CAPS Take 1 capsule by mouth daily.     clonazePAM (KLONOPIN) 0.5 MG tablet Take 0.5 mg by mouth 2 (two) times daily as needed for anxiety.     DULoxetine (CYMBALTA) 60 MG capsule Take 1 capsule (60 mg total) by mouth daily. 30 capsule 11   fluticasone (FLONASE) 50 MCG/ACT nasal spray Place 2 sprays into both nostrils daily. 48 g 1   gabapentin (NEURONTIN) 300 MG capsule Take 3 capsules (900 mg total) by mouth 3 (three) times daily. 270 capsule 11   oxyCODONE-acetaminophen (PERCOCET/ROXICET) 5-325 MG tablet Take 1 tablet by mouth every 8 (eight) hours as needed for severe pain (pain score 7-10). 90 tablet 0   predniSONE (DELTASONE) 20 MG tablet Take 60 mg by mouth daily with breakfast. 3 tablets     metoprolol succinate (TOPROL XL) 25 MG 24 hr tablet Take 1 tablet (25 mg total) by mouth daily. 30 tablet 0   UNITHROID 125 MCG tablet TAKE 1 TABLET BY MOUTH DAILY BEFORE BREAKFAST. 90 tablet 0   acetaminophen (TYLENOL) 325 MG tablet Take 1-2 tablets (325-650 mg total) by mouth every 4 (four) hours as needed for mild pain (pain score 1-3).     albuterol (VENTOLIN HFA) 108 (90 Base) MCG/ACT inhaler Inhale 1-2 puffs into the lungs  every 6 (six) hours as needed for wheezing or shortness of breath. 1 each 0   diphenhydrAMINE (BENADRYL) 25 mg capsule Take 1 capsule (25 mg total) by mouth every 6 (six) hours as needed for itching.     guaiFENesin-dextromethorphan (ROBITUSSIN DM) 100-10 MG/5ML syrup Take 5 mLs by mouth every 4 (four) hours as needed for cough.     polyethylene glycol (MIRALAX / GLYCOLAX) 17 g packet Take 17 g by mouth daily as needed for mild constipation.     predniSONE (DELTASONE) 20 MG tablet Take 3 tablets (60  mg total) by mouth daily with breakfast. 90 tablet 5   sulfamethoxazole-trimethoprim (BACTRIM DS) 800-160 MG tablet Take 1 tablet by mouth daily. 30 tablet 0   No facility-administered medications prior to visit.    ROS Review of Systems  Constitutional:  Positive for unexpected weight change (wt gain). Negative for appetite change, chills, diaphoresis and fatigue.  HENT: Negative.    Eyes:  Positive for pain. Negative for photophobia and visual disturbance.  Respiratory: Negative.  Negative for cough, shortness of breath and wheezing.   Cardiovascular:  Negative for chest pain, palpitations and leg swelling.  Gastrointestinal:  Negative for abdominal pain, constipation, diarrhea, nausea and vomiting.  Genitourinary: Negative.  Negative for difficulty urinating.  Musculoskeletal:  Positive for back pain and gait problem.  Skin: Negative.   Neurological:  Positive for weakness and numbness. Negative for dizziness, syncope, speech difficulty and light-headedness.  Hematological:  Negative for adenopathy. Does not bruise/bleed easily.  Psychiatric/Behavioral:  Positive for dysphoric mood and sleep disturbance. Negative for confusion and decreased concentration. The patient is nervous/anxious.     Objective:  BP 132/86 (BP Location: Left Arm, Patient Position: Sitting, Cuff Size: Normal)   Pulse 85   Temp 98.1 F (36.7 C) (Oral)   Resp 16   Ht 5\' 7"  (1.702 m)   Wt 192 lb 12.8 oz (87.5 kg)   SpO2 96%   BMI 30.20 kg/m   BP Readings from Last 3 Encounters:  01/19/24 132/86  12/08/23 133/85  11/10/23 131/87    Wt Readings from Last 3 Encounters:  01/19/24 192 lb 12.8 oz (87.5 kg)  11/19/23 181 lb 14.1 oz (82.5 kg)  11/07/23 179 lb 14.3 oz (81.6 kg)    Physical Exam Vitals reviewed.  Constitutional:      General: He is not in acute distress.    Appearance: He is ill-appearing (in a wheelchair). He is not toxic-appearing or diaphoretic.  HENT:     Nose: Nose normal.      Mouth/Throat:     Mouth: Mucous membranes are moist.  Eyes:     General: No scleral icterus.       Right eye: No foreign body, discharge or hordeolum.        Left eye: Hordeolum present.No foreign body or discharge.     Conjunctiva/sclera: Conjunctivae normal.  Cardiovascular:     Rate and Rhythm: Normal rate and regular rhythm.     Heart sounds: No murmur heard. Pulmonary:     Effort: Pulmonary effort is normal.     Breath sounds: No stridor. No wheezing, rhonchi or rales.  Abdominal:     General: Abdomen is flat.     Palpations: There is no mass.     Tenderness: There is no abdominal tenderness. There is no guarding.     Hernia: No hernia is present.  Musculoskeletal:        General: Normal range of motion.  Cervical back: Neck supple.     Right lower leg: No edema.     Left lower leg: No edema.  Lymphadenopathy:     Cervical: No cervical adenopathy.  Skin:    General: Skin is warm.     Findings: No rash.  Neurological:     Mental Status: He is alert. Mental status is at baseline.  Psychiatric:        Mood and Affect: Mood normal.     Lab Results  Component Value Date   WBC 15.6 (H) 01/19/2024   HGB 14.2 01/19/2024   HCT 42.0 01/19/2024   PLT 480.0 (H) 01/19/2024   GLUCOSE 81 12/07/2023   CHOL 175 10/08/2023   TRIG 177 (H) 10/08/2023   HDL 34 (L) 10/08/2023   LDLDIRECT 150.0 04/07/2022   LDLCALC 106 (H) 10/08/2023   ALT 36 01/19/2024   AST 25 01/19/2024   NA 136 12/07/2023   K 4.0 12/07/2023   CL 102 12/07/2023   CREATININE 0.79 12/07/2023   BUN 12 12/07/2023   CO2 26 12/07/2023   TSH 6.31 (H) 01/19/2024   PSA 1.51 01/19/2024   INR 1.0 08/29/2023   HGBA1C 5.3 09/22/2023    No results found.  Assessment & Plan:   Acquired hypothyroidism- His TSH is 6.3. Will increase his T4 dose. -     CBC with Differential/Platelet; Future -     TSH; Future -     Unithroid; Take 1 tablet (137 mcg total) by mouth daily before breakfast.  Dispense: 90 tablet;  Refill: 1 -     AMB Referral [facility]  Hyperlipidemia with target LDL less than 130 -     Hepatic function panel; Future  Moderate episode of recurrent major depressive disorder (HCC) -     DULoxetine HCl; Take 1 capsule (30 mg total) by mouth daily.  Dispense: 90 capsule; Refill: 0 -     AMB Referral [facility] Care Management  Panic anxiety syndrome -     DULoxetine HCl; Take 1 capsule (30 mg total) by mouth daily.  Dispense: 90 capsule; Refill: 0  CIDP (chronic inflammatory demyelinating polyneuropathy) (HCC) -     DULoxetine HCl; Take 1 capsule (30 mg total) by mouth daily.  Dispense: 90 capsule; Refill: 0  Essential hypertension- BP is well controlled. -     CBC with Differential/Platelet; Future -     TSH; Future  Hypertriglyceridemia, essential -     Omega-3-acid Ethyl Esters; Take 2 capsules (2 g total) by mouth 2 (two) times daily.  Dispense: 360 capsule; Refill: 1 -     Hepatic function panel; Future  Encounter for general adult medical examination with abnormal findings- Exam completed, labs reviewed, vaccines reviewed and updated, cancer screenings addressed, pt ed material was given.  -     PSA; Future  Immunization due -     Pneumococcal conjugate vaccine 20-valent  Hordeolum internum left lower eyelid -     Sulfamethoxazole-Trimethoprim; Take 1 tablet by mouth 2 (two) times daily for 7 days.  Dispense: 14 tablet; Refill: 0     Follow-up: Return in about 6 months (around 07/20/2024).  Sanda Linger, MD

## 2024-01-19 NOTE — Telephone Encounter (Signed)
 HOME HEALTH ORDERS FAXED TO [facility]

## 2024-01-20 ENCOUNTER — Other Ambulatory Visit: Payer: Self-pay

## 2024-01-20 ENCOUNTER — Other Ambulatory Visit: Payer: Self-pay | Admitting: Neurology

## 2024-01-20 ENCOUNTER — Encounter: Payer: Self-pay | Admitting: Internal Medicine

## 2024-01-20 DIAGNOSIS — I10 Essential (primary) hypertension: Secondary | ICD-10-CM

## 2024-01-20 DIAGNOSIS — R000 Tachycardia, unspecified: Secondary | ICD-10-CM

## 2024-01-21 ENCOUNTER — Telehealth: Payer: Self-pay | Admitting: Neurology

## 2024-01-21 ENCOUNTER — Telehealth: Payer: Self-pay

## 2024-01-21 NOTE — Telephone Encounter (Signed)
 Called [facility] and spoke to adam, who stated that they are requesting cbc considering platelets were high and request platelet check ahead of time. Is it ok for them to draw cbc? [facility] is wanting to know if the rituximab and ivg need to be spaced out or can they be done in the same week? Does he need to do the 2 weeks of rituximab 1st then do ivig. They are finding it hard to space them out and keep the pt on schedule and would like to make sure they are following the providers order   Scheduled currently  01/26/24 and 01/27/24 for ivig, 01/28/24 for the rituximab   They are wanting the cbc on the 3rd day just to keep an eye on the platelets.

## 2024-01-21 NOTE — Telephone Encounter (Signed)
 [facility]/Adam IVIG we supplying as octagam, Ruxience. We would like to add on a lab with infusion. Would like a call back.

## 2024-01-21 NOTE — Telephone Encounter (Signed)
  OK draw lab work and with schedule listed.  However if patient has side effect from IVIG, may consider longer break for the 1st dose of Rituximab.     Sequential Treatment Strategy: IVIg before rituximab: In some cases, IVIg may be administered to control acute symptoms or boost the immune system temporarily before starting rituximab therapy. This could help manage flare-ups or provide short-term relief while waiting for the full effect of rituximab, which can take several weeks.  Rituximab before IVIg: Rituximab can take a few weeks to months to show its full therapeutic effect. During this time, IVIg may be used to provide more immediate symptom relief if necessary, particularly if the patient is experiencing disease exacerbation

## 2024-01-22 NOTE — Telephone Encounter (Signed)
 Called and relayed the following msg:    OK draw lab work and with schedule listed.   However if patient has side effect from IVIG, may consider longer break for the 1st dose of Rituximab.   Jonathan Luna at [facility] voiced gratitude and understanding

## 2024-01-25 ENCOUNTER — Telehealth: Payer: Self-pay | Admitting: *Deleted

## 2024-01-25 NOTE — Progress Notes (Unsigned)
 Care Guide Pharmacy Note  01/25/2024 Name: [name] MRN: [id number] DOB: Aug 12, 1966  Referred By: Arcadio Knuckles, MD Reason for referral: Complex Care Management and Call Attempt #1 (Outreach to schedule referral with pharmacist )   Jonathan Luna is a 58 y.o. year old male who is a primary care patient of Arcadio Knuckles, MD.  [name] was referred to the pharmacist for assistance related to:  hypothyroidism   An unsuccessful telephone outreach was attempted today to contact the patient who was referred to the pharmacy team for assistance with medication management. Additional attempts will be made to contact the patient.  [name], CMA [place]  [facility], [facility] Guide Direct Dial: [phone]  Fax: [phone] Website: [email or web address]

## 2024-01-26 NOTE — Progress Notes (Unsigned)
 Care Guide Pharmacy Note  01/26/2024 Name: Jonathan Luna MRN: [id number] DOB: [birth year]  Referred By: Arcadio Knuckles, MD Reason for referral: Complex Care Management and Call Attempt #1 (Outreach to schedule referral with pharmacist )   Jonathan Luna is a 58 y.o. year old male who is a primary care patient of Arcadio Knuckles, MD.  [name] was referred to the pharmacist for assistance related to:  hypothyroidism   A second unsuccessful telephone outreach was attempted today to contact the patient who was referred to the pharmacy team for assistance with medication management. Additional attempts will be made to contact the patient.  [name], CMA [place]  [facility], [facility] Guide Direct Dial: [phone]  Fax: [phone] Website: [email or web address]

## 2024-01-27 NOTE — Progress Notes (Signed)
 Care Guide Pharmacy Note  01/27/2024 Name: Jonathan Luna MRN: [id number] DOB: 1966-03-20  Referred By: Arcadio Knuckles, MD Reason for referral: Complex Care Management and Call Attempt #1 (Outreach to schedule referral with pharmacist )   [name] is a 59 y.o. year old male who is a primary care patient of Arcadio Knuckles, MD.  [name] was referred to the pharmacist for assistance related to:  hypothyroidism   A third unsuccessful telephone outreach was attempted today to contact the patient who was referred to the pharmacy team for assistance with medication management. The Population Health team is pleased to engage with this patient at any time in the future upon receipt of referral and should he/she be interested in assistance from the Population Health team.  [name], CMA [facility]  [facility], [facility] Guide Direct Dial: [phone]  Fax: [phone] Website: [email or web address]

## 2024-01-29 ENCOUNTER — Other Ambulatory Visit: Payer: Self-pay | Admitting: Internal Medicine

## 2024-01-29 DIAGNOSIS — G894 Chronic pain syndrome: Secondary | ICD-10-CM

## 2024-01-29 DIAGNOSIS — M51369 Other intervertebral disc degeneration, lumbar region without mention of lumbar back pain or lower extremity pain: Secondary | ICD-10-CM

## 2024-01-29 DIAGNOSIS — M5030 Other cervical disc degeneration, unspecified cervical region: Secondary | ICD-10-CM

## 2024-01-29 DIAGNOSIS — G8929 Other chronic pain: Secondary | ICD-10-CM

## 2024-01-30 ENCOUNTER — Other Ambulatory Visit: Payer: Self-pay | Admitting: Neurology

## 2024-01-30 DIAGNOSIS — R000 Tachycardia, unspecified: Secondary | ICD-10-CM

## 2024-01-30 DIAGNOSIS — I10 Essential (primary) hypertension: Secondary | ICD-10-CM

## 2024-02-02 ENCOUNTER — Other Ambulatory Visit: Payer: Self-pay

## 2024-02-02 ENCOUNTER — Encounter: Payer: Self-pay | Admitting: Internal Medicine

## 2024-02-02 MED ORDER — OXYCODONE-ACETAMINOPHEN 5-325 MG PO TABS
1.0000 | ORAL_TABLET | Freq: Three times a day (TID) | ORAL | 0 refills | Status: DC | PRN
Start: 2024-02-02 — End: 2024-03-01

## 2024-02-02 MED ORDER — CLONAZEPAM 0.5 MG PO TABS
0.5000 mg | ORAL_TABLET | Freq: Two times a day (BID) | ORAL | 0 refills | Status: DC | PRN
  Filled 2024-02-02: qty 60, 30d supply, fill #0

## 2024-02-04 ENCOUNTER — Ambulatory Visit: Admitting: Neurology

## 2024-02-04 NOTE — Telephone Encounter (Signed)
 If it is just localized stye infection, it is ok for him to proceed with antibiotics and proceed with his Rituximab plan

## 2024-02-08 ENCOUNTER — Ambulatory Visit: Admitting: Neurology

## 2024-02-08 MED ORDER — TIZANIDINE HCL 2 MG PO CAPS: 2.0000 mg | ORAL_CAPSULE | Freq: Three times a day (TID) | ORAL | 5 refills | Status: AC | PRN

## 2024-02-08 NOTE — Telephone Encounter (Signed)
 Meds ordered this encounter  Medications   tizanidine  (ZANAFLEX ) 2 MG capsule    Sig: Take 1 capsule (2 mg total) by mouth 3 (three) times daily as needed for muscle spasms.    Dispense:  90 capsule    Refill:  5     Common side effect of Tizanidine , especially with polypharmacy Gabapentin , cymbalta , only take it as needed. Cardiovascular: Hypotension (16% to 33%) Gastrointestinal: Xerostomia (49% to 88%) Neurologic: Asthenia (41% to 78%), Dizziness (16% to 45%), Somnolence (48% to 92%) OK continue PT/OT, insurance along with evaluation of Home Health PT/OT will dictate the length and frequency of treatment

## 2024-02-08 NOTE — Telephone Encounter (Signed)
 Will see him at our clinic first, before consider refer.

## 2024-02-08 NOTE — Addendum Note (Signed)
 Addended by: Mariko Nowakowski on: 02/08/2024 11:31 AM   Modules accepted: Orders

## 2024-02-08 NOTE — Telephone Encounter (Signed)
 Please give him a follow up appt 1-2 weeks after 2 loading dose of Rituximab

## 2024-02-08 NOTE — Telephone Encounter (Signed)
 Call to [facility], spoke with Jonathan Luna. Patient is scheduled 5/2 for 2nd loading dose of rutiximab. Will need visit with Dr. Gracie Lav around 5/16.  Call to patient, in agreement for 5/15 3:30pm. He says he is having cramps and spasms in calves and sometimes in wrists. Advised I would  talk with Dr. Gracie Lav about.

## 2024-02-09 ENCOUNTER — Telehealth: Payer: Self-pay | Admitting: Neurology

## 2024-02-09 NOTE — Telephone Encounter (Signed)
 At 10:27 Marissa @[facility]  left a vm stating they discharged pt today and they are asking that Dr Gracie Lav sends a referral to [facility] for pt's OT & PT, their fax# is [phone]

## 2024-02-10 ENCOUNTER — Other Ambulatory Visit: Payer: Self-pay | Admitting: Internal Medicine

## 2024-02-10 DIAGNOSIS — H00025 Hordeolum internum left lower eyelid: Secondary | ICD-10-CM

## 2024-02-11 ENCOUNTER — Encounter: Payer: Self-pay | Admitting: Neurology

## 2024-02-11 DIAGNOSIS — R269 Unspecified abnormalities of gait and mobility: Secondary | ICD-10-CM

## 2024-02-11 DIAGNOSIS — G6181 Chronic inflammatory demyelinating polyneuritis: Secondary | ICD-10-CM

## 2024-02-11 NOTE — Telephone Encounter (Signed)
Orders Placed This Encounter  Procedures   Ambulatory referral to Physical Therapy   Ambulatory referral to Occupational Therapy    

## 2024-02-15 ENCOUNTER — Other Ambulatory Visit: Payer: Self-pay

## 2024-02-18 ENCOUNTER — Telehealth: Payer: Self-pay | Admitting: Neurology

## 2024-02-18 NOTE — Addendum Note (Signed)
 Addended by: Genora Kidd on: 02/18/2024 09:02 AM   Modules accepted: Orders

## 2024-02-18 NOTE — Telephone Encounter (Signed)
Referral for physical therapy fax to [facility].Phone: [phone], Fax: [phone]

## 2024-02-25 ENCOUNTER — Ambulatory Visit: Admitting: Neurology

## 2024-02-25 VITALS — BP 132/80 | HR 62 | Ht 67.0 in | Wt 192.9 lb

## 2024-02-25 DIAGNOSIS — G6181 Chronic inflammatory demyelinating polyneuritis: Secondary | ICD-10-CM | POA: Diagnosis not present

## 2024-02-25 DIAGNOSIS — R269 Unspecified abnormalities of gait and mobility: Secondary | ICD-10-CM

## 2024-02-25 DIAGNOSIS — M792 Neuralgia and neuritis, unspecified: Secondary | ICD-10-CM | POA: Insufficient documentation

## 2024-02-25 MED ORDER — GABAPENTIN 300 MG PO CAPS: 1200.0000 mg | ORAL_CAPSULE | Freq: Three times a day (TID) | ORAL | 11 refills | Status: DC

## 2024-02-25 MED ORDER — OXCARBAZEPINE 300 MG PO TABS: 300.0000 mg | ORAL_TABLET | Freq: Three times a day (TID) | ORAL | 11 refills | Status: AC

## 2024-02-25 NOTE — Progress Notes (Signed)
 ASSESSMENT AND PLAN  Jonathan Luna is a 58 y.o. 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  MRI of neural axis, including brain, cervical thoracic lumbar spine showed no significant abnormalities to explain above difficulties,  Mild improvement with IVIG treatment in Dec, 2024,   He had no significant improvement plasma exchange (Jan 2025) Also on prednisone  60mg  daily since Jan 2025.  He had significant distal weakness, gait abnormality while receiving IVIG treatment,   Start  Home infusion Rituximab in April 2025, and continue IVIG 1g/kg weekly  Repeat EMG/NCS  Neuropathic pain:  High does of gabapentin  300 mg 4 tablets 3 times a day, trileptal  300mg  tid prn, Cymbalta  60 mg daily, also on Percocet,      DIAGNOSTIC DATA (LABS, IMAGING, TESTING) - I reviewed patient records, labs, notes, testing and imaging myself where available.  Laboratory evaluations from January 28, 2024, hemoglobin of 15.1, MEDICAL HISTORY:  Jonathan Luna is a 58 year old male accompanied by his wife, seen in request by his primary care doctor Sandra Crouch for evaluation of rapid onset gait abnormality, initial evaluation September 22, 2023  History is obtained from the patient and review of electronic medical records. I personally reviewed pertinent available imaging films in PACS.   PMHx of  Depression, Anxiety Chronic low back pain, on percocet 5/325 tid by Dr. Rochelle Chu HTN Pneumonia Hereditary cataract,  Left retinal detachment,  Right blood vessel   He suffered upper respiratory symptoms, pneumonia, was treated with antibiotic in the middle of October 2024.  Azithromycin , subsequently Augmentin   Few days later, he began to noticed  numbness tingling starting at the bottom of his feet, toes, also burning sensation, by August 10, 2023, he noticed mild bilateral hands paresthesia, then had gradual onset gait abnormality, by November 11, when he walked to the car, he noticed unsteady gait,  His gait abnormality continue to getting worse, fell few times, lower extremity buckled underneath him, then around November 15, he began to have upper extremity symptoms, feel weak, shaky when holding object, shallow breathing, out of breath easily  She presented to emergency room August 28, 2023, heart rate documented 127, temperature of 101, blood pressure 157/100, was diagnosed with sepsis due to right upper lobe pneumonia, D-dimer was positive, PE protocol was negative, which CT chest showed posterior right upper lobe airspace and groundglass opacity, respiratory virus panel was negative  He was treated with IV ceftriaxone , azithromycin , followed by p.o. doxycycline , cefdinir , his upper respiratory symptoms has much improved, however, since discharge, he had persistent gait abnormality, weird achy pain involving upper and lower extremity, hypersensitivity of the neck running up and down his spine, twitching inside his body,  He drove himself, shop at [facility] on September 04, 2023, felt such gait abnormality, stiff weak lower extremity, shortness of breath, by the time he left [employer], he have to rely on a cart, to pull himself, see since then he relied on his wheelchair, has difficulty bearing weight  UPDATE September 30, 2023: He continues to have profound gait abnormality, no improvement  MRI of the brain  showed no significant abnormality  MRI of cervical and thoracic spine showed mild degenerative changes, no cord or nerve root compression, MRI lumbar spine showed prominent disc and facet degenerative changes, throughout,  most prominent L4-5, moderate right, mild foraminal narrowing  Extensive laboratory evaluations showed elevated TSH,  otherwise normal negative HIV, RPR, vitamin D , ANA A1c ESR C-reactive protein Lyme titer, vitamin D , protein electrophoresis, Lyme titer,  UPDATE Jan 16th 2025: He was admitted following last visit on September 30, 2023, received IVIG treatment, completed on October 04, 2023, reported mild improvement, was able to walk better, followed by inpatient rehabilitation, Lumbar puncture October 12, 2023, total protein of 139, WBC of 12, 84% lymphocyte, monocyte 14%  MRI of the brain December 19 was normal,  MRI of lumbar: Lower lumbar degenerative changes most noticeable L4-5, no canal stenosis moderate foraminal narrowing  MRI of thoracic spine only mild degenerative changes,  MRI of cervical spine, mild degenerative changes  But benefit from the IVIG from December only last for 3 weeks, over the past few weeks, he complains slow decline in functional status, now with profound bilateral upper and lower extremity weakness increased gait abnormality, shortness of breath with minimum exertion, was not able to continue his home PT,  Also complains of worsening neuropathic pain taking gabapentin  2400 mg daily,  Recent labs showed hyponatremia sodium 130, drink large quantity of free water at home  UPDATE December 14 2023: Patient was sent to hospital admission following last visit on October 29, 2023, underwent plasma exchange with no significant improvement in his weakness, gait abnormality  He was reloaded with IVIG on November 17, 2023, again on November 25, 2018  He only noticed mild improvement, his gait has improved some with bilateral AFO, physical therapy, ambulated with a walker  Laboratory evaluation from February 2024, TB QuantiFERON was negative, CMP showed mild decreased albumin  of 3.1, CBC showed hemoglobin of 13.8,  He also remained on prednisone  60 mg daily  UPDATE January 05 2024: He continues to have significant gait abnormality distal weakness, neuropathic pain, on polypharmacy  including gabapentin  900 mg 3 times a day, Cymbalta  60 mg daily Percocet 5/325 mg 3 times a day, clonazepam  0.5 mg as needed from his primary care  Able to ambulate with walker,  He is getting IVIG 1 g/kg every 3 weeks, there is a big financial concern with high deductible for his insurance, is looking for alternative such as home health IVIG  Rituximab prior authorization pending, if is approved, will add on current IVIG treatment  He is also on prednisone  60 mg daily  UPDATE May 15th 2025: Home infusion Rituximab 500mg  x2 2 weeks apart, finished at beginning of May 2025, tolerate it well, IVIG 1 g/kg every week (in 2 days), tolerating it well,  Prednisone  60mg  daily since Jan 2025.  Neuropathic pain: in hands and feet, as if going to explode, 6/10, gabapentin  900mg  tid, percocet 5/325 tid, tizanidine  2mg  tid, cymbalta  60mg  daily, trileptal  300mg  bid, pristiq  25mg  was started since May 2025, he has pain through out the day, difficulty sleeping.  He is improving, walked the whole [employer] pushing on shopping cart, feet is not as sensitive, still has profound distal arm and leg weakness   PHYSICAL EXAM:      02/25/2024    3:41 PM 01/19/2024    1:23 PM 12/08/2023    6:01 AM  Vitals with BMI  Height 5\' 7"  5\' 7"    Weight 192 lbs 14 oz 192 lbs 13 oz   BMI 30.21 30.19   Systolic 132 132 161  Diastolic 80 86 85  Pulse 62 85 62     PHYSICAL EXAMNIATION:  Gen: NAD, conversant, well  nourised, well groomed                     Cardiovascular: Regular rate rhythm, no peripheral edema, warm, nontender. Eyes: Conjunctivae clear without exudates or hemorrhage Neck: Supple, no carotid bruits. Pulmonary: Clear to auscultation bilaterally   NEUROLOGICAL EXAM:  MENTAL STATUS: Speech/cognition: Awake, alert, oriented to history taking and casual conversation CRANIAL NERVES: CN II: Visual fields are full to confrontation.  Postsurgical irregular pupil  CN III, IV, VI: extraocular movement are  normal. No ptosis. CN V: Facial sensation is intact to light touch CN VII: Face is symmetric with normal eye closure  CN VIII: Hearing is normal to causal conversation. CN IX, X: Phonation is normal. CN XI: Head turning and shoulder shrug are intact  MOTOR: No significant bilateral upper extremity proximal weakness, profound distal weakness, no antigravity movement below wrist  mild bilateral hip flexion weakness, moderate to severe bilateral ankle dorsiflexion, plantarflexion weakness,  REFLEXES: Areflexia  SENSORY: Well-preserved toe vibratory sensation, proprioception, pinprick,    COORDINATION: Moderate truncal ataxia, mild  bilateral finger-to-nose heel-to-shin dysmetria  GAIT/STANCE: Need push-up to get up from seated position, rely on his walker, wide-based unsteady, ataxic gait REVIEW OF SYSTEMS:  Full 14 system review of systems performed and notable only for as above All other review of systems were negative.   ALLERGIES: Allergies  Allergen Reactions   Amoxil  [Amoxicillin ] Other (See Comments)    Pt states that it does not work for him when he had back to back pneumonia   Effexor [Venlafaxine] Diarrhea   Prozac  [Fluoxetine  Hcl] Diarrhea    HOME MEDICATIONS: Current Outpatient Medications  Medication Sig Dispense Refill   aspirin  EC 81 MG tablet Take 81 mg by mouth daily. Swallow whole.     Cholecalciferol  (VITAMIN D -3) 25 MCG (1000 UT) CAPS Take 1 capsule by mouth daily.     desvenlafaxine  (PRISTIQ ) 25 MG 24 hr tablet Take 25 mg by mouth daily.     DULoxetine  (CYMBALTA ) 30 MG capsule Take 1 capsule (30 mg total) by mouth daily. 90 capsule 0   DULoxetine  (CYMBALTA ) 60 MG capsule Take 1 capsule (60 mg total) by mouth daily. 30 capsule 11   fluticasone  (FLONASE ) 50 MCG/ACT nasal spray Place 2 sprays into both nostrils daily. 48 g 1   gabapentin  (NEURONTIN ) 300 MG capsule Take 3 capsules (900 mg total) by mouth 3 (three) times daily. 270 capsule 11   metoprolol   succinate (TOPROL -XL) 25 MG 24 hr tablet Take 1 tablet (25 mg total) by mouth daily 30 tablet 0   omega-3 acid ethyl esters (LOVAZA ) 1 g capsule Take 2 capsules (2 g total) by mouth 2 (two) times daily. 360 capsule 1   Oxcarbazepine  (TRILEPTAL ) 300 MG tablet Take 300 mg by mouth 2 (two) times daily.     oxyCODONE -acetaminophen  (PERCOCET/ROXICET) 5-325 MG tablet Take 1 tablet by mouth every 8 (eight) hours as needed for severe pain (pain score 7-10). 90 tablet 0   predniSONE  (DELTASONE ) 10 MG tablet Take 20 mg by mouth in the morning, at noon, and at bedtime.     tizanidine  (ZANAFLEX ) 2 MG capsule Take 1 capsule (2 mg total) by mouth 3 (three) times daily as needed for muscle spasms. 90 capsule 5   UNITHROID  137 MCG tablet Take 1 tablet (137 mcg total) by mouth daily before breakfast. 90 tablet 1   No current facility-administered medications for this visit.    PAST MEDICAL HISTORY: Past Medical History:  Diagnosis  Date   Allergy     Anxiety    Arthritis    Cataract    Community acquired pneumonia 08/28/2023   Depression    Detached retina    Heart murmur    History of bilateral cataract extraction    Hyperlipidemia    Hypothyroidism    Low back pain    Panic attack    Panic attacks    Pneumonia of right upper lobe due to infectious organism 08/18/2023    PAST SURGICAL HISTORY: Past Surgical History:  Procedure Laterality Date   CATARACT EXTRACTION     EYE SURGERY     IR FLUORO GUIDE CV LINE RIGHT  10/29/2023   IR US  GUIDE VASC ACCESS RIGHT  10/29/2023    FAMILY HISTORY: Family History  Problem Relation Age of Onset   Breast cancer Mother    Hypertension Father    Heart disease Father    Asthma Father    Emphysema Father        smoker for 45 years   Diabetes Brother    Hyperlipidemia Brother     SOCIAL HISTORY: Social History   Socioeconomic History   Marital status: Married    Spouse name: Not on file   Number of children: 0   Years of education: Not on file    Highest education level: Not on file  Occupational History   Occupation: Cumputer operator/security clearance [employer]  Tobacco Use   Smoking status: Never    Passive exposure: Past   Smokeless tobacco: Never   Tobacco comments:    As a teenager  Vaping Use   Vaping status: Never Used  Substance and Sexual Activity   Alcohol use: Not Currently   Drug use: No   Sexual activity: Yes    Birth control/protection: Condom  Other Topics Concern   Not on file  Social History Narrative   Caffienated drinks-no   Seat belt use often-yes   Regular Exercise-yes   Smoke alarm in the home-yes   Firearms/guns in the home-no   History of physical abuse-no               Social Drivers of Health   Financial Resource Strain: Patient Declined (05/27/2023)   Overall Financial Resource Strain (CARDIA)    Difficulty of Paying Living Expenses: Patient declined  Food Insecurity: No Food Insecurity (10/29/2023)   Hunger Vital Sign    Worried About Running Out of Food in the Last Year: Never true    Ran Out of Food in the Last Year: Never true  Transportation Needs: No Transportation Needs (10/29/2023)   PRAPARE - Administrator, Civil Service (Medical): No    Lack of Transportation (Non-Medical): No  Physical Activity: Insufficiently Active (05/27/2023)   Exercise Vital Sign    Days of Exercise per Week: 4 days    Minutes of Exercise per Session: 30 min  Stress: Stress Concern Present (05/27/2023)   [employer] of Occupational Health - Occupational Stress Questionnaire    Feeling of Stress : Rather much  Social Connections: Unknown (05/27/2023)   Social Connection and Isolation Panel [NHANES]    Frequency of Communication with Friends and Family: Patient declined    Frequency of Social Gatherings with Friends and Family: Once a week    Attends Religious Services: More than 4 times per year    Active Member of [employer] or Organizations: Yes    Attends Tax inspector Meetings: More than 4 times per year  Marital Status: Patient declined  Intimate Partner Violence: Not At Risk (10/29/2023)   Humiliation, Afraid, Rape, and Kick questionnaire    Fear of Current or Ex-Partner: No    Emotionally Abused: No    Physically Abused: No    Sexually Abused: No      Phebe Brasil, M.D. Ph.D.  [facility] [address] [place], [place] [address] Ph: [phone] Fax: [phone]  CC:  Arcadio Knuckles, MD [address] [place],  [place] [address]  Arcadio Knuckles, MD

## 2024-02-26 LAB — URINALYSIS, ROUTINE W REFLEX MICROSCOPIC
Bilirubin, UA: NEGATIVE
Glucose, UA: NEGATIVE
Ketones, UA: NEGATIVE
Leukocytes,UA: NEGATIVE
Nitrite, UA: NEGATIVE
Protein,UA: NEGATIVE
RBC, UA: NEGATIVE
Specific Gravity, UA: 1.018 (ref 1.005–1.030)
Urobilinogen, Ur: 0.2 mg/dL (ref 0.2–1.0)
pH, UA: 6.5 (ref 5.0–7.5)

## 2024-02-27 LAB — URINE CULTURE

## 2024-02-29 ENCOUNTER — Ambulatory Visit: Payer: Self-pay | Admitting: Neurology

## 2024-03-01 ENCOUNTER — Other Ambulatory Visit: Payer: Self-pay | Admitting: Internal Medicine

## 2024-03-01 DIAGNOSIS — M545 Low back pain, unspecified: Secondary | ICD-10-CM

## 2024-03-01 DIAGNOSIS — M5030 Other cervical disc degeneration, unspecified cervical region: Secondary | ICD-10-CM

## 2024-03-01 DIAGNOSIS — M51369 Other intervertebral disc degeneration, lumbar region without mention of lumbar back pain or lower extremity pain: Secondary | ICD-10-CM

## 2024-03-01 DIAGNOSIS — G894 Chronic pain syndrome: Secondary | ICD-10-CM

## 2024-03-02 NOTE — Telephone Encounter (Signed)
 Received fax message on 03/01/24: We  have received your referral. Unfortunately we have been unable to reach them to schedule an appointment. We have sent a copy of this notice to, the patient asking that they contact ust at [phone] to schedule.  LVM patient to call the office in regards to referral.

## 2024-03-02 NOTE — Telephone Encounter (Signed)
 Pt returned phone;  OT therapist speaking with director of [facility]; OT therapist advised patient due to being a fall risk recommended not to do physical therapy. Pt stated "will call [facility] to see what is going on. If they will do physical therapy I will schedule the appointment."

## 2024-03-03 ENCOUNTER — Other Ambulatory Visit: Payer: Self-pay | Admitting: Neurology

## 2024-03-03 NOTE — Telephone Encounter (Signed)
 Last seen on 02/25/23 Follow up scheduled on 04/13/24 for nerve conduction   Dr.Yan   I have Rx pending I didn't see that medication should be continued and it not listed on medication list.

## 2024-03-14 ENCOUNTER — Ambulatory Visit: Admitting: Physical Therapy

## 2024-03-14 NOTE — Therapy (Incomplete)
 OUTPATIENT PHYSICAL THERAPY NEURO EVALUATION   Patient Name: Jonathan Luna MRN: [id number] DOB:March 23, 1966, 58 y.o., male Today's Date: 03/14/2024   PCP: *** REFERRING PROVIDER: Phebe Brasil, MD  END OF SESSION:   Past Medical History:  Diagnosis Date   Allergy     Anxiety    Arthritis    Cataract    Community acquired pneumonia 08/28/2023   Depression    Detached retina    Heart murmur    History of bilateral cataract extraction    Hyperlipidemia    Hypothyroidism    Low back pain    Panic attack    Panic attacks    Pneumonia of right upper lobe due to infectious organism 08/18/2023   Past Surgical History:  Procedure Laterality Date   CATARACT EXTRACTION     EYE SURGERY     IR FLUORO GUIDE CV LINE RIGHT  10/29/2023   IR US  GUIDE VASC ACCESS RIGHT  10/29/2023   Patient Active Problem List   Diagnosis Date Noted   Gait abnormality 02/25/2024   Neuropathic pain 02/25/2024   Moderate episode of recurrent major depressive disorder (HCC) 01/19/2024   Hypertriglyceridemia, essential 01/19/2024   Immunization due 01/19/2024   Hordeolum externum of left lower eyelid 01/19/2024   Hordeolum internum left lower eyelid 01/19/2024   CIDP (chronic inflammatory demyelinating polyneuropathy) (HCC) 10/29/2023   Vitamin D  deficiency disease 09/08/2018   Essential hypertension 09/07/2018   Long-term current use of opiate analgesic 04/07/2018   Encounter for general adult medical examination with abnormal findings 08/03/2017   Allergic rhinitis 12/13/2012   Chronic pain disorder 09/29/2012   Irritable bowel syndrome 10/16/2010   DDD (degenerative disc disease), lumbar 04/09/2010   Hypothyroidism 05/03/2009   Hyperlipidemia with target LDL less than 130 05/03/2009   Panic anxiety syndrome 05/03/2009    ONSET DATE: 02/18/2024 (referral date)  REFERRING DIAG: R26.9 (ICD-10-CM) - Gait abnormality  THERAPY DIAG:  No diagnosis found.  Rationale for Evaluation and Treatment:  Rehabilitation  SUBJECTIVE:                                                                                                                                                                                             SUBJECTIVE STATEMENT: ***  Pt accompanied by: {accompnied:27141}  PERTINENT HISTORY: ***  PAIN:  Are you having pain? {OPRCPAIN:27236}  PRECAUTIONS: {Therapy precautions:24002}  RED FLAGS: {PT Red Flags:29287}   WEIGHT BEARING RESTRICTIONS: {Yes ***/No:24003}  FALLS: Has patient fallen in last 6 months? {fallsyesno:27318}  LIVING ENVIRONMENT: Lives with: {OPRC lives with:25569::"lives with their family"} Lives in: {Lives in:25570} Stairs: {opstairs:27293} Has following equipment at home: {Assistive  devices:23999}  PLOF: {PLOF:24004}  PATIENT GOALS: ***  OBJECTIVE:  Note: Objective measures were completed at Evaluation unless otherwise noted.  DIAGNOSTIC FINDINGS: ***  COGNITION: Overall cognitive status: {cognition:24006}   SENSATION: {sensation:27233}  COORDINATION: ***  EDEMA:  {edema:24020}  MUSCLE TONE: {LE tone:25568}  MUSCLE LENGTH: Hamstrings: Right *** deg; Left *** deg Andy Bannister test: Right *** deg; Left *** deg  DTRs:  {DTR SITE:24025}  POSTURE: {posture:25561}  LOWER EXTREMITY ROM:     {AROM/PROM:27142}  Right Eval Left Eval  Hip flexion    Hip extension    Hip abduction    Hip adduction    Hip internal rotation    Hip external rotation    Knee flexion    Knee extension    Ankle dorsiflexion    Ankle plantarflexion    Ankle inversion    Ankle eversion     (Blank rows = not tested)  LOWER EXTREMITY MMT:    MMT Right Eval Left Eval  Hip flexion    Hip extension    Hip abduction    Hip adduction    Hip internal rotation    Hip external rotation    Knee flexion    Knee extension    Ankle dorsiflexion    Ankle plantarflexion    Ankle inversion    Ankle eversion    (Blank rows = not tested)  BED MOBILITY:   {bed mobility:32615:p}  TRANSFERS: {transfers eval:32620}  RAMP:  {ramp eval:32616}  CURB:  {curb eval:32617}  STAIRS: {stairs eval:32618} GAIT: Findings: {GaitneuroPT:32644::"Distance walked: ***","Comments: ***"}  FUNCTIONAL TESTS:  {Functional tests:24029}  PATIENT SURVEYS:  {rehab surveys:24030}                                                                                                                              TREATMENT DATE: ***    PATIENT EDUCATION: Education details: *** Person educated: {Person educated:25204} Education method: {Education Method:25205} Education comprehension: {Education Comprehension:25206}  HOME EXERCISE PROGRAM: ***  GOALS: Goals reviewed with patient? {yes/no:20286}  SHORT TERM GOALS: Target date: ***  *** Baseline: Goal status: INITIAL  2.  *** Baseline:  Goal status: INITIAL  3.  *** Baseline:  Goal status: INITIAL  4.  *** Baseline:  Goal status: INITIAL  5.  *** Baseline:  Goal status: INITIAL  6.  *** Baseline:  Goal status: INITIAL  LONG TERM GOALS: Target date: ***  *** Baseline:  Goal status: INITIAL  2.  *** Baseline:  Goal status: INITIAL  3.  *** Baseline:  Goal status: INITIAL  4.  *** Baseline:  Goal status: INITIAL  5.  *** Baseline:  Goal status: INITIAL  6.  *** Baseline:  Goal status: INITIAL  ASSESSMENT:  CLINICAL IMPRESSION: Patient is a *** year old *** referred to Neuro OPPT for***.   Pt's PMH is significant for: *** The following deficits were present during the exam: ***. Based on ***, pt is an incr risk for falls.  Pt would benefit from skilled PT to address these impairments and functional limitations to maximize functional mobility independence.   OBJECTIVE IMPAIRMENTS: {opptimpairments:25111}.   ACTIVITY LIMITATIONS: {activitylimitations:27494}  PARTICIPATION LIMITATIONS: {participationrestrictions:25113}  PERSONAL FACTORS: {Personal factors:25162} are  also affecting patient's functional outcome.   REHAB POTENTIAL: {rehabpotential:25112}  CLINICAL DECISION MAKING: {clinical decision making:25114}  EVALUATION COMPLEXITY: {Evaluation complexity:25115}  PLAN:  PT FREQUENCY: {rehab frequency:25116}  PT DURATION: {rehab duration:25117}  PLANNED INTERVENTIONS: {rehab planned interventions:25118::"97110-Therapeutic exercises","97530- Therapeutic [phone]- Neuromuscular re-education","97535- Self JXBJ","47829- Manual therapy"}  PLAN FOR NEXT SESSION: ***   Lorita Rosa, PT Lorita Rosa, PT, DPT, CSRS  03/14/2024, 8:07 AM

## 2024-03-26 ENCOUNTER — Other Ambulatory Visit: Payer: Self-pay | Admitting: Internal Medicine

## 2024-03-26 DIAGNOSIS — E039 Hypothyroidism, unspecified: Secondary | ICD-10-CM

## 2024-03-29 ENCOUNTER — Other Ambulatory Visit: Payer: Self-pay | Admitting: Family

## 2024-03-29 ENCOUNTER — Other Ambulatory Visit: Payer: Self-pay | Admitting: Neurology

## 2024-03-29 DIAGNOSIS — G894 Chronic pain syndrome: Secondary | ICD-10-CM

## 2024-03-29 DIAGNOSIS — M545 Low back pain, unspecified: Secondary | ICD-10-CM

## 2024-03-29 DIAGNOSIS — M5030 Other cervical disc degeneration, unspecified cervical region: Secondary | ICD-10-CM

## 2024-03-29 DIAGNOSIS — M51369 Other intervertebral disc degeneration, lumbar region without mention of lumbar back pain or lower extremity pain: Secondary | ICD-10-CM

## 2024-03-31 DIAGNOSIS — M25642 Stiffness of left hand, not elsewhere classified: Secondary | ICD-10-CM | POA: Insufficient documentation

## 2024-04-04 ENCOUNTER — Other Ambulatory Visit: Payer: Self-pay | Admitting: Internal Medicine

## 2024-04-04 ENCOUNTER — Encounter: Payer: Self-pay | Admitting: Internal Medicine

## 2024-04-04 DIAGNOSIS — G8929 Other chronic pain: Secondary | ICD-10-CM

## 2024-04-04 DIAGNOSIS — M51369 Other intervertebral disc degeneration, lumbar region without mention of lumbar back pain or lower extremity pain: Secondary | ICD-10-CM

## 2024-04-04 DIAGNOSIS — M5030 Other cervical disc degeneration, unspecified cervical region: Secondary | ICD-10-CM

## 2024-04-04 DIAGNOSIS — G894 Chronic pain syndrome: Secondary | ICD-10-CM

## 2024-04-04 NOTE — Telephone Encounter (Unsigned)
 Copied from CRM [id number]. Topic: Clinical - Medication Refill >> Apr 04, 2024  4:54 PM Berneda F wrote: Medication: oxyCODONE -acetaminophen  (PERCOCET/ROXICET) 5-325 MG tablet  Has the patient contacted their pharmacy? Yes (Agent: If no, request that the patient contact the pharmacy for the refill. If patient does not wish to contact the pharmacy document the reason why and proceed with request.) (Agent: If yes, when and what did the pharmacy advise?)  This is the patient's preferred pharmacy:   [facility] [address], [place] - [address] [place] [address] Phone: [phone] Fax: [phone]  Is this the correct pharmacy for this prescription? Yes If no, delete pharmacy and type the correct one.   Has the prescription been filled recently? No  Is the patient out of the medication? Yes, today is his last pill  Has the patient been seen for an appointment in the last year OR does the patient have an upcoming appointment? Yes  Can we respond through MyChart? Yes  Agent: Please be advised that Rx refills may take up to 3 business days. We ask that you follow-up with your pharmacy.

## 2024-04-05 MED ORDER — OXYCODONE-ACETAMINOPHEN 5-325 MG PO TABS: 1.0000 | ORAL_TABLET | Freq: Three times a day (TID) | ORAL | 0 refills | Status: DC | PRN

## 2024-04-13 ENCOUNTER — Other Ambulatory Visit: Payer: Self-pay | Admitting: Neurology

## 2024-04-13 ENCOUNTER — Ambulatory Visit (INDEPENDENT_AMBULATORY_CARE_PROVIDER_SITE_OTHER): Admitting: Neurology

## 2024-04-13 DIAGNOSIS — R269 Unspecified abnormalities of gait and mobility: Secondary | ICD-10-CM

## 2024-04-13 DIAGNOSIS — G6181 Chronic inflammatory demyelinating polyneuritis: Secondary | ICD-10-CM | POA: Diagnosis not present

## 2024-04-13 DIAGNOSIS — M792 Neuralgia and neuritis, unspecified: Secondary | ICD-10-CM

## 2024-04-13 MED ORDER — GABAPENTIN 300 MG PO CAPS: 1200.0000 mg | ORAL_CAPSULE | Freq: Three times a day (TID) | ORAL | 11 refills | Status: AC

## 2024-04-13 NOTE — Progress Notes (Signed)
 ASSESSMENT AND PLAN  Jonathan Luna is a 58 y.o. 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  MRI of neural axis, including brain, cervical thoracic lumbar spine showed no significant abnormalities to explain above difficulties,  Mild improvement with IVIG treatment in Dec, 2024,   He had no significant improvement plasma exchange (Jan 2025) Also on prednisone  60mg  daily since Jan 2025, decreased to 50mg  daily, to 40mg  on July 2nd 2025,   He had significant distal weakness, gait abnormality despite repeat IVIG treatment,   Start  Home infusion Rituximab in April 2025, and continue IVIG 1g/kg weekly  Repeat EMG/NCS on April 13, 2024 continue to demonstrate demyelinating polyradiculoneuropathy, with evidence of distal axonal loss, with apparent demyelinating features, hope he will continue to improve with treatment  Neuropathic pain:  gabapentin  300 mg 4 tablets 3 times a day, trileptal  300mg  tid prn, Cymbalta  60 mg daily, also on Percocet,      DIAGNOSTIC DATA (LABS, IMAGING, TESTING) - I reviewed patient records, labs, notes, testing and imaging myself where available.  Laboratory evaluations from January 28, 2024, hemoglobin of 15.1, MEDICAL HISTORY:  Jonathan Luna is a 58 year old male accompanied by his wife, seen in request by his primary care doctor Joshua Ned for evaluation of rapid onset gait abnormality, initial evaluation September 22, 2023  History is obtained from the patient and review of electronic medical records. I personally reviewed pertinent available imaging films in PACS.   PMHx of  Depression, Anxiety Chronic low back pain, on percocet 5/325 tid by Dr. Joshua HTN Pneumonia Hereditary cataract,   Left retinal detachment,  Right blood vessel   He suffered upper respiratory symptoms, pneumonia, was treated with antibiotic in the middle of October 2024.  Azithromycin , subsequently Augmentin   Few days later, he began to noticed numbness tingling starting at the bottom of his feet, toes, also burning sensation, by August 10, 2023, he noticed mild bilateral hands paresthesia, then had gradual onset gait abnormality, by November 11, when he walked to the car, he noticed unsteady gait,  His gait abnormality continue to getting worse, fell few times, lower extremity buckled underneath him, then around November 15, he began to have upper extremity symptoms, feel weak, shaky when holding object, shallow breathing, out of breath easily  She presented to emergency room August 28, 2023, heart rate documented 127, temperature of 101, blood pressure 157/100, was diagnosed with sepsis due to right upper lobe pneumonia, D-dimer was positive, PE protocol was negative, which CT chest showed posterior right upper lobe airspace and groundglass opacity, respiratory virus panel was negative  He was treated with IV ceftriaxone , azithromycin , followed by p.o. doxycycline , cefdinir , his upper respiratory symptoms has much improved, however, since discharge, he had persistent gait abnormality, weird achy pain involving upper and lower extremity, hypersensitivity of the neck running up and down his spine, twitching inside his body,  He drove himself, shop at [facility] on September 04, 2023, felt such gait abnormality, stiff weak lower extremity, shortness of breath, by the time he left [employer], he have to rely on a cart, to pull himself, see since then he relied on his wheelchair, has difficulty bearing weight  UPDATE September 30, 2023: He continues to have profound gait abnormality, no improvement  MRI of  the brain  showed no significant abnormality  MRI of cervical and thoracic spine showed mild degenerative  changes, no cord or nerve root compression, MRI lumbar spine showed prominent disc and facet degenerative changes, throughout, most prominent L4-5, moderate right, mild foraminal narrowing  Extensive laboratory evaluations showed elevated TSH, otherwise normal negative HIV, RPR, vitamin D , ANA A1c ESR C-reactive protein Lyme titer, vitamin D , protein electrophoresis, Lyme titer,  UPDATE Jan 16th 2025: He was admitted following last visit on September 30, 2023, received IVIG treatment, completed on October 04, 2023, reported mild improvement, was able to walk better, followed by inpatient rehabilitation, Lumbar puncture October 12, 2023, total protein of 139, WBC of 12, 84% lymphocyte, monocyte 14%  MRI of the brain December 19 was normal,  MRI of lumbar: Lower lumbar degenerative changes most noticeable L4-5, no canal stenosis moderate foraminal narrowing  MRI of thoracic spine only mild degenerative changes,  MRI of cervical spine, mild degenerative changes  But benefit from the IVIG from December only last for 3 weeks, over the past few weeks, he complains slow decline in functional status, now with profound bilateral upper and lower extremity weakness increased gait abnormality, shortness of breath with minimum exertion, was not able to continue his home PT,  Also complains of worsening neuropathic pain taking gabapentin  2400 mg daily,  Recent labs showed hyponatremia sodium 130, drink large quantity of free water at home  UPDATE December 14 2023: Patient was sent to hospital admission following last visit on October 29, 2023, underwent plasma exchange with no significant improvement in his weakness, gait abnormality  He was reloaded with IVIG on November 17, 2023, again on November 25, 2018  He only noticed mild improvement, his gait has improved some with bilateral AFO, physical therapy, ambulated with a walker  Laboratory evaluation from February 2024, TB QuantiFERON was negative, CMP  showed mild decreased albumin  of 3.1, CBC showed hemoglobin of 13.8,  He also remained on prednisone  60 mg daily  UPDATE January 05 2024: He continues to have significant gait abnormality distal weakness, neuropathic pain, on polypharmacy including gabapentin  900 mg 3 times a day, Cymbalta  60 mg daily Percocet 5/325 mg 3 times a day, clonazepam  0.5 mg as needed from his primary care  Able to ambulate with walker,  He is getting IVIG 1 g/kg every 3 weeks, there is a big financial concern with high deductible for his insurance, is looking for alternative such as home health IVIG  Rituximab prior authorization pending, if is approved, will add on current IVIG treatment  He is also on prednisone  60 mg daily  UPDATE May 15th 2025: Home infusion Rituximab 500mg  x2 2 weeks apart, finished at beginning of May 2025, tolerate it well, IVIG 1 g/kg every week (in 2 days), tolerating it well,  Prednisone  60mg  daily since Jan 2025.  Neuropathic pain: in hands and feet, as if going to explode, 6/10, gabapentin  900mg  tid, percocet 5/325 tid, tizanidine  2mg  tid, cymbalta  60mg  daily, trileptal  300mg  bid, pristiq  25mg  was started since May 2025, he has pain through out the day, difficulty sleeping.  He is improving, walked the whole [employer] pushing on shopping cart, feet is not as sensitive, still has profound distal arm and leg weakness  UPDATE April 13 2024: He is overall improving, tolerating rituximab, and also IVIG 1 g/kg every week, prednisone  dosage was decreased from 60 to 50 mg on Feb 25, 2024, lesser side effect, no worsening weakness noticed, he continues to have significant neuropathic pain,  on polypharmacy,  Repeat EMG nerve conduction study April 13, 2024, continue showed evidence of demyelinating neuropathy, with evidence of distal axonal loss, compared to previous study in December 2024, there is evidence of slight improvement  PHYSICAL EXAM:      04/13/2024   10:33 AM 04/13/2024   10:28 AM  02/25/2024    3:41 PM  Vitals with BMI  Height   5' 7  Weight   192 lbs 14 oz  BMI   30.21  Systolic 146 155 867  Diastolic 96 92 80  Pulse   62     PHYSICAL EXAMNIATION:  Gen: NAD, conversant, well nourised, well groomed        NEUROLOGICAL EXAM:  MENTAL STATUS: Speech/cognition: Awake, alert, oriented to history taking and casual conversation CRANIAL NERVES: CN II: Visual fields are full to confrontation.  Postsurgical irregular pupil  CN III, IV, VI: extraocular movement are normal. No ptosis. CN V: Facial sensation is intact to light touch CN VII: Face is symmetric with normal eye closure  CN VIII: Hearing is normal to causal conversation. CN IX, X: Phonation is normal. CN XI: Head turning and shoulder shrug are intact  MOTOR: No significant bilateral upper extremity proximal weakness, profound distal weakness, barely antigravity movement below wrist  mild bilateral hip flexion weakness, moderate to severe bilateral ankle dorsiflexion, plantarflexion weakness,  REFLEXES: Areflexia  SENSORY: Well-preserved toe vibratory sensation, proprioception, pinprick,    COORDINATION: Moderate truncal ataxia, mild  bilateral finger-to-nose heel-to-shin dysmetria  GAIT/STANCE: Need push-up to get up from seated position, rely on his walker, wide-based unsteady, ataxic gait  REVIEW OF SYSTEMS:  Full 14 system review of systems performed and notable only for as above All other review of systems were negative.   ALLERGIES: Allergies  Allergen Reactions   Amoxil  [Amoxicillin ] Other (See Comments)    Pt states that it does not work for him when he had back to back pneumonia   Effexor [Venlafaxine] Diarrhea   Prozac  [Fluoxetine  Hcl] Diarrhea    HOME MEDICATIONS: Current Outpatient Medications  Medication Sig Dispense Refill   amLODipine  (NORVASC ) 5 MG tablet Take 1 tablet (5 mg total) by mouth daily 30 tablet 0   aspirin  EC 81 MG tablet Take 81 mg by mouth daily. Swallow  whole.     Cholecalciferol  (VITAMIN D -3) 25 MCG (1000 UT) CAPS Take 1 capsule by mouth daily.     desvenlafaxine  (PRISTIQ ) 25 MG 24 hr tablet Take 25 mg by mouth daily.     DULoxetine  (CYMBALTA ) 30 MG capsule Take 1 capsule (30 mg total) by mouth daily. 90 capsule 0   DULoxetine  (CYMBALTA ) 60 MG capsule Take 1 capsule (60 mg total) by mouth daily. 30 capsule 11   fluticasone  (FLONASE ) 50 MCG/ACT nasal spray Place 2 sprays into both nostrils daily. 48 g 1   gabapentin  (NEURONTIN ) 300 MG capsule Take 4 capsules (1,200 mg total) by mouth 3 (three) times daily. 360 capsule 11   metoprolol  succinate (TOPROL -XL) 25 MG 24 hr tablet Take 1 tablet (25 mg total) by mouth daily 30 tablet 0   omega-3 acid ethyl esters (LOVAZA ) 1 g capsule Take 2 capsules (2 g total) by mouth 2 (two) times daily. 360 capsule 1   Oxcarbazepine  (TRILEPTAL ) 300 MG tablet Take 1 tablet (300 mg total) by mouth 3 (three) times daily. 90 tablet 11   oxyCODONE -acetaminophen  (PERCOCET/ROXICET) 5-325 MG tablet Take 1 tablet by mouth every 8 (eight) hours as needed for severe pain (pain score 7-10).  90 tablet 0   predniSONE  (DELTASONE ) 10 MG tablet Take 20 mg by mouth in the morning, at noon, and at bedtime.     tizanidine  (ZANAFLEX ) 2 MG capsule Take 1 capsule (2 mg total) by mouth 3 (three) times daily as needed for muscle spasms. 90 capsule 5   UNITHROID  137 MCG tablet Take 1 tablet (137 mcg total) by mouth daily before breakfast. 90 tablet 1   No current facility-administered medications for this visit.    PAST MEDICAL HISTORY: Past Medical History:  Diagnosis Date   Allergy     Anxiety    Arthritis    Cataract    Community acquired pneumonia 08/28/2023   Depression    Detached retina    Heart murmur    History of bilateral cataract extraction    Hyperlipidemia    Hypothyroidism    Low back pain    Panic attack    Panic attacks    Pneumonia of right upper lobe due to infectious organism 08/18/2023    PAST SURGICAL  HISTORY: Past Surgical History:  Procedure Laterality Date   CATARACT EXTRACTION     EYE SURGERY     IR FLUORO GUIDE CV LINE RIGHT  10/29/2023   IR US  GUIDE VASC ACCESS RIGHT  10/29/2023    FAMILY HISTORY: Family History  Problem Relation Age of Onset   Breast cancer Mother    Hypertension Father    Heart disease Father    Asthma Father    Emphysema Father        smoker for 45 years   Diabetes Brother    Hyperlipidemia Brother     SOCIAL HISTORY: Social History   Socioeconomic History   Marital status: Married    Spouse name: Not on file   Number of children: 0   Years of education: Not on file   Highest education level: Not on file  Occupational History   Occupation: Cumputer operator/security clearance [employer]  Tobacco Use   Smoking status: Never    Passive exposure: Past   Smokeless tobacco: Never   Tobacco comments:    As a teenager  Vaping Use   Vaping status: Never Used  Substance and Sexual Activity   Alcohol use: Not Currently   Drug use: No   Sexual activity: Yes    Birth control/protection: Condom  Other Topics Concern   Not on file  Social History Narrative   Caffienated drinks-no   Seat belt use often-yes   Regular Exercise-yes   Smoke alarm in the home-yes   Firearms/guns in the home-no   History of physical abuse-no               Social Drivers of Health   Financial Resource Strain: Patient Declined (05/27/2023)   Overall Financial Resource Strain (CARDIA)    Difficulty of Paying Living Expenses: Patient declined  Food Insecurity: No Food Insecurity (10/29/2023)   Hunger Vital Sign    Worried About Running Out of Food in the Last Year: Never true    Ran Out of Food in the Last Year: Never true  Transportation Needs: No Transportation Needs (10/29/2023)   PRAPARE - Administrator, Civil Service (Medical): No    Lack of Transportation (Non-Medical): No  Physical Activity: Insufficiently Active (05/27/2023)   Exercise  Vital Sign    Days of Exercise per Week: 4 days    Minutes of Exercise per Session: 30 min  Stress: Stress Concern Present (05/27/2023)   [place]  [employer] - Occupational Stress Questionnaire    Feeling of Stress : Rather much  Social Connections: Unknown (05/27/2023)   Social Connection and Isolation Panel    Frequency of Communication with Friends and Family: Patient declined    Frequency of Social Gatherings with Friends and Family: Once a week    Attends Religious Services: More than 4 times per year    Active Member of [employer] or Organizations: Yes    Attends Banker Meetings: More than 4 times per year    Marital Status: Patient declined  Intimate Partner Violence: Not At Risk (10/29/2023)   Humiliation, Afraid, Rape, and Kick questionnaire    Fear of Current or Ex-Partner: No    Emotionally Abused: No    Physically Abused: No    Sexually Abused: No      Modena Callander, M.D. Ph.D.  [facility] [address] [place], [place] [address] Ph: [phone] Fax: [phone]  CC:  Joshua Debby CROME, MD [address] [place],  [place] [address]  Joshua Debby CROME, MD

## 2024-04-13 NOTE — Procedures (Signed)
 Full Name: Jonathan Luna Gender: Male MRN #: [id number] Date of Birth: March 15, 1966    Visit Date: 04/13/2024 11:11 Age: 58 Years Examining Physician: Onita Duos Referring Physician: Onita Duos Height: 5 feet 7 inch History: 58 year old male presenting with subacute ascending paresthesia gait abnormality in October 2024 following his upper respiratory infection,  Summary of the test: Nerve conduction study: Left sural, superficial peroneal, median, ulnar, radial sensory responses were well-preserved, within normal limit.  Left tibial motor response showed mildly decreased CMAP amplitude, temporal dispersion, prolonged F-wave latency  Left median, ulnar, peroneal to EDB motor response also showed evidence of temporal dispersion, with prolonged F-wave latency at left median, ulnar motor responses.  Electromyography: Selected needle examination were performed at the left upper, lower extremity muscles, left lumbar and cervical paraspinal muscles.  There is evidence of active denervation at the distal limb muscles, involving upper lower extremity, Proximal upper lower extremity muscle needle examination showed no significant abnormality  Conclusion: This is an abnormal study.  There is continued evidence of demyelinating polyradiculoneuropathy, with evidence of distal axonal loss. Compared to previous study in December 2024, there is mild improvement.  Neuropathy mainly involving motor component, with well-preserved sensory component.    ------------------------------- Duos Onita. M.D. Ph.D.   [facility] [address], [address] [place], [place] [address] Tel: [phone] Fax: [phone]  Verbal informed consent was obtained from the patient, patient was informed of potential risk of procedure, including bruising, bleeding, hematoma formation, infection, muscle weakness, muscle pain, numbness, among others.        MNC    Nerve / Sites Muscle Latency Ref.  Amplitude Ref. Rel Amp Segments Distance Velocity Ref. Area    ms ms mV mV %  cm m/s m/s mVms  L Median - APB     Wrist APB 3.5 <=4.4 4.4 >=4.0 100 Wrist - APB 7   8.0     Upper arm APB 8.6  1.5  34.5 Upper arm - Wrist 24 47 >=49 6.4  L Ulnar - ADM     Wrist ADM 3.1 <=3.3 2.7 >=6.0 100 Wrist - ADM 7   8.3     B.Elbow ADM 6.5  0.6  21.3 B.Elbow - Wrist 15 44 >=49 0.9     A.Elbow ADM 9.0  1.4  249 A.Elbow - B.Elbow 10 40 >=49 5.9  L Peroneal - EDB     Ankle EDB 5.7 <=6.5 1.0 >=2.0 100 Ankle - EDB 9   2.4     Fib head EDB 13.6  0.6  63.1 Fib head - Ankle 28 36 >=44 3.0     Pop fossa EDB 16.7  0.7  108 Pop fossa - Fib head 12 39 >=44 0.1         Pop fossa - Ankle      L Tibial - AH     Ankle AH 5.7 <=5.8 3.8 >=4.0 100 Ankle - AH 9   13.0     Pop fossa AH 17.1  0.7  19.5 Pop fossa - Ankle 38 33 >=41 1.6             SNC    Nerve / Sites Rec. Site Peak Lat Ref.  Amp Ref. Segments Distance    ms ms V V  cm  L Radial - Anatomical snuff box (Forearm)     Forearm Wrist 1.8 <=2.9 10 >=15 Forearm - Wrist 10  L Sural - Ankle (Calf)  Calf Ankle 4.1 <=4.4 16 >=6 Calf - Ankle 14  L Superficial peroneal - Ankle     Lat leg Ankle 4.3 <=4.4 7 >=6 Lat leg - Ankle 14  L Median - Orthodromic (Dig II, Mid palm)     Dig II Wrist 2.8 <=3.4 5 >=10 Dig II - Wrist 13  L Ulnar - Orthodromic, (Dig V, Mid palm)     Dig V Wrist 2.8 <=3.1 5 >=5 Dig V - Wrist 69               F  Wave    Nerve F Lat Ref.   ms ms  L Tibial - AH 63.3 <=56.0  L Median - APB 38.4 <=31.0  L Ulnar - ADM 39.0 <=32.0           EMG Summary Table    Spontaneous MUAP Recruitment  Muscle IA Fib PSW Fasc Other Amp Dur. Poly Pattern  L. Tibialis anterior Increased 1+ None None _______ Increased Increased 1+ Reduced  L. Tibialis posterior Increased 1+ None None _______ Increased Increased 1+ Reduced  L. Peroneus longus Increased 1+ None None _______ Increased Increased 1+ Reduced  L. Gastrocnemius (Medial head) Increased None None  None _______ Normal Normal Normal Reduced  L. Vastus lateralis Normal None None None _______ Normal Normal Normal Reduced  L. Lumbar paraspinals (low) Normal None None None _______ Normal Normal Normal Normal  L. Lumbar paraspinals (mid) Normal None None None _______ Normal Normal Normal Normal  L. First dorsal interosseous Increased 1+ 1+ None _______ Increased Increased 1+ Reduced  L. Flexor digitorum profundus (Median) Increased 1+ None None _______ Increased Increased 1+ Reduced  L. Extensor digitorum communis Increased 2+ 2+ None _______ Increased Increased 1+ Reduced  L. Brachioradialis Increased 1+ None None _______ Increased Increased 1+ Reduced  L. Biceps brachii Normal None None None _______ Normal Normal Normal Reduced  L. Deltoid Increased None None None _______ Normal Normal Normal Reduced  L. Triceps brachii Increased None None None _______ Normal Normal Normal Reduced  L. Cervical paraspinals Normal None None None _______ Normal Normal Normal Normal

## 2024-04-16 LAB — CBC WITH DIFFERENTIAL/PLATELET
Basophils Absolute: 0.1 x10E3/uL (ref 0.0–0.2)
Basos: 1 %
EOS (ABSOLUTE): 0 x10E3/uL (ref 0.0–0.4)
Eos: 0 %
Hematocrit: 42.8 % (ref 37.5–51.0)
Hemoglobin: 13.4 g/dL (ref 13.0–17.7)
Immature Grans (Abs): 0.2 x10E3/uL — ABNORMAL HIGH (ref 0.0–0.1)
Immature Granulocytes: 1 %
Lymphocytes Absolute: 0.9 x10E3/uL (ref 0.7–3.1)
Lymphs: 8 %
MCH: 30.4 pg (ref 26.6–33.0)
MCHC: 31.3 g/dL — ABNORMAL LOW (ref 31.5–35.7)
MCV: 97 fL (ref 79–97)
Monocytes Absolute: 0.3 x10E3/uL (ref 0.1–0.9)
Monocytes: 2 %
Neutrophils Absolute: 10.4 x10E3/uL — ABNORMAL HIGH (ref 1.4–7.0)
Neutrophils: 88 %
Platelets: 328 x10E3/uL (ref 150–450)
RBC: 4.41 x10E6/uL (ref 4.14–5.80)
RDW: 13.4 % (ref 11.6–15.4)
WBC: 11.8 x10E3/uL — ABNORMAL HIGH (ref 3.4–10.8)

## 2024-04-16 LAB — CD20 B CELLS
% CD19-B Cells: 0.5 % — ABNORMAL LOW (ref 4.6–22.1)
% CD20-B Cells: 0.5 % — ABNORMAL LOW (ref 5.0–22.3)

## 2024-04-16 LAB — COMPREHENSIVE METABOLIC PANEL WITH GFR
ALT: 24 IU/L (ref 0–44)
AST: 26 IU/L (ref 0–40)
Albumin: 3.8 g/dL (ref 3.8–4.9)
Alkaline Phosphatase: 39 IU/L — ABNORMAL LOW (ref 44–121)
BUN/Creatinine Ratio: 24 — ABNORMAL HIGH (ref 9–20)
BUN: 19 mg/dL (ref 6–24)
Bilirubin Total: 0.3 mg/dL (ref 0.0–1.2)
CO2: 19 mmol/L — ABNORMAL LOW (ref 20–29)
Calcium: 9.4 mg/dL (ref 8.7–10.2)
Chloride: 100 mmol/L (ref 96–106)
Creatinine, Ser: 0.78 mg/dL (ref 0.76–1.27)
Globulin, Total: 4.6 g/dL — ABNORMAL HIGH (ref 1.5–4.5)
Glucose: 104 mg/dL — ABNORMAL HIGH (ref 70–99)
Potassium: 5.2 mmol/L (ref 3.5–5.2)
Sodium: 134 mmol/L (ref 134–144)
Total Protein: 8.4 g/dL (ref 6.0–8.5)
eGFR: 104 mL/min/1.73 (ref >59)

## 2024-04-16 LAB — HEMOGLOBIN A1C
Est. average glucose Bld gHb Est-mCnc: 103 mg/dL
Hgb A1c MFr Bld: 5.2 % (ref 4.8–5.6)

## 2024-04-16 LAB — IGG, IGA, IGM
IgA/Immunoglobulin A, Serum: 371 mg/dL (ref 90–386)
IgG (Immunoglobin G), Serum: 3733 mg/dL — ABNORMAL HIGH (ref 603–1613)
IgM (Immunoglobulin M), Srm: 102 mg/dL (ref 20–172)

## 2024-04-18 ENCOUNTER — Ambulatory Visit: Payer: Self-pay | Admitting: Neurology

## 2024-04-19 ENCOUNTER — Other Ambulatory Visit: Payer: Self-pay | Admitting: Neurology

## 2024-04-19 ENCOUNTER — Other Ambulatory Visit: Payer: Self-pay | Admitting: Family Medicine

## 2024-04-19 ENCOUNTER — Other Ambulatory Visit: Payer: Self-pay | Admitting: Internal Medicine

## 2024-04-19 DIAGNOSIS — F331 Major depressive disorder, recurrent, moderate: Secondary | ICD-10-CM

## 2024-04-19 DIAGNOSIS — I10 Essential (primary) hypertension: Secondary | ICD-10-CM

## 2024-04-19 DIAGNOSIS — F410 Panic disorder [episodic paroxysmal anxiety] without agoraphobia: Secondary | ICD-10-CM

## 2024-04-19 DIAGNOSIS — R000 Tachycardia, unspecified: Secondary | ICD-10-CM

## 2024-04-19 DIAGNOSIS — G6181 Chronic inflammatory demyelinating polyneuritis: Secondary | ICD-10-CM

## 2024-04-27 ENCOUNTER — Telehealth: Payer: Self-pay | Admitting: Neurology

## 2024-04-27 NOTE — Telephone Encounter (Signed)
 [place] from [facility] has called for April, RN

## 2024-04-27 NOTE — Telephone Encounter (Signed)
 [facility] stated that the current nursing company [facility] discharged him as he made them to feel uncomfortable. He kept changing availability, being manipulative, he made 4 females nurses feel this way. The company sent a male nurse and the pt declines to see male nurses.   A new nursing company called [facility] will be going out to him this week.

## 2024-05-03 ENCOUNTER — Other Ambulatory Visit: Payer: Self-pay | Admitting: Internal Medicine

## 2024-05-03 DIAGNOSIS — G894 Chronic pain syndrome: Secondary | ICD-10-CM

## 2024-05-03 DIAGNOSIS — M51369 Other intervertebral disc degeneration, lumbar region without mention of lumbar back pain or lower extremity pain: Secondary | ICD-10-CM

## 2024-05-03 DIAGNOSIS — M545 Low back pain, unspecified: Secondary | ICD-10-CM

## 2024-05-03 DIAGNOSIS — M5030 Other cervical disc degeneration, unspecified cervical region: Secondary | ICD-10-CM

## 2024-05-03 NOTE — Telephone Encounter (Signed)
 Requesting: Oxycodone  5-325 Contract: N/A UDS: N/A Last Visit: 01/19/2024 Next Visit: 05/23/2024 Last Refill: 04/05/2024  Please Advise

## 2024-05-16 ENCOUNTER — Encounter: Payer: Self-pay | Admitting: Neurology

## 2024-05-21 ENCOUNTER — Other Ambulatory Visit: Payer: Self-pay | Admitting: Neurology

## 2024-05-21 ENCOUNTER — Other Ambulatory Visit: Payer: Self-pay | Admitting: Internal Medicine

## 2024-05-21 ENCOUNTER — Other Ambulatory Visit: Payer: Self-pay | Admitting: Family Medicine

## 2024-05-21 DIAGNOSIS — J301 Allergic rhinitis due to pollen: Secondary | ICD-10-CM

## 2024-05-23 ENCOUNTER — Ambulatory Visit: Admitting: Internal Medicine

## 2024-05-24 ENCOUNTER — Telehealth: Payer: Self-pay | Admitting: Gastroenterology

## 2024-05-24 NOTE — Telephone Encounter (Signed)
 Jonathan Luna came into the office and said Dr. Onita wanted to see him in September to possibly lower his prednisone . He also stated he had blood drawn/urine 7/2 visit and has not seen any blood results. Can you call him to discuss.Jonathan Luna is holding disability paperwork the conversation you have will determine if he is going to proceed.

## 2024-05-25 NOTE — Telephone Encounter (Signed)
 He does suffer significant difficulty from his CIDP, will fill form accordingly

## 2024-05-25 NOTE — Telephone Encounter (Signed)
 Ok to move up his appt with me in sep

## 2024-05-25 NOTE — Telephone Encounter (Signed)
 Scheduled sooner appt as requested. Pt stated that he would wait to pay the disability paper from completion fee at his next appt on9/4/25. Pt voiced understanding that we cannot start completing the form until payment is received and that once payment is obtained we have 14 business days to complete the form.

## 2024-05-25 NOTE — Telephone Encounter (Signed)
 Called and spoke to pt and he stated that at last visit 04/13/24 who stated that they needed to be seen in September to discuss prednisone .   Patient stated that he needed us  to fill out disability paperwork. I stated that there would be a $50 form fee. Pt would like the forms to reflect not going back to work and pt wants to continue pt and ot. I found the documents scanned in under mychart. I expressed I would ask Dr. Onita if she is agreeable to completing disability paperwork. Then collect payment and process paperwork.

## 2024-05-27 ENCOUNTER — Encounter: Payer: Self-pay | Admitting: Neurology

## 2024-05-27 DIAGNOSIS — R269 Unspecified abnormalities of gait and mobility: Secondary | ICD-10-CM

## 2024-05-27 DIAGNOSIS — G6181 Chronic inflammatory demyelinating polyneuritis: Secondary | ICD-10-CM

## 2024-05-27 DIAGNOSIS — G610 Guillain-Barre syndrome: Secondary | ICD-10-CM

## 2024-05-31 NOTE — Telephone Encounter (Signed)
 Please sent more PT/OT to his preferred facility, and let him aware that it is approval depending on his insurance.

## 2024-06-01 ENCOUNTER — Telehealth: Payer: Self-pay | Admitting: Neurology

## 2024-06-01 NOTE — Addendum Note (Signed)
 Addended by: ONEITA NEVELYN BRAVO on: 06/01/2024 09:13 AM   Modules accepted: Orders

## 2024-06-01 NOTE — Telephone Encounter (Signed)
 Referral for Physical & Occupationa; therapy faxed to [facility]    [facility] Phone: [phone] Fax: [phone]

## 2024-06-07 ENCOUNTER — Encounter: Payer: Self-pay | Admitting: Internal Medicine

## 2024-06-07 ENCOUNTER — Ambulatory Visit: Payer: Self-pay | Admitting: Internal Medicine

## 2024-06-07 ENCOUNTER — Other Ambulatory Visit: Payer: Self-pay | Admitting: Medical Genetics

## 2024-06-07 ENCOUNTER — Ambulatory Visit (INDEPENDENT_AMBULATORY_CARE_PROVIDER_SITE_OTHER)

## 2024-06-07 ENCOUNTER — Ambulatory Visit (INDEPENDENT_AMBULATORY_CARE_PROVIDER_SITE_OTHER): Admitting: Internal Medicine

## 2024-06-07 VITALS — BP 130/88 | HR 93 | Temp 98.3°F | Ht 67.0 in | Wt 187.4 lb

## 2024-06-07 DIAGNOSIS — E039 Hypothyroidism, unspecified: Secondary | ICD-10-CM | POA: Diagnosis not present

## 2024-06-07 DIAGNOSIS — R0789 Other chest pain: Secondary | ICD-10-CM

## 2024-06-07 DIAGNOSIS — M5030 Other cervical disc degeneration, unspecified cervical region: Secondary | ICD-10-CM | POA: Diagnosis not present

## 2024-06-07 DIAGNOSIS — E781 Pure hyperglyceridemia: Secondary | ICD-10-CM

## 2024-06-07 DIAGNOSIS — G894 Chronic pain syndrome: Secondary | ICD-10-CM

## 2024-06-07 DIAGNOSIS — M545 Low back pain, unspecified: Secondary | ICD-10-CM | POA: Insufficient documentation

## 2024-06-07 DIAGNOSIS — E785 Hyperlipidemia, unspecified: Secondary | ICD-10-CM | POA: Diagnosis not present

## 2024-06-07 DIAGNOSIS — M51369 Other intervertebral disc degeneration, lumbar region without mention of lumbar back pain or lower extremity pain: Secondary | ICD-10-CM

## 2024-06-07 DIAGNOSIS — I10 Essential (primary) hypertension: Secondary | ICD-10-CM

## 2024-06-07 DIAGNOSIS — F1920 Other psychoactive substance dependence, uncomplicated: Secondary | ICD-10-CM

## 2024-06-07 LAB — BASIC METABOLIC PANEL WITH GFR
BUN: 14 mg/dL (ref 6–23)
CO2: 23 meq/L (ref 19–32)
Calcium: 9.3 mg/dL (ref 8.4–10.5)
Chloride: 98 meq/L (ref 96–112)
Creatinine, Ser: 0.82 mg/dL (ref 0.40–1.50)
GFR: 97.09 mL/min (ref >60.00)
Glucose, Bld: 107 mg/dL — ABNORMAL HIGH (ref 70–99)
Potassium: 4.2 meq/L (ref 3.5–5.1)
Sodium: 132 meq/L — ABNORMAL LOW (ref 135–145)

## 2024-06-07 LAB — TSH: TSH: 0.71 u[IU]/mL (ref 0.35–5.50)

## 2024-06-07 LAB — LIPID PANEL
Cholesterol: 217 mg/dL — ABNORMAL HIGH (ref 0–200)
HDL: 52.5 mg/dL (ref >39.00)
LDL Cholesterol: 136 mg/dL — ABNORMAL HIGH (ref 0–99)
NonHDL: 164.52
Total CHOL/HDL Ratio: 4
Triglycerides: 143 mg/dL (ref 0.0–149.0)
VLDL: 28.6 mg/dL (ref 0.0–40.0)

## 2024-06-07 LAB — D-DIMER, QUANTITATIVE: D-Dimer, Quant: 0.62 ug{FEU}/mL — ABNORMAL HIGH (ref <0.50)

## 2024-06-07 LAB — C-REACTIVE PROTEIN: CRP: <1 mg/dL (ref 0.5–20.0)

## 2024-06-07 LAB — CK: Total CK: 38 U/L (ref 17–232)

## 2024-06-07 MED ORDER — UNITHROID 125 MCG PO TABS: 125.0000 ug | ORAL_TABLET | Freq: Every day | ORAL | 1 refills | Status: DC

## 2024-06-07 MED ORDER — OXYCODONE-ACETAMINOPHEN 5-325 MG PO TABS
1.0000 | ORAL_TABLET | Freq: Three times a day (TID) | ORAL | 0 refills | Status: DC | PRN
Start: 2024-06-07 — End: 2024-07-08

## 2024-06-07 NOTE — Patient Instructions (Signed)
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
## 2024-06-10 ENCOUNTER — Other Ambulatory Visit: Payer: Self-pay | Admitting: Internal Medicine

## 2024-06-10 DIAGNOSIS — F331 Major depressive disorder, recurrent, moderate: Secondary | ICD-10-CM

## 2024-06-10 DIAGNOSIS — E039 Hypothyroidism, unspecified: Secondary | ICD-10-CM

## 2024-06-10 DIAGNOSIS — F410 Panic disorder [episodic paroxysmal anxiety] without agoraphobia: Secondary | ICD-10-CM

## 2024-06-10 MED ORDER — TRAZODONE HCL 100 MG PO TABS
100.0000 mg | ORAL_TABLET | Freq: Every day | ORAL | 1 refills | Status: DC
Start: 2024-06-10 — End: 2024-09-06

## 2024-06-10 MED ORDER — UNITHROID 125 MCG PO TABS: 125.0000 ug | ORAL_TABLET | Freq: Every day | ORAL | 1 refills | Status: AC

## 2024-06-16 ENCOUNTER — Telehealth: Payer: Self-pay | Admitting: Neurology

## 2024-06-16 ENCOUNTER — Ambulatory Visit: Admitting: Neurology

## 2024-06-16 ENCOUNTER — Encounter: Payer: Self-pay | Admitting: Neurology

## 2024-06-16 VITALS — BP 136/82 | HR 95 | Temp 98.1°F | Wt 189.0 lb

## 2024-06-16 DIAGNOSIS — M545 Low back pain, unspecified: Secondary | ICD-10-CM | POA: Diagnosis not present

## 2024-06-16 DIAGNOSIS — R269 Unspecified abnormalities of gait and mobility: Secondary | ICD-10-CM | POA: Diagnosis not present

## 2024-06-16 DIAGNOSIS — G6181 Chronic inflammatory demyelinating polyneuritis: Secondary | ICD-10-CM | POA: Diagnosis not present

## 2024-06-16 DIAGNOSIS — M792 Neuralgia and neuritis, unspecified: Secondary | ICD-10-CM

## 2024-06-16 MED ORDER — DULOXETINE HCL 60 MG PO CPEP: 60.0000 mg | ORAL_CAPSULE | Freq: Every day | ORAL | 3 refills | Status: AC

## 2024-06-16 MED ORDER — PREDNISONE 10 MG PO TABS: 30.0000 mg | ORAL_TABLET | Freq: Every day | ORAL | 6 refills | Status: AC

## 2024-06-16 NOTE — Progress Notes (Addendum)
 ASSESSMENT AND PLAN  Jonathan Luna is a 58 y.o. male   CIDP variant Subacute onset, progressive worsening gait abnormality, limb paresthesia, neuropathic pain since October 2024, following this upper respiratory infection, there are significant cerebellum signs, finger-to-nose, heel-to-shin dysmetria, truncal ataxia,  CSF from Oct 12, 2023 showed cytoalbumin dissociation, TP 139.  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 Joshua.      DIAGNOSTIC DATA (LABS, IMAGING, TESTING) - I reviewed patient records, labs, notes, testing and imaging myself where available.  Laboratory evaluations from January 28, 2024, hemoglobin of 15.1, August 12, 2024, hemoglobin of 14.5, MEDICAL HISTORY:  Jonathan Luna is a 58 year old male accompanied by his wife, seen in request by his primary care doctor Joshua Ned for evaluation of rapid onset gait  abnormality, initial evaluation September 22, 2023  History is obtained from the patient and review of electronic medical records. I personally reviewed pertinent available imaging films in PACS.   PMHx of  Depression, Anxiety Chronic low back pain, on percocet 5/325 tid by Dr. Joshua HTN Pneumonia Hereditary cataract,  Left retinal detachment,  Right blood vessel   He suffered upper respiratory symptoms, pneumonia, was treated with antibiotic in the middle of October 2024.  Azithromycin , subsequently Augmentin   Few days later, he began to noticed numbness tingling starting at the bottom of his feet, toes, also burning sensation, by August 10, 2023, he noticed mild bilateral hands paresthesia, then had gradual onset gait abnormality, by November 11, when he walked to the car, he noticed unsteady gait,  His gait abnormality continue to getting worse, fell few times, lower extremity buckled underneath him, then around November 15, he began to have upper extremity symptoms, feel weak, shaky when holding object, shallow breathing, out of breath easily  She presented to emergency room August 28, 2023, heart rate documented 127, temperature of 101, blood pressure 157/100, was diagnosed with sepsis due to right upper lobe pneumonia, D-dimer was positive, PE protocol was negative, which CT chest showed posterior right upper lobe airspace and groundglass opacity, respiratory virus panel was negative  He was treated with IV ceftriaxone , azithromycin , followed by p.o. doxycycline , cefdinir , his upper respiratory symptoms has much improved, however, since discharge, he had persistent gait abnormality, weird achy pain involving upper and lower extremity, hypersensitivity of the neck running up and down his spine, twitching inside his body,  He drove himself, shop at [facility] on September 04, 2023, felt such gait abnormality, stiff weak lower  extremity, shortness of breath, by the time he left [employer], he have  to rely on a cart, to pull himself, see since then he relied on his wheelchair, has difficulty bearing weight  UPDATE September 30, 2023: He continues to have profound gait abnormality, no improvement  MRI of the brain  showed no significant abnormality  MRI of cervical and thoracic spine showed mild degenerative changes, no cord or nerve root compression, MRI lumbar spine showed prominent disc and facet degenerative changes, throughout, most prominent L4-5, moderate right, mild foraminal narrowing  Extensive laboratory evaluations showed elevated TSH, otherwise normal negative HIV, RPR, vitamin D , ANA A1c ESR C-reactive protein Lyme titer, vitamin D , protein electrophoresis, Lyme titer,  UPDATE Jan 16th 2025: He was admitted following last visit on September 30, 2023, received IVIG treatment, completed on October 04, 2023, reported mild improvement, was able to walk better, followed by inpatient rehabilitation, Lumbar puncture October 12, 2023, total protein of 139, WBC of 12, 84% lymphocyte, monocyte 14%  MRI of the brain December 19 was normal,  MRI of lumbar: Lower lumbar degenerative changes most noticeable L4-5, no canal stenosis moderate foraminal narrowing  MRI of thoracic spine only mild degenerative changes,  MRI of cervical spine, mild degenerative changes  But benefit from the IVIG from December only last for 3 weeks, over the past few weeks, he complains slow decline in functional status, now with profound bilateral upper and lower extremity weakness increased gait abnormality, shortness of breath with minimum exertion, was not able to continue his home PT,  Also complains of worsening neuropathic pain taking gabapentin  2400 mg daily,  Recent labs showed hyponatremia sodium 130, drink large quantity of free water at home  UPDATE December 14 2023: Patient was sent to hospital admission following last visit on October 29, 2023, underwent plasma exchange with no significant  improvement in his weakness, gait abnormality  He was reloaded with IVIG on November 17, 2023, again on November 25, 2018  He only noticed mild improvement, his gait has improved some with bilateral AFO, physical therapy, ambulated with a walker  Laboratory evaluation from February 2024, TB QuantiFERON was negative, CMP showed mild decreased albumin  of 3.1, CBC showed hemoglobin of 13.8,  He also remained on prednisone  60 mg daily  UPDATE January 05 2024: He continues to have significant gait abnormality distal weakness, neuropathic pain, on polypharmacy including gabapentin  900 mg 3 times a day, Cymbalta  60 mg daily Percocet 5/325 mg 3 times a day, clonazepam  0.5 mg as needed from his primary care  Able to ambulate with walker,  He is getting IVIG 1 g/kg every 3 weeks, there is a big financial concern with high deductible for his insurance, is looking for alternative such as home health IVIG  Rituximab prior authorization pending, if is approved, will add on current IVIG treatment  He is also on prednisone  60 mg daily  UPDATE May 15th 2025: Home infusion Rituximab 500mg  x2 2 weeks apart, finished at beginning of May 2025, tolerate it well, IVIG 1 g/kg every week (in 2 days), tolerating it well,  Prednisone  60mg  daily since Jan 2025.  Neuropathic pain: in hands and feet, as if going to explode, 6/10, gabapentin  900mg  tid, percocet 5/325 tid, tizanidine  2mg  tid, cymbalta  60mg  daily, trileptal  300mg  bid, pristiq  25mg  was started since May 2025, he has pain through out the day, difficulty sleeping.  He is improving, walked the whole [employer] pushing on shopping cart, feet is not as sensitive, still has profound distal  arm and leg weakness  UPDATE April 13 2024: He is overall improving, tolerating rituximab, and also IVIG 1 g/kg every week, prednisone  dosage was decreased from 60 to 50 mg on Feb 25, 2024, lesser side effect, no worsening weakness noticed, he continues to have significant  neuropathic pain, on polypharmacy,  Repeat EMG nerve conduction study April 13, 2024, continue showed evidence of demyelinating neuropathy, with evidence of distal axonal loss, compared to previous study in December 2024, there is evidence of slight improvement  UPDATE Sept 4th 2025: He is accompanied by his wife, continue to improve slowly, better gait, ambulate without assistance now, without ankle brace, still have significant upper extremity difficulty, dysmetria,  On schedule for rituximab in October, also taking prednisone  40 mg since August, has weight gain, moon face. Continue to have neuropathic pain, on polypharmacy gabapentin  up to 3600 mg daily, Trileptal  300 mg 3 times a day, trazodone  100 mg at bedtime,  He is frustrated about his disability status, he works at [employer] job, but still have significant finger weakness, not enough dexterity  He also complains of intermittent flareup of low back pain, more on left side   PHYSICAL EXAM:      06/16/2024    8:50 AM 06/07/2024   10:54 AM 04/13/2024   10:33 AM  Vitals with BMI  Height  5' 7   Weight 189 lbs 187 lbs 6 oz   BMI 29.59 29.34   Systolic 136 130 853  Diastolic 82 88 96  Pulse 95 93      PHYSICAL EXAMNIATION:  Gen: NAD, conversant, well nourised, well groomed        NEUROLOGICAL EXAM:  MENTAL STATUS: Speech/cognition: Awake, alert, oriented to history taking and casual conversation CRANIAL NERVES: CN II: Visual fields are full to confrontation.  Postsurgical irregular pupil  CN III, IV, VI: extraocular movement are normal. No ptosis. CN V: Facial sensation is intact to light touch CN VII: Face is symmetric with normal eye closure  CN VIII: Hearing is normal to causal conversation. CN IX, X: Phonation is normal. CN XI: Head turning and shoulder shrug are intact  MOTOR: No significant bilateral upper extremity proximal weakness, profound distal upper extremity weakness, no antigravity movement of wrist  extension, 4- out of 5 for wrist flexion, grip  mild bilateral hip flexion weakness, moderate bilateral ankle dorsiflexion, plantarflexion weakness,  REFLEXES: Areflexia  SENSORY: Well-preserved toe vibratory sensation, proprioception, pinprick,    COORDINATION: Mild truncal ataxia, mild  bilateral finger-to-nose dysmetria, better heel-to-shin now  GAIT/STANCE: Need push-up to get up from seated position, ambulate without assistance, unsteady, wide-based mildly ataxic gait  REVIEW OF SYSTEMS:  Full 14 system review of systems performed and notable only for as above All other review of systems were negative.   ALLERGIES: Allergies  Allergen Reactions   Amoxil  [Amoxicillin ] Other (See Comments)    Pt states that it does not work for him when he had back to back pneumonia   Effexor [Venlafaxine] Diarrhea   Prozac  [Fluoxetine  Hcl] Diarrhea    HOME MEDICATIONS: Current Outpatient Medications  Medication Sig Dispense Refill   amLODipine  (NORVASC ) 5 MG tablet Take 1 tablet (5 mg total) by mouth daily 30 tablet 0   aspirin  EC 81 MG tablet Take 81 mg by mouth daily. Swallow whole.     Cholecalciferol  (VITAMIN D -3) 25 MCG (1000 UT) CAPS Take 1 capsule by mouth daily.     DULoxetine  (CYMBALTA ) 30 MG capsule Take 1 capsule (30 mg total) by  mouth daily. 90 capsule 0   DULoxetine  (CYMBALTA ) 60 MG capsule Take 1 capsule (60 mg total) by mouth daily. 30 capsule 11   fluticasone  (FLONASE ) 50 MCG/ACT nasal spray Place 2 sprays into both nostrils daily. 48 g 0   gabapentin  (NEURONTIN ) 300 MG capsule Take 4 capsules (1,200 mg total) by mouth 3 (three) times daily. 360 capsule 11   metoprolol  succinate (TOPROL -XL) 25 MG 24 hr tablet Take 1 tablet (25 mg total) by mouth daily. 90 tablet 0   omega-3 acid ethyl esters (LOVAZA ) 1 g capsule Take 2 capsules (2 g total) by mouth 2 (two) times daily. 360 capsule 1   Oxcarbazepine  (TRILEPTAL ) 300 MG tablet Take 1 tablet (300 mg total) by mouth 3 (three)  times daily. 90 tablet 11   oxyCODONE -acetaminophen  (PERCOCET/ROXICET) 5-325 MG tablet Take 1 tablet by mouth every 8 (eight) hours as needed for severe pain (pain score 7-10). 90 tablet 0   predniSONE  (DELTASONE ) 10 MG tablet Take 20 mg by mouth in the morning, at noon, and at bedtime. (Patient taking differently: Take 40 mg by mouth daily.)     tizanidine  (ZANAFLEX ) 2 MG capsule Take 1 capsule (2 mg total) by mouth 3 (three) times daily as needed for muscle spasms. 90 capsule 5   traZODone  (DESYREL ) 100 MG tablet Take 1 tablet (100 mg total) by mouth at bedtime. 90 tablet 1   UNITHROID  125 MCG tablet Take 1 tablet (125 mcg total) by mouth daily before breakfast. 90 tablet 1   No current facility-administered medications for this visit.    PAST MEDICAL HISTORY: Past Medical History:  Diagnosis Date   Allergy     Anxiety    Arthritis    Cataract    Community acquired pneumonia 08/28/2023   Depression    Detached retina    Heart murmur    History of bilateral cataract extraction    Hyperlipidemia    Hypothyroidism    Low back pain    Panic attack    Panic attacks    Pneumonia of right upper lobe due to infectious organism 08/18/2023    PAST SURGICAL HISTORY: Past Surgical History:  Procedure Laterality Date   CATARACT EXTRACTION     EYE SURGERY     IR FLUORO GUIDE CV LINE RIGHT  10/29/2023   IR US  GUIDE VASC ACCESS RIGHT  10/29/2023    FAMILY HISTORY: Family History  Problem Relation Age of Onset   Breast cancer Mother    Hypertension Father    Heart disease Father    Asthma Father    Emphysema Father        smoker for 45 years   Diabetes Brother    Hyperlipidemia Brother     SOCIAL HISTORY: Social History   Socioeconomic History   Marital status: Married    Spouse name: Not on file   Number of children: 0   Years of education: Not on file   Highest education level: Not on file  Occupational History   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  Tobacco Use   Smoking status: Never    Passive exposure: Past   Smokeless tobacco: Never   Tobacco comments:    As a teenager  Vaping Use   Vaping status: Never Used  Substance and Sexual Activity   Alcohol use: Not Currently   Drug use: No   Sexual activity: Yes    Birth control/protection: Condom  Other Topics Concern   Not on file  Social  History Narrative   Caffienated drinks-no   Seat belt use often-yes   Regular Exercise-yes   Smoke alarm in the home-yes   Firearms/guns in the home-no   History of physical abuse-no               Social Drivers of Health   Financial Resource Strain: Patient Declined (05/27/2023)   Overall Financial Resource Strain (CARDIA)    Difficulty of Paying Living Expenses: Patient declined  Food Insecurity: No Food Insecurity (10/29/2023)   Hunger Vital Sign    Worried About Running Out of Food in the Last Year: Never true    Ran Out of Food in the Last Year: Never true  Transportation Needs: No Transportation Needs (10/29/2023)   PRAPARE - Administrator, Civil Service (Medical): No    Lack of Transportation (Non-Medical): No  Physical Activity: Insufficiently Active (05/27/2023)   Exercise Vital Sign    Days of Exercise per Week: 4 days    Minutes of Exercise per Session: 30 min  Stress: Stress Concern Present (05/27/2023)   [employer] of Occupational Health - Occupational Stress Questionnaire    Feeling of Stress : Rather much  Social Connections: Unknown (05/27/2023)   Social Connection and Isolation Panel    Frequency of Communication with Friends and Family: Patient declined    Frequency of Social Gatherings with Friends and Family: Once a week    Attends Religious Services: More than 4 times per year    Active Member of [employer] or Organizations: Yes    Attends Banker Meetings: More than 4 times per year    Marital Status: Patient declined  Intimate Partner Violence: Not At Risk (10/29/2023)    Humiliation, Afraid, Rape, and Kick questionnaire    Fear of Current or Ex-Partner: No    Emotionally Abused: No    Physically Abused: No    Sexually Abused: No      Modena Callander, M.D. Ph.D.  [facility] [address], [address] [place], [place] [address] Ph: [phone] Fax: [phone]  CC:  Joshua Debby CROME, MD [address] [place],  [place] [address]  Joshua Debby CROME, MD

## 2024-06-16 NOTE — Progress Notes (Signed)
 This encounter was created in error - please disregard.

## 2024-06-16 NOTE — Telephone Encounter (Signed)
 Referral for Neurology faxed to  [facility]   [facility]  Phone: [phone] Fax : [phone]

## 2024-06-16 NOTE — Patient Instructions (Signed)
 Refer   [facility] [phone]

## 2024-06-16 NOTE — Telephone Encounter (Signed)
 Patient dropped off disability paperwork and advised Dr. Onita said to waive the fee. Advised the patient we needed to confirm with her and if any issue he will get a call.

## 2024-06-17 ENCOUNTER — Telehealth: Payer: Self-pay | Admitting: Neurology

## 2024-06-17 NOTE — Telephone Encounter (Signed)
 Eric(member [employer])w/ [employer] has called stating pt is 1 visit away from exceeding the allotted 23 visits allowed for a year for OT.  He is asking if a PA could be submitted for pt to be able to get additional OT the # for Eric's dept is [phone] pt can be reached at [phone]

## 2024-06-17 NOTE — Telephone Encounter (Signed)
 Cld number left by Camellia at [facility] and spoke with Rylee P and attempted to give her the information for [facility] as the therapy provider for OT and who will need to do the authorization. Rylee stated she could not take that information and we would need to wait on Eric to call back.

## 2024-06-20 ENCOUNTER — Telehealth: Payer: Self-pay | Admitting: *Deleted

## 2024-06-20 NOTE — Progress Notes (Unsigned)
 Care Guide Pharmacy Note  06/20/2024 Name: KIMSEY DEMAREE MRN: [id number] DOB: 1966-09-10  Referred By: Joshua Debby LITTIE, MD Reason for referral: Call Attempt #1 and Complex Care Management (Outreach to schedule referral with pharmacist )   LORING LISKEY is a 58 y.o. year old male who is a primary care patient of Joshua Debby LITTIE, MD.  Velinda LITTIE Novak was referred to the pharmacist for assistance related to: HTN  An unsuccessful telephone outreach was attempted today to contact the patient who was referred to the pharmacy team for assistance with medication management. Additional attempts will be made to contact the patient.  [name], CMA   [facility], [facility] Guide Direct Dial: [phone]  Fax: [phone] Website: [email or web address]

## 2024-06-21 ENCOUNTER — Other Ambulatory Visit

## 2024-06-21 ENCOUNTER — Encounter: Payer: Self-pay | Admitting: Neurology

## 2024-06-21 DIAGNOSIS — Z006 Encounter for examination for normal comparison and control in clinical research program: Secondary | ICD-10-CM

## 2024-06-21 NOTE — Progress Notes (Unsigned)
 Care Guide Pharmacy Note  06/21/2024 Name: [name] MRN: [id number] DOB: January 25, 1966  Referred By: Joshua Debby LITTIE, MD Reason for referral: Call Attempt #1 and Complex Care Management (Outreach to schedule referral with pharmacist )   [name] is a 58 y.o. year old male who is a primary care patient of Joshua Debby LITTIE, MD.  Velinda LITTIE Novak was referred to the pharmacist for assistance related to: HTN  A second unsuccessful telephone outreach was attempted today to contact the patient who was referred to the pharmacy team for assistance with medication management. Additional attempts will be made to contact the patient.  [name], CMA   [facility], [facility] Guide Direct Dial: [phone]  Fax: [phone] Website: [email or web address]

## 2024-06-22 NOTE — Progress Notes (Signed)
 Care Guide Pharmacy Note  06/22/2024 Name: Jonathan Luna MRN: [id number] DOB: 1966-03-10  Referred By: Joshua Debby LITTIE, MD Reason for referral: Call Attempt #1 and Complex Care Management (Outreach to schedule referral with pharmacist )   [name] is a 58 y.o. year old male who is a primary care patient of Joshua Debby LITTIE, MD.  Jonathan Luna was referred to the pharmacist for assistance related to: HTN  A third unsuccessful telephone outreach was attempted today to contact the patient who was referred to the pharmacy team for assistance with medication management. The Population Health team is pleased to engage with this patient at any time in the future upon receipt of referral and should he/she be interested in assistance from the Population Health team.  [name], CMA [facility]  [facility], [facility] Guide Direct Dial: [phone]  Fax: [phone] Website: [email or web address]

## 2024-06-24 ENCOUNTER — Telehealth: Payer: Self-pay | Admitting: Radiology

## 2024-06-24 NOTE — Telephone Encounter (Signed)
This has been noted.

## 2024-06-24 NOTE — Telephone Encounter (Signed)
 Copied from CRM #[id number]. Topic: General - Other >> Jun 24, 2024  1:42 PM Berneda FALCON wrote: Reason for CRM: Patient is returning phone call to [name], got a call while on the phone with me and this was [name] returning the call back to him. Pt would like it noted that he was unhappy with how things were playing out with this getting this scan done and the fact he had to make so many phone calls to get it. He states this is a negative patient experience.

## 2024-06-28 ENCOUNTER — Telehealth: Payer: Self-pay | Admitting: Neurology

## 2024-06-28 NOTE — Telephone Encounter (Signed)
 Received completed FMLA ppw for pt, called and LVM for pt let him know it is up front for him to pick up. Form states to return to patient

## 2024-06-28 NOTE — Telephone Encounter (Signed)
 Called and spoke to [employer] [facility] who stated that the visits are hard maxed at 23 and no extra will be given

## 2024-07-01 LAB — GENECONNECT MOLECULAR SCREEN: Genetic Analysis Overall Interpretation: NEGATIVE

## 2024-07-05 ENCOUNTER — Other Ambulatory Visit: Payer: Self-pay | Admitting: Internal Medicine

## 2024-07-05 ENCOUNTER — Other Ambulatory Visit: Payer: Self-pay | Admitting: Neurology

## 2024-07-05 DIAGNOSIS — E781 Pure hyperglyceridemia: Secondary | ICD-10-CM

## 2024-07-05 DIAGNOSIS — G6181 Chronic inflammatory demyelinating polyneuritis: Secondary | ICD-10-CM

## 2024-07-05 DIAGNOSIS — I10 Essential (primary) hypertension: Secondary | ICD-10-CM

## 2024-07-05 DIAGNOSIS — F331 Major depressive disorder, recurrent, moderate: Secondary | ICD-10-CM

## 2024-07-05 DIAGNOSIS — R000 Tachycardia, unspecified: Secondary | ICD-10-CM

## 2024-07-05 DIAGNOSIS — F410 Panic disorder [episodic paroxysmal anxiety] without agoraphobia: Secondary | ICD-10-CM

## 2024-07-07 ENCOUNTER — Other Ambulatory Visit: Payer: Self-pay | Admitting: Internal Medicine

## 2024-07-07 DIAGNOSIS — G894 Chronic pain syndrome: Secondary | ICD-10-CM

## 2024-07-07 DIAGNOSIS — M51369 Other intervertebral disc degeneration, lumbar region without mention of lumbar back pain or lower extremity pain: Secondary | ICD-10-CM

## 2024-07-07 DIAGNOSIS — M5030 Other cervical disc degeneration, unspecified cervical region: Secondary | ICD-10-CM

## 2024-07-07 DIAGNOSIS — G8929 Other chronic pain: Secondary | ICD-10-CM

## 2024-07-08 ENCOUNTER — Telehealth: Payer: Self-pay

## 2024-07-08 ENCOUNTER — Other Ambulatory Visit: Payer: Self-pay

## 2024-07-08 DIAGNOSIS — M5030 Other cervical disc degeneration, unspecified cervical region: Secondary | ICD-10-CM

## 2024-07-08 DIAGNOSIS — G8929 Other chronic pain: Secondary | ICD-10-CM

## 2024-07-08 DIAGNOSIS — G894 Chronic pain syndrome: Secondary | ICD-10-CM

## 2024-07-08 DIAGNOSIS — M51369 Other intervertebral disc degeneration, lumbar region without mention of lumbar back pain or lower extremity pain: Secondary | ICD-10-CM

## 2024-07-08 NOTE — Telephone Encounter (Signed)
 Copied from CRM [id number]. Topic: Clinical - Medication Question >> Jul 08, 2024 12:26 PM [place] J wrote: Reason for CRM: Patient was calling to verify if his oxyCODONE -acetaminophen  (PERCOCET/ROXICET) 5-325 MG tablet was successfully sent in by [facility]. I was able to confirm the refill request, but the patient also wanted to notify Dr. Joshua that he will be completely out of medication this Sunday and is wondering if he could get an approval o his oxyCODONE -acetaminophen  (PERCOCET/ROXICET) 5-325 MG tablet either today or Monday. Patient has been informed of the 3 business day turn around time.

## 2024-07-08 NOTE — Telephone Encounter (Signed)
 Medication refill request has been sent to Dr. Yetta Barre

## 2024-07-09 MED ORDER — OXYCODONE-ACETAMINOPHEN 5-325 MG PO TABS: 1.0000 | ORAL_TABLET | Freq: Three times a day (TID) | ORAL | 0 refills | Status: DC | PRN

## 2024-07-15 ENCOUNTER — Other Ambulatory Visit: Payer: Self-pay | Admitting: Internal Medicine

## 2024-07-15 DIAGNOSIS — E781 Pure hyperglyceridemia: Secondary | ICD-10-CM

## 2024-07-15 DIAGNOSIS — I10 Essential (primary) hypertension: Secondary | ICD-10-CM

## 2024-07-15 DIAGNOSIS — R000 Tachycardia, unspecified: Secondary | ICD-10-CM

## 2024-07-15 MED ORDER — OMEGA-3-ACID ETHYL ESTERS 1 G PO CAPS: 2.0000 g | ORAL_CAPSULE | Freq: Two times a day (BID) | ORAL | 1 refills | Status: AC

## 2024-07-15 MED ORDER — METOPROLOL SUCCINATE ER 25 MG PO TB24: 25.0000 mg | ORAL_TABLET | Freq: Every day | ORAL | 0 refills | Status: AC

## 2024-07-27 ENCOUNTER — Encounter: Payer: Self-pay | Admitting: Neurology

## 2024-07-27 ENCOUNTER — Other Ambulatory Visit: Payer: Self-pay | Admitting: Internal Medicine

## 2024-07-27 DIAGNOSIS — G6181 Chronic inflammatory demyelinating polyneuritis: Secondary | ICD-10-CM

## 2024-07-27 DIAGNOSIS — F410 Panic disorder [episodic paroxysmal anxiety] without agoraphobia: Secondary | ICD-10-CM

## 2024-07-27 DIAGNOSIS — F331 Major depressive disorder, recurrent, moderate: Secondary | ICD-10-CM

## 2024-08-02 ENCOUNTER — Other Ambulatory Visit: Payer: Self-pay | Admitting: Neurology

## 2024-08-03 ENCOUNTER — Other Ambulatory Visit: Payer: Self-pay | Admitting: Internal Medicine

## 2024-08-08 ENCOUNTER — Other Ambulatory Visit: Payer: Self-pay | Admitting: Internal Medicine

## 2024-08-08 DIAGNOSIS — M545 Low back pain, unspecified: Secondary | ICD-10-CM

## 2024-08-08 DIAGNOSIS — M5030 Other cervical disc degeneration, unspecified cervical region: Secondary | ICD-10-CM

## 2024-08-08 DIAGNOSIS — M51369 Other intervertebral disc degeneration, lumbar region without mention of lumbar back pain or lower extremity pain: Secondary | ICD-10-CM

## 2024-08-08 DIAGNOSIS — G894 Chronic pain syndrome: Secondary | ICD-10-CM

## 2024-08-09 ENCOUNTER — Encounter: Payer: Self-pay | Admitting: Neurology

## 2024-08-09 NOTE — Telephone Encounter (Signed)
 Last labs checked 04/13/24: CBC, IgG, IgA, IgM, Cd20. Typically gets labs q 6 months. Next f/u scheduled for 12/2024.   However, he is scheduled with [facility] for second opinion 08/2024 per pt.

## 2024-08-10 ENCOUNTER — Telehealth: Payer: Self-pay

## 2024-08-10 NOTE — Telephone Encounter (Signed)
 Copied from CRM [id number]. Topic: Clinical - Medication Question >> Aug 10, 2024 10:34 AM Deaijah H wrote: Reason for CRM: patient called in to follow up on prescription advised still penidng can possibly take 3 business days. Would like to remind Dr. Joshua of script request for Oxycodone 

## 2024-08-10 NOTE — Telephone Encounter (Signed)
 Please order CBC with diff, CMP, CD19/20, Ig G/M/A level

## 2024-08-11 ENCOUNTER — Other Ambulatory Visit: Payer: Self-pay | Admitting: *Deleted

## 2024-08-11 ENCOUNTER — Other Ambulatory Visit: Payer: Self-pay

## 2024-08-11 ENCOUNTER — Telehealth: Payer: Self-pay | Admitting: Neurology

## 2024-08-11 DIAGNOSIS — Z79899 Other long term (current) drug therapy: Secondary | ICD-10-CM

## 2024-08-11 DIAGNOSIS — G894 Chronic pain syndrome: Secondary | ICD-10-CM

## 2024-08-11 DIAGNOSIS — M5030 Other cervical disc degeneration, unspecified cervical region: Secondary | ICD-10-CM

## 2024-08-11 DIAGNOSIS — G6181 Chronic inflammatory demyelinating polyneuritis: Secondary | ICD-10-CM

## 2024-08-11 DIAGNOSIS — G8929 Other chronic pain: Secondary | ICD-10-CM

## 2024-08-11 DIAGNOSIS — M51369 Other intervertebral disc degeneration, lumbar region without mention of lumbar back pain or lower extremity pain: Secondary | ICD-10-CM

## 2024-08-11 MED ORDER — OXYCODONE-ACETAMINOPHEN 5-325 MG PO TABS: 1.0000 | ORAL_TABLET | Freq: Three times a day (TID) | ORAL | 0 refills | Status: DC | PRN

## 2024-08-11 NOTE — Telephone Encounter (Signed)
 Medication refill has been sent to Dr. Yetta Barre

## 2024-08-11 NOTE — Telephone Encounter (Signed)
 Jordan from [facility] called needing to discuss lab results with nurse. Please advise.

## 2024-08-11 NOTE — Telephone Encounter (Signed)
 Called [facility]/Adam at [phone]. He confirmed they received lab orders. They will complete unless they speak with pt and he wants to go via [facility] near him. Aware pt sent us  request to have labs through them.  Last infusion of Ruxience 1000mg , dose one 07/29/2024. Next infusion date due this weekend. They will plan to have labs collected then.  Question about IVIG: how long should pt continue on this therapy? Have order on file good for a year. Aware to continue unless they hear from us  to d/c. Relayed pt also scheduled for second opinion via [facility] 08/2024.

## 2024-08-12 ENCOUNTER — Other Ambulatory Visit: Payer: Self-pay | Admitting: Neurology

## 2024-08-13 ENCOUNTER — Ambulatory Visit (HOSPITAL_BASED_OUTPATIENT_CLINIC_OR_DEPARTMENT_OTHER)
Admission: RE | Admit: 2024-08-13 | Discharge: 2024-08-13 | Disposition: A | Source: Ambulatory Visit | Attending: Internal Medicine | Admitting: Internal Medicine

## 2024-08-13 DIAGNOSIS — F1920 Other psychoactive substance dependence, uncomplicated: Secondary | ICD-10-CM | POA: Diagnosis present

## 2024-08-15 ENCOUNTER — Other Ambulatory Visit: Payer: Self-pay | Admitting: Internal Medicine

## 2024-08-15 DIAGNOSIS — M810 Age-related osteoporosis without current pathological fracture: Secondary | ICD-10-CM | POA: Insufficient documentation

## 2024-08-15 DIAGNOSIS — M818 Other osteoporosis without current pathological fracture: Secondary | ICD-10-CM | POA: Insufficient documentation

## 2024-08-16 LAB — CBC WITH DIFFERENTIAL/PLATELET
Basophils Absolute: 0 x10E3/uL (ref 0.0–0.2)
Basos: 1 %
EOS (ABSOLUTE): 0 x10E3/uL (ref 0.0–0.4)
Eos: 0 %
Hematocrit: 43.3 % (ref 37.5–51.0)
Hemoglobin: 14.5 g/dL (ref 13.0–17.7)
Immature Grans (Abs): 0 x10E3/uL (ref 0.0–0.1)
Immature Granulocytes: 0 %
Lymphocytes Absolute: 1.2 x10E3/uL (ref 0.7–3.1)
Lymphs: 19 %
MCH: 30.9 pg (ref 26.6–33.0)
MCHC: 33.5 g/dL (ref 31.5–35.7)
MCV: 92 fL (ref 79–97)
Monocytes Absolute: 0.5 x10E3/uL (ref 0.1–0.9)
Monocytes: 7 %
Neutrophils Absolute: 4.7 x10E3/uL (ref 1.4–7.0)
Neutrophils: 72 %
Platelets: 377 x10E3/uL (ref 150–450)
RBC: 4.7 x10E6/uL (ref 4.14–5.80)
RDW: 13.3 % (ref 11.6–15.4)
WBC: 6.4 x10E3/uL (ref 3.4–10.8)

## 2024-08-16 LAB — CD20 B CELLS
% CD19-B Cells: 0 % — AB (ref 4.6–22.1)
% CD20-B Cells: 0 % — AB (ref 5.0–22.3)

## 2024-08-17 ENCOUNTER — Ambulatory Visit: Payer: Self-pay | Admitting: Neurology

## 2024-08-29 ENCOUNTER — Ambulatory Visit: Admitting: Physician Assistant

## 2024-08-29 ENCOUNTER — Encounter: Payer: Self-pay | Admitting: Physician Assistant

## 2024-08-29 DIAGNOSIS — M810 Age-related osteoporosis without current pathological fracture: Secondary | ICD-10-CM

## 2024-08-29 MED ORDER — ALENDRONATE SODIUM 70 MG PO TABS: 70.0000 mg | ORAL_TABLET | ORAL | 6 refills | Status: DC

## 2024-08-29 MED ORDER — ALENDRONATE SODIUM 70 MG PO TABS: 70.0000 mg | ORAL_TABLET | ORAL | 1 refills | Status: AC

## 2024-08-29 NOTE — Progress Notes (Signed)
 Office Visit Note   Patient: Jonathan Luna           Date of Birth: [birth year]           MRN: [id number] Visit Date: 08/29/2024              Requested by: Joshua Debby LITTIE, MD [address] [place],  [place] [address] PCP: Joshua Debby LITTIE, MD   Assessment & Plan: Visit Diagnoses:  1. 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  He was referred from Dr. Joshua for evaluation of osteoporosis after a most recent bone density scan demonstrated a T-score of -2.9.  He has never taken medication for osteoporosis in the past.  No specific history of fractures.  No history of heart disease.  He has been on a high dose of steroids but is weaning this down.  No history or family history of cancers no history of kidney disease or ulcers.  No history of reflux or seizures.  He does not really track his calcium  and takes vitamin D3 every a.m. he has not had a vitamin D  drawn recently that I can find.  He is not a smoker does not drink and tries to do a little exercise every day though he is limited because of his neuropathy.  He has had no major dental work is unsure of his family history I spent 45 minutes reviewing his chart and discussing options with him.  I would like him to be more mindful of him how much calcium  he is getting in his diet.  With regard to exercise he is somewhat limited but I would like him to try to try and do some resistance type exercises.  We talked about different therapies.  The only anabolic medication would be something like Tymlos but because of his vision challenges and his wife's challenges he does not thinks would be a good option for them.  We talked about Prolia which I think would be a good option though he is leery of having to take it for the rest of his life.  We talked about Fosamax certainly there is no indicators that he could not try this first.  I  will call this in for him today could have a repeat bone density in a year.  He is going to be sure that he is getting between 12 and 1500 mg of calcium  daily.  Side effects of medication was discussed with he and his wife.  All questions were answered  Follow-Up Instructions: Return if symptoms worsen or fail to improve.   Orders:  No orders of the defined types were placed in this encounter.  No orders of the defined types were placed in this encounter.     Procedures: No procedures performed   Clinical Data: No additional findings.   Subjective: No chief complaint on file.   HPI pleasant 58 year old gentleman referred by Dr. Joshua for evaluation of osteoporosis.  Review of Systems  All other systems reviewed and are negative.    Objective: Vital Signs: There were no vitals taken for this visit.  Physical Exam Constitutional:      Appearance: Normal appearance.  Pulmonary:     Effort: Pulmonary effort is normal.  Skin:    General: Skin is warm and dry.  Neurological:     General: No focal deficit present.  Mental Status: He is alert and oriented to person, place, and time.  Psychiatric:        Mood and Affect: Mood normal.        Behavior: Behavior normal.       Specialty Comments:  No specialty comments available.  Imaging: No results found.   PMFS History: Patient Active Problem List   Diagnosis Date Noted   Age-related osteoporosis without current pathological fracture 08/15/2024   Left low back pain 06/07/2024   DDD (degenerative disc disease), cervical 06/07/2024   Steroid dependence (HCC) 06/07/2024   Gait abnormality 02/25/2024   Neuropathic pain 02/25/2024   Moderate episode of recurrent major depressive disorder (HCC) 01/19/2024   Hypertriglyceridemia, essential 01/19/2024   Immunization due 01/19/2024   CIDP (chronic inflammatory demyelinating polyneuropathy) (HCC) 10/29/2023   Vitamin D  deficiency disease 09/08/2018   Essential  hypertension 09/07/2018   Long-term current use of opiate analgesic 04/07/2018   Encounter for general adult medical examination with abnormal findings 08/03/2017   Allergic rhinitis 12/13/2012   Chronic pain disorder 09/29/2012   Irritable bowel syndrome 10/16/2010   DDD (degenerative disc disease), lumbar 04/09/2010   Hypothyroidism 05/03/2009   Hyperlipidemia with target LDL less than 130 05/03/2009   Panic anxiety syndrome 05/03/2009   Past Medical History:  Diagnosis Date   Allergy     Anxiety    Arthritis    Cataract    Community acquired pneumonia 08/28/2023   Depression    Detached retina    Heart murmur    History of bilateral cataract extraction    Hyperlipidemia    Hypothyroidism    Low back pain    Panic attack    Panic attacks    Pneumonia of right upper lobe due to infectious organism 08/18/2023    Family History  Problem Relation Age of Onset   Breast cancer Mother    Hypertension Father    Heart disease Father    Asthma Father    Emphysema Father        smoker for 45 years   Diabetes Brother    Hyperlipidemia Brother     Past Surgical History:  Procedure Laterality Date   CATARACT EXTRACTION     EYE SURGERY     IR FLUORO GUIDE CV LINE RIGHT  10/29/2023   IR US  GUIDE VASC ACCESS RIGHT  10/29/2023   Social History   Occupational History   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

## 2024-08-29 NOTE — Addendum Note (Signed)
 Addended by: CLEMETINE RONAL DRAGON on: 08/29/2024 02:28 PM   Modules accepted: Orders

## 2024-08-29 NOTE — Addendum Note (Signed)
 Addended by: MARCINE HUSBAND T on: 08/29/2024 02:13 PM   Modules accepted: Orders

## 2024-09-06 ENCOUNTER — Encounter: Payer: Self-pay | Admitting: Internal Medicine

## 2024-09-06 ENCOUNTER — Ambulatory Visit: Payer: Self-pay | Admitting: Internal Medicine

## 2024-09-06 ENCOUNTER — Ambulatory Visit: Admitting: Internal Medicine

## 2024-09-06 VITALS — BP 132/86 | HR 100 | Temp 99.1°F | Resp 16 | Ht 67.5 in | Wt 168.2 lb

## 2024-09-06 DIAGNOSIS — F410 Panic disorder [episodic paroxysmal anxiety] without agoraphobia: Secondary | ICD-10-CM | POA: Diagnosis not present

## 2024-09-06 DIAGNOSIS — E039 Hypothyroidism, unspecified: Secondary | ICD-10-CM | POA: Diagnosis not present

## 2024-09-06 DIAGNOSIS — E559 Vitamin D deficiency, unspecified: Secondary | ICD-10-CM | POA: Diagnosis not present

## 2024-09-06 DIAGNOSIS — M810 Age-related osteoporosis without current pathological fracture: Secondary | ICD-10-CM | POA: Diagnosis not present

## 2024-09-06 DIAGNOSIS — F331 Major depressive disorder, recurrent, moderate: Secondary | ICD-10-CM | POA: Diagnosis not present

## 2024-09-06 DIAGNOSIS — I10 Essential (primary) hypertension: Secondary | ICD-10-CM

## 2024-09-06 DIAGNOSIS — G6181 Chronic inflammatory demyelinating polyneuritis: Secondary | ICD-10-CM

## 2024-09-06 LAB — BASIC METABOLIC PANEL WITH GFR
BUN: 13 mg/dL (ref 6–23)
CO2: 28 meq/L (ref 19–32)
Calcium: 10 mg/dL (ref 8.4–10.5)
Chloride: 94 meq/L — ABNORMAL LOW (ref 96–112)
Creatinine, Ser: 0.68 mg/dL (ref 0.40–1.50)
GFR: 102.56 mL/min (ref >60.00)
Glucose, Bld: 106 mg/dL — ABNORMAL HIGH (ref 70–99)
Potassium: 4 meq/L (ref 3.5–5.1)
Sodium: 130 meq/L — ABNORMAL LOW (ref 135–145)

## 2024-09-06 LAB — PHOSPHORUS: Phosphorus: 4.1 mg/dL (ref 2.3–4.6)

## 2024-09-06 LAB — VITAMIN D 25 HYDROXY (VIT D DEFICIENCY, FRACTURES): VITD: 45.03 ng/mL (ref 30.00–100.00)

## 2024-09-06 LAB — TSH: TSH: 4.96 u[IU]/mL (ref 0.35–5.50)

## 2024-09-06 MED ORDER — TRAZODONE HCL 100 MG PO TABS: 100.0000 mg | ORAL_TABLET | Freq: Every day | ORAL | 1 refills | Status: AC

## 2024-09-06 NOTE — Patient Instructions (Signed)
Palpitations Palpitations are feelings that your heartbeat is irregular or is faster than normal. It may feel like your heart is fluttering or skipping a beat. Palpitations may be caused by many things, including smoking, caffeine, alcohol, stress, and certain medicines or drugs. Most causes of palpitations are not serious.  However, some palpitations can be a sign of a serious problem. Further tests and a thorough medical history will be done to find the cause of your palpitations. Your provider may order tests such as an ECG, labs, an echocardiogram, or an ambulatory continuous ECG monitor. Follow these instructions at home: Pay attention to any changes in your symptoms. Let your health care provider know about them. Take these actions to help manage your symptoms: Eating and drinking Follow instructions from your health care provider about eating or drinking restrictions. You may need to avoid foods and drinks that may cause palpitations. These may include: Caffeinated coffee, tea, soft drinks, and energy drinks. Chocolate. Alcohol. Diet pills. Lifestyle     Take steps to reduce your stress and anxiety. Things that can help you relax include: Yoga. Mind-body activities, such as deep breathing, meditation, or using words and images to create positive thoughts (guided imagery). Physical activity, such as swimming, jogging, or walking. Tell your health care provider if your palpitations increase with activity. If you have chest pain or shortness of breath with activity, do not continue the activity until you are seen by your health care provider. Biofeedback. This is a method that helps you learn to use your mind to control things in your body, such as your heartbeat. Get plenty of rest and sleep. Keep a regular bed time. Do not use drugs, including cocaine or ecstasy. Do not use marijuana. Do not use any products that contain nicotine or tobacco. These products include cigarettes, chewing  tobacco, and vaping devices, such as e-cigarettes. If you need help quitting, ask your health care provider. General instructions Take over-the-counter and prescription medicines only as told by your health care provider. Keep all follow-up visits. This is important. These may include visits for further testing if palpitations do not go away or get worse. Contact a health care provider if: You continue to have a fast or irregular heartbeat for a long period of time. You notice that your palpitations occur more often. Get help right away if: You have chest pain or shortness of breath. You have a severe headache. You feel dizzy or you faint. These symptoms may represent a serious problem that is an emergency. Do not wait to see if the symptoms will go away. Get medical help right away. Call your local emergency services (911 in the [place] Do not drive yourself to the hospital. Summary Palpitations are feelings that your heartbeat is irregular or is faster than normal. It may feel like your heart is fluttering or skipping a beat. Palpitations may be caused by many things, including smoking, caffeine, alcohol, stress, certain medicines, and drugs. Further tests and a thorough medical history may be done to find the cause of your palpitations. Get help right away if you faint or have chest pain, shortness of breath, severe headache, or dizziness. This information is not intended to replace advice given to you by your health care provider. Make sure you discuss any questions you have with your health care provider. Document Revised: 02/20/2021 Document Reviewed: 02/20/2021 Elsevier Patient Education  2024 [employer].

## 2024-09-06 NOTE — Progress Notes (Signed)
 Subjective:  Patient ID: Jonathan Luna, male    DOB: 1966-06-14  Age: 58 y.o. MRN: [id number]  CC: Follow-up (F/u for pain medications. Asking for vitamin d  blood work done. )   HPI Jonathan Luna presents for f/up   Discussed the use of AI scribe software for clinical note transcription with the patient, who gave verbal consent to proceed.  History of Present Illness Jonathan Luna is a 58 year old male who presents with elevated heart rate and lightheadedness following rituximab infusion.  He has experienced lightheadedness since receiving rituximab on August 19, 2024. His heart rate, typically in the fifties or sixties, began to increase around this time. During IVIG therapy sessions, where his heart rate is monitored every fifteen minutes, he feels his heart racing. He feels flushed and lightheaded but has no chest pain or shortness of breath. No dizziness to the point of passing out.  He uses an app to monitor his heart rate and blood pressure, which showed a heart rate as low as 49 on July 21, 2024. He is concerned about the variability in his heart rate, noting that it has been mostly low but has increased since the rituximab infusion. He attributes some of the elevated heart rate to stress, particularly related to preparing for appointments and the holidays.  He also experiences diarrhea after the rituximab infusion, for which he took Imodium. He wants to return to work, indicating that the stress of the holidays and his current health issues are frustrating for him.     Outpatient Medications Prior to Visit  Medication Sig Dispense Refill   alendronate  (FOSAMAX ) 70 MG tablet Take 1 tablet (70 mg total) by mouth once a week. Take with a full glass of water on an empty stomach. 12 tablet 1   amLODipine  (NORVASC ) 5 MG tablet TAKE ONE TABLET BY MOUTH ONCE A DAY 90 tablet 0   aspirin  EC 81 MG tablet Take 81 mg by mouth daily. Swallow whole.     Cholecalciferol  (VITAMIN D -3) 25 MCG (1000 UT)  CAPS Take 1 capsule by mouth daily.     DULoxetine  (CYMBALTA ) 30 MG capsule Take 1 capsule (30 mg total) by mouth daily 90 capsule 0   DULoxetine  (CYMBALTA ) 60 MG capsule Take 1 capsule (60 mg total) by mouth daily. 90 capsule 3   fluticasone  (FLONASE ) 50 MCG/ACT nasal spray Place 2 sprays into both nostrils daily. 48 g 0   gabapentin  (NEURONTIN ) 300 MG capsule Take 4 capsules (1,200 mg total) by mouth 3 (three) times daily. 360 capsule 11   metoprolol  succinate (TOPROL -XL) 25 MG 24 hr tablet Take 1 tablet (25 mg total) by mouth daily. 90 tablet 0   omega-3 acid ethyl esters (LOVAZA ) 1 g capsule Take 2 capsules (2 g total) by mouth 2 (two) times daily. 360 capsule 1   Oxcarbazepine  (TRILEPTAL ) 300 MG tablet Take 1 tablet (300 mg total) by mouth 3 (three) times daily. 90 tablet 11   oxyCODONE -acetaminophen  (PERCOCET/ROXICET) 5-325 MG tablet Take 1 tablet by mouth every 8 (eight) hours as needed for severe pain (pain score 7-10). 90 tablet 0   predniSONE  (DELTASONE ) 10 MG tablet Take 3 tablets (30 mg total) by mouth daily. 90 tablet 6   tizanidine  (ZANAFLEX ) 2 MG capsule Take 1 capsule (2 mg total) by mouth 3 (three) times daily as needed for muscle spasms. 90 capsule 5   UNITHROID  125 MCG tablet Take 1 tablet (125 mcg total) by mouth daily before breakfast.  90 tablet 1   traZODone  (DESYREL ) 100 MG tablet Take 1 tablet (100 mg total) by mouth at bedtime. 90 tablet 1   No facility-administered medications prior to visit.    ROS Review of Systems  Constitutional:  Positive for fatigue and unexpected weight change (wt loss). Negative for appetite change, chills and diaphoresis.  HENT:  Negative for sore throat and trouble swallowing.   Respiratory: Negative.  Negative for cough, chest tightness, shortness of breath and wheezing.   Cardiovascular:  Positive for palpitations. Negative for chest pain and leg swelling.  Gastrointestinal:  Negative for abdominal pain, constipation, diarrhea, nausea and  vomiting.  Genitourinary: Negative.  Negative for difficulty urinating and hematuria.  Musculoskeletal:  Positive for back pain.  Skin: Negative.   Neurological:  Positive for dizziness, weakness and numbness.  Hematological:  Negative for adenopathy. Bruises/bleeds easily.  Psychiatric/Behavioral:  Positive for decreased concentration. Negative for behavioral problems, confusion, dysphoric mood, sleep disturbance and suicidal ideas. The patient is nervous/anxious.     Objective:  BP 132/86 (BP Location: Right Arm, Patient Position: Sitting, Cuff Size: Normal)   Pulse 100   Temp 99.1 F (37.3 Jonathan) (Oral)   Resp 16   Ht 5' 7.5 (1.715 m)   Wt 168 lb 3.2 oz (76.3 kg)   SpO2 97%   BMI 25.96 kg/m   BP Readings from Last 3 Encounters:  09/06/24 132/86  06/16/24 136/82  06/07/24 130/88    Wt Readings from Last 3 Encounters:  09/06/24 168 lb 3.2 oz (76.3 kg)  06/16/24 189 lb (85.7 kg)  06/07/24 187 lb 6.4 oz (85 kg)    Physical Exam Vitals reviewed.  Constitutional:      Appearance: Normal appearance.  HENT:     Nose: Nose normal.     Mouth/Throat:     Mouth: Mucous membranes are moist.  Eyes:     General: No scleral icterus.    Conjunctiva/sclera: Conjunctivae normal.  Cardiovascular:     Rate and Rhythm: Regular rhythm. Tachycardia present.     Heart sounds: No murmur heard.    No friction rub. No gallop.     Comments: EKG--- NSR, 99 bpm No LVH, Q waves, or ST/T wave changes  Pulmonary:     Effort: Pulmonary effort is normal.     Breath sounds: No stridor. No wheezing, rhonchi or rales.  Abdominal:     General: Abdomen is flat.     Palpations: There is no mass.     Tenderness: There is no abdominal tenderness. There is no guarding.     Hernia: No hernia is present.  Musculoskeletal:        General: Normal range of motion.     Cervical back: Neck supple.     Right lower leg: No edema.     Left lower leg: No edema.  Lymphadenopathy:     Cervical: No cervical  adenopathy.  Neurological:     Mental Status: He is alert. Mental status is at baseline.     Motor: Weakness present.     Coordination: Coordination abnormal.     Gait: Gait abnormal.     Deep Tendon Reflexes: Reflexes abnormal.  Psychiatric:        Attention and Perception: He is inattentive.        Mood and Affect: Mood is anxious. Affect is not flat.        Speech: Speech normal.        Behavior: Behavior normal.  Thought Content: Thought content normal.        Cognition and Memory: Cognition normal.     Lab Results  Component Value Date   WBC 6.4 08/12/2024   HGB 14.5 08/12/2024   HCT 43.3 08/12/2024   PLT 377 08/12/2024   GLUCOSE 106 (H) 09/06/2024   CHOL 217 (H) 06/07/2024   TRIG 143.0 06/07/2024   HDL 52.50 06/07/2024   LDLDIRECT 150.0 04/07/2022   LDLCALC 136 (H) 06/07/2024   ALT 24 04/13/2024   AST 26 04/13/2024   NA 130 (L) 09/06/2024   K 4.0 09/06/2024   CL 94 (L) 09/06/2024   CREATININE 0.68 09/06/2024   BUN 13 09/06/2024   CO2 28 09/06/2024   TSH 4.96 09/06/2024   PSA 1.51 01/19/2024   INR 1.0 08/29/2023   HGBA1C 5.2 04/13/2024    DG Bone Density Result Date: 08/15/2024 EXAM: DUAL X-RAY ABSORPTIOMETRY (DXA) FOR BONE MINERAL DENSITY 08/13/2024 11:33 am CLINICAL DATA:  58 year old Male Screening for osteoporosis Patient is or has been on glucocorticoid therapy. TECHNIQUE: An axial (e.g., hips, spine) and/or appendicular (e.g., radius) exam was performed, as appropriate, using GE Secretary/administrator at [employer]. Images are obtained for bone mineral density measurement and are not obtained for diagnostic purposes. [id number] Exclusions: None. COMPARISON:  None. FINDINGS: Scan quality: Good. LUMBAR SPINE (L1-L4): BMD (in g/cm2): 1.044 T-score: -1.6 Z-score: -1.5 LEFT FEMORAL NECK: BMD (in g/cm2): 0.751 T-score: -2.5 Z-score: -1.8 LEFT TOTAL HIP: BMD (in g/cm2): 0.683 T-score: -2.9 Z-score: -2.6 RIGHT FEMORAL NECK: BMD (in g/cm2):  0.740 T-score: -2.5 Z-score: -1.8 RIGHT TOTAL HIP: BMD (in g/cm2): 0.730 T-score: -2.6 Z-score: -2.3 FRAX 10-YEAR PROBABILITY OF FRACTURE: FRAX not reported as the lowest BMD is not in the osteopenia range. IMPRESSION: Osteoporosis based on BMD. Fracture risk is increased. Increased risk is based on low BMD. RECOMMENDATIONS: 1. All patients should optimize calcium  and vitamin D  intake. 2. Consider FDA-approved medical therapies in postmenopausal women and men aged 23 years and older, based on the following: - A hip or vertebral (clinical or morphometric) fracture - T-score less than or equal to -2.5 and secondary causes have been excluded. - Low bone mass (T-score between -1.0 and -2.5) and a 10-year probability of a hip fracture greater than or equal to 3% or a 10-year probability of a major osteoporosis-related fracture greater than or equal to 20% based on the [place] -adapted WHO algorithm. - Clinician judgment and/or patient preferences may indicate treatment for people with 10-year fracture probabilities above or below these levels 3. Patients with diagnosis of osteoporosis or at high risk for fracture should have regular bone mineral density tests. For patients eligible for Medicare, routine testing is allowed once every 2 years. The testing frequency can be increased to one year for patients who have rapidly progressing disease, those who are receiving or discontinuing medical therapy to restore bone mass, or have additional risk factors. Electronically Signed   By: Dina  Arceo M.D.   On: 08/15/2024 08:57    Assessment & Plan:   Acquired hypothyroidism- He is euthyroid. -     TSH; Future  Panic anxiety syndrome -     traZODone  HCl; Take 1 tablet (100 mg total) by mouth at bedtime.  Dispense: 90 tablet; Refill: 1  Moderate episode of recurrent major depressive disorder (HCC) -     traZODone  HCl; Take 1 tablet (100 mg total) by mouth at bedtime.  Dispense: 90 tablet; Refill: 1  Essential hypertension-  His BP is well controlled. EKG is negative for LVH. -     Basic metabolic panel with GFR; Future -     EKG 12-Lead  CIDP (chronic inflammatory demyelinating polyneuropathy) (HCC)  Vitamin D  deficiency disease -     VITAMIN D  25 Hydroxy (Vit-D Deficiency, Fractures); Future -     Phosphorus; Future  Age-related osteoporosis without current pathological fracture -     VITAMIN D  25 Hydroxy (Vit-D Deficiency, Fractures); Future -     Phosphorus; Future -     Basic metabolic panel with GFR; Future     Follow-up: Return in about 4 months (around 01/04/2025).  Debby Molt, MD

## 2024-09-12 ENCOUNTER — Other Ambulatory Visit: Payer: Self-pay | Admitting: Internal Medicine

## 2024-09-12 ENCOUNTER — Telehealth: Payer: Self-pay

## 2024-09-12 DIAGNOSIS — G894 Chronic pain syndrome: Secondary | ICD-10-CM

## 2024-09-12 DIAGNOSIS — G8929 Other chronic pain: Secondary | ICD-10-CM

## 2024-09-12 DIAGNOSIS — M51369 Other intervertebral disc degeneration, lumbar region without mention of lumbar back pain or lower extremity pain: Secondary | ICD-10-CM

## 2024-09-12 DIAGNOSIS — M5030 Other cervical disc degeneration, unspecified cervical region: Secondary | ICD-10-CM

## 2024-09-12 NOTE — Telephone Encounter (Unsigned)
 Copied from CRM #[id number]. Topic: Clinical - Prescription Issue >> Sep 12, 2024  4:51 PM Corin V wrote: Reason for CRM: Patient called and stated [facility] is out of the oxyCODONE -acetaminophen  (PERCOCET/ROXICET) 5-325 MG tablet and they cannot transfer due to it being controlled. Please resend to [facility] at [phone] [place], [place] [address] [phone]

## 2024-09-13 ENCOUNTER — Other Ambulatory Visit: Payer: Self-pay | Admitting: Internal Medicine

## 2024-09-13 DIAGNOSIS — M51369 Other intervertebral disc degeneration, lumbar region without mention of lumbar back pain or lower extremity pain: Secondary | ICD-10-CM

## 2024-09-13 DIAGNOSIS — M5030 Other cervical disc degeneration, unspecified cervical region: Secondary | ICD-10-CM

## 2024-09-13 DIAGNOSIS — G894 Chronic pain syndrome: Secondary | ICD-10-CM

## 2024-09-13 DIAGNOSIS — M545 Low back pain, unspecified: Secondary | ICD-10-CM

## 2024-09-13 MED ORDER — OXYCODONE-ACETAMINOPHEN 5-325 MG PO TABS: 1.0000 | ORAL_TABLET | Freq: Three times a day (TID) | ORAL | 0 refills | Status: DC | PRN

## 2024-10-14 ENCOUNTER — Encounter: Payer: Self-pay | Admitting: Neurology

## 2024-10-14 ENCOUNTER — Encounter: Payer: Self-pay | Admitting: Internal Medicine

## 2024-10-24 NOTE — Telephone Encounter (Signed)
 Evaluation by [facility] Neurologist Dr.Giacobbe on Oct 14 2024:  - Labs: demyelinating neuropathy panel, SPEP, IFE, kappa/lambda ratio - Interval EMG/NCS to assess for ongoing demyelination with ultrasound - continue prednisone  10mg  daily - continue weekly IVIG, decide on spacing out the dosing after EMG visit. Could consider transitioning to 70g IVIG infusion over 1 day instead of 80g administered over 2 days. - continue rituximab infusions every 6 months, if Mr. Luczak notices worsening prior to the next infusion could increase to q46month dosing - if Mr. Duchesne worsens clinically would consider nerve biopsy to rule out vasculitis  * Return in about 6 months (around 02/28/2025).  Alaina Giacobbe, M.D. Department of Neurology Division of Neuromuscular Medicine

## 2024-10-25 ENCOUNTER — Ambulatory Visit: Admitting: Neurology

## 2024-10-26 NOTE — Telephone Encounter (Signed)
 Pt is getting infusions at home thru [facility].  PA good for IVIG 01-05-2025, Rituxin PA good until 01-11-2025 per Jordan with [facility].

## 2024-10-31 ENCOUNTER — Telehealth: Payer: Self-pay | Admitting: *Deleted

## 2024-10-31 NOTE — Telephone Encounter (Signed)
 I will call [facility] to update.  Just verifying about the IVIG 70gm once every 3 wks.  Is this after he has [place]/EMG or proceed this dose now?

## 2024-10-31 NOTE — Telephone Encounter (Signed)
 Plan: (from [facility] Dr. Jeri)  - Labs: demyelinating neuropathy panel, SPEP, IFE, kappa/lambda ratio - Interval EMG/NCS to assess for ongoing demyelination with ultrasound - continue prednisone  10mg  daily - continue weekly IVIG, decide on spacing out the dosing after EMG visit. Could consider transitioning to 70g IVIG infusion over 1 day instead of 80g administered over 2 days. - continue rituximab infusions every 6 months, if [name] notices worsening prior to the next infusion could increase to q19month dosing - if [name] worsens clinically would consider nerve biopsy to rule out vasculitis  * Return in about 6 months (around 02/28/2025).    Please update the home health IVIG to 1g/kg=70g over one day every 2 weeks.

## 2024-10-31 NOTE — Telephone Encounter (Signed)
 Received notes (nursing) from [facility] for IVIG and Rutuxin infusion.  Sent to MR for scanning.

## 2024-11-02 ENCOUNTER — Telehealth: Payer: Self-pay | Admitting: *Deleted

## 2024-11-02 NOTE — Telephone Encounter (Signed)
 SABRA

## 2024-11-02 NOTE — Telephone Encounter (Signed)
 I have Patty from [facility] calling to speak to you re pt  Jonathan Luna Male, 59 y.o., July 09, 1966 [phone] MRN: [id number]  I called and spoke to Daisetta, [phone] about pts infusion .  [facility] is waiting for order.   (Weight  76.3kg).  I will fax to [facility] as soon as signed by Dr. Onita (this was in her in box to sign). Signed and order changed to 75gms per Dr. Onita on order sheet/ cosigned. Faxed to [phone] receipt confirmed by fax.

## 2024-11-02 NOTE — Telephone Encounter (Signed)
 I spoke to Dr. Onita,  verbal order ok for pt to receive 80gm daily tomorrow only (he normally has 2 day infusion  of 40gm 40gm ), since we have change  dose then he will then start 75gm the next time.  Adam B. Took the verbal order.

## 2024-11-02 NOTE — Telephone Encounter (Signed)
 Received call from [facility] Adam, relating to pt has 80gm IVIG at his home (is it ok to use this 80gm for tomorrow (one time daily dosing ONLY until his new dose 75g is due next time..  [phone].

## 2024-11-10 ENCOUNTER — Other Ambulatory Visit: Payer: Self-pay | Admitting: Internal Medicine

## 2024-11-10 DIAGNOSIS — M5030 Other cervical disc degeneration, unspecified cervical region: Secondary | ICD-10-CM

## 2024-11-10 DIAGNOSIS — M51369 Other intervertebral disc degeneration, lumbar region without mention of lumbar back pain or lower extremity pain: Secondary | ICD-10-CM

## 2024-11-10 DIAGNOSIS — M545 Low back pain, unspecified: Secondary | ICD-10-CM

## 2024-11-10 DIAGNOSIS — G894 Chronic pain syndrome: Secondary | ICD-10-CM

## 2025-01-02 ENCOUNTER — Ambulatory Visit: Admitting: Adult Health
